# Patient Record
Sex: Female | Born: 1960
Health system: Southern US, Community
[De-identification: ages and names within clinical notes are randomized; demographics above are authoritative.]

## PROBLEM LIST (undated history)

## (undated) DIAGNOSIS — F419 Anxiety disorder, unspecified: Secondary | ICD-10-CM

## (undated) DIAGNOSIS — F32A Depression, unspecified: Secondary | ICD-10-CM

## (undated) DIAGNOSIS — F329 Major depressive disorder, single episode, unspecified: Secondary | ICD-10-CM

## (undated) DIAGNOSIS — K219 Gastro-esophageal reflux disease without esophagitis: Secondary | ICD-10-CM

## (undated) DIAGNOSIS — C50911 Malignant neoplasm of unspecified site of right female breast: Secondary | ICD-10-CM

## (undated) DIAGNOSIS — C50919 Malignant neoplasm of unspecified site of unspecified female breast: Secondary | ICD-10-CM

## (undated) DIAGNOSIS — E119 Type 2 diabetes mellitus without complications: Secondary | ICD-10-CM

## (undated) DIAGNOSIS — T7840XA Allergy, unspecified, initial encounter: Secondary | ICD-10-CM

## (undated) DIAGNOSIS — C50412 Malignant neoplasm of upper-outer quadrant of left female breast: Secondary | ICD-10-CM

## (undated) DIAGNOSIS — M1990 Unspecified osteoarthritis, unspecified site: Secondary | ICD-10-CM

## (undated) DIAGNOSIS — C50912 Malignant neoplasm of unspecified site of left female breast: Secondary | ICD-10-CM

## (undated) DIAGNOSIS — I10 Essential (primary) hypertension: Secondary | ICD-10-CM

## (undated) HISTORY — DX: Malignant neoplasm of unspecified site of unspecified female breast: C50.919

## (undated) HISTORY — DX: Malignant neoplasm of upper-outer quadrant of left female breast: C50.412

## (undated) HISTORY — DX: Depression, unspecified: F32.A

## (undated) HISTORY — DX: Type 2 diabetes mellitus without complications: E11.9

## (undated) HISTORY — DX: Allergy, unspecified, initial encounter: T78.40XA

## (undated) HISTORY — DX: Unspecified osteoarthritis, unspecified site: M19.90

## (undated) HISTORY — DX: Anxiety disorder, unspecified: F41.9

## (undated) HISTORY — DX: Major depressive disorder, single episode, unspecified: F32.9

---

## 1982-08-06 HISTORY — PX: TUBAL LIGATION: SHX77

## 1988-08-06 HISTORY — PX: ABDOMINAL HYSTERECTOMY: SHX81

## 2010-08-27 ENCOUNTER — Encounter: Payer: Self-pay | Admitting: Internal Medicine

## 2013-10-13 ENCOUNTER — Emergency Department (HOSPITAL_COMMUNITY): Payer: No Typology Code available for payment source

## 2013-10-13 ENCOUNTER — Encounter (HOSPITAL_COMMUNITY): Payer: Self-pay | Admitting: Emergency Medicine

## 2013-10-13 ENCOUNTER — Emergency Department (HOSPITAL_COMMUNITY)
Admission: EM | Admit: 2013-10-13 | Discharge: 2013-10-13 | Disposition: A | Discharge status: Home / Self Care | Payer: No Typology Code available for payment source | Attending: Emergency Medicine | Admitting: Emergency Medicine

## 2013-10-13 DIAGNOSIS — S59919A Unspecified injury of unspecified forearm, initial encounter: Secondary | ICD-10-CM

## 2013-10-13 DIAGNOSIS — Z791 Long term (current) use of non-steroidal anti-inflammatories (NSAID): Secondary | ICD-10-CM | POA: Insufficient documentation

## 2013-10-13 DIAGNOSIS — Y998 Other external cause status: Secondary | ICD-10-CM | POA: Insufficient documentation

## 2013-10-13 DIAGNOSIS — Z79899 Other long term (current) drug therapy: Secondary | ICD-10-CM | POA: Insufficient documentation

## 2013-10-13 DIAGNOSIS — S6990XA Unspecified injury of unspecified wrist, hand and finger(s), initial encounter: Secondary | ICD-10-CM | POA: Insufficient documentation

## 2013-10-13 DIAGNOSIS — S069XAA Unspecified intracranial injury with loss of consciousness status unknown, initial encounter: Secondary | ICD-10-CM | POA: Insufficient documentation

## 2013-10-13 DIAGNOSIS — S59909A Unspecified injury of unspecified elbow, initial encounter: Secondary | ICD-10-CM | POA: Insufficient documentation

## 2013-10-13 DIAGNOSIS — F17200 Nicotine dependence, unspecified, uncomplicated: Secondary | ICD-10-CM | POA: Insufficient documentation

## 2013-10-13 DIAGNOSIS — I10 Essential (primary) hypertension: Secondary | ICD-10-CM | POA: Insufficient documentation

## 2013-10-13 DIAGNOSIS — S069X9A Unspecified intracranial injury with loss of consciousness of unspecified duration, initial encounter: Secondary | ICD-10-CM | POA: Insufficient documentation

## 2013-10-13 DIAGNOSIS — S199XXA Unspecified injury of neck, initial encounter: Secondary | ICD-10-CM

## 2013-10-13 DIAGNOSIS — S0993XA Unspecified injury of face, initial encounter: Secondary | ICD-10-CM | POA: Insufficient documentation

## 2013-10-13 DIAGNOSIS — S06890A Other specified intracranial injury without loss of consciousness, initial encounter: Secondary | ICD-10-CM

## 2013-10-13 DIAGNOSIS — IMO0002 Reserved for concepts with insufficient information to code with codable children: Secondary | ICD-10-CM | POA: Insufficient documentation

## 2013-10-13 DIAGNOSIS — H5789 Other specified disorders of eye and adnexa: Secondary | ICD-10-CM | POA: Insufficient documentation

## 2013-10-13 HISTORY — DX: Essential (primary) hypertension: I10

## 2013-10-13 MED ORDER — ATENOLOL-CHLORTHALIDONE 50-25 MG PO TABS: 1.0000 | ORAL_TABLET | Freq: Every day | ORAL | Status: DC

## 2013-10-13 MED ORDER — HYDROCODONE-ACETAMINOPHEN 5-325 MG PO TABS: 1.0000 | ORAL_TABLET | Freq: Four times a day (QID) | ORAL | Status: DC | PRN

## 2013-10-13 MED ORDER — OXYCODONE-ACETAMINOPHEN 5-325 MG PO TABS
2.0000 | ORAL_TABLET | Freq: Once | ORAL | Status: AC
  Administered 2013-10-13: 2 via ORAL
  Filled 2013-10-13: qty 2

## 2013-10-13 MED ORDER — ATENOLOL 25 MG PO TABS
50.0000 mg | ORAL_TABLET | Freq: Every day | ORAL | Status: DC
  Administered 2013-10-13: 50 mg via ORAL
  Filled 2013-10-13: qty 2

## 2013-10-13 MED ORDER — CHLORTHALIDONE 25 MG PO TABS
25.0000 mg | ORAL_TABLET | Freq: Every day | ORAL | Status: DC
  Administered 2013-10-13: 25 mg via ORAL
  Filled 2013-10-13: qty 1
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
## 2013-10-13 NOTE — ED Notes (Signed)
Pt asking if she can receive a prescription for her blood pressure medications.

## 2013-10-13 NOTE — ED Notes (Signed)
Pt states she took 5 advil for the pain. Pt states the pain kept her up. Pt states pain is mainly in the wrists, knees, and cervical spine.

## 2013-10-13 NOTE — ED Notes (Signed)
Patient transported to CT 
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
## 2013-10-23 ENCOUNTER — Encounter (HOSPITAL_COMMUNITY): Payer: Self-pay | Admitting: Emergency Medicine

## 2013-10-23 ENCOUNTER — Emergency Department (HOSPITAL_COMMUNITY): Payer: Self-pay

## 2013-10-23 ENCOUNTER — Emergency Department (HOSPITAL_COMMUNITY)
Admission: EM | Admit: 2013-10-23 | Discharge: 2013-10-24 | Disposition: A | Discharge status: Home / Self Care | Payer: Self-pay | Attending: Emergency Medicine | Admitting: Emergency Medicine

## 2013-10-23 DIAGNOSIS — R5383 Other fatigue: Secondary | ICD-10-CM

## 2013-10-23 DIAGNOSIS — F17200 Nicotine dependence, unspecified, uncomplicated: Secondary | ICD-10-CM | POA: Insufficient documentation

## 2013-10-23 DIAGNOSIS — Z91040 Latex allergy status: Secondary | ICD-10-CM | POA: Insufficient documentation

## 2013-10-23 DIAGNOSIS — M542 Cervicalgia: Secondary | ICD-10-CM | POA: Insufficient documentation

## 2013-10-23 DIAGNOSIS — F0781 Postconcussional syndrome: Secondary | ICD-10-CM | POA: Insufficient documentation

## 2013-10-23 DIAGNOSIS — Z79899 Other long term (current) drug therapy: Secondary | ICD-10-CM | POA: Insufficient documentation

## 2013-10-23 DIAGNOSIS — R5381 Other malaise: Secondary | ICD-10-CM | POA: Insufficient documentation

## 2013-10-23 DIAGNOSIS — I10 Essential (primary) hypertension: Secondary | ICD-10-CM | POA: Insufficient documentation

## 2013-10-23 MED ORDER — HYDROCODONE-ACETAMINOPHEN 5-325 MG PO TABS
1.0000 | ORAL_TABLET | Freq: Once | ORAL | Status: AC
  Administered 2013-10-23: 1 via ORAL
  Filled 2013-10-23: qty 1
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
## 2013-10-23 NOTE — ED Notes (Signed)
Pt ambulated to restroom with steady gait.  No dizziness reported by pt.
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
## 2013-10-24 MED ORDER — HYDROCODONE-ACETAMINOPHEN 5-325 MG PO TABS: 1.0000 | ORAL_TABLET | Freq: Four times a day (QID) | ORAL | Status: DC | PRN

## 2013-10-24 NOTE — ED Provider Notes (Signed)
Medical screening examination/treatment/procedure(s) were performed by non-physician practitioner and as supervising physician I was immediately available for consultation/collaboration.   EKG Interpretation None       Teressa Lower, MD 10/24/13 [phone]

## 2013-10-24 NOTE — Discharge Instructions (Signed)
Your head CT scan shows no acute changes.  There is a lesion which may be a meningioma.  This is likely benign but please find a primary care provider and request an outpatient MRI for further evaluation.  Follow instruction below.  Take pain medication as needed.  Avoid any activity that can cause head trauma.    Post-Concussion Syndrome Post-concussion syndrome describes the symptoms that can occur after a head injury. These symptoms can last from weeks to months. CAUSES  It is not clear why some head injuries cause post-concussion syndrome. It can occur whether your head injury was mild or severe and whether you were wearing head protection or not.  SIGNS AND SYMPTOMS  Memory difficulties.  Dizziness.  Headaches.  Double vision or blurry vision.  Sensitivity to light.  Hearing difficulties.  Depression.  Tiredness.  Weakness.  Difficulty with concentration.  Difficulty sleeping or staying asleep.  Vomiting.  Poor balance or instability on your feet.  Slow reaction time.  Difficulty learning and remembering things you have heard. DIAGNOSIS  There is no test to determine whether you have post-concussion syndrome. Your health care provider may order an imaging scan of your brain, such as a CT scan, to check for other problems that may be causing your symptoms (such as severe injury inside your skull). TREATMENT  Usually, these problems disappear over time without medical care. Your health care provider may prescribe medicine to help ease your symptoms. It is important to follow up with a neurologist to evaluate your recovery and address any lingering symptoms or issues. HOME CARE INSTRUCTIONS   Only take over-the-counter or prescription medicines for pain, discomfort, or fever as directed by your health care provider. Do not take aspirin. Aspirin can slow blood clotting.  Sleep with your head slightly elevated to help with headaches.  Avoid any situation where there is  potential for another head injury (football, hockey, soccer, basketball, martial arts, downhill snow sports, and horseback riding). Your condition will get worse every time you experience a concussion. You should avoid these activities until you are evaluated by the appropriate follow-up health care providers.  Keep all follow-up appointments as directed by your health care provider. SEEK IMMEDIATE MEDICAL CARE IF:  You develop confusion or unusual drowsiness.  You cannot wake the injured person.  You develop nausea or persistent, forceful vomiting.  You feel like you are moving when you are not (vertigo).  You notice the injured person's eyes moving rapidly back and forth. This may be a sign of vertigo.  You have convulsions or faint.  You have severe, persistent headaches that are not relieved by medicine.  You cannot use your arms or legs normally.  Your pupils change size.  You have clear or bloody discharge from the nose or ears.  Your problems are getting worse, not better. MAKE SURE YOU:  Understand these instructions.  Will watch your condition.  Will get help right away if you are not doing well or get worse. Document Released: 01/12/2002 Document Revised: 05/13/2013 Document Reviewed: 02/08/2011 [facility] Patient Information 2014 Margaret [name].   Emergency Department Resource Guide 1) Find a Doctor and Pay Out of Pocket Although you won't have to find out who is covered by your insurance plan, it is a good idea to ask around and get recommendations. You will then need to call the office and see if the doctor you have chosen will accept you as a new patient and what types of options they offer for patients  who are self-pay. Some doctors offer discounts or will set up payment plans for their patients who do not have insurance, but you will need to ask so you aren't surprised when you get to your appointment.  2) Contact Your Local Health Department Not all health  departments have doctors that can see patients for sick visits, but many do, so it is worth a call to see if yours does. If you don't know where your local health department is, you can check in your phone book. The CDC also has a tool to help you locate your state's health department, and many state websites also have listings of all of their local health departments.  3) Find a [facility] If your illness is not likely to be very severe or complicated, you may want to try a walk in clinic. These are popping up all over the country in pharmacies, drugstores, and shopping centers. They're usually staffed by nurse practitioners or physician assistants that have been trained to treat common illnesses and complaints. They're usually fairly quick and inexpensive. However, if you have serious medical issues or chronic medical problems, these are probably not your best option.  No Primary Care Doctor: - Call Health Connect at  [phone] - they can help you locate a primary care doctor that  accepts your insurance, provides certain services, etc. - Physician Referral Service- [phone]  Chronic Pain Problems: Organization         Address  Phone   Notes  [facility]  [phone] Patients need to be referred by their primary care doctor.   Medication Assistance: Organization         Address  Phone   Notes  [facility] Medication [facility] [place], [place] [address] [phone] --Must be a resident of [facility] -- Must have NO insurance coverage whatsoever (no Medicaid/ Medicare, etc.) -- The pt. MUST have a primary care doctor that directs their care regularly and follows them in the community   [facility]ist  [phone]   [employer]  [phone]    Agencies that provide inexpensive medical care: Organization         Address  Phone   Notes  [place]  [phone]   [facility]     [phone]   [facility],  [phone]   [place] [address], [place] [phone]   Planned Parenthood    [phone]   [facility]    [phone]   [place] and [address], [place] Phone:  [phone], Fax:  [phone] Hours of Operation:  9 am - 6 pm, M-F.  Also accepts Medicaid/Medicare and self-pay.  [facility] for [place] [address], [address], [place] Phone: [phone], Fax: [phone]. Hours of Operation:  8:30 am - 5:30 pm, M-F.  Also accepts Medicaid and self-pay.  [facility] [place] [address], [place] Phone: [phone]   [place], [place], [place] [phone], Ext. [phone] Mondays & Thursdays: 7-9 AM.  First 15 patients are seen on a first come, first serve basis.    [place] Providers:  Organization         Address  Phone   Notes  [facility] [address], [place] [phone] Also accepts self-pay patients.  [facility], [place] [id number] [place], [place]  [phone]   [place], [address], [place] [phone]   [facility] [address], [place] [phone]   Lucianne Lei [address], [place]   [phone] Only accepts [place] Access [place] patients after they have their name applied to their card.   Self-Pay (no insurance) in [facility]:  Organization         Address  Phone   Notes  Sickle Cell Patients, [facility] Internal Medicine [place] [phone]   [facility] [place] [phone]   [facility] [place]  [place], [place], [place] [phone]     [facility]/Dr. Osei-Bonsu  [address] [place] or [address], [place] [phone] Phone number for both [place] and [place] locations is the same.  [facility]gent [facility] and [facility] [address] [place] [phone]   [facility] [address] [place] or [address] [phone]   [facility] [address] [place] [phone], phone; [phone], fax Sees patients 1st and 3rd Saturday of every month.  Must not qualify for public or private insurance (i.e. Medicaid, Medicare, [place] Health Choice, Veterans' Benefits)  Household income should be no more than 200% of the poverty level The clinic cannot treat you if you are pregnant or think you are pregnant  Sexually transmitted diseases are not treated at the clinic.    Dental Care: Organization         Address  Phone  Notes  [facility] Department of [facility] [place] [phone] Accepts children up to age 80 who are enrolled in [place] or [place]; pregnant women with a Medicaid card; and children who have applied for Medicaid or [place] Health Choice, but were declined, whose parents can pay a reduced fee at time of service.  [facility] of [facility]  [address], [place] [phone] Accepts children up to age 71 who are enrolled in [place] or [place]; pregnant women with a Medicaid card; and children who have applied for Medicaid or  Health Choice, but were declined, whose parents can pay a reduced fee at time of service.  [place] Adult Dental Access PROGRAM  [place] [phone] Patients are seen by appointment only. Walk-ins are not accepted. [place] will see patients 50 years of age and older. Monday - Tuesday (8am-5pm) Most Wednesdays (8:30-5pm) $30 per visit,  cash only  [facility] Adult Dental Access PROGRAM  [address], [facility] [phone] Patients are seen by appointment only. Walk-ins are not accepted. [place] will see patients 65 years of age and older. One Wednesday Evening (Monthly: Volunteer Based).  $30 per visit, cash only  [place]  [phone] for adults; Children under age 86, call Graduate Pediatric Dentistry at [phone]. Children aged 56-14, please call [phone] to request a pediatric application.  Dental services are provided in all areas of dental care including fillings, crowns and bridges, complete and partial dentures, implants, gum treatment,  root canals, and extractions. Preventive care is also provided. Treatment is provided to both adults and children. Patients are selected via a lottery and there is often a waiting list.   [facility] [address], [place]  [phone] [email or web address]   [facility] [address] [place], [place] [phone], Ext. [phone] Second and Fourth Thursday of each month, opens at 6:30 AM; Clinic ends at 9 AM.  Patients are seen on a first-come first-served basis, and a limited number are seen during each clinic.   [facility]  [address] Hillard Danker [place], [place] [phone]   Eligibility Requirements You must have lived in [place], [place], or [place] counties for at least the last three months.   You cannot be eligible for state or federal sponsored [employer], including [employer], [place], or [employer].   You generally cannot be eligible for healthcare insurance through your employer.    How to apply: Eligibility screenings are held every Tuesday and Wednesday afternoon from 1:00 pm until 4:00 pm. You do not need an appointment for the interview!  [facility] [address], [place], [place]   [place]   [place] Department  [place]  [phone]    Behavioral Health Resources in the Community: Intensive Outpatient Programs Organization         Address  Phone  Notes  [facility] [place]. [address] [place], [place] [phone]   [facility] Outpatient [address], [place], [place]   ADS: Alcohol & Drug Svcs [address], [place], [place]   [place] [address]  [place], [place] or [phone]   Substance Abuse Resources Organization         Address  Phone  Notes  Alcohol and Drug Services  [phone]   [place]  [phone]   [facility] [place]   Chinita Pester  [phone]   Residential & Outpatient Substance Abuse Program  [phone]   Psychological Services Organization         Address  Phone  Notes  [facility] [place]  [place]  [phone]   [place] [address], [place] or [phone]    Mobile Crisis Teams Organization         Address  Phone  Notes  [facility], Mobile Crisis Care Unit  [phone]   [facility]  [address] [place], [place]   [place] [address] [place] [phone]    Self-Help/Support Groups Organization         Address  Phone             Notes  [place]. of [place] - variety of support groups  [place] Call for more information  Narcotics Anonymous (NA), [facility] [address], [employer] [place]  2 meetings at this location   Special educational needs teacher         Address  Phone  Notes  [facility] Residential Treatment [place],    [place]  [place]  [address], [place], [place], [place]    [place], [place] [phone] Admissions: 8am-3pm M-F  Incentives Substance Abuse Treatment  Center [address]    [place], [place] [phone]   [facility] [address] [place], [place], [place]   [facility] [address],  [place], [place]   [facility] Programs - Intensive Outpatient [address], [place], [place]   [facility] ([place].) [place].,  [place], [place] [phone] or [phone]   [facility]idential Treatment Services (RTS) [address] [place], [place] Accepts Medicaid  [facility] [place] [address],  [place] [phone])[phone] Substance Abuse/Addiction Treatment   [facility] Organization         Address  Phone  Notes  [facility]  [phone]   Domenic Schwab, PhD [address] Arlis [place], [place]   [phone] or [phone]   [place], [place] [phone]   [facility] [address] [place], [place] [phone] Insurance/Medicaid/sponsorship through [facility] and Families [address], [address]                                    [place], [place] [phone] [place] [address], [place] [phone]    Dr. Adele Schilder  [phone]   [facility] Dept. 1) [address] [place] 2) [place] 3)  [place] [address], [place] [phone]  [phone]   [place] [phone] or [phone] (After Hours)

## 2014-03-13 ENCOUNTER — Other Ambulatory Visit (HOSPITAL_COMMUNITY): Payer: Self-pay

## 2014-03-13 ENCOUNTER — Encounter (HOSPITAL_COMMUNITY): Payer: Self-pay | Admitting: Emergency Medicine

## 2014-03-13 ENCOUNTER — Emergency Department (HOSPITAL_COMMUNITY)
Admission: EM | Admit: 2014-03-13 | Discharge: 2014-03-13 | Disposition: A | Discharge status: Home / Self Care | Payer: Self-pay | Attending: Emergency Medicine | Admitting: Emergency Medicine

## 2014-03-13 ENCOUNTER — Emergency Department (HOSPITAL_COMMUNITY): Payer: Self-pay

## 2014-03-13 DIAGNOSIS — K047 Periapical abscess without sinus: Secondary | ICD-10-CM | POA: Insufficient documentation

## 2014-03-13 DIAGNOSIS — R21 Rash and other nonspecific skin eruption: Secondary | ICD-10-CM | POA: Insufficient documentation

## 2014-03-13 DIAGNOSIS — K029 Dental caries, unspecified: Secondary | ICD-10-CM | POA: Insufficient documentation

## 2014-03-13 DIAGNOSIS — F17200 Nicotine dependence, unspecified, uncomplicated: Secondary | ICD-10-CM | POA: Insufficient documentation

## 2014-03-13 DIAGNOSIS — Z91040 Latex allergy status: Secondary | ICD-10-CM | POA: Insufficient documentation

## 2014-03-13 DIAGNOSIS — Z791 Long term (current) use of non-steroidal anti-inflammatories (NSAID): Secondary | ICD-10-CM | POA: Insufficient documentation

## 2014-03-13 DIAGNOSIS — I10 Essential (primary) hypertension: Secondary | ICD-10-CM | POA: Insufficient documentation

## 2014-03-13 DIAGNOSIS — H9209 Otalgia, unspecified ear: Secondary | ICD-10-CM | POA: Insufficient documentation

## 2014-03-13 LAB — CBC WITH DIFFERENTIAL/PLATELET
Basophils Absolute: 0 10*3/uL (ref 0.0–0.1)
Basophils Relative: 0 % (ref 0–1)
Eosinophils Absolute: 0.1 10*3/uL (ref 0.0–0.7)
Eosinophils Relative: 1 % (ref 0–5)
HCT: 44.5 % (ref 36.0–46.0)
Hemoglobin: 14.8 g/dL (ref 12.0–15.0)
Lymphocytes Relative: 29 % (ref 12–46)
Lymphs Abs: 4 10*3/uL (ref 0.7–4.0)
MCH: 30 pg (ref 26.0–34.0)
MCHC: 33.3 g/dL (ref 30.0–36.0)
MCV: 90.3 fL (ref 78.0–100.0)
Monocytes Absolute: 1.4 10*3/uL — ABNORMAL HIGH (ref 0.1–1.0)
Monocytes Relative: 10 % (ref 3–12)
Neutro Abs: 8.3 10*3/uL — ABNORMAL HIGH (ref 1.7–7.7)
Neutrophils Relative %: 60 % (ref 43–77)
Platelets: 223 10*3/uL (ref 150–400)
RBC: 4.93 MIL/uL (ref 3.87–5.11)
RDW: 14.2 % (ref 11.5–15.5)
WBC: 13.8 10*3/uL — ABNORMAL HIGH (ref 4.0–10.5)

## 2014-03-13 LAB — BASIC METABOLIC PANEL
Anion gap: 14 (ref 5–15)
BUN: 10 mg/dL (ref 6–23)
CO2: 22 mEq/L (ref 19–32)
Calcium: 9.2 mg/dL (ref 8.4–10.5)
Chloride: 102 mEq/L (ref 96–112)
Creatinine, Ser: 0.72 mg/dL (ref 0.50–1.10)
GFR calc Af Amer: >90 mL/min (ref >90)
GFR calc non Af Amer: >90 mL/min (ref >90)
Glucose, Bld: 94 mg/dL (ref 70–99)
Potassium: 3.7 mEq/L (ref 3.7–5.3)
Sodium: 138 mEq/L (ref 137–147)

## 2014-03-13 MED ORDER — PENICILLIN V POTASSIUM 500 MG PO TABS: 500.0000 mg | ORAL_TABLET | Freq: Three times a day (TID) | ORAL | Status: DC

## 2014-03-13 MED ORDER — POLYMYXIN B-TRIMETHOPRIM 10000-0.1 UNIT/ML-% OP SOLN: 2.0000 [drp] | OPHTHALMIC | Status: DC

## 2014-03-13 MED ORDER — HYDROMORPHONE HCL PF 1 MG/ML IJ SOLN
1.0000 mg | Freq: Once | INTRAMUSCULAR | Status: AC
  Administered 2014-03-13: 1 mg via INTRAVENOUS
  Filled 2014-03-13: qty 1

## 2014-03-13 MED ORDER — PENICILLIN V POTASSIUM 250 MG PO TABS
500.0000 mg | ORAL_TABLET | Freq: Once | ORAL | Status: AC
  Administered 2014-03-13: 500 mg via ORAL
  Filled 2014-03-13: qty 2

## 2014-03-13 MED ORDER — ONDANSETRON HCL 4 MG/2ML IJ SOLN
4.0000 mg | Freq: Once | INTRAMUSCULAR | Status: AC
  Administered 2014-03-13: 4 mg via INTRAVENOUS
  Filled 2014-03-13: qty 2

## 2014-03-13 MED ORDER — OXYCODONE-ACETAMINOPHEN 5-325 MG PO TABS: 1.0000 | ORAL_TABLET | Freq: Four times a day (QID) | ORAL | Status: DC | PRN

## 2014-03-13 MED ORDER — OXYCODONE-ACETAMINOPHEN 5-325 MG PO TABS
1.0000 | ORAL_TABLET | Freq: Once | ORAL | Status: AC
  Administered 2014-03-13: 1 via ORAL
  Filled 2014-03-13: qty 1

## 2014-03-13 MED ORDER — IOHEXOL 300 MG/ML  SOLN
75.0000 mL | Freq: Once | INTRAMUSCULAR | Status: AC | PRN
  Administered 2014-03-13: 75 mL via INTRAVENOUS
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
## 2014-03-13 NOTE — Discharge Instructions (Signed)
Please read and follow all provided instructions.  Your diagnoses today include:  1. Dental abscess   2. Rash     The exam and treatment you received today has been provided on an emergency basis only. This is not a substitute for complete medical or dental care.  Tests performed today include:  Vital signs. See below for your results today.   Medications prescribed:   Penicillin - antibiotic  You have been prescribed an antibiotic medicine: take the entire course of medicine even if you are feeling better. Stopping early can cause the antibiotic not to work.   Percocet (oxycodone/acetaminophen) - narcotic pain medication  DO NOT drive or perform any activities that require you to be awake and alert because this medicine can make you drowsy. BE VERY CAREFUL not to take multiple medicines containing Tylenol (also called acetaminophen). Doing so can lead to an overdose which can damage your liver and cause liver failure and possibly death.  Take any prescribed medications only as directed.  Home care instructions:  Follow any educational materials contained in this packet.  Follow-up instructions: Please follow-up with your dentist for further evaluation of your symptoms.   Dental Assistance: See below for dental referrals  Return instructions:   Please return to the Emergency Department if you experience worsening symptoms.  Please return if you develop a fever, you develop more swelling in your face or neck, you have trouble breathing or swallowing food.  Please return if you have any other emergent concerns.  Additional Information:  Your vital signs today were: BP 193/91   Pulse 58   Temp(Src) 98.7 F (37.1 C) (Oral)   Resp 18   Ht 5\' 9"  (1.753 m)   Wt 240 lb (108.863 kg)   BMI 35.43 kg/m2   SpO2 96% If your blood pressure (BP) was elevated above 135/85 this visit, please have this repeated by your doctor within one month. -------------- Dental  Care: Organization         Address  Phone  Notes  [facility] Department of [facility] [place] [phone] Accepts children up to age 20 who are enrolled in [place] or [place]; pregnant women with a Medicaid card; and children who have applied for Medicaid or [place] Health Choice, but were declined, whose parents can pay a reduced fee at time of service.  [facility] Department of [facility]  [address], [place] [phone] Accepts children up to age 8 who are enrolled in [place] or [place]; pregnant women with a Medicaid card; and children who have applied for Medicaid or [place] Health Choice, but were declined, whose parents can pay a reduced fee at time of service.  [place] Adult Dental Access PROGRAM  [place] [phone] Patients are seen by appointment only. Walk-ins are not accepted. [place] will see patients 55 years of age and older. Monday - Tuesday (8am-5pm) Most Wednesdays (8:30-5pm) $30 per visit, cash only  [facility] Adult Dental Access PROGRAM  [address], [facility] [phone] Patients are seen by appointment only. Walk-ins are not accepted. [place] will see patients 90 years of age and older. One Wednesday Evening (Monthly: Volunteer Based).  $30 per visit, cash only  [place]  [phone] for adults; Children under age 63, call Graduate Pediatric Dentistry at [phone]. Children aged 71-14, please call [phone] to  request a pediatric application.  Dental services are provided in all areas of dental care including fillings, crowns and bridges, complete and partial dentures, implants, gum treatment, root canals, and extractions. Preventive care is also provided. Treatment is provided to both adults and children. Patients are selected via a lottery and there is often a waiting list.   [facility] [address] [place]  [phone] [email or web address]   [facility] [address] [place], [place] [phone], Ext. [phone] Second and Fourth Thursday of each month, opens at 6:30 AM; Clinic ends at 9 AM.  Patients are seen on a first-come first-served basis, and a limited number are seen during each clinic.   [facility]  [address] Hillard Danker [place], [place] [phone]   Eligibility Requirements You must have lived in [place], [place], or [place] counties for at least the last three months.   You cannot be eligible for state or federal sponsored [employer], including [employer], [place], or [employer].   You generally cannot be eligible for healthcare insurance through your employer.    How to apply: Eligibility screenings are held every Tuesday and Wednesday afternoon from 1:00 pm until 4:00 pm. You do not need an appointment for the interview!  [facility] [address] [place], [place]   [place]  [place]  [place]  [phone]

## 2014-03-13 NOTE — ED Provider Notes (Signed)
CSN: [id number]     Arrival date & time 03/13/14  9767 History   First MD Initiated Contact with Patient 03/13/14 1054     Chief Complaint  Patient presents with  . Abscess     (Consider location/radiation/quality/duration/timing/severity/associated sxs/prior Treatment) HPI Comments: Patient with no significant PMH (no HIV, DM, immunosuppressants) -- presents with c/o skin lesion on neck and facial pain associated with swelling inside mouth. Symptoms started yesterday and have progressively worsened. No fever, N/V/D. She complains of eye redness and bilateral ear pains. Not relieved with ibuprofen. The onset of this condition was acute. The course is constant. Aggravating factors: none. Alleviating factors: none.    Patient is a 53 y.o. female presenting with abscess. The history is provided by the patient.  Abscess Associated symptoms: no fever, no headaches, no nausea and no vomiting     Past Medical History  Diagnosis Date  . Hypertension    History reviewed. No pertinent past surgical history. No family history on file. History  Substance Use Topics  . Smoking status: Current Every Day Smoker -- 0.50 packs/day    Types: Cigarettes  . Smokeless tobacco: Not on file  . Alcohol Use: Yes     Comment: socially   OB History   Grav Para Term Preterm Abortions TAB SAB Ect Mult Living                 Review of Systems  Constitutional: Negative for fever.  HENT: Positive for ear pain and facial swelling. Negative for dental problem, rhinorrhea and sore throat.   Eyes: Positive for redness.  Respiratory: Negative for cough.   Cardiovascular: Negative for chest pain.  Gastrointestinal: Negative for nausea, vomiting, abdominal pain and diarrhea.  Genitourinary: Negative for dysuria.  Musculoskeletal: Negative for myalgias.  Skin: Positive for rash.  Neurological: Negative for headaches.   Allergies  Latex  Home Medications   Prior to Admission medications   Medication Sig  Start Date End Date Taking? Authorizing Provider  ibuprofen (ADVIL,MOTRIN) 200 MG tablet Take 800-1,000 mg by mouth 3 (three) times daily as needed for headache.    Yes Historical Provider, MD   BP 159/82  Pulse 64  Temp(Src) 98.7 F (37.1 C) (Oral)  Resp 18  Ht 5\' 9"  (1.753 m)  Wt 240 lb (108.863 kg)  BMI 35.43 kg/m2  SpO2 100%  Physical Exam  Nursing note and vitals reviewed. Constitutional: She appears well-developed and well-nourished.  HENT:  Head: Atraumatic. Macrocephalic.  Right Ear: External ear and ear canal normal.  Left Ear: External ear and ear canal normal.  Nose: No mucosal edema or rhinorrhea.  Mouth/Throat: No oral lesions. No trismus in the jaw. Dental abscesses and dental caries present. No uvula swelling. No oropharyngeal exudate, posterior oropharyngeal edema or posterior oropharyngeal erythema.    Bilateral ear canals with much debris, portions of visible TM appear normal. There is a 2cm x 1cm swelling left palate. Area is tender.   Eyes: Right eye exhibits no chemosis and no discharge. Left eye exhibits no chemosis and no discharge. Right conjunctiva is injected. Left conjunctiva is injected.  Neck: Normal range of motion. Neck supple.  Cardiovascular: Normal rate, regular rhythm and normal heart sounds.   No murmur heard. Pulmonary/Chest: Effort normal and breath sounds normal.  Abdominal: Soft. There is no tenderness.  Lymphadenopathy:    She has no cervical adenopathy.  Neurological: She is alert.  Skin: Skin is warm and dry.  Small area of vesicular rash noted posterior  neck. No cellulitis. Minimally tender.   Psychiatric: She has a normal mood and affect.    ED Course  Procedures (including critical care time) Labs Review Labs Reviewed  CBC WITH DIFFERENTIAL - Abnormal; Notable for the following:    WBC 13.8 (*)    Neutro Abs 8.3 (*)    Monocytes Absolute 1.4 (*)    All other components within normal limits  BASIC METABOLIC PANEL     Imaging Review Ct Maxillofacial W/cm  03/13/2014   CLINICAL DATA:  Evaluate for deep palatal abscess or infection. Tooth pain.  EXAM: CT MAXILLOFACIAL WITH CONTRAST  TECHNIQUE: Multidetector CT imaging of the maxillofacial structures was performed with intravenous contrast. Multiplanar CT image reconstructions were also generated. A small metallic BB was placed on the right temple in order to reliably differentiate right from left.  CONTRAST:  56mL OMNIPAQUE IOHEXOL 300 MG/ML  SOLN  COMPARISON:  None.  FINDINGS: Mandible: BILATERAL absent first molars probably extracted. Small dental caries RIGHT wisdom tooth with periapical lucency suggesting infection.  Maxilla: Extensive dental caries of the LEFT third molar wisdom tooth. Large periapical abscess in the bone with floating roots. Small extraosseous inflammatory collection extends laterally without involvement of the pterygoid muscles or pterygoid plates as seen on image 40 series 2 and measures 16 x 4 mm. Similar inflammatory process extends posteriorly on the LEFT. Significant periodontal disease throughout the other LEFT-sided maxillary teeth, particularly the LEFT first premolar. No RIGHT maxillary tooth periapical abscess. Retroantral fat preserved. At this time, no concern for spread to the pterygopalatine fossa.  Nasopharynx: Intact hard palate. Soft palate may be edematous. Mild palatine and nasopharyngeal adenoidal hypertrophy.  Facial bones:  Otherwise intact.  Other: No middle ear or maxillary fluid. No acute paranasal sinus disease. Mild chronic mucosal thickening LEFT greater than RIGHT maxillary sinuses. Nasal septal deviation RIGHT to LEFT of 3 mm.  Orbits: Negative.  IMPRESSION: Extensive periodontal disease with most notable area of involvement in the LEFT maxillary third molar where dental caries and periapical abscess extends to the surrounding soft tissues. See discussion above.   Electronically Signed   By: Rolla Flatten M.D.   On:  03/13/2014 14:27     EKG Interpretation None      12:01 PM Patient seen and examined. Work-up initiated. Medications ordered. D/w Dr. Leonides Schanz who will see.   Vital signs reviewed and are as follows: BP 159/82  Pulse 64  Temp(Src) 98.7 F (37.1 C) (Oral)  Resp 18  Ht 5\' 9"  (1.753 m)  Wt 240 lb (108.863 kg)  BMI 35.43 kg/m2  SpO2 100%  12:32 PM Patient seen by Dr. Leonides Schanz. CT ordered and pending.   CT reviewed. Findings discussed with Dr. Leonides Schanz. I spoke briefly with ENT who states with these findings -- pt needs to be managed by oral surgery or dentistry.   Patient agrees to attempt at aspiration fo intraoral abscess.   INCISION AND DRAINAGE Performed by: Faustino Congress Consent: Verbal consent obtained. Risks and benefits: risks, benefits and alternatives were discussed Type: abscess  Body area: intraoral, L palate  Anesthesia: local infiltration  Aspiration performed with 18g needle.   Local anesthetic: lidocaine 2% without epinephrine  Anesthetic total: 1 ml  Complexity: complex  Drainage: purulent  Drainage amount: 2cc  Packing material: none  Patient tolerance: Patient tolerated the procedure well with no immediate complications.   Will d/c to home with oral antibiotics, pain medications, and dentistry referral.   The patient was urged to return to  the Emergency Department urgently with worsening pain, swelling, fever, expanding erythema.  Patient counseled on use of narcotic pain medications. Counseled not to combine these medications with others containing tylenol. Urged not to drink alcohol, drive, or perform any other activities that requires focus while taking these medications. The patient verbalizes understanding and agrees with the plan.  The patient verbalized understanding and stated agreement with this plan.    Patient given polytrim eyedrops for conjunctivitis.   Will hold off on treating neck rash as it is unclear as to etiology. Patient  encouraged to return with worsening.   MDM   Final diagnoses:  Dental abscess  Rash   Dental abscess: Patient with intraoral pain and abscess. No fever. Exam unconcerning for Ludwig's angina or other deep tissue infection in neck. CT performed. As there is gum swelling, erythema, and facial swelling, will treat with antibiotic and pain medicine. Urged patient to follow-up with dentist as directed.   Rash: At this point, unclear etiology. ? Shingles versus contact dermatitis. It is not really bothering patient much. At this point will have patient monitor area, return with worsening.        Carlisle Cater, PA-C 03/13/14 1651

## 2014-03-13 NOTE — ED Provider Notes (Signed)
Medical screening examination/treatment/procedure(s) were conducted as a shared visit with non-physician practitioner(s) and myself.  I personally evaluated the patient during the encounter.   EKG Interpretation None      Pt is a 53 y.o. F with no significant past medical history who presents to the emergency department with 3 vesicular lesions on the right posterior neck, swelling and pain over her left hard palate, bilateral scleral injection. No associated fevers, cough, vomiting or diarrhea. No sick contacts.  Labs have mild leukocytosis. CT scan shows a left maxillary third molar periapical abscess that extends into the soft tissues likely causing the palate swelling. This area has been aspirated with a small amount of purulent drainage. Will discharge home on antibiotics for dental abscess. We'll have her followup with a dentist for extraction. We'll also discharge her home on Polytrim drops for bilateral conjunctivitis. Vesicular lesions on the back of her neck do not look like shingles at this time but we'll have her have this closely monitor with her PCP.  [place], DO 03/13/14 1512

## 2014-09-15 ENCOUNTER — Emergency Department (HOSPITAL_COMMUNITY)
Admission: EM | Admit: 2014-09-15 | Discharge: 2014-09-15 | Disposition: A | Discharge status: Home / Self Care | Payer: No Typology Code available for payment source | Source: Home / Self Care | Attending: Family Medicine | Admitting: Family Medicine

## 2014-09-15 ENCOUNTER — Encounter (HOSPITAL_COMMUNITY): Payer: Self-pay | Admitting: *Deleted

## 2014-09-15 DIAGNOSIS — R519 Headache, unspecified: Secondary | ICD-10-CM

## 2014-09-15 DIAGNOSIS — Z87891 Personal history of nicotine dependence: Secondary | ICD-10-CM

## 2014-09-15 DIAGNOSIS — Z720 Tobacco use: Secondary | ICD-10-CM

## 2014-09-15 DIAGNOSIS — E669 Obesity, unspecified: Secondary | ICD-10-CM

## 2014-09-15 DIAGNOSIS — R51 Headache: Secondary | ICD-10-CM

## 2014-09-15 DIAGNOSIS — I10 Essential (primary) hypertension: Secondary | ICD-10-CM

## 2014-09-15 MED ORDER — HYDROCHLOROTHIAZIDE 25 MG PO TABS: 25.0000 mg | ORAL_TABLET | Freq: Every day | ORAL | Status: DC

## 2014-09-15 NOTE — Discharge Instructions (Signed)
Hypertension °Hypertension, commonly called high blood pressure, is when the force of blood pumping through your arteries is too strong. Your arteries are the blood vessels that carry blood from your heart throughout your body. A blood pressure reading consists of a higher number over a lower number, such as 110/72. The higher number (systolic) is the pressure inside your arteries when your heart pumps. The lower number (diastolic) is the pressure inside your arteries when your heart relaxes. Ideally you want your blood pressure below 120/80. °Hypertension forces your heart to work harder to pump blood. Your arteries may become narrow or stiff. Having hypertension puts you at risk for heart disease, stroke, and other problems.  °RISK FACTORS °Some risk factors for high blood pressure are controllable. Others are not.  °Risk factors you cannot control include:  °· Race. You may be at higher risk if you are African American. °· Age. Risk increases with age. °· Gender. Men are at higher risk than women before age 45 years. After age 65, women are at higher risk than men. °Risk factors you can control include: °· Not getting enough exercise or physical activity. °· Being overweight. °· Getting too much fat, sugar, calories, or salt in your diet. °· Drinking too much alcohol. °SIGNS AND SYMPTOMS °Hypertension does not usually cause signs or symptoms. Extremely high blood pressure (hypertensive crisis) may cause headache, anxiety, shortness of breath, and nosebleed. °DIAGNOSIS  °To check if you have hypertension, your health care provider will measure your blood pressure while you are seated, with your arm held at the level of your heart. It should be measured at least twice using the same arm. Certain conditions can cause a difference in blood pressure between your right and left arms. A blood pressure reading that is higher than normal on one occasion does not mean that you need treatment. If one blood pressure reading  is high, ask your health care provider about having it checked again. °TREATMENT  °Treating high blood pressure includes making lifestyle changes and possibly taking medicine. Living a healthy lifestyle can help lower high blood pressure. You may need to change some of your habits. °Lifestyle changes may include: °· Following the DASH diet. This diet is high in fruits, vegetables, and whole grains. It is low in salt, red meat, and added sugars. °· Getting at least 2½ hours of brisk physical activity every week. °· Losing weight if necessary. °· Not smoking. °· Limiting alcoholic beverages. °· Learning ways to reduce stress. ° If lifestyle changes are not enough to get your blood pressure under control, your health care provider may prescribe medicine. You may need to take more than one. Work closely with your health care provider to understand the risks and benefits. °HOME CARE INSTRUCTIONS °· Have your blood pressure rechecked as directed by your health care provider.   °· Take medicines only as directed by your health care provider. Follow the directions carefully. Blood pressure medicines must be taken as prescribed. The medicine does not work as well when you skip doses. Skipping doses also puts you at risk for problems.   °· Do not smoke.   °· Monitor your blood pressure at home as directed by your health care provider.  °SEEK MEDICAL CARE IF:  °· You think you are having a reaction to medicines taken. °· You have recurrent headaches or feel dizzy. °· You have swelling in your ankles. °· You have trouble with your vision. °SEEK IMMEDIATE MEDICAL CARE IF: °· You develop a severe headache or confusion. °·   You have unusual weakness, numbness, or feel faint.  You have severe chest or abdominal pain.  You vomit repeatedly.  You have trouble breathing. MAKE SURE YOU:   Understand these instructions.  Will watch your condition.  Will get help right away if you are not doing well or get worse. Document  Released: 07/23/2005 Document Revised: 12/07/2013 Document Reviewed: 05/15/2013 [facility] Patient Information 2015 [place], [place]. This information is not intended to replace advice given to you by your health care provider. Make sure you discuss any questions you have with your health care provider.  Smoking Cessation Quitting smoking is important to your health and has many advantages. However, it is not always easy to quit since nicotine is a very addictive drug. Oftentimes, people try 3 times or more before being able to quit. This document explains the best ways for you to prepare to quit smoking. Quitting takes hard work and a lot of effort, but you can do it. ADVANTAGES OF QUITTING SMOKING  You will live longer, feel better, and live better.  Your body will feel the impact of quitting smoking almost immediately.  Within 20 minutes, blood pressure decreases. Your pulse returns to its normal level.  After 8 hours, carbon monoxide levels in the blood return to normal. Your oxygen level increases.  After 24 hours, the chance of having a heart attack starts to decrease. Your breath, hair, and body stop smelling like smoke.  After 48 hours, damaged nerve endings begin to recover. Your sense of taste and smell improve.  After 72 hours, the body is virtually free of nicotine. Your bronchial tubes relax and breathing becomes easier.  After 2 to 12 weeks, lungs can hold more air. Exercise becomes easier and circulation improves.  The risk of having a heart attack, stroke, cancer, or lung disease is greatly reduced.  After 1 year, the risk of coronary heart disease is cut in half.  After 5 years, the risk of stroke falls to the same as a nonsmoker.  After 10 years, the risk of lung cancer is cut in half and the risk of other cancers decreases significantly.  After 15 years, the risk of coronary heart disease drops, usually to the level of a nonsmoker.  If you are pregnant, quitting  smoking will improve your chances of having a healthy baby.  The people you live with, especially any children, will be healthier.  You will have extra money to spend on things other than cigarettes. QUESTIONS TO THINK ABOUT BEFORE ATTEMPTING TO QUIT You may want to talk about your answers with your health care provider.  Why do you want to quit?  If you tried to quit in the past, what helped and what did not?  What will be the most difficult situations for you after you quit? How will you plan to handle them?  Who can help you through the tough times? Your family? Friends? A health care provider?  What pleasures do you get from smoking? What ways can you still get pleasure if you quit? Here are some questions to ask your health care provider:  How can you help me to be successful at quitting?  What medicine do you think would be best for me and how should I take it?  What should I do if I need more help?  What is smoking withdrawal like? How can I get information on withdrawal? GET READY  Set a quit date.  Change your environment by getting rid of all cigarettes,  ashtrays, matches, and lighters in your home, car, or work. Do not let people smoke in your home.  Review your past attempts to quit. Think about what worked and what did not. GET SUPPORT AND ENCOURAGEMENT You have a better chance of being successful if you have help. You can get support in many ways.  Tell your family, friends, and coworkers that you are going to quit and need their support. Ask them not to smoke around you.  Get individual, group, or telephone counseling and support. Programs are available at [employer] and health centers. Call your local health department for information about programs in your area.  Spiritual beliefs and practices may help some smokers quit.  Download a "quit meter" on your computer to keep track of quit statistics, such as how long you have gone without smoking,  cigarettes not smoked, and money saved.  Get a self-help book about quitting smoking and staying off tobacco. [place] yourself from urges to smoke. Talk to someone, go for a walk, or occupy your time with a task.  Change your normal routine. Take a different route to work. Drink tea instead of coffee. Eat breakfast in a different place.  Reduce your stress. Take a hot bath, exercise, or read a book.  Plan something enjoyable to do every day. Reward yourself for not smoking.  Explore interactive web-based programs that specialize in helping you quit. GET MEDICINE AND USE IT CORRECTLY Medicines can help you stop smoking and decrease the urge to smoke. Combining medicine with the above behavioral methods and support can greatly increase your chances of successfully quitting smoking.  Nicotine replacement therapy helps deliver nicotine to your body without the negative effects and risks of smoking. Nicotine replacement therapy includes nicotine gum, lozenges, inhalers, nasal sprays, and skin patches. Some may be available over-the-counter and others require a prescription.  Antidepressant medicine helps people abstain from smoking, but how this works is unknown. This medicine is available by prescription.  Nicotinic receptor partial agonist medicine simulates the effect of nicotine in your brain. This medicine is available by prescription. Ask your health care provider for advice about which medicines to use and how to use them based on your health history. Your health care provider will tell you what side effects to look out for if you choose to be on a medicine or therapy. Carefully read the information on the package. Do not use any other product containing nicotine while using a nicotine replacement product.  RELAPSE OR DIFFICULT SITUATIONS Most relapses occur within the first 3 months after quitting. Do not be discouraged if you start smoking again. Remember,  most people try several times before finally quitting. You may have symptoms of withdrawal because your body is used to nicotine. You may crave cigarettes, be irritable, feel very hungry, cough often, get headaches, or have difficulty concentrating. The withdrawal symptoms are only temporary. They are strongest when you first quit, but they will go away within 10-14 days. To reduce the chances of relapse, try to:  Avoid drinking alcohol. Drinking lowers your chances of successfully quitting.  Reduce the amount of caffeine you consume. Once you quit smoking, the amount of caffeine in your body increases and can give you symptoms, such as a rapid heartbeat, sweating, and anxiety.  Avoid smokers because they can make you want to smoke.  Do not let weight gain distract you. Many smokers will gain weight when they quit, usually less than 10 pounds.  Eat a healthy diet and stay active. You can always lose the weight gained after you quit.  Find ways to improve your mood other than smoking. FOR MORE INFORMATION  www.smokefree.gov  Document Released: 07/17/2001 Document Revised: 12/07/2013 Document Reviewed: 11/01/2011 [facility] Patient Information 2015 [place], [place]. This information is not intended to replace advice given to you by your health care provider. Make sure you discuss any questions you have with your health care provider.  Managing Your High Blood Pressure Blood pressure is a measurement of how forceful your blood is pressing against the walls of the arteries. Arteries are muscular tubes within the circulatory system. Blood pressure does not stay the same. Blood pressure rises when you are active, excited, or nervous; and it lowers during sleep and relaxation. If the numbers measuring your blood pressure stay above normal most of the time, you are at risk for health problems. High blood pressure (hypertension) is a long-term (chronic) condition in which blood pressure is elevated. A blood  pressure reading is recorded as two numbers, such as 120 over 80 (or 120/80). The first, higher number is called the systolic pressure. It is a measure of the pressure in your arteries as the heart beats. The second, lower number is called the diastolic pressure. It is a measure of the pressure in your arteries as the heart relaxes between beats.  Keeping your blood pressure in a normal range is important to your overall health and prevention of health problems, such as heart disease and stroke. When your blood pressure is uncontrolled, your heart has to work harder than normal. High blood pressure is a very common condition in adults because blood pressure tends to rise with age. Men and women are equally likely to have hypertension but at different times in life. Before age 27, men are more likely to have hypertension. After 54 years of age, women are more likely to have it. Hypertension is especially common in African Americans. This condition often has no signs or symptoms. The cause of the condition is usually not known. Your caregiver can help you come up with a plan to keep your blood pressure in a normal, healthy range. BLOOD PRESSURE STAGES Blood pressure is classified into four stages: normal, prehypertension, stage 1, and stage 2. Your blood pressure reading will be used to determine what type of treatment, if any, is necessary. Appropriate treatment options are tied to these four stages:  Normal  Systolic pressure (mm Hg): below 120.  Diastolic pressure (mm Hg): below 80. Prehypertension  Systolic pressure (mm Hg): 120 to 139.  Diastolic pressure (mm Hg): 80 to 89. Stage1  Systolic pressure (mm Hg): 140 to 159.  Diastolic pressure (mm Hg): 90 to 99. Stage2  Systolic pressure (mm Hg): 160 or above.  Diastolic pressure (mm Hg): 100 or above. RISKS RELATED TO HIGH BLOOD PRESSURE Managing your blood pressure is an important responsibility. Uncontrolled high blood pressure can lead  to:  A heart attack.  A stroke.  A weakened blood vessel (aneurysm).  Heart failure.  Kidney damage.  Eye damage.  Metabolic syndrome.  Memory and concentration problems. HOW TO MANAGE YOUR BLOOD PRESSURE Blood pressure can be managed effectively with lifestyle changes and medicines (if needed). Your caregiver will help you come up with a plan to bring your blood pressure within a normal range. Your plan should include the following: Education  Read all information provided by your caregivers about how to control blood pressure.  Educate yourself on the latest  guidelines and treatment recommendations. New research is always being done to further define the risks and treatments for high blood pressure. Lifestylechanges  Control your weight.  Avoid smoking.  Stay physically active.  Reduce the amount of salt in your diet.  Reduce stress.  Control any chronic conditions, such as high cholesterol or diabetes.  Reduce your alcohol intake. Medicines  Several medicines (antihypertensive medicines) are available, if needed, to bring blood pressure within a normal range. Communication  Review all the medicines you take with your caregiver because there may be side effects or interactions.  Talk with your caregiver about your diet, exercise habits, and other lifestyle factors that may be contributing to high blood pressure.  See your caregiver regularly. Your caregiver can help you create and adjust your plan for managing high blood pressure. RECOMMENDATIONS FOR TREATMENT AND FOLLOW-UP  The following recommendations are based on current guidelines for managing high blood pressure in nonpregnant adults. Use these recommendations to identify the proper follow-up period or treatment option based on your blood pressure reading. You can discuss these options with your caregiver.  Systolic pressure of 162 to 446 or diastolic pressure of 80 to 89: Follow up with your caregiver as  directed.  Systolic pressure of 950 to 722 or diastolic pressure of 90 to 100: Follow up with your caregiver within 2 months.  Systolic pressure above 575 or diastolic pressure above 051: Follow up with your caregiver within 1 month.  Systolic pressure above 833 or diastolic pressure above 582: Consider antihypertensive therapy; follow up with your caregiver within 1 week.  Systolic pressure above 518 or diastolic pressure above 984: Begin antihypertensive therapy; follow up with your caregiver within 1 week. Document Released: 04/16/2012 Document Reviewed: 04/16/2012 [facility] Patient Information 2015 [place]. This information is not intended to replace advice given to you by your health care provider. Make sure you discuss any questions you have with your health care provider.
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
## 2015-04-09 ENCOUNTER — Emergency Department (INDEPENDENT_AMBULATORY_CARE_PROVIDER_SITE_OTHER)
Admission: EM | Admit: 2015-04-09 | Discharge: 2015-04-09 | Disposition: A | Discharge status: Home / Self Care | Payer: No Typology Code available for payment source | Source: Home / Self Care | Attending: Family Medicine | Admitting: Family Medicine

## 2015-04-09 ENCOUNTER — Encounter (HOSPITAL_COMMUNITY): Payer: Self-pay | Admitting: Emergency Medicine

## 2015-04-09 DIAGNOSIS — N644 Mastodynia: Secondary | ICD-10-CM | POA: Diagnosis not present

## 2015-04-09 DIAGNOSIS — N63 Unspecified lump in breast: Secondary | ICD-10-CM | POA: Diagnosis not present

## 2015-04-09 DIAGNOSIS — N6320 Unspecified lump in the left breast, unspecified quadrant: Secondary | ICD-10-CM

## 2015-04-09 NOTE — ED Provider Notes (Signed)
CSN: [id number]     Arrival date & time 04/09/15  1417 History   First MD Initiated Contact with Patient 04/09/15 1521     Chief Complaint  Patient presents with  . Breast Pain   (Consider location/radiation/quality/duration/timing/severity/associated sxs/prior Treatment) HPI Comments: 54 year old African-American female complaining of left breast pain for at least 8 months. She states she has had intermittent vague pain "all of her life". The pain is described as sharp and is gradually progressing and getting worse over the past few months. She is also noticed deformities and firmness of breast tissue developing over this time. The only other symptom she complains of is being tired.   Past Medical History  Diagnosis Date  . Hypertension    Past Surgical History  Procedure Laterality Date  . Abdominal hysterectomy  1997   No family history on file. Social History  Substance Use Topics  . Smoking status: Current Every Day Smoker -- 0.10 packs/day    Types: Cigarettes  . Smokeless tobacco: None  . Alcohol Use: Yes     Comment: socially   OB History    No data available     Review of Systems  Constitutional: Positive for fatigue. Negative for fever.  Skin: Positive for color change.  All other systems reviewed and are negative.   Allergies  Latex  Home Medications   Prior to Admission medications   Medication Sig Start Date End Date Taking? Authorizing Provider  hydrochlorothiazide (HYDRODIURIL) 25 MG tablet Take 1 tablet (25 mg total) by mouth daily. 09/15/14   Janne Napoleon, NP  ibuprofen (ADVIL,MOTRIN) 200 MG tablet Take 800-1,000 mg by mouth 3 (three) times daily as needed for headache.     Historical Provider, MD   Meds Ordered and Administered this Visit  Medications - No data to display  BP 184/113 mmHg  Pulse 80  Temp(Src) 98 F (36.7 C) (Oral)  Resp 20  SpO2 98% No data found.   Physical Exam  Constitutional: She appears well-developed and well-nourished.  No distress.  Neck: Normal range of motion. Neck supple.  Cardiovascular: Normal rate.   Pulmonary/Chest: Effort normal. No respiratory distress. Left breast exhibits inverted nipple, mass, skin change and tenderness. Left breast exhibits no nipple discharge. Breasts are asymmetrical.    Sharee Pimple, RN present. The left breast is deformed with a large hard mass encompassing the entire breast. Positive for peau de orange, nipple retraction. Minimal tenderness to the left breast. No nipple discharge or bleeding. No surrounding palpable lymphadenopathy.  Musculoskeletal: She exhibits no edema or tenderness.  Neurological: She is alert. She exhibits normal muscle tone.  Skin: Skin is warm and dry.  Nursing note and vitals reviewed.   ED Course  Procedures (including critical care time)  Labs Review Labs Reviewed - No data to display  Imaging Review No results found.   Visual Acuity Review  Right Eye Distance:   Left Eye Distance:   Bilateral Distance:    Right Eye Near:   Left Eye Near:    Bilateral Near:         MDM   1. Left breast mass   2. Breast pain, left    The patient has an appointment with [facility] to have a mammogram on September 7. I will write a prescription for her to have diagnostic mammogram. She is also given a referral to the [facility] what she has a reading. Information on abnormalities of the breast as well as instructions are given to her.  Janne Napoleon, NP 04/09/15 [phone]

## 2015-04-09 NOTE — Discharge Instructions (Signed)
After receiving your mammogram test results you may call the number on page one of your instructions for appointment.The mammogram center may also help with this.  Breast Tenderness Breast tenderness is a common problem for women of all ages. Breast tenderness may cause mild discomfort to severe pain. It has a variety of causes. Your health care provider will find out the likely cause of your breast tenderness by examining your breasts, asking you about symptoms, and ordering some tests. Breast tenderness usually does not mean you have breast cancer. HOME CARE INSTRUCTIONS  Breast tenderness often can be handled at home. You can try:  Getting fitted for a new bra that provides more support, especially during exercise.  Wearing a more supportive bra or sports bra while sleeping when your breasts are very tender.  If you have a breast injury, apply ice to the area:  Put ice in a plastic bag.  Place a towel between your skin and the bag.  Leave the ice on for 20 minutes, 2-3 times a day.  If your breasts are too full of milk as a result of breastfeeding, try:  Expressing milk either by hand or with a breast pump.  Applying a warm compress to the breasts for relief.  Taking over-the-counter pain relievers, if approved by your health care provider.  Taking other medicines that your health care provider prescribes. These may include antibiotic medicines or birth control pills. Over the long term, your breast tenderness might be eased if you:  Cut down on caffeine.  Reduce the amount of fat in your diet. Keep a log of the days and times when your breasts are most tender. This will help you and your health care provider find the cause of the tenderness and how to relieve it. Also, learn how to do breast exams at home. This will help you notice if you have an unusual growth or lump that could cause tenderness. SEEK MEDICAL CARE IF:   Any part of your breast is hard, red, and hot to the  touch. This could be a sign of infection.  Fluid is coming out of your nipples (and you are not breastfeeding). Especially watch for blood or pus.  You have a fever as well as breast tenderness.  You have a new or painful lump in your breast that remains after your menstrual period ends.  You have tried to take care of the pain at home, but it has not gone away.  Your breast pain is getting worse, or the pain is making it hard to do the things you usually do during your day. Document Released: 07/05/2008 Document Revised: 03/25/2013 Document Reviewed: 02/19/2013 [facility] Patient Information 2015 [place], [place]. This information is not intended to replace advice given to you by your health care provider. Make sure you discuss any questions you have with your health care provider.  Breast Biopsy A breast biopsy is a test during which a sample of tissue is taken from your breast. The breast tissue is looked at under a microscope for cancer cells.  BEFORE THE PROCEDURE  Make plans to have someone drive you home after the test.  Do not smoke for 2 weeks before the test. Stop smoking, if you smoke.  Do not drink alcohol for 24 hours before the test.  Wear a good support bra to the test. PROCEDURE  You may be given one of the following:  A medicine to numb the breast area (local anesthetic).  A medicine to make you  fall asleep (general anesthetic). There are different types of breast biopsies. They include:  Fine-needle aspiration.  A needle is put into the breast lump.  The needle takes out fluid and cells from the lump.  Ultrasound imaging may be used to help find the lump and to put the needle in the right spot.  Core-needle biopsy.  A needle is put into the breast lump.  The needle is put in your breast 3-6 times.  The needle removes breast tissue.  An ultrasound image or X-ray is often used to find the right spot to put in the needle.  Stereotactic biopsy.  X-rays  and a computer are used to study X-ray pictures of the breast lump.  The computer finds where the needle needs to be put into the breast.  Tissue samples are taken out.  Vacuum-assisted biopsy.  A small cut (incision) is made in your breast.  A biopsy device is put through the cut and into the breast tissue.  The biopsy device draws abnormal breast tissue into the biopsy device.  A large tissue sample is often removed.  No stitches are needed.  Ultrasound-guided core-needle biopsy.  Ultrasound imaging helps guide the needle into the area of the breast that is not normal.  A cut is made in the breast. The needle is put into the breast lump.  Tissue samples are taken out.  Open biopsy.  A large cut is made in the breast.  Your doctor will try to remove the whole breast lump or as much as possible. All tissue, fluid, or cell samples are looked at under a microscope.  AFTER THE PROCEDURE  You will be taken to an area to recover. You will be able to go home once you are doing well and are without problems.  You may have bruising on your breast. This is normal.  A pressure bandage (dressing) may be put on your breast for 24-48 hours. This type of bandage is wrapped tightly around your chest. It helps stop fluid from building up underneath tissues. Document Released: 10/15/2011 Document Revised: 12/07/2013 Document Reviewed: 10/15/2011 [facility] Patient Information 2015 [place], [place]. This information is not intended to replace advice given to you by your health care provider. Make sure you discuss any questions you have with your health care provider.  Breast Scan Breast scan is procedure done to examine dense breast tissue, which is difficult in a normal mammogram. It is used in women with breast lesions from fibrocystic disease, fibroadenoma, and fat necrosis. It also is used to determine the course of treatment for breast cancer. LET [facility] CARE PROVIDER KNOW  ABOUT:  Any allergies you have.  All medicines you are taking, including vitamins, herbs, eye drops, creams, and over-the-counter medicines.  Previous problems you or members of your family have had with the use of anesthetics.  Any blood disorders you have.  Previous surgeries you have had.  Medical conditions you have.  Pregnancy or the possibility that you may be pregnant. RISKS AND COMPLICATIONS Generally, this is a safe procedure. However, as with any procedure, complications can occur. Possible complications include:   Slight discomfort from injection of radioactive substance.  Allergic reaction to contrast or radioactive substance used in exam. BEFORE THE PROCEDURE No fasting or sedation is required. PROCEDURE   You will be asked to remove all jewelry and clothing from the waist up.  An IV tube will be inserted in your arm or hand opposite the side of the breast to be  examined. If both breasts are being evaluated, the IV tube may be inserted into a vein in the foot.  You will be positioned face down on a table. The breast to be imaged will be placed through an opening in the table.  The radioactive agent will be injected into the IV tube. You may experience a slight metallic taste after the injection.  Imaging will begin a few minutes after the injection. A scanner will be placed over the breast and will record the radiation given off.  You may also be asked to get into different positions during the scan.  When the scan is complete, the IV tube is removed. AFTER THE PROCEDURE  You will be asked to get up slowly from the scanner to avoid light-headedness from lying flat during the procedure.  Drink plenty of fluids to help flush the remaining radioactive agent from your body. Document Released: 08/17/2004 Document Revised: 07/28/2013 Document Reviewed: 03/30/2013 [facility] Patient Information 2015 [place], [place]. This information is not intended to replace advice  given to you by your health care provider. Make sure you discuss any questions you have with your health care provider.

## 2015-04-09 NOTE — ED Notes (Signed)
Written Rx for mammogram order was given to pt by Youlanda Roys, NP.  Copy placed in scan basket.

## 2015-04-09 NOTE — ED Notes (Signed)
C/o left breast pain/hardness onset x10 months  Needing referral to have a mammogram Denies inj/trauma Alert.. No acute distress.

## 2015-04-19 ENCOUNTER — Other Ambulatory Visit: Payer: Self-pay | Admitting: Radiology

## 2015-04-21 ENCOUNTER — Other Ambulatory Visit: Payer: Self-pay | Admitting: Radiology

## 2015-04-21 ENCOUNTER — Encounter: Payer: Self-pay | Admitting: *Deleted

## 2015-04-21 ENCOUNTER — Telehealth: Payer: Self-pay | Admitting: *Deleted

## 2015-04-21 DIAGNOSIS — Z853 Personal history of malignant neoplasm of breast: Secondary | ICD-10-CM | POA: Insufficient documentation

## 2015-04-21 DIAGNOSIS — Z170 Estrogen receptor positive status [ER+]: Secondary | ICD-10-CM

## 2015-04-21 DIAGNOSIS — C50912 Malignant neoplasm of unspecified site of left female breast: Principal | ICD-10-CM

## 2015-04-21 DIAGNOSIS — C50412 Malignant neoplasm of upper-outer quadrant of left female breast: Secondary | ICD-10-CM

## 2015-04-21 DIAGNOSIS — C50911 Malignant neoplasm of unspecified site of right female breast: Secondary | ICD-10-CM

## 2015-04-21 HISTORY — DX: Malignant neoplasm of upper-outer quadrant of left female breast: C50.412

## 2015-04-21 NOTE — Telephone Encounter (Signed)
Confirmed [facility] for 04/27/15 at 1230 .  Instructions and contact information given.

## 2015-04-27 ENCOUNTER — Telehealth: Payer: Self-pay | Admitting: Oncology

## 2015-04-27 ENCOUNTER — Ambulatory Visit: Payer: No Typology Code available for payment source | Attending: General Surgery | Admitting: Physical Therapy

## 2015-04-27 ENCOUNTER — Encounter: Payer: Self-pay | Admitting: Oncology

## 2015-04-27 ENCOUNTER — Other Ambulatory Visit: Payer: Self-pay | Admitting: *Deleted

## 2015-04-27 ENCOUNTER — Ambulatory Visit
Admission: RE | Admit: 2015-04-27 | Discharge: 2015-04-27 | Disposition: A | Discharge status: Home / Self Care | Payer: No Typology Code available for payment source | Source: Ambulatory Visit | Attending: Radiation Oncology | Admitting: Radiation Oncology

## 2015-04-27 ENCOUNTER — Other Ambulatory Visit (HOSPITAL_BASED_OUTPATIENT_CLINIC_OR_DEPARTMENT_OTHER): Payer: No Typology Code available for payment source

## 2015-04-27 ENCOUNTER — Other Ambulatory Visit: Payer: Self-pay | Admitting: General Surgery

## 2015-04-27 ENCOUNTER — Ambulatory Visit (HOSPITAL_BASED_OUTPATIENT_CLINIC_OR_DEPARTMENT_OTHER): Payer: No Typology Code available for payment source | Admitting: Oncology

## 2015-04-27 ENCOUNTER — Encounter: Payer: Self-pay | Admitting: Physical Therapy

## 2015-04-27 VITALS — BP 177/104 | HR 96 | Temp 98.9°F | Resp 18 | Ht 69.0 in | Wt 285.8 lb

## 2015-04-27 DIAGNOSIS — C50912 Malignant neoplasm of unspecified site of left female breast: Secondary | ICD-10-CM | POA: Insufficient documentation

## 2015-04-27 DIAGNOSIS — K219 Gastro-esophageal reflux disease without esophagitis: Secondary | ICD-10-CM | POA: Diagnosis not present

## 2015-04-27 DIAGNOSIS — C50911 Malignant neoplasm of unspecified site of right female breast: Secondary | ICD-10-CM

## 2015-04-27 DIAGNOSIS — C50412 Malignant neoplasm of upper-outer quadrant of left female breast: Secondary | ICD-10-CM | POA: Diagnosis not present

## 2015-04-27 DIAGNOSIS — R293 Abnormal posture: Secondary | ICD-10-CM | POA: Insufficient documentation

## 2015-04-27 DIAGNOSIS — C773 Secondary and unspecified malignant neoplasm of axilla and upper limb lymph nodes: Secondary | ICD-10-CM

## 2015-04-27 DIAGNOSIS — Z170 Estrogen receptor positive status [ER+]: Secondary | ICD-10-CM

## 2015-04-27 DIAGNOSIS — C50411 Malignant neoplasm of upper-outer quadrant of right female breast: Secondary | ICD-10-CM

## 2015-04-27 DIAGNOSIS — I10 Essential (primary) hypertension: Secondary | ICD-10-CM

## 2015-04-27 DIAGNOSIS — C50211 Malignant neoplasm of upper-inner quadrant of right female breast: Secondary | ICD-10-CM

## 2015-04-27 DIAGNOSIS — Z853 Personal history of malignant neoplasm of breast: Secondary | ICD-10-CM | POA: Insufficient documentation

## 2015-04-27 LAB — COMPREHENSIVE METABOLIC PANEL (CC13)
ALT: 32 U/L (ref 0–55)
AST: 24 U/L (ref 5–34)
Albumin: 3.7 g/dL (ref 3.5–5.0)
Alkaline Phosphatase: 107 U/L (ref 40–150)
Anion Gap: 9 mEq/L (ref 3–11)
BUN: 10 mg/dL (ref 7.0–26.0)
CO2: 29 mEq/L (ref 22–29)
Calcium: 9.2 mg/dL (ref 8.4–10.4)
Chloride: 104 mEq/L (ref 98–109)
Creatinine: 0.8 mg/dL (ref 0.6–1.1)
EGFR: >90 mL/min/{1.73_m2} (ref >90)
Glucose: 97 mg/dl (ref 70–140)
Potassium: 3.1 mEq/L — ABNORMAL LOW (ref 3.5–5.1)
Sodium: 142 mEq/L (ref 136–145)
Total Bilirubin: 0.32 mg/dL (ref 0.20–1.20)
Total Protein: 8.1 g/dL (ref 6.4–8.3)

## 2015-04-27 LAB — CBC WITH DIFFERENTIAL/PLATELET
BASO%: 0.6 % (ref 0.0–2.0)
Basophils Absolute: 0.1 10*3/uL (ref 0.0–0.1)
EOS%: 2.2 % (ref 0.0–7.0)
Eosinophils Absolute: 0.2 10*3/uL (ref 0.0–0.5)
HCT: 42.8 % (ref 34.8–46.6)
HGB: 14.1 g/dL (ref 11.6–15.9)
LYMPH%: 27 % (ref 14.0–49.7)
MCH: 29 pg (ref 25.1–34.0)
MCHC: 33 g/dL (ref 31.5–36.0)
MCV: 87.8 fL (ref 79.5–101.0)
MONO#: 1.3 10*3/uL — ABNORMAL HIGH (ref 0.1–0.9)
MONO%: 11.6 % (ref 0.0–14.0)
NEUT#: 6.4 10*3/uL (ref 1.5–6.5)
NEUT%: 58.6 % (ref 38.4–76.8)
Platelets: 241 10*3/uL (ref 145–400)
RBC: 4.88 10*6/uL (ref 3.70–5.45)
RDW: 14.2 % (ref 11.2–14.5)
WBC: 11 10*3/uL — ABNORMAL HIGH (ref 3.9–10.3)
lymph#: 3 10*3/uL (ref 0.9–3.3)

## 2015-04-27 MED ORDER — LOSARTAN POTASSIUM 50 MG PO TABS: 50.0000 mg | ORAL_TABLET | Freq: Every day | ORAL | Status: DC

## 2015-04-27 NOTE — Addendum Note (Signed)
Addended by: Lujean Amel on: 04/27/2015 05:08 PM   Modules accepted: Orders, Medications

## 2015-04-27 NOTE — Progress Notes (Signed)
Margaret [name]  [phone] [phone]     ID: Margaret [name] DOB: [birth year]  MR#: [id number]  UMP#:[id number]  Patient Care Team: No Pcp Per Patient as PCP - General (General Practice) Margaret Messing III, MD as Consulting Physician (General Surgery) Margaret Cruel, MD as Consulting Physician (Oncology) Margaret Pray, MD as Consulting Physician (Radiation Oncology) Margaret Kaufmann, RN as Registered Nurse Margaret Germany, RN as Registered Nurse PCP: No PCP Per Patient GYN: OTHER MD:  CHIEF COMPLAINT: Locally advanced estrogen receptor positive breast cancer; bilateral breast cancer  CURRENT TREATMENT: Awaiting definitive surgery   BREAST CANCER HISTORY: Margaret [name]'s primary care physician retired sometime ago and her health maintenance has not been up-to-date. She has been receiving medical care through the emergency room and was seen in March 2015 following a head injury, then in August 2015 for lancing of an abscess. In December 2015 she noticed that her left breast looked a little bit smaller than her right. She brought this to the attention of friends and family over the next several months but the general feeling was that most people are not perfectly symmetrical. By the summer of this year she noticed that her nipple on the left was "going in". More recently she started developing pain in the left breast. She was evaluated for this in the emergency room 04/09/2015 at which time a left breast exam showed a deformed left breast with a large hard mass encompassing most of the breast, with nipple retraction. There was no nipple discharge or bleeding and no palpable adenopathy.  The patient was referred to [facility] and on 04/13/2015 she underwent bilateral diagnostic mammography with tomosynthesis as well as bilateral breast ultrasound. The breast density was category B. In the right breast at the 11:00 position there was a 1.3 cm irregular mass which by ultrasonography  measured 1.3 cm.--In the left breast there was a 4 cm irregular mass at the 2:00 position associated with nipple retraction. There was a second, 1.7 cm area of architectural distortion at the 9:00 position. Both were palpable. By ultrasonography, the 4 cm irregular mass was noted, with a second mass measuring 2.5 cm by ultrasonography. Both axillae were benign.  On 04/19/2015 the patient underwent right breast upper outer quadrant biopsy, showing ([id number]) and invasive ductal carcinoma, grade 1, estrogen receptor 90% positive, progesterone receptor negative, with an MIB-1 of 5%, and no HER-2 amplification, the signals ratio being 1.26 and the number per cell 2.20.  On the same day, she underwent biopsy of the 2 left breast masses in question as well as a suspicious left axillary lymph node. All 3 biopsies showed invasive ductal carcinoma, grade 2, estrogen receptor 80-100% positive, progesterone receptor 80-100% positive, with the MIB-1 between 5 and 10%, and no HER-2 amplification, the signals ratio being between 1.05 and 1.13, and the number per cell between 2.10 and 2.25.  The patient's subsequent history is as detailed below  INTERVAL HISTORY: Margaret [name] was evaluated in the multidisciplinary breast cancer clinic 04/27/2015. Her case was also presented in the multidisciplinary breast cancer conference that same morning. At that time a preliminary plan was proposed: Consideration of neoadjuvant treatment prior to left mastectomy, with axillary lymph node dissection; and right breast conserving surgery with sentinel lymph node sampling. Mammaprint was suggested even the low MI be-1 in all 4 tumors. The patient would benefit from postmastectomy radiation on the left, and long-term antiestrogen therapy  REVIEW OF SYSTEMS: Aside from the mass itself, there were  no specific symptoms leading to the original mammogram, which was routinely scheduled. The patient denies unusual headaches, visual changes,  nausea, vomiting, stiff neck, dizziness, or gait imbalance. There has been no cough, phlegm production, or pleurisy, no chest pain or pressure, and no change in bowel or bladder habits. The patient denies fever, rash, bleeding, unexplained fatigue or unexplained weight loss. Margaret [name] does complain of night sweats and hot flashes, insomnia, occasional toothache and other dental problems, and acid reflux. A detailed review of systems was otherwise entirely negative.  PAST MEDICAL HISTORY: Past Medical History  Diagnosis Date  . Hypertension   . Breast cancer of upper-outer quadrant of left female breast 04/21/2015  . Breast cancer     PAST SURGICAL HISTORY: Past Surgical History  Procedure Laterality Date  . Abdominal hysterectomy  1997    FAMILY HISTORY History reviewed. No pertinent family history.  the patient has little information on her father. Her mother is living, currently age 77. She has one brother, 2 sisters. There is no history of breast or ovarian cancer in the family to her knowledge.   GYNECOLOGIC HISTORY:  No LMP recorded. Patient has had a hysterectomy.  menarche age 55, first live birth age 30. She is GX P4. She underwent a total abdominal hysterectomy with bilateral salpingo-oophorectomy at age 65. She did not take hormone replacement. She never used oral contraceptives.   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 She is not employed by [employer] but by a Set designer. She is single and lives with her significant other Margaret [name]. He is status post renal transplant. The patient's daughter  Margaret [name] works as a Programmer, applications, as does her daughter  Margaret [name]. Daughter Margaret [name] works at [employer], as a call. All 3 live in [place]. The patient's son Margaret [name]'s lives in [place]. He prepares sets for shows The patient has 4 grandchildren, no great grandchildren. She attends a [employer] in [place].     ADVANCED DIRECTIVES: Not in place. At the initial clinic visit 04/27/2015 the patient was given the appropriate forms to complete and notarize at her discretion. The patient intends to name her daughterRaniesh Jimmye Norman as healthcare power of attorney. She can be reached at [phone].     HEALTH MAINTENANCE: Social History  Substance Use Topics  . Smoking status: Current Every Day Smoker -- 0.10 packs/day    Types: Cigarettes  . Smokeless tobacco: None  . Alcohol Use: Yes     Comment: socially     Colonoscopy: Never  PAP: Status post hysterectomy  Bone density: Never  Lipid panel:  Allergies  Allergen Reactions  . Latex Itching and Other (See Comments)    burning    Current Outpatient Prescriptions  Medication Sig Dispense Refill  . hydrochlorothiazide (HYDRODIURIL) 25 MG tablet Take 1 tablet (25 mg total) by mouth daily. 20 tablet 0  . ibuprofen (ADVIL,MOTRIN) 200 MG tablet Take 800-1,000 mg by mouth 3 (three) times daily as needed for headache.      No current facility-administered medications for this visit.    OBJECTIVE: Middle-aged African-American woman who appears older than stated age 68 Vitals:   04/27/15 1256  BP: 177/104  Pulse: 96  Temp: 98.9 F (37.2 C)  Resp: 18     Body mass index is 42.19 kg/(m^2).    ECOG FS:1 - Symptomatic but completely ambulatory  Ocular: Sclerae unicteric, pupils equal, round  and reactive to light Ear-nose-throat: Oropharynx clear and moist Lymphatic: No cervical or supraclavicular adenopathy Lungs no rales or rhonchi, good excursion bilaterally Heart regular rate and rhythm, no murmur appreciated Abd soft, obese, nontender, positive bowel sounds MSK no focal spinal tenderness, no joint edema Neuro: non-focal, well-oriented, appropriate affect Breasts: I do not palpate a mass in the right breast. There are no skin or nipple changes of concern. The right axilla is benign. Most of the left breast  is taken over by a central mass which is not attached to the chest wall. There is dimpling but no erythema. The breast is imaged below. The left axilla is benign.  Photo of the left breast 04/27/2015    LAB RESULTS:  CMP     Component Value Date/Time   NA 142 04/27/2015 1208   NA 138 03/13/2014 1205   K 3.1* 04/27/2015 1208   K 3.7 03/13/2014 1205   CL 102 03/13/2014 1205   CO2 29 04/27/2015 1208   CO2 22 03/13/2014 1205   GLUCOSE 97 04/27/2015 1208   GLUCOSE 94 03/13/2014 1205   BUN 10.0 04/27/2015 1208   BUN 10 03/13/2014 1205   CREATININE 0.8 04/27/2015 1208   CREATININE 0.72 03/13/2014 1205   CALCIUM 9.2 04/27/2015 1208   CALCIUM 9.2 03/13/2014 1205   PROT 8.1 04/27/2015 1208   ALBUMIN 3.7 04/27/2015 1208   AST 24 04/27/2015 1208   ALT 32 04/27/2015 1208   ALKPHOS 107 04/27/2015 1208   BILITOT 0.32 04/27/2015 1208   GFRNONAA >90 03/13/2014 1205   GFRAA >90 03/13/2014 1205    INo results found for: SPEP, UPEP  Lab Results  Component Value Date   WBC 11.0* 04/27/2015   NEUTROABS 6.4 04/27/2015   HGB 14.1 04/27/2015   HCT 42.8 04/27/2015   MCV 87.8 04/27/2015   PLT 241 04/27/2015      Chemistry      Component Value Date/Time   NA 142 04/27/2015 1208   NA 138 03/13/2014 1205   K 3.1* 04/27/2015 1208   K 3.7 03/13/2014 1205   CL 102 03/13/2014 1205   CO2 29 04/27/2015 1208   CO2 22 03/13/2014 1205   BUN 10.0 04/27/2015 1208   BUN 10 03/13/2014 1205   CREATININE 0.8 04/27/2015 1208   CREATININE 0.72 03/13/2014 1205      Component Value Date/Time   CALCIUM 9.2 04/27/2015 1208   CALCIUM 9.2 03/13/2014 1205   ALKPHOS 107 04/27/2015 1208   AST 24 04/27/2015 1208   ALT 32 04/27/2015 1208   BILITOT 0.32 04/27/2015 1208       No results found for: LABCA2  No components found for: LABCA125  No results for input(s): INR in the last 168 hours.  Urinalysis No results found for: COLORURINE, APPEARANCEUR, LABSPEC, PHURINE, GLUCOSEU, HGBUR,  BILIRUBINUR, KETONESUR, PROTEINUR, UROBILINOGEN, NITRITE, LEUKOCYTESUR  STUDIES: Outside studies reviewed  ASSESSMENT: 54 y.o. Margaret [name] woman with synchronous breast cancers, as follows  (a) status post right breast upper outer quadrant biopsy 04/19/2015 for a clinical pT1c N0, stage IA invasive ductal carcinoma, grade 1, estrogen receptor positive, progesterone receptor negative, with an MIB-1 of 5%, and no HER-2 amplification  (b) status post left breast biopsy 2 and left axillary lymph node biopsy 04/19/2015 for a clinical T3 N1, stage IIIA invasive ductal carcinoma, grade 1 or 2, estrogen receptor and progesterone receptor positive, HER-2 negative, with an MIB-1 between 5 and 10%  (1) the patient has opted with bilateral mastectomies with right sentinel  lymph node sampling and left axillary lymph node dissection   (2) Mammaprint will be sent from the left-sided tumor   (3) Oncotype will be requested from the right-sided tumor  (4) postmastectomy radiation on the left is planned  (5) 10 years of antiestrogen therapy anticipated  PLAN: We spent the better part of today's hour-long appointment discussing the biology of breast cancer in general, and the specifics of the patient's tumor in particular. Treated his situation is unusual. She has a stage [name] tumor on the left, which is however not very aggressive. This is a neglected breast cancer.  She understands we need to stage her and a PET scan is being requested. If she has stage IV disease she will still receive optimal local treatment and in that case we would treat her only with anti-estrogens  If she does have stage [name] disease, which I anticipate to be the case, then she will still have the same local treatment, namely bilateral mastectomies with right sentinel lymph node sampling and left axillary lymph node dissection. She will need post mastectomy radiation on the left  The question is chemotherapy. We now have results from  today [facility] which show that in cases where there is a discrepancy between the anatomic and genomic prognosticators, if the 70 gene test predicted a low risk tumor, there was no significant benefit from adding chemotherapy to anti-estrogens. The majority of cases were stages 1 and 2 but there were a significant number of stage [name] cases and in that specific subset the validity of the test also held up.  I did explain to Margaret [name] that chemotherapy is standard for stage [name] breast cancer but that we would be obtaining these tests and then I would be able to give her more numeric information. She is very reluctant in any case to receive chemotherapy not only for the obvious concerns regarding side effects, but also because she does not want to lose her job, which is very important for her financially and socially, and which she has had only for a few months.  Incidentally she requested blood pressure medication and I wrote her for losartan, staying away from lisinopril and similars because of the cough problems she is having due to reflux.  We anticipate her surgery take place the first week in October. I will see her 2-3 weeks after that to discuss the results of her genomic's tests. Of course we will call her with the PET results as soon as they become available.  Jeylin has a good understanding of the overall plan. She agrees with it. She knows the goal of treatment in her case is cure. She will call with any problems that may develop before her next visit here.  Margaret Cruel, MD   04/27/2015 2:09 PM Medical Oncology and Hematology [facility] [address] [place], [place] [address] Tel. [phone]    Fax. [phone]

## 2015-04-27 NOTE — Patient Instructions (Signed)

## 2015-04-27 NOTE — Progress Notes (Signed)
Radiation Oncology         [phone] ________________________________  Initial Outpatient Consultation  Name: Margaret [name] MRN: [id number]  Date: 04/27/2015  DOB: 04-09-1961  CC:No PCP Per Patient  Jovita Kussmaul, MD   REFERRING PHYSICIAN: Autumn Messing III, MD  DIAGNOSIS: The primary encounter diagnosis was Breast cancer of upper-outer quadrant of left female breast. A diagnosis of Breast cancer of upper-inner quadrant of right female breast was also pertinent to this visit. Clincal Stage IIIA (T3, N1, M0) left breast, invasive ductal, Clinical Stage IA right breast cancer  HISTORY OF PRESENT ILLNESS::Margaret [name] is a 54 y.o. female who is seen out of courtesy to Dr. Marlou Starks as part of the multidisciplinary breast clinic. The patient presented with a palpable left breast mass. She underwent mammography which revealed a retracted left breast skin thickening and a lesion was noted on the right breast. Mammographer noticed ?peau d'orange changes, as well. Two suspicious areas noted within the left breast. One in the 4 o'clock position and one in the 9 o'clock position, those areas were approximately 4 cm apart. She had a biopsy of two areas within the left breast, an area in the right breast, and suspicious lymph node in the left axilla. All those biopsies showed malignancy. Right breast showed a Grade I invasive ductal breast cancer which was ER+, PR+, HER-2 (-), with Ki-67 10%. Left breast both biopsies showed invasive ductal carcinoma, low grade ER+,PR+, with Ki-67 5%.    PREVIOUS RADIATION THERAPY: No  PAST MEDICAL HISTORY:  has a past medical history of Hypertension; Breast cancer of upper-outer quadrant of left female breast (04/21/2015); and Breast cancer.    PAST SURGICAL HISTORY: Past Surgical History  Procedure Laterality Date  . Abdominal hysterectomy  1997    FAMILY HISTORY: family history is not on file.  SOCIAL HISTORY:  reports that she has been smoking Cigarettes.  She  has been smoking about 0.10 packs per day. She does not have any smokeless tobacco history on file. She reports that she drinks alcohol. She reports that she does not use illicit drugs.  ALLERGIES: Latex  MEDICATIONS:  Current Outpatient Prescriptions  Medication Sig Dispense Refill  . ibuprofen (ADVIL,MOTRIN) 200 MG tablet Take 800-1,000 mg by mouth 3 (three) times daily as needed for headache.      No current facility-administered medications for this encounter.    REVIEW OF SYSTEMS:  A 15 point review of systems is documented in the electronic medical record. This was obtained by the nursing staff. However, I reviewed this with the patient to discuss relevant findings and make appropriate changes.  She experienced sharp pain in her breast, similar to breast tenderness during menstrual periods. She noticed some breast shrinkage, as well. She believed these symptoms to be menopause related. She denies nipple discharge or bleeding.   PHYSICAL EXAM:  Vitals with BMI 04/27/2015  Height $Remov'5\' 9"'[id number]$   Weight 285 lbs 13 oz  BMI 56.1  Systolic 537  Diastolic 943  Pulse 96  Respirations 18   Heart has regular rhythm and rate. Lungs are clear to auscultation. No palpable supraclavicular or axillary adenopathy present.  Breast: Right breast are large and pendulous without palpable mass or nipple discharge. Left breast is considerably smaller than the right. Nipple areolar area is retracted somewhat. There is dimpling in the upper outer quadrant of the left breast. Palpation approximately 12x12 cm mass centrally in the left breast.     ECOG = 0  LABORATORY DATA:  Lab Results  Component Value Date   WBC 11.0* 04/27/2015   HGB 14.1 04/27/2015   HCT 42.8 04/27/2015   MCV 87.8 04/27/2015   PLT 241 04/27/2015   NEUTROABS 6.4 04/27/2015   Lab Results  Component Value Date   NA 142 04/27/2015   K 3.1* 04/27/2015   CL 102 03/13/2014   CO2 29 04/27/2015   GLUCOSE 97 04/27/2015   CREATININE 0.8  04/27/2015   CALCIUM 9.2 04/27/2015      RADIOGRAPHY: No results found.    IMPRESSION: Patient has locally advanced left breast cancer. We discussed options for management. The patient has already decided she'd like to proceed with bilateral mastectomies, scheduled for 05/09/15 with Dr. Marlou Starks. The patient would benefit from post mastectomy radiation therapy due to her significant clinical presentation along the left side.. Her original MRI scheduled for tomorrow, 9/22, was cancelled due to her decision on bilateral mastectomies.  PLAN: The patient has a double mastectomy scheduled for 05/09/15 with Dr. Marlou Starks. We will follow up at a later date. She will also undergo staging including PET scan. She will proceed with genetics and mammoprint.      This document serves as a record of services personally performed by Gery Pray, MD. It was created on his behalf by Arlyce Harman, a trained medical scribe. The creation of this record is based on the scribe's personal observations and the provider's statements to them. This document has been checked and approved by the attending provider. ------------------------------------------------  Blair Promise, PhD, MD

## 2015-04-27 NOTE — Therapy (Signed)
[place], [place], [address] Phone: [phone]   Fax:  [phone]  Physical Therapy Evaluation  Patient Details  Name: Margaret [name] MRN: [id number] Date of Birth: 1961/07/05 Referring Provider:  Jovita Kussmaul, MD  Encounter Date: 04/27/2015      PT End of Session - 04/27/15 1538    Visit Number 1   Number of Visits 1   PT Start Time 1440   PT Stop Time 1504   PT Time Calculation (min) 24 min   Activity Tolerance Patient tolerated treatment well   Behavior During Therapy [facility] for tasks assessed/performed      Past Medical History  Diagnosis Date  . Hypertension   . Breast cancer of upper-outer quadrant of left female breast 04/21/2015  . Breast cancer     Past Surgical History  Procedure Laterality Date  . Abdominal hysterectomy  1997    There were no vitals filed for this visit.  Visit Diagnosis:  Bilateral breast cancer - Plan: PT plan of care cert/re-cert  Abnormal posture - Plan: PT plan of care cert/re-cert      Subjective Assessment - 04/27/15 1348    Subjective She was seen today for a baseline assessment of her newly diagnosed bilateral breast cancer.   Pertinent History She was diagnosed 04/13/15 with bilateral breast cancer.  The left breast has 2 areas at 4:00 and 9:00 measuring 4 cm and 1.7 cm.  Ki67 on the left is 5%.  On the right, she has 1 small area in the upper inner quadrant with a Ki67 of 10%.  Cancer is grade I invasive disease.  She also has a biopsied positive axilla lymph node on the left.   Patient Stated Goals Reduce lymphedema risk and learn post op shoulder ROM HEP   Currently in Pain? No/denies            [facility] PT Assessment - 04/27/15 0001    Assessment   Medical Diagnosis Bilateral breast cancer   Onset Date/Surgical Date 04/13/15   Hand Dominance Right   Prior Therapy none   Precautions   Precautions Other (comment)  Active bilateral breast  cancer   Restrictions   Weight Bearing Restrictions No   Balance Screen   Has the patient fallen in the past 6 months No   Has the patient had a decrease in activity level because of a fear of falling?  No   Is the patient reluctant to leave their home because of a fear of falling?  No   Home Environment   Living Environment Private residence   Living Arrangements Spouse/significant other  boyfriend   Available Help at Discharge Family   Prior Function   Level of Independence Independent   Vocation Full time employment   Arts development officer at [employer] She does not exercise   Cognition   Overall Cognitive Status Within Functional Limits for tasks assessed   Posture/Postural Control   Posture/Postural Control Postural limitations   Postural Limitations Rounded Shoulders;Forward head   ROM / Strength   AROM / PROM / Strength AROM;Strength   AROM   AROM Assessment Site Shoulder   Right/Left Shoulder Right;Left   Right Shoulder Extension 50 Degrees   Right Shoulder Flexion 140 Degrees   Right Shoulder ABduction 140 Degrees   Right Shoulder Internal Rotation 59 Degrees   Right Shoulder External Rotation 88 Degrees   Left Shoulder Extension 70 Degrees   Left Shoulder  Flexion 152 Degrees   Left Shoulder ABduction 141 Degrees   Left Shoulder Internal Rotation 83 Degrees   Left Shoulder External Rotation 88 Degrees   Strength   Overall Strength Within functional limits for tasks performed           LYMPHEDEMA/ONCOLOGY QUESTIONNAIRE - 04/27/15 1352    Type   Cancer Type Bilateral breast cancer   Lymphedema Assessments   Lymphedema Assessments Upper extremities   Right Upper Extremity Lymphedema   10 cm Proximal to Olecranon Process 42.2 cm   Olecranon Process 33.1 cm   10 cm Proximal to Ulnar Styloid Process 27.2 cm   Just Proximal to Ulnar Styloid Process 20.4 cm   Across Hand at [employer] 21 cm   At [place] of 2nd Digit 7.5 cm   Left Upper  Extremity Lymphedema   10 cm Proximal to Olecranon Process 42.1 cm   Olecranon Process 33.1 cm   10 cm Proximal to Ulnar Styloid Process 26.3 cm   Just Proximal to Ulnar Styloid Process 20.3 cm   Across Hand at [employer] 21.2 cm   At [place] of 2nd Digit 7.2 cm      Patient was instructed today in a home exercise program today for post op shoulder range of motion. These included active assist shoulder flexion in sitting, scapular retraction, wall walking with shoulder abduction, and hands behind head external rotation.  She was encouraged to do these twice a day, holding 3 seconds and repeating 5 times when permitted by her physician.         PT Education - 04/27/15 1354    Education provided Yes   Education Details Post op shoulder ROM HEP and lymphedema risk reduction   Person(s) Educated Patient   Methods Explanation;Demonstration;Handout   Comprehension Returned demonstration;Verbalized understanding              Breast Clinic Goals - 04/27/15 1416    Patient will be able to verbalize understanding of pertinent lymphedema risk reduction practices relevant to her diagnosis specifically related to skin care.   Time 1   Period Days   Status Achieved   Patient will be able to return demonstrate and/or verbalize understanding of the post-op home exercise program related to regaining shoulder range of motion.   Time 1   Period Days   Status Achieved   Patient will be able to verbalize understanding of the importance of attending the postoperative After Breast Cancer Class for further lymphedema risk reduction education and therapeutic exercise.   Time 1   Period Days   Status Achieved              Plan - 04/27/15 1355    Clinical Impression Statement She was diagnosed 04/13/15 with bilateral breast cancer.  The left breast has 2 areas at 4:00 and 9:00 measuring 4 cm and 1.7 cm.  Ki67 on the left is 5%.  On the right, she has 1 small area in the upper inner quadrant  with a Ki67 of 10%.  Cancer is grade I invasive disease.  She also has a biopsied positive axilla lymph node on the left.  She is planning to have a bilateral mastectomy with a left axillary lymph node dissection and a right sentinel node biopsy.  She will have bilateral breast and left axillary radiation.  She will also do anti-estrogen therapy.  She will benefit from post op PT to regain shoulder ROM and reduce lymphedema risk.   Pt will  benefit from skilled therapeutic intervention in order to improve on the following deficits Decreased range of motion;Impaired UE functional use;Pain;Decreased strength;Decreased knowledge of precautions   Rehab Potential Excellent   Clinical Impairments Affecting Rehab Potential None   PT Frequency One time visit   PT Treatment/Interventions Therapeutic exercise;Patient/family education   Consulted and Agree with Plan of Care Patient       Patient will follow up at outpatient cancer rehab if needed following surgery.  If the patient requires physical therapy at that time, a specific plan will be dictated and sent to the referring physician for approval. The patient was educated today on appropriate basic range of motion exercises to begin post operatively and the importance of attending the After Breast Cancer class following surgery.  Patient was educated today on lymphedema risk reduction practices as it pertains to recommendations that will benefit the patient immediately following surgery.  She verbalized good understanding.  No additional physical therapy is indicated at this time.      Problem List Patient Active Problem List   Diagnosis Date Noted  . Breast cancer of upper-inner quadrant of right female breast 04/27/2015  . Hypertension, essential 04/27/2015  . Breast cancer of upper-outer quadrant of left female breast 04/21/2015    Annia Friendly, PT 04/27/2015 3:41 PM  [place], [place], [address] Phone: [phone]   Fax:  [phone]

## 2015-04-27 NOTE — Telephone Encounter (Signed)
Appointments made and avs printed for patient,solis dexa [phone]  2:00pm,order faxed

## 2015-04-28 ENCOUNTER — Inpatient Hospital Stay: Admission: RE | Admit: 2015-04-28 | Payer: No Typology Code available for payment source | Source: Ambulatory Visit

## 2015-05-03 ENCOUNTER — Telehealth: Payer: Self-pay | Admitting: *Deleted

## 2015-05-03 ENCOUNTER — Encounter: Payer: Self-pay | Admitting: General Practice

## 2015-05-03 NOTE — Telephone Encounter (Signed)
Spoke to pt concerning [place] from 9.21.16. Pt denies questions or concerns regarding dx or treatment care plan. Encourage pt to call with needs. Received verbal understanding. Contact information given.

## 2015-05-03 NOTE — Progress Notes (Signed)
[facility] Psychosocial Distress Screening Spiritual Care  Shadowed by Counseling Intern Vaughan Sine, met with Ms Lumb at [facility] to introduce [facility] team/resources, reviewing distress screen per protocol.  The patient scored a 10 on the Psychosocial Distress Thermometer which indicates severe distress. Also assessed for distress and other psychosocial needs.   ONCBCN DISTRESS SCREENING 05/03/2015  Screening Type Initial Screening  Distress experienced in past week (1-10) 10  Practical problem type Housing;Work/school  Family Problem type Other (comment)  Referral to clinical social work Yes  Referral to financial advocate Yes  Referral to support programs Yes  Other [place], Counseling Intern   In [facility] and during f/u call today, Mont[place]at describes herself as "in good spirits, so relieved, praising God" because she initially feared that a cancer diagnosis meant that she "start planning [her] funeral."  Per pt, beiing able take action via surgery and other tx has decreased her distress to a 4.  Per pt, other stressors include seeking new housing (lives with fiance, but wants to "live alone without dog and other germs"), limited income/financial resources (per pt, not enough hours to qualify for FMLA), and children's physical distance (they live in [place]).  Provided pastoral presence, reflective listening, introduction to Support Team and services.    Follow up needed: Yes.  Will refer to [facility] for mentor match, financial advocate and LCSW for financial/logistical questions.  Counseling intern plans to f/u by phone for support.  Please also page as needs arise.  Thank you.  [place], [place] Pager [phone] Voicemail  [phone]

## 2015-05-03 NOTE — Telephone Encounter (Signed)
Left vm for pt to return call regarding [place] from 9.21.16

## 2015-05-05 ENCOUNTER — Encounter: Payer: Self-pay | Admitting: *Deleted

## 2015-05-05 NOTE — Progress Notes (Signed)
[place] Work  Clinical Social Work was referred by Clinical biochemist, Harrell Lark assessment of psychosocial needs.  Clinical Social Worker attempted to contact pt at home to offer support and assess for needs. CSW left HIPPA compliant message for pt explaining role of CSW team and how we could assist. CSW also provided pt with financial counselors' contact as well. CSW available to assist as needed and awaits return call.   [name], [place] Worker [facility] Phone: [phone] Fax: [phone]

## 2015-05-06 ENCOUNTER — Encounter (HOSPITAL_COMMUNITY): Payer: Self-pay | Admitting: *Deleted

## 2015-05-06 NOTE — Progress Notes (Signed)
Pt denies cardiac history, chest pain or sob. 

## 2015-05-08 MED ORDER — DEXTROSE 5 % IV SOLN
3.0000 g | INTRAVENOUS | Status: AC
  Administered 2015-05-09: 3 g via INTRAVENOUS
  Filled 2015-05-08: qty 3000

## 2015-05-09 ENCOUNTER — Ambulatory Visit (HOSPITAL_COMMUNITY)
Admission: RE | Admit: 2015-05-09 | Discharge: 2015-05-09 | Disposition: A | Discharge status: Home / Self Care | Payer: No Typology Code available for payment source | Source: Ambulatory Visit | Attending: General Surgery | Admitting: General Surgery

## 2015-05-09 ENCOUNTER — Ambulatory Visit (HOSPITAL_COMMUNITY): Payer: No Typology Code available for payment source | Admitting: Anesthesiology

## 2015-05-09 ENCOUNTER — Encounter (HOSPITAL_COMMUNITY): Payer: Self-pay | Admitting: *Deleted

## 2015-05-09 ENCOUNTER — Encounter (HOSPITAL_COMMUNITY)
Admission: RE | Disposition: A | Discharge status: Home / Self Care | Payer: Self-pay | Source: Ambulatory Visit | Attending: General Surgery

## 2015-05-09 ENCOUNTER — Ambulatory Visit (HOSPITAL_COMMUNITY)
Admission: RE | Admit: 2015-05-09 | Discharge: 2015-05-13 | Disposition: A | Discharge status: Home / Self Care | Payer: No Typology Code available for payment source | Source: Ambulatory Visit | Attending: General Surgery | Admitting: General Surgery

## 2015-05-09 DIAGNOSIS — Z79899 Other long term (current) drug therapy: Secondary | ICD-10-CM | POA: Insufficient documentation

## 2015-05-09 DIAGNOSIS — Z170 Estrogen receptor positive status [ER+]: Secondary | ICD-10-CM | POA: Diagnosis not present

## 2015-05-09 DIAGNOSIS — C50911 Malignant neoplasm of unspecified site of right female breast: Secondary | ICD-10-CM | POA: Diagnosis present

## 2015-05-09 DIAGNOSIS — K219 Gastro-esophageal reflux disease without esophagitis: Secondary | ICD-10-CM | POA: Diagnosis not present

## 2015-05-09 DIAGNOSIS — C50912 Malignant neoplasm of unspecified site of left female breast: Principal | ICD-10-CM

## 2015-05-09 DIAGNOSIS — I10 Essential (primary) hypertension: Secondary | ICD-10-CM | POA: Insufficient documentation

## 2015-05-09 DIAGNOSIS — Z87891 Personal history of nicotine dependence: Secondary | ICD-10-CM | POA: Diagnosis not present

## 2015-05-09 DIAGNOSIS — C50919 Malignant neoplasm of unspecified site of unspecified female breast: Secondary | ICD-10-CM | POA: Diagnosis present

## 2015-05-09 DIAGNOSIS — C50211 Malignant neoplasm of upper-inner quadrant of right female breast: Secondary | ICD-10-CM | POA: Diagnosis not present

## 2015-05-09 DIAGNOSIS — C773 Secondary and unspecified malignant neoplasm of axilla and upper limb lymph nodes: Secondary | ICD-10-CM | POA: Insufficient documentation

## 2015-05-09 DIAGNOSIS — C50412 Malignant neoplasm of upper-outer quadrant of left female breast: Secondary | ICD-10-CM | POA: Diagnosis not present

## 2015-05-09 HISTORY — PX: MASTECTOMY MODIFIED RADICAL: SHX5962

## 2015-05-09 HISTORY — DX: Gastro-esophageal reflux disease without esophagitis: K21.9

## 2015-05-09 HISTORY — PX: MASTECTOMY W/ SENTINEL NODE BIOPSY: SHX2001

## 2015-05-09 LAB — BASIC METABOLIC PANEL
Anion gap: 8 (ref 5–15)
BUN: 15 mg/dL (ref 6–20)
CO2: 26 mmol/L (ref 22–32)
Calcium: 8.9 mg/dL (ref 8.9–10.3)
Chloride: 104 mmol/L (ref 101–111)
Creatinine, Ser: 0.87 mg/dL (ref 0.44–1.00)
GFR calc Af Amer: >60 mL/min (ref >60)
GFR calc non Af Amer: >60 mL/min (ref >60)
Glucose, Bld: 100 mg/dL — ABNORMAL HIGH (ref 65–99)
Potassium: 3.2 mmol/L — ABNORMAL LOW (ref 3.5–5.1)
Sodium: 138 mmol/L (ref 135–145)

## 2015-05-09 SURGERY — MASTECTOMY WITH SENTINEL LYMPH NODE BIOPSY
Anesthesia: General | Site: Breast | Laterality: Right

## 2015-05-09 MED ORDER — FENTANYL CITRATE (PF) 100 MCG/2ML IJ SOLN
INTRAMUSCULAR | Status: AC
  Administered 2015-05-09: 50 ug via INTRAVENOUS
  Filled 2015-05-09: qty 2

## 2015-05-09 MED ORDER — ROCURONIUM BROMIDE 50 MG/5ML IV SOLN
INTRAVENOUS | Status: AC
  Filled 2015-05-09: qty 1

## 2015-05-09 MED ORDER — FENTANYL CITRATE (PF) 100 MCG/2ML IJ SOLN
INTRAMUSCULAR | Status: DC | PRN
  Administered 2015-05-09 (×8): 50 ug via INTRAVENOUS

## 2015-05-09 MED ORDER — ONDANSETRON HCL 4 MG/2ML IJ SOLN
INTRAMUSCULAR | Status: AC
  Filled 2015-05-09: qty 2

## 2015-05-09 MED ORDER — FENTANYL CITRATE (PF) 250 MCG/5ML IJ SOLN
INTRAMUSCULAR | Status: AC
  Filled 2015-05-09: qty 5

## 2015-05-09 MED ORDER — ONDANSETRON 4 MG PO TBDP: 4.0000 mg | ORAL_TABLET | Freq: Four times a day (QID) | ORAL | Status: DC | PRN

## 2015-05-09 MED ORDER — LOSARTAN POTASSIUM 50 MG PO TABS
50.0000 mg | ORAL_TABLET | Freq: Every day | ORAL | Status: DC
  Administered 2015-05-10 – 2015-05-13 (×4): 50 mg via ORAL
  Filled 2015-05-09 (×4): qty 1

## 2015-05-09 MED ORDER — KCL IN DEXTROSE-NACL 20-5-0.9 MEQ/L-%-% IV SOLN
INTRAVENOUS | Status: DC
  Administered 2015-05-09 – 2015-05-10 (×3): via INTRAVENOUS
  Filled 2015-05-09 (×7): qty 1000

## 2015-05-09 MED ORDER — SODIUM CHLORIDE 0.9 % IJ SOLN
INTRAMUSCULAR | Status: AC
  Filled 2015-05-09: qty 10

## 2015-05-09 MED ORDER — MIDAZOLAM HCL 2 MG/2ML IJ SOLN
INTRAMUSCULAR | Status: AC
  Filled 2015-05-09: qty 4

## 2015-05-09 MED ORDER — ONDANSETRON HCL 4 MG/2ML IJ SOLN
INTRAMUSCULAR | Status: DC | PRN
  Administered 2015-05-09: 4 mg via INTRAVENOUS

## 2015-05-09 MED ORDER — SUCCINYLCHOLINE CHLORIDE 20 MG/ML IJ SOLN
INTRAMUSCULAR | Status: DC | PRN
  Administered 2015-05-09: 120 mg via INTRAVENOUS

## 2015-05-09 MED ORDER — EPHEDRINE SULFATE 50 MG/ML IJ SOLN
INTRAMUSCULAR | Status: AC
  Filled 2015-05-09: qty 1

## 2015-05-09 MED ORDER — NEOSTIGMINE METHYLSULFATE 10 MG/10ML IV SOLN
INTRAVENOUS | Status: AC
  Filled 2015-05-09: qty 1

## 2015-05-09 MED ORDER — MIDAZOLAM HCL 5 MG/5ML IJ SOLN
INTRAMUSCULAR | Status: DC | PRN
  Administered 2015-05-09 (×2): 1 mg via INTRAVENOUS

## 2015-05-09 MED ORDER — MORPHINE SULFATE (PF) 2 MG/ML IV SOLN
1.0000 mg | INTRAVENOUS | Status: DC | PRN
  Administered 2015-05-09 (×2): 4 mg via INTRAVENOUS
  Administered 2015-05-09 (×2): 2 mg via INTRAVENOUS
  Administered 2015-05-10 – 2015-05-11 (×7): 4 mg via INTRAVENOUS
  Administered 2015-05-11: 2 mg via INTRAVENOUS
  Administered 2015-05-11 – 2015-05-13 (×5): 4 mg via INTRAVENOUS
  Filled 2015-05-09: qty 2
  Filled 2015-05-09: qty 1
  Filled 2015-05-09 (×10): qty 2
  Filled 2015-05-09: qty 1
  Filled 2015-05-09 (×3): qty 2
  Filled 2015-05-09: qty 1
  Filled 2015-05-09: qty 2

## 2015-05-09 MED ORDER — CHLORHEXIDINE GLUCONATE 4 % EX LIQD: 1.0000 "application " | Freq: Once | CUTANEOUS | Status: DC

## 2015-05-09 MED ORDER — PANTOPRAZOLE SODIUM 40 MG IV SOLR
40.0000 mg | Freq: Every day | INTRAVENOUS | Status: DC
  Administered 2015-05-09: 40 mg via INTRAVENOUS
  Filled 2015-05-09: qty 40

## 2015-05-09 MED ORDER — GLYCOPYRROLATE 0.2 MG/ML IJ SOLN
INTRAMUSCULAR | Status: AC
  Filled 2015-05-09: qty 3

## 2015-05-09 MED ORDER — METOCLOPRAMIDE HCL 5 MG/ML IJ SOLN
INTRAMUSCULAR | Status: AC
  Filled 2015-05-09: qty 2

## 2015-05-09 MED ORDER — DEXAMETHASONE SODIUM PHOSPHATE 4 MG/ML IJ SOLN
INTRAMUSCULAR | Status: DC | PRN
  Administered 2015-05-09: 8 mg via INTRAVENOUS

## 2015-05-09 MED ORDER — TECHNETIUM TC 99M SULFUR COLLOID FILTERED
1.0000 | Freq: Once | INTRAVENOUS | Status: AC | PRN
  Administered 2015-05-09: 1 via INTRADERMAL

## 2015-05-09 MED ORDER — PHENYLEPHRINE HCL 10 MG/ML IJ SOLN
10.0000 mg | INTRAVENOUS | Status: DC | PRN
  Administered 2015-05-09: 5 ug/min via INTRAVENOUS

## 2015-05-09 MED ORDER — MIDAZOLAM HCL 5 MG/5ML IJ SOLN: INTRAMUSCULAR | Status: DC | PRN

## 2015-05-09 MED ORDER — FENTANYL CITRATE (PF) 100 MCG/2ML IJ SOLN
100.0000 ug | Freq: Once | INTRAMUSCULAR | Status: AC
Start: 2015-05-09 — End: 2015-05-09
  Administered 2015-05-09 (×2): 50 ug via INTRAVENOUS

## 2015-05-09 MED ORDER — ONDANSETRON HCL 4 MG/2ML IJ SOLN: 4.0000 mg | Freq: Four times a day (QID) | INTRAMUSCULAR | Status: DC | PRN

## 2015-05-09 MED ORDER — LACTATED RINGERS IV SOLN
INTRAVENOUS | Status: DC
  Administered 2015-05-09 (×3): via INTRAVENOUS

## 2015-05-09 MED ORDER — HEPARIN SODIUM (PORCINE) 5000 UNIT/ML IJ SOLN
5000.0000 [IU] | Freq: Three times a day (TID) | INTRAMUSCULAR | Status: DC
  Administered 2015-05-10 – 2015-05-13 (×10): 5000 [IU] via SUBCUTANEOUS
  Filled 2015-05-09 (×8): qty 1

## 2015-05-09 MED ORDER — FENTANYL CITRATE (PF) 100 MCG/2ML IJ SOLN
25.0000 ug | INTRAMUSCULAR | Status: DC | PRN
  Administered 2015-05-09 (×2): 50 ug via INTRAVENOUS

## 2015-05-09 MED ORDER — ARTIFICIAL TEARS OP OINT
TOPICAL_OINTMENT | OPHTHALMIC | Status: DC | PRN
  Administered 2015-05-09: 1 via OPHTHALMIC

## 2015-05-09 MED ORDER — ONDANSETRON HCL 4 MG/2ML IJ SOLN: 4.0000 mg | Freq: Once | INTRAMUSCULAR | Status: DC | PRN

## 2015-05-09 MED ORDER — OXYCODONE HCL 5 MG PO TABS: 5.0000 mg | ORAL_TABLET | Freq: Once | ORAL | Status: DC | PRN

## 2015-05-09 MED ORDER — 0.9 % SODIUM CHLORIDE (POUR BTL) OPTIME
TOPICAL | Status: DC | PRN
  Administered 2015-05-09 (×3): 1000 mL

## 2015-05-09 MED ORDER — MIDAZOLAM HCL 2 MG/2ML IJ SOLN
2.0000 mg | Freq: Once | INTRAMUSCULAR | Status: AC
  Administered 2015-05-09 (×2): 1 mg via INTRAVENOUS

## 2015-05-09 MED ORDER — PHENYLEPHRINE HCL 10 MG/ML IJ SOLN
INTRAMUSCULAR | Status: DC | PRN
  Administered 2015-05-09: 80 ug via INTRAVENOUS
  Administered 2015-05-09: 40 ug via INTRAVENOUS

## 2015-05-09 MED ORDER — PROPOFOL 10 MG/ML IV BOLUS
INTRAVENOUS | Status: DC | PRN
  Administered 2015-05-09: 170 mg via INTRAVENOUS
  Administered 2015-05-09: 30 mg via INTRAVENOUS
  Administered 2015-05-09: 50 mg via INTRAVENOUS

## 2015-05-09 MED ORDER — ARTIFICIAL TEARS OP OINT
TOPICAL_OINTMENT | OPHTHALMIC | Status: AC
  Filled 2015-05-09: qty 3.5

## 2015-05-09 MED ORDER — PHENYLEPHRINE 40 MCG/ML (10ML) SYRINGE FOR IV PUSH (FOR BLOOD PRESSURE SUPPORT)
PREFILLED_SYRINGE | INTRAVENOUS | Status: AC
  Filled 2015-05-09: qty 10

## 2015-05-09 MED ORDER — LIDOCAINE HCL (CARDIAC) 20 MG/ML IV SOLN
INTRAVENOUS | Status: AC
  Filled 2015-05-09: qty 5

## 2015-05-09 MED ORDER — DEXAMETHASONE SODIUM PHOSPHATE 4 MG/ML IJ SOLN
INTRAMUSCULAR | Status: AC
Start: 2015-05-09 — End: 2015-05-09
  Filled 2015-05-09: qty 2

## 2015-05-09 MED ORDER — MIDAZOLAM HCL 2 MG/2ML IJ SOLN
INTRAMUSCULAR | Status: AC
Start: 2015-05-09 — End: 2015-05-09
  Administered 2015-05-09: 1 mg via INTRAVENOUS
  Filled 2015-05-09: qty 2

## 2015-05-09 MED ORDER — SUCCINYLCHOLINE CHLORIDE 20 MG/ML IJ SOLN
INTRAMUSCULAR | Status: AC
  Filled 2015-05-09: qty 1

## 2015-05-09 MED ORDER — METHYLENE BLUE 1 % INJ SOLN
INTRAMUSCULAR | Status: AC
  Filled 2015-05-09: qty 10

## 2015-05-09 MED ORDER — OXYCODONE HCL 5 MG/5ML PO SOLN: 5.0000 mg | Freq: Once | ORAL | Status: DC | PRN

## 2015-05-09 MED ORDER — PROPOFOL 10 MG/ML IV BOLUS
INTRAVENOUS | Status: AC
  Filled 2015-05-09: qty 20

## 2015-05-09 MED ORDER — METOCLOPRAMIDE HCL 5 MG/ML IJ SOLN
INTRAMUSCULAR | Status: DC | PRN
  Administered 2015-05-09: 5 mg via INTRAVENOUS

## 2015-05-09 MED ORDER — OXYCODONE-ACETAMINOPHEN 5-325 MG PO TABS
1.0000 | ORAL_TABLET | ORAL | Status: DC | PRN
  Administered 2015-05-09 – 2015-05-13 (×15): 2 via ORAL
  Filled 2015-05-09 (×15): qty 2

## 2015-05-09 SURGICAL SUPPLY — 56 items
APPLIER CLIP 9.375 MED OPEN (MISCELLANEOUS) ×9
APR CLP MED 9.3 20 MLT OPN (MISCELLANEOUS) ×6
BINDER BREAST LRG (GAUZE/BANDAGES/DRESSINGS) IMPLANT
BINDER BREAST XLRG (GAUZE/BANDAGES/DRESSINGS) IMPLANT
BINDER BREAST XXLRG (GAUZE/BANDAGES/DRESSINGS) ×1 IMPLANT
BIOPATCH RED 1 DISK 7.0 (GAUZE/BANDAGES/DRESSINGS) ×4 IMPLANT
CANISTER SUCTION 2500CC (MISCELLANEOUS) ×6 IMPLANT
CHLORAPREP W/TINT 26ML (MISCELLANEOUS) ×4 IMPLANT
CLIP APPLIE 9.375 MED OPEN (MISCELLANEOUS) ×2 IMPLANT
CONT SPEC 4OZ CLIKSEAL STRL BL (MISCELLANEOUS) ×3 IMPLANT
COVER PROBE W GEL 5X96 (DRAPES) ×3 IMPLANT
COVER SURGICAL LIGHT HANDLE (MISCELLANEOUS) ×3 IMPLANT
DEVICE DISSECT PLASMABLAD 3.0S (MISCELLANEOUS) ×2 IMPLANT
DRAIN CHANNEL 19F RND (DRAIN) ×6 IMPLANT
DRAPE LAPAROSCOPIC ABDOMINAL (DRAPES) ×2 IMPLANT
DRAPE ORTHO SPLIT 77X108 STRL (DRAPES) ×6
DRAPE SURG ORHT 6 SPLT 77X108 (DRAPES) IMPLANT
DRAPE UTILITY XL STRL (DRAPES) ×4 IMPLANT
DRSG PAD ABDOMINAL 8X10 ST (GAUZE/BANDAGES/DRESSINGS) ×6 IMPLANT
DRSG TEGADERM 4X4.75 (GAUZE/BANDAGES/DRESSINGS) ×2 IMPLANT
ELECT CAUTERY BLADE 6.4 (BLADE) ×3 IMPLANT
ELECT REM PT RETURN 9FT ADLT (ELECTROSURGICAL) ×6
ELECTRODE REM PT RTRN 9FT ADLT (ELECTROSURGICAL) ×2 IMPLANT
EVACUATOR SILICONE 100CC (DRAIN) ×6 IMPLANT
GAUZE SPONGE 4X4 12PLY STRL (GAUZE/BANDAGES/DRESSINGS) ×2 IMPLANT
GAUZE XEROFORM 5X9 LF (GAUZE/BANDAGES/DRESSINGS) ×2 IMPLANT
GLOVE BIO SURGEON STRL SZ7.5 (GLOVE) ×2 IMPLANT
GLOVE BIOGEL PI IND STRL 7.5 (GLOVE) IMPLANT
GLOVE BIOGEL PI IND STRL 8 (GLOVE) IMPLANT
GLOVE BIOGEL PI INDICATOR 7.5 (GLOVE) ×1
GLOVE BIOGEL PI INDICATOR 8 (GLOVE)
GLOVE SURG SS PI 7.0 STRL IVOR (GLOVE) ×2 IMPLANT
GLOVE SURG SS PI 7.5 STRL IVOR (GLOVE) ×3 IMPLANT
GOWN STRL REUS W/ TWL LRG LVL3 (GOWN DISPOSABLE) ×4 IMPLANT
GOWN STRL REUS W/TWL LRG LVL3 (GOWN DISPOSABLE) ×6
KIT BASIN OR (CUSTOM PROCEDURE TRAY) ×3 IMPLANT
KIT ROOM TURNOVER OR (KITS) ×3 IMPLANT
LIQUID BAND (GAUZE/BANDAGES/DRESSINGS) ×4 IMPLANT
NDL 18GX1X1/2 (RX/OR ONLY) (NEEDLE) IMPLANT
NDL HYPO 25GX1X1/2 BEV (NEEDLE) IMPLANT
NEEDLE 18GX1X1/2 (RX/OR ONLY) (NEEDLE) IMPLANT
NEEDLE HYPO 25GX1X1/2 BEV (NEEDLE) IMPLANT
NS IRRIG 1000ML POUR BTL (IV SOLUTION) ×5 IMPLANT
PACK GENERAL/GYN (CUSTOM PROCEDURE TRAY) ×3 IMPLANT
PAD ARMBOARD 7.5X6 YLW CONV (MISCELLANEOUS) ×3 IMPLANT
PLASMABLADE 3.0S (MISCELLANEOUS) ×3
SPECIMEN JAR X LARGE (MISCELLANEOUS) ×4 IMPLANT
SUT ETHILON 3 0 FSL (SUTURE) ×6 IMPLANT
SUT MON AB 4-0 PC3 18 (SUTURE) ×6 IMPLANT
SUT VIC AB 3-0 54X BRD REEL (SUTURE) IMPLANT
SUT VIC AB 3-0 BRD 54 (SUTURE)
SUT VIC AB 3-0 SH 18 (SUTURE) ×5 IMPLANT
SYR CONTROL 10ML LL (SYRINGE) IMPLANT
TOWEL OR 17X24 6PK STRL BLUE (TOWEL DISPOSABLE) ×2 IMPLANT
TOWEL OR 17X26 10 PK STRL BLUE (TOWEL DISPOSABLE) ×3 IMPLANT
TUBE CONNECTING 12X1/4 (SUCTIONS) ×3 IMPLANT

## 2015-05-09 NOTE — H&P (Signed)
Margaret [name] 04/27/2015 8:26 AM Location: [facility] Patient #: [id number] DOB: Mar 11, 1961 Undefined / Language: Cleophus Molt / Race: Black or African American Female  History of Present Illness Sammuel Hines. Marlou Starks MD; 04/27/2015 3:20 PM) The patient is a 54 year old female who presents with breast cancer. We're asked to see the patient in consultation by Dr. Barbette Hair to evaluate her for bilateral breast cancer. The patient is a 54 year old black female who states that she has not been to doctors in years. She has always had tenderness in both breasts. In December of last year she first noticed that her left breast was getting smaller. She states that she never felt a mass in the breast. She has no family history of breast cancer and does not take hormone replacement. Her mammogram and ultrasound showed a small mass in the upper portion of the right breast and a large mass in 2 different quadrants of the left breast as well as an abnormal appearing lymph node in the left axilla. All of these lesions were biopsied and came back positive for breast cancer. The right side was ER and PR positive and HER-2 negative with a Ki-67 of 10%. The left side was also ER/PR positive and HER-2 negative with a Ki-67 of 5%.   Other Problems Patsey Berthold, [place]; 04/27/2015 8:27 AM) Breast Cancer Gastroesophageal Reflux Disease High blood pressure Lump In Breast Oophorectomy  Past Surgical History Patsey Berthold, [place]; 04/27/2015 8:27 AM) Breast Biopsy Bilateral. Hysterectomy (not due to cancer) - Complete Oral Surgery  Diagnostic Studies History Patsey Berthold, [place]; 04/27/2015 8:27 AM) Colonoscopy never Mammogram within last year Pap Smear >5 years ago  Medication History Patsey Berthold, [place]; 04/27/2015 8:27 AM) No Current Medications Medications Reconciled  Social History Patsey Berthold, CMA; 04/27/2015 8:27 AM) Alcohol use Moderate alcohol use. Caffeine use Coffee. No drug use Tobacco  use Former smoker.  Family History Patsey Berthold, [place]; 04/27/2015 8:27 AM) Arthritis Mother. Depression Daughter. Diabetes Mellitus Mother. Hypertension Mother.  Pregnancy / Birth History Patsey Berthold, [place]; 04/27/2015 8:27 AM) Age at menarche 70 years. Age of menopause <45 Gravida 4 Irregular periods Maternal age 58-20 Para 4  Review of Systems Patsey Berthold CMA; 04/27/2015 8:27 AM) General Not Present- Appetite Loss, Chills, Fatigue, Fever, Night Sweats, Weight Gain and Weight Loss. Skin Not Present- Change in Wart/Mole, Dryness, Hives, Jaundice, New Lesions, Non-Healing Wounds, Rash and Ulcer. HEENT Present- Wears glasses/contact lenses. Not Present- Earache, Hearing Loss, Hoarseness, Nose Bleed, Oral Ulcers, Ringing in the Ears, Seasonal Allergies, Sinus Pain, Sore Throat, Visual Disturbances and Yellow Eyes. Respiratory Not Present- Bloody sputum, Chronic Cough, Difficulty Breathing, Snoring and Wheezing. Breast Present- Breast Mass, Breast Pain and Skin Changes. Not Present- Nipple Discharge. Cardiovascular Not Present- Chest Pain, Difficulty Breathing Lying Down, Leg Cramps, Palpitations, Rapid Heart Rate, Shortness of Breath and Swelling of Extremities. Gastrointestinal Not Present- Abdominal Pain, Bloating, Bloody Stool, Change in Bowel Habits, Chronic diarrhea, Constipation, Difficulty Swallowing, Excessive gas, Gets full quickly at meals, Hemorrhoids, Indigestion, Nausea, Rectal Pain and Vomiting. Female Genitourinary Present- Frequency and Nocturia. Not Present- Painful Urination, Pelvic Pain and Urgency. Musculoskeletal Not Present- Back Pain, Joint Pain, Joint Stiffness, Muscle Pain, Muscle Weakness and Swelling of Extremities. Psychiatric Present- Anxiety and Fearful. Not Present- Bipolar, Change in Sleep Pattern, Depression and Frequent crying. Endocrine Present- Hot flashes. Not Present- Cold Intolerance, Excessive Hunger, Hair Changes, Heat Intolerance and New  Diabetes. Hematology Not Present- Easy Bruising, Excessive bleeding, Gland problems, HIV and Persistent Infections.   Physical Exam (  Sammuel Hines. Marlou Starks MD; 04/27/2015 3:21 PM) General Mental Status-Alert. General Appearance-Consistent with stated age. Hydration-Well hydrated. Voice-Normal.  Head and Neck Head-normocephalic, atraumatic with no lesions or palpable masses. Trachea-midline. Thyroid Gland Characteristics - normal size and consistency.  Eye Eyeball - Bilateral-Extraocular movements intact. Sclera/Conjunctiva - Bilateral-No scleral icterus.  Chest and Lung Exam Chest and lung exam reveals -quiet, even and easy respiratory effort with no use of accessory muscles and on auscultation, normal breath sounds, no adventitious sounds and normal vocal resonance. Inspection Chest Wall - Normal. Back - normal.  Breast Note: There is no palpable mass in the right breast. There is a large central mass encompassing most of the left breast. There is some tethering to the skin in the upper outer quadrant. There is no peau d'orange changes. There is no palpable axillary, supraclavicular, or cervical lymphadenopathy although one lymph node biopsied in the left axilla was positive.   Cardiovascular Cardiovascular examination reveals -normal heart sounds, regular rate and rhythm with no murmurs and normal pedal pulses bilaterally.  Abdomen Inspection Inspection of the abdomen reveals - No Hernias. Skin - Scar - no surgical scars. Palpation/Percussion Palpation and Percussion of the abdomen reveal - Soft, Non Tender, No Rebound tenderness, No Rigidity (guarding) and No hepatosplenomegaly. Auscultation Auscultation of the abdomen reveals - Bowel sounds normal.  Neurologic Neurologic evaluation reveals -alert and oriented x 3 with no impairment of recent or remote memory. Mental Status-Normal.  Musculoskeletal Normal Exam - Left-Upper Extremity Strength Normal and  Lower Extremity Strength Normal. Normal Exam - Right-Upper Extremity Strength Normal and Lower Extremity Strength Normal.  Lymphatic Head & Neck  General Head & Neck Lymphatics: Bilateral - Description - Normal. Axillary  General Axillary Region: Bilateral - Description - Normal. Tenderness - Non Tender. Femoral & Inguinal  Generalized Femoral & Inguinal Lymphatics: Bilateral - Description - Normal. Tenderness - Non Tender.    Assessment & Plan Eddie Dibbles S. Marlou Starks MD; 04/27/2015 3:24 PM) BILATERAL BREAST CANCER (C50.911) BILATERAL MALIGNANT NEOPLASM OF CENTRAL PORTION OF BREAST IN FEMALE (C50.111) Impression: The patient appears to have a small stage I cancer in the right breast and a much larger cancer in the left breast with a positive left axillary node. I have talked to her in detail about the different options for treatment and at this point she favors bilateral mastectomies. She would need a left axillary lymph node dissection and would be a good candidate for sentinel node mapping on the right. She does not desire reconstruction at this point. I have discussed with her in detail the risks and benefits of the operation to do this as well as some of the technical aspects and she understands and wishes to proceed. She also needs to quit smoking to minimize her risk of complication. Current Plans  Pt Education - Breast Cancer: discussed with patient and provided information.   Signed by Luella Cook, MD (04/27/2015 3:24 PM)

## 2015-05-09 NOTE — Anesthesia Postprocedure Evaluation (Signed)
  Anesthesia Post-op Note  Patient: Margaret [name]  Procedure(s) Performed: Procedure(s): RIGHT MASTECTOMY WITH RIGHT AXILLARY SENTINEL LYMPH NODE BIOPSY (Right) LEFT MODIFIED RADICAL MASTETCTOMY (Left)  Patient Location: PACU  Anesthesia Type:General and GA combined with regional for post-op pain  Level of Consciousness: awake, alert  and oriented  Airway and Oxygen Therapy: Patient Spontanous Breathing and Patient connected to nasal cannula oxygen  Post-op Pain: mild  Post-op Assessment: Post-op Vital signs reviewed, Patient's Cardiovascular Status Stable, Respiratory Function Stable, Patent Airway and Pain level controlled              Post-op Vital Signs: stable  Last Vitals:  Filed Vitals:   05/09/15 1430  BP:   Pulse: 86  Temp: 36.7 C  Resp: 15    Complications: No apparent anesthesia complications

## 2015-05-09 NOTE — Transfer of Care (Signed)
Immediate Anesthesia Transfer of Care Note  Patient: Margaret [name]  Procedure(s) Performed: Procedure(s): RIGHT MASTECTOMY WITH RIGHT AXILLARY SENTINEL LYMPH NODE BIOPSY (Right) LEFT MODIFIED RADICAL MASTETCTOMY (Left)  Patient Location: PACU  Anesthesia Type:General  Level of Consciousness: awake, alert , oriented and patient cooperative  Airway & Oxygen Therapy: Patient Spontanous Breathing and Patient connected to nasal cannula oxygen  Post-op Assessment: Report given to RN, Post -op Vital signs reviewed and stable and Patient moving all extremities  Post vital signs: Reviewed and stable  Last Vitals:  Filed Vitals:   05/09/15 1312  BP:   Pulse:   Temp: 36.8 C  Resp:     Complications: No apparent anesthesia complications

## 2015-05-09 NOTE — Op Note (Signed)
05/09/2015  12:54 PM  PATIENT:  Margaret [name]  54 y.o. female  PRE-OPERATIVE DIAGNOSIS:  BILATERAL BREAST CANCER  POST-OPERATIVE DIAGNOSIS:  BILATERAL BREAST CANCER  PROCEDURE:  Procedure(s): RIGHT MASTECTOMY WITH RIGHT AXILLARY SENTINEL LYMPH NODE BIOPSY (Right) LEFT MODIFIED RADICAL MASTETCTOMY (Left)  SURGEON:  Surgeon(s) and Role:    * Jovita Kussmaul, MD - Primary  PHYSICIAN ASSISTANT:   ASSISTANTS: Sharyn Dross, RNFA   ANESTHESIA:   general  EBL:  Total I/O In: 2000 [I.V.:2000] Out: 220 [Urine:130; Blood:90]  BLOOD ADMINISTERED:none  DRAINS: (4) Jackson-Pratt drain(s) with closed bulb suction in the prepectoral space   LOCAL MEDICATIONS USED:  NONE  SPECIMEN:  Source of Specimen:  left mastectomy and axillary contents and right mastectomy and sentinel node  DISPOSITION OF SPECIMEN:  PATHOLOGY  COUNTS:  YES  TOURNIQUET:  * No tourniquets in log *  DICTATION: .Dragon Dictation   After informed consent was obtained the patient was brought to the operating room and placed in supine position on the operating room table. After adequate induction of general anesthesia the patient's bilateral chest, breast, and axillary areas were prepped with ChloraPrep, allowed to dry, and draped in usual sterile manner. Earlier in the day the patient underwent injection of 1 mCi of technetium sulfur colloid in the subareolar position on the right. At this point attention was first turned to the left breast. An elliptical incision was made around the nipple and areola complex in order to minimize the excess skin. The incision was carried through the skin and subcutaneous tissue sharply with the plasma blade. Breast hooks were used to elevate the skin flaps anteriorly towards the ceiling. Thin skin flaps were created circumferentially between the breast tissue in the subcutaneous fat until the dissection reached the chest wall. The breast was then removed from the pectoralis muscle with the  pectoralis fascia also sharply with the plasma blade. Laterally once the axilla was entered the serratus muscle medially, the latissimus muscle laterally, and the axillary vein superiorly were identified. The axillary contents within these boundaries were dissected out by blunt right angle dissection. Small vessels were controlled with clips. Intercostal brachial nerves were also controlled with clips. The long thoracic and thoracodorsal neurovascular complexes were identified and spared. Once this dissection was accomplished the breast with the axillary contents attached were removed and sent to pathology for further evaluation. The wound was irrigated with copious amounts of saline. Hemostasis was achieved using the plasma blade. 2 small stab incisions were made in the anterior axillary line beneath the operative area with a 15 blade knife. A tonsil clamp was placed through each of these incisions and used to bring 19 [place] round Blake drains into the operative bed. The lateral drain was placed in the axilla and the medial drain was curled along the chest wall. The drains were anchored to the skin with 3-0 nylon stitches. The superior and inferior flaps were grossly reapproximated with interrupted 3-0 Vicryl stitches. The skin was then closed with a running 4-0 Monocryl subcuticular stitch. Attention was then turned to the right breast. A similar elliptical incision was made with a 10 blade knife around the nipple and areola complex. The incision was carried through the skin and subcutaneous tissue sharply with the plasma blade. Breast x-ray used to elevate the skin flaps anteriorly towards the ceiling and thin skin flaps were created circumferentially between the breast tissue and the subcutaneous fat until the dissection reached the chest wall. The breast was then removed from  the pectoralis muscle with the pectoralis fascia also sharply with the plasma blade. Once the breast was removed it was oriented with a  stitch on the lateral aspect as was the left side and sent pathology for further evaluation. The neoprobe was then used to identify a hot lymph node in the right axilla. This was excised sharply with the plasma blade. Touch preps on this sentinel node #1 were negative. The wound was then irrigated with copious amounts of saline and hemostasis was achieved using the Bovie electrocautery. 2 small stab incisions were made in the anterior axillary line inferior to the operative area with a 15 blade knife. A tonsil clamp was placed through each of these openings and used to bring 19 [place] round Blake drains into the operative bed. The lateral drain was placed in the axilla and the medial drain was curled along the chest wall. The drains were anchored to the skin with 3-0 nylon stitches. The superior and inferior skin flaps were then grossly reapproximated with interrupted 3-0 Vicryl stitches. The skin was then closed with a running 4-0 Monocryl subcuticular stitch. Dermabond dressings were then applied along with sterile dressings. The patient tolerated the procedure well. At the end of the case all needle sponge and instrument counts were correct. The patient was then awakened and taken to recovery in stable condition.  PLAN OF CARE: Admit for overnight observation  PATIENT DISPOSITION:  PACU - hemodynamically stable.   Delay start of Pharmacological VTE agent (>24hrs) due to surgical blood loss or risk of bleeding: no

## 2015-05-09 NOTE — Anesthesia Procedure Notes (Addendum)
Procedure Name: Intubation Date/Time: 05/09/2015 9:30 AM Performed by: Willeen Cass P Pre-anesthesia Checklist: Patient identified, Timeout performed, Emergency Drugs available, Suction available and Patient being monitored Patient Re-evaluated:Patient Re-evaluated prior to inductionOxygen Delivery Method: Circle system utilized Preoxygenation: Pre-oxygenation with 100% oxygen Intubation Type: IV induction and Rapid sequence Laryngoscope Size: Mac and 4 Grade View: Grade I Tube type: Oral Tube size: 7.0 mm Number of attempts: 1 Airway Equipment and Method: Stylet Placement Confirmation: ETT inserted through vocal cords under direct vision,  breath sounds checked- equal and bilateral and positive ETCO2 Secured at: 1 cm Tube secured with: Tape Dental Injury: Teeth and Oropharynx as per pre-operative assessment    Anesthesia Regional Block:  Pectoralis block  Pre-Anesthetic Checklist: ,, timeout performed, Correct Patient, Correct Site, Correct Laterality, Correct Procedure, Correct Position, site marked, Risks and benefits discussed,  Surgical consent,  Pre-op evaluation,  At surgeon's request and post-op pain management  Laterality: Left  Prep: chloraprep       Needles:  Injection technique: Single-shot  Needle Type: Echogenic Stimulator Needle     Needle Length: 9cm 9 cm Needle Gauge: 21 and 21 G    Additional Needles:  Procedures: ultrasound guided (picture in chart) Pectoralis block Narrative:  Start time: 05/09/2015 10:35 AM End time: 05/09/2015 10:40 AM Injection made incrementally with aspirations every 5 mL.  Performed by: Personally   Additional Notes: 25 cc 0.5% Bupivacaine with 1:200 epi injected easily

## 2015-05-09 NOTE — Anesthesia Preprocedure Evaluation (Addendum)
Anesthesia Evaluation  Patient identified by MRN, date of birth, ID band Patient awake    Reviewed: Allergy & Precautions, NPO status , Patient's Chart, lab work & pertinent test results  History of Anesthesia Complications Negative for: history of anesthetic complications  Airway Mallampati: II  TM Distance: >3 FB Neck ROM: Full    Dental  (+) Teeth Intact, Dental Advisory Given   Pulmonary neg pulmonary ROS, Current Smoker,    Pulmonary exam normal        Cardiovascular hypertension, Pt. on medications Normal cardiovascular exam Rhythm:Regular Rate:Normal     Neuro/Psych negative neurological ROS  negative psych ROS   GI/Hepatic Neg liver ROS, GERD  Poorly Controlled,  Endo/Other  negative endocrine ROS  Renal/GU negative Renal ROS     Musculoskeletal negative musculoskeletal ROS (+)   Abdominal   Peds  Hematology negative hematology ROS (+)   Anesthesia Other Findings   Reproductive/Obstetrics                          Anesthesia Physical Anesthesia Plan  ASA: III  Anesthesia Plan: General   Post-op Pain Management:    Induction: Intravenous  Airway Management Planned: Oral ETT  Additional Equipment:   Intra-op Plan:   Post-operative Plan: Extubation in OR  Informed Consent: I have reviewed the patients History and Physical, chart, labs and discussed the procedure including the risks, benefits and alternatives for the proposed anesthesia with the patient or authorized representative who has indicated his/her understanding and acceptance.   Dental advisory given, History available from chart only and Only emergency history available  Plan Discussed with: CRNA, Anesthesiologist and Surgeon  Anesthesia Plan Comments:         Anesthesia Quick Evaluation

## 2015-05-09 NOTE — Progress Notes (Signed)
Dr Elayne Snare called and asked where to place iv, states to put it in the right.

## 2015-05-09 NOTE — Interval H&P Note (Signed)
History and Physical Interval Note:  05/09/2015 9:01 AM  Margaret [name]  has presented today for surgery, with the diagnosis of BILATERAL BREAST CANCER  The various methods of treatment have been discussed with the patient and family. After consideration of risks, benefits and other options for treatment, the patient has consented to  Procedure(s): RIGHT MASTECTOMY WITH SENTINEL LYMPH NODE BIOPSY AND LEFT MODIFIED RADICAL MASTECTOMY  (Bilateral) as a surgical intervention .  The patient's history has been reviewed, patient examined, no change in status, stable for surgery.  I have reviewed the patient's chart and labs.  Questions were answered to the patient's satisfaction.     TOTH III,PAUL S

## 2015-05-10 ENCOUNTER — Encounter (HOSPITAL_COMMUNITY): Payer: Self-pay | Admitting: General Surgery

## 2015-05-10 DIAGNOSIS — C50412 Malignant neoplasm of upper-outer quadrant of left female breast: Secondary | ICD-10-CM | POA: Diagnosis not present

## 2015-05-10 LAB — CBC
HCT: 37.2 % (ref 36.0–46.0)
Hemoglobin: 12.2 g/dL (ref 12.0–15.0)
MCH: 29.3 pg (ref 26.0–34.0)
MCHC: 32.8 g/dL (ref 30.0–36.0)
MCV: 89.4 fL (ref 78.0–100.0)
Platelets: 215 10*3/uL (ref 150–400)
RBC: 4.16 MIL/uL (ref 3.87–5.11)
RDW: 14.6 % (ref 11.5–15.5)
WBC: 15.9 10*3/uL — ABNORMAL HIGH (ref 4.0–10.5)

## 2015-05-10 LAB — BASIC METABOLIC PANEL
Anion gap: 4 — ABNORMAL LOW (ref 5–15)
BUN: 14 mg/dL (ref 6–20)
CO2: 27 mmol/L (ref 22–32)
Calcium: 8.7 mg/dL — ABNORMAL LOW (ref 8.9–10.3)
Chloride: 108 mmol/L (ref 101–111)
Creatinine, Ser: 0.91 mg/dL (ref 0.44–1.00)
GFR calc Af Amer: >60 mL/min (ref >60)
GFR calc non Af Amer: >60 mL/min (ref >60)
Glucose, Bld: 126 mg/dL — ABNORMAL HIGH (ref 65–99)
Potassium: 3.6 mmol/L (ref 3.5–5.1)
Sodium: 139 mmol/L (ref 135–145)

## 2015-05-10 MED ORDER — PANTOPRAZOLE SODIUM 40 MG PO TBEC
40.0000 mg | DELAYED_RELEASE_TABLET | Freq: Every day | ORAL | Status: DC
  Administered 2015-05-10 – 2015-05-12 (×3): 40 mg via ORAL
  Filled 2015-05-10 (×3): qty 1

## 2015-05-10 NOTE — Progress Notes (Signed)
Night RN states that MD has approved IV in right hand at this time- will continue to monitor

## 2015-05-10 NOTE — Progress Notes (Signed)
1 Day Post-Op  Subjective: Complains of pain. Glad it is over  Objective: Vital signs in last 24 hours: Temp:  [97.9 F (36.6 C)-98.7 F (37.1 C)] 97.9 F (36.6 C) (10/04 0415) Pulse Rate:  [71-102] 83 (10/04 0415) Resp:  [10-20] 19 (10/04 0415) BP: (159-186)/(72-123) 163/86 mmHg (10/04 0415) SpO2:  [94 %-100 %] 98 % (10/04 0415) Last BM Date:  (PTA)  Intake/Output from previous day: 10/03 0701 - 10/04 0700 In: 2252 [I.V.:2000] Out: 860 [Urine:630; Drains:140; Blood:90] Intake/Output this shift: Total I/O In: -  Out: 55 [Drains:55]  Resp: clear to auscultation bilaterally Chest wall: skin flaps look good. drain output serosanguinous Cardio: regular rate and rhythm GI: soft, non-tender; bowel sounds normal; no masses,  no organomegaly  Lab Results:   Recent Labs  05/10/15 0507  WBC 15.9*  HGB 12.2  HCT 37.2  PLT 215   BMET  Recent Labs  05/09/15 0746 05/10/15 0507  NA 138 139  K 3.2* 3.6  CL 104 108  CO2 26 27  GLUCOSE 100* 126*  BUN 15 14  CREATININE 0.87 0.91  CALCIUM 8.9 8.7*   PT/INR No results for input(s): LABPROT, INR in the last 72 hours. ABG No results for input(s): PHART, HCO3 in the last 72 hours.  Invalid input(s): PCO2, PO2  Studies/Results: Nm Sentinel Node Inj-no Rpt (breast)  05/09/2015   CLINICAL DATA: bilateral breast cancer but mapping on right only   Sulfur colloid was injected intradermally by the nuclear medicine  technologist for breast cancer sentinel node localization.     Anti-infectives: Anti-infectives    Start     Dose/Rate Route Frequency Ordered Stop   05/09/15 0830  ceFAZolin (ANCEF) 3 g in dextrose 5 % 50 mL IVPB     3 g 160 mL/hr over 30 Minutes Intravenous To ShortStay Surgical 05/08/15 1213 05/09/15 0943      Assessment/Plan: s/p Procedure(s): RIGHT MASTECTOMY WITH RIGHT AXILLARY SENTINEL LYMPH NODE BIOPSY (Right) LEFT MODIFIED RADICAL MASTETCTOMY (Left) Advance diet  Teach drain care Work on pain  control Plan for discharge tomorrow     TOTH III,[place] Clowers S 05/10/2015

## 2015-05-11 ENCOUNTER — Other Ambulatory Visit: Payer: Self-pay | Admitting: Oncology

## 2015-05-11 DIAGNOSIS — C50412 Malignant neoplasm of upper-outer quadrant of left female breast: Secondary | ICD-10-CM | POA: Diagnosis not present

## 2015-05-11 LAB — BASIC METABOLIC PANEL
Anion gap: 10 (ref 5–15)
BUN: 10 mg/dL (ref 6–20)
CO2: 26 mmol/L (ref 22–32)
Calcium: 8.4 mg/dL — ABNORMAL LOW (ref 8.9–10.3)
Chloride: 101 mmol/L (ref 101–111)
Creatinine, Ser: 0.74 mg/dL (ref 0.44–1.00)
GFR calc Af Amer: >60 mL/min (ref >60)
GFR calc non Af Amer: >60 mL/min (ref >60)
Glucose, Bld: 88 mg/dL (ref 65–99)
Potassium: 3.3 mmol/L — ABNORMAL LOW (ref 3.5–5.1)
Sodium: 137 mmol/L (ref 135–145)

## 2015-05-11 LAB — CBC
HCT: 38.5 % (ref 36.0–46.0)
Hemoglobin: 12.4 g/dL (ref 12.0–15.0)
MCH: 29 pg (ref 26.0–34.0)
MCHC: 32.2 g/dL (ref 30.0–36.0)
MCV: 90.2 fL (ref 78.0–100.0)
Platelets: 205 10*3/uL (ref 150–400)
RBC: 4.27 MIL/uL (ref 3.87–5.11)
RDW: 14.7 % (ref 11.5–15.5)
WBC: 10.3 10*3/uL (ref 4.0–10.5)

## 2015-05-11 MED ORDER — METHOCARBAMOL 500 MG PO TABS
500.0000 mg | ORAL_TABLET | Freq: Three times a day (TID) | ORAL | Status: DC
  Administered 2015-05-11 – 2015-05-13 (×7): 500 mg via ORAL
  Filled 2015-05-11 (×7): qty 1

## 2015-05-11 NOTE — Progress Notes (Signed)
2 Days Post-Op  Subjective: Still complains of pain requiring IV pain meds  Objective: Vital signs in last 24 hours: Temp:  [97.7 F (36.5 C)-98.6 F (37 C)] 98.6 F (37 C) (10/05 0604) Pulse Rate:  [61-79] 68 (10/05 0604) Resp:  [18-19] 19 (10/05 0604) BP: (157-179)/(71-86) 157/71 mmHg (10/05 0604) SpO2:  [79 %-97 %] 97 % (10/05 0604) Last BM Date: 05/08/15  Intake/Output from previous day: 10/04 0701 - 10/05 0700 In: 1620 [P.O.:1620] Out: 480 [Drains:480] Intake/Output this shift:    Resp: clear to auscultation bilaterally Chest wall: skin flaps look healthy Cardio: regular rate and rhythm GI: soft, non-tender; bowel sounds normal; no masses,  no organomegaly  Lab Results:   Recent Labs  05/10/15 0507 05/11/15 0525  WBC 15.9* 10.3  HGB 12.2 12.4  HCT 37.2 38.5  PLT 215 205   BMET  Recent Labs  05/10/15 0507 05/11/15 0525  NA 139 137  K 3.6 3.3*  CL 108 101  CO2 27 26  GLUCOSE 126* 88  BUN 14 10  CREATININE 0.91 0.74  CALCIUM 8.7* 8.4*   PT/INR No results for input(s): LABPROT, INR in the last 72 hours. ABG No results for input(s): PHART, HCO3 in the last 72 hours.  Invalid input(s): PCO2, PO2  Studies/Results: No results found.  Anti-infectives: Anti-infectives    Start     Dose/Rate Route Frequency Ordered Stop   05/09/15 0830  ceFAZolin (ANCEF) 3 g in dextrose 5 % 50 mL IVPB     3 g 160 mL/hr over 30 Minutes Intravenous To ShortStay Surgical 05/08/15 1213 05/09/15 0943      Assessment/Plan: s/p Procedure(s): RIGHT MASTECTOMY WITH RIGHT AXILLARY SENTINEL LYMPH NODE BIOPSY (Right) LEFT MODIFIED RADICAL MASTETCTOMY (Left) Advance diet  Will add robaxin today to see if this helps with pain. Next step may be to add neurontin Continue drains     TOTH III,PAUL S 05/11/2015

## 2015-05-12 ENCOUNTER — Telehealth: Payer: Self-pay | Admitting: *Deleted

## 2015-05-12 ENCOUNTER — Encounter: Payer: Self-pay | Admitting: *Deleted

## 2015-05-12 DIAGNOSIS — C50412 Malignant neoplasm of upper-outer quadrant of left female breast: Secondary | ICD-10-CM | POA: Diagnosis not present

## 2015-05-12 MED ORDER — METHOCARBAMOL 750 MG PO TABS: 750.0000 mg | ORAL_TABLET | Freq: Four times a day (QID) | ORAL | Status: DC | PRN

## 2015-05-12 MED ORDER — GABAPENTIN 100 MG PO CAPS
100.0000 mg | ORAL_CAPSULE | Freq: Three times a day (TID) | ORAL | Status: DC
  Administered 2015-05-12 – 2015-05-13 (×4): 100 mg via ORAL
  Filled 2015-05-12 (×4): qty 1

## 2015-05-12 MED ORDER — GABAPENTIN 300 MG PO CAPS: 300.0000 mg | ORAL_CAPSULE | Freq: Three times a day (TID) | ORAL | Status: DC

## 2015-05-12 MED ORDER — OXYCODONE-ACETAMINOPHEN 5-325 MG PO TABS: 1.0000 | ORAL_TABLET | ORAL | Status: DC | PRN

## 2015-05-12 NOTE — Progress Notes (Signed)
3 Days Post-Op  Subjective: Still complaining of pain and needing IV meds. Robaxin has helped  Objective: Vital signs in last 24 hours: Temp:  [98.2 F (36.8 C)-98.4 F (36.9 C)] 98.4 F (36.9 C) (10/06 0627) Pulse Rate:  [65-72] 71 (10/06 0627) Resp:  [18] 18 (10/06 0627) BP: (158-178)/(78-103) 168/103 mmHg (10/06 0627) SpO2:  [98 %-100 %] 98 % (10/06 0627) Last BM Date: 05/08/15  Intake/Output from previous day: 10/05 0701 - 10/06 0700 In: 840 [P.O.:840] Out: 445 [Urine:125; Drains:320] Intake/Output this shift:    Resp: clear to auscultation bilaterally Chest wall: skin flaps look good Cardio: regular rate and rhythm GI: soft, non-tender; bowel sounds normal; no masses,  no organomegaly  Lab Results:   Recent Labs  05/10/15 0507 05/11/15 0525  WBC 15.9* 10.3  HGB 12.2 12.4  HCT 37.2 38.5  PLT 215 205   BMET  Recent Labs  05/10/15 0507 05/11/15 0525  NA 139 137  K 3.6 3.3*  CL 108 101  CO2 27 26  GLUCOSE 126* 88  BUN 14 10  CREATININE 0.91 0.74  CALCIUM 8.7* 8.4*   PT/INR No results for input(s): LABPROT, INR in the last 72 hours. ABG No results for input(s): PHART, HCO3 in the last 72 hours.  Invalid input(s): PCO2, PO2  Studies/Results: No results found.  Anti-infectives: Anti-infectives    Start     Dose/Rate Route Frequency Ordered Stop   05/09/15 0830  ceFAZolin (ANCEF) 3 g in dextrose 5 % 50 mL IVPB     3 g 160 mL/hr over 30 Minutes Intravenous To ShortStay Surgical 05/08/15 1213 05/09/15 0943      Assessment/Plan: s/p Procedure(s): RIGHT MASTECTOMY WITH RIGHT AXILLARY SENTINEL LYMPH NODE BIOPSY (Right) LEFT MODIFIED RADICAL MASTETCTOMY (Left) Advance diet  Add neurontin today Hopefully ready for discharge tomorrow     TOTH III,PAUL S 05/12/2015

## 2015-05-12 NOTE — Progress Notes (Signed)
Ordered mammoprint per Dr. Jana Hakim on left side only with 4 positive nodes.  Faxed requisition to [facility] and informed pathology.

## 2015-05-12 NOTE — Progress Notes (Signed)
Dr. Excell Seltzer notified that pt has active discharge order, yet believes she is not to be discharged until tomorrow. Dr. Ethlyn Gallery note supports 10/7 discharge. Left message for Dr. Excell Seltzer to return call to clarify.

## 2015-05-12 NOTE — Telephone Encounter (Signed)
-----   Message from Mauro Kaufmann, RN sent at 05/10/2015 10:37 AM EDT ----- Regarding: Care Plan Hello All,  Margaret [name] was seen in clinic on 9/21. She is scheduled for the following.  Surgery - 10/3 PET scan - 10/18 F/u with Dr. Jana Hakim - 10/21  We will send an oncotype on the right and a mammaprint on the left. Please let me know if you have questions. Thanks, [employer]

## 2015-05-13 ENCOUNTER — Telehealth: Payer: Self-pay | Admitting: Oncology

## 2015-05-13 DIAGNOSIS — C50412 Malignant neoplasm of upper-outer quadrant of left female breast: Secondary | ICD-10-CM | POA: Diagnosis not present

## 2015-05-13 MED ORDER — OMEPRAZOLE 20 MG PO CPDR: 20.0000 mg | DELAYED_RELEASE_CAPSULE | Freq: Every day | ORAL | Status: DC

## 2015-05-13 NOTE — Discharge Summary (Signed)
Physician Discharge Summary  Patient ID: NEIL ERRICKSON MRN: [id number] DOB/AGE: Jun 24, 1961 54 y.o.  Admit date: 05/09/2015 Discharge date: 05/13/2015  Admission Diagnoses:  Discharge Diagnoses:  Active Problems:   Bilateral breast cancer [facility])   Discharged Condition: good  Hospital Course: 54 yo female came to hospital for bilateral mastectomy. She did well, having some pain issues but eventually controlled on oxycodone and tylenol.  Consults: None  Significant Diagnostic Studies: labs: HGB 14  Treatments: surgery: b/l mastectomy  Discharge Exam: Blood pressure 160/90, pulse 84, temperature 98.6 F (37 C), temperature source Oral, resp. rate 20, height 5\' 9"  (1.753 m), weight 129.275 kg (285 lb), SpO2 98 %. General appearance: alert and cooperative Head: Normocephalic, without obvious abnormality, atraumatic Chest wall: incisions c/d/i, drains with serous drainage Cardio: regular rate and rhythm, S1, S2 normal, no murmur, click, rub or gallop GI: soft, non-tender; bowel sounds normal; no masses,  no organomegaly  Disposition: 01-Home or Self Care  Discharge Instructions    Call MD for:  difficulty breathing, headache or visual disturbances    Complete by:  As directed      Call MD for:  extreme fatigue    Complete by:  As directed      Call MD for:  hives    Complete by:  As directed      Call MD for:  persistant dizziness or light-headedness    Complete by:  As directed      Call MD for:  persistant nausea and vomiting    Complete by:  As directed      Call MD for:  redness, tenderness, or signs of infection (pain, swelling, redness, odor or green/yellow discharge around incision site)    Complete by:  As directed      Call MD for:  severe uncontrolled pain    Complete by:  As directed      Call MD for:  temperature >100.4    Complete by:  As directed      Diet - low sodium heart healthy    Complete by:  As directed      Diet - low sodium heart healthy     Complete by:  As directed      Discharge instructions    Complete by:  As directed   Sponge bathe while drains are in. No overhead activity. Empty drains, record output, recharge bulb twice a day     Driving Restrictions    Complete by:  As directed   No driving while on narcotic pain meds     Increase activity slowly    Complete by:  As directed      Increase activity slowly    Complete by:  As directed             Medication List    TAKE these medications        acetaminophen 500 MG tablet  Commonly known as:  TYLENOL  Take 1,000 mg by mouth every 6 (six) hours as needed.     gabapentin 300 MG capsule  Commonly known as:  NEURONTIN  Take 1 capsule (300 mg total) by mouth 3 (three) times daily. To control nerve pain     ibuprofen 200 MG tablet  Commonly known as:  ADVIL,MOTRIN  Take 800-1,000 mg by mouth 3 (three) times daily as needed for headache.     losartan 50 MG tablet  Commonly known as:  COZAAR  Take 50 mg by mouth daily.  methocarbamol 750 MG tablet  Commonly known as:  ROBAXIN  Take 1 tablet (750 mg total) by mouth 4 (four) times daily as needed (use for muscle cramps/pain).     omeprazole 20 MG capsule  Commonly known as:  PRILOSEC  Take 1 capsule (20 mg total) by mouth daily.     oxyCODONE-acetaminophen 5-325 MG tablet  Commonly known as:  ROXICET  Take 1-2 tablets by mouth every 4 (four) hours as needed.     TAGAMET HB 200 MG tablet  Generic drug:  cimetidine  Take 200 mg by mouth 2 (two) times daily as needed.           Follow-up Information    Follow up with Merrie Roof, MD In 1 week.   Specialty:  General Surgery   Contact information:   [place] [address] [phone]       Signed: Arta Bruce Kinsinger 05/13/2015, 7:38 AM

## 2015-05-13 NOTE — Progress Notes (Signed)
Margaret [name] to be D/[name]'d Home per MD order.  Discussed with the patient and all questions fully answered.  An After Visit Summary was printed and given to the patient. Patient received prescription.  D/[name] education completed with patient/family including follow up instructions, medication list, d/[name] activities limitations if indicated, with other d/[name] instructions as indicated by MD - patient able to verbalize understanding, all questions fully answered.   Patient waiting for ride that is to come around noon then will discharge home.    [name], Margaret [name] 05/13/2015 10:57 AM

## 2015-05-13 NOTE — Telephone Encounter (Signed)
Appointments had been made and patient will get a new schedule at hosp d/c,[facility] appt 05/24/15 which will be mailed to pateint

## 2015-05-24 ENCOUNTER — Encounter (HOSPITAL_COMMUNITY)
Admission: RE | Admit: 2015-05-24 | Discharge: 2015-05-24 | Disposition: A | Discharge status: Home / Self Care | Payer: No Typology Code available for payment source | Source: Ambulatory Visit | Attending: Oncology | Admitting: Oncology

## 2015-05-24 DIAGNOSIS — C50412 Malignant neoplasm of upper-outer quadrant of left female breast: Secondary | ICD-10-CM | POA: Diagnosis not present

## 2015-05-24 LAB — GLUCOSE, CAPILLARY: Glucose-Capillary: 95 mg/dL (ref 65–99)

## 2015-05-24 MED ORDER — FLUDEOXYGLUCOSE F - 18 (FDG) INJECTION
14.0200 | Freq: Once | INTRAVENOUS | Status: DC | PRN
  Administered 2015-05-24: 14.02 via INTRAVENOUS
  Filled 2015-05-24: qty 14.02

## 2015-05-25 ENCOUNTER — Telehealth: Payer: Self-pay | Admitting: *Deleted

## 2015-05-25 NOTE — Telephone Encounter (Signed)
Received order for oncotype per Dr. Jana Hakim. Requisition sent to pathology. Received by Alyse Low.

## 2015-05-27 ENCOUNTER — Telehealth: Payer: Self-pay | Admitting: Oncology

## 2015-05-27 ENCOUNTER — Encounter (HOSPITAL_COMMUNITY): Payer: Self-pay

## 2015-05-27 ENCOUNTER — Ambulatory Visit: Payer: No Typology Code available for payment source | Admitting: Oncology

## 2015-05-27 NOTE — Telephone Encounter (Signed)
Called patient and she is aware of her appointments and rad onc will call her   Margaret [name]

## 2015-05-27 NOTE — Progress Notes (Signed)
Margaret [name] was scheduled for 1 hour to see me today so we could go over her situation in detail, but she tells me no one called her with the appointment. She will not be able to make it today.  We went ahead and reviewed her PET scan, which shows no evidence of metastatic disease. We also reviewed her Mammaprint results , which show the node-positive cancer to be low risk. This means Mammaprint predicts no significant benefit from chemotherapy.  We have an Oncotype pending on the contralateral cancer, which was node negative.   at this point we do not have a plan for chemotherapy for [name] and she should proceed directly to radiation. She is agreeable with this plan. I am going to requests an appointment for her with Dr. Sondra Come to discuss this further. She will return to see me in approximately a month and a half to discuss anti-estrogens

## 2015-06-01 ENCOUNTER — Encounter (HOSPITAL_COMMUNITY): Payer: Self-pay

## 2015-06-01 NOTE — Progress Notes (Signed)
Location of Breast Cancer: Breast cancer of upper-outer quadrant of left female breast and right breast  Histology per Pathology Report:   05/09/15  Diagnosis 1. Lymph node, sentinel, biopsy, right axillary - THERE IS NO EVIDENCE OF CARCINOMA IN 1 OF 1 LYMPH NODE (0/1). 2. Lymph node, sentinel, biopsy, right axillary - THERE IS NO EVIDENCE OF CARCINOMA IN 1 OF 1 LYMPH NODE (0/1). 3. 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 - DUCTAL CARCINOMA IN SITU, HIGH GRADE. - LOBULAR NEOPLASIA (LOBULAR CARCINOMA IN SITU). - LYMPHOVASCULAR INVASION IS IDENTIFIED. - METASTATIC CARCINOMA IN 4 OF 14 LYMPH NODES (4/14), WITH EXTRACAPSULAR EXTENSION. - THE SURGICAL RESECTION MARGINS ARE NEGATIVE FOR DUCTAL CARCINOMA. - SEE ONCOLOGY TABLE BELOW. 4. Breast, simple mastectomy, right - INVASIVE DUCTAL CARCINOMA, GRADE 1/3, SPANNING 2.6 CM. - DUCTAL CARCINOMA IN SITU, INTERMEDIATE GRADE. - LOBULAR NEOPLASIA (LOBULAR CARCINOMA IN SITU). - PERINEURAL INVASION IS IDENTIFIED. - SEE ONCOLOGY TABLE BELOW.  04/19/15 Diagnosis 1. Lymph node, needle/core biopsy - METASTATIC CARCINOMA. 2. Breast, left, needle core biopsy, 2:00 o'clock - INVASIVE DUCTAL CARCINOMA. 3 of 5 FINAL for ANESHIA, JACQUET [phone]) Diagnosis(continued) - SEE DESCRIPTION. 3. Breast, left, needle core biopsy, 7:00 o'clock - INVASIVE DUCTAL CARCINOMA. - DUCTAL CARCINOMA IN SITU.  Receptor Status: ER(100%+), PR (100%+), Her2-neu (negative)  Did patient present with symptoms (if so, please note symptoms) or was this found on screening mammography?:The patient presented with a palpable left breast mass.   Past/Anticipated interventions by surgeon, if any: 05/09/15 - Procedure: RIGHT MASTECTOMY WITH RIGHT AXILLARY SENTINEL LYMPH NODE BIOPSY;  Surgeon: Autumn Messing III, MD;  Location: [facility];  Service: General;  Laterality: Right; and Procedure: LEFT MODIFIED RADICAL MASTETCTOMY;   Surgeon: Autumn Messing III, MD;  Location: [facility];  Service: General;  Laterality: Left;  Past/Anticipated interventions by medical oncology, if any: Per Dr. Jana Hakim, "at this point we do not have a plan for chemotherapy for Brittin and she should proceed directly to radiation."  Lymphedema issues, if any:  Left arm has slight swelling.  Pain issues, if any:  Has tenderness in her chest - worse at night.  Currently taking 2 tablets of oxycodone/acetaminophen q 6 hours.  OB Gyn history: Patient has had a hysterectomy.menarche age 54, first live birth age 64. She is GX P4. She underwent a total abdominal hysterectomy with bilateral salpingo-oophorectomy at age 52. She did not take hormone replacement. She never used oral contraceptives.    SAFETY ISSUES:  Prior radiation? no  Pacemaker/ICD? no  Possible current pregnancy?no  Is the patient on methotrexate? no  Current Complaints / other details:  PET scan was done 05/24/15.  Patient is here with her daughter and cousin.  She reports having a drain removed this morning.  BP 165/97 mmHg  Pulse 92  Temp(Src) 98.2 F (36.8 C) (Oral)  Resp 18  Ht _0  (1.753 m)  Wt 207 lb (93.895 kg)  BMI 30.55 kg/m2

## 2015-06-02 ENCOUNTER — Ambulatory Visit
Admission: RE | Admit: 2015-06-02 | Discharge: 2015-06-02 | Disposition: A | Discharge status: Home / Self Care | Payer: No Typology Code available for payment source | Source: Ambulatory Visit | Attending: Radiation Oncology | Admitting: Radiation Oncology

## 2015-06-02 ENCOUNTER — Encounter: Payer: Self-pay | Admitting: *Deleted

## 2015-06-02 ENCOUNTER — Encounter: Payer: Self-pay | Admitting: Radiation Oncology

## 2015-06-02 VITALS — BP 165/97 | HR 92 | Temp 98.2°F | Resp 18 | Ht 69.0 in | Wt 287.0 lb

## 2015-06-02 DIAGNOSIS — Z9011 Acquired absence of right breast and nipple: Secondary | ICD-10-CM | POA: Insufficient documentation

## 2015-06-02 DIAGNOSIS — Z170 Estrogen receptor positive status [ER+]: Secondary | ICD-10-CM | POA: Diagnosis not present

## 2015-06-02 DIAGNOSIS — Z510 Encounter for antineoplastic radiation therapy: Secondary | ICD-10-CM | POA: Diagnosis not present

## 2015-06-02 DIAGNOSIS — C50211 Malignant neoplasm of upper-inner quadrant of right female breast: Secondary | ICD-10-CM

## 2015-06-02 DIAGNOSIS — C50412 Malignant neoplasm of upper-outer quadrant of left female breast: Secondary | ICD-10-CM

## 2015-06-02 HISTORY — DX: Malignant neoplasm of unspecified site of left female breast: C50.912

## 2015-06-02 HISTORY — DX: Malignant neoplasm of unspecified site of right female breast: C50.911

## 2015-06-02 NOTE — Progress Notes (Signed)
Radiation Oncology         [phone] ________________________________  Name: Margaret [name] MRN: [id number]  Date: 06/02/2015  DOB: [birth year]  Re-Evaluation Note   CC: No PCP Per Patient  Jovita Kussmaul, MD    ICD-9-CM ICD-10-CM   1. Breast cancer of upper-inner quadrant of right female breast ([facility]) 174.2 C50.211 Ambulatory referral to Social Work  2. Breast cancer of upper-outer quadrant of left female breast ([facility]) 174.4 C50.412 Ambulatory referral to Social Work    Diagnosis:  Clincal Stage IIIA (T3, N1, M0) left breast, invasive ductal, Clinical Stage IA cancer of the right female breast  Interval Since Surgery: Mastectomy completed on 05/09/2015  Narrative:  The patient returns today for a re-evaluation follow-up appointment. Her case was reviewed via Dr. Jerald Kief as part of the multidisciplinary breast clinic. The patient presented with a palpable left breast mass. She underwent mammography which revealed a retracted left breast skin thickening and a lesion was noted on the right breast. It is noted that the mammographer noticed peau d'orange changes, as well. Two suspicious areas noted within the left breast. One in the 4 o'clock position and one in the 9 o'clock position, those areas were approximately 4 cm apart. She had a biopsy of two areas within the left breast, an area in the right breast, and suspicious lymph node in the left axilla. All those biopsies showed malignancy. Right breast showed a Grade I invasive ductal breast cancer which was ER+, PR+, HER-2 (-), with Ki-67 10%. Left breast both biopsies showed invasive ductal carcinoma, low grade ER+,PR+, with Ki-67 5%. The social worker spoke with the patient today due to her current housing and employment conflicts. The social worker dropped off paperwork later in the appointment for the patient to take home.  Patient was taken to the operating room on October 3 at which time she underwent a right mastectomy with right  axillary sentinel node procedure. Patient also underwent a left modified radical mastectomy.  Pathology from the surgery is documented below. Given the significant disease along the left side she is high risk for local regional recurrence and radiation therapy has been consulted for consideration for postmastectomy irradiation.   Patient continues to have one of her JP drains in place but will likely have this removed next week with her follow-up with Dr. Marlou Starks.  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  Meds: Current Outpatient Prescriptions  Medication Sig Dispense Refill  . gabapentin (NEURONTIN) 300 MG capsule Take 1 capsule (300 mg total) by mouth 3 (three) times daily. To control nerve pain 50 capsule 2  . losartan (COZAAR) 50 MG tablet Take 50 mg by mouth daily.    . methocarbamol (ROBAXIN) 750 MG tablet Take 1 tablet (750 mg total) by mouth 4 (four) times daily as needed (use for muscle cramps/pain). 30 tablet 2  . omeprazole (PRILOSEC) 20 MG capsule Take 1 capsule (20 mg total) by mouth daily. 60 capsule 1  . oxyCODONE-acetaminophen (ROXICET) 5-325 MG tablet Take 1-2 tablets by mouth every 4 (four) hours as needed. 50 tablet 0  . acetaminophen (TYLENOL) 500 MG tablet Take 1,000 mg by mouth every 6 (six) hours as needed.    . cimetidine (TAGAMET HB) 200 MG tablet Take 200 mg by mouth 2 (two) times daily as needed.    Marland Kitchen ibuprofen (ADVIL,MOTRIN) 200 MG tablet Take 800-1,000 mg by mouth 3 (three) times daily as needed for headache.      No  current facility-administered medications for this encounter.   ROS  "I think the worst is over," the patient stated when asked how she felt about her recovery in regards to her most recent surgery. She was informed via phone call that based off of her most recent tests and scans she is not recommended to complete chemotherapy.  She plans to follow up with Dr. Marlou Starks, MD and Dr. Jana Hakim, MD. She denies symptoms of pain, irritations at the  site of surgery, or current fatigue. The patient projected a healthy mental status and was accompanied by her family for today's appointment. She presented anxiety about whether or not she should attend work during treatments. The concerns causing her anxiety were addressed.                Physical Findings: The patient is in no acute distress. Patient is alert and oriented. There is no significant changes to the status of overall health to be noted at this time.  height is _0  (1.753 m) and weight is 287 lb (130.182 kg). Her oral temperature is 98.2 F (36.8 C). Her blood pressure is 165/97 and her pulse is 92. Her respiration is 18.  Lungs are clear to auscultation bilaterally. Heart has regular rate and rhythm. No palpable cervical, supraclavicular, or axillary adenopathy.  Bilateral mastectomy scars healing well. No sign of infection or drainage. The patient continues to have one JP drain in the left side.  Lab Findings: Lab Results  Component Value Date   WBC 10.3 05/11/2015   HGB 12.4 05/11/2015   HCT 38.5 05/11/2015   MCV 90.2 05/11/2015   PLT 205 05/11/2015   3. BREAST, INVASIVE TUMOR, WITH LYMPH NODES PRESENT (LEFT BREAST) Specimen, including laterality and lymph node sampling (sentinel, non-sentinel): Left breast. Procedure: Radical mastectomy. Histologic type: Ductal. Grade: 2. Tubule formation: 2. Nuclear pleomorphism: 3. Mitotic: 2. Tumor size (gross measurement): 15.0 cm. Margins: Greater than 0.2 cm to all margins. Lymphovascular invasion: Present. Ductal carcinoma in situ: Present. Grade: High grade. Extensive intraductal component: Not identified. Lobular neoplasia: Present, lobular carcinoma in situ. Tumor focality: Unifocal. Treatment effect: N/A. 2 of 5 FINAL for LEXEY, FLETES B ([id number]) Microscopic Comment(continued) Extent of tumor: Confined to breast parenchyma. Lymph nodes: Examined: 14 total. Lymph nodes with metastasis: 4. Isolated tumor cells  (< 0.2 mm): 0. Micrometastasis: (> 0.2 mm and < 2.0 mm): 0. Macrometastasis: (> 2.0 mm): 4. Extracapsular extension: Present. Breast prognostic profile: [phone]. Estrogen receptor: 90%, [name] staining intensity. Progesterone receptor: 90%, [name] staining intensity. Her 2 neu: No amplification was detected. The ratio was 1.13. Her 2 neu by FISH will be repeated on the current case (block 3A), and the results reported separately. Ki-67: 10%. Non-neoplastic breast: No significant findings. TNM: pT3, pN2a   Radiographic Findings: Nm Pet Image Initial (pi) Skull Base To Thigh  05/24/2015  CLINICAL DATA:  Initial treatment strategy for breast cancer. EXAM: NUCLEAR MEDICINE PET SKULL BASE TO THIGH TECHNIQUE: 14.0 mCi F-18 FDG was injected intravenously. Full-ring PET imaging was performed from the skull base to thigh after the radiotracer. CT data was obtained and used for attenuation correction and anatomic localization. FASTING BLOOD GLUCOSE:  Value: 95 mg/dl COMPARISON:  None. FINDINGS: NECK There is a focus of intense radiotracer uptake within the for of the right maxillary sinus. This measures approximately 2.6 cm and has an SUV max equal to 26.8. No hypermetabolic lymph nodes identified within the soft tissues of the neck. CHEST Postop changes from bilateral mastectomy noted.  Left axillary lymph node dissection has been performed. Surgical drain is identified within the left axilla. Nonspecific increased uptake throughout bilateral chest wall and left axilla is likely postoperative. Within the inferior portion of the left axilla there are several prominent lymph nodes. The largest measures 1.2 cm ABDOMEN/PELVIS No abnormal hypermetabolic activity within the liver, pancreas, adrenal glands, or spleen. No hypermetabolic lymph nodes in the abdomen or pelvis. SKELETON Diffuse heterogeneous uptake is identified throughout the axial and proximal appendicular skeleton. No focal areas of asymmetric  increased uptake identified. On the corresponding CT images there are no aggressive lytic or sclerotic bone lesions noted. IMPRESSION: 1. No specific findings identified to suggest hypermetabolic metastasis status post bilateral mastectomy and left axillary nodal dissection. 2. Mild diffuse increased radiotracer uptake identified throughout the visualized axial and appendicular skeleton. No corresponding CT abnormalities noted to suggest bone metastases. 3. Increased uptake within the floor of the right maxillary sinus is likely inflammatory or infectious. Electronically Signed   By: Kerby Moors M.D.   On: 05/24/2015 14:26   Nm Sentinel Node Inj-no Rpt (breast)  05/09/2015  CLINICAL DATA: bilateral breast cancer but mapping on right only Sulfur colloid was injected intradermally by the nuclear medicine technologist for breast cancer sentinel node localization.    Impression: Margaret [name] is a 54 year old female presenting to clinic in regards to her Clincal Stage IIIA (T3, N1, M0) left breast, invasive ductal, Clinical Stage IA cancer of the right female breast. She is managing reported symptoms approprietly and recovering from the effects of her most recent surgery, completed on May 09, 2015. She understands the results of her most recent PET scan completed on 05/24/2015. She understands the necessity of waiting about five weeks until after surgery to allow proper healing before beginning radiation therapy treatments.   The patient understands the benefits, risks, and purpose of radiation therapy were reviewed in detail. She understands healthy methods of management in regards to possible symptoms that may occur based off of her radiation therapy treatment plan. Her family expressed confusion between the difference of radiation therapy treatments and "the radiation pill". These questions were addressed. The patient and her family additionally understand the difference between chemotherapy and  radiation therapy. The patient understands that she can access her appointments and medical records via [facility]. The patient understands that 33 treatments can extend over the time frame of about 6 weeks. She additionally understands that the tattoos placed during the simulation treatment planning session will be permanent.  She is at risk for local regional recurrence along the left side and would likely benefit from postmastectomy irradiation. Patient is at low risk for recurrence along the right side and radiation therapy is not recommended along this side.   Plan: Healthy methods of management in regards to reported symptoms were reviewed in detail. The patient was educated on common symptoms to expect during radiation treatment and healthy methods of management to address these symptoms if they are to occur. The patient was educated on the traditional process she will experience during radiation therapy treatments, including the purpose and definition of a CT scan, simulation planning session, and the appropriate time frame to set aside daily for treatments. She is aware of her CT scan and simulation planning session to take place on November 9th of 2016 at 9:00am. In regards to the specific area in which the patient is being treated, symptoms of dry skin "like a sunburn" were explained and she understands that she can treat this with  provided cream.   She is advised to attend her follow-up appointment with Dr. Jana Hakim, MD on 07/25/2015 and to follow-up with Dr. Marlou Starks, MD as scheduled. All vocalized questions and concerns have been addressed. If the patient develops any further questions or concerns in regards to her treatment and recovery, she has been encouraged to contact Dr. Sondra Come, MD.  If she inquires about additional paperwork to provide to her work place so that she may attend treatments, this will be provided. Consent documentation was reviewed and signed.  This document serves as a record of  services personally performed by Gery Pray, MD. It was created on his behalf by Lenn Cal, a trained medical scribe. The creation of this record is based on the scribe's personal observations and the provider's statements to them. This document has been checked and approved by the attending provider.    -----------------------------------  Blair Promise, PhD, MD

## 2015-06-02 NOTE — Progress Notes (Signed)
Please see the Nurse Progress Note in the MD Initial Consult Encounter for this patient. 

## 2015-06-02 NOTE — Progress Notes (Signed)
[facility] Psychosocial Distress Screening Clinical Social Work  Clinical Social Work was referred by distress screening protocol.  The patient scored a 10 on the Psychosocial Distress Thermometer which indicates severe distress. Clinical Social Worker met with pt, her daughter and cousin in exam room prior to her radiation oncology appointment to assess for distress and other psychosocial needs. Pt reports she has been living with a female friend and this has become more stressful for her after her surgery. She is requesting assistance with housing options. CSW to provide list of low income housing options for pt. Pt also having financial hardship due to not working after surgery. Pt met with Jodelle Green, financial counselor and CSW reviewed additional options for assistance such as [facility], [place] in [place]. CSW provided pt with applications for these assistance programs. She also plans to apply for the [employer]. Pt plans to begin application process and reach out to CSW to set an appointment when she is ready to complete applications.   ONCBCN DISTRESS SCREENING 06/02/2015  Screening Type Initial Screening  Distress experienced in past week (1-10) 10  Practical problem type Housing  Family Problem type   Referral to clinical social work   Referral to financial advocate   Referral to support programs   Other     Clinical Social Worker follow up needed: Yes.    If yes, follow up plan: [name], [place]  [facility] Phone: [phone] Fax: [phone]

## 2015-06-15 ENCOUNTER — Ambulatory Visit
Admission: RE | Admit: 2015-06-15 | Discharge: 2015-06-15 | Disposition: A | Discharge status: Home / Self Care | Payer: No Typology Code available for payment source | Source: Ambulatory Visit | Attending: Radiation Oncology | Admitting: Radiation Oncology

## 2015-06-15 DIAGNOSIS — C50412 Malignant neoplasm of upper-outer quadrant of left female breast: Secondary | ICD-10-CM

## 2015-06-16 ENCOUNTER — Encounter: Payer: Self-pay | Admitting: *Deleted

## 2015-06-16 DIAGNOSIS — Z510 Encounter for antineoplastic radiation therapy: Secondary | ICD-10-CM | POA: Diagnosis not present

## 2015-06-16 NOTE — Progress Notes (Signed)
Margaret [name]  Clinical Social [name] met with pt to assist with referral/application to a breast cancer organization that a friend of hers located. CSW not sure pt will meet their application criteria at this time. CSW assisted with needed paperwork and discussed other organizations for pt to apply for additional assistance. Pt met with Margaret [name], [facility] today and plans to apply for grant. Pt provided with contact information for Cancer [facility] and pt plans to contact. Pt will return on 06/27/15 at 3pm for additonal application assistance.  Clinical Social [name]  Margaret [name], [place] Worker [place]  [place] Phone: [phone] Fax: [phone]

## 2015-06-20 NOTE — Progress Notes (Signed)
  Radiation Oncology         [phone] ________________________________  Name: Margaret [name] MRN: [id number]  Date: 06/15/2015  DOB: 07/27/1961  Optical Surface Tracking Plan:  Since intensity modulated radiotherapy (IMRT) and 3D conformal radiation treatment methods are predicated on accurate and precise positioning for treatment, intrafraction motion monitoring is medically necessary to ensure accurate and safe treatment delivery.  The ability to quantify intrafraction motion without excessive ionizing radiation dose can only be performed with optical surface tracking. Accordingly, surface imaging offers the opportunity to obtain 3D measurements of patient position throughout IMRT and 3D treatments without excessive radiation exposure.  I am ordering optical surface tracking for this patient's upcoming course of radiotherapy. ________________________________  Gery Pray, MD 06/20/2015 4:45 PM    Reference:   Particia Jasper, et al. Surface imaging-based analysis of intrafraction motion for breast radiotherapy patients.Journal of [place], n. 6, nov. 2014. ISSN [id number].   Available at: <http://www.jacmp.org/index.php/jacmp/article/view/4957>.

## 2015-06-20 NOTE — Progress Notes (Addendum)
  Radiation Oncology         [phone] ________________________________  Name: Margaret [name] MRN: [id number]  Date: 06/16/15  DOB: 09-10-1960  SIMULATION AND TREATMENT PLANNING NOTE    ICD-9-CM ICD-10-CM   1. Breast cancer of upper-outer quadrant of left female breast ([facility]) 174.4 C50.412     DIAGNOSIS:  Clincal Stage IIIA (T3, N1, M0) left breast, invasive ductal  NARRATIVE:  The patient was brought to the [facility].  Identity was confirmed.  All relevant records and images related to the planned course of therapy were reviewed.  The patient freely provided informed written consent to proceed with treatment after reviewing the details related to the planned course of therapy. The consent form was witnessed and verified by the simulation staff.  Then, the patient was set-up in a stable reproducible  supine position for radiation therapy.  CT images were obtained.  Surface markings were placed.  The CT images were loaded into the planning software.  Then the target and avoidance structures were contoured.  Treatment planning then occurred.  The radiation prescription was entered and confirmed.  Then, I designed and supervised the construction of a total of 4 medically necessary complex treatment devices.  I have requested : 3D Simulation  I have requested a DVH of the following structures: heart,  lungs, spinal cord, .  I have ordered:dose calc.  PLAN:  The patient will receive 45 Gy in 25 fractions directed at the left chest wall, high axilla and supraclavicular region. The patient will then receive boost to the mastectomy scar area of 14.4 gray and a cumulative dose to this area of 59.4 gray.  ________________________________ -----------------------------------  Blair Promise, PhD, MD

## 2015-06-21 DIAGNOSIS — Z510 Encounter for antineoplastic radiation therapy: Secondary | ICD-10-CM | POA: Diagnosis not present

## 2015-06-23 DIAGNOSIS — Z510 Encounter for antineoplastic radiation therapy: Secondary | ICD-10-CM | POA: Diagnosis not present

## 2015-06-27 ENCOUNTER — Inpatient Hospital Stay: Admission: RE | Admit: 2015-06-27 | Payer: Self-pay | Source: Ambulatory Visit

## 2015-06-27 ENCOUNTER — Ambulatory Visit
Admission: RE | Admit: 2015-06-27 | Discharge: 2015-06-27 | Disposition: A | Discharge status: Home / Self Care | Payer: No Typology Code available for payment source | Source: Ambulatory Visit | Attending: Radiation Oncology | Admitting: Radiation Oncology

## 2015-06-27 DIAGNOSIS — Z510 Encounter for antineoplastic radiation therapy: Secondary | ICD-10-CM | POA: Diagnosis not present

## 2015-06-28 ENCOUNTER — Encounter: Payer: Self-pay | Admitting: Radiation Oncology

## 2015-06-28 ENCOUNTER — Ambulatory Visit
Admission: RE | Admit: 2015-06-28 | Discharge: 2015-06-28 | Disposition: A | Discharge status: Home / Self Care | Payer: No Typology Code available for payment source | Source: Ambulatory Visit | Attending: Radiation Oncology | Admitting: Radiation Oncology

## 2015-06-28 VITALS — BP 149/88 | HR 81 | Temp 98.2°F | Resp 16 | Ht 69.0 in | Wt 282.0 lb

## 2015-06-28 DIAGNOSIS — Z510 Encounter for antineoplastic radiation therapy: Secondary | ICD-10-CM | POA: Diagnosis not present

## 2015-06-28 DIAGNOSIS — C50412 Malignant neoplasm of upper-outer quadrant of left female breast: Secondary | ICD-10-CM

## 2015-06-28 MED ORDER — GABAPENTIN 300 MG PO CAPS: 300.0000 mg | ORAL_CAPSULE | Freq: Three times a day (TID) | ORAL | Status: DC

## 2015-06-28 MED ORDER — RADIAPLEXRX EX GEL
Freq: Once | CUTANEOUS | Status: AC
  Administered 2015-06-28: 18:00:00 via TOPICAL

## 2015-06-28 MED ORDER — ALRA NON-METALLIC DEODORANT (RAD-ONC)
1.0000 "application " | Freq: Once | TOPICAL | Status: AC
  Administered 2015-06-28: 1 via TOPICAL

## 2015-06-28 NOTE — Progress Notes (Signed)
Margaret [name] has completed 2 fractions to her left chest wall.  She reports having pain/soreness in her left chest.  She reports she was taking gabapentin in the past and it helped.  She is wondering if she can get a refill.  The skin on her left chest wall has slight hyperpigmentation.   BP 149/88 mmHg  Pulse 81  Temp(Src) 98.2 F (36.8 C) (Oral)  Resp 16  Ht 5\' 9"  (1.753 m)  Wt 282 lb (127.914 kg)  BMI 41.63 kg/m2

## 2015-06-28 NOTE — Progress Notes (Signed)
Pt here for patient teaching.  Pt given Radiation and You booklet, skin care instructions, Alra deodorant and Radiaplex gel. Reviewed areas of pertinence such as fatigue and skin changes . Pt able to give teach back of to pat skin and use unscented/gentle soap,apply Radiaplex bid and avoid applying anything to skin within 4 hours of treatment. Pt demonstrated understanding and verbalizes understanding of information given and will contact nursing with any questions or concerns.        

## 2015-06-28 NOTE — Progress Notes (Signed)
  Radiation Oncology         [phone] ________________________________  Name: Margaret [name] MRN: [id number]  Date: 06/28/2015  DOB: 1961-04-01  Weekly Radiation Therapy Management  Current Dose: 3.6 Gy     Planned Dose:  59.4 Gy  Narrative . . . . . . . . The patient presents for routine under treatment assessment.                                   The patient is without complaint for some soreness along the chest wall area. She is previously been on gabapentin for this issue which has helped in the past. She will be given a refill for this medication.                                 Set-up films were reviewed.                                 The chart was checked. Physical Findings. . .  height is 5\' 9"  (1.753 m) and weight is 282 lb (127.914 kg). Her oral temperature is 98.2 F (36.8 C). Her blood pressure is 149/88 and her pulse is 81. Her respiration is 16. . The lungs are clear. The heart has a regular rhythm and rate. The left chest wall area shows her mastectomy scar to be well-healed. No appreciable radiation reaction at this time. Impression . . . . . . . The patient is tolerating radiation. Plan . . . . . . . . . . . . Continue treatment as planned.  ________________________________   Blair Promise, PhD, MD

## 2015-06-29 ENCOUNTER — Encounter (HOSPITAL_COMMUNITY): Payer: Self-pay

## 2015-06-29 ENCOUNTER — Emergency Department (HOSPITAL_COMMUNITY): Payer: No Typology Code available for payment source

## 2015-06-29 ENCOUNTER — Ambulatory Visit: Payer: No Typology Code available for payment source

## 2015-06-29 ENCOUNTER — Telehealth: Payer: Self-pay | Admitting: Oncology

## 2015-06-29 ENCOUNTER — Ambulatory Visit
Admit: 2015-06-29 | Discharge: 2015-06-29 | Disposition: A | Discharge status: Home / Self Care | Payer: No Typology Code available for payment source | Attending: Radiation Oncology | Admitting: Radiation Oncology

## 2015-06-29 ENCOUNTER — Emergency Department (HOSPITAL_COMMUNITY)
Admission: EM | Admit: 2015-06-29 | Discharge: 2015-06-29 | Disposition: A | Discharge status: Home / Self Care | Payer: No Typology Code available for payment source | Attending: Emergency Medicine | Admitting: Emergency Medicine

## 2015-06-29 DIAGNOSIS — N390 Urinary tract infection, site not specified: Secondary | ICD-10-CM | POA: Diagnosis not present

## 2015-06-29 DIAGNOSIS — A599 Trichomoniasis, unspecified: Secondary | ICD-10-CM | POA: Insufficient documentation

## 2015-06-29 DIAGNOSIS — R112 Nausea with vomiting, unspecified: Secondary | ICD-10-CM | POA: Diagnosis present

## 2015-06-29 DIAGNOSIS — R5383 Other fatigue: Secondary | ICD-10-CM | POA: Diagnosis not present

## 2015-06-29 DIAGNOSIS — K219 Gastro-esophageal reflux disease without esophagitis: Secondary | ICD-10-CM | POA: Diagnosis not present

## 2015-06-29 DIAGNOSIS — M545 Low back pain: Secondary | ICD-10-CM | POA: Diagnosis not present

## 2015-06-29 DIAGNOSIS — Z792 Long term (current) use of antibiotics: Secondary | ICD-10-CM | POA: Diagnosis not present

## 2015-06-29 DIAGNOSIS — F17210 Nicotine dependence, cigarettes, uncomplicated: Secondary | ICD-10-CM | POA: Insufficient documentation

## 2015-06-29 DIAGNOSIS — I10 Essential (primary) hypertension: Secondary | ICD-10-CM | POA: Insufficient documentation

## 2015-06-29 DIAGNOSIS — Z79899 Other long term (current) drug therapy: Secondary | ICD-10-CM | POA: Diagnosis not present

## 2015-06-29 DIAGNOSIS — Z91040 Latex allergy status: Secondary | ICD-10-CM | POA: Diagnosis not present

## 2015-06-29 DIAGNOSIS — R51 Headache: Secondary | ICD-10-CM | POA: Insufficient documentation

## 2015-06-29 DIAGNOSIS — Z853 Personal history of malignant neoplasm of breast: Secondary | ICD-10-CM | POA: Insufficient documentation

## 2015-06-29 DIAGNOSIS — R519 Headache, unspecified: Secondary | ICD-10-CM

## 2015-06-29 LAB — CBC WITH DIFFERENTIAL/PLATELET
Basophils Absolute: 0 10*3/uL (ref 0.0–0.1)
Basophils Relative: 0 %
Eosinophils Absolute: 0.1 10*3/uL (ref 0.0–0.7)
Eosinophils Relative: 1 %
HCT: 42.4 % (ref 36.0–46.0)
Hemoglobin: 13.8 g/dL (ref 12.0–15.0)
Lymphocytes Relative: 16 %
Lymphs Abs: 2.4 10*3/uL (ref 0.7–4.0)
MCH: 29.1 pg (ref 26.0–34.0)
MCHC: 32.5 g/dL (ref 30.0–36.0)
MCV: 89.3 fL (ref 78.0–100.0)
Monocytes Absolute: 0.7 10*3/uL (ref 0.1–1.0)
Monocytes Relative: 5 %
Neutro Abs: 11.3 10*3/uL — ABNORMAL HIGH (ref 1.7–7.7)
Neutrophils Relative %: 78 %
Platelets: 223 10*3/uL (ref 150–400)
RBC: 4.75 MIL/uL (ref 3.87–5.11)
RDW: 14.3 % (ref 11.5–15.5)
WBC: 14.6 10*3/uL — ABNORMAL HIGH (ref 4.0–10.5)

## 2015-06-29 LAB — URINALYSIS, ROUTINE W REFLEX MICROSCOPIC
Bilirubin Urine: NEGATIVE
Glucose, UA: NEGATIVE mg/dL
Ketones, ur: NEGATIVE mg/dL
Nitrite: NEGATIVE
Protein, ur: NEGATIVE mg/dL
Specific Gravity, Urine: 1.019 (ref 1.005–1.030)
pH: 7 (ref 5.0–8.0)

## 2015-06-29 LAB — COMPREHENSIVE METABOLIC PANEL
ALT: 21 U/L (ref 14–54)
AST: 20 U/L (ref 15–41)
Albumin: 3.5 g/dL (ref 3.5–5.0)
Alkaline Phosphatase: 87 U/L (ref 38–126)
Anion gap: 10 (ref 5–15)
BUN: 7 mg/dL (ref 6–20)
CO2: 24 mmol/L (ref 22–32)
Calcium: 9.2 mg/dL (ref 8.9–10.3)
Chloride: 102 mmol/L (ref 101–111)
Creatinine, Ser: 0.84 mg/dL (ref 0.44–1.00)
GFR calc Af Amer: >60 mL/min (ref >60)
GFR calc non Af Amer: >60 mL/min (ref >60)
Glucose, Bld: 107 mg/dL — ABNORMAL HIGH (ref 65–99)
Potassium: 4 mmol/L (ref 3.5–5.1)
Sodium: 136 mmol/L (ref 135–145)
Total Bilirubin: 1 mg/dL (ref 0.3–1.2)
Total Protein: 7.8 g/dL (ref 6.5–8.1)

## 2015-06-29 LAB — URINE MICROSCOPIC-ADD ON

## 2015-06-29 MED ORDER — SODIUM CHLORIDE 0.9 % IV BOLUS (SEPSIS)
1000.0000 mL | Freq: Once | INTRAVENOUS | Status: AC
  Administered 2015-06-29: 1000 mL via INTRAVENOUS

## 2015-06-29 MED ORDER — HYDROMORPHONE HCL 1 MG/ML IJ SOLN
1.0000 mg | Freq: Once | INTRAMUSCULAR | Status: AC
  Administered 2015-06-29: 1 mg via INTRAVENOUS
  Filled 2015-06-29: qty 1

## 2015-06-29 MED ORDER — CEFTRIAXONE SODIUM 1 G IJ SOLR
1.0000 g | Freq: Once | INTRAMUSCULAR | Status: AC
  Administered 2015-06-29: 1 g via INTRAVENOUS
  Filled 2015-06-29: qty 10

## 2015-06-29 MED ORDER — DIPHENHYDRAMINE HCL 50 MG/ML IJ SOLN
25.0000 mg | Freq: Once | INTRAMUSCULAR | Status: AC
  Administered 2015-06-29: 25 mg via INTRAVENOUS
  Filled 2015-06-29: qty 1

## 2015-06-29 MED ORDER — OXYCODONE-ACETAMINOPHEN 5-325 MG PO TABS: 1.0000 | ORAL_TABLET | Freq: Four times a day (QID) | ORAL | Status: DC | PRN

## 2015-06-29 MED ORDER — GI COCKTAIL ~~LOC~~
30.0000 mL | Freq: Once | ORAL | Status: AC
  Administered 2015-06-29: 30 mL via ORAL
  Filled 2015-06-29: qty 30

## 2015-06-29 MED ORDER — METOCLOPRAMIDE HCL 5 MG/ML IJ SOLN
10.0000 mg | Freq: Once | INTRAMUSCULAR | Status: AC
  Administered 2015-06-29: 10 mg via INTRAVENOUS
  Filled 2015-06-29: qty 2

## 2015-06-29 MED ORDER — CEPHALEXIN 500 MG PO CAPS: 500.0000 mg | ORAL_CAPSULE | Freq: Four times a day (QID) | ORAL | Status: DC

## 2015-06-29 MED ORDER — METRONIDAZOLE 500 MG PO TABS
2000.0000 mg | ORAL_TABLET | Freq: Once | ORAL | Status: AC
  Administered 2015-06-29: 2000 mg via ORAL
  Filled 2015-06-29: qty 4

## 2015-06-29 MED ORDER — PROMETHAZINE HCL 25 MG PO TABS: 25.0000 mg | ORAL_TABLET | Freq: Four times a day (QID) | ORAL | Status: DC | PRN

## 2015-06-29 MED ORDER — ACIDOPHILUS PROBIOTIC 10 MG PO TABS: 10.0000 mg | ORAL_TABLET | Freq: Three times a day (TID) | ORAL | Status: DC

## 2015-06-29 MED ORDER — MORPHINE SULFATE (PF) 4 MG/ML IV SOLN
4.0000 mg | Freq: Once | INTRAVENOUS | Status: AC
  Administered 2015-06-29: 4 mg via INTRAVENOUS
  Filled 2015-06-29: qty 1

## 2015-06-29 NOTE — ED Notes (Signed)
Pt verbalized understanding of d/c instructions, prescriptions, and follow-up care. No further questions/concerns, VSS, assisted to lobby in wheelchair.  

## 2015-06-29 NOTE — ED Notes (Signed)
Myself and Kaitlyn, EMT undressed patient, in gown, on monitor, continuous pulse oximetry and blood pressure cuff; patient also gave an urine specimen which is at the bedside

## 2015-06-29 NOTE — Telephone Encounter (Addendum)
Called in per Dr. Sondra Come to [facility] - gabapentin (NEURONTIN) 300 MG capsule -Take 1 capsule (300 mg total) by mouth 3 (three) times daily. To control nerve pain.  Disp. 50 capsules. 2 refills.

## 2015-06-29 NOTE — ED Notes (Signed)
Pt had double mastectomy 05/2015 and began radiation tx yesterday. Pt c/o n/v/generalized malaise since yesterday. Pt tearful.

## 2015-06-29 NOTE — Telephone Encounter (Signed)
Called Margaret [name] to see how she is feeling.  She said she went to the ER with vomiting, headache and back pain.  She was diagnosed with a UTI and was sent home.  She said she had an X ray of her back which did not show anything.  She does not feel well enough to come in for radiation.  Notified Miranda, RT on [place] [address].

## 2015-06-29 NOTE — ED Notes (Signed)
Patient transported to X-ray 

## 2015-06-29 NOTE — ED Provider Notes (Signed)
CSN: [id number]     Arrival date & time 06/29/15  [id number] History   First MD Initiated Contact with Patient 06/29/15 [phone]     Chief Complaint  Patient presents with  . Nausea  . Emesis   Margaret [name] is a 54 y.o. female with a history of hypertension and bilateral breast cancer status post bilateral mastectomy who presents to the emergency department complaining of nausea, vomiting, bilateral low back pain, and a headache since last night. The patient is currently undergoing radiation therapy and reports she had her first 3 treatments in the past week. She reports having last night she began having bilateral low back pain, about headache and nausea. She reports she currently has 10 out of 10 generalized pain. She reports took oxycodone at 4 AM this morning without relief. She denies any abdominal pain, but later admits to some discomfort in her suprapubic area. She reports some "discomfort" with urination. Patient reports she is due to have radiation tx today, but felt like she could not go. She denies fevers, rashes, chest pain, shortness of breath, double vision, numbness, tingling, weakness, neck stiffness, hematemesis, hematochezia, diarrhea, loss of bowel or bladder control, or rashes.   (Consider location/radiation/quality/duration/timing/severity/associated sxs/prior Treatment) HPI  Past Medical History  Diagnosis Date  . Hypertension   . Breast cancer of upper-outer quadrant of left female breast ([place]) 04/21/2015  . Breast cancer ([place])   . GERD (gastroesophageal reflux disease)   . Bilateral breast cancer [facility])    Past Surgical History  Procedure Laterality Date  . Abdominal hysterectomy  1990  . Tubal ligation  1984  . Mastectomy w/ sentinel node biopsy Right   . Mastectomy modified radical Left 05/09/2015  . Mastectomy w/ sentinel node biopsy Right 05/09/2015    Procedure: RIGHT MASTECTOMY WITH RIGHT AXILLARY SENTINEL LYMPH NODE BIOPSY;  Surgeon: Autumn Messing III, MD;  Location: [place];  Service: General;  Laterality: Right;  . Mastectomy modified radical Left 05/09/2015    Procedure: LEFT MODIFIED RADICAL MASTETCTOMY;  Surgeon: Autumn Messing III, MD;  Location: [place];  Service: General;  Laterality: Left;   Family History  Problem Relation Age of Onset  . Hypertension Mother   . Diabetes Mother    Social History  Substance Use Topics  . Smoking status: Current Some Day Smoker -- 0.10 packs/day for 28 years    Types: Cigarettes  . Smokeless tobacco: Never Used     Comment: smokes 1 cigarette every other day  . Alcohol Use: No   OB History    No data available     Review of Systems  Constitutional: Positive for fatigue. Negative for fever and chills.  HENT: Negative for congestion, ear pain, hearing loss, sore throat and trouble swallowing.   Eyes: Negative for pain and visual disturbance.  Respiratory: Negative for cough, shortness of breath and wheezing.   Cardiovascular: Negative for chest pain and palpitations.  Gastrointestinal: Positive for nausea and vomiting. Negative for abdominal pain, diarrhea and blood in stool.  Genitourinary: Positive for dysuria. Negative for urgency, frequency, hematuria, flank pain, difficulty urinating and menstrual problem.  Musculoskeletal: Positive for back pain. Negative for neck pain and neck stiffness.  Skin: Negative for rash.  Neurological: Positive for headaches. Negative for dizziness, syncope, speech difficulty, weakness, light-headedness and numbness.      Allergies  Latex; Peanuts; and Wheat bran  Home Medications   Prior to Admission medications   Medication Sig Start Date End Date Taking? Authorizing Provider  gabapentin (NEURONTIN) 300 MG capsule Take 1 capsule (300 mg total) by mouth 3 (three) times daily. To control nerve pain 06/28/15  Yes Gery Pray, MD  losartan (COZAAR) 50 MG tablet Take 50 mg by mouth daily.   Yes Historical Provider, MD  omeprazole (PRILOSEC) 20 MG capsule Take 1 capsule (20 mg  total) by mouth daily. 05/13/15  Yes Arta Bruce Kinsinger, MD  acetaminophen (TYLENOL) 500 MG tablet Take 1,000 mg by mouth every 6 (six) hours as needed for mild pain.     Historical Provider, MD  cephALEXin (KEFLEX) 500 MG capsule Take 1 capsule (500 mg total) by mouth 4 (four) times daily. 06/29/15   Waynetta Pean, PA-C  cimetidine (TAGAMET HB) 200 MG tablet Take 200 mg by mouth 2 (two) times daily as needed.    Historical Provider, MD  hyaluronate sodium (RADIAPLEXRX) GEL Apply 1 application topically once.    Historical Provider, MD  ibuprofen (ADVIL,MOTRIN) 200 MG tablet Take 800-1,000 mg by mouth 3 (three) times daily as needed for headache.     Historical Provider, MD  Lactobacillus (ACIDOPHILUS PROBIOTIC) 10 MG TABS Take 10 mg by mouth 3 (three) times daily. 06/29/15   Waynetta Pean, PA-C  methocarbamol (ROBAXIN) 750 MG tablet Take 1 tablet (750 mg total) by mouth 4 (four) times daily as needed (use for muscle cramps/pain). Patient not taking: Reported on 06/28/2015 05/12/15   Jovita Kussmaul, MD  non-metallic deodorant Jethro Poling) MISC Apply 1 application topically daily as needed.    Historical Provider, MD  oxyCODONE-acetaminophen (PERCOCET/ROXICET) 5-325 MG tablet Take 1-2 tablets by mouth every 6 (six) hours as needed for severe pain. May take 2 tablets PO q 6 hours for severe pain - Do not take with Tylenol as this tablet already contains tylenol 06/29/15   Waynetta Pean, PA-C  promethazine (PHENERGAN) 25 MG tablet Take 1 tablet (25 mg total) by mouth every 6 (six) hours as needed for nausea or vomiting. 06/29/15   Waynetta Pean, PA-C   BP 175/96 mmHg  Pulse 68  Temp(Src) 98.6 F (37 C) (Oral)  Resp 20  SpO2 95% Physical Exam  Constitutional: She is oriented to person, place, and time. She appears well-developed and well-nourished. No distress.  Nontoxic appearing. Tearful.  HENT:  Head: Normocephalic and atraumatic.  Right Ear: External ear normal.  Left Ear: External ear normal.   Mouth/Throat: Oropharynx is clear and moist. No oropharyngeal exudate.  Eyes: Conjunctivae and EOM are normal. Pupils are equal, round, and reactive to light. Right eye exhibits no discharge. Left eye exhibits no discharge.  Neck: Normal range of motion. Neck supple. No JVD present. No tracheal deviation present.  Cardiovascular: Normal rate, regular rhythm, normal heart sounds and intact distal pulses.  Exam reveals no gallop and no friction rub.   No murmur heard. Pulmonary/Chest: Effort normal and breath sounds normal. No respiratory distress. She has no wheezes. She has no rales.  Lungs are clear to auscultation bilaterally.  Abdominal: Soft. There is no tenderness. There is no rebound and no guarding.  Abdomen is soft and nontender to palpation. No peritoneal signs. No CVA or flank tenderness.  Musculoskeletal: Normal range of motion. She exhibits no edema or tenderness.  No midline neck or back tenderness. No back tenderness to palpation. No back edema, deformity, ecchymosis or rashes. Patient has good strength in her bilateral upper and lower extremities.  Lymphadenopathy:    She has no cervical adenopathy.  Neurological: She is alert and oriented to person, place, and  time. No cranial nerve deficit. Coordination normal.  The patient is alert and oriented 3. Cranial nerves are intact. Sensation is intact in her bilateral upper and lower extremities. Good and equal grip strengths bilaterally. Speech is clear and coherent. The patient is able to ambulate with normal gait.  Skin: Skin is warm and dry. No rash noted. She is not diaphoretic. No erythema. No pallor.  Psychiatric: She has a normal mood and affect. Her behavior is normal.  Nursing note and vitals reviewed.   ED Course  Procedures (including critical care time) Labs Review Labs Reviewed  URINALYSIS, ROUTINE W REFLEX MICROSCOPIC (NOT AT [facility]) - Abnormal; Notable for the following:    APPearance CLOUDY (*)    Hgb urine  dipstick TRACE (*)    Leukocytes, UA MODERATE (*)    All other components within normal limits  COMPREHENSIVE METABOLIC PANEL - Abnormal; Notable for the following:    Glucose, Bld 107 (*)    All other components within normal limits  CBC WITH DIFFERENTIAL/PLATELET - Abnormal; Notable for the following:    WBC 14.6 (*)    Neutro Abs 11.3 (*)    All other components within normal limits  URINE MICROSCOPIC-ADD ON - Abnormal; Notable for the following:    Squamous Epithelial / LPF 6-30 (*)    Bacteria, UA MANY (*)    All other components within normal limits  URINE CULTURE    Imaging Review Dg Lumbar Spine Complete  06/29/2015  CLINICAL DATA:  Acute onset low back pain radiating to both legs beginning last night. Breast cancer. EXAM: LUMBAR SPINE - COMPLETE 4+ VIEW COMPARISON:  None. FINDINGS: There is no evidence of lumbar spine fracture. Alignment is normal. Intervertebral disc spaces are maintained. Mild degenerative vertebral osteophyte formation seen at most lumbar levels. Mild facet DJD seen bilaterally at L5-S1. No focal lytic or sclerotic bone lesions identified. IMPRESSION: No acute findings. Mild degenerative spondylosis, as described above. Electronically Signed   By: Earle Gell M.D.   On: 06/29/2015 11:19   I have personally reviewed and evaluated these images and lab results as part of my medical decision-making.   EKG Interpretation   Date/Time:  Wednesday June 29 2015 09:06:24 EST Ventricular Rate:  79 PR Interval:  191 QRS Duration: 89 QT Interval:  479 QTC Calculation: 549 R Axis:   25 Text Interpretation:  Normal sinus rhythm Non-specific ST-t changes  Confirmed by RAY MD, DANIELLE ([id number]) on 06/29/2015 9:31:04 AM      Filed Vitals:   06/29/15 1200 06/29/15 1306 06/29/15 1425 06/29/15 1447  BP: 161/82 155/88 172/97 175/96  Pulse: 106 103 97 68  Temp:      TempSrc:      Resp: 20 20 16 20   SpO2: 98% 100% 100% 95%     MDM   Meds given in  ED:  Medications  sodium chloride 0.9 % bolus 1,000 mL (0 mLs Intravenous Stopped 06/29/15 1415)  morphine 4 MG/ML injection 4 mg (4 mg Intravenous Given 06/29/15 0925)  metoCLOPramide (REGLAN) injection 10 mg (10 mg Intravenous Given 06/29/15 0924)  diphenhydrAMINE (BENADRYL) injection 25 mg (25 mg Intravenous Given 06/29/15 0925)  morphine 4 MG/ML injection 4 mg (4 mg Intravenous Given 06/29/15 1006)  HYDROmorphone (DILAUDID) injection 1 mg (1 mg Intravenous Given 06/29/15 1305)  cefTRIAXone (ROCEPHIN) 1 g in dextrose 5 % 50 mL IVPB (0 g Intravenous Stopped 06/29/15 1346)  metroNIDAZOLE (FLAGYL) tablet 2,000 mg (2,000 mg Oral Given 06/29/15 1314)  sodium chloride 0.9 %  bolus 1,000 mL (0 mLs Intravenous Stopped 06/29/15 1501)  gi cocktail (Maalox,Lidocaine,Donnatal) (30 mLs Oral Given 06/29/15 1425)    Discharge Medication List as of 06/29/2015  3:02 PM    START taking these medications   Details  cephALEXin (KEFLEX) 500 MG capsule Take 1 capsule (500 mg total) by mouth 4 (four) times daily., Starting 06/29/2015, Until Discontinued, Print    Lactobacillus (ACIDOPHILUS PROBIOTIC) 10 MG TABS Take 10 mg by mouth 3 (three) times daily., Starting 06/29/2015, Until Discontinued, Print    promethazine (PHENERGAN) 25 MG tablet Take 1 tablet (25 mg total) by mouth every 6 (six) hours as needed for nausea or vomiting., Starting 06/29/2015, Until Discontinued, Print        Final diagnoses:  UTI (lower urinary tract infection)  Trichimoniasis  Bilateral low back pain, with sciatica presence unspecified  Bad headache    This is a 54 y.o. female with a history of hypertension and bilateral breast cancer status post bilateral mastectomy who presents to the emergency department complaining of nausea, vomiting, bilateral low back pain, and a headache since last night. The patient is currently undergoing radiation therapy and reports she had her first 3 treatments in the past week. She reports  having last night she began having bilateral low back pain, about headache and nausea. She reports she currently has 10 out of 10 generalized pain. She reports took oxycodone at 4 AM this morning without relief. She denies any abdominal pain, but later admits to some discomfort in her suprapubic area. She reports some "discomfort" with urination. On exam patient is afebrile nontoxic appearing. Her lungs are clear to auscultation bilaterally. She has no CVA or flank tenderness. Her abdomen is soft and nontender to palpation. She has no midline back or neck tenderness. She is able to ambulate with normal gait. She has no focal neurological deficits. Will obtain a lumbar spine x-ray is a patient has cancer to rule out any metastases. Blood work and urine also ordered. At this time I see no need for CT scan as the patient has no focal neurological deficits.  CBC is remarkable for leukocytosis with a white count of 14,000. Otherwise unremarkable. CMP is unremarkable. Urinalysis shows moderate leukocytes with many bacteria and trichomonas present. Will treat for urinary tract infection with a gram of Rocephin and 2 g of Flagyl in the emergency department. We'll discharge with Keflex 500 mg 4 times a day for 10 days to cover for both the UTI and if there is pyelonephritis. Urine sent for culture.  Lumbar spine x-ray is unremarkable.  At reevaluation patient reports feeling much better after fluids and pain control. She reports her pain is now tolerable and she feels ready for discharge.  We'll discharge with prescriptions for Percocet, Keflex, probiotic and Phenergan for nausea. Encourage close follow-up by her oncologist and primary care doctor. I advised strict return precautions. I advised the patient to follow-up with their primary care provider this week. I advised the patient to return to the emergency department with new or worsening symptoms or new concerns. The patient verbalized understanding and agreement  with plan.    This patient was discussed with and evaluated by Dr. Jeanell Sparrow who agrees with assessment and plan.       Waynetta Pean, PA-C 06/29/15 1555  Pattricia Boss, MD 06/29/15 [phone]

## 2015-06-29 NOTE — Discharge Instructions (Signed)
Urinary Tract Infection Urinary tract infections (UTIs) can develop anywhere along your urinary tract. Your urinary tract is your body's drainage system for removing wastes and extra water. Your urinary tract includes two kidneys, two ureters, a bladder, and a urethra. Your kidneys are a pair of bean-shaped organs. Each kidney is about the size of your fist. They are located below your ribs, one on each side of your spine. CAUSES Infections are caused by microbes, which are microscopic organisms, including fungi, viruses, and bacteria. These organisms are so small that they can only be seen through a microscope. Bacteria are the microbes that most commonly cause UTIs. SYMPTOMS  Symptoms of UTIs may vary by age and gender of the patient and by the location of the infection. Symptoms in young women typically include a frequent and intense urge to urinate and a painful, burning feeling in the bladder or urethra during urination. Older women and men are more likely to be tired, shaky, and weak and have muscle aches and abdominal pain. A fever may mean the infection is in your kidneys. Other symptoms of a kidney infection include pain in your back or sides below the ribs, nausea, and vomiting. DIAGNOSIS To diagnose a UTI, your caregiver will ask you about your symptoms. Your caregiver will also ask you to provide a urine sample. The urine sample will be tested for bacteria and white blood cells. White blood cells are made by your body to help fight infection. TREATMENT  Typically, UTIs can be treated with medication. Because most UTIs are caused by a bacterial infection, they usually can be treated with the use of antibiotics. The choice of antibiotic and length of treatment depend on your symptoms and the type of bacteria causing your infection. HOME CARE INSTRUCTIONS  If you were prescribed antibiotics, take them exactly as your caregiver instructs you. Finish the medication even if you feel better after  you have only taken some of the medication.  Drink enough water and fluids to keep your urine clear or pale yellow.  Avoid caffeine, tea, and carbonated beverages. They tend to irritate your bladder.  Empty your bladder often. Avoid holding urine for long periods of time.  Empty your bladder before and after sexual intercourse.  After a bowel movement, women should cleanse from front to back. Use each tissue only once. SEEK MEDICAL CARE IF:   You have back pain.  You develop a fever.  Your symptoms do not begin to resolve within 3 days. SEEK IMMEDIATE MEDICAL CARE IF:   You have severe back pain or lower abdominal pain.  You develop chills.  You have nausea or vomiting.  You have continued burning or discomfort with urination. MAKE SURE YOU:   Understand these instructions.  Will watch your condition.  Will get help right away if you are not doing well or get worse.   This information is not intended to replace advice given to you by your health care provider. Make sure you discuss any questions you have with your health care provider.   Document Released: 05/02/2005 Document Revised: 04/13/2015 Document Reviewed: 08/31/2011 Elsevier Interactive Patient Education 2016 [place] Headache Without Cause A headache is pain or discomfort felt around the head or neck area. The specific cause of a headache may not be found. There are many causes and types of headaches. A few common ones are:  Tension headaches.  Migraine headaches.  Cluster headaches.  Chronic daily headaches. HOME CARE INSTRUCTIONS  Watch your condition for  any changes. Take these steps to help with your condition: Managing Pain  Take over-the-counter and prescription medicines only as told by your health care provider.  Lie down in a dark, quiet room when you have a headache.  If directed, apply ice to the head and neck area:  Put ice in a plastic bag.  Place a towel between your  skin and the bag.  Leave the ice on for 20 minutes, 2-3 times per day.  Use a heating pad or hot shower to apply heat to the head and neck area as told by your health care provider.  Keep lights dim if bright lights bother you or make your headaches worse. Eating and Drinking  Eat meals on a regular schedule.  Limit alcohol use.  Decrease the amount of caffeine you drink, or stop drinking caffeine. General Instructions  Keep all follow-up visits as told by your health care provider. This is important.  Keep a headache journal to help find out what may trigger your headaches. For example, write down:  What you eat and drink.  How much sleep you get.  Any change to your diet or medicines.  Try massage or other relaxation techniques.  Limit stress.  Sit up straight, and do not tense your muscles.  Do not use tobacco products, including cigarettes, chewing tobacco, or e-cigarettes. If you need help quitting, ask your health care provider.  Exercise regularly as told by your health care provider.  Sleep on a regular schedule. Get 7-9 hours of sleep, or the amount recommended by your health care provider. SEEK MEDICAL CARE IF:   Your symptoms are not helped by medicine.  You have a headache that is different from the usual headache.  You have nausea or you vomit.  You have a fever. SEEK IMMEDIATE MEDICAL CARE IF:   Your headache becomes severe.  You have repeated vomiting.  You have a stiff neck.  You have a loss of vision.  You have problems with speech.  You have pain in the eye or ear.  You have muscular weakness or loss of muscle control.  You lose your balance or have trouble walking.  You feel faint or pass out.  You have confusion.   This information is not intended to replace advice given to you by your health care provider. Make sure you discuss any questions you have with your health care provider.   Document Released: 07/23/2005 Document  Revised: 04/13/2015 Document Reviewed: 11/15/2014 Elsevier Interactive Patient Education 2016 [employer]. Trichomoniasis Trichomoniasis is an infection caused by an organism called Trichomonas. The infection can affect both women and men. In women, the outer female genitalia and the vagina are affected. In men, the penis is mainly affected, but the prostate and other reproductive organs can also be involved. Trichomoniasis is a sexually transmitted infection (STI) and is most often passed to another person through sexual contact.  RISK FACTORS  Having unprotected sexual intercourse.  Having sexual intercourse with an infected partner. SIGNS AND SYMPTOMS  Symptoms of trichomoniasis in women include:  Abnormal gray-green frothy vaginal discharge.  Itching and irritation of the vagina.  Itching and irritation of the area outside the vagina. Symptoms of trichomoniasis in men include:   Penile discharge with or without pain.  Pain during urination. This results from inflammation of the urethra. DIAGNOSIS  Trichomoniasis may be found during a Pap test or physical exam. Your health care provider may use one of the following methods to help diagnose this  infection:  Testing the pH of the vagina with a test tape.  Using a vaginal swab test that checks for the Trichomonas organism. A test is available that provides results within a few minutes.  Examining a urine sample.  Testing vaginal secretions. Your health care provider may test you for other STIs, including HIV. TREATMENT   You may be given medicine to fight the infection. Women should inform their health care provider if they could be or are pregnant. Some medicines used to treat the infection should not be taken during pregnancy.  Your health care provider may recommend over-the-counter medicines or creams to decrease itching or irritation.  Your sexual partner will need to be treated if infected.  Your health care provider  may test you for infection again 3 months after treatment. HOME CARE INSTRUCTIONS   Take medicines only as directed by your health care provider.  Take over-the-counter medicine for itching or irritation as directed by your health care provider.  Do not have sexual intercourse while you have the infection.  Women should not douche or wear tampons while they have the infection.  Discuss your infection with your partner. Your partner may have gotten the infection from you, or you may have gotten it from your partner.  Have your sex partner get examined and treated if necessary.  Practice safe, informed, and protected sex.  See your health care provider for other STI testing. SEEK MEDICAL CARE IF:   You still have symptoms after you finish your medicine.  You develop abdominal pain.  You have pain when you urinate.  You have bleeding after sexual intercourse.  You develop a rash.  Your medicine makes you sick or makes you throw up (vomit). MAKE SURE YOU:  Understand these instructions.  Will watch your condition.  Will get help right away if you are not doing well or get worse.   This information is not intended to replace advice given to you by your health care provider. Make sure you discuss any questions you have with your health care provider.   Document Released: 01/16/2001 Document Revised: 08/13/2014 Document Reviewed: 05/04/2013 Elsevier Interactive Patient Education 2016 [employer] Nausea and Vomiting Nausea is a sick feeling that often comes before throwing up (vomiting). Vomiting is a reflex where stomach contents come out of your mouth. Vomiting can cause severe loss of body fluids (dehydration). Children and elderly adults can become dehydrated quickly, especially if they also have diarrhea. Nausea and vomiting are symptoms of a condition or disease. It is important to find the cause of your symptoms. CAUSES   Direct irritation of the stomach lining. This  irritation can result from increased acid production (gastroesophageal reflux disease), infection, food poisoning, taking certain medicines (such as nonsteroidal anti-inflammatory drugs), alcohol use, or tobacco use.  Signals from the brain.These signals could be caused by a headache, heat exposure, an inner ear disturbance, increased pressure in the brain from injury, infection, a tumor, or a concussion, pain, emotional stimulus, or metabolic problems.  An obstruction in the gastrointestinal tract (bowel obstruction).  Illnesses such as diabetes, hepatitis, gallbladder problems, appendicitis, kidney problems, cancer, sepsis, atypical symptoms of a heart attack, or eating disorders.  Medical treatments such as chemotherapy and radiation.  Receiving medicine that makes you sleep (general anesthetic) during surgery. DIAGNOSIS Your caregiver may ask for tests to be done if the problems do not improve after a few days. Tests may also be done if symptoms are severe or if the reason for  the nausea and vomiting is not clear. Tests may include:  Urine tests.  Blood tests.  Stool tests.  Cultures (to look for evidence of infection).  X-rays or other imaging studies. Test results can help your caregiver make decisions about treatment or the need for additional tests. TREATMENT You need to stay well hydrated. Drink frequently but in small amounts.You may wish to drink water, sports drinks, clear broth, or eat frozen ice pops or gelatin dessert to help stay hydrated.When you eat, eating slowly may help prevent nausea.There are also some antinausea medicines that may help prevent nausea. HOME CARE INSTRUCTIONS   Take all medicine as directed by your caregiver.  If you do not have an appetite, do not force yourself to eat. However, you must continue to drink fluids.  If you have an appetite, eat a normal diet unless your caregiver tells you differently.  Eat a variety of complex carbohydrates  (rice, wheat, potatoes, bread), lean meats, yogurt, fruits, and vegetables.  Avoid high-fat foods because they are more difficult to digest.  Drink enough water and fluids to keep your urine clear or pale yellow.  If you are dehydrated, ask your caregiver for specific rehydration instructions. Signs of dehydration may include:  Severe thirst.  Dry lips and mouth.  Dizziness.  Dark urine.  Decreasing urine frequency and amount.  Confusion.  Rapid breathing or pulse. SEEK IMMEDIATE MEDICAL CARE IF:   You have blood or brown flecks (like coffee grounds) in your vomit.  You have black or bloody stools.  You have a severe headache or stiff neck.  You are confused.  You have severe abdominal pain.  You have chest pain or trouble breathing.  You do not urinate at least once every 8 hours.  You develop cold or clammy skin.  You continue to vomit for longer than 24 to 48 hours.  You have a fever. MAKE SURE YOU:   Understand these instructions.  Will watch your condition.  Will get help right away if you are not doing well or get worse.   This information is not intended to replace advice given to you by your health care provider. Make sure you discuss any questions you have with your health care provider.   Document Released: 07/23/2005 Document Revised: 10/15/2011 Document Reviewed: 12/20/2010 Elsevier Interactive Patient Education [employer].

## 2015-06-30 LAB — URINE CULTURE

## 2015-07-01 ENCOUNTER — Telehealth: Payer: Self-pay | Admitting: *Deleted

## 2015-07-01 NOTE — Telephone Encounter (Signed)
Left message for a return phone call to follow up after start of radiation.   

## 2015-07-04 ENCOUNTER — Ambulatory Visit
Admission: RE | Admit: 2015-07-04 | Discharge: 2015-07-04 | Disposition: A | Discharge status: Home / Self Care | Payer: No Typology Code available for payment source | Source: Ambulatory Visit | Attending: Radiation Oncology | Admitting: Radiation Oncology

## 2015-07-04 DIAGNOSIS — Z510 Encounter for antineoplastic radiation therapy: Secondary | ICD-10-CM | POA: Diagnosis not present

## 2015-07-05 ENCOUNTER — Ambulatory Visit
Admission: RE | Admit: 2015-07-05 | Discharge: 2015-07-05 | Disposition: A | Discharge status: Home / Self Care | Payer: No Typology Code available for payment source | Source: Ambulatory Visit | Attending: Radiation Oncology | Admitting: Radiation Oncology

## 2015-07-05 ENCOUNTER — Encounter: Payer: Self-pay | Admitting: Radiation Oncology

## 2015-07-05 VITALS — BP 140/87 | HR 79 | Temp 98.6°F | Ht 69.0 in | Wt 279.0 lb

## 2015-07-05 DIAGNOSIS — C50412 Malignant neoplasm of upper-outer quadrant of left female breast: Secondary | ICD-10-CM

## 2015-07-05 DIAGNOSIS — Z510 Encounter for antineoplastic radiation therapy: Secondary | ICD-10-CM | POA: Diagnosis not present

## 2015-07-05 NOTE — Progress Notes (Signed)
Margaret [name] has completed 4 fractions to her left chest wall.  She denies having any pain.  She did go to the ER last Wednesday for a virus/bladder infection.  She is taking kefliex.  She reports feeling fatigued.  The skin on her left chest wall has slight hyperpigmentation.  BP 140/87 mmHg  Pulse 79  Temp(Src) 98.6 F (37 C) (Oral)  Ht 5\' 9"  (1.753 m)  Wt 279 lb (126.554 kg)  BMI 41.18 kg/m2

## 2015-07-05 NOTE — Progress Notes (Signed)
  Radiation Oncology         [phone] ________________________________  Name: Margaret [name] MRN: [id number]  Date: 07/05/2015  DOB: 1960-10-08  Weekly Radiation Therapy Management    ICD-9-CM ICD-10-CM   1. Breast cancer of upper-outer quadrant of left female breast ([facility]) 174.4 C50.412    DIAGNOSIS: Clincal Stage IIIA (T3, N1, M0) left breast, invasive ductal  Current Dose: 7.2 Gy     Planned Dose:  59.4 Gy  Narrative . . . . . . . . The patient presents for routine under treatment assessment.                                   The patient is without complaint. Last week the patient felt poorly and presented to the emergency room. She was diagnosed with possibly bladder infection  or viral illness. The patient is feeling much better at this time.                                 Set-up films were reviewed.                                 The chart was checked. Physical Findings. . .  height is 5\' 9"  (1.753 m) and weight is 279 lb (126.554 kg). Her oral temperature is 98.6 F (37 C). Her blood pressure is 140/87 and her pulse is 79. . The lungs are clear. The heart has regular rhythm and rate. Examination of the left chest wall and supraclavicular region reveals no significant radiation reaction at this time. Impression . . . . . . . The patient is tolerating radiation. Plan . . . . . . . . . . . . Continue treatment as planned.  ________________________________   Blair Promise, PhD, MD

## 2015-07-06 ENCOUNTER — Ambulatory Visit
Admission: RE | Admit: 2015-07-06 | Discharge: 2015-07-06 | Disposition: A | Discharge status: Home / Self Care | Payer: No Typology Code available for payment source | Source: Ambulatory Visit | Attending: Radiation Oncology | Admitting: Radiation Oncology

## 2015-07-06 DIAGNOSIS — Z510 Encounter for antineoplastic radiation therapy: Secondary | ICD-10-CM | POA: Diagnosis not present

## 2015-07-07 ENCOUNTER — Ambulatory Visit
Admission: RE | Admit: 2015-07-07 | Discharge: 2015-07-07 | Disposition: A | Discharge status: Home / Self Care | Payer: No Typology Code available for payment source | Source: Ambulatory Visit | Attending: Radiation Oncology | Admitting: Radiation Oncology

## 2015-07-07 DIAGNOSIS — Z510 Encounter for antineoplastic radiation therapy: Secondary | ICD-10-CM | POA: Diagnosis not present

## 2015-07-08 ENCOUNTER — Ambulatory Visit
Admission: RE | Admit: 2015-07-08 | Discharge: 2015-07-08 | Disposition: A | Discharge status: Home / Self Care | Payer: No Typology Code available for payment source | Source: Ambulatory Visit | Attending: Radiation Oncology | Admitting: Radiation Oncology

## 2015-07-08 DIAGNOSIS — Z510 Encounter for antineoplastic radiation therapy: Secondary | ICD-10-CM | POA: Diagnosis not present

## 2015-07-11 ENCOUNTER — Ambulatory Visit
Admission: RE | Admit: 2015-07-11 | Discharge: 2015-07-11 | Disposition: A | Discharge status: Home / Self Care | Payer: No Typology Code available for payment source | Source: Ambulatory Visit | Attending: Radiation Oncology | Admitting: Radiation Oncology

## 2015-07-11 DIAGNOSIS — Z510 Encounter for antineoplastic radiation therapy: Secondary | ICD-10-CM | POA: Diagnosis not present

## 2015-07-12 ENCOUNTER — Ambulatory Visit
Admission: RE | Admit: 2015-07-12 | Discharge: 2015-07-12 | Disposition: A | Discharge status: Home / Self Care | Payer: No Typology Code available for payment source | Source: Ambulatory Visit | Attending: Radiation Oncology | Admitting: Radiation Oncology

## 2015-07-12 ENCOUNTER — Encounter: Payer: Self-pay | Admitting: Radiation Oncology

## 2015-07-12 VITALS — BP 155/90 | HR 82 | Temp 98.7°F | Ht 69.0 in | Wt 280.2 lb

## 2015-07-12 DIAGNOSIS — Z510 Encounter for antineoplastic radiation therapy: Secondary | ICD-10-CM | POA: Diagnosis not present

## 2015-07-12 DIAGNOSIS — C50412 Malignant neoplasm of upper-outer quadrant of left female breast: Secondary | ICD-10-CM | POA: Diagnosis present

## 2015-07-12 MED ORDER — RADIAPLEXRX EX GEL
Freq: Once | CUTANEOUS | Status: AC
  Administered 2015-07-12: 17:00:00 via TOPICAL

## 2015-07-12 NOTE — Progress Notes (Signed)
Margaret [name] has completed 9 fractions to her left chest wall.  She reports pain due to a tooth ache at a 7/10.  She does not have a dentist.  She reports having fatigue.  She reports the numbness is going away in her left arm and she says she is feeling soreness that she hasn't felt before.  The skin on her left chest has hyperpigmentation.  She is using radiaplex and is requesting a refill. She has been given another tube.    BP 155/90 mmHg  Pulse 82  Temp(Src) 98.7 F (37.1 C) (Oral)  Ht 5\' 9"  (1.753 m)  Wt 280 lb 3.2 oz (127.098 kg)  BMI 41.36 kg/m2

## 2015-07-12 NOTE — Addendum Note (Signed)
Encounter addended by: Jacqulyn Liner, RN on: 07/12/2015  5:05 PM<BR>     Documentation filed: Medications, Dx Association, Inpatient MAR, Orders

## 2015-07-12 NOTE — Progress Notes (Signed)
  Radiation Oncology         [phone] ________________________________  Name: Margaret [name] MRN: [id number]  Date: 07/12/2015  DOB: [birth year]  Weekly Radiation Therapy Management  Current Dose: 16.2 Gy     Planned Dose:  59.4 Gy  Narrative . . . . . . . . The patient presents for routine under treatment assessment.                                   The patient has noticed occasional sharp shooting pain within the chest wall area. She denies any itching in the treatment area or significant fatigue. She is bothered by toothache                                 Set-up films were reviewed.                                 The chart was checked. Physical Findings. . .  height is 5\' 9"  (1.753 m) and weight is 280 lb 3.2 oz (127.098 kg). Her oral temperature is 98.7 F (37.1 C). Her blood pressure is 155/90 and her pulse is 82. . The lungs are clear. The heart has a regular rhythm and rate. The left chest wall area shows mild hyperpigmentation changes. No skin breakdown. Impression . . . . . . . The patient is tolerating radiation. Plan . . . . . . . . . . . . Continue treatment as planned.  ________________________________   Blair Promise, PhD, MD

## 2015-07-13 ENCOUNTER — Ambulatory Visit
Admission: RE | Admit: 2015-07-13 | Discharge: 2015-07-13 | Disposition: A | Discharge status: Home / Self Care | Payer: No Typology Code available for payment source | Source: Ambulatory Visit | Attending: Radiation Oncology | Admitting: Radiation Oncology

## 2015-07-13 ENCOUNTER — Telehealth: Payer: Self-pay | Admitting: Oncology

## 2015-07-13 DIAGNOSIS — Z510 Encounter for antineoplastic radiation therapy: Secondary | ICD-10-CM | POA: Diagnosis not present

## 2015-07-13 NOTE — Telephone Encounter (Signed)
Called Lattie Haw in Dental Medicine to see about getting Margaret [name] an appointment with Dr. Enrique Sack.  Per Lattie Haw, it would be a better option for her to be established with a regular dentist so that she can have regular cleanings as this service is not provided by dental medicine.  She recommended [facility] at [phone].

## 2015-07-14 ENCOUNTER — Ambulatory Visit
Admission: RE | Admit: 2015-07-14 | Discharge: 2015-07-14 | Disposition: A | Discharge status: Home / Self Care | Payer: No Typology Code available for payment source | Source: Ambulatory Visit | Attending: Radiation Oncology | Admitting: Radiation Oncology

## 2015-07-14 DIAGNOSIS — Z510 Encounter for antineoplastic radiation therapy: Secondary | ICD-10-CM | POA: Diagnosis not present

## 2015-07-15 ENCOUNTER — Ambulatory Visit
Admission: RE | Admit: 2015-07-15 | Discharge: 2015-07-15 | Disposition: A | Discharge status: Home / Self Care | Payer: No Typology Code available for payment source | Source: Ambulatory Visit | Attending: Radiation Oncology | Admitting: Radiation Oncology

## 2015-07-15 DIAGNOSIS — Z510 Encounter for antineoplastic radiation therapy: Secondary | ICD-10-CM | POA: Diagnosis not present

## 2015-07-18 ENCOUNTER — Ambulatory Visit
Admission: RE | Admit: 2015-07-18 | Discharge: 2015-07-18 | Disposition: A | Discharge status: Home / Self Care | Payer: No Typology Code available for payment source | Source: Ambulatory Visit | Attending: Radiation Oncology | Admitting: Radiation Oncology

## 2015-07-18 DIAGNOSIS — Z510 Encounter for antineoplastic radiation therapy: Secondary | ICD-10-CM | POA: Diagnosis not present

## 2015-07-19 ENCOUNTER — Ambulatory Visit
Admission: RE | Admit: 2015-07-19 | Discharge: 2015-07-19 | Disposition: A | Discharge status: Home / Self Care | Payer: No Typology Code available for payment source | Source: Ambulatory Visit | Attending: Radiation Oncology | Admitting: Radiation Oncology

## 2015-07-19 ENCOUNTER — Encounter: Payer: Self-pay | Admitting: Radiation Oncology

## 2015-07-19 VITALS — BP 170/92 | HR 86 | Temp 98.4°F | Resp 18 | Ht 69.0 in | Wt 279.2 lb

## 2015-07-19 DIAGNOSIS — C50412 Malignant neoplasm of upper-outer quadrant of left female breast: Secondary | ICD-10-CM

## 2015-07-19 DIAGNOSIS — Z510 Encounter for antineoplastic radiation therapy: Secondary | ICD-10-CM | POA: Diagnosis not present

## 2015-07-19 MED ORDER — OXYCODONE-ACETAMINOPHEN 5-325 MG PO TABS: 1.0000 | ORAL_TABLET | Freq: Four times a day (QID) | ORAL | Status: DC | PRN

## 2015-07-19 NOTE — Progress Notes (Signed)
Margaret [name] has completed 14 fractions to her left chest wall.  She reports severe soreness (10/10) in her left chest.  She took ibuprofen 5 200 mg tablets today that took the edge off the pain.  She reports having fatigue.  The skin on her left chest wall has hyperpigmentation and appears swollen on the left portion.  She is using radiaplex.  BP 170/92 mmHg  Pulse 86  Temp(Src) 98.4 F (36.9 C) (Oral)  Resp 18  Ht 5\' 9"  (1.753 m)  Wt 279 lb 3.2 oz (126.644 kg)  BMI 41.21 kg/m2

## 2015-07-19 NOTE — Progress Notes (Signed)
  Radiation Oncology         [phone] ________________________________  Name: Margaret [name] MRN: [id number]  Date: 07/19/2015  DOB: Jan 02, 1961  Weekly Radiation Therapy Management    ICD-9-CM ICD-10-CM   1. Breast cancer of upper-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) 174.4 C50.412      Current Dose: 25.2 Gy     Planned Dose:  59.4 Gy  Narrative . . . . . . . . The patient presents for routine under treatment assessment.                                   The patient is complaining of severe pain along the medial bilateral mastectomy scars and sternum area. This started late last week and has worsened. She has been taking ibuprofen for this which is helped somewhat. Patient continues on gabapentin which is helped with her sensation of itching and phantom breast sensation. she denies any chills or fever                                 Set-up films were reviewed.                                 The chart was checked. Physical Findings. . .  height is 5\' 9"  (1.753 m) and weight is 279 lb 3.2 oz (126.644 kg). Her oral temperature is 98.4 F (36.9 C). Her blood pressure is 170/92 and her pulse is 86. Her respiration is 18. . Weight essentially stable.  No significant changes. The lungs are clear. The heart has a regular rhythm and rate. The patient shows hyperpigmentation changes along the left chest wall. Some swelling along the mastectomy scar medially. No signs of infection or drainage. Patient is exquisitely tender with palpation along the bilateral medial mastectomy scar region. She is also tender with palpation along the sternum area Impression . . . . . . . The patient is tolerating radiation. Unsure of the etiology of the patient's discomfort along the sternum region. She may possibly have costochondritis. I recommended she continue her ibuprofen with food as she is off chemotherapy at this time. Have also given her a prescription for Percocet to use if she is having difficulty  sleeping at night. Plan . . . . . . . . . . . . Continue treatment as planned.  ________________________________   Blair Promise, PhD, MD

## 2015-07-20 ENCOUNTER — Ambulatory Visit
Admission: RE | Admit: 2015-07-20 | Discharge: 2015-07-20 | Disposition: A | Discharge status: Home / Self Care | Payer: No Typology Code available for payment source | Source: Ambulatory Visit | Attending: Radiation Oncology | Admitting: Radiation Oncology

## 2015-07-20 DIAGNOSIS — Z510 Encounter for antineoplastic radiation therapy: Secondary | ICD-10-CM | POA: Diagnosis not present

## 2015-07-21 ENCOUNTER — Ambulatory Visit
Admission: RE | Admit: 2015-07-21 | Discharge: 2015-07-21 | Disposition: A | Discharge status: Home / Self Care | Payer: No Typology Code available for payment source | Source: Ambulatory Visit | Attending: Radiation Oncology | Admitting: Radiation Oncology

## 2015-07-21 DIAGNOSIS — Z510 Encounter for antineoplastic radiation therapy: Secondary | ICD-10-CM | POA: Diagnosis not present

## 2015-07-22 ENCOUNTER — Ambulatory Visit
Admission: RE | Admit: 2015-07-22 | Discharge: 2015-07-22 | Disposition: A | Discharge status: Home / Self Care | Payer: No Typology Code available for payment source | Source: Ambulatory Visit | Attending: Radiation Oncology | Admitting: Radiation Oncology

## 2015-07-22 DIAGNOSIS — Z510 Encounter for antineoplastic radiation therapy: Secondary | ICD-10-CM | POA: Diagnosis not present

## 2015-07-22 NOTE — Progress Notes (Signed)
[place]  [phone] [phone]     ID: ALYZAH PELLY DOB: [birth year]  MR#: [id number]  WER#:[id number]  Patient Care Team: No Pcp Per Patient as PCP - General (General Practice) Autumn Messing III, MD as Consulting Physician (General Surgery) Chauncey Cruel, MD as Consulting Physician (Oncology) Gery Pray, MD as Consulting Physician (Radiation Oncology) Mauro Kaufmann, RN as Registered Nurse Rockwell Germany, RN as Registered Nurse PCP: No PCP Per Patient GYN: OTHER MD:  CHIEF COMPLAINT: Locally advanced estrogen receptor positive breast cancer; bilateral breast cancer  CURRENT TREATMENT: Adjuvant radiation   BREAST CANCER HISTORY: From the original intake note:  Doha's primary care physician retired sometime ago and her health maintenance has not been up-to-date. She has been receiving medical care through the emergency room and was seen in March 2015 following a head injury, then in August 2015 for lancing of an abscess. In December 2015 she noticed that her left breast looked a little bit smaller than her right. She brought this to the attention of friends and family over the next several months but the general feeling was that most people are not perfectly symmetrical. By the summer of this year she noticed that her nipple on the left was "going in". More recently she started developing pain in the left breast. She was evaluated for this in the emergency room 04/09/2015 at which time a left breast exam showed a deformed left breast with a large hard mass encompassing most of the breast, with nipple retraction. There was no nipple discharge or bleeding and no palpable adenopathy.  The patient was referred to [facility] and on 04/13/2015 she underwent bilateral diagnostic mammography with tomosynthesis as well as bilateral breast ultrasound. The breast density was category B. In the right breast at the 11:00 position there was a 1.3 cm irregular mass  which by ultrasonography measured 1.3 cm.--In the left breast there was a 4 cm irregular mass at the 2:00 position associated with nipple retraction. There was a second, 1.7 cm area of architectural distortion at the 9:00 position. Both were palpable. By ultrasonography, the 4 cm irregular mass was noted, with a second mass measuring 2.5 cm by ultrasonography. Both axillae were benign.  On 04/19/2015 the patient underwent right breast upper outer quadrant biopsy, showing ([id number]) and invasive ductal carcinoma, grade 1, estrogen receptor 90% positive, progesterone receptor negative, with an MIB-1 of 5%, and no HER-2 amplification, the signals ratio being 1.26 and the number per cell 2.20.  On the same day, she underwent biopsy of the 2 left breast masses in question as well as a suspicious left axillary lymph node. All 3 biopsies showed invasive ductal carcinoma, grade 2, estrogen receptor 80-100% positive, progesterone receptor 80-100% positive, with the MIB-1 between 5 and 10%, and no HER-2 amplification, the signals ratio being between 1.05 and 1.13, and the number per cell between 2.10 and 2.25.  The patient's subsequent history is as detailed below  INTERVAL HISTORY: Jinny returns today for follow-up of her bilateral breast cancer. Since her last visit here she underwent bilateral mastectomies, with right sentinel lymph node sampling and left axillary lymph node dissection. On the left side she had an invasive ductal carcinoma measuring 15 cm, grade 2, with 4 out of 14 lymph nodes involved, with extracapsular extension. Margins were negative. On the right there was invasive ductal carcinoma, grade 1, measuring 2.6 cm, with both sentinel lymph nodes clear. HER-2 was repeated on the left, and was  again negative, with a ratio of 1.18, and the number per cell being 2.30. On the right HER-2 was negative again with a ratio of 1.29 and the average number per cell 2.0. Oncotype on the right showed a  recurrence score of 12, predicting a risk of outside the breast recurrence of 8% if her only systemic therapy is tamoxifen for 5 years. On the left, a Mammaprint was obtained, which came back as low risk, luminal a subtype. This suggests a distant recurrence probability within 5 years of 8% with endocrine therapy only, increasing by 4% if she receives chemotherapy. The patient started radiation 07/05/2015. She is here today to discuss systemic treatmentnoptions  REVIEW OF SYSTEMS: She did well with the surgery, without unusual pain, fever, bleeding, or other complications. She does have discomfort on the left axilla and left arm, and she is taking gabapentin and Robaxin for that. She has not been able to get back to work. A detailed review of systems today was otherwise noncontributory  PAST MEDICAL HISTORY: Past Medical History  Diagnosis Date  . Hypertension   . Breast cancer of upper-outer quadrant of left female breast ([place]) 04/21/2015  . Breast cancer ([place])   . GERD (gastroesophageal reflux disease)   . Bilateral breast cancer ([place])     PAST SURGICAL HISTORY: Past Surgical History  Procedure Laterality Date  . Abdominal hysterectomy  1990  . Tubal ligation  1984  . Mastectomy w/ sentinel node biopsy Right   . Mastectomy modified radical Left 05/09/2015  . Mastectomy w/ sentinel node biopsy Right 05/09/2015    Procedure: RIGHT MASTECTOMY WITH RIGHT AXILLARY SENTINEL LYMPH NODE BIOPSY;  Surgeon: Autumn Messing III, MD;  Location: [facility];  Service: General;  Laterality: Right;  . Mastectomy modified radical Left 05/09/2015    Procedure: LEFT MODIFIED RADICAL MASTETCTOMY;  Surgeon: Autumn Messing III, MD;  Location: [facility];  Service: General;  Laterality: Left;    FAMILY HISTORY Family History  Problem Relation Age of Onset  . Hypertension Mother   . Diabetes Mother     the patient has little information on her father. Her mother is living, currently age 84. She has one brother, 2 sisters. There  is no history of breast or ovarian cancer in the family to her knowledge.   GYNECOLOGIC HISTORY:  No LMP recorded. Patient has had a hysterectomy.  menarche age 82, first live birth age 69. She is GX P4. She underwent a total abdominal hysterectomy with bilateral salpingo-oophorectomy at age 84. She did not take hormone replacement. She never used oral contraceptives.   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 She is not employed by [employer] but by a Set designer. She is single and lives with her significant other [name]. He is status post renal transplant. The patient's daughter  [name] works as a Programmer, applications, as does her daughter  [name]. Daughter [name] works at [employer], as a call. All 3 live in [place]. The patient's son Marc Morgans Albarracin's lives in [place]. He prepares sets for shows The patient has 4 grandchildren, no great grandchildren. She attends a [employer] in [place].     ADVANCED DIRECTIVES: Not in place. At the initial clinic visit 07/25/2015 the patient was given the appropriate forms to complete and notarize at her discretion. The patient intends to name her daughterRaniesh Jimmye Norman as healthcare power of attorney. She can be reached at  [phone].     HEALTH MAINTENANCE: Social History  Substance Use Topics  . Smoking status: Current Some Day Smoker -- 0.10 packs/day for 28 years    Types: Cigarettes  . Smokeless tobacco: Never Used     Comment: smokes 1 cigarette every other day  . Alcohol Use: No     Colonoscopy: Never  PAP: Status post hysterectomy  Bone density: Never  Lipid panel:  Allergies  Allergen Reactions  . Latex Itching and Other (See Comments)    burning  . 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 Wheat Bran Hives    Current Outpatient Prescriptions  Medication Sig Dispense Refill  . omeprazole  (PRILOSEC) 20 MG capsule Take 1 capsule (20 mg total) by mouth daily. 60 capsule 1  . acetaminophen (TYLENOL) 500 MG tablet Take 1,000 mg by mouth every 6 (six) hours as needed for mild pain.     Marland Kitchen gabapentin (NEURONTIN) 300 MG capsule Take 1 capsule (300 mg total) by mouth 3 (three) times daily. To control nerve pain 50 capsule 2  . hyaluronate sodium (RADIAPLEXRX) GEL Apply 1 application topically once.    Marland Kitchen ibuprofen (ADVIL,MOTRIN) 200 MG tablet Take 800-1,000 mg by mouth 3 (three) times daily as needed for headache.     . losartan (COZAAR) 50 MG tablet Take 50 mg by mouth daily.    . methocarbamol (ROBAXIN) 750 MG tablet Take 1 tablet (750 mg total) by mouth 4 (four) times daily as needed (use for muscle cramps/pain). 30 tablet 2  . non-metallic deodorant (ALRA) MISC Apply 1 application topically daily as needed.     No current facility-administered medications for this visit.    OBJECTIVE: Middle-aged African-American woman in no acute distress Filed Vitals:   07/25/15 0815  BP: 153/89  Pulse: 87  Temp: 98.3 F (36.8 C)  Resp: 18     Body mass index is 41.68 kg/(m^2).    ECOG FS:1 - Symptomatic but completely ambulatory  Sclerae unicteric, pupils round and equal Oropharynx clear and moist-- no thrush or other lesions No cervical or supraclavicular adenopathy Lungs no rales or rhonchi Heart regular rate and rhythm Abd soft, obese, nontender, positive bowel sounds MSK no focal spinal tenderness, no upper extremity lymphedema Neuro: nonfocal, well oriented, appropriate affect Breasts: Status post bilateral mastectomies. The incisions are healing nicely, without erythema, dehiscence, or swelling. Both axillae are benign.   LAB RESULTS:  CMP     Component Value Date/Time   NA 136 06/29/2015 0923   NA 142 04/27/2015 1208   K 4.0 06/29/2015 0923   K 3.1* 04/27/2015 1208   CL 102 06/29/2015 0923   CO2 24 06/29/2015 0923   CO2 29 04/27/2015 1208   GLUCOSE 107* 06/29/2015 0923    GLUCOSE 97 04/27/2015 1208   BUN 7 06/29/2015 0923   BUN 10.0 04/27/2015 1208   CREATININE 0.84 06/29/2015 0923   CREATININE 0.8 04/27/2015 1208   CALCIUM 9.2 06/29/2015 0923   CALCIUM 9.2 04/27/2015 1208   PROT 7.8 06/29/2015 0923   PROT 8.1 04/27/2015 1208   ALBUMIN 3.5 06/29/2015 0923   ALBUMIN 3.7 04/27/2015 1208   AST 20 06/29/2015 0923   AST 24 04/27/2015 1208   ALT 21 06/29/2015 0923   ALT 32 04/27/2015 1208   ALKPHOS 87 06/29/2015 0923   ALKPHOS 107 04/27/2015 1208   BILITOT 1.0 06/29/2015 0923   BILITOT 0.32 04/27/2015 1208   GFRNONAA >60 06/29/2015 0623  GFRAA >60 06/29/2015 0923    INo results found for: SPEP, UPEP  Lab Results  Component Value Date   WBC 14.6* 06/29/2015   NEUTROABS 11.3* 06/29/2015   HGB 13.8 06/29/2015   HCT 42.4 06/29/2015   MCV 89.3 06/29/2015   PLT 223 06/29/2015      Chemistry      Component Value Date/Time   NA 136 06/29/2015 0923   NA 142 04/27/2015 1208   K 4.0 06/29/2015 0923   K 3.1* 04/27/2015 1208   CL 102 06/29/2015 0923   CO2 24 06/29/2015 0923   CO2 29 04/27/2015 1208   BUN 7 06/29/2015 0923   BUN 10.0 04/27/2015 1208   CREATININE 0.84 06/29/2015 0923   CREATININE 0.8 04/27/2015 1208      Component Value Date/Time   CALCIUM 9.2 06/29/2015 0923   CALCIUM 9.2 04/27/2015 1208   ALKPHOS 87 06/29/2015 0923   ALKPHOS 107 04/27/2015 1208   AST 20 06/29/2015 0923   AST 24 04/27/2015 1208   ALT 21 06/29/2015 0923   ALT 32 04/27/2015 1208   BILITOT 1.0 06/29/2015 0923   BILITOT 0.32 04/27/2015 1208       No results found for: LABCA2  No components found for: [id number]  No results for input(s): INR in the last 168 hours.  Urinalysis    Component Value Date/Time   COLORURINE YELLOW 06/29/2015 1030   APPEARANCEUR CLOUDY* 06/29/2015 1030   LABSPEC 1.019 06/29/2015 1030   PHURINE 7.0 06/29/2015 1030   GLUCOSEU NEGATIVE 06/29/2015 1030   HGBUR TRACE* 06/29/2015 1030   BILIRUBINUR NEGATIVE 06/29/2015 1030     KETONESUR NEGATIVE 06/29/2015 1030   PROTEINUR NEGATIVE 06/29/2015 1030   NITRITE NEGATIVE 06/29/2015 1030   LEUKOCYTESUR MODERATE* 06/29/2015 1030    STUDIES: Dg Lumbar Spine Complete  06/29/2015  CLINICAL DATA:  Acute onset low back pain radiating to both legs beginning last night. Breast cancer. EXAM: LUMBAR SPINE - COMPLETE 4+ VIEW COMPARISON:  None. FINDINGS: There is no evidence of lumbar spine fracture. Alignment is normal. Intervertebral disc spaces are maintained. Mild degenerative vertebral osteophyte formation seen at most lumbar levels. Mild facet DJD seen bilaterally at L5-S1. No focal lytic or sclerotic bone lesions identified. IMPRESSION: No acute findings. Mild degenerative spondylosis, as described above. Electronically Signed   By: Earle Gell M.D.   On: 06/29/2015 11:19   CLINICAL DATA: Initial treatment strategy for breast cancer.  EXAM: NUCLEAR MEDICINE PET SKULL BASE TO THIGH  TECHNIQUE: 14.0 mCi F-18 FDG was injected intravenously. Full-ring PET imaging was performed from the skull base to thigh after the radiotracer. CT data was obtained and used for attenuation correction and anatomic localization.  FASTING BLOOD GLUCOSE: Value: 95 mg/dl  COMPARISON: None.  FINDINGS: NECK  There is a focus of intense radiotracer uptake within the for of the right maxillary sinus. This measures approximately 2.6 cm and has an SUV max equal to 26.8. No hypermetabolic lymph nodes identified within the soft tissues of the neck.  CHEST  Postop changes from bilateral mastectomy noted. Left axillary lymph node dissection has been performed. Surgical drain is identified within the left axilla. Nonspecific increased uptake throughout bilateral chest wall and left axilla is likely postoperative. Within the inferior portion of the left axilla there are several prominent lymph nodes. The largest measures 1.2 cm  ABDOMEN/PELVIS  No abnormal hypermetabolic  activity within the liver, pancreas, adrenal glands, or spleen. No hypermetabolic lymph nodes in the abdomen or pelvis.  SKELETON  Diffuse heterogeneous uptake is identified throughout the axial and proximal appendicular skeleton. No focal areas of asymmetric increased uptake identified. On the corresponding CT images there are no aggressive lytic or sclerotic bone lesions noted.  IMPRESSION: 1. No specific findings identified to suggest hypermetabolic metastasis status post bilateral mastectomy and left axillary nodal dissection. 2. Mild diffuse increased radiotracer uptake identified throughout the visualized axial and appendicular skeleton. No corresponding CT abnormalities noted to suggest bone metastases. 3. Increased uptake within the floor of the right maxillary sinus is likely inflammatory or infectious.   Electronically Signed  By: Kerby Moors M.D.  On: 05/24/2015 14:26    ASSESSMENT: 54 y.o.  woman with synchronous breast cancers, as follows  (a) status post right breast upper outer quadrant biopsy 04/19/2015 for a clinical pT1c N0, stage IA invasive ductal carcinoma, grade 1, estrogen receptor positive, progesterone receptor negative, with an MIB-1 of 5%, and no HER-2 amplification  (b) status post left breast biopsy 2 and left axillary lymph node biopsy 04/19/2015 for a clinical T3 N1, stage IIIA invasive ductal carcinoma, grade 1 or 2, estrogen receptor and progesterone receptor positive, HER-2 negative, with an MIB-1 between 5 and 10%  (1) status post bilateral mastectomies 05/09/2015, showing:   (a) on the right side, a pT2 pN0, stage IIA invasive ductal carcinoma, with negative margins and repeat HER-2 again negative    (b) on the left, a pT3 pN2, stage IIIA invasive ductal carcinoma, grade 2, with negative margins, and repeat HER-2 again negative  (2) Mammaprint from the left-sided tumor returned "luminal type, low risk", predicting a small  benefit from chemotherapy with the important caveat that N2 disease was not included in the MINDACT study  (3) Oncotype from the right-sided tumor showed a score of 12, predicting an 8% risk of recurrence outside the breast within the next 10 years, if the patient's only systemic therapy was tamoxifen for 5 years. It also predicted no significant benefit from chemotherapy  (4) postmastectomy radiation on the left to be completed 08/16/2014  (5) adjuvant chemotherapy to consist of cyclophosphamide and docetaxel x4 starting early February 2017  (5) 10 years of antiestrogen therapy to follow with consideration of the PALLAS trial  PLAN: I spent approximately one hour with Trudi today reviewing her situation in detail and giving her all the information that we have at this point.  She understands the right-sided breast cancer, while stage II, was node-negative, low-grade, and had a low Oncotype score. This predicts no significant benefit from chemotherapy. She understands all the patient's in the [place] took anti-estrogens for a minimum of 5 years.  The left-sided tumor of course is the crux of the matter. This was a very large (neglected) tumor, grade 2. It proved to be N2, with 4 out of 14 lymph nodes sampled involved by her cancer. Because of that fourth positive lymph nodes she would not have been eligible for the MINDACT study. We nevertheless obtained a Mammaprint on this tumor so we could give her more information and it came back luminal, low risk. In that study node-positive low-genetic-risk patients had a small benefit from additional chemotherapy (less than 2%). Nevertheless, the standard of care is very clear in cases like hers and my recommendation to her is that she receive cyclophosphamide and docetaxel once she recovers from her radiation treatments.  As before she is very reluctant to receive chemotherapy, partly for the usual concerns regarding side effects, and partly  because she is afraid of losing her  job. I suggested she could work through chemotherapy because it is only given every 3 weeks. We would treated on Thursday and she should be able to get back to work by Monday or Tuesday of the following week and then of course she is not treated for another 2-1/2 weeks. Also she may be able to qualify for disability. I suggested she meet with our social workers today to try to set that up.  She could not clearly decide today, but will come to "chemotherapy school" for further information, and will return to see me January 13. At that point we will make a definitive decision regarding her adjuvant systemic treatment and if all goes well we should be able to start chemotherapy the first week in February, which means she will be done by May of 2017.  Whether or not she in sup accepting chemotherapy, she will be a good candidate for the PALLAS Trial. We will return to that after she has been on a stable antiestrogen at least 2 months.      Chauncey Cruel, MD   07/25/2015 9:09 AM Medical Oncology and Hematology [facility] [address] [place], [place] [address] Tel. [phone]    Fax. [phone]

## 2015-07-22 NOTE — Assessment & Plan Note (Signed)
54 y.o. [place] woman with synchronous breast cancers, as follows (a) status post right breast upper outer quadrant biopsy 04/19/2015 for a clinical pT1c N0, stage IA invasive ductal carcinoma, grade 1, estrogen receptor positive, progesterone receptor negative, with an MIB-1 of 5%, and no HER-2 amplification (b) status post left breast biopsy 2 and left axillary lymph node biopsy 04/19/2015 for a clinical T3 N1, stage IIIA invasive ductal carcinoma, grade 1 or 2, estrogen receptor and progesterone receptor positive, HER-2 negative, with an MIB-1 between 5 and 10%  (1) the patient has opted with bilateral mastectomies with right sentinel lymph node sampling and left axillary lymph node dissection   (2) Mammaprint will be sent from the left-sided tumor   (3) Oncotype will be requested from the right-sided tumor  (4) postmastectomy radiation on the left is planned  (5) 10 years of antiestrogen therapy anticipated

## 2015-07-25 ENCOUNTER — Ambulatory Visit (HOSPITAL_BASED_OUTPATIENT_CLINIC_OR_DEPARTMENT_OTHER): Payer: No Typology Code available for payment source | Admitting: Oncology

## 2015-07-25 ENCOUNTER — Telehealth: Payer: Self-pay | Admitting: Oncology

## 2015-07-25 ENCOUNTER — Ambulatory Visit
Admission: RE | Admit: 2015-07-25 | Discharge: 2015-07-25 | Disposition: A | Discharge status: Home / Self Care | Payer: No Typology Code available for payment source | Source: Ambulatory Visit | Attending: Radiation Oncology | Admitting: Radiation Oncology

## 2015-07-25 VITALS — BP 153/89 | HR 87 | Temp 98.3°F | Resp 18 | Ht 69.0 in | Wt 282.4 lb

## 2015-07-25 DIAGNOSIS — C50412 Malignant neoplasm of upper-outer quadrant of left female breast: Secondary | ICD-10-CM | POA: Diagnosis not present

## 2015-07-25 DIAGNOSIS — C50211 Malignant neoplasm of upper-inner quadrant of right female breast: Secondary | ICD-10-CM

## 2015-07-25 DIAGNOSIS — C773 Secondary and unspecified malignant neoplasm of axilla and upper limb lymph nodes: Secondary | ICD-10-CM

## 2015-07-25 DIAGNOSIS — C50411 Malignant neoplasm of upper-outer quadrant of right female breast: Secondary | ICD-10-CM

## 2015-07-25 DIAGNOSIS — Z510 Encounter for antineoplastic radiation therapy: Secondary | ICD-10-CM | POA: Diagnosis not present

## 2015-07-25 MED ORDER — METHOCARBAMOL 750 MG PO TABS: 750.0000 mg | ORAL_TABLET | Freq: Four times a day (QID) | ORAL | Status: DC | PRN

## 2015-07-25 MED ORDER — GABAPENTIN 300 MG PO CAPS: 300.0000 mg | ORAL_CAPSULE | Freq: Three times a day (TID) | ORAL | Status: DC

## 2015-07-25 NOTE — Telephone Encounter (Signed)
spoke with patient and she is aware of her appointments °

## 2015-07-26 ENCOUNTER — Ambulatory Visit
Admission: RE | Admit: 2015-07-26 | Discharge: 2015-07-26 | Disposition: A | Discharge status: Home / Self Care | Payer: No Typology Code available for payment source | Source: Ambulatory Visit | Attending: Radiation Oncology | Admitting: Radiation Oncology

## 2015-07-26 ENCOUNTER — Encounter: Payer: Self-pay | Admitting: *Deleted

## 2015-07-26 DIAGNOSIS — C50412 Malignant neoplasm of upper-outer quadrant of left female breast: Secondary | ICD-10-CM

## 2015-07-26 DIAGNOSIS — Z510 Encounter for antineoplastic radiation therapy: Secondary | ICD-10-CM | POA: Diagnosis not present

## 2015-07-26 NOTE — Progress Notes (Signed)
  Radiation Oncology         [phone] ________________________________  Name: Margaret [name] MRN: [id number]  Date: 07/26/2015  DOB: 1961/06/01  Weekly Radiation Therapy Management    ICD-9-CM ICD-10-CM   1. Breast cancer of upper-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) 174.4 C50.412     Current Dose: 34.2 Gy     Planned Dose:  59.4 Gy  Narrative . . . . . . . . The patient presents for routine under treatment assessment.                                   The patient is doing better as far as her pain is concerned. she denies any itching  along the left chest wall region                                 Set-up films were reviewed.                                 The chart was checked. Physical Findings. . . The left chest wall area shows hyperpigmentation changes but no moist desquamation Impression . . . . . . . The patient is tolerating radiation. Plan . . . . . . . . . . . . Continue treatment as planned.  Patient had mark out of her custom electron cutout field directed at the mastectomy scar along the right side.  ________________________________   Blair Promise, PhD, MD

## 2015-07-26 NOTE — Progress Notes (Signed)
[place] Work  Clinical Social Work was referred by Futures trader for assessment of psychosocial needs.  Clinical Social Worker contacted patient at home to offer support and assess for needs.  Patient requested information on applying for social security disability.  CSW and patient reviewed SSD application process.  CSW provided patient with contact information and instructions for initiating application.  Once patient has an appointment and receives SSD packet she will call CSW to schedule an appointment to review.  CSW encouraged patient to call with any additional questions or concerns.      Johnnye Lana, MSW, LCSW, OSW-C Clinical Social Worker [facility] [phone]

## 2015-07-27 ENCOUNTER — Ambulatory Visit
Admission: RE | Admit: 2015-07-27 | Discharge: 2015-07-27 | Disposition: A | Discharge status: Home / Self Care | Payer: No Typology Code available for payment source | Source: Ambulatory Visit | Attending: Radiation Oncology | Admitting: Radiation Oncology

## 2015-07-27 DIAGNOSIS — Z510 Encounter for antineoplastic radiation therapy: Secondary | ICD-10-CM | POA: Diagnosis not present

## 2015-07-28 ENCOUNTER — Ambulatory Visit
Admission: RE | Admit: 2015-07-28 | Discharge: 2015-07-28 | Disposition: A | Discharge status: Home / Self Care | Payer: No Typology Code available for payment source | Source: Ambulatory Visit | Attending: Radiation Oncology | Admitting: Radiation Oncology

## 2015-07-28 DIAGNOSIS — Z510 Encounter for antineoplastic radiation therapy: Secondary | ICD-10-CM | POA: Diagnosis not present

## 2015-07-29 ENCOUNTER — Ambulatory Visit
Admission: RE | Admit: 2015-07-29 | Discharge: 2015-07-29 | Disposition: A | Discharge status: Home / Self Care | Payer: No Typology Code available for payment source | Source: Ambulatory Visit | Attending: Radiation Oncology | Admitting: Radiation Oncology

## 2015-07-29 DIAGNOSIS — Z510 Encounter for antineoplastic radiation therapy: Secondary | ICD-10-CM | POA: Diagnosis not present

## 2015-08-02 ENCOUNTER — Ambulatory Visit
Admission: RE | Admit: 2015-08-02 | Discharge: 2015-08-02 | Disposition: A | Discharge status: Home / Self Care | Payer: No Typology Code available for payment source | Source: Ambulatory Visit | Attending: Radiation Oncology | Admitting: Radiation Oncology

## 2015-08-02 ENCOUNTER — Encounter: Payer: Self-pay | Admitting: Radiation Oncology

## 2015-08-02 VITALS — BP 153/89 | HR 75 | Resp 16 | Wt 287.3 lb

## 2015-08-02 DIAGNOSIS — C50211 Malignant neoplasm of upper-inner quadrant of right female breast: Secondary | ICD-10-CM

## 2015-08-02 DIAGNOSIS — Z510 Encounter for antineoplastic radiation therapy: Secondary | ICD-10-CM | POA: Diagnosis not present

## 2015-08-02 MED ORDER — TRAMADOL HCL 50 MG PO TABS: 50.0000 mg | ORAL_TABLET | Freq: Four times a day (QID) | ORAL | Status: DC | PRN
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
## 2015-08-03 ENCOUNTER — Ambulatory Visit
Admission: RE | Admit: 2015-08-03 | Discharge: 2015-08-03 | Disposition: A | Discharge status: Home / Self Care | Payer: No Typology Code available for payment source | Source: Ambulatory Visit | Attending: Radiation Oncology | Admitting: Radiation Oncology

## 2015-08-03 DIAGNOSIS — Z510 Encounter for antineoplastic radiation therapy: Secondary | ICD-10-CM | POA: Diagnosis not present

## 2015-08-04 ENCOUNTER — Ambulatory Visit: Payer: No Typology Code available for payment source | Admitting: Radiation Oncology

## 2015-08-04 ENCOUNTER — Ambulatory Visit
Admission: RE | Admit: 2015-08-04 | Discharge: 2015-08-04 | Disposition: A | Discharge status: Home / Self Care | Payer: No Typology Code available for payment source | Source: Ambulatory Visit | Attending: Radiation Oncology | Admitting: Radiation Oncology

## 2015-08-04 DIAGNOSIS — Z510 Encounter for antineoplastic radiation therapy: Secondary | ICD-10-CM | POA: Diagnosis not present

## 2015-08-05 ENCOUNTER — Ambulatory Visit
Admission: RE | Admit: 2015-08-05 | Discharge: 2015-08-05 | Disposition: A | Discharge status: Home / Self Care | Payer: No Typology Code available for payment source | Source: Ambulatory Visit | Attending: Radiation Oncology | Admitting: Radiation Oncology

## 2015-08-05 DIAGNOSIS — Z510 Encounter for antineoplastic radiation therapy: Secondary | ICD-10-CM | POA: Diagnosis not present

## 2015-08-08 ENCOUNTER — Ambulatory Visit: Payer: No Typology Code available for payment source

## 2015-08-09 ENCOUNTER — Encounter: Payer: Self-pay | Admitting: *Deleted

## 2015-08-09 ENCOUNTER — Other Ambulatory Visit: Payer: No Typology Code available for payment source

## 2015-08-09 ENCOUNTER — Ambulatory Visit
Admission: RE | Admit: 2015-08-09 | Discharge: 2015-08-09 | Disposition: A | Discharge status: Home / Self Care | Payer: No Typology Code available for payment source | Source: Ambulatory Visit | Attending: Radiation Oncology | Admitting: Radiation Oncology

## 2015-08-09 ENCOUNTER — Encounter: Payer: Self-pay | Admitting: Radiation Oncology

## 2015-08-09 ENCOUNTER — Ambulatory Visit
Admission: RE | Admit: 2015-08-09 | Discharge: 2015-08-09 | Disposition: A | Discharge status: Home / Self Care | Payer: BLUE CROSS/BLUE SHIELD | Source: Ambulatory Visit | Attending: Radiation Oncology | Admitting: Radiation Oncology

## 2015-08-09 VITALS — BP 137/74 | HR 87 | Temp 98.0°F | Ht 69.0 in | Wt 285.0 lb

## 2015-08-09 DIAGNOSIS — C50412 Malignant neoplasm of upper-outer quadrant of left female breast: Secondary | ICD-10-CM | POA: Insufficient documentation

## 2015-08-09 DIAGNOSIS — Z9011 Acquired absence of right breast and nipple: Secondary | ICD-10-CM | POA: Diagnosis not present

## 2015-08-09 DIAGNOSIS — Z510 Encounter for antineoplastic radiation therapy: Secondary | ICD-10-CM | POA: Diagnosis not present

## 2015-08-09 DIAGNOSIS — C50211 Malignant neoplasm of upper-inner quadrant of right female breast: Secondary | ICD-10-CM | POA: Diagnosis present

## 2015-08-09 DIAGNOSIS — Z170 Estrogen receptor positive status [ER+]: Secondary | ICD-10-CM | POA: Diagnosis not present

## 2015-08-09 MED ORDER — RADIAPLEXRX EX GEL
Freq: Once | CUTANEOUS | Status: AC
  Administered 2015-08-09: 17:00:00 via TOPICAL

## 2015-08-09 MED ORDER — METHOCARBAMOL 750 MG PO TABS: 750.0000 mg | ORAL_TABLET | Freq: Four times a day (QID) | ORAL | Status: DC | PRN

## 2015-08-09 NOTE — Progress Notes (Signed)
  Radiation Oncology         [phone]) [phone] ________________________________  Name: Margaret [name] MRN: [id number]  Date: 08/09/2015  DOB: 12-24-1960  Weekly Radiation Therapy Management    ICD-9-CM ICD-10-CM   1. Breast cancer of upper-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) 174.4 C50.412      Current Dose: 48.6 Gy     Planned Dose:  59.4 Gy  Narrative . . . . . . . . The patient presents for routine under treatment assessment.                                   The patient is having continue soreness along the chest wall. She continues on gabapentin. Muscle relaxants also are very helpful for her and I refilled this today                                 Set-up films were reviewed.                                 The chart was checked. Physical Findings. . .  height is 5\' 9"  (1.753 m) and weight is 285 lb (129.275 kg). Her oral temperature is 98 F (36.7 C). Her blood pressure is 137/74 and her pulse is 87. . The lungs are clear. The heart has a regular rhythm and rate. The left chest wall area shows hyperpigmentation changes but no significant moist or dry desquamation Impression . . . . . . . The patient is tolerating radiation. Plan . . . . . . . . . . . . Continue treatment as planned.  ________________________________   Blair Promise, PhD, MD

## 2015-08-09 NOTE — Progress Notes (Signed)
Itaty Strength has completed 27 fractions to her left chest wall.  She reports having pain in her mid chest area.  She said the gabapentin and muscle relaxer helps the pain.  She reports having fatigue.  She has hyperpigmentation to her left subclavian area and left chest wall.  She is using radiaplex and has requested a refill.  Another tube have been given.    BP 137/74 mmHg  Pulse 87  Temp(Src) 98 F (36.7 C) (Oral)  Ht 5\' 9"  (1.753 m)  Wt 285 lb (129.275 kg)  BMI 42.07 kg/m2

## 2015-08-10 ENCOUNTER — Ambulatory Visit
Admission: RE | Admit: 2015-08-10 | Discharge: 2015-08-10 | Disposition: A | Discharge status: Home / Self Care | Payer: No Typology Code available for payment source | Source: Ambulatory Visit | Attending: Radiation Oncology | Admitting: Radiation Oncology

## 2015-08-10 DIAGNOSIS — Z510 Encounter for antineoplastic radiation therapy: Secondary | ICD-10-CM | POA: Diagnosis not present

## 2015-08-11 ENCOUNTER — Ambulatory Visit
Admission: RE | Admit: 2015-08-11 | Discharge: 2015-08-11 | Disposition: A | Discharge status: Home / Self Care | Payer: No Typology Code available for payment source | Source: Ambulatory Visit | Attending: Radiation Oncology | Admitting: Radiation Oncology

## 2015-08-11 DIAGNOSIS — Z510 Encounter for antineoplastic radiation therapy: Secondary | ICD-10-CM | POA: Diagnosis not present

## 2015-08-12 ENCOUNTER — Ambulatory Visit
Admission: RE | Admit: 2015-08-12 | Discharge: 2015-08-12 | Disposition: A | Discharge status: Home / Self Care | Payer: No Typology Code available for payment source | Source: Ambulatory Visit | Attending: Radiation Oncology | Admitting: Radiation Oncology

## 2015-08-12 DIAGNOSIS — Z510 Encounter for antineoplastic radiation therapy: Secondary | ICD-10-CM | POA: Diagnosis not present

## 2015-08-15 ENCOUNTER — Ambulatory Visit
Admission: RE | Admit: 2015-08-15 | Discharge: 2015-08-15 | Disposition: A | Discharge status: Home / Self Care | Payer: No Typology Code available for payment source | Source: Ambulatory Visit | Attending: Radiation Oncology | Admitting: Radiation Oncology

## 2015-08-15 ENCOUNTER — Encounter: Payer: Self-pay | Admitting: Radiation Oncology

## 2015-08-15 VITALS — BP 140/90 | HR 90 | Temp 98.0°F

## 2015-08-15 DIAGNOSIS — C50211 Malignant neoplasm of upper-inner quadrant of right female breast: Secondary | ICD-10-CM

## 2015-08-15 DIAGNOSIS — C50412 Malignant neoplasm of upper-outer quadrant of left female breast: Secondary | ICD-10-CM

## 2015-08-15 DIAGNOSIS — Z510 Encounter for antineoplastic radiation therapy: Secondary | ICD-10-CM | POA: Diagnosis not present

## 2015-08-15 MED ORDER — SONAFINE EX EMUL
1.0000 "application " | Freq: Once | CUTANEOUS | Status: AC
  Administered 2015-08-15: 1 via TOPICAL
  Filled 2015-08-15: qty 45

## 2015-08-15 MED ORDER — OXYCODONE-ACETAMINOPHEN 5-325 MG PO TABS
1.0000 | ORAL_TABLET | ORAL | Status: DC | PRN
Start: 2015-08-15 — End: 2015-08-25

## 2015-08-15 NOTE — Progress Notes (Signed)
Margaret [name] has completed 31 fractions to her left chest wall.  She reports having increased pain since yesterday in her upper mid chest area.  She is rating it an 11/10 today.  It started after cleaning her car off yesterday.  She is taking gabapentin and tramadol without relief.  The skin on her left subclavian area has hyperpigmentation with 2 small areas of desquamation.  The skin on her left chest has hyperpigmentation.  She has been applying cocoa butter to her subclavian area and radiaplex to her left chest.  Advised her to use neosporin on the small open areas and radiaplex on the rest of the treatment area.  BP 140/90 mmHg  Pulse 90  Temp(Src) 98 F (36.7 C) (Oral)

## 2015-08-15 NOTE — Addendum Note (Signed)
Encounter addended by: Ernst Spell, RN on: 08/15/2015  3:26 PM<BR>     Documentation filed: Inpatient MAR

## 2015-08-15 NOTE — Progress Notes (Signed)
  Radiation Oncology         [phone] ________________________________  Name: SHAKEEMA GONYER MRN: [id number]  Date: 08/15/2015  DOB: Mar 05, 1961  Weekly Radiation Therapy Management    ICD-9-CM ICD-10-CM   1. Breast cancer of upper-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) 174.4 C50.412     Current Dose: 55.8 Gy     Planned Dose:  59.4 Gy  Narrative . . . . . . . . The patient presents for routine under treatment assessment.                                   The patient is having worsening of her pain along the left chest wall area. The patient has been taking tramadol as recommended without significant relief. The patient is in tears today.                                 Set-up films were reviewed.                                 The chart was checked. Physical Findings. . .  oral temperature is 98 F (36.7 C). Her blood pressure is 140/90 and her pulse is 90. . Weight essentially stable.  The left chest wall area shows hyperpigmentation changes and some edema. No moist desquamation. The patient is exquisitely tender with palpation in this area Impression . . . . . . . The patient is tolerating radiation. Plan . . . . . . . . . . . . Continue treatment as planned. Prescription for Percocet given to the patient  ________________________________   Blair Promise, PhD, MD

## 2015-08-15 NOTE — Addendum Note (Signed)
Encounter addended by: Wynona Neat, [facility] on: 08/15/2015  3:17 PM<BR>     Documentation filed: Rx Order Verification

## 2015-08-16 ENCOUNTER — Ambulatory Visit
Admission: RE | Admit: 2015-08-16 | Discharge: 2015-08-16 | Disposition: A | Discharge status: Home / Self Care | Payer: No Typology Code available for payment source | Source: Ambulatory Visit | Attending: Radiation Oncology | Admitting: Radiation Oncology

## 2015-08-16 ENCOUNTER — Ambulatory Visit: Payer: No Typology Code available for payment source | Admitting: Radiation Oncology

## 2015-08-16 ENCOUNTER — Ambulatory Visit: Payer: No Typology Code available for payment source

## 2015-08-16 DIAGNOSIS — Z510 Encounter for antineoplastic radiation therapy: Secondary | ICD-10-CM | POA: Diagnosis not present

## 2015-08-17 ENCOUNTER — Ambulatory Visit
Admission: RE | Admit: 2015-08-17 | Discharge: 2015-08-17 | Disposition: A | Discharge status: Home / Self Care | Payer: No Typology Code available for payment source | Source: Ambulatory Visit | Attending: Radiation Oncology | Admitting: Radiation Oncology

## 2015-08-17 ENCOUNTER — Encounter: Payer: Self-pay | Admitting: Radiation Oncology

## 2015-08-17 DIAGNOSIS — Z510 Encounter for antineoplastic radiation therapy: Secondary | ICD-10-CM | POA: Diagnosis not present

## 2015-08-18 ENCOUNTER — Other Ambulatory Visit: Payer: Self-pay | Admitting: *Deleted

## 2015-08-18 DIAGNOSIS — C50412 Malignant neoplasm of upper-outer quadrant of left female breast: Secondary | ICD-10-CM

## 2015-08-19 ENCOUNTER — Other Ambulatory Visit (HOSPITAL_BASED_OUTPATIENT_CLINIC_OR_DEPARTMENT_OTHER): Payer: BLUE CROSS/BLUE SHIELD

## 2015-08-19 ENCOUNTER — Ambulatory Visit (HOSPITAL_BASED_OUTPATIENT_CLINIC_OR_DEPARTMENT_OTHER): Payer: BLUE CROSS/BLUE SHIELD | Admitting: Oncology

## 2015-08-19 ENCOUNTER — Encounter: Payer: Self-pay | Admitting: *Deleted

## 2015-08-19 VITALS — BP 166/94 | HR 79 | Temp 97.9°F | Resp 20 | Wt 284.6 lb

## 2015-08-19 DIAGNOSIS — C50412 Malignant neoplasm of upper-outer quadrant of left female breast: Secondary | ICD-10-CM

## 2015-08-19 DIAGNOSIS — C50411 Malignant neoplasm of upper-outer quadrant of right female breast: Secondary | ICD-10-CM

## 2015-08-19 DIAGNOSIS — C773 Secondary and unspecified malignant neoplasm of axilla and upper limb lymph nodes: Secondary | ICD-10-CM

## 2015-08-19 DIAGNOSIS — C50211 Malignant neoplasm of upper-inner quadrant of right female breast: Secondary | ICD-10-CM

## 2015-08-19 DIAGNOSIS — R079 Chest pain, unspecified: Secondary | ICD-10-CM | POA: Diagnosis not present

## 2015-08-19 DIAGNOSIS — Z720 Tobacco use: Secondary | ICD-10-CM

## 2015-08-19 LAB — COMPREHENSIVE METABOLIC PANEL
ALT: 14 U/L (ref 0–55)
AST: 11 U/L (ref 5–34)
Albumin: 3.5 g/dL (ref 3.5–5.0)
Alkaline Phosphatase: 132 U/L (ref 40–150)
Anion Gap: 10 mEq/L (ref 3–11)
BUN: 16.1 mg/dL (ref 7.0–26.0)
CO2: 24 mEq/L (ref 22–29)
Calcium: 9.3 mg/dL (ref 8.4–10.4)
Chloride: 106 mEq/L (ref 98–109)
Creatinine: 0.9 mg/dL (ref 0.6–1.1)
EGFR: 82 mL/min/{1.73_m2} — ABNORMAL LOW (ref >90)
Glucose: 126 mg/dl (ref 70–140)
Potassium: 3.7 mEq/L (ref 3.5–5.1)
Sodium: 141 mEq/L (ref 136–145)
Total Bilirubin: <0.3 mg/dL (ref 0.20–1.20)
Total Protein: 8.1 g/dL (ref 6.4–8.3)

## 2015-08-19 LAB — CBC WITH DIFFERENTIAL/PLATELET
BASO%: 0.6 % (ref 0.0–2.0)
Basophils Absolute: 0.1 10*3/uL (ref 0.0–0.1)
EOS%: 2.3 % (ref 0.0–7.0)
Eosinophils Absolute: 0.2 10*3/uL (ref 0.0–0.5)
HCT: 41.9 % (ref 34.8–46.6)
HGB: 13.8 g/dL (ref 11.6–15.9)
LYMPH%: 17.3 % (ref 14.0–49.7)
MCH: 28.6 pg (ref 25.1–34.0)
MCHC: 33 g/dL (ref 31.5–36.0)
MCV: 86.7 fL (ref 79.5–101.0)
MONO#: 1 10*3/uL — ABNORMAL HIGH (ref 0.1–0.9)
MONO%: 11.2 % (ref 0.0–14.0)
NEUT#: 6.4 10*3/uL (ref 1.5–6.5)
NEUT%: 68.6 % (ref 38.4–76.8)
Platelets: 214 10*3/uL (ref 145–400)
RBC: 4.84 10*6/uL (ref 3.70–5.45)
RDW: 14.5 % (ref 11.2–14.5)
WBC: 9.3 10*3/uL (ref 3.9–10.3)
lymph#: 1.6 10*3/uL (ref 0.9–3.3)

## 2015-08-19 MED ORDER — VENLAFAXINE HCL ER 75 MG PO CP24: 75.0000 mg | ORAL_CAPSULE | Freq: Every day | ORAL | Status: DC

## 2015-08-19 NOTE — Progress Notes (Signed)
[place]  [phone] [phone]     ID: Margaret [name] DOB: 02-10-1961  MR#: [id number]  RKY#:[id number]  Patient Care Team: No Pcp Per Patient as PCP - General (General Practice) Margaret Messing III, MD as Consulting Physician (General Surgery) Margaret Cruel, MD as Consulting Physician (Oncology) Margaret Pray, MD as Consulting Physician (Radiation Oncology) Margaret Kaufmann, RN as Registered Nurse Margaret Germany, RN as Registered Nurse PCP: No PCP Per Patient GYN: OTHER MD:  CHIEF COMPLAINT: Locally advanced estrogen receptor positive breast cancer; bilateral breast cancer  CURRENT TREATMENT: Adjuvant chemotherapy   BREAST CANCER HISTORY: From the original intake note:  Margaret [name]'s primary care physician retired sometime ago and her health maintenance has not been up-to-date. She has been receiving medical care through the emergency room and was seen in March 2015 following a head injury, then in August 2015 for lancing of an abscess. In December 2015 she noticed that her left breast looked a little bit smaller than her right. She brought this to the attention of friends and family over the next several months but the general feeling was that most people are not perfectly symmetrical. By the summer of this year she noticed that her nipple on the left was "going in". More recently she started developing pain in the left breast. She was evaluated for this in the emergency room 04/09/2015 at which time a left breast exam showed a deformed left breast with a large hard mass encompassing most of the breast, with nipple retraction. There was no nipple discharge or bleeding and no palpable adenopathy.  The patient was referred to [facility] and on 04/13/2015 she underwent bilateral diagnostic mammography with tomosynthesis as well as bilateral breast ultrasound. The breast density was category B. In the right breast at the 11:00 position there was a 1.3 cm irregular  mass which by ultrasonography measured 1.3 cm.--In the left breast there was a 4 cm irregular mass at the 2:00 position associated with nipple retraction. There was a second, 1.7 cm area of architectural distortion at the 9:00 position. Both were palpable. By ultrasonography, the 4 cm irregular mass was noted, with a second mass measuring 2.5 cm by ultrasonography. Both axillae were benign.  On 04/19/2015 the patient underwent right breast upper outer quadrant biopsy, showing ([id number]) and invasive ductal carcinoma, grade 1, estrogen receptor 90% positive, progesterone receptor negative, with an MIB-1 of 5%, and no HER-2 amplification, the signals ratio being 1.26 and the number per cell 2.20.  On the same day, she underwent biopsy of the 2 left breast masses in question as well as a suspicious left axillary lymph node. All 3 biopsies showed invasive ductal carcinoma, grade 2, estrogen receptor 80-100% positive, progesterone receptor 80-100% positive, with the MIB-1 between 5 and 10%, and no HER-2 amplification, the signals ratio being between 1.05 and 1.13, and the number per cell between 2.10 and 2.25.  The patient's subsequent history is as detailed below  INTERVAL HISTORY: Margaret [name] returns today for follow-up of her bilateral breast cancer. She continues on radiation therapy, which she will complete next week. She is here to discuss adjuvant chemotherapy again.  REVIEW OF SYSTEMS: She is tolerating the radiation moderately well although it "knocked her down" some. She still has pain in the chest where she had the surgery, left greater than right, and she takes Percocet for this at times since she found tramadol did not work. The Percocet does not constipate her. She feels very anxious  and feels her mood is "not right". She denies frank depression. It is more irritability. A detailed review of systems today was otherwise stable  PAST MEDICAL HISTORY: Past Medical History  Diagnosis Date  .  Hypertension   . Breast cancer of upper-outer quadrant of left female breast (Margaret [name]) 04/21/2015  . Breast cancer (Margaret [name])   . GERD (gastroesophageal reflux disease)   . Bilateral breast cancer (Margaret [name])     PAST SURGICAL HISTORY: Past Surgical History  Procedure Laterality Date  . Abdominal hysterectomy  1990  . Tubal ligation  1984  . Mastectomy w/ sentinel node biopsy Right   . Mastectomy modified radical Left 05/09/2015  . Mastectomy w/ sentinel node biopsy Right 05/09/2015    Procedure: RIGHT MASTECTOMY WITH RIGHT AXILLARY SENTINEL LYMPH NODE BIOPSY;  Surgeon: Margaret Messing III, MD;  Location: [facility];  Service: General;  Laterality: Right;  . Mastectomy modified radical Left 05/09/2015    Procedure: LEFT MODIFIED RADICAL MASTETCTOMY;  Surgeon: Margaret Messing III, MD;  Location: [facility];  Service: General;  Laterality: Left;    FAMILY HISTORY Family History  Problem Relation Age of Onset  . Hypertension Mother   . Diabetes Mother     the patient has little information on her father. Her mother is living, currently age 76. She has one brother, 2 sisters. There is no history of breast or ovarian cancer in the family to her knowledge.   GYNECOLOGIC HISTORY:  No LMP recorded. Patient has had a hysterectomy.  menarche age 81, first live birth age 77. She is GX P4. She underwent a total abdominal hysterectomy with bilateral salpingo-oophorectomy at age 4. She did not take hormone replacement. She never used oral contraceptives.   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 She is not employed by [employer] but by a Set designer. She is single and lives with her significant other Margaret [name]. He is status post renal transplant. The patient's daughter  Margaret [name] works as a Programmer, applications, as does her daughter  Margaret [name]. Daughter Margaret [name] works at [employer], as a call. All 3 live in [place]. The patient's son Margaret [name]'s  lives in [place]. He prepares sets for shows The patient has 4 grandchildren, no great grandchildren. She attends a [employer] in [place].     ADVANCED DIRECTIVES: Not in place. At the initial clinic visit the patient was given the appropriate forms to complete and notarize at her discretion. The patient intends to name her daughter Sampson Si as healthcare power of attorney. She can be reached at [phone].  HEALTH MAINTENANCE: Social History  Substance Use Topics  . Smoking status: Current Some Day Smoker -- 0.10 packs/day for 28 years    Types: Cigarettes  . Smokeless tobacco: Never Used     Comment: smokes 1 cigarette every other day  . Alcohol Use: No     Colonoscopy: Never  PAP: Status post hysterectomy  Bone density: Never  Lipid panel:  Allergies  Allergen Reactions  . Latex Itching and Other (See Comments)    burning  . 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 Wheat Bran Hives    Current Outpatient Prescriptions  Medication Sig Dispense Refill  . acetaminophen (TYLENOL) 500 MG tablet Take 1,000 mg by mouth every 6 (six) hours as needed for mild pain. Reported on 08/15/2015    . gabapentin (  NEURONTIN) 300 MG capsule Take 1 capsule (300 mg total) by mouth 3 (three) times daily. To control nerve pain 50 capsule 2  . hyaluronate sodium (RADIAPLEXRX) GEL Apply 1 application topically once.    Marland Kitchen ibuprofen (ADVIL,MOTRIN) 200 MG tablet Take 800-1,000 mg by mouth 3 (three) times daily as needed for headache. Reported on 08/15/2015    . losartan (COZAAR) 50 MG tablet Take 50 mg by mouth daily.    . methocarbamol (ROBAXIN) 750 MG tablet Take 1 tablet (750 mg total) by mouth 4 (four) times daily as needed (use for muscle cramps/pain). 30 tablet 2  . non-metallic deodorant (ALRA) MISC Apply 1 application topically daily as needed.    Marland Kitchen omeprazole (PRILOSEC) 20 MG capsule Take 1 capsule (20 mg total) by mouth daily. 60 capsule 1  .  oxyCODONE-acetaminophen (PERCOCET/ROXICET) 5-325 MG tablet Reported on 08/15/2015  0  . oxyCODONE-acetaminophen (PERCOCET/ROXICET) 5-325 MG tablet Take 1 tablet by mouth every 4 (four) hours as needed for severe pain. 30 tablet 0  . promethazine (PHENERGAN) 25 MG tablet TAKE 1 TABLET BY MOUTH EVERY 6 HOURS AS NEEDED FOR NAUSEA AND VOMITING  0  . traMADol (ULTRAM) 50 MG tablet Take 1 tablet (50 mg total) by mouth every 6 (six) hours as needed. 45 tablet 0  . venlafaxine XR (EFFEXOR XR) 75 MG 24 hr capsule Take 1 capsule (75 mg total) by mouth daily with breakfast. 90 capsule 2   No current facility-administered medications for this visit.    OBJECTIVE: Middle-aged African-American woman who appears stated age 66 Vitals:   08/19/15 0929  BP: 166/94  Pulse: 79  Temp: 97.9 F (36.6 C)  Resp: 20     Body mass index is 42.01 kg/(m^2).    ECOG FS:1 - Symptomatic but completely ambulatory  Sclerae unicteric, EOMs intact Oropharynx clear, dentition in good repair No cervical or supraclavicular adenopathy Lungs no rales or rhonchi Heart regular rate and rhythm Abd soft, nontender, positive bowel sounds MSK no focal spinal tenderness, no upper extremity lymphedema Neuro: nonfocal, well oriented, appropriate affect Breasts: s/p bilateral mastectomies. On the left side, where she is still receiving radiation, there is hyperpigmentation but minimal dry desquamation, chiefly inferiorly. The right chest wall is unremarkable. Both axillae are benign.  LAB RESULTS:  CMP     Component Value Date/Time   NA 141 08/19/2015 0845   NA 136 06/29/2015 0923   K 3.7 08/19/2015 0845   K 4.0 06/29/2015 0923   CL 102 06/29/2015 0923   CO2 24 08/19/2015 0845   CO2 24 06/29/2015 0923   GLUCOSE 126 08/19/2015 0845   GLUCOSE 107* 06/29/2015 0923   BUN 16.1 08/19/2015 0845   BUN 7 06/29/2015 0923   CREATININE 0.9 08/19/2015 0845   CREATININE 0.84 06/29/2015 0923   CALCIUM 9.3 08/19/2015 0845   CALCIUM  9.2 06/29/2015 0923   PROT 8.1 08/19/2015 0845   PROT 7.8 06/29/2015 0923   ALBUMIN 3.5 08/19/2015 0845   ALBUMIN 3.5 06/29/2015 0923   AST 11 08/19/2015 0845   AST 20 06/29/2015 0923   ALT 14 08/19/2015 0845   ALT 21 06/29/2015 0923   ALKPHOS 132 08/19/2015 0845   ALKPHOS 87 06/29/2015 0923   BILITOT <0.30 08/19/2015 0845   BILITOT 1.0 06/29/2015 0923   GFRNONAA >60 06/29/2015 0923   GFRAA >60 06/29/2015 0923    INo results found for: SPEP, UPEP  Lab Results  Component Value Date   WBC 9.3 08/19/2015   NEUTROABS 6.4  08/19/2015   HGB 13.8 08/19/2015   HCT 41.9 08/19/2015   MCV 86.7 08/19/2015   PLT 214 08/19/2015      Chemistry      Component Value Date/Time   NA 141 08/19/2015 0845   NA 136 06/29/2015 0923   K 3.7 08/19/2015 0845   K 4.0 06/29/2015 0923   CL 102 06/29/2015 0923   CO2 24 08/19/2015 0845   CO2 24 06/29/2015 0923   BUN 16.1 08/19/2015 0845   BUN 7 06/29/2015 0923   CREATININE 0.9 08/19/2015 0845   CREATININE 0.84 06/29/2015 0923      Component Value Date/Time   CALCIUM 9.3 08/19/2015 0845   CALCIUM 9.2 06/29/2015 0923   ALKPHOS 132 08/19/2015 0845   ALKPHOS 87 06/29/2015 0923   AST 11 08/19/2015 0845   AST 20 06/29/2015 0923   ALT 14 08/19/2015 0845   ALT 21 06/29/2015 0923   BILITOT <0.30 08/19/2015 0845   BILITOT 1.0 06/29/2015 0923       No results found for: LABCA2  No components found for: [id number]  No results for input(s): INR in the last 168 hours.  Urinalysis    Component Value Date/Time   COLORURINE YELLOW 06/29/2015 1030   APPEARANCEUR CLOUDY* 06/29/2015 1030   LABSPEC 1.019 06/29/2015 1030   PHURINE 7.0 06/29/2015 1030   GLUCOSEU NEGATIVE 06/29/2015 1030   HGBUR TRACE* 06/29/2015 1030   BILIRUBINUR NEGATIVE 06/29/2015 1030   KETONESUR NEGATIVE 06/29/2015 1030   PROTEINUR NEGATIVE 06/29/2015 1030   NITRITE NEGATIVE 06/29/2015 1030   LEUKOCYTESUR MODERATE* 06/29/2015 1030    STUDIES: No results  found.   ASSESSMENT: 55 y.o. [place] woman with synchronous breast cancers, as follows  (a) status post right breast upper outer quadrant biopsy 04/19/2015 for a clinical pT1c N0, stage IA invasive ductal carcinoma, grade 1, estrogen receptor positive, progesterone receptor negative, with an MIB-1 of 5%, and no HER-2 amplification  (b) status post left breast biopsy 2 and left axillary lymph node biopsy 04/19/2015 for a clinical T3 N1, stage IIIA invasive ductal carcinoma, grade 1 or 2, estrogen receptor and progesterone receptor positive, HER-2 negative, with an MIB-1 between 5 and 10%  (1) status post bilateral mastectomies 05/09/2015, showing:   (a) on the right side, a pT2 pN0, stage IIA invasive ductal carcinoma, with negative margins and repeat HER-2 again negative    (b) on the left, a pT3 pN2, stage IIIA invasive ductal carcinoma, grade 2, with negative margins, and repeat HER-2 again negative  (2) Mammaprint from the left-sided tumor returned "luminal type, low risk", predicting a small benefit from chemotherapy with the important caveat that N2 disease was not included in the MINDACT study  (3) Oncotype from the right-sided tumor showed a score of 12, predicting an 8% risk of recurrence outside the breast within the next 10 years, if the patient's only systemic therapy was tamoxifen for 5 years. It also predicted no significant benefit from chemotherapy  (4) postmastectomy radiation on the left to be completed 08/16/2014  (5) adjuvant chemotherapy to consist of cyclophosphamide and docetaxel x4 starting early February 2017  (5) 10 years of antiestrogen therapy to follow with consideration of the PALLAS trial  PLAN: Cecilie's situation is complex. She completes local treatment for her breast cancer next week. We generally do chemotherapy before radiation, and there is some data to suggest that delaying adjuvant chemotherapy can make it less effective. We have discussed the pros and  cons in her case extensively and up  to now she has been exceedingly reluctant to receive chemotherapy, chiefly because of work-related issues. Now that she has disability however she is willing to undergo it even though she understands the benefit she may derive from it is limited.  Accordingly we discussed the possible toxicities, side effects and complications of chemotherapy which in her case will consist of cyclophosphamide and docetaxel 4. She will need a port prior to the start of treatment and that is being operationalized. She does not feel she could start before mid-February so her initial treatment date will be 09/19/2015.  We will see her a few days before that treatment to discuss how to take her antinausea and other supportive medications, and to place the appropriate prescriptions. Otherwise once she completes her chemotherapy treatments she will start antiestrogen's.  She has a good understanding of the overall plan. She agrees with it. She knows to call for any problems that may develop before her next visit here.  Margaret Cruel, MD   08/21/2015 3:49 PM Medical Oncology and Hematology [facility] [address] [place], [place] [address] Tel. [phone]    Fax. [phone]

## 2015-08-19 NOTE — Progress Notes (Signed)
[place] Work  Clinical Social Work was referred by patient for assessment of psychosocial needs and further assistance with ss disability application.  Clinical Social Worker met with patient at [facility] in [place] office to offer support and assess for needs.  Pt reports she had an appt with ss disability on 08/15/15, but they were closed due to the snow. Pt reports she has a phone interview scheduled for 09/01/15. CSW provided pt with list of items needed by ss disability to complete application. CSW explained to pt that CSW cannot complete application FOR patient. Pt stated understanding. Pt open to referral to the [facility] for further assistance with application process. Pt has applied for [employer] in [place], but still needs to get them information. CSW reviewed those items with pt today. Pt applied for and received assistance through [place]. CSW encouraged pt to meet with financial advocates, as pt has yet to complete and would qualify. CSW submitted application for [facility] today. Pt to return with needed documentation for CSW to submit in near future for [employer] in [place].   Clinical Social Work interventions: Catering manager and assistance.  [name], [place] Worker [place]  [place] Phone: [phone] Fax: [phone]

## 2015-08-21 NOTE — Progress Notes (Signed)
°  Radiation Oncology         [phone] ________________________________  Name: Margaret [name] MRN: [id number]  Date: 08/17/2015  DOB: 1961/01/05  End of Treatment Note  ICD-9-CM ICD-10-CM     1. Breast cancer of upper-outer quadrant of left female breast ([place]) 174.4 C50.412     DIAGNOSIS: Clincal Stage IIIA (T3, N1, M0) left breast, invasive ductal     Indication for treatment:  Postmastectomy, risk for chest wall recurrence       Radiation treatment dates:   06/27/2015-08/17/2015  Site/dose:   Left chest wall, high axilla and supraclavicular region 45 gray in 25 fractions, mastectomy scar boost 14.4 gray in 8 fractions,  (59.4 Gy)  Narrative: The patient tolerated radiation treatment relatively well.   Patient developed the pain along the chest wall area and fatigue as well as some edema along chestwall area. No moist desquamation at the completion of treatment. Pain treated with tramadol and Percocet  Plan: The patient has completed radiation treatment. The patient will return to radiation oncology clinic for routine followup in one month. I advised them to call or return sooner if they have any questions or concerns related to their recovery or treatment.  -----------------------------------  Blair Promise, PhD, MD

## 2015-08-24 ENCOUNTER — Other Ambulatory Visit: Payer: Self-pay | Admitting: General Surgery

## 2015-08-25 ENCOUNTER — Encounter (HOSPITAL_BASED_OUTPATIENT_CLINIC_OR_DEPARTMENT_OTHER): Payer: Self-pay | Admitting: *Deleted

## 2015-09-01 ENCOUNTER — Encounter (HOSPITAL_BASED_OUTPATIENT_CLINIC_OR_DEPARTMENT_OTHER)
Admission: RE | Disposition: A | Discharge status: Home / Self Care | Payer: Self-pay | Source: Ambulatory Visit | Attending: General Surgery

## 2015-09-01 ENCOUNTER — Ambulatory Visit (HOSPITAL_COMMUNITY): Payer: BLUE CROSS/BLUE SHIELD

## 2015-09-01 ENCOUNTER — Ambulatory Visit (HOSPITAL_BASED_OUTPATIENT_CLINIC_OR_DEPARTMENT_OTHER)
Admission: RE | Admit: 2015-09-01 | Discharge: 2015-09-01 | Disposition: A | Discharge status: Home / Self Care | Payer: BLUE CROSS/BLUE SHIELD | Source: Ambulatory Visit | Attending: General Surgery | Admitting: General Surgery

## 2015-09-01 ENCOUNTER — Ambulatory Visit (HOSPITAL_BASED_OUTPATIENT_CLINIC_OR_DEPARTMENT_OTHER): Payer: BLUE CROSS/BLUE SHIELD | Admitting: Anesthesiology

## 2015-09-01 ENCOUNTER — Encounter (HOSPITAL_BASED_OUTPATIENT_CLINIC_OR_DEPARTMENT_OTHER): Payer: Self-pay | Admitting: Anesthesiology

## 2015-09-01 DIAGNOSIS — C50911 Malignant neoplasm of unspecified site of right female breast: Secondary | ICD-10-CM | POA: Diagnosis not present

## 2015-09-01 DIAGNOSIS — I10 Essential (primary) hypertension: Secondary | ICD-10-CM | POA: Diagnosis not present

## 2015-09-01 DIAGNOSIS — Z79899 Other long term (current) drug therapy: Secondary | ICD-10-CM | POA: Insufficient documentation

## 2015-09-01 DIAGNOSIS — K219 Gastro-esophageal reflux disease without esophagitis: Secondary | ICD-10-CM | POA: Insufficient documentation

## 2015-09-01 DIAGNOSIS — C50919 Malignant neoplasm of unspecified site of unspecified female breast: Secondary | ICD-10-CM

## 2015-09-01 DIAGNOSIS — Z6841 Body Mass Index (BMI) 40.0 and over, adult: Secondary | ICD-10-CM | POA: Diagnosis not present

## 2015-09-01 DIAGNOSIS — C773 Secondary and unspecified malignant neoplasm of axilla and upper limb lymph nodes: Secondary | ICD-10-CM | POA: Diagnosis not present

## 2015-09-01 DIAGNOSIS — Z87891 Personal history of nicotine dependence: Secondary | ICD-10-CM | POA: Insufficient documentation

## 2015-09-01 DIAGNOSIS — C50912 Malignant neoplasm of unspecified site of left female breast: Secondary | ICD-10-CM | POA: Diagnosis present

## 2015-09-01 DIAGNOSIS — Z95828 Presence of other vascular implants and grafts: Secondary | ICD-10-CM

## 2015-09-01 HISTORY — PX: PORTACATH PLACEMENT: SHX2246

## 2015-09-01 SURGERY — INSERTION, TUNNELED CENTRAL VENOUS DEVICE, WITH PORT
Anesthesia: General | Site: Chest | Laterality: Right

## 2015-09-01 MED ORDER — CHLORHEXIDINE GLUCONATE 4 % EX LIQD: 1.0000 "application " | Freq: Once | CUTANEOUS | Status: DC

## 2015-09-01 MED ORDER — MEPERIDINE HCL 25 MG/ML IJ SOLN: 6.2500 mg | INTRAMUSCULAR | Status: DC | PRN

## 2015-09-01 MED ORDER — PROPOFOL 10 MG/ML IV BOLUS
INTRAVENOUS | Status: DC | PRN
  Administered 2015-09-01: 180 mg via INTRAVENOUS

## 2015-09-01 MED ORDER — ONDANSETRON HCL 4 MG/2ML IJ SOLN
INTRAMUSCULAR | Status: DC | PRN
  Administered 2015-09-01: 4 mg via INTRAVENOUS

## 2015-09-01 MED ORDER — HEPARIN SOD (PORK) LOCK FLUSH 100 UNIT/ML IV SOLN
INTRAVENOUS | Status: DC | PRN
  Administered 2015-09-01: 500 [IU]

## 2015-09-01 MED ORDER — FENTANYL CITRATE (PF) 100 MCG/2ML IJ SOLN
25.0000 ug | INTRAMUSCULAR | Status: DC | PRN
  Administered 2015-09-01 (×2): 50 ug via INTRAVENOUS

## 2015-09-01 MED ORDER — FENTANYL CITRATE (PF) 100 MCG/2ML IJ SOLN
INTRAMUSCULAR | Status: AC
  Filled 2015-09-01: qty 2

## 2015-09-01 MED ORDER — SCOPOLAMINE 1 MG/3DAYS TD PT72: 1.0000 | MEDICATED_PATCH | Freq: Once | TRANSDERMAL | Status: DC | PRN

## 2015-09-01 MED ORDER — PANTOPRAZOLE SODIUM 40 MG IV SOLR
40.0000 mg | Freq: Once | INTRAVENOUS | Status: AC
  Administered 2015-09-01: 40 mg via INTRAVENOUS

## 2015-09-01 MED ORDER — GLYCOPYRROLATE 0.2 MG/ML IJ SOLN
0.2000 mg | Freq: Once | INTRAMUSCULAR | Status: AC | PRN
  Administered 2015-09-01: 0.2 mg via INTRAVENOUS

## 2015-09-01 MED ORDER — MIDAZOLAM HCL 2 MG/2ML IJ SOLN
1.0000 mg | INTRAMUSCULAR | Status: DC | PRN
  Administered 2015-09-01: 2 mg via INTRAVENOUS

## 2015-09-01 MED ORDER — BUPIVACAINE HCL (PF) 0.25 % IJ SOLN
INTRAMUSCULAR | Status: AC
  Filled 2015-09-01: qty 30

## 2015-09-01 MED ORDER — OXYCODONE HCL 5 MG PO TABS
ORAL_TABLET | ORAL | Status: AC
  Filled 2015-09-01: qty 1

## 2015-09-01 MED ORDER — PANTOPRAZOLE SODIUM 20 MG PO TBEC: 20.0000 mg | DELAYED_RELEASE_TABLET | Freq: Every day | ORAL | Status: DC

## 2015-09-01 MED ORDER — HEPARIN (PORCINE) IN NACL 2-0.9 UNIT/ML-% IJ SOLN
INTRAMUSCULAR | Status: DC | PRN
Start: 2015-09-01 — End: 2015-09-01
  Administered 2015-09-01: 1 via INTRAVENOUS

## 2015-09-01 MED ORDER — ATROPINE SULFATE 0.4 MG/ML IJ SOLN
INTRAMUSCULAR | Status: AC
  Filled 2015-09-01: qty 1

## 2015-09-01 MED ORDER — OXYCODONE-ACETAMINOPHEN 5-325 MG PO TABS: 1.0000 | ORAL_TABLET | ORAL | Status: DC | PRN

## 2015-09-01 MED ORDER — EPHEDRINE SULFATE 50 MG/ML IJ SOLN
INTRAMUSCULAR | Status: DC | PRN
  Administered 2015-09-01: 15 mg via INTRAVENOUS

## 2015-09-01 MED ORDER — ONDANSETRON HCL 4 MG/2ML IJ SOLN
INTRAMUSCULAR | Status: AC
  Filled 2015-09-01: qty 2

## 2015-09-01 MED ORDER — PHENYLEPHRINE 40 MCG/ML (10ML) SYRINGE FOR IV PUSH (FOR BLOOD PRESSURE SUPPORT)
PREFILLED_SYRINGE | INTRAVENOUS | Status: AC
  Filled 2015-09-01: qty 10

## 2015-09-01 MED ORDER — PROPOFOL 500 MG/50ML IV EMUL
INTRAVENOUS | Status: AC
  Filled 2015-09-01: qty 50

## 2015-09-01 MED ORDER — OXYCODONE HCL 5 MG PO TABS
5.0000 mg | ORAL_TABLET | ORAL | Status: DC | PRN
  Administered 2015-09-01: 5 mg via ORAL

## 2015-09-01 MED ORDER — EPHEDRINE SULFATE 50 MG/ML IJ SOLN
INTRAMUSCULAR | Status: AC
  Filled 2015-09-01: qty 1

## 2015-09-01 MED ORDER — LIDOCAINE HCL (CARDIAC) 20 MG/ML IV SOLN
INTRAVENOUS | Status: AC
  Filled 2015-09-01: qty 5

## 2015-09-01 MED ORDER — LIDOCAINE HCL (CARDIAC) 20 MG/ML IV SOLN
INTRAVENOUS | Status: DC | PRN
  Administered 2015-09-01: 50 mg via INTRAVENOUS

## 2015-09-01 MED ORDER — FENTANYL CITRATE (PF) 100 MCG/2ML IJ SOLN
INTRAMUSCULAR | Status: AC
Start: 2015-09-01 — End: 2015-09-01
  Filled 2015-09-01: qty 2

## 2015-09-01 MED ORDER — HEPARIN SOD (PORK) LOCK FLUSH 100 UNIT/ML IV SOLN
INTRAVENOUS | Status: AC
  Filled 2015-09-01: qty 5

## 2015-09-01 MED ORDER — CEFAZOLIN SODIUM-DEXTROSE 2-3 GM-% IV SOLR
INTRAVENOUS | Status: AC
  Filled 2015-09-01: qty 50

## 2015-09-01 MED ORDER — METOCLOPRAMIDE HCL 5 MG/ML IJ SOLN: 10.0000 mg | Freq: Once | INTRAMUSCULAR | Status: DC | PRN

## 2015-09-01 MED ORDER — SUCCINYLCHOLINE CHLORIDE 20 MG/ML IJ SOLN
INTRAMUSCULAR | Status: AC
  Filled 2015-09-01: qty 1

## 2015-09-01 MED ORDER — MIDAZOLAM HCL 2 MG/2ML IJ SOLN
INTRAMUSCULAR | Status: AC
  Filled 2015-09-01: qty 2

## 2015-09-01 MED ORDER — DEXAMETHASONE SODIUM PHOSPHATE 4 MG/ML IJ SOLN
INTRAMUSCULAR | Status: DC | PRN
  Administered 2015-09-01: 10 mg via INTRAVENOUS

## 2015-09-01 MED ORDER — DEXAMETHASONE SODIUM PHOSPHATE 10 MG/ML IJ SOLN
INTRAMUSCULAR | Status: AC
  Filled 2015-09-01: qty 1

## 2015-09-01 MED ORDER — HEPARIN (PORCINE) IN NACL 2-0.9 UNIT/ML-% IJ SOLN
INTRAMUSCULAR | Status: AC
  Filled 2015-09-01: qty 500

## 2015-09-01 MED ORDER — BUPIVACAINE HCL (PF) 0.25 % IJ SOLN
INTRAMUSCULAR | Status: DC | PRN
  Administered 2015-09-01: 5 mL

## 2015-09-01 MED ORDER — CEFAZOLIN SODIUM-DEXTROSE 2-3 GM-% IV SOLR
2.0000 g | INTRAVENOUS | Status: AC
  Administered 2015-09-01: 2 g via INTRAVENOUS

## 2015-09-01 MED ORDER — CEFAZOLIN SODIUM 1-5 GM-% IV SOLN
INTRAVENOUS | Status: AC
  Filled 2015-09-01: qty 50

## 2015-09-01 MED ORDER — LACTATED RINGERS IV SOLN
INTRAVENOUS | Status: DC
  Administered 2015-09-01: 07:00:00 via INTRAVENOUS

## 2015-09-01 MED ORDER — PANTOPRAZOLE SODIUM 40 MG IV SOLR
INTRAVENOUS | Status: AC
  Filled 2015-09-01: qty 40

## 2015-09-01 MED ORDER — SODIUM CHLORIDE 0.9 % IJ SOLN
INTRAMUSCULAR | Status: AC
  Filled 2015-09-01: qty 40

## 2015-09-01 MED ORDER — FENTANYL CITRATE (PF) 100 MCG/2ML IJ SOLN
50.0000 ug | INTRAMUSCULAR | Status: DC | PRN
  Administered 2015-09-01: 100 ug via INTRAVENOUS

## 2015-09-01 SURGICAL SUPPLY — 50 items
BAG DECANTER FOR FLEXI CONT (MISCELLANEOUS) ×2 IMPLANT
BLADE SURG 15 STRL LF DISP TIS (BLADE) ×1 IMPLANT
BLADE SURG 15 STRL SS (BLADE) ×2
CANISTER SUCT 1200ML W/VALVE (MISCELLANEOUS) IMPLANT
CHLORAPREP W/TINT 26ML (MISCELLANEOUS) ×2 IMPLANT
CLEANER CAUTERY TIP 5X5 PAD (MISCELLANEOUS) ×1 IMPLANT
COVER BACK TABLE 60X90IN (DRAPES) ×2 IMPLANT
COVER MAYO STAND STRL (DRAPES) ×2 IMPLANT
DECANTER SPIKE VIAL GLASS SM (MISCELLANEOUS) IMPLANT
DRAPE C-ARM 42X72 X-RAY (DRAPES) ×2 IMPLANT
DRAPE LAPAROSCOPIC ABDOMINAL (DRAPES) ×2 IMPLANT
DRAPE UTILITY XL STRL (DRAPES) ×2 IMPLANT
ELECT REM PT RETURN 9FT ADLT (ELECTROSURGICAL) ×2
ELECTRODE REM PT RTRN 9FT ADLT (ELECTROSURGICAL) ×1 IMPLANT
GLOVE BIO SURGEON STRL SZ7.5 (GLOVE) ×2 IMPLANT
GLOVE BIOGEL PI IND STRL 7.0 (GLOVE) IMPLANT
GLOVE BIOGEL PI IND STRL 7.5 (GLOVE) IMPLANT
GLOVE BIOGEL PI INDICATOR 7.0 (GLOVE) ×1
GLOVE BIOGEL PI INDICATOR 7.5 (GLOVE) ×2
GLOVE SURG SS PI 7.0 STRL IVOR (GLOVE) ×2 IMPLANT
GLOVE SURG SS PI 7.5 STRL IVOR (GLOVE) ×1 IMPLANT
GOWN STRL REUS W/ TWL LRG LVL3 (GOWN DISPOSABLE) ×2 IMPLANT
GOWN STRL REUS W/TWL LRG LVL3 (GOWN DISPOSABLE) ×6
IV KIT MINILOC 20X1 SAFETY (NEEDLE) IMPLANT
KIT PORT POWER 8FR ISP CVUE (Catheter) ×1 IMPLANT
LIQUID BAND (GAUZE/BANDAGES/DRESSINGS) ×2 IMPLANT
NDL HYPO 25X1 1.5 SAFETY (NEEDLE) ×1 IMPLANT
NDL SAFETY ECLIPSE 18X1.5 (NEEDLE) IMPLANT
NDL SPNL 22GX3.5 QUINCKE BK (NEEDLE) IMPLANT
NEEDLE HYPO 18GX1.5 SHARP (NEEDLE)
NEEDLE HYPO 22GX1.5 SAFETY (NEEDLE) IMPLANT
NEEDLE HYPO 25X1 1.5 SAFETY (NEEDLE) ×2 IMPLANT
NEEDLE SPNL 22GX3.5 QUINCKE BK (NEEDLE) IMPLANT
PACK BASIN DAY SURGERY FS (CUSTOM PROCEDURE TRAY) ×2 IMPLANT
PAD CLEANER CAUTERY TIP 5X5 (MISCELLANEOUS) ×1
PENCIL BUTTON HOLSTER BLD 10FT (ELECTRODE) ×2 IMPLANT
SLEEVE SCD COMPRESS KNEE MED (MISCELLANEOUS) ×1 IMPLANT
SUT MON AB 4-0 PC3 18 (SUTURE) ×2 IMPLANT
SUT PROLENE 2 0 SH DA (SUTURE) ×2 IMPLANT
SUT SILK 2 0 TIES 17X18 (SUTURE)
SUT SILK 2-0 18XBRD TIE BLK (SUTURE) IMPLANT
SUT VIC AB 3-0 SH 27 (SUTURE) ×2
SUT VIC AB 3-0 SH 27X BRD (SUTURE) ×1 IMPLANT
SYR 5ML LL (SYRINGE) ×2 IMPLANT
SYR CONTROL 10ML LL (SYRINGE) ×3 IMPLANT
SYRINGE 10CC LL (SYRINGE) IMPLANT
TOWEL OR 17X24 6PK STRL BLUE (TOWEL DISPOSABLE) ×3 IMPLANT
TOWEL OR NON WOVEN STRL DISP B (DISPOSABLE) ×2 IMPLANT
TUBE CONNECTING 20X1/4 (TUBING) IMPLANT
YANKAUER SUCT BULB TIP NO VENT (SUCTIONS) IMPLANT

## 2015-09-01 NOTE — Interval H&P Note (Signed)
History and Physical Interval Note:  09/01/2015 8:32 AM  Margaret [name]  has presented today for surgery, with the diagnosis of bilateral breast cancer with positive left axillary lymph node  The various methods of treatment have been discussed with the patient and family. After consideration of risks, benefits and other options for treatment, the patient has consented to  Procedure(s): INSERTION PORT-A-CATH (N/A) as a surgical intervention .  The patient's history has been reviewed, patient examined, no change in status, stable for surgery.  I have reviewed the patient's chart and labs.  Questions were answered to the patient's satisfaction.     TOTH III,Kasey Ewings S

## 2015-09-01 NOTE — Op Note (Signed)
09/01/2015  9:43 AM  PATIENT:  Margaret [name]  55 y.o. female  PRE-OPERATIVE DIAGNOSIS:  bilateral breast cancer with positive left axillary lymph node  POST-OPERATIVE DIAGNOSIS:  bilateral breast cancer with positive left axillary lymph node  PROCEDURE:  Procedure(s): INSERTION PORT-A-CATH (Right)  SURGEON:  Surgeon(s) and Role:    * Jovita Kussmaul, MD - Primary  PHYSICIAN ASSISTANT:   ASSISTANTS: none   ANESTHESIA:   general  EBL:  Total I/O In: 250 [I.V.:250] Out: -   BLOOD ADMINISTERED:none  DRAINS: none   LOCAL MEDICATIONS USED:  MARCAINE     SPECIMEN:  No Specimen  DISPOSITION OF SPECIMEN:  N/A  COUNTS:  YES  TOURNIQUET:  * No tourniquets in log *  DICTATION: .Dragon Dictation   After informed consent was obtained the patient was brought to the operating room and placed in the supine position on the operating table. After adequate induction of general anesthesia the patient's right chest and neck area were prepped with ChloraPrep, allowed to dry, and draped in usual sterile manner. The patient was placed in Trendelenburg position. The area lateral to the bend of the clavicle on the right chest was infiltrated with quarter percent Marcaine. A small incision was made with a 15 blade knife just lateral to the bend of the clavicle on the right chest. A large bore needle from the Port-A-Cath kit was used to slide beneath the bend of the clavicle heading towards the sternal notch and in doing so we were able to access the right subclavian vein. A wire was fed through the needle using the Seldinger technique without difficulty. The wire was confirmed in the central venous system using real-time fluoroscopy. Next the incision on the right chest was extended slightly. A subcutaneous pocket was created inferior to the incision by blunt finger dissection. Next the tubing was placed on the reservoir. The reservoir was placed in the pocket and the length of the tubing was estimated  using real-time fluoroscopy. The tubing was then cut to the appropriate length. Next a sheath and dilator were fed over the wire using the Seldinger technique without difficulty. The dilator and wire were removed. The tubing was fed through the sheath as far as it would go and held in place while the sheath was gently cracked and separated. A real-time fluoroscopy image showed the tip of the catheter to be in the distal superior vena cava. The tubing was then permanently anchored to the reservoir. The reservoir was anchored in the pocket with 2 2-0 Prolene stitches. The port was aspirated and it aspirated blood easily. The port was then flushed initially with a dilute heparin solution and then with a more concentrated heparin solution. The subcutaneous tissue was then closed over the port with interrupted 3-0 Vicryl stitches. The skin was then closed with a running 4-0 Monocryl subcuticular stitch. Dermabond dressings were applied. The patient tolerated the procedure well. At the end of the case all needle sponge and instrument counts were correct. The patient was then awakened and taken to recovery in stable condition.  PLAN OF CARE: Discharge to home after PACU  PATIENT DISPOSITION:  PACU - hemodynamically stable.   Delay start of Pharmacological VTE agent (>24hrs) due to surgical blood loss or risk of bleeding: not applicable

## 2015-09-01 NOTE — Anesthesia Postprocedure Evaluation (Signed)
Anesthesia Post Note  Patient: Margaret [name]  Procedure(s) Performed: Procedure(s) (LRB): INSERTION PORT-A-CATH (Right)  Patient location during evaluation: PACU Anesthesia Type: General Level of consciousness: awake and alert and oriented Pain management: pain level controlled Vital Signs Assessment: post-procedure vital signs reviewed and stable Respiratory status: spontaneous breathing, nonlabored ventilation and respiratory function stable Cardiovascular status: blood pressure returned to baseline and stable Postop Assessment: no signs of nausea or vomiting Anesthetic complications: no    Last Vitals:  Filed Vitals:   09/01/15 1015 09/01/15 1030  BP: 131/89   Pulse: 96 103  Temp:    Resp: 18 19    Last Pain:  Filed Vitals:   09/01/15 1032  PainSc: 7                  Jeffery Gammell A.

## 2015-09-01 NOTE — Anesthesia Procedure Notes (Signed)
Procedure Name: LMA Insertion Date/Time: 09/01/2015 8:50 AM Performed by: Melynda Ripple D Pre-anesthesia Checklist: Patient identified, Emergency Drugs available, Suction available and Patient being monitored Patient Re-evaluated:Patient Re-evaluated prior to inductionOxygen Delivery Method: Circle System Utilized Preoxygenation: Pre-oxygenation with 100% oxygen Intubation Type: IV induction Ventilation: Mask ventilation without difficulty LMA: LMA with gastric port inserted LMA Size: 4.0 Number of attempts: 1 Placement Confirmation: positive ETCO2 Tube secured with: Tape Dental Injury: Teeth and Oropharynx as per pre-operative assessment

## 2015-09-01 NOTE — H&P (Signed)
  Margaret [name]  Location: [facility] Surgery Patient #: [id number] DOB: 01-30-1961 Single / Language: Cleophus Molt / Race: Black or African American Female   History of Present Illness  Patient words: reck.  The patient is a 55 year old female who presents for a follow-up for Breast cancer. The patient is a 55 year old black female who is about 6 weeks status post left modified radical mastectomy for a T3 N2 a left breast cancer and right mastectomy and sentinel node mapping for a T2 N0 right breast cancer. Both cancers were ER/PR positive and HER-2 negative with a Ki-67 of 10%. She had her last drain removed last week and tolerated it well. Since that time she has also started radiation therapy. She continues to complain of pain along the chest wall bilaterally but especially on the left with pain under the left arm. This is likely from her node dissection.   Allergies  No Known Drug Allergies10/07/2015  Medication History  Gabapentin (300MG Capsule, 1 (one) Capsule Oral three times a day, Taken starting 06/02/2015) Active. Losartan Potassium (50MG Tablet, Oral) Active. Methocarbamol (750MG Tablet, Oral) Active. Omeprazole (20MG Capsule DR, Oral) Active. Medications Reconciled  Vitals  Weight: 281 lb Height: 69in Body Surface Area: 2.39 m Body Mass Index: 41.5 kg/m  Temp.: 70F(Temporal)  Pulse: 74 (Regular)  BP: 126/70 (Sitting, Left Arm, Standard)       Physical Exam  Breast Note: Both mastectomy incisions are healing nicely with no sign of infection or seroma. The skin flaps are healthy. There is no palpable mass of either chest wall. There is no palpable axillary, supraclavicular, or cervical lymphadenopathy     Assessment & Plan  BILATERAL MALIGNANT NEOPLASM OF CENTRAL PORTION OF BREAST IN FEMALE (C50.111) Impression: The patient is 6 weeks status post bilateral mastectomies for bilateral breast cancer. She is doing well with no residual  seroma. She has started radiation therapy. I will refill her pain prescription today and plan to see her back in about 2 months. Current Plans Follow up in 2 months or as needed   Signed by Luella Cook, MD

## 2015-09-01 NOTE — Transfer of Care (Signed)
Immediate Anesthesia Transfer of Care Note  Patient: Margaret [name]  Procedure(s) Performed: Procedure(s): INSERTION PORT-A-CATH (Right)  Patient Location: PACU  Anesthesia Type:General  Level of Consciousness: awake, alert  and oriented  Airway & Oxygen Therapy: Patient Spontanous Breathing and Patient connected to face mask oxygen  Post-op Assessment: Report given to RN and Post -op Vital signs reviewed and stable  Post vital signs: Reviewed and stable  Last Vitals:  Filed Vitals:   09/01/15 0715  BP: 132/78  Pulse: 83  Temp: 36.7 C  Resp: 20    Complications: No apparent anesthesia complications

## 2015-09-01 NOTE — Discharge Instructions (Signed)

## 2015-09-01 NOTE — Anesthesia Preprocedure Evaluation (Signed)
Anesthesia Evaluation  Patient identified by MRN, date of birth, ID band Patient awake    Reviewed: Allergy & Precautions, NPO status , Patient's Chart, lab work & pertinent test results  Airway Mallampati: I  TM Distance: >3 FB Neck ROM: Full    Dental no notable dental hx. (+) Teeth Intact   Pulmonary former smoker,    Pulmonary exam normal breath sounds clear to auscultation       Cardiovascular hypertension, Pt. on medications Normal cardiovascular exam Rhythm:Regular Rate:Normal     Neuro/Psych negative neurological ROS  negative psych ROS   GI/Hepatic Neg liver ROS, GERD  Medicated and Controlled,  Endo/Other  Morbid obesity  Renal/GU negative Renal ROS  negative genitourinary   Musculoskeletal negative musculoskeletal ROS (+)   Abdominal (+) + obese,   Peds  Hematology negative hematology ROS (+)   Anesthesia Other Findings   Reproductive/Obstetrics negative OB ROS                             Anesthesia Physical Anesthesia Plan  ASA: III  Anesthesia Plan: General   Post-op Pain Management:    Induction: Intravenous  Airway Management Planned: LMA  Additional Equipment:   Intra-op Plan:   Post-operative Plan: Extubation in OR  Informed Consent: I have reviewed the patients History and Physical, chart, labs and discussed the procedure including the risks, benefits and alternatives for the proposed anesthesia with the patient or authorized representative who has indicated his/her understanding and acceptance.   Dental advisory given  Plan Discussed with: CRNA, Anesthesiologist and Surgeon  Anesthesia Plan Comments:         Anesthesia Quick Evaluation

## 2015-09-02 ENCOUNTER — Encounter (HOSPITAL_BASED_OUTPATIENT_CLINIC_OR_DEPARTMENT_OTHER): Payer: Self-pay | Admitting: General Surgery

## 2015-09-02 NOTE — Addendum Note (Signed)
Addendum  created 09/02/15 1307 by Tawni Millers, CRNA   Modules edited: Charges VN

## 2015-09-13 ENCOUNTER — Telehealth: Payer: Self-pay | Admitting: Nurse Practitioner

## 2015-09-13 NOTE — Telephone Encounter (Signed)
Pt called to confirm updated apt, confirmed this Thursday at 1:45... KJ

## 2015-09-15 ENCOUNTER — Telehealth: Payer: Self-pay | Admitting: Oncology

## 2015-09-15 ENCOUNTER — Ambulatory Visit (HOSPITAL_BASED_OUTPATIENT_CLINIC_OR_DEPARTMENT_OTHER): Payer: BLUE CROSS/BLUE SHIELD | Admitting: Nurse Practitioner

## 2015-09-15 ENCOUNTER — Other Ambulatory Visit: Payer: Self-pay | Admitting: Nurse Practitioner

## 2015-09-15 ENCOUNTER — Encounter: Payer: Self-pay | Admitting: Nurse Practitioner

## 2015-09-15 ENCOUNTER — Encounter: Payer: Self-pay | Admitting: *Deleted

## 2015-09-15 VITALS — BP 172/116 | HR 106 | Temp 98.5°F | Resp 20 | Wt 279.6 lb

## 2015-09-15 DIAGNOSIS — C50211 Malignant neoplasm of upper-inner quadrant of right female breast: Secondary | ICD-10-CM

## 2015-09-15 DIAGNOSIS — C50412 Malignant neoplasm of upper-outer quadrant of left female breast: Secondary | ICD-10-CM

## 2015-09-15 DIAGNOSIS — R51 Headache: Secondary | ICD-10-CM | POA: Diagnosis not present

## 2015-09-15 DIAGNOSIS — C773 Secondary and unspecified malignant neoplasm of axilla and upper limb lymph nodes: Secondary | ICD-10-CM

## 2015-09-15 DIAGNOSIS — Z23 Encounter for immunization: Secondary | ICD-10-CM

## 2015-09-15 DIAGNOSIS — C50411 Malignant neoplasm of upper-outer quadrant of right female breast: Secondary | ICD-10-CM

## 2015-09-15 DIAGNOSIS — I10 Essential (primary) hypertension: Secondary | ICD-10-CM

## 2015-09-15 DIAGNOSIS — R5383 Other fatigue: Secondary | ICD-10-CM

## 2015-09-15 MED ORDER — DEXAMETHASONE 4 MG PO TABS: 8.0000 mg | ORAL_TABLET | Freq: Two times a day (BID) | ORAL | Status: DC

## 2015-09-15 MED ORDER — LORAZEPAM 0.5 MG PO TABS: 0.5000 mg | ORAL_TABLET | Freq: Every evening | ORAL | Status: DC | PRN

## 2015-09-15 MED ORDER — LIDOCAINE-PRILOCAINE 2.5-2.5 % EX CREA: TOPICAL_CREAM | CUTANEOUS | Status: DC

## 2015-09-15 MED ORDER — ONDANSETRON HCL 8 MG PO TABS: 8.0000 mg | ORAL_TABLET | Freq: Two times a day (BID) | ORAL | Status: DC

## 2015-09-15 MED ORDER — PROCHLORPERAZINE MALEATE 10 MG PO TABS: 10.0000 mg | ORAL_TABLET | Freq: Four times a day (QID) | ORAL | Status: DC | PRN

## 2015-09-15 MED ORDER — INFLUENZA VAC SPLIT QUAD 0.5 ML IM SUSY
0.5000 mL | PREFILLED_SYRINGE | Freq: Once | INTRAMUSCULAR | Status: AC
  Administered 2015-09-15: 0.5 mL via INTRAMUSCULAR
  Filled 2015-09-15: qty 0.5

## 2015-09-15 MED ORDER — HYDROCHLOROTHIAZIDE 12.5 MG PO TABS: 12.5000 mg | ORAL_TABLET | Freq: Every day | ORAL | Status: DC

## 2015-09-15 NOTE — Progress Notes (Addendum)
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 She has been receiving medical care through the emergency room and was seen in March 2015 following a head injury, then in August 2015 for lancing of an abscess. In December 2015 she noticed that her left breast looked a little bit smaller than her right. She brought this to the attention of friends and family over the next several months but the general feeling was that most people are not perfectly symmetrical. By the summer of this year she noticed that her nipple on the left was "going in". More recently she started developing pain in the left breast. She was evaluated for this in the emergency room 04/09/2015 at which time a left breast exam showed a deformed left breast with a large hard mass encompassing most of the breast, with nipple retraction. There was no nipple discharge or bleeding and no palpable adenopathy.  The patient was referred to [facility] and on 04/13/2015 she underwent bilateral diagnostic mammography with tomosynthesis as well as bilateral breast ultrasound. The breast density was category B. In the right breast at the 11:00 position there was a 1.3 cm irregular  mass which by ultrasonography measured 1.3 cm.--In the left breast there was a 4 cm irregular mass at the 2:00 position associated with nipple retraction. There was a second, 1.7 cm area of architectural distortion at the 9:00 position. Both were palpable. By ultrasonography, the 4 cm irregular mass was noted, with a second mass measuring 2.5 cm by ultrasonography. Both axillae were benign.  On 04/19/2015 the patient underwent right breast upper outer quadrant biopsy, showing ([id number]) and invasive ductal carcinoma, grade 1, estrogen receptor 90% positive, progesterone receptor negative, with an MIB-1 of 5%, and no HER-2 amplification, the signals ratio being 1.26 and the number per cell 2.20.  On the same day, she underwent biopsy of the 2 left breast masses in question as well as a suspicious left axillary lymph node. All 3 biopsies showed invasive ductal carcinoma, grade 2, estrogen receptor 80-100% positive, progesterone receptor 80-100% positive, with the MIB-1 between 5 and 10%, and no HER-2 amplification, the signals ratio being between 1.05 and 1.13, and the number per cell between 2.10 and 2.25.  The patient's subsequent history is as detailed below  INTERVAL HISTORY: Margaret [name] returns today for follow-up of her bilateral breast cancer. She is here to discuss her antimetic regimen for cyclophosphamide and docetaxel, which she is expected to begin this upcoming Monday. She had a port placed 2 weeks ago and this is healing well.   REVIEW OF SYSTEMS: Margaret [name] complains of frequent headaches. She has one today, and her blood pressure is in the 170s/100s. She takes cozaar daily but has no PCP. She takes 35m gabapentin TID for nerve pain to her left breast. She  is no longer taking percocet for pain. She is anxious today, and in general when she comes to this clinic. She denies fevers, chills, nausea, vomiting, or changes in bowel or bladder habits.   PAST MEDICAL HISTORY: Past Medical History    Diagnosis Date  . Hypertension   . Breast cancer of upper-outer quadrant of left female breast ([place]) 04/21/2015  . Breast cancer ([place])   . GERD (gastroesophageal reflux disease)   . Bilateral breast cancer ([place])     PAST SURGICAL HISTORY: Past Surgical History  Procedure Laterality Date  . Abdominal hysterectomy  1990  . Tubal ligation  1984  . Mastectomy w/ sentinel node biopsy Right   . Mastectomy modified radical Left 05/09/2015  . Mastectomy w/ sentinel node biopsy Right 05/09/2015    Procedure: RIGHT MASTECTOMY WITH RIGHT AXILLARY SENTINEL LYMPH NODE BIOPSY;  Surgeon: Margaret Messing III, MD;  Location: [facility];  Service: General;  Laterality: Right;  . Mastectomy modified radical Left 05/09/2015    Procedure: LEFT MODIFIED RADICAL MASTETCTOMY;  Surgeon: Margaret Messing III, MD;  Location: [facility];  Service: General;  Laterality: Left;  . Portacath placement Right 09/01/2015    Procedure: INSERTION PORT-A-CATH;  Surgeon: Margaret Messing III, MD;  Location: [facility];  Service: General;  Laterality: Right;    FAMILY HISTORY Family History  Problem Relation Age of Onset  . Hypertension Mother   . Diabetes Mother     the patient has little information on her father. Her mother is living, currently age 72. She has one brother, 2 sisters. There is no history of breast or ovarian cancer in the family to her knowledge.   GYNECOLOGIC HISTORY:  No LMP recorded. Patient has had a hysterectomy.  menarche age 63, first live birth age 88. She is GX P4. She underwent a total abdominal hysterectomy with bilateral salpingo-oophorectomy at age 56. She did not take hormone replacement. She never used oral contraceptives.   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 She is not employed by [employer] but by a Set designer. She is single and lives with her significant other Margaret [name]. He is status post renal transplant. The patient's daughter   Margaret [name] works as a Programmer, applications, as does her daughter  Margaret [name]. Daughter Margaret [name] works at [employer], as a call. All 3 live in [place]. The patient's son Marc Morgans Westermeyer's lives in [place]. He prepares sets for shows The patient has 4 grandchildren, no great grandchildren. She attends a [employer] in [place].     ADVANCED DIRECTIVES: Not in place. At the initial clinic visit the patient was given the appropriate forms to complete and notarize at her discretion. The patient intends to name her daughter Sampson Si as healthcare power of attorney. She can be reached at [phone].  HEALTH MAINTENANCE: Social History  Substance Use Topics  . Smoking status: Former Smoker -- 0.10 packs/day for 28 years  . Smokeless tobacco: Never Used  . Alcohol Use: No     Colonoscopy: Never  PAP: Status post hysterectomy  Bone density: Never  Lipid panel:  Allergies  Allergen Reactions  . Latex Itching and Other (See Comments)    burning  . 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 Wheat Bran Hives    Current Outpatient Prescriptions  Medication Sig Dispense Refill  . acetaminophen (TYLENOL) 500 MG  tablet Take 1,000 mg by mouth every 6 (six) hours as needed for mild pain. Reported on 08/15/2015    . gabapentin (NEURONTIN) 300 MG capsule Take 1 capsule (300 mg total) by mouth 3 (three) times daily. To control nerve pain 50 capsule 2  . losartan (COZAAR) 50 MG tablet Take 50 mg by mouth daily.    . methocarbamol (ROBAXIN) 750 MG tablet Take 1 tablet (750 mg total) by mouth 4 (four) times daily as needed (use for muscle cramps/pain). 30 tablet 2  . pantoprazole (PROTONIX) 20 MG tablet Take 1 tablet (20 mg total) by mouth daily. 30 tablet 0  . venlafaxine XR (EFFEXOR XR) 75 MG 24 hr capsule Take 1 capsule (75 mg total) by mouth daily with breakfast. 90 capsule 2  . dexamethasone (DECADRON) 4 MG tablet Take 2 tablets (8 mg total)  by mouth 2 (two) times daily. Start the day before Taxotere. Then again the day after chemo for 3 days. 30 tablet 1  . hydrochlorothiazide (HYDRODIURIL) 12.5 MG tablet Take 1 tablet (12.5 mg total) by mouth daily. 30 tablet 3  . ibuprofen (ADVIL,MOTRIN) 200 MG tablet Take 800-1,000 mg by mouth 3 (three) times daily as needed for headache. Reported on 09/15/2015    . lidocaine-prilocaine (EMLA) cream Apply to affected area once 30 g 3  . LORazepam (ATIVAN) 0.5 MG tablet Take 1 tablet (0.5 mg total) by mouth at bedtime as needed (Nausea or vomiting). 30 tablet 0  . ondansetron (ZOFRAN) 8 MG tablet Take 1 tablet (8 mg total) by mouth 2 (two) times daily. Start the day after chemo for 3 days. Then take as needed for nausea or vomiting. 30 tablet 1  . oxyCODONE-acetaminophen (ROXICET) 5-325 MG tablet Take 1-2 tablets by mouth every 4 (four) hours as needed. (Patient not taking: Reported on 09/15/2015) 30 tablet 0  . prochlorperazine (COMPAZINE) 10 MG tablet Take 1 tablet (10 mg total) by mouth every 6 (six) hours as needed (Nausea or vomiting). 30 tablet 1   No current facility-administered medications for this visit.    OBJECTIVE: Middle-aged African-American woman who appears stated age 55 Vitals:   09/15/15 1403 09/15/15 1429  BP: 171/103 172/116  Pulse: 106   Temp: 98.5 F (36.9 C)   Resp: 20      Body mass index is 41.27 kg/(m^2).    ECOG FS:1 - Symptomatic but completely ambulatory  Skin: warm, dry  HEENT: sclerae anicteric, conjunctivae pink, oropharynx clear. No thrush or mucositis.  Lymph Nodes: No cervical or supraclavicular lymphadenopathy  Lungs: clear to auscultation bilaterally, no rales, wheezes, or rhonci  Heart: regular rate and rhythm  Abdomen: round, soft, non tender, positive bowel sounds  Musculoskeletal: No focal spinal tenderness, no peripheral edema  Neuro: non focal, well oriented, positive affect  Breasts: deferred. Newly placed right chest portacath healing well.    LAB RESULTS:  CMP     Component Value Date/Time   NA 141 08/19/2015 0845   NA 136 06/29/2015 0923   K 3.7 08/19/2015 0845   K 4.0 06/29/2015 0923   CL 102 06/29/2015 0923   CO2 24 08/19/2015 0845   CO2 24 06/29/2015 0923   GLUCOSE 126 08/19/2015 0845   GLUCOSE 107* 06/29/2015 0923   BUN 16.1 08/19/2015 0845   BUN 7 06/29/2015 0923   CREATININE 0.9 08/19/2015 0845   CREATININE 0.84 06/29/2015 0923   CALCIUM 9.3 08/19/2015 0845   CALCIUM 9.2 06/29/2015 0923   PROT 8.1 08/19/2015 0845  PROT 7.8 06/29/2015 0923   ALBUMIN 3.5 08/19/2015 0845   ALBUMIN 3.5 06/29/2015 0923   AST 11 08/19/2015 0845   AST 20 06/29/2015 0923   ALT 14 08/19/2015 0845   ALT 21 06/29/2015 0923   ALKPHOS 132 08/19/2015 0845   ALKPHOS 87 06/29/2015 0923   BILITOT <0.30 08/19/2015 0845   BILITOT 1.0 06/29/2015 0923   GFRNONAA >60 06/29/2015 0923   GFRAA >60 06/29/2015 0923    INo results found for: SPEP, UPEP  Lab Results  Component Value Date   WBC 9.3 08/19/2015   NEUTROABS 6.4 08/19/2015   HGB 13.8 08/19/2015   HCT 41.9 08/19/2015   MCV 86.7 08/19/2015   PLT 214 08/19/2015      Chemistry      Component Value Date/Time   NA 141 08/19/2015 0845   NA 136 06/29/2015 0923   K 3.7 08/19/2015 0845   K 4.0 06/29/2015 0923   CL 102 06/29/2015 0923   CO2 24 08/19/2015 0845   CO2 24 06/29/2015 0923   BUN 16.1 08/19/2015 0845   BUN 7 06/29/2015 0923   CREATININE 0.9 08/19/2015 0845   CREATININE 0.84 06/29/2015 0923      Component Value Date/Time   CALCIUM 9.3 08/19/2015 0845   CALCIUM 9.2 06/29/2015 0923   ALKPHOS 132 08/19/2015 0845   ALKPHOS 87 06/29/2015 0923   AST 11 08/19/2015 0845   AST 20 06/29/2015 0923   ALT 14 08/19/2015 0845   ALT 21 06/29/2015 0923   BILITOT <0.30 08/19/2015 0845   BILITOT 1.0 06/29/2015 0923       No results found for: LABCA2  No components found for: [id number]  No results for input(s): INR in the last 168 hours.  Urinalysis    Component  Value Date/Time   COLORURINE YELLOW 06/29/2015 1030   APPEARANCEUR CLOUDY* 06/29/2015 1030   LABSPEC 1.019 06/29/2015 1030   PHURINE 7.0 06/29/2015 1030   GLUCOSEU NEGATIVE 06/29/2015 1030   HGBUR TRACE* 06/29/2015 1030   BILIRUBINUR NEGATIVE 06/29/2015 1030   KETONESUR NEGATIVE 06/29/2015 1030   PROTEINUR NEGATIVE 06/29/2015 1030   NITRITE NEGATIVE 06/29/2015 1030   LEUKOCYTESUR MODERATE* 06/29/2015 1030    STUDIES: Dg Chest Port 1 View  09/01/2015  CLINICAL DATA:  Port-A-Cath insertion EXAM: CHEST-ONE VIEW; FLUOROSCOPY -NO IMAGES FLUOROSCOPY TIME:  0 MINUTES 38 SECONDS; NO ACQUIRED FLUOROSCOPIC IMAGES. COMPARISON:  PET-CT May 24, 2015 FINDINGS: Port-A-Cath tip is in the superior vena cava near the cavoatrial junction. No pneumothorax. No edema or consolidation. Heart is upper normal in size with pulmonary vascular within normal limits. No adenopathy. IMPRESSION: Port-A-Cath tip in superior vena cava near cavoatrial junction. No pneumothorax. No edema or consolidation. Electronically Signed   By: Lowella Grip [name] M.D.   On: 09/01/2015 10:18   Dg Fluoro Guide Cv Line-no Report  09/01/2015  CLINICAL DATA:  FLOURO GUIDE CV LINE Fluoroscopy was utilized by the requesting physician.  No radiographic interpretation.     ASSESSMENT: 55 y.o. [place] woman with synchronous breast cancers, as follows  (a) status post right breast upper outer quadrant biopsy 04/19/2015 for a clinical pT1c N0, stage IA invasive ductal carcinoma, grade 1, estrogen receptor positive, progesterone receptor negative, with an MIB-1 of 5%, and no HER-2 amplification  (b) status post left breast biopsy 2 and left axillary lymph node biopsy 04/19/2015 for a clinical T3 N1, stage IIIA invasive ductal carcinoma, grade 1 or 2, estrogen receptor and progesterone receptor positive, HER-2 negative, with an MIB-1 between  5 and 10%  (1) status post bilateral mastectomies 05/09/2015, showing:   (a) on the right side, a  pT2 pN0, stage IIA invasive ductal carcinoma, with negative margins and repeat HER-2 again negative    (b) on the left, a pT3 pN2, stage IIIA invasive ductal carcinoma, grade 2, with negative margins, and repeat HER-2 again negative  (2) Mammaprint from the left-sided tumor returned "luminal type, low risk", predicting a small benefit from chemotherapy with the important caveat that N2 disease was not included in the MINDACT study  (3) Oncotype from the right-sided tumor showed a score of 12, predicting an 8% risk of recurrence outside the breast within the next 10 years, if the patient's only systemic therapy was tamoxifen for 5 years. It also predicted no significant benefit from chemotherapy  (4) postmastectomy radiation on the left to be completed 08/16/2014  (5) adjuvant chemotherapy to consist of cyclophosphamide and docetaxel x4 starting 09/19/15  (5) 10 years of antiestrogen therapy to follow with consideration of the PALLAS trial  PLAN: Foster and I spent 40 minutes discussing her upcoming chemotherapy plans. She was made aware of potential side effects and toxicities of cyclophosphamide and docetaxel. She attended chemotherapy school earlier this year so a lot of this information was a review for her. She was made aware of her neulasta injections, including the fact that these will be administered with an onbody injector that she will need to wear until the device is no longer active.   We reviewed her antiemetic schedule, and she feels comfortable with every medication listed as well as the indication and potential side effects. These medicines were sent to her pharmacy today. I have also added HCTZ 12.59m to go along with the losartan she has already been taken. Her blood pressures have been uncontrolled for some time now. I believe this is the source of her regular headaches and fatigue. I stressed the importance of a PCP and she was given the names and numbers of a few local physicians  who are accepting new patients. I will allow this individual to make any further changes in her antihypertensive regimen.   TMegawill return on Monday for her first cycle of treatment. She understands and agrees with this plan. She knows the goal of treatment in her case is cure. She has been encouraged to call with any issues that might arise before her next visit here.  HLaurie Panda NP   09/15/2015 3:17 PM

## 2015-09-15 NOTE — Telephone Encounter (Signed)
Gave patient avs report and appointments for February thru April.  °

## 2015-09-15 NOTE — Progress Notes (Signed)
Pt does not have a PCP. I gave pt a list of doctors at [facility] who are currently accepting new patients. I told pt to call and set up appt .

## 2015-09-19 ENCOUNTER — Ambulatory Visit (HOSPITAL_BASED_OUTPATIENT_CLINIC_OR_DEPARTMENT_OTHER): Payer: BLUE CROSS/BLUE SHIELD

## 2015-09-19 ENCOUNTER — Other Ambulatory Visit (HOSPITAL_BASED_OUTPATIENT_CLINIC_OR_DEPARTMENT_OTHER): Payer: BLUE CROSS/BLUE SHIELD

## 2015-09-19 ENCOUNTER — Encounter: Payer: Self-pay | Admitting: *Deleted

## 2015-09-19 VITALS — BP 154/86 | HR 78 | Temp 98.5°F | Resp 14

## 2015-09-19 DIAGNOSIS — C50411 Malignant neoplasm of upper-outer quadrant of right female breast: Secondary | ICD-10-CM

## 2015-09-19 DIAGNOSIS — Z5111 Encounter for antineoplastic chemotherapy: Secondary | ICD-10-CM

## 2015-09-19 DIAGNOSIS — C773 Secondary and unspecified malignant neoplasm of axilla and upper limb lymph nodes: Secondary | ICD-10-CM

## 2015-09-19 DIAGNOSIS — C50412 Malignant neoplasm of upper-outer quadrant of left female breast: Secondary | ICD-10-CM

## 2015-09-19 DIAGNOSIS — C50211 Malignant neoplasm of upper-inner quadrant of right female breast: Secondary | ICD-10-CM

## 2015-09-19 LAB — CBC WITH DIFFERENTIAL/PLATELET
BASO%: 0.8 % (ref 0.0–2.0)
Basophils Absolute: 0.1 10*3/uL (ref 0.0–0.1)
EOS%: 2.3 % (ref 0.0–7.0)
Eosinophils Absolute: 0.2 10*3/uL (ref 0.0–0.5)
HCT: 41.2 % (ref 34.8–46.6)
HGB: 13.6 g/dL (ref 11.6–15.9)
LYMPH%: 20.3 % (ref 14.0–49.7)
MCH: 28.6 pg (ref 25.1–34.0)
MCHC: 33.1 g/dL (ref 31.5–36.0)
MCV: 86.3 fL (ref 79.5–101.0)
MONO#: 0.6 10*3/uL (ref 0.1–0.9)
MONO%: 8.5 % (ref 0.0–14.0)
NEUT#: 5 10*3/uL (ref 1.5–6.5)
NEUT%: 68.1 % (ref 38.4–76.8)
Platelets: 207 10*3/uL (ref 145–400)
RBC: 4.77 10*6/uL (ref 3.70–5.45)
RDW: 14.6 % — ABNORMAL HIGH (ref 11.2–14.5)
WBC: 7.4 10*3/uL (ref 3.9–10.3)
lymph#: 1.5 10*3/uL (ref 0.9–3.3)

## 2015-09-19 LAB — COMPREHENSIVE METABOLIC PANEL
ALT: 18 U/L (ref 0–55)
AST: 13 U/L (ref 5–34)
Albumin: 3.4 g/dL — ABNORMAL LOW (ref 3.5–5.0)
Alkaline Phosphatase: 122 U/L (ref 40–150)
Anion Gap: 11 mEq/L (ref 3–11)
BUN: 12.7 mg/dL (ref 7.0–26.0)
CO2: 24 mEq/L (ref 22–29)
Calcium: 9.5 mg/dL (ref 8.4–10.4)
Chloride: 105 mEq/L (ref 98–109)
Creatinine: 0.9 mg/dL (ref 0.6–1.1)
EGFR: 80 mL/min/{1.73_m2} — ABNORMAL LOW (ref >90)
Glucose: 153 mg/dl — ABNORMAL HIGH (ref 70–140)
Potassium: 3.5 mEq/L (ref 3.5–5.1)
Sodium: 140 mEq/L (ref 136–145)
Total Bilirubin: <0.3 mg/dL (ref 0.20–1.20)
Total Protein: 8 g/dL (ref 6.4–8.3)

## 2015-09-19 MED ORDER — PEGFILGRASTIM 6 MG/0.6ML ~~LOC~~ PSKT
6.0000 mg | PREFILLED_SYRINGE | Freq: Once | SUBCUTANEOUS | Status: AC
  Administered 2015-09-19: 6 mg via SUBCUTANEOUS
  Filled 2015-09-19: qty 0.6

## 2015-09-19 MED ORDER — DOCETAXEL CHEMO INJECTION 160 MG/16ML
75.0000 mg/m2 | Freq: Once | INTRAVENOUS | Status: AC
  Administered 2015-09-19: 190 mg via INTRAVENOUS
  Filled 2015-09-19: qty 19

## 2015-09-19 MED ORDER — HEPARIN SOD (PORK) LOCK FLUSH 100 UNIT/ML IV SOLN
500.0000 [IU] | Freq: Once | INTRAVENOUS | Status: AC | PRN
  Administered 2015-09-19: 500 [IU]
  Filled 2015-09-19: qty 5

## 2015-09-19 MED ORDER — SODIUM CHLORIDE 0.9 % IJ SOLN
10.0000 mL | INTRAMUSCULAR | Status: DC | PRN
  Administered 2015-09-19: 10 mL
  Filled 2015-09-19: qty 10

## 2015-09-19 MED ORDER — SODIUM CHLORIDE 0.9 % IV SOLN
600.0000 mg/m2 | Freq: Once | INTRAVENOUS | Status: AC
  Administered 2015-09-19: 1500 mg via INTRAVENOUS
  Filled 2015-09-19: qty 75

## 2015-09-19 MED ORDER — SODIUM CHLORIDE 0.9 % IV SOLN
Freq: Once | INTRAVENOUS | Status: AC
  Administered 2015-09-19: 11:00:00 via INTRAVENOUS

## 2015-09-19 MED ORDER — SODIUM CHLORIDE 0.9 % IV SOLN
Freq: Once | INTRAVENOUS | Status: AC
  Administered 2015-09-19: 12:00:00 via INTRAVENOUS
  Filled 2015-09-19: qty 8

## 2015-09-19 NOTE — Progress Notes (Signed)
At 1345 Pt c/o of heavy feeling in chest with no other symptoms & VS stable & states she has felt this before.  Stopped taxotere for 10 min & reported to Dr Jana Hakim & he is OK to cont treatment.  @ 100 ml left in taxotere.  Restarted after 10 min & no further c/o's.

## 2015-09-19 NOTE — Patient Instructions (Signed)
Docetaxel injection  What is this medicine?  DOCETAXEL (doe se TAX el) is a chemotherapy drug. It targets fast dividing cells, like cancer cells, and causes these cells to die. This medicine is used to treat many types of cancers like breast cancer, certain stomach cancers, head and neck cancer, lung cancer, and prostate cancer.  This medicine may be used for other purposes; ask your health care provider or pharmacist if you have questions.  What should I tell my health care provider before I take this medicine?  They need to know if you have any of these conditions:  -infection (especially a virus infection such as chickenpox, cold sores, or herpes)  -liver disease  -low blood counts, like low white cell, platelet, or red cell counts  -an unusual or allergic reaction to docetaxel, polysorbate 80, other chemotherapy agents, other medicines, foods, dyes, or preservatives  -pregnant or trying to get pregnant  -breast-feeding  How should I use this medicine?  This drug is given as an infusion into a vein. It is administered in a hospital or clinic by a specially trained health care professional.  Talk to your pediatrician regarding the use of this medicine in children. Special care may be needed.  Overdosage: If you think you have taken too much of this medicine contact a poison control center or emergency room at once.  NOTE: This medicine is only for you. Do not share this medicine with others.  What if I miss a dose?  It is important not to miss your dose. Call your doctor or health care professional if you are unable to keep an appointment.  What may interact with this medicine?  -cyclosporine  -erythromycin  -ketoconazole  -medicines to increase blood counts like filgrastim, pegfilgrastim, sargramostim  -vaccines  Talk to your doctor or health care professional before taking any of these medicines:  -acetaminophen  -aspirin  -ibuprofen  -ketoprofen  -naproxen  This list may not describe all possible interactions.  Give your health care provider a list of all the medicines, herbs, non-prescription drugs, or dietary supplements you use. Also tell them if you smoke, drink alcohol, or use illegal drugs. Some items may interact with your medicine.  What should I watch for while using this medicine?  Your condition will be monitored carefully while you are receiving this medicine. You will need important blood work done while you are taking this medicine.  This drug may make you feel generally unwell. This is not uncommon, as chemotherapy can affect healthy cells as well as cancer cells. Report any side effects. Continue your course of treatment even though you feel ill unless your doctor tells you to stop.  In some cases, you may be given additional medicines to help with side effects. Follow all directions for their use.  Call your doctor or health care professional for advice if you get a fever, chills or sore throat, or other symptoms of a cold or flu. Do not treat yourself. This drug decreases your body's ability to fight infections. Try to avoid being around people who are sick.  This medicine may increase your risk to bruise or bleed. Call your doctor or health care professional if you notice any unusual bleeding.  This medicine may contain alcohol in the product. You may get drowsy or dizzy. Do not drive, use machinery, or do anything that needs mental alertness until you know how this medicine affects you. Do not stand or sit up quickly, especially if you are an older   There is a potential for serious side effects to an unborn child. Talk to your health care professional or pharmacist for more information. Do not breast-feed an infant while taking this medicine. What side effects may I notice  from receiving this medicine? Side effects that you should report to your doctor or health care professional as soon as possible: -allergic reactions like skin rash, itching or hives, swelling of the face, lips, or tongue -low blood counts - This drug may decrease the number of white blood cells, red blood cells and platelets. You may be at increased risk for infections and bleeding. -signs of infection - fever or chills, cough, sore throat, pain or difficulty passing urine -signs of decreased platelets or bleeding - bruising, pinpoint red spots on the skin, black, tarry stools, nosebleeds -signs of decreased red blood cells - unusually weak or tired, fainting spells, lightheadedness -breathing problems -fast or irregular heartbeat -low blood pressure -mouth sores -nausea and vomiting -pain, swelling, redness or irritation at the injection site -pain, tingling, numbness in the hands or feet -swelling of the ankle, feet, hands -weight gain Side effects that usually do not require medical attention (report to your prescriber or health care professional if they continue or are bothersome): -bone pain -complete hair loss including hair on your head, underarms, pubic hair, eyebrows, and eyelashes -diarrhea -excessive tearing -changes in the color of fingernails -loosening of the fingernails -nausea -muscle pain -red flush to skin -sweating -weak or tired This list may not describe all possible side effects. Call your doctor for medical advice about side effects. You may report side effects to FDA at 1-800-FDA-1088. Where should I keep my medicine? This drug is given in a hospital or clinic and will not be stored at home. NOTE: This sheet is a summary. It may not cover all possible information. If you have questions about this medicine, talk to your doctor, pharmacist, or health care provider.    2016, [employer]. (2014-08-09 16: Cyclophosphamide injection What is this  medicine? CYCLOPHOSPHAMIDE (sye kloe FOSS fa mide) is a chemotherapy drug. It slows the growth of cancer cells. This medicine is used to treat many types of cancer like lymphoma, myeloma, leukemia, breast cancer, and ovarian cancer, to name a few. This medicine may be used for other purposes; ask your health care provider or pharmacist if you have questions. What should I tell my health care provider before I take this medicine? They need to know if you have any of these conditions: -blood disorders -history of other chemotherapy -infection -kidney disease -liver disease -recent or ongoing radiation therapy -tumors in the bone marrow -an unusual or allergic reaction to cyclophosphamide, other chemotherapy, other medicines, foods, dyes, or preservatives -pregnant or trying to get pregnant -breast-feeding How should I use this medicine? This drug is usually given as an injection into a vein or muscle or by infusion into a vein. It is administered in a hospital or clinic by a specially trained health care professional. Talk to your pediatrician regarding the use of this medicine in children. Special care may be needed. Overdosage: If you think you have taken too much of this medicine contact a poison control center or emergency room at once. NOTE: This medicine is only for you. Do not share this medicine with others. What if I miss a dose? It is important not to miss your dose. Call your doctor or health care professional if you are unable to keep an appointment. What may interact with this medicine?  specially trained health care professional.  Talk to your pediatrician regarding the use of this medicine in children. Special care may be needed.  Overdosage: If you think you have taken too much of this medicine contact a poison control center or emergency room at once.  NOTE: This medicine is only for you. Do not share this medicine with others.  What if I miss a dose?  It is important not to miss your dose. Call your doctor or health care professional if you are unable to keep an appointment.  What may interact with this medicine?  This medicine may interact with the following medications:  -amiodarone  -amphotericin B  -azathioprine  -certain antiviral medicines for HIV or AIDS such as protease inhibitors (e.g., indinavir, ritonavir) and zidovudine  -certain blood pressure medications such as benazepril, captopril, enalapril, fosinopril, lisinopril, moexipril, monopril, perindopril, quinapril, ramipril,  trandolapril  -certain cancer medications such as anthracyclines (e.g., daunorubicin, doxorubicin), busulfan, cytarabine, paclitaxel, pentostatin, tamoxifen, trastuzumab  -certain diuretics such as chlorothiazide, chlorthalidone, hydrochlorothiazide, indapamide, metolazone  -certain medicines that treat or prevent blood clots like warfarin  -certain muscle relaxants such as succinylcholine  -cyclosporine  -etanercept  -indomethacin  -medicines to increase blood counts like filgrastim, pegfilgrastim, sargramostim  -medicines used as general anesthesia  -metronidazole  -natalizumab  This list may not describe all possible interactions. Give your health care provider a list of all the medicines, herbs, non-prescription drugs, or dietary supplements you use. Also tell them if you smoke, drink alcohol, or use illegal drugs. Some items may interact with your medicine.  What should I watch for while using this medicine?  Visit your doctor for checks on your progress. This drug may make you feel generally unwell. This is not uncommon, as chemotherapy can affect healthy cells as well as cancer cells. Report any side effects. Continue your course of treatment even though you feel ill unless your doctor tells you to stop.  Drink water or other fluids as directed. Urinate often, even at night.  In some cases, you may be given additional medicines to help with side effects. Follow all directions for their use.  Call your doctor or health care professional for advice if you get a fever, chills or sore throat, or other symptoms of a cold or flu. Do not treat yourself. This drug decreases your body's ability to fight infections. Try to avoid being around people who are sick.  This medicine may increase your risk to bruise or bleed. Call your doctor or health care professional if you notice any unusual bleeding.  Be careful brushing and flossing your teeth or using a toothpick because you may get an infection or bleed more easily.  If you have any dental work done, tell your dentist you are receiving this medicine.  You may get drowsy or dizzy. Do not drive, use machinery, or do anything that needs mental alertness until you know how this medicine affects you.  Do not become pregnant while taking this medicine or for 1 year after stopping it. Women should inform their doctor if they wish to become pregnant or think they might be pregnant. Men should not father a child while taking this medicine and for 4 months after stopping it. There is a potential for serious side effects to an unborn child. Talk to your health care professional or pharmacist for more information. Do not breast-feed an infant while taking this medicine.  This medicine may interfere with the ability to have a child. This medicine has   fever or chills, cough, sore throat, pain or difficulty passing urine -signs of decreased platelets or bleeding - bruising, pinpoint red spots on the skin, black, tarry stools, blood in the urine -signs of decreased red blood cells - unusually weak or tired, fainting spells, lightheadedness -breathing problems -dark urine -dizziness -palpitations -swelling of the ankles, feet, hands -trouble passing urine or  change in the amount of urine -weight gain -yellowing of the eyes or skin Side effects that usually do not require medical attention (report to your doctor or health care professional if they continue or are bothersome): -changes in nail or skin color -hair loss -missed menstrual periods -mouth sores -nausea, vomiting This list may not describe all possible side effects. Call your doctor for medical advice about side effects. You may report side effects to FDA at 1-800-FDA-1088. Where should I keep my medicine? This drug is given in a hospital or clinic and will not be stored at home. NOTE: This sheet is a summary. It may not cover all possible information. If you have questions about this medicine, talk to your doctor, pharmacist, or health care provider.    2016, [employer] Standard. (2012-06-06 16:22:58)  [facility] Discharge Instructions for Patients Receiving Chemotherapy  Today you received the following chemotherapy agents:  Taxotere/Cytoxan  To help prevent nausea and vomiting after your treatment, we encourage you to take your nausea medication.   If you develop nausea and vomiting that is not controlled by your nausea medication, call the clinic.   BELOW ARE SYMPTOMS THAT SHOULD BE REPORTED IMMEDIATELY:  *FEVER GREATER THAN 100.5 F  *CHILLS WITH OR WITHOUT FEVER  NAUSEA AND VOMITING THAT IS NOT CONTROLLED WITH YOUR NAUSEA MEDICATION  *UNUSUAL SHORTNESS OF BREATH  *UNUSUAL BRUISING OR BLEEDING  TENDERNESS IN MOUTH AND THROAT WITH OR WITHOUT PRESENCE OF ULCERS  *URINARY PROBLEMS  *BOWEL PROBLEMS  UNUSUAL RASH Items with * indicate a potential emergency and should be followed up as soon as possible.  Feel free to call the clinic you have any questions or concerns. The clinic phone number is [phone].  Please show the [facility] at check-in to the Emergency Department and triage nurse.

## 2015-09-20 LAB — CANCER ANTIGEN 27.29: CA 27.29: 12.7 U/mL (ref 0.0–38.6)

## 2015-09-26 ENCOUNTER — Ambulatory Visit (HOSPITAL_BASED_OUTPATIENT_CLINIC_OR_DEPARTMENT_OTHER): Payer: BLUE CROSS/BLUE SHIELD | Admitting: Nurse Practitioner

## 2015-09-26 ENCOUNTER — Encounter: Payer: Self-pay | Admitting: Nurse Practitioner

## 2015-09-26 ENCOUNTER — Encounter: Payer: Self-pay | Admitting: *Deleted

## 2015-09-26 ENCOUNTER — Other Ambulatory Visit (HOSPITAL_BASED_OUTPATIENT_CLINIC_OR_DEPARTMENT_OTHER): Payer: BLUE CROSS/BLUE SHIELD

## 2015-09-26 VITALS — BP 153/96 | HR 91 | Temp 98.1°F | Resp 18 | Ht 69.0 in | Wt 280.6 lb

## 2015-09-26 DIAGNOSIS — C50411 Malignant neoplasm of upper-outer quadrant of right female breast: Secondary | ICD-10-CM | POA: Diagnosis not present

## 2015-09-26 DIAGNOSIS — I10 Essential (primary) hypertension: Secondary | ICD-10-CM

## 2015-09-26 DIAGNOSIS — C773 Secondary and unspecified malignant neoplasm of axilla and upper limb lymph nodes: Secondary | ICD-10-CM | POA: Diagnosis not present

## 2015-09-26 DIAGNOSIS — R12 Heartburn: Secondary | ICD-10-CM

## 2015-09-26 DIAGNOSIS — C50412 Malignant neoplasm of upper-outer quadrant of left female breast: Secondary | ICD-10-CM

## 2015-09-26 DIAGNOSIS — M545 Low back pain, unspecified: Secondary | ICD-10-CM

## 2015-09-26 DIAGNOSIS — C50912 Malignant neoplasm of unspecified site of left female breast: Secondary | ICD-10-CM

## 2015-09-26 DIAGNOSIS — C50211 Malignant neoplasm of upper-inner quadrant of right female breast: Secondary | ICD-10-CM

## 2015-09-26 DIAGNOSIS — K1230 Oral mucositis (ulcerative), unspecified: Secondary | ICD-10-CM

## 2015-09-26 DIAGNOSIS — R252 Cramp and spasm: Secondary | ICD-10-CM

## 2015-09-26 DIAGNOSIS — T451X5A Adverse effect of antineoplastic and immunosuppressive drugs, initial encounter: Secondary | ICD-10-CM

## 2015-09-26 DIAGNOSIS — K521 Toxic gastroenteritis and colitis: Secondary | ICD-10-CM

## 2015-09-26 DIAGNOSIS — K1231 Oral mucositis (ulcerative) due to antineoplastic therapy: Secondary | ICD-10-CM

## 2015-09-26 LAB — CBC WITH DIFFERENTIAL/PLATELET
BASO%: 0.5 % (ref 0.0–2.0)
Basophils Absolute: 0.1 10*3/uL (ref 0.0–0.1)
EOS%: 0.5 % (ref 0.0–7.0)
Eosinophils Absolute: 0.1 10*3/uL (ref 0.0–0.5)
HCT: 42 % (ref 34.8–46.6)
HGB: 13.7 g/dL (ref 11.6–15.9)
LYMPH%: 17 % (ref 14.0–49.7)
MCH: 28 pg (ref 25.1–34.0)
MCHC: 32.7 g/dL (ref 31.5–36.0)
MCV: 85.7 fL (ref 79.5–101.0)
MONO#: 2 10*3/uL — ABNORMAL HIGH (ref 0.1–0.9)
MONO%: 12.8 % (ref 0.0–14.0)
NEUT#: 10.8 10*3/uL — ABNORMAL HIGH (ref 1.5–6.5)
NEUT%: 69.2 % (ref 38.4–76.8)
Platelets: 165 10*3/uL (ref 145–400)
RBC: 4.9 10*6/uL (ref 3.70–5.45)
RDW: 14.6 % — ABNORMAL HIGH (ref 11.2–14.5)
WBC: 15.6 10*3/uL — ABNORMAL HIGH (ref 3.9–10.3)
lymph#: 2.7 10*3/uL (ref 0.9–3.3)

## 2015-09-26 LAB — COMPREHENSIVE METABOLIC PANEL
ALT: 19 U/L (ref 0–55)
AST: 13 U/L (ref 5–34)
Albumin: 3.4 g/dL — ABNORMAL LOW (ref 3.5–5.0)
Alkaline Phosphatase: 150 U/L (ref 40–150)
Anion Gap: 7 mEq/L (ref 3–11)
BUN: 15 mg/dL (ref 7.0–26.0)
CO2: 28 mEq/L (ref 22–29)
Calcium: 9.1 mg/dL (ref 8.4–10.4)
Chloride: 107 mEq/L (ref 98–109)
Creatinine: 0.8 mg/dL (ref 0.6–1.1)
EGFR: >90 mL/min/{1.73_m2} (ref >90)
Glucose: 103 mg/dl (ref 70–140)
Potassium: 3.5 mEq/L (ref 3.5–5.1)
Sodium: 142 mEq/L (ref 136–145)
Total Bilirubin: <0.3 mg/dL (ref 0.20–1.20)
Total Protein: 7.4 g/dL (ref 6.4–8.3)

## 2015-09-26 MED ORDER — CYCLOBENZAPRINE HCL 5 MG PO TABS: 5.0000 mg | ORAL_TABLET | Freq: Three times a day (TID) | ORAL | Status: DC | PRN

## 2015-09-26 MED ORDER — NAPROXEN 500 MG PO TABS
500.0000 mg | ORAL_TABLET | Freq: Two times a day (BID) | ORAL | Status: DC
Start: 2015-09-26 — End: 2015-11-21

## 2015-09-26 MED ORDER — VALACYCLOVIR HCL 500 MG PO TABS: 500.0000 mg | ORAL_TABLET | Freq: Every day | ORAL | Status: DC

## 2015-09-26 MED ORDER — NYSTATIN 100000 UNIT/ML MT SUSP: 5.0000 mL | Freq: Four times a day (QID) | OROMUCOSAL | Status: DC

## 2015-09-26 MED ORDER — PANTOPRAZOLE SODIUM 40 MG PO TBEC: 40.0000 mg | DELAYED_RELEASE_TABLET | Freq: Every day | ORAL | Status: DC

## 2015-09-26 NOTE — Progress Notes (Signed)
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 She has been receiving medical care through the emergency room and was seen in March 2015 following a head injury, then in August 2015 for lancing of an abscess. In December 2015 she noticed that her left breast looked a little bit smaller than her right. She brought this to the attention of friends and family over the next several months but the general feeling was that most people are not perfectly symmetrical. By the summer of this year she noticed that her nipple on the left was "going in". More recently she started developing pain in the left breast. She was evaluated for this in the emergency room 04/09/2015 at which time a left breast exam showed a deformed left breast with a large hard mass encompassing most of the breast, with nipple retraction. There was no nipple discharge or bleeding and no palpable adenopathy.  The patient was referred to [facility] and on 04/13/2015 she underwent bilateral diagnostic mammography with tomosynthesis as well as bilateral breast ultrasound. The breast density was category B. In the right breast at the 11:00 position there was a 1.3 cm irregular  mass which by ultrasonography measured 1.3 cm.--In the left breast there was a 4 cm irregular mass at the 2:00 position associated with nipple retraction. There was a second, 1.7 cm area of architectural distortion at the 9:00 position. Both were palpable. By ultrasonography, the 4 cm irregular mass was noted, with a second mass measuring 2.5 cm by ultrasonography. Both axillae were benign.  On 04/19/2015 the patient underwent right breast upper outer quadrant biopsy, showing ([id number]) and invasive ductal carcinoma, grade 1, estrogen receptor 90% positive, progesterone receptor negative, with an MIB-1 of 5%, and no HER-2 amplification, the signals ratio being 1.26 and the number per cell 2.20.  On the same day, she underwent biopsy of the 2 left breast masses in question as well as a suspicious left axillary lymph node. All 3 biopsies showed invasive ductal carcinoma, grade 2, estrogen receptor 80-100% positive, progesterone receptor 80-100% positive, with the MIB-1 between 5 and 10%, and no HER-2 amplification, the signals ratio being between 1.05 and 1.13, and the number per cell between 2.10 and 2.25.  The patient's subsequent history is as detailed below  INTERVAL HISTORY: Ambrielle returns today for follow-up of her bilateral breast cancer. Today is day 8, cycle 1 of cyclophosphamide and docetaxel, with neulasta given on day 2 of granulocyte support.   REVIEW OF SYSTEMS: Azalie had a rough week. She had diarrhea all day Saturday, but never took anything for this. She feels like food gets stuck in her throat when she swallows and her chest is burning. She has mouth sores and tenderness so she has been eating soft and liquid foods only. A right upper  tooth is aching. This needed dental work prior to chemotherapy. Her biggest complaint is lower back pain and spasms. She tried tylenol but this was not helpful. A friend gave her a hydrocodone tablet and this provided temporary relief. Her blood pressure  is high today but she has not taken her lisinopril yet. She claims the addition kept it controled at home this past week. A detailed review of systems is otherwise stable.  PAST MEDICAL HISTORY: Past Medical History  Diagnosis Date  . Hypertension   . Breast cancer of upper-outer quadrant of left female breast ([place]) 04/21/2015  . Breast cancer ([place])   . GERD (gastroesophageal reflux disease)   . Bilateral breast cancer ([place])     PAST SURGICAL HISTORY: Past Surgical History  Procedure Laterality Date  . Abdominal hysterectomy  1990  . Tubal ligation  1984  . Mastectomy w/ sentinel node biopsy Right   . Mastectomy modified radical Left 05/09/2015  . Mastectomy w/ sentinel node biopsy Right 05/09/2015    Procedure: RIGHT MASTECTOMY WITH RIGHT AXILLARY SENTINEL LYMPH NODE BIOPSY;  Surgeon: Autumn Messing III, MD;  Location: [facility];  Service: General;  Laterality: Right;  . Mastectomy modified radical Left 05/09/2015    Procedure: LEFT MODIFIED RADICAL MASTETCTOMY;  Surgeon: Autumn Messing III, MD;  Location: [facility];  Service: General;  Laterality: Left;  . Portacath placement Right 09/01/2015    Procedure: INSERTION PORT-A-CATH;  Surgeon: Autumn Messing III, MD;  Location: [facility];  Service: General;  Laterality: Right;    FAMILY HISTORY Family History  Problem Relation Age of Onset  . Hypertension Mother   . Diabetes Mother     the patient has little information on her father. Her mother is living, currently age 54. She has one brother, 2 sisters. There is no history of breast or ovarian cancer in the family to her knowledge.   GYNECOLOGIC HISTORY:  No LMP recorded. Patient has had a hysterectomy.  menarche age 63, first live birth age 16. She is GX P4. She underwent a total abdominal hysterectomy with bilateral salpingo-oophorectomy at age 34. She did not take hormone replacement. She never used oral contraceptives.   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 She is not employed by [employer] but by a Set designer. She is single and lives with her significant other [name]. He is status post renal transplant. The patient's daughter  [name] works as a Programmer, applications, as does her daughter  [name]. Daughter [name] works at [employer], as a call. All 3 live in [place]. The patient's son Marc Morgans Weir's lives in [place]. He prepares sets for shows The patient has 4 grandchildren, no great grandchildren. She attends a [employer] in [place].     ADVANCED DIRECTIVES: Not in place. At the initial clinic visit the patient was given the appropriate forms to complete and notarize at her discretion. The patient intends to name her daughter Sampson Si as healthcare power of attorney. She can be reached at [phone].  HEALTH MAINTENANCE: Social History  Substance Use Topics  . Smoking status: Former Smoker -- 0.10 packs/day for 28 years  . Smokeless tobacco: Never Used  . Alcohol Use: No     Colonoscopy: Never  PAP: Status post hysterectomy  Bone density: Never  Lipid panel:  Allergies  Allergen Reactions  . Latex Itching and Other (See Comments)    burning  .  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 Wheat Bran Hives    Current Outpatient Prescriptions  Medication Sig Dispense Refill  . acetaminophen (TYLENOL) 500 MG tablet Take 1,000 mg by mouth every 6 (six) hours as needed for mild pain. Reported on 08/15/2015    . dexamethasone (DECADRON) 4 MG tablet Take 2 tablets (8 mg total) by mouth 2 (two) times daily. Start the day before Taxotere. Then again the day after chemo for 3 days. 30 tablet 1  . gabapentin (NEURONTIN) 300 MG capsule Take 1 capsule (300 mg total) by mouth 3 (three) times daily. To control nerve pain 50 capsule 2  . hydrochlorothiazide (HYDRODIURIL) 12.5 MG tablet Take 1 tablet (12.5 mg total) by mouth daily. 30  tablet 3  . ibuprofen (ADVIL,MOTRIN) 200 MG tablet Take 800-1,000 mg by mouth 3 (three) times daily as needed for headache. Reported on 09/15/2015    . lidocaine-prilocaine (EMLA) cream Apply to affected area once 30 g 3  . LORazepam (ATIVAN) 0.5 MG tablet Take 1 tablet (0.5 mg total) by mouth at bedtime as needed (Nausea or vomiting). 30 tablet 0  . losartan (COZAAR) 50 MG tablet Take 50 mg by mouth daily.    . ondansetron (ZOFRAN) 8 MG tablet Take 1 tablet (8 mg total) by mouth 2 (two) times daily. Start the day after chemo for 3 days. Then take as needed for nausea or vomiting. 30 tablet 1  . prochlorperazine (COMPAZINE) 10 MG tablet Take 1 tablet (10 mg total) by mouth every 6 (six) hours as needed (Nausea or vomiting). 30 tablet 1  . venlafaxine XR (EFFEXOR XR) 75 MG 24 hr capsule Take 1 capsule (75 mg total) by mouth daily with breakfast. 90 capsule 2  . cyclobenzaprine (FLEXERIL) 5 MG tablet Take 1 tablet (5 mg total) by mouth 3 (three) times daily as needed for muscle spasms. 30 tablet 0  . naproxen (NAPROSYN) 500 MG tablet Take 1 tablet (500 mg total) by mouth 2 (two) times daily with a meal. 60 tablet 1  . nystatin (MYCOSTATIN) 100000 UNIT/ML suspension Take 5 mLs (500,000 Units total) by mouth 4 (four) times daily. 240 mL 0  . oxyCODONE-acetaminophen (ROXICET) 5-325 MG tablet Take 1-2 tablets by mouth every 4 (four) hours as needed. (Patient not taking: Reported on 09/15/2015) 30 tablet 0  . pantoprazole (PROTONIX) 40 MG tablet Take 1 tablet (40 mg total) by mouth daily. 30 tablet 1  . valACYclovir (VALTREX) 500 MG tablet Take 1 tablet (500 mg total) by mouth daily. 30 tablet 1   No current facility-administered medications for this visit.    OBJECTIVE: Middle-aged African-American woman who appears stated age 55 Vitals:   09/26/15 1355 09/26/15 1356  BP: 168/103 153/96  Pulse: 91   Temp: 98.1 F (36.7 C)   Resp: 18      Body mass index is 41.42 kg/(m^2).    ECOG FS:1 -  Symptomatic but completely ambulatory  Sclerae unicteric, pupils round and equal Oropharynx singular lesion to right lateral tongue, tongue erythematous with several taste buds inflamed No cervical or supraclavicular adenopathy Lungs no rales or rhonchi Heart regular rate and rhythm Abd soft, nontender, positive bowel sounds MSK no focal spinal tenderness, no upper extremity lymphedema Neuro: nonfocal, well oriented, appropriate affect Breasts: deferred  LAB RESULTS:  CMP     Component Value Date/Time   NA 142 09/26/2015 1311   NA 136 06/29/2015 0923   K 3.5  09/26/2015 1311   K 4.0 06/29/2015 0923   CL 102 06/29/2015 0923   CO2 28 09/26/2015 1311   CO2 24 06/29/2015 0923   GLUCOSE 103 09/26/2015 1311   GLUCOSE 107* 06/29/2015 0923   BUN 15.0 09/26/2015 1311   BUN 7 06/29/2015 0923   CREATININE 0.8 09/26/2015 1311   CREATININE 0.84 06/29/2015 0923   CALCIUM 9.1 09/26/2015 1311   CALCIUM 9.2 06/29/2015 0923   PROT 7.4 09/26/2015 1311   PROT 7.8 06/29/2015 0923   ALBUMIN 3.4* 09/26/2015 1311   ALBUMIN 3.5 06/29/2015 0923   AST 13 09/26/2015 1311   AST 20 06/29/2015 0923   ALT 19 09/26/2015 1311   ALT 21 06/29/2015 0923   ALKPHOS 150 09/26/2015 1311   ALKPHOS 87 06/29/2015 0923   BILITOT <0.30 09/26/2015 1311   BILITOT 1.0 06/29/2015 0923   GFRNONAA >60 06/29/2015 0923   GFRAA >60 06/29/2015 0923    INo results found for: SPEP, UPEP  Lab Results  Component Value Date   WBC 15.6* 09/26/2015   NEUTROABS 10.8* 09/26/2015   HGB 13.7 09/26/2015   HCT 42.0 09/26/2015   MCV 85.7 09/26/2015   PLT 165 09/26/2015      Chemistry      Component Value Date/Time   NA 142 09/26/2015 1311   NA 136 06/29/2015 0923   K 3.5 09/26/2015 1311   K 4.0 06/29/2015 0923   CL 102 06/29/2015 0923   CO2 28 09/26/2015 1311   CO2 24 06/29/2015 0923   BUN 15.0 09/26/2015 1311   BUN 7 06/29/2015 0923   CREATININE 0.8 09/26/2015 1311   CREATININE 0.84 06/29/2015 0923        Component Value Date/Time   CALCIUM 9.1 09/26/2015 1311   CALCIUM 9.2 06/29/2015 0923   ALKPHOS 150 09/26/2015 1311   ALKPHOS 87 06/29/2015 0923   AST 13 09/26/2015 1311   AST 20 06/29/2015 0923   ALT 19 09/26/2015 1311   ALT 21 06/29/2015 0923   BILITOT <0.30 09/26/2015 1311   BILITOT 1.0 06/29/2015 0923       No results found for: LABCA2  No components found for: [id number]  No results for input(s): INR in the last 168 hours.  Urinalysis    Component Value Date/Time   COLORURINE YELLOW 06/29/2015 1030   APPEARANCEUR CLOUDY* 06/29/2015 1030   LABSPEC 1.019 06/29/2015 1030   PHURINE 7.0 06/29/2015 1030   GLUCOSEU NEGATIVE 06/29/2015 1030   HGBUR TRACE* 06/29/2015 1030   BILIRUBINUR NEGATIVE 06/29/2015 1030   KETONESUR NEGATIVE 06/29/2015 1030   PROTEINUR NEGATIVE 06/29/2015 1030   NITRITE NEGATIVE 06/29/2015 1030   LEUKOCYTESUR MODERATE* 06/29/2015 1030    STUDIES: Dg Chest Port 1 View  09/01/2015  CLINICAL DATA:  Port-A-Cath insertion EXAM: CHEST-ONE VIEW; FLUOROSCOPY -NO IMAGES FLUOROSCOPY TIME:  0 MINUTES 38 SECONDS; NO ACQUIRED FLUOROSCOPIC IMAGES. COMPARISON:  PET-CT May 24, 2015 FINDINGS: Port-A-Cath tip is in the superior vena cava near the cavoatrial junction. No pneumothorax. No edema or consolidation. Heart is upper normal in size with pulmonary vascular within normal limits. No adenopathy. IMPRESSION: Port-A-Cath tip in superior vena cava near cavoatrial junction. No pneumothorax. No edema or consolidation. Electronically Signed   By: Lowella Grip III M.D.   On: 09/01/2015 10:18   Dg Fluoro Guide Cv Line-no Report  09/01/2015  CLINICAL DATA:  FLOURO GUIDE CV LINE Fluoroscopy was utilized by the requesting physician.  No radiographic interpretation.     ASSESSMENT: 55 y.o.  woman with synchronous  breast cancers, as follows  (a) status post right breast upper outer quadrant biopsy 04/19/2015 for a clinical pT1c N0, stage IA invasive ductal  carcinoma, grade 1, estrogen receptor positive, progesterone receptor negative, with an MIB-1 of 5%, and no HER-2 amplification  (b) status post left breast biopsy 2 and left axillary lymph node biopsy 04/19/2015 for a clinical T3 N1, stage IIIA invasive ductal carcinoma, grade 1 or 2, estrogen receptor and progesterone receptor positive, HER-2 negative, with an MIB-1 between 5 and 10%  (1) status post bilateral mastectomies 05/09/2015, showing:   (a) on the right side, a pT2 pN0, stage IIA invasive ductal carcinoma, with negative margins and repeat HER-2 again negative    (b) on the left, a pT3 pN2, stage IIIA invasive ductal carcinoma, grade 2, with negative margins, and repeat HER-2 again negative  (2) Mammaprint from the left-sided tumor returned "luminal type, low risk", predicting a small benefit from chemotherapy with the important caveat that N2 disease was not included in the MINDACT study  (3) Oncotype from the right-sided tumor showed a score of 12, predicting an 8% risk of recurrence outside the breast within the next 10 years, if the patient's only systemic therapy was tamoxifen for 5 years. It also predicted no significant benefit from chemotherapy  (4) postmastectomy radiation on the left to be completed 08/16/2014  (5) adjuvant chemotherapy to consist of cyclophosphamide and docetaxel x4 starting 09/19/15  (5) 10 years of antiestrogen therapy to follow with consideration of the PALLAS trial  PLAN: [place] and I spent the entire appointment discussing additions and changes to how she will manage her symptoms: 1. Diarrhea - she will implement the use of imodium as directed every 6 hours and increase her water intake 2. Back pain - flexeril 4m TID and naproxen 5071mBID with food 3. Mucositis - valacyclovir 50063maily, nystatin mouth rinse QID ([facility] will not compound magic mouthwash solution) 4. Heartburn - avoid acidic foods, increase protonix to 63m31mily 5. Hypertension -  stay compliant with HCTZ 12.5mg 49m losartan 50mg 23my, new PCP visit in March with Dr. CrawfoDarrick Meigscall later this week if the above plan is ineffective. She will return in 2 weeks for the start of cycle 2 of treatment. She understands and agrees with this plan. She knows the goal of treatment in her case is cure. She has been encouraged to call with any issues that might arise before her next visit here.   HeatheLaurie Panda 09/26/2015 2:46 PM

## 2015-09-27 ENCOUNTER — Other Ambulatory Visit: Payer: Self-pay | Admitting: Oncology

## 2015-09-30 ENCOUNTER — Other Ambulatory Visit: Payer: Self-pay | Admitting: *Deleted

## 2015-09-30 DIAGNOSIS — C50211 Malignant neoplasm of upper-inner quadrant of right female breast: Secondary | ICD-10-CM

## 2015-09-30 DIAGNOSIS — C50412 Malignant neoplasm of upper-outer quadrant of left female breast: Secondary | ICD-10-CM

## 2015-09-30 MED ORDER — LOSARTAN POTASSIUM 50 MG PO TABS: 50.0000 mg | ORAL_TABLET | Freq: Every day | ORAL | Status: DC

## 2015-10-10 ENCOUNTER — Ambulatory Visit (HOSPITAL_BASED_OUTPATIENT_CLINIC_OR_DEPARTMENT_OTHER): Payer: BLUE CROSS/BLUE SHIELD

## 2015-10-10 ENCOUNTER — Other Ambulatory Visit: Payer: Self-pay | Admitting: Oncology

## 2015-10-10 ENCOUNTER — Encounter: Payer: Self-pay | Admitting: *Deleted

## 2015-10-10 ENCOUNTER — Encounter: Payer: Self-pay | Admitting: Nurse Practitioner

## 2015-10-10 ENCOUNTER — Ambulatory Visit (HOSPITAL_BASED_OUTPATIENT_CLINIC_OR_DEPARTMENT_OTHER): Payer: BLUE CROSS/BLUE SHIELD | Admitting: Nurse Practitioner

## 2015-10-10 ENCOUNTER — Other Ambulatory Visit (HOSPITAL_BASED_OUTPATIENT_CLINIC_OR_DEPARTMENT_OTHER): Payer: BLUE CROSS/BLUE SHIELD

## 2015-10-10 VITALS — BP 156/93 | HR 112 | Temp 98.3°F | Resp 18 | Ht 69.0 in | Wt 287.0 lb

## 2015-10-10 DIAGNOSIS — C50412 Malignant neoplasm of upper-outer quadrant of left female breast: Secondary | ICD-10-CM | POA: Diagnosis not present

## 2015-10-10 DIAGNOSIS — C50211 Malignant neoplasm of upper-inner quadrant of right female breast: Secondary | ICD-10-CM

## 2015-10-10 DIAGNOSIS — C773 Secondary and unspecified malignant neoplasm of axilla and upper limb lymph nodes: Secondary | ICD-10-CM | POA: Diagnosis not present

## 2015-10-10 DIAGNOSIS — Z5111 Encounter for antineoplastic chemotherapy: Secondary | ICD-10-CM | POA: Diagnosis not present

## 2015-10-10 DIAGNOSIS — C50912 Malignant neoplasm of unspecified site of left female breast: Secondary | ICD-10-CM

## 2015-10-10 DIAGNOSIS — C50411 Malignant neoplasm of upper-outer quadrant of right female breast: Secondary | ICD-10-CM

## 2015-10-10 LAB — CBC WITH DIFFERENTIAL/PLATELET
BASO%: 0.1 % (ref 0.0–2.0)
Basophils Absolute: 0 10*3/uL (ref 0.0–0.1)
EOS%: 0 % (ref 0.0–7.0)
Eosinophils Absolute: 0 10*3/uL (ref 0.0–0.5)
HCT: 39.7 % (ref 34.8–46.6)
HGB: 12.8 g/dL (ref 11.6–15.9)
LYMPH%: 6.4 % — ABNORMAL LOW (ref 14.0–49.7)
MCH: 27.9 pg (ref 25.1–34.0)
MCHC: 32.3 g/dL (ref 31.5–36.0)
MCV: 86.4 fL (ref 79.5–101.0)
MONO#: 0.8 10*3/uL (ref 0.1–0.9)
MONO%: 4.5 % (ref 0.0–14.0)
NEUT#: 15.2 10*3/uL — ABNORMAL HIGH (ref 1.5–6.5)
NEUT%: 89 % — ABNORMAL HIGH (ref 38.4–76.8)
Platelets: 225 10*3/uL (ref 145–400)
RBC: 4.59 10*6/uL (ref 3.70–5.45)
RDW: 15.1 % — ABNORMAL HIGH (ref 11.2–14.5)
WBC: 17.1 10*3/uL — ABNORMAL HIGH (ref 3.9–10.3)
lymph#: 1.1 10*3/uL (ref 0.9–3.3)

## 2015-10-10 LAB — COMPREHENSIVE METABOLIC PANEL
ALT: 22 U/L (ref 0–55)
AST: 13 U/L (ref 5–34)
Albumin: 3.7 g/dL (ref 3.5–5.0)
Alkaline Phosphatase: 119 U/L (ref 40–150)
Anion Gap: 12 mEq/L — ABNORMAL HIGH (ref 3–11)
BUN: 19.2 mg/dL (ref 7.0–26.0)
CO2: 21 mEq/L — ABNORMAL LOW (ref 22–29)
Calcium: 9.8 mg/dL (ref 8.4–10.4)
Chloride: 106 mEq/L (ref 98–109)
Creatinine: 1 mg/dL (ref 0.6–1.1)
EGFR: 78 mL/min/{1.73_m2} — ABNORMAL LOW (ref >90)
Glucose: 264 mg/dl — ABNORMAL HIGH (ref 70–140)
Potassium: 3.9 mEq/L (ref 3.5–5.1)
Sodium: 138 mEq/L (ref 136–145)
Total Bilirubin: <0.3 mg/dL (ref 0.20–1.20)
Total Protein: 7.9 g/dL (ref 6.4–8.3)

## 2015-10-10 MED ORDER — SODIUM CHLORIDE 0.9 % IJ SOLN
10.0000 mL | INTRAMUSCULAR | Status: DC | PRN
  Administered 2015-10-10: 10 mL
  Filled 2015-10-10: qty 10

## 2015-10-10 MED ORDER — SODIUM CHLORIDE 0.9 % IV SOLN
Freq: Once | INTRAVENOUS | Status: AC
  Administered 2015-10-10: 15:00:00 via INTRAVENOUS

## 2015-10-10 MED ORDER — PEGFILGRASTIM 6 MG/0.6ML ~~LOC~~ PSKT
6.0000 mg | PREFILLED_SYRINGE | Freq: Once | SUBCUTANEOUS | Status: AC
  Administered 2015-10-10: 6 mg via SUBCUTANEOUS
  Filled 2015-10-10: qty 0.6

## 2015-10-10 MED ORDER — HEPARIN SOD (PORK) LOCK FLUSH 100 UNIT/ML IV SOLN
500.0000 [IU] | Freq: Once | INTRAVENOUS | Status: AC | PRN
  Administered 2015-10-10: 500 [IU]
  Filled 2015-10-10: qty 5

## 2015-10-10 MED ORDER — CYCLOPHOSPHAMIDE CHEMO INJECTION 1 GM
600.0000 mg/m2 | Freq: Once | INTRAMUSCULAR | Status: AC
  Administered 2015-10-10: 1500 mg via INTRAVENOUS
  Filled 2015-10-10: qty 75

## 2015-10-10 MED ORDER — SODIUM CHLORIDE 0.9 % IV SOLN
Freq: Once | INTRAVENOUS | Status: AC
  Administered 2015-10-10: 15:00:00 via INTRAVENOUS
  Filled 2015-10-10: qty 8

## 2015-10-10 MED ORDER — SODIUM CHLORIDE 0.9 % IV SOLN
75.0000 mg/m2 | Freq: Once | INTRAVENOUS | Status: AC
  Administered 2015-10-10: 190 mg via INTRAVENOUS
  Filled 2015-10-10: qty 19

## 2015-10-10 NOTE — Progress Notes (Signed)
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 She has been receiving medical care through the emergency room and was seen in March 2015 following a head injury, then in August 2015 for lancing of an abscess. In December 2015 she noticed that her left breast looked a little bit smaller than her right. She brought this to the attention of friends and family over the next several months but the general feeling was that most people are not perfectly symmetrical. By the summer of this year she noticed that her nipple on the left was "going in". More recently she started developing pain in the left breast. She was evaluated for this in the emergency room 04/09/2015 at which time a left breast exam showed a deformed left breast with a large hard mass encompassing most of the breast, with nipple retraction. There was no nipple discharge or bleeding and no palpable adenopathy.  The patient was referred to [facility] and on 04/13/2015 she underwent bilateral diagnostic mammography with tomosynthesis as well as bilateral breast ultrasound. The breast density was category B. In the right breast at the 11:00 position there was a 1.3 cm irregular  mass which by ultrasonography measured 1.3 cm.--In the left breast there was a 4 cm irregular mass at the 2:00 position associated with nipple retraction. There was a second, 1.7 cm area of architectural distortion at the 9:00 position. Both were palpable. By ultrasonography, the 4 cm irregular mass was noted, with a second mass measuring 2.5 cm by ultrasonography. Both axillae were benign.  On 04/19/2015 the patient underwent right breast upper outer quadrant biopsy, showing ([id number]) and invasive ductal carcinoma, grade 1, estrogen receptor 90% positive, progesterone receptor negative, with an MIB-1 of 5%, and no HER-2 amplification, the signals ratio being 1.26 and the number per cell 2.20.  On the same day, she underwent biopsy of the 2 left breast masses in question as well as a suspicious left axillary lymph node. All 3 biopsies showed invasive ductal carcinoma, grade 2, estrogen receptor 80-100% positive, progesterone receptor 80-100% positive, with the MIB-1 between 5 and 10%, and no HER-2 amplification, the signals ratio being between 1.05 and 1.13, and the number per cell between 2.10 and 2.25.  The patient's subsequent history is as detailed below  INTERVAL HISTORY: Margaret [name] returns today for follow-up of her bilateral breast cancer. Today is day 1, cycle 2 of cyclophosphamide and docetaxel, with neulasta given on day 2 of granulocyte support.   REVIEW OF SYSTEMS: [name] feels back to baseline today. She is having no more loose stools. Her mucositis has resolved with use of valtrex and nystatin. She is not having any more heartburn with the increased dose of protonix. She denies fevers, chills, nausea, or vomiting. Her appetite is good. She denies peripheral neuropathy symptoms. A  detailed review of systems is otherwise stable.  PAST MEDICAL HISTORY: Past Medical History  Diagnosis Date  . Hypertension   . Breast cancer of upper-outer quadrant of left female breast ([place]) 04/21/2015  .  Breast cancer ([place])   . GERD (gastroesophageal reflux disease)   . Bilateral breast cancer ([place])     PAST SURGICAL HISTORY: Past Surgical History  Procedure Laterality Date  . Abdominal hysterectomy  1990  . Tubal ligation  1984  . Mastectomy w/ sentinel node biopsy Right   . Mastectomy modified radical Left 05/09/2015  . Mastectomy w/ sentinel node biopsy Right 05/09/2015    Procedure: RIGHT MASTECTOMY WITH RIGHT AXILLARY SENTINEL LYMPH NODE BIOPSY;  Surgeon: Autumn Messing III, MD;  Location: [facility];  Service: General;  Laterality: Right;  . Mastectomy modified radical Left 05/09/2015    Procedure: LEFT MODIFIED RADICAL MASTETCTOMY;  Surgeon: Autumn Messing III, MD;  Location: [facility];  Service: General;  Laterality: Left;  . Portacath placement Right 09/01/2015    Procedure: INSERTION PORT-A-CATH;  Surgeon: Autumn Messing III, MD;  Location: [place];  Service: General;  Laterality: Right;    FAMILY HISTORY Family History  Problem Relation Age of Onset  . Hypertension Mother   . Diabetes Mother     the patient has little information on her father. Her mother is living, currently age 62. She has one brother, 2 sisters. There is no history of breast or ovarian cancer in the family to her knowledge.   GYNECOLOGIC HISTORY:  No LMP recorded. Patient has had a hysterectomy.  menarche age 76, first live birth age 38. She is GX P4. She underwent a total abdominal hysterectomy with bilateral salpingo-oophorectomy at age 89. She did not take hormone replacement. She never used oral contraceptives.   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 She is not employed by [employer] but by a Set designer. She is single and lives with her significant other [name]. He is status post renal transplant. The patient's daughter  Margaret [name] works as a Programmer, applications, as does her daughter  Margaret [name]. Daughter Margaret [name] works at [employer], as a call. All 3 live in [place]. The patient's son Margaret [name]'s lives in [place]. He prepares sets for shows The patient has 4 grandchildren, no great grandchildren. She attends a [employer] in [place].     ADVANCED DIRECTIVES: Not in place. At the initial clinic visit the patient was given the appropriate forms to complete and notarize at her discretion. The patient intends to name her daughter Sampson Si as healthcare power of attorney. She can be reached at [phone].  HEALTH MAINTENANCE: Social History  Substance Use Topics  . Smoking status: Former Smoker -- 0.10 packs/day for 28 years  . Smokeless tobacco: Never Used  . Alcohol Use: No     Colonoscopy: Never  PAP: Status post hysterectomy  Bone density: Never  Lipid panel:  Allergies  Allergen Reactions  . Latex Itching and Other (See Comments)    burning  . 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 Wheat Bran Hives    Current Outpatient Prescriptions  Medication Sig Dispense Refill  . acetaminophen (TYLENOL) 500 MG tablet Take 1,000 mg by mouth every 6 (six) hours as needed for mild pain. Reported on 08/15/2015    . cyclobenzaprine (FLEXERIL) 5 MG tablet Take  1 tablet (5 mg total) by mouth 3 (three) times daily as needed for muscle spasms. 30 tablet 0  . dexamethasone (DECADRON) 4 MG tablet Take 2 tablets (8 mg total) by mouth 2 (two) times daily. Start the day before Taxotere. Then again the day after chemo for 3 days. 30 tablet 1  . gabapentin (NEURONTIN) 300 MG capsule TAKE ONE CAPSULE BY MOUTH 3 TIMES A DAY 90 capsule 2  . hydrochlorothiazide (HYDRODIURIL) 12.5 MG tablet Take 1 tablet (12.5 mg total) by mouth daily. 30 tablet 3  . ibuprofen (ADVIL,MOTRIN) 200 MG tablet Take 800-1,000 mg by mouth 3 (three) times daily as needed for headache. Reported on 09/15/2015    . lidocaine-prilocaine (EMLA) cream Apply to affected area once 30 g 3  . LORazepam (ATIVAN)  0.5 MG tablet Take 1 tablet (0.5 mg total) by mouth at bedtime as needed (Nausea or vomiting). 30 tablet 0  . losartan (COZAAR) 50 MG tablet Take 1 tablet (50 mg total) by mouth daily. 30 tablet 1  . naproxen (NAPROSYN) 500 MG tablet Take 1 tablet (500 mg total) by mouth 2 (two) times daily with a meal. 60 tablet 1  . pantoprazole (PROTONIX) 40 MG tablet Take 1 tablet (40 mg total) by mouth daily. 30 tablet 1  . valACYclovir (VALTREX) 500 MG tablet Take 1 tablet (500 mg total) by mouth daily. 30 tablet 1  . venlafaxine XR (EFFEXOR XR) 75 MG 24 hr capsule Take 1 capsule (75 mg total) by mouth daily with breakfast. 90 capsule 2  . nystatin (MYCOSTATIN) 100000 UNIT/ML suspension Take 5 mLs (500,000 Units total) by mouth 4 (four) times daily. (Patient not taking: Reported on 10/10/2015) 240 mL 0  . ondansetron (ZOFRAN) 8 MG tablet Take 1 tablet (8 mg total) by mouth 2 (two) times daily. Start the day after chemo for 3 days. Then take as needed for nausea or vomiting. (Patient not taking: Reported on 10/10/2015) 30 tablet 1  . oxyCODONE-acetaminophen (ROXICET) 5-325 MG tablet Take 1-2 tablets by mouth every 4 (four) hours as needed. (Patient not taking: Reported on 09/15/2015) 30 tablet 0  . prochlorperazine (COMPAZINE) 10 MG tablet Take 1 tablet (10 mg total) by mouth every 6 (six) hours as needed (Nausea or vomiting). (Patient not taking: Reported on 10/10/2015) 30 tablet 1   No current facility-administered medications for this visit.    OBJECTIVE: Middle-aged African-American woman who appears stated age 75 Vitals:   10/10/15 1342  BP: 156/93  Pulse: 112  Temp: 98.3 F (36.8 C)  Resp: 18     Body mass index is 42.36 kg/(m^2).    ECOG FS:1 - Symptomatic but completely ambulatory  Skin: warm, dry  HEENT: sclerae anicteric, conjunctivae pink, oropharynx clear. No thrush or mucositis.  Lymph Nodes: No cervical or supraclavicular lymphadenopathy  Lungs: clear to auscultation bilaterally, no rales,  wheezes, or rhonci  Heart: regular rate and rhythm  Abdomen: round, soft, non tender, positive bowel sounds  Musculoskeletal: No focal spinal tenderness, no peripheral edema  Neuro: non focal, well oriented, positive affect  Breasts: deferred  LAB RESULTS:  CMP     Component Value Date/Time   NA 138 10/10/2015 1320   NA 136 06/29/2015 0923   K 3.9 10/10/2015 1320   K 4.0 06/29/2015 0923   CL 102 06/29/2015 0923   CO2 21* 10/10/2015 1320   CO2 24 06/29/2015 0923   GLUCOSE 264* 10/10/2015 1320   GLUCOSE 107* 06/29/2015 0923   BUN 19.2 10/10/2015 1320  BUN 7 06/29/2015 0923   CREATININE 1.0 10/10/2015 1320   CREATININE 0.84 06/29/2015 0923   CALCIUM 9.8 10/10/2015 1320   CALCIUM 9.2 06/29/2015 0923   PROT 7.9 10/10/2015 1320   PROT 7.8 06/29/2015 0923   ALBUMIN 3.7 10/10/2015 1320   ALBUMIN 3.5 06/29/2015 0923   AST 13 10/10/2015 1320   AST 20 06/29/2015 0923   ALT 22 10/10/2015 1320   ALT 21 06/29/2015 0923   ALKPHOS 119 10/10/2015 1320   ALKPHOS 87 06/29/2015 0923   BILITOT <0.30 10/10/2015 1320   BILITOT 1.0 06/29/2015 0923   GFRNONAA >60 06/29/2015 0923   GFRAA >60 06/29/2015 0923    INo results found for: SPEP, UPEP  Lab Results  Component Value Date   WBC 17.1* 10/10/2015   NEUTROABS 15.2* 10/10/2015   HGB 12.8 10/10/2015   HCT 39.7 10/10/2015   MCV 86.4 10/10/2015   PLT 225 10/10/2015      Chemistry      Component Value Date/Time   NA 138 10/10/2015 1320   NA 136 06/29/2015 0923   K 3.9 10/10/2015 1320   K 4.0 06/29/2015 0923   CL 102 06/29/2015 0923   CO2 21* 10/10/2015 1320   CO2 24 06/29/2015 0923   BUN 19.2 10/10/2015 1320   BUN 7 06/29/2015 0923   CREATININE 1.0 10/10/2015 1320   CREATININE 0.84 06/29/2015 0923      Component Value Date/Time   CALCIUM 9.8 10/10/2015 1320   CALCIUM 9.2 06/29/2015 0923   ALKPHOS 119 10/10/2015 1320   ALKPHOS 87 06/29/2015 0923   AST 13 10/10/2015 1320   AST 20 06/29/2015 0923   ALT 22 10/10/2015  1320   ALT 21 06/29/2015 0923   BILITOT <0.30 10/10/2015 1320   BILITOT 1.0 06/29/2015 0923       No results found for: LABCA2  No components found for: [id number]  No results for input(s): INR in the last 168 hours.  Urinalysis    Component Value Date/Time   COLORURINE YELLOW 06/29/2015 1030   APPEARANCEUR CLOUDY* 06/29/2015 1030   LABSPEC 1.019 06/29/2015 1030   PHURINE 7.0 06/29/2015 1030   GLUCOSEU NEGATIVE 06/29/2015 1030   HGBUR TRACE* 06/29/2015 1030   BILIRUBINUR NEGATIVE 06/29/2015 1030   KETONESUR NEGATIVE 06/29/2015 1030   PROTEINUR NEGATIVE 06/29/2015 1030   NITRITE NEGATIVE 06/29/2015 1030   LEUKOCYTESUR MODERATE* 06/29/2015 1030    STUDIES: No results found.   ASSESSMENT: 55 y.o. [place] woman with synchronous breast cancers, as follows  (a) status post right breast upper outer quadrant biopsy 04/19/2015 for a clinical pT1c N0, stage IA invasive ductal carcinoma, grade 1, estrogen receptor positive, progesterone receptor negative, with an MIB-1 of 5%, and no HER-2 amplification  (b) status post left breast biopsy 2 and left axillary lymph node biopsy 04/19/2015 for a clinical T3 N1, stage IIIA invasive ductal carcinoma, grade 1 or 2, estrogen receptor and progesterone receptor positive, HER-2 negative, with an MIB-1 between 5 and 10%  (1) status post bilateral mastectomies 05/09/2015, showing:   (a) on the right side, a pT2 pN0, stage IIA invasive ductal carcinoma, with negative margins and repeat HER-2 again negative    (b) on the left, a pT3 pN2, stage IIIA invasive ductal carcinoma, grade 2, with negative margins, and repeat HER-2 again negative  (2) Mammaprint from the left-sided tumor returned "luminal type, low risk", predicting a small benefit from chemotherapy with the important caveat that N2 disease was not included in the MINDACT study  (  3) Oncotype from the right-sided tumor showed a score of 12, predicting an 8% risk of recurrence outside the  breast within the next 10 years, if the patient's only systemic therapy was tamoxifen for 5 years. It also predicted no significant benefit from chemotherapy  (4) postmastectomy radiation on the left to be completed 08/16/2014  (5) adjuvant chemotherapy to consist of cyclophosphamide and docetaxel x4 starting 09/19/15  (5) 10 years of antiestrogen therapy to follow with consideration of the PALLAS trial  PLAN: Mashonda and I reviewed the symptom management techniques that we discussed at our last visit. I feel confident this treatment will go smoother than the first. The labs were reviewed in detail and were stable. She will proceed with cycle 2 of cyclophosphamide and docetaxel as planned today.   Ainara will return in 1 week for labs and a nadir visit. She understands and agrees with this plan. She knows the goal of treatment in her case is cure. She has been encouraged to call with any issues that might arise before her next visit here.   Laurie Panda, NP   10/10/2015 2:12 PM

## 2015-10-10 NOTE — Patient Instructions (Addendum)
[facility] Discharge Instructions for Patients Receiving Chemotherapy  Today you received the following chemotherapy agents:  Cytoxan, Taxotere.  To help prevent nausea and vomiting after your treatment, we encourage you to take your nausea medication as prescribed.   If you develop nausea and vomiting that is not controlled by your nausea medication, call the clinic.   BELOW ARE SYMPTOMS THAT SHOULD BE REPORTED IMMEDIATELY:  *FEVER GREATER THAN 100.5 F  *CHILLS WITH OR WITHOUT FEVER  NAUSEA AND VOMITING THAT IS NOT CONTROLLED WITH YOUR NAUSEA MEDICATION  *UNUSUAL SHORTNESS OF BREATH  *UNUSUAL BRUISING OR BLEEDING  TENDERNESS IN MOUTH AND THROAT WITH OR WITHOUT PRESENCE OF ULCERS  *URINARY PROBLEMS  *BOWEL PROBLEMS  UNUSUAL RASH Items with * indicate a potential emergency and should be followed up as soon as possible.  Feel free to call the clinic you have any questions or concerns. The clinic phone number is [phone].  Please show the [facility] at check-in to the Emergency Department and triage nurse.

## 2015-10-11 ENCOUNTER — Telehealth: Payer: Self-pay | Admitting: Nurse Practitioner

## 2015-10-11 NOTE — Telephone Encounter (Signed)
Added appt per 3/6 pof. Pt will get updated schedule upon next visit

## 2015-10-17 ENCOUNTER — Encounter: Payer: Self-pay | Admitting: Nurse Practitioner

## 2015-10-17 ENCOUNTER — Ambulatory Visit (HOSPITAL_BASED_OUTPATIENT_CLINIC_OR_DEPARTMENT_OTHER): Payer: BLUE CROSS/BLUE SHIELD | Admitting: Nurse Practitioner

## 2015-10-17 ENCOUNTER — Telehealth: Payer: Self-pay | Admitting: Nurse Practitioner

## 2015-10-17 ENCOUNTER — Other Ambulatory Visit (HOSPITAL_BASED_OUTPATIENT_CLINIC_OR_DEPARTMENT_OTHER): Payer: BLUE CROSS/BLUE SHIELD

## 2015-10-17 VITALS — BP 144/85 | HR 87 | Temp 98.4°F | Resp 19 | Ht 69.0 in | Wt 286.6 lb

## 2015-10-17 DIAGNOSIS — K6289 Other specified diseases of anus and rectum: Secondary | ICD-10-CM

## 2015-10-17 DIAGNOSIS — C50411 Malignant neoplasm of upper-outer quadrant of right female breast: Secondary | ICD-10-CM | POA: Diagnosis not present

## 2015-10-17 DIAGNOSIS — C773 Secondary and unspecified malignant neoplasm of axilla and upper limb lymph nodes: Secondary | ICD-10-CM | POA: Diagnosis not present

## 2015-10-17 DIAGNOSIS — K1230 Oral mucositis (ulcerative), unspecified: Secondary | ICD-10-CM

## 2015-10-17 DIAGNOSIS — C50912 Malignant neoplasm of unspecified site of left female breast: Secondary | ICD-10-CM | POA: Diagnosis not present

## 2015-10-17 DIAGNOSIS — M79602 Pain in left arm: Secondary | ICD-10-CM | POA: Diagnosis not present

## 2015-10-17 DIAGNOSIS — C50412 Malignant neoplasm of upper-outer quadrant of left female breast: Secondary | ICD-10-CM

## 2015-10-17 DIAGNOSIS — R197 Diarrhea, unspecified: Secondary | ICD-10-CM

## 2015-10-17 DIAGNOSIS — M7989 Other specified soft tissue disorders: Secondary | ICD-10-CM

## 2015-10-17 DIAGNOSIS — C50211 Malignant neoplasm of upper-inner quadrant of right female breast: Secondary | ICD-10-CM

## 2015-10-17 LAB — COMPREHENSIVE METABOLIC PANEL
ALT: 20 U/L (ref 0–55)
AST: 15 U/L (ref 5–34)
Albumin: 3.5 g/dL (ref 3.5–5.0)
Alkaline Phosphatase: 138 U/L (ref 40–150)
Anion Gap: 9 mEq/L (ref 3–11)
BUN: 17.6 mg/dL (ref 7.0–26.0)
CO2: 27 mEq/L (ref 22–29)
Calcium: 9.1 mg/dL (ref 8.4–10.4)
Chloride: 105 mEq/L (ref 98–109)
Creatinine: 0.9 mg/dL (ref 0.6–1.1)
EGFR: 88 mL/min/{1.73_m2} — ABNORMAL LOW (ref >90)
Glucose: 96 mg/dl (ref 70–140)
Potassium: 3.4 mEq/L — ABNORMAL LOW (ref 3.5–5.1)
Sodium: 141 mEq/L (ref 136–145)
Total Bilirubin: 0.32 mg/dL (ref 0.20–1.20)
Total Protein: 7.3 g/dL (ref 6.4–8.3)

## 2015-10-17 LAB — CBC WITH DIFFERENTIAL/PLATELET
BASO%: 0.7 % (ref 0.0–2.0)
Basophils Absolute: 0.1 10*3/uL (ref 0.0–0.1)
EOS%: 0.8 % (ref 0.0–7.0)
Eosinophils Absolute: 0.1 10*3/uL (ref 0.0–0.5)
HCT: 40.7 % (ref 34.8–46.6)
HGB: 13.3 g/dL (ref 11.6–15.9)
LYMPH%: 23.9 % (ref 14.0–49.7)
MCH: 28.7 pg (ref 25.1–34.0)
MCHC: 32.8 g/dL (ref 31.5–36.0)
MCV: 87.5 fL (ref 79.5–101.0)
MONO#: 1 10*3/uL — ABNORMAL HIGH (ref 0.1–0.9)
MONO%: 12.5 % (ref 0.0–14.0)
NEUT#: 5 10*3/uL (ref 1.5–6.5)
NEUT%: 62.1 % (ref 38.4–76.8)
Platelets: 236 10*3/uL (ref 145–400)
RBC: 4.65 10*6/uL (ref 3.70–5.45)
RDW: 15.4 % — ABNORMAL HIGH (ref 11.2–14.5)
WBC: 8.1 10*3/uL (ref 3.9–10.3)
lymph#: 1.9 10*3/uL (ref 0.9–3.3)

## 2015-10-17 MED ORDER — VALACYCLOVIR HCL 500 MG PO TABS: 500.0000 mg | ORAL_TABLET | Freq: Every day | ORAL | Status: DC

## 2015-10-17 MED ORDER — CYCLOBENZAPRINE HCL 5 MG PO TABS: 5.0000 mg | ORAL_TABLET | Freq: Three times a day (TID) | ORAL | Status: DC | PRN

## 2015-10-17 NOTE — Telephone Encounter (Signed)
appt made and avs printed. Doppler sch for 3/14 at 9 am. Referral to rehab to be scheduled

## 2015-10-17 NOTE — Progress Notes (Signed)
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 She has been receiving medical care through the emergency room and was seen in March 2015 following a head injury, then in August 2015 for lancing of an abscess. In December 2015 she noticed that her left breast looked a little bit smaller than her right. She brought this to the attention of friends and family over the next several months but the general feeling was that most people are not perfectly symmetrical. By the summer of this year she noticed that her nipple on the left was "going in". More recently she started developing pain in the left breast. She was evaluated for this in the emergency room 04/09/2015 at which time a left breast exam showed a deformed left breast with a large hard mass encompassing most of the breast, with nipple retraction. There was no nipple discharge or bleeding and no palpable adenopathy.  The patient was referred to [facility] and on 04/13/2015 she underwent bilateral diagnostic mammography with tomosynthesis as well as bilateral breast ultrasound. The breast density was category B. In the right breast at the 11:00 position there was a 1.3 cm irregular  mass which by ultrasonography measured 1.3 cm.--In the left breast there was a 4 cm irregular mass at the 2:00 position associated with nipple retraction. There was a second, 1.7 cm area of architectural distortion at the 9:00 position. Both were palpable. By ultrasonography, the 4 cm irregular mass was noted, with a second mass measuring 2.5 cm by ultrasonography. Both axillae were benign.  On 04/19/2015 the patient underwent right breast upper outer quadrant biopsy, showing ([id number]) and invasive ductal carcinoma, grade 1, estrogen receptor 90% positive, progesterone receptor negative, with an MIB-1 of 5%, and no HER-2 amplification, the signals ratio being 1.26 and the number per cell 2.20.  On the same day, she underwent biopsy of the 2 left breast masses in question as well as a suspicious left axillary lymph node. All 3 biopsies showed invasive ductal carcinoma, grade 2, estrogen receptor 80-100% positive, progesterone receptor 80-100% positive, with the MIB-1 between 5 and 10%, and no HER-2 amplification, the signals ratio being between 1.05 and 1.13, and the number per cell between 2.10 and 2.25.  The patient's subsequent history is as detailed below  INTERVAL HISTORY: Margaret [name] returns today for follow-up of her bilateral breast cancer. Today is day 8, cycle 2 of cyclophosphamide and docetaxel, with neulasta given on day 2 of granulocyte support.   REVIEW OF SYSTEMS: Margaret [name] denies fevers, chills, nausea, or vomiting. Her tongue is tender but there are no true sores found. She is on valacyclovir and nystatin rinses daily. She had more diarrhea with treatment and finally began the imodium. Her rectum is raw and irritated however, and she has seen some blood on the tissue  when she wipes, but she denies hemorrhoids. Her left upper arm is now swollen and tender for the past few days. She has been performing some mild stretching exercises, but the arm is heavy now and her rings barely fit. She has  been taking naproxen, ibuprofen, and flexeril for pain. She denies peripheral neuropathy symptoms. A detailed review of systems is otherwise stable.  PAST MEDICAL HISTORY: Past Medical History  Diagnosis Date  . Hypertension   . Breast cancer of upper-outer quadrant of left female breast ([place]) 04/21/2015  . Breast cancer ([place])   . GERD (gastroesophageal reflux disease)   . Bilateral breast cancer ([place])     PAST SURGICAL HISTORY: Past Surgical History  Procedure Laterality Date  . Abdominal hysterectomy  1990  . Tubal ligation  1984  . Mastectomy w/ sentinel node biopsy Right   . Mastectomy modified radical Left 05/09/2015  . Mastectomy w/ sentinel node biopsy Right 05/09/2015    Procedure: RIGHT MASTECTOMY WITH RIGHT AXILLARY SENTINEL LYMPH NODE BIOPSY;  Surgeon: Autumn Messing III, MD;  Location: [facility];  Service: General;  Laterality: Right;  . Mastectomy modified radical Left 05/09/2015    Procedure: LEFT MODIFIED RADICAL MASTETCTOMY;  Surgeon: Autumn Messing III, MD;  Location: [facility];  Service: General;  Laterality: Left;  . Portacath placement Right 09/01/2015    Procedure: INSERTION PORT-A-CATH;  Surgeon: Autumn Messing III, MD;  Location: [facility];  Service: General;  Laterality: Right;    FAMILY HISTORY Family History  Problem Relation Age of Onset  . Hypertension Mother   . Diabetes Mother     the patient has little information on her father. Her mother is living, currently age 64. She has one brother, 2 sisters. There is no history of breast or ovarian cancer in the family to her knowledge.   GYNECOLOGIC HISTORY:  No LMP recorded. Patient has had a hysterectomy.  menarche age 54, first live birth age 56. She is GX P4. She underwent a total abdominal hysterectomy with bilateral salpingo-oophorectomy at age 35. She did not take hormone replacement. She never used oral contraceptives.   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 She is not employed by [employer] but by a Set designer. She is single and lives with her significant other Margaret [name]. He is status post renal transplant. The patient's daughter  Margaret [name] works as a Programmer, applications, as does her daughter  Margaret [name]. Daughter Margaret [name] works at [employer], as a call. All 3 live in [place]. The patient's son Margaret [name] lives in [place]. He prepares sets for shows The patient has 4 grandchildren, no great grandchildren. She attends a [employer] in [place].     ADVANCED DIRECTIVES: Not in place. At the initial clinic visit the patient was given the appropriate forms to complete and notarize at her discretion. The patient intends to name her daughter Margaret [name] as healthcare power of attorney. She can be reached at [phone].  HEALTH MAINTENANCE: Social History  Substance Use Topics  . Smoking status: Former Smoker -- 0.10 packs/day for 28 years  . Smokeless tobacco: Never Used  . Alcohol Use: No     Colonoscopy: Never  PAP: Status post hysterectomy  Bone density: Never  Lipid panel:  Allergies  Allergen Reactions  . Latex Itching and Other (See Comments)    burning  . 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 Wheat Bran Hives    Current Outpatient Prescriptions  Medication Sig Dispense Refill  . gabapentin (NEURONTIN) 300 MG capsule TAKE ONE CAPSULE BY MOUTH 3 TIMES A DAY 90 capsule 2  . hydrochlorothiazide (HYDRODIURIL) 12.5 MG tablet Take 1 tablet (12.5 mg total) by mouth daily. 30 tablet 3  . ibuprofen (ADVIL,MOTRIN) 200 MG tablet Take 800-1,000 mg by mouth 3 (three) times daily as needed for headache. Reported on 09/15/2015    . LORazepam (ATIVAN) 0.5 MG tablet Take 1 tablet (0.5 mg total) by mouth at bedtime as needed (Nausea or vomiting). 30 tablet 0  . losartan (COZAAR) 50 MG tablet Take 1 tablet (50 mg total) by mouth daily. 30 tablet 1  . naproxen  (NAPROSYN) 500 MG tablet Take 1 tablet (500 mg total) by mouth 2 (two) times daily with a meal. 60 tablet 1  . pantoprazole (PROTONIX) 40 MG tablet Take 1 tablet (40 mg total) by mouth daily. 30 tablet 1  . valACYclovir (VALTREX) 500 MG tablet Take 1 tablet (500 mg total) by mouth daily. 30 tablet 1  . venlafaxine XR (EFFEXOR XR) 75 MG 24 hr capsule Take 1 capsule (75 mg total) by mouth daily with breakfast. 90 capsule 2  . acetaminophen (TYLENOL) 500 MG tablet Take 1,000 mg by mouth every 6 (six) hours as needed for mild pain. Reported on 10/17/2015    . cyclobenzaprine (FLEXERIL) 5 MG tablet Take 1 tablet (5 mg total) by mouth 3 (three) times daily as needed for muscle spasms. 30 tablet 0  . dexamethasone (DECADRON) 4 MG tablet Take 2 tablets (8 mg total) by mouth 2 (two) times daily. Start the day before Taxotere. Then again the day after chemo for 3 days. (Patient not taking: Reported on 10/17/2015) 30 tablet 1  . lidocaine-prilocaine (EMLA) cream Apply to affected area once (Patient not taking: Reported on 10/17/2015) 30 g 3  . nystatin (MYCOSTATIN) 100000 UNIT/ML suspension Take 5 mLs (500,000 Units total) by mouth 4 (four) times daily. (Patient not taking: Reported on 10/10/2015) 240 mL 0  . ondansetron (ZOFRAN) 8 MG tablet Take 1 tablet (8 mg total) by mouth 2 (two) times daily. Start the day after chemo for 3 days. Then take as needed for nausea or vomiting. (Patient not taking: Reported on 10/10/2015) 30 tablet 1  . oxyCODONE-acetaminophen (ROXICET) 5-325 MG tablet Take 1-2 tablets by mouth every 4 (four) hours as needed. (Patient not taking: Reported on 09/15/2015) 30 tablet 0  . prochlorperazine (COMPAZINE) 10 MG tablet Take 1 tablet (10 mg total) by mouth every 6 (six) hours as needed (Nausea or vomiting). (Patient not taking: Reported on 10/10/2015) 30 tablet 1   No current facility-administered medications for this visit.    OBJECTIVE: Middle-aged African-American woman who appears stated  age 55 Vitals:   10/17/15 1332  BP: 144/85  Pulse: 87  Temp: 98.4 F (36.9 C)  Resp: 19     Body mass index is 42.3 kg/(m^2).    ECOG FS:1 - Symptomatic but completely ambulatory   Sclerae unicteric, pupils round and equal Oropharynx clear and moist-- no thrush or other lesions No cervical or supraclavicular adenopathy Lungs no rales or rhonchi Heart regular rate and rhythm Abd soft, nontender, positive bowel sounds MSK no focal spinal tenderness, left upper arm edema and tenderness Neuro: nonfocal, well oriented, appropriate affect Breasts:   LAB RESULTS:  CMP     Component Value Date/Time   NA 141 10/17/2015  1306   NA 136 06/29/2015 0923   K 3.4* 10/17/2015 1306   K 4.0 06/29/2015 0923   CL 102 06/29/2015 0923   CO2 27 10/17/2015 1306   CO2 24 06/29/2015 0923   GLUCOSE 96 10/17/2015 1306   GLUCOSE 107* 06/29/2015 0923   BUN 17.6 10/17/2015 1306   BUN 7 06/29/2015 0923   CREATININE 0.9 10/17/2015 1306   CREATININE 0.84 06/29/2015 0923   CALCIUM 9.1 10/17/2015 1306   CALCIUM 9.2 06/29/2015 0923   PROT 7.3 10/17/2015 1306   PROT 7.8 06/29/2015 0923   ALBUMIN 3.5 10/17/2015 1306   ALBUMIN 3.5 06/29/2015 0923   AST 15 10/17/2015 1306   AST 20 06/29/2015 0923   ALT 20 10/17/2015 1306   ALT 21 06/29/2015 0923   ALKPHOS 138 10/17/2015 1306   ALKPHOS 87 06/29/2015 0923   BILITOT 0.32 10/17/2015 1306   BILITOT 1.0 06/29/2015 0923   GFRNONAA >60 06/29/2015 0923   GFRAA >60 06/29/2015 0923    INo results found for: SPEP, UPEP  Lab Results  Component Value Date   WBC 8.1 10/17/2015   NEUTROABS 5.0 10/17/2015   HGB 13.3 10/17/2015   HCT 40.7 10/17/2015   MCV 87.5 10/17/2015   PLT 236 10/17/2015      Chemistry      Component Value Date/Time   NA 141 10/17/2015 1306   NA 136 06/29/2015 0923   K 3.4* 10/17/2015 1306   K 4.0 06/29/2015 0923   CL 102 06/29/2015 0923   CO2 27 10/17/2015 1306   CO2 24 06/29/2015 0923   BUN 17.6 10/17/2015 1306   BUN 7  06/29/2015 0923   CREATININE 0.9 10/17/2015 1306   CREATININE 0.84 06/29/2015 0923      Component Value Date/Time   CALCIUM 9.1 10/17/2015 1306   CALCIUM 9.2 06/29/2015 0923   ALKPHOS 138 10/17/2015 1306   ALKPHOS 87 06/29/2015 0923   AST 15 10/17/2015 1306   AST 20 06/29/2015 0923   ALT 20 10/17/2015 1306   ALT 21 06/29/2015 0923   BILITOT 0.32 10/17/2015 1306   BILITOT 1.0 06/29/2015 0923       No results found for: LABCA2  No components found for: [id number]  No results for input(s): INR in the last 168 hours.  Urinalysis    Component Value Date/Time   COLORURINE YELLOW 06/29/2015 1030   APPEARANCEUR CLOUDY* 06/29/2015 1030   LABSPEC 1.019 06/29/2015 1030   PHURINE 7.0 06/29/2015 1030   GLUCOSEU NEGATIVE 06/29/2015 1030   HGBUR TRACE* 06/29/2015 1030   BILIRUBINUR NEGATIVE 06/29/2015 1030   KETONESUR NEGATIVE 06/29/2015 1030   PROTEINUR NEGATIVE 06/29/2015 1030   NITRITE NEGATIVE 06/29/2015 1030   LEUKOCYTESUR MODERATE* 06/29/2015 1030    STUDIES: No results found.   ASSESSMENT: 55 y.o. Margaret [name] woman with synchronous breast cancers, as follows  (a) status post right breast upper outer quadrant biopsy 04/19/2015 for a clinical pT1c N0, stage IA invasive ductal carcinoma, grade 1, estrogen receptor positive, progesterone receptor negative, with an MIB-1 of 5%, and no HER-2 amplification  (b) status post left breast biopsy 2 and left axillary lymph node biopsy 04/19/2015 for a clinical T3 N1, stage IIIA invasive ductal carcinoma, grade 1 or 2, estrogen receptor and progesterone receptor positive, HER-2 negative, with an MIB-1 between 5 and 10%  (1) status post bilateral mastectomies 05/09/2015, showing:   (a) on the right side, a pT2 pN0, stage IIA invasive ductal carcinoma, with negative margins and repeat HER-2 again negative    (  b) on the left, a pT3 pN2, stage IIIA invasive ductal carcinoma, grade 2, with negative margins, and repeat HER-2 again  negative  (2) Mammaprint from the left-sided tumor returned "luminal type, low risk", predicting a small benefit from chemotherapy with the important caveat that N2 disease was not included in the MINDACT study  (3) Oncotype from the right-sided tumor showed a score of 12, predicting an 8% risk of recurrence outside the breast within the next 10 years, if the patient's only systemic therapy was tamoxifen for 5 years. It also predicted no significant benefit from chemotherapy  (4) postmastectomy radiation on the left to be completed 08/16/2014  (5) adjuvant chemotherapy to consist of cyclophosphamide and docetaxel x4 starting 09/19/15  (5) 10 years of antiestrogen therapy to follow with consideration of the PALLAS trial  PLAN: I am writing orders for a left upper arm ultrasound to rule out a DVT. If this is negative, she will proceed with the physical therapy referral that I have also placed during this visit to evaluate her for lymphedema. I reminded her that naproxen and ibuprofen are in the same drug class and that she should only be using one at a time moving forward. She can also use the flexeril she has been prescribed which has been helpful.   The labs were reviewed in detail and were stable. I have refilled her valacyclovir and she will continue this along with the nystatin for her mucositis. She will use witch hazel or a preparation H cream to her rectum for the irritation, as well as imodium PRN.  Margaret [name] will return in 2 weeks for cycle 3 of treatment. She understands and agrees with this plan. She knows the goal of treatment in her case is cure. She has been encouraged to call with any issues that might arise before her next visit here.   Laurie Panda, NP   10/17/2015 2:26 PM

## 2015-10-18 ENCOUNTER — Encounter: Payer: Self-pay | Admitting: *Deleted

## 2015-10-18 ENCOUNTER — Encounter: Payer: Self-pay | Admitting: Oncology

## 2015-10-18 ENCOUNTER — Ambulatory Visit (HOSPITAL_COMMUNITY): Payer: BLUE CROSS/BLUE SHIELD

## 2015-10-18 NOTE — Progress Notes (Signed)
Met w/ pt to discuss copay assistance. Informed pt of the [place] First Step program & went over how the program works.  Enrolled her on 10/18/15. Faxed signed app & activated card today.  Talked about the [facility] [place] which pt is approved for $5000 to cover drugs associated w/ her Dx for 12 months from 10/18/15 w/ a 6 month look back period. $9093 is guaranteed, $3350 is accessible on a first-come first-served basis as long as funding is available. I emailed copy of approval letter & POE to [place] in billing. Informed pt of the [place] went over what it will & will not cover which pt is approved for the grant.  Gave her an expense sheet.  I also gave pt applications for [employer] to apply for additional assistance for personal bills.

## 2015-10-19 ENCOUNTER — Telehealth: Payer: Self-pay | Admitting: *Deleted

## 2015-10-19 ENCOUNTER — Encounter: Payer: Self-pay | Admitting: *Deleted

## 2015-10-19 NOTE — Telephone Encounter (Signed)
Received call from pt concerning ulcer to the roof of her mouth. Pt noticed the pain and felt the ulcer on yesterday. Pt describes pain as throbbing. She has taken 3 Tylenol for pain. I asked pt has she picked up Valtrex from pharmacy. She had not , I called pharmacy and it can be picked up today. I will call pt back to let her know. Message to be fwd to [employer]

## 2015-10-19 NOTE — Progress Notes (Signed)
[name]  Clinical Social Work phoned pt as she had dropped off partially completed application for [employer] in [place]. CSW reviewed documents. Pt needs several additional pieces of paperwork in order to submit. CSW phoned pt and left vm for her to gather documents and then CSW would fax. CSW faxed incomplete application as directed by pt.   Clinical Social Work interventions: Resource assistance [name], [place] Worker [place]  [place] Phone: [phone] Fax: [phone]

## 2015-10-21 ENCOUNTER — Telehealth: Payer: Self-pay | Admitting: *Deleted

## 2015-10-21 NOTE — Telephone Encounter (Signed)
Pt called  earlier about her venlafaxine has ran out and she can't get it refilled yet, too soon. Pt takes medication for hotflashes and when this Rx was first written she admitted to taking more than one tablet "one or two times". Unfortunately because of this she will have to wait to get a refill no earlier than 3/22. I advised her to use her Gabapentin at bedtime to help with hotflashes. I left her a message about this to her VM and if she has questions she knows to call this nurse back at [phone]. Message to be fwd to [employer]

## 2015-10-25 ENCOUNTER — Ambulatory Visit: Payer: BLUE CROSS/BLUE SHIELD | Attending: Nurse Practitioner | Admitting: Physical Therapy

## 2015-10-25 ENCOUNTER — Ambulatory Visit: Payer: BLUE CROSS/BLUE SHIELD | Admitting: Physical Therapy

## 2015-10-25 DIAGNOSIS — R530 Neoplastic (malignant) related fatigue: Secondary | ICD-10-CM | POA: Diagnosis present

## 2015-10-25 DIAGNOSIS — M25612 Stiffness of left shoulder, not elsewhere classified: Secondary | ICD-10-CM | POA: Diagnosis present

## 2015-10-25 DIAGNOSIS — M25611 Stiffness of right shoulder, not elsewhere classified: Secondary | ICD-10-CM | POA: Insufficient documentation

## 2015-10-25 DIAGNOSIS — I972 Postmastectomy lymphedema syndrome: Secondary | ICD-10-CM | POA: Diagnosis present

## 2015-10-26 NOTE — Therapy (Signed)
[place], [place], [address] Phone: [phone]   Fax:  [phone]  Physical Therapy Evaluation  Patient Details  Name: Margaret [name] MRN: [id number] Date of Birth: 07-Mar-1961 Referring Provider: Gentry Fitz   Encounter Date: 10/25/2015      PT End of Session - 10/26/15 1422    Visit Number 1   Number of Visits 17   Date for PT Re-Evaluation 12/26/15   PT Start Time [id number]   PT Stop Time 1100   PT Time Calculation (min) 45 min   Activity Tolerance Patient tolerated treatment well   Behavior During Therapy [facility] for tasks assessed/performed      Past Medical History  Diagnosis Date  . Hypertension   . Breast cancer of upper-outer quadrant of left female breast ([place]) 04/21/2015  . Breast cancer ([place])   . GERD (gastroesophageal reflux disease)   . Bilateral breast cancer [facility])     Past Surgical History  Procedure Laterality Date  . Abdominal hysterectomy  1990  . Tubal ligation  1984  . Mastectomy w/ sentinel node biopsy Right   . Mastectomy modified radical Left 05/09/2015  . Mastectomy w/ sentinel node biopsy Right 05/09/2015    Procedure: RIGHT MASTECTOMY WITH RIGHT AXILLARY SENTINEL LYMPH NODE BIOPSY;  Surgeon: Autumn Messing III, MD;  Location: [place];  Service: General;  Laterality: Right;  . Mastectomy modified radical Left 05/09/2015    Procedure: LEFT MODIFIED RADICAL MASTETCTOMY;  Surgeon: Autumn Messing III, MD;  Location: [place];  Service: General;  Laterality: Left;  . Portacath placement Right 09/01/2015    Procedure: INSERTION PORT-A-CATH;  Surgeon: Autumn Messing III, MD;  Location: [place];  Service: General;  Laterality: Right;    There were no vitals filed for this visit.  Visit Diagnosis:  Lymphedema syndrome, postmastectomy - Plan: PT plan of care cert/re-cert  Stiffness of joint, shoulder region, left - Plan: PT plan of care cert/re-cert  Stiffness of joint,  shoulder region, right - Plan: PT plan of care cert/re-cert  Neoplastic malignant related fatigue - Plan: PT plan of care cert/re-cert      Subjective Assessment - 10/25/15 1017    Subjective Pt says that both of her arms are swollen and her left arm hurts. She is supposed to have a test to see if there is a blood clot, but she couldn't make it and needs to get it rescheduled    Pertinent History bilateral mastectomy in October 2016 9 nodes removed from left side and 5 from the right side.   She has completed radition is now taking chemotherapy. She has it on Mondays every three weeks and has 2 more.  She has just started having tingling in hands and feet.  Otherwise she is doing OK    Patient Stated Goals pt wants to be able to wash her back, will be able to open jars and will have the swelling down    Currently in Pain? Yes   Pain Score 4    Pain Location Arm   Pain Orientation Left   Pain Descriptors / Indicators Aching;Discomfort;Tingling   Pain Type Chronic pain   Pain Onset More than a month ago   Effect of Pain on Daily Activities sometimes it affects her back             [facility] PT Assessment - 10/26/15 0001    Assessment   Medical Diagnosis breast cancer    Referring Provider Nira Conn  Boelter    Onset Date/Surgical Date 05/09/15   Hand Dominance Right   Precautions   Precautions Other (comment)   Precaution Comments cancer    Restrictions   Weight Bearing Restrictions No   Balance Screen   Has the patient fallen in the past 6 months No   Has the patient had a decrease in activity level because of a fear of falling?  No   Is the patient reluctant to leave their home because of a fear of falling?  No   Home Social worker Private residence   Living Arrangements Alone   Available Help at Discharge Available PRN/intermittently   Type of [place] to enter   Entrance Stairs-Number of Steps --  >10   Entrance Stairs-Rails Left   pt has difficulty holding onto rail with left arm    Prior Function   Level of Independence Independent with basic ADLs  can't live grocery bags, had some trouble with stairs    Vocation Other (comment)  on leave from work as Scientist, research (medical), applied for disability   Vocation Requirements being on feet, using arms    Leisure not doing exercise, hangs out with family , shopping, church    Cognition   Overall Cognitive Status Within Functional Limits for tasks assessed   Observation/Other Assessments   Observations pt obese with dark skin from radiation at left chest and axilla with opening a medical and lateral incision.     Skin Integrity openings are from radiation and feels that she is mostly healed    Other Surveys  --  Lymphedema Life Impac Scale 50 or 74% impaired    Coordination   Gross Motor Movements are Fluid and Coordinated Yes  pt moves slowly   Sit to Stand   Comments 9 reps in 30 seconds   Posture/Postural Control   Posture/Postural Control Postural limitations   Postural Limitations Rounded Shoulders;Forward head   Posture Comments Pt with abdominal obesity and wears "body shaper "garments    ROM / Strength   AROM / PROM / Strength AROM   AROM   Right Shoulder Flexion 105 Degrees   Right Shoulder ABduction 86 Degrees   Right Shoulder Internal Rotation 55 Degrees   Right Shoulder External Rotation 85 Degrees   Left Shoulder Flexion 80 Degrees   Left Shoulder ABduction 70 Degrees   Left Shoulder Internal Rotation 55 Degrees   Left Shoulder External Rotation 70 Degrees   Palpation   Palpation comment lymphostatic fibrosis in left UE  decreased scar mobility in  both scars on chest            LYMPHEDEMA/ONCOLOGY QUESTIONNAIRE - 10/25/15 1038    Treatment   Active Radiation Treatment --   Date --   Body Site --   Past Radiation Treatment Yes   Date 08/21/15   Body Site left chest    What other symptoms do you have   Are you Having Heaviness or Tightness  Yes   Are you having Pain No   Is it Hard or Difficult finding clothes that fit Yes   Do you have infections No   Is there Decreased scar mobility No   [place] Sign No   Right Upper Extremity Lymphedema   15 cm Proximal to Olecranon Process 47.5 cm   10 cm Proximal to Olecranon Process 44.5 cm   Olecranon Process 34.4 cm   15 cm Proximal to Ulnar Styloid Process 32.5 cm  10 cm Proximal to Ulnar Styloid Process 28.5 cm   Just Proximal to Ulnar Styloid Process 20.6 cm   Across Hand at [employer] 22 cm   At [place] of 2nd Digit 8 cm   Left Upper Extremity Lymphedema   15 cm Proximal to Olecranon Process 48.5 cm   10 cm Proximal to Olecranon Process 47 cm   Olecranon Process 35.5 cm   15 cm Proximal to Ulnar Styloid Process 34 cm   10 cm Proximal to Ulnar Styloid Process 30.4 cm   Just Proximal to Ulnar Styloid Process 21 cm   Across Hand at [employer] 21.5 cm   At [place] of 2nd Digit 8 cm           Quick Dash - 10/26/15 0001    Open a tight or new jar Moderate difficulty   Do heavy household chores (wash walls, wash floors) Moderate difficulty   Carry a shopping bag or briefcase No difficulty   Wash your back No difficulty   Use a knife to cut food Mild difficulty   Recreational activities in which you take some force or impact through your arm, shoulder, or hand (golf, hammering, tennis) Moderate difficulty   During the past week, to what extent has your arm, shoulder or hand problem interfered with your normal social activities with family, friends, neighbors, or groups? Quite a bit   During the past week, to what extent has your arm, shoulder or hand problem limited your work or other regular daily activities Quite a bit   Arm, shoulder, or hand pain. Extreme   Tingling (pins and needles) in your arm, shoulder, or hand Extreme   Difficulty Sleeping So much difficuSo much difficulty, I can't sleep   DASH Score 56.82 %                        Short  Term Clinic Goals - 10/26/15 1436    CC Short Term Goal  #1   Title Patient with verbalize an understanding of lymphedema risk reduction precautions   Time 4   Period Weeks   Status New   CC Short Term Goal  #2   Title Patient will be independent in a basic home exercise program so that she can tolerate the rest of chemo and perform her ADLs better   Time 4   Period Weeks   Status New   CC Short Term Goal  #3   Title Patient will be independent in self manual lymph drainage so she can begin to manage her lymphedema at home    Time 4   Period [place] [facility] Goals - 10/26/15 1438    [place] Term Goal  #1   Title Patient will improve left and right shoulder abduction to 120 degrees so that she can perform ADLs with greater ease a home    Time 8   Period Weeks   Status New   CC Long Term Goal  #2   Title Patient will report mproved ability to do household tasks of cooking and cleaning with greater ease    Time 8   Period Weeks   Status New   CC Long Term Goal  #3   Title Patient will be know how to obtain and use compression garments for maintenance phase of treatment   Time 8  Period Weeks   Status New            Plan - 10/26/15 1422    Clinical Impression Statement Obese pt with bilateral masecomty with 9 nodes removed from left and 5 from right side who has had  left radiation is currently undergoing  chemotherapy. She reports swelling in both arms, but more on the left. She would like to try compression bandaging prior to  left arm prior to getting  compression sleeves and will need ongoing lymphedema risk reduction education and adjustments to treatment as needed  She will benefit from a general exercise program as well to transtion to a community porgram. for ongoing fitness.    Pt will benefit from skilled therapeutic intervention in order to improve on the following deficits Increased fascial restricitons;Impaired UE functional  use;Obesity;Decreased activity tolerance;Decreased knowledge of precautions;Decreased skin integrity;Impaired perceived functional ability;Pain;Decreased scar mobility;Decreased knowledge of use of DME;Decreased mobility;Decreased strength;Increased edema;Postural dysfunction   Rehab Potential Good   Clinical Impairments Affecting Rehab Potential lymph nodes removed from both axilla, radiaton to left upper quarter, ongoing chemotherapy    PT Frequency 2x / week   PT Duration 8 weeks   PT Treatment/Interventions ADLs/Self Care Home Management;Manual lymph drainage;Compression bandaging;Scar mobilization;Passive range of motion;Patient/family education;DME Instruction;Functional mobility training;Therapeutic activities;Therapeutic exercise;Manual techniques;Taping   PT Next Visit Plan Manual lymph drainage to chest LUE and back with scar mobilizaion as needed.  Bandaging to left arm if pt agrees and wans to proceed, Evenually  get garments of some type for both arms . Begin dowel rod exercise    Consulted and Agree with Plan of Care Patient         Problem List Patient Active Problem List   Diagnosis Date Noted  . Bilateral breast cancer ([place]) 05/09/2015  . Breast cancer of upper-inner quadrant of right female breast ([place]) 04/27/2015  . Hypertension, essential 04/27/2015  . Breast cancer of upper-outer quadrant of left female breast ([place]) 04/21/2015   Donato Heinz. Owens Shark, PT  10/26/2015, 2:44 PM  [place], [place], [address] Phone: [phone]   Fax:  [phone]  Name: Margaret [name] MRN: [id number] Date of Birth: 1960-12-25

## 2015-10-27 ENCOUNTER — Ambulatory Visit: Payer: BLUE CROSS/BLUE SHIELD | Admitting: Physical Therapy

## 2015-10-31 ENCOUNTER — Ambulatory Visit (HOSPITAL_BASED_OUTPATIENT_CLINIC_OR_DEPARTMENT_OTHER): Payer: BLUE CROSS/BLUE SHIELD

## 2015-10-31 ENCOUNTER — Other Ambulatory Visit (HOSPITAL_BASED_OUTPATIENT_CLINIC_OR_DEPARTMENT_OTHER): Payer: BLUE CROSS/BLUE SHIELD

## 2015-10-31 ENCOUNTER — Encounter: Payer: Self-pay | Admitting: Nurse Practitioner

## 2015-10-31 ENCOUNTER — Encounter: Payer: Self-pay | Admitting: *Deleted

## 2015-10-31 ENCOUNTER — Telehealth: Payer: Self-pay | Admitting: Oncology

## 2015-10-31 ENCOUNTER — Ambulatory Visit (HOSPITAL_BASED_OUTPATIENT_CLINIC_OR_DEPARTMENT_OTHER): Payer: BLUE CROSS/BLUE SHIELD | Admitting: Nurse Practitioner

## 2015-10-31 VITALS — BP 160/84 | HR 106 | Temp 98.0°F | Resp 18 | Ht 69.0 in | Wt 294.0 lb

## 2015-10-31 DIAGNOSIS — C50912 Malignant neoplasm of unspecified site of left female breast: Secondary | ICD-10-CM

## 2015-10-31 DIAGNOSIS — C50411 Malignant neoplasm of upper-outer quadrant of right female breast: Secondary | ICD-10-CM

## 2015-10-31 DIAGNOSIS — C50211 Malignant neoplasm of upper-inner quadrant of right female breast: Secondary | ICD-10-CM

## 2015-10-31 DIAGNOSIS — C773 Secondary and unspecified malignant neoplasm of axilla and upper limb lymph nodes: Secondary | ICD-10-CM | POA: Diagnosis not present

## 2015-10-31 DIAGNOSIS — C50412 Malignant neoplasm of upper-outer quadrant of left female breast: Secondary | ICD-10-CM

## 2015-10-31 DIAGNOSIS — M79602 Pain in left arm: Secondary | ICD-10-CM

## 2015-10-31 DIAGNOSIS — Z5111 Encounter for antineoplastic chemotherapy: Secondary | ICD-10-CM

## 2015-10-31 LAB — COMPREHENSIVE METABOLIC PANEL
ALT: 31 U/L (ref 0–55)
AST: 19 U/L (ref 5–34)
Albumin: 3.4 g/dL — ABNORMAL LOW (ref 3.5–5.0)
Alkaline Phosphatase: 91 U/L (ref 40–150)
Anion Gap: 8 mEq/L (ref 3–11)
BUN: 13.7 mg/dL (ref 7.0–26.0)
CO2: 26 mEq/L (ref 22–29)
Calcium: 9.1 mg/dL (ref 8.4–10.4)
Chloride: 107 mEq/L (ref 98–109)
Creatinine: 0.8 mg/dL (ref 0.6–1.1)
EGFR: >90 mL/min/{1.73_m2} (ref >90)
Glucose: 125 mg/dl (ref 70–140)
Potassium: 3.4 mEq/L — ABNORMAL LOW (ref 3.5–5.1)
Sodium: 142 mEq/L (ref 136–145)
Total Bilirubin: <0.3 mg/dL (ref 0.20–1.20)
Total Protein: 7.1 g/dL (ref 6.4–8.3)

## 2015-10-31 LAB — CBC WITH DIFFERENTIAL/PLATELET
BASO%: 0.5 % (ref 0.0–2.0)
Basophils Absolute: 0.1 10*3/uL (ref 0.0–0.1)
EOS%: 0.2 % (ref 0.0–7.0)
Eosinophils Absolute: 0 10*3/uL (ref 0.0–0.5)
HCT: 36.1 % (ref 34.8–46.6)
HGB: 11.7 g/dL (ref 11.6–15.9)
LYMPH%: 11.1 % — ABNORMAL LOW (ref 14.0–49.7)
MCH: 28.6 pg (ref 25.1–34.0)
MCHC: 32.3 g/dL (ref 31.5–36.0)
MCV: 88.5 fL (ref 79.5–101.0)
MONO#: 2.3 10*3/uL — ABNORMAL HIGH (ref 0.1–0.9)
MONO%: 13.2 % (ref 0.0–14.0)
NEUT#: 12.9 10*3/uL — ABNORMAL HIGH (ref 1.5–6.5)
NEUT%: 75 % (ref 38.4–76.8)
Platelets: 173 10*3/uL (ref 145–400)
RBC: 4.07 10*6/uL (ref 3.70–5.45)
RDW: 16 % — ABNORMAL HIGH (ref 11.2–14.5)
WBC: 17.3 10*3/uL — ABNORMAL HIGH (ref 3.9–10.3)
lymph#: 1.9 10*3/uL (ref 0.9–3.3)

## 2015-10-31 MED ORDER — SODIUM CHLORIDE 0.9 % IV SOLN
Freq: Once | INTRAVENOUS | Status: AC
  Administered 2015-10-31: 15:00:00 via INTRAVENOUS

## 2015-10-31 MED ORDER — SODIUM CHLORIDE 0.9 % IJ SOLN
10.0000 mL | INTRAMUSCULAR | Status: DC | PRN
  Administered 2015-10-31: 10 mL
  Filled 2015-10-31: qty 10

## 2015-10-31 MED ORDER — SODIUM CHLORIDE 0.9 % IV SOLN
75.0000 mg/m2 | Freq: Once | INTRAVENOUS | Status: AC
  Administered 2015-10-31: 190 mg via INTRAVENOUS
  Filled 2015-10-31: qty 16

## 2015-10-31 MED ORDER — SODIUM CHLORIDE 0.9 % IV SOLN
Freq: Once | INTRAVENOUS | Status: AC
  Administered 2015-10-31: 15:00:00 via INTRAVENOUS
  Filled 2015-10-31: qty 8

## 2015-10-31 MED ORDER — SODIUM CHLORIDE 0.9 % IV SOLN
600.0000 mg/m2 | Freq: Once | INTRAVENOUS | Status: AC
  Administered 2015-10-31: 1500 mg via INTRAVENOUS
  Filled 2015-10-31: qty 75

## 2015-10-31 MED ORDER — PEGFILGRASTIM 6 MG/0.6ML ~~LOC~~ PSKT
6.0000 mg | PREFILLED_SYRINGE | Freq: Once | SUBCUTANEOUS | Status: AC
  Administered 2015-10-31: 6 mg via SUBCUTANEOUS
  Filled 2015-10-31: qty 0.6

## 2015-10-31 MED ORDER — HEPARIN SOD (PORK) LOCK FLUSH 100 UNIT/ML IV SOLN
500.0000 [IU] | Freq: Once | INTRAVENOUS | Status: AC | PRN
  Administered 2015-10-31: 500 [IU]
  Filled 2015-10-31: qty 5

## 2015-10-31 NOTE — Patient Instructions (Signed)
[facility] Discharge Instructions for Patients Receiving Chemotherapy  Today you received the following chemotherapy agents Taxotere and Carboplatin  To help prevent nausea and vomiting after your treatment, we encourage you to take your nausea medication as directed.    If you develop nausea and vomiting that is not controlled by your nausea medication, call the clinic.   BELOW ARE SYMPTOMS THAT SHOULD BE REPORTED IMMEDIATELY:  *FEVER GREATER THAN 100.5 F  *CHILLS WITH OR WITHOUT FEVER  NAUSEA AND VOMITING THAT IS NOT CONTROLLED WITH YOUR NAUSEA MEDICATION  *UNUSUAL SHORTNESS OF BREATH  *UNUSUAL BRUISING OR BLEEDING  TENDERNESS IN MOUTH AND THROAT WITH OR WITHOUT PRESENCE OF ULCERS  *URINARY PROBLEMS  *BOWEL PROBLEMS  UNUSUAL RASH Items with * indicate a potential emergency and should be followed up as soon as possible.  Feel free to call the clinic you have any questions or concerns. The clinic phone number is [phone].  Please show the CHEMO ALERT CARD at check-in to the Emergency Department and triage nurse.   

## 2015-10-31 NOTE — Telephone Encounter (Signed)
Per 3/27 pof r/s missed doppler. New appointment 3/29 @ 9 am at [facility] - date per patient. No other orders per 3/27 pof - patient given avs report and appointments for April.

## 2015-10-31 NOTE — Progress Notes (Signed)
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 She has been receiving medical care through the emergency room and was seen in March 2015 following a head injury, then in August 2015 for lancing of an abscess. In December 2015 she noticed that her left breast looked a little bit smaller than her right. She brought this to the attention of friends and family over the next several months but the general feeling was that most people are not perfectly symmetrical. By the summer of this year she noticed that her nipple on the left was "going in". More recently she started developing pain in the left breast. She was evaluated for this in the emergency room 04/09/2015 at which time a left breast exam showed a deformed left breast with a large hard mass encompassing most of the breast, with nipple retraction. There was no nipple discharge or bleeding and no palpable adenopathy.  The patient was referred to [facility] and on 04/13/2015 she underwent bilateral diagnostic mammography with tomosynthesis as well as bilateral breast ultrasound. The breast density was category B. In the right breast at the 11:00 position there was a 1.3 cm irregular  mass which by ultrasonography measured 1.3 cm.--In the left breast there was a 4 cm irregular mass at the 2:00 position associated with nipple retraction. There was a second, 1.7 cm area of architectural distortion at the 9:00 position. Both were palpable. By ultrasonography, the 4 cm irregular mass was noted, with a second mass measuring 2.5 cm by ultrasonography. Both axillae were benign.  On 04/19/2015 the patient underwent right breast upper outer quadrant biopsy, showing ([id number]) and invasive ductal carcinoma, grade 1, estrogen receptor 90% positive, progesterone receptor negative, with an MIB-1 of 5%, and no HER-2 amplification, the signals ratio being 1.26 and the number per cell 2.20.  On the same day, she underwent biopsy of the 2 left breast masses in question as well as a suspicious left axillary lymph node. All 3 biopsies showed invasive ductal carcinoma, grade 2, estrogen receptor 80-100% positive, progesterone receptor 80-100% positive, with the MIB-1 between 5 and 10%, and no HER-2 amplification, the signals ratio being between 1.05 and 1.13, and the number per cell between 2.10 and 2.25.  The patient's subsequent history is as detailed below  INTERVAL HISTORY: Asia returns today for follow-up of her bilateral breast cancer. Today is day 1, cycle 3 of cyclophosphamide and docetaxel, with neulasta given on day 2 of granulocyte support.   REVIEW OF SYSTEMS: Rylee has been working with physical therapy since our last visit and they are ordering her a compression sleeve. The arm is still enlarged and slightly painful. It is not warm or red however. She had a brief episode of numbness to one or both hands, she can't remember. It was the most  prominent on Friday night, but she has not felt it since. She denies fevers, chills, nausea, or vomiting. She is moving her bowels well. She has no mouth sores or rashes. Her appetite is good. A detailed review of systems is otherwise  stable.   PAST MEDICAL HISTORY: Past Medical History  Diagnosis Date  . Hypertension   . Breast cancer of upper-outer quadrant of left female breast ([place]) 04/21/2015  . Breast cancer ([place])   . GERD (gastroesophageal reflux disease)   . Bilateral breast cancer ([place])     PAST SURGICAL HISTORY: Past Surgical History  Procedure Laterality Date  . Abdominal hysterectomy  1990  . Tubal ligation  1984  . Mastectomy w/ sentinel node biopsy Right   . Mastectomy modified radical Left 05/09/2015  . Mastectomy w/ sentinel node biopsy Right 05/09/2015    Procedure: RIGHT MASTECTOMY WITH RIGHT AXILLARY SENTINEL LYMPH NODE BIOPSY;  Surgeon: Autumn Messing III, MD;  Location: [place];  Service: General;  Laterality: Right;  . Mastectomy modified radical Left 05/09/2015    Procedure: LEFT MODIFIED RADICAL MASTETCTOMY;  Surgeon: Autumn Messing III, MD;  Location: [place];  Service: General;  Laterality: Left;  . Portacath placement Right 09/01/2015    Procedure: INSERTION PORT-A-CATH;  Surgeon: Autumn Messing III, MD;  Location: [place];  Service: General;  Laterality: Right;    FAMILY HISTORY Family History  Problem Relation Age of Onset  . Hypertension Mother   . Diabetes Mother     the patient has little information on her father. Her mother is living, currently age 21. She has one brother, 2 sisters. There is no history of breast or ovarian cancer in the family to her knowledge.   GYNECOLOGIC HISTORY:  No LMP recorded. Patient has had a hysterectomy.  menarche age 44, first live birth age 55. She is GX P4. She underwent a total abdominal hysterectomy with bilateral salpingo-oophorectomy at age 21. She did not take hormone replacement. She never used oral contraceptives.   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 She is not employed by [employer] but by a Set designer. She is single and lives with her significant other [name]. He  is status post renal transplant. The patient's daughter  [name] works as a Programmer, applications, as does her daughter  [name]. Daughter [name] works at [employer], as a call. All 3 live in [place]. The patient's son Marc Morgans Lichtenberger's lives in [place]. He prepares sets for shows The patient has 4 grandchildren, no great grandchildren. She attends a [employer] in [place].     ADVANCED DIRECTIVES: Not in place. At the initial clinic visit the patient was given the appropriate forms to complete and notarize at her discretion. The patient intends to name her daughter Sampson Si as healthcare power of attorney. She can be reached at [phone].  HEALTH MAINTENANCE: Social History  Substance Use Topics  . Smoking status: Former Smoker -- 0.10 packs/day for 28 years  . Smokeless tobacco: Never Used  . Alcohol Use: No     Colonoscopy: Never  PAP: Status post hysterectomy  Bone density: Never  Lipid panel:  Allergies  Allergen Reactions  . Latex Itching and Other (See Comments)    burning  . 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 Wheat Bran Hives    Current Outpatient Prescriptions  Medication  Sig Dispense Refill  . cyclobenzaprine (FLEXERIL) 5 MG tablet Take 1 tablet (5 mg total) by mouth 3 (three) times daily as needed for muscle spasms. 30 tablet 0  . gabapentin (NEURONTIN) 300 MG capsule TAKE ONE CAPSULE BY MOUTH 3 TIMES A DAY 90 capsule 2  . hydrochlorothiazide (HYDRODIURIL) 12.5 MG tablet Take 1 tablet (12.5 mg total) by mouth daily. 30 tablet 3  . lidocaine-prilocaine (EMLA) cream Apply to affected area once 30 g 3  . LORazepam (ATIVAN) 0.5 MG tablet Take 1 tablet (0.5 mg total) by mouth at bedtime as needed (Nausea or vomiting). 30 tablet 0  . losartan (COZAAR) 50 MG tablet Take 1 tablet (50 mg total) by mouth daily. 30 tablet 1  . naproxen (NAPROSYN) 500 MG tablet Take 1 tablet (500 mg total) by mouth 2  (two) times daily with a meal. 60 tablet 1  . pantoprazole (PROTONIX) 40 MG tablet Take 1 tablet (40 mg total) by mouth daily. 30 tablet 1  . valACYclovir (VALTREX) 500 MG tablet Take 1 tablet (500 mg total) by mouth daily. 30 tablet 1  . venlafaxine XR (EFFEXOR XR) 75 MG 24 hr capsule Take 1 capsule (75 mg total) by mouth daily with breakfast. 90 capsule 2  . acetaminophen (TYLENOL) 500 MG tablet Take 1,000 mg by mouth every 6 (six) hours as needed for mild pain. Reported on 10/31/2015    . dexamethasone (DECADRON) 4 MG tablet Take 2 tablets (8 mg total) by mouth 2 (two) times daily. Start the day before Taxotere. Then again the day after chemo for 3 days. (Patient not taking: Reported on 10/17/2015) 30 tablet 1  . ibuprofen (ADVIL,MOTRIN) 200 MG tablet Take 800-1,000 mg by mouth 3 (three) times daily as needed for headache. Reported on 10/31/2015    . nystatin (MYCOSTATIN) 100000 UNIT/ML suspension Take 5 mLs (500,000 Units total) by mouth 4 (four) times daily. (Patient not taking: Reported on 10/10/2015) 240 mL 0  . ondansetron (ZOFRAN) 8 MG tablet Take 1 tablet (8 mg total) by mouth 2 (two) times daily. Start the day after chemo for 3 days. Then take as needed for nausea or vomiting. (Patient not taking: Reported on 10/10/2015) 30 tablet 1  . oxyCODONE-acetaminophen (ROXICET) 5-325 MG tablet Take 1-2 tablets by mouth every 4 (four) hours as needed. (Patient not taking: Reported on 09/15/2015) 30 tablet 0  . prochlorperazine (COMPAZINE) 10 MG tablet Take 1 tablet (10 mg total) by mouth every 6 (six) hours as needed (Nausea or vomiting). (Patient not taking: Reported on 10/10/2015) 30 tablet 1   No current facility-administered medications for this visit.    OBJECTIVE: Middle-aged African-American woman who appears stated age 55 Vitals:   10/31/15 1344  BP: 160/84  Pulse: 106  Temp: 98 F (36.7 C)  Resp: 18     Body mass index is 43.4 kg/(m^2).    ECOG FS:1 - Symptomatic but completely  ambulatory   Skin: warm, dry  HEENT: sclerae anicteric, conjunctivae pink, oropharynx clear. No thrush or mucositis.  Lymph Nodes: No cervical or supraclavicular lymphadenopathy  Lungs: clear to auscultation bilaterally, no rales, wheezes, or rhonci  Heart: regular rate and rhythm  Abdomen: round, soft, non tender, positive bowel sounds  Musculoskeletal: No focal spinal tenderness, left upper arm grade 1 edema Neuro: non focal, well oriented, positive affect  Breasts: deferred  LAB RESULTS:  CMP     Component Value Date/Time   NA 141 10/17/2015 1306   NA 136 06/29/2015 3903  K 3.4* 10/17/2015 1306   K 4.0 06/29/2015 0923   CL 102 06/29/2015 0923   CO2 27 10/17/2015 1306   CO2 24 06/29/2015 0923   GLUCOSE 96 10/17/2015 1306   GLUCOSE 107* 06/29/2015 0923   BUN 17.6 10/17/2015 1306   BUN 7 06/29/2015 0923   CREATININE 0.9 10/17/2015 1306   CREATININE 0.84 06/29/2015 0923   CALCIUM 9.1 10/17/2015 1306   CALCIUM 9.2 06/29/2015 0923   PROT 7.3 10/17/2015 1306   PROT 7.8 06/29/2015 0923   ALBUMIN 3.5 10/17/2015 1306   ALBUMIN 3.5 06/29/2015 0923   AST 15 10/17/2015 1306   AST 20 06/29/2015 0923   ALT 20 10/17/2015 1306   ALT 21 06/29/2015 0923   ALKPHOS 138 10/17/2015 1306   ALKPHOS 87 06/29/2015 0923   BILITOT 0.32 10/17/2015 1306   BILITOT 1.0 06/29/2015 0923   GFRNONAA >60 06/29/2015 0923   GFRAA >60 06/29/2015 0923    INo results found for: SPEP, UPEP  Lab Results  Component Value Date   WBC 17.3* 10/31/2015   NEUTROABS 12.9* 10/31/2015   HGB 11.7 10/31/2015   HCT 36.1 10/31/2015   MCV 88.5 10/31/2015   PLT 173 10/31/2015      Chemistry      Component Value Date/Time   NA 141 10/17/2015 1306   NA 136 06/29/2015 0923   K 3.4* 10/17/2015 1306   K 4.0 06/29/2015 0923   CL 102 06/29/2015 0923   CO2 27 10/17/2015 1306   CO2 24 06/29/2015 0923   BUN 17.6 10/17/2015 1306   BUN 7 06/29/2015 0923   CREATININE 0.9 10/17/2015 1306   CREATININE 0.84  06/29/2015 0923      Component Value Date/Time   CALCIUM 9.1 10/17/2015 1306   CALCIUM 9.2 06/29/2015 0923   ALKPHOS 138 10/17/2015 1306   ALKPHOS 87 06/29/2015 0923   AST 15 10/17/2015 1306   AST 20 06/29/2015 0923   ALT 20 10/17/2015 1306   ALT 21 06/29/2015 0923   BILITOT 0.32 10/17/2015 1306   BILITOT 1.0 06/29/2015 0923       No results found for: LABCA2  No components found for: [id number]  No results for input(s): INR in the last 168 hours.  Urinalysis    Component Value Date/Time   COLORURINE YELLOW 06/29/2015 1030   APPEARANCEUR CLOUDY* 06/29/2015 1030   LABSPEC 1.019 06/29/2015 1030   PHURINE 7.0 06/29/2015 1030   GLUCOSEU NEGATIVE 06/29/2015 1030   HGBUR TRACE* 06/29/2015 1030   BILIRUBINUR NEGATIVE 06/29/2015 1030   KETONESUR NEGATIVE 06/29/2015 1030   PROTEINUR NEGATIVE 06/29/2015 1030   NITRITE NEGATIVE 06/29/2015 1030   LEUKOCYTESUR MODERATE* 06/29/2015 1030    STUDIES: No results found.   ASSESSMENT: 55 y.o. [place] woman with synchronous breast cancers, as follows  (a) status post right breast upper outer quadrant biopsy 04/19/2015 for a clinical pT1c N0, stage IA invasive ductal carcinoma, grade 1, estrogen receptor positive, progesterone receptor negative, with an MIB-1 of 5%, and no HER-2 amplification  (b) status post left breast biopsy 2 and left axillary lymph node biopsy 04/19/2015 for a clinical T3 N1, stage IIIA invasive ductal carcinoma, grade 1 or 2, estrogen receptor and progesterone receptor positive, HER-2 negative, with an MIB-1 between 5 and 10%  (1) status post bilateral mastectomies 05/09/2015, showing:   (a) on the right side, a pT2 pN0, stage IIA invasive ductal carcinoma, with negative margins and repeat HER-2 again negative    (b) on the left, a pT3 pN2,  stage IIIA invasive ductal carcinoma, grade 2, with negative margins, and repeat HER-2 again negative  (2) Mammaprint from the left-sided tumor returned "luminal type, low  risk", predicting a small benefit from chemotherapy with the important caveat that N2 disease was not included in the MINDACT study  (3) Oncotype from the right-sided tumor showed a score of 12, predicting an 8% risk of recurrence outside the breast within the next 10 years, if the patient's only systemic therapy was tamoxifen for 5 years. It also predicted no significant benefit from chemotherapy  (4) postmastectomy radiation on the left to be completed 08/16/2014  (5) adjuvant chemotherapy to consist of cyclophosphamide and docetaxel x4 starting 09/19/15  (5) 10 years of antiestrogen therapy to follow with consideration of the PALLAS trial  PLAN: Fortunately, Mardell has no neuropathy on today's presentation. The labs were reviewed in detail and were stable. She will proceed with cycle 3 of cyclophosphamide and docetaxel.   We are rescheduling the ultrasound of the left arm to make sure there is no evidence of a DVT. This is likely lymphedema, however.   Nonie will return in 1 week for labs and a nadir visit. She understands and agrees with this plan. She knows the goal of treatment in her case is cure. She has been encouraged to call with any issues that might arise before her next visit here.   Laurie Panda, NP   10/31/2015 2:14 PM

## 2015-11-01 ENCOUNTER — Ambulatory Visit: Payer: BLUE CROSS/BLUE SHIELD | Admitting: Physical Therapy

## 2015-11-01 DIAGNOSIS — I972 Postmastectomy lymphedema syndrome: Secondary | ICD-10-CM

## 2015-11-01 DIAGNOSIS — M25612 Stiffness of left shoulder, not elsewhere classified: Secondary | ICD-10-CM

## 2015-11-01 DIAGNOSIS — M25611 Stiffness of right shoulder, not elsewhere classified: Secondary | ICD-10-CM

## 2015-11-01 NOTE — Therapy (Signed)
[place], [place], [address] Phone: [phone]   Fax:  [phone]  Physical Therapy Treatment  Patient Details  Name: Margaret [name] MRN: [id number] Date of Birth: April 10, 1961 Referring Provider: Gentry Fitz   Encounter Date: 11/01/2015      PT End of Session - 11/01/15 1237    Visit Number 2   Number of Visits 17   Date for PT Re-Evaluation 12/26/15   PT Start Time 0935   PT Stop Time 1016   PT Time Calculation (min) 41 min   Activity Tolerance Patient tolerated treatment well   Behavior During Therapy [facility] for tasks assessed/performed      Past Medical History  Diagnosis Date  . Hypertension   . Breast cancer of upper-outer quadrant of left female breast ([employer]) 04/21/2015  . Breast cancer ([place])   . GERD (gastroesophageal reflux disease)   . Bilateral breast cancer [facility])     Past Surgical History  Procedure Laterality Date  . Abdominal hysterectomy  1990  . Tubal ligation  1984  . Mastectomy w/ sentinel node biopsy Right   . Mastectomy modified radical Left 05/09/2015  . Mastectomy w/ sentinel node biopsy Right 05/09/2015    Procedure: RIGHT MASTECTOMY WITH RIGHT AXILLARY SENTINEL LYMPH NODE BIOPSY;  Surgeon: Autumn Messing III, MD;  Location: [place];  Service: General;  Laterality: Right;  . Mastectomy modified radical Left 05/09/2015    Procedure: LEFT MODIFIED RADICAL MASTETCTOMY;  Surgeon: Autumn Messing III, MD;  Location: [place];  Service: General;  Laterality: Left;  . Portacath placement Right 09/01/2015    Procedure: INSERTION PORT-A-CATH;  Surgeon: Autumn Messing III, MD;  Location: [place];  Service: General;  Laterality: Right;    There were no vitals filed for this visit.  Visit Diagnosis:  Lymphedema syndrome, postmastectomy  Stiffness of joint, shoulder region, left  Stiffness of joint, shoulder region, right      Subjective Assessment - 11/01/15 0936    Subjective Will have Doppler ultrasound tomorrow.  Felt like the TG soft was helpful and wants another one.  Wants to be bandaged; has one more chemo left on a Monday, then could schedule Mon., Wed., Friday for that.  Was told we would not bandage today because of the Doppler tomorrow.   Currently in Pain? No/denies            [facility] PT Assessment - 11/01/15 0001    Assessment   Medical Diagnosis                       [facility] Adult PT Treatment/Exercise - 11/01/15 0001    Exercises   Exercises Shoulder   Shoulder Exercises: Seated   Other Seated Exercises backward shoulder rolls at end of session   Manual Therapy   Manual Therapy Soft tissue mobilization;Myofascial release;Manual Lymphatic Drainage (MLD)   Soft tissue mobilization at both sides of chest around incisions   Myofascial Release crosshands tecnhique in vertical and diagonal directions around left chest   Manual Lymphatic Drainage (MLD) instructed in diaphragmatic breathing; short neck, superficial abdomen; right groin and axillo-inguinal anastomosis, and right chest directing toward that pathway.  Same on left side; all that in supine.  Then in right sidelying, left axillo-inguinal anastomosis reinforced.                   Short Term Clinic Goals - 10/26/15 1436    CC Short Term Goal  #  1   Title Patient with verbalize an understanding of lymphedema risk reduction precautions   Time 4   Period Weeks   Status New   CC Short Term Goal  #2   Title Patient will be independent in a basic home exercise program so that she can tolerate the rest of chemo and perform her ADLs better   Time 4   Period Weeks   Status New   CC Short Term Goal  #3   Title Patient will be independent in self manual lymph drainage so she can begin to manage her lymphedema at home    Time 4   Period [place] [facility] Goals - 10/26/15 1438    [place] Term Goal  #1   Title Patient will  improve left and right shoulder abduction to 120 degrees so that she can perform ADLs with greater ease a home    Time 8   Period Weeks   Status New   CC Long Term Goal  #2   Title Patient will report mproved ability to do household tasks of cooking and cleaning with greater ease    Time 8   Period Weeks   Status New   CC Long Term Goal  #3   Title Patient will be know how to obtain and use compression garments for maintenance phase of treatment   Time 8   Period Weeks   Status New            Plan - 11/01/15 1237    Clinical Impression Statement Patient has not had Doppler yet (scheduled for tomorrow), so avoided UE activity today.  Focus was on chest and trunk areas of tightness and swelling.  She responded well to this.   Pt will benefit from skilled therapeutic intervention in order to improve on the following deficits Increased fascial restricitons;Impaired UE functional use;Obesity;Decreased activity tolerance;Decreased knowledge of precautions;Decreased skin integrity;Impaired perceived functional ability;Pain;Decreased scar mobility;Decreased knowledge of use of DME;Decreased mobility;Decreased strength;Increased edema;Postural dysfunction   Rehab Potential Good   Clinical Impairments Affecting Rehab Potential lymph nodes removed from both axilla, radiaton to left upper quarter, ongoing chemotherapy    PT Frequency 2x / week   PT Duration 8 weeks   PT Treatment/Interventions Passive range of motion;Manual lymph drainage;Manual techniques;Therapeutic exercise   PT Next Visit Plan Check if UE Dopplers were negative for clots.  If so, manual lymph drainage to chest, Lt UE and back with scar mobilizaion as needed.  Bandaging to left arm if pt agrees and wans to proceed, Evenually  get garments of some type for both arms . Begin dowel rod exercise    Consulted and Agree with Plan of Care Patient        Problem List Patient Active Problem List   Diagnosis Date Noted  .  Bilateral breast cancer ([place]) 05/09/2015  . Breast cancer of upper-inner quadrant of right female breast ([place]) 04/27/2015  . Hypertension, essential 04/27/2015  . Breast cancer of upper-outer quadrant of left female breast ([place]) 04/21/2015    Kiyoto Slomski 11/01/2015, 12:40 PM  [place], [place], [address] Phone: [phone]   Fax:  [phone]  Name: Margaret [name] MRN: [id number] Date of Birth: 02-09-1961    Serafina Royals, PT 11/01/2015 12:40 PM

## 2015-11-02 ENCOUNTER — Ambulatory Visit (HOSPITAL_COMMUNITY)
Admission: RE | Admit: 2015-11-02 | Discharge: 2015-11-02 | Disposition: A | Discharge status: Home / Self Care | Payer: BLUE CROSS/BLUE SHIELD | Source: Ambulatory Visit | Attending: Nurse Practitioner | Admitting: Nurse Practitioner

## 2015-11-02 DIAGNOSIS — M7989 Other specified soft tissue disorders: Secondary | ICD-10-CM

## 2015-11-02 NOTE — Progress Notes (Signed)
VASCULAR LAB PRELIMINARY  PRELIMINARY  PRELIMINARY  PRELIMINARY  Left upper extremity venous duplex completed.    Left upper extremity- No evidence of DVT or superficial thrombosis.     Cadie Sorci, RVT, RDMS 11/02/2015, 9:52 AM

## 2015-11-03 ENCOUNTER — Encounter: Payer: BLUE CROSS/BLUE SHIELD | Admitting: Physical Therapy

## 2015-11-03 ENCOUNTER — Ambulatory Visit: Payer: BLUE CROSS/BLUE SHIELD | Admitting: Physical Therapy

## 2015-11-03 ENCOUNTER — Other Ambulatory Visit: Payer: Self-pay | Admitting: *Deleted

## 2015-11-03 DIAGNOSIS — I972 Postmastectomy lymphedema syndrome: Secondary | ICD-10-CM

## 2015-11-03 DIAGNOSIS — M25612 Stiffness of left shoulder, not elsewhere classified: Secondary | ICD-10-CM

## 2015-11-03 MED ORDER — GABAPENTIN 300 MG PO CAPS: 300.0000 mg | ORAL_CAPSULE | Freq: Three times a day (TID) | ORAL | Status: DC

## 2015-11-03 NOTE — Therapy (Deleted)
[place], [place], [address] Phone: [phone]   Fax:  [phone]  Physical Therapy Treatment  Patient Details  Name: Margaret [name] MRN: [id number] Date of Birth: 01/16/1961 Referring Provider: Gentry Fitz   Encounter Date: 11/03/2015      PT End of Session - 11/03/15 1100    Visit Number 3   Number of Visits 17   Date for PT Re-Evaluation 12/26/15   Activity Tolerance Patient tolerated treatment well   Behavior During Therapy [facility] for tasks assessed/performed      Past Medical History  Diagnosis Date  . Hypertension   . Breast cancer of upper-outer quadrant of left female breast ([place]) 04/21/2015  . Breast cancer ([place])   . GERD (gastroesophageal reflux disease)   . Bilateral breast cancer [facility])     Past Surgical History  Procedure Laterality Date  . Abdominal hysterectomy  1990  . Tubal ligation  1984  . Mastectomy w/ sentinel node biopsy Right   . Mastectomy modified radical Left 05/09/2015  . Mastectomy w/ sentinel node biopsy Right 05/09/2015    Procedure: RIGHT MASTECTOMY WITH RIGHT AXILLARY SENTINEL LYMPH NODE BIOPSY;  Surgeon: Autumn Messing III, MD;  Location: [place];  Service: General;  Laterality: Right;  . Mastectomy modified radical Left 05/09/2015    Procedure: LEFT MODIFIED RADICAL MASTETCTOMY;  Surgeon: Autumn Messing III, MD;  Location: [place];  Service: General;  Laterality: Left;  . Portacath placement Right 09/01/2015    Procedure: INSERTION PORT-A-CATH;  Surgeon: Autumn Messing III, MD;  Location: [place];  Service: General;  Laterality: Right;    There were no vitals filed for this visit.  Visit Diagnosis:  Lymphedema syndrome, postmastectomy  Stiffness of joint, shoulder region, left      Subjective Assessment - 11/03/15 1015    Subjective (p) The doppller was negative  Pt says she is ready o get wrapped today    Pertinent History (p) bilateral mastectomy in  October 2016 9 nodes removed from left side and 5 from the right side.   She has completed radition is now taking chemotherapy. She has it on Mondays every three weeks and has 2 more.  She has just started having tingling in hands and feet.  Otherwise she is doing OK    Patient Stated Goals (p) pt wants to be able to wash her back, will be able to open jars and will have the swelling down    Currently in Pain? (p) Yes   Pain Score (p) 7    Pain Location (p) Arm   Pain Orientation (p) Left   Pain Descriptors / Indicators (p) Discomfort   Aggravating Factors  (p) feels better with mobility    Multiple Pain Sites (p) Yes                         [facility] Adult PT Treatment/Exercise - 11/03/15 0001    Neck Exercises: Seated   Other Seated Exercise neck ROM and shoulder rolls    Manual Therapy   Manual Lymphatic Drainage (MLD) instructed in diaphragmatic breathing; short neck, superficial abdomen; right groin and axillo-inguinal anastomosis, and right chest directing toward that pathway.  Same on left side; all that in supine. .   Compression Bandaging Biotone applied to left arm then think stockinette, 2 elastomull to hand and all 5 fingers, 2 artiflex with thin foam at antecubital fossa. 1-6 and 3-10 cm short stretch  bandages from hand to axilla                 PT Education - 11/03/15 1059    Education provided Yes   Education Details Remove bandages on Saturday or if they fall down or are very uncomfortable please put everything in a bag and return to [place] at next visit  exercise hand, wrist, forearm and elbow while in bandages    Person(s) Educated Patient   Methods Explanation   Comprehension Verbalized understanding           Short Term Clinic Goals - 10/26/15 1436    CC Short Term Goal  #1   Title Patient with verbalize an understanding of lymphedema risk reduction precautions   Time 4   Period Weeks   Status New   CC Short Term Goal  #2   Title Patient  will be independent in a basic home exercise program so that she can tolerate the rest of chemo and perform her ADLs better   Time 4   Period Weeks   Status New   CC Short Term Goal  #3   Title Patient will be independent in self manual lymph drainage so she can begin to manage her lymphedema at home    Time 4   Period [place] - 10/26/15 1438    [place] Term Goal  #1   Title Patient will improve left and right shoulder abduction to 120 degrees so that she can perform ADLs with greater ease a home    Time 8   Period Weeks   Status New   CC Long Term Goal  #2   Title Patient will report mproved ability to do household tasks of cooking and cleaning with greater ease    Time 8   Period Weeks   Status New   CC Long Term Goal  #3   Title Patient will be know how to obtain and use compression garments for maintenance phase of treatment   Time 8   Period Weeks   Status New            Plan - 11/03/15 1100    Clinical Impression Statement Pt ready for compression bandaging and tolerated intital application without difficulty. She acknowledges precautions and to exericse arm with bandages on. Pt is progressing through treatment plan    Pt will benefit from skilled therapeutic intervention in order to improve on the following deficits Increased fascial restricitons;Impaired UE functional use;Obesity;Decreased activity tolerance;Decreased knowledge of precautions;Decreased skin integrity;Impaired perceived functional ability;Pain;Decreased scar mobility;Decreased knowledge of use of DME;Decreased mobility;Decreased strength;Increased edema;Postural dysfunction   Rehab Potential Good   Clinical Impairments Affecting Rehab Potential lymph nodes removed from both axilla, radiaton to left upper quarter, ongoing chemotherapy    PT Next Visit Plan  Assess results of intital bandaging.  manual lymph drainage to chest, Lt UE and back with scar  mobilizaion as needed.  continue Bandaging to left arm i, Evenually  get garments of some type for both arms . Begin dowel rod exercise    Consulted and Agree with Plan of Care Patient        Problem List Patient Active Problem List   Diagnosis Date Noted  . Bilateral breast cancer ([place]) 05/09/2015  . Breast cancer of upper-inner quadrant of right female breast ([place]) 04/27/2015  . Hypertension, essential 04/27/2015  . Breast  cancer of upper-outer quadrant of left female breast ([place]) 04/21/2015   Donato Heinz. Owens Shark PT   Norwood Levo 11/03/2015, 11:04 AM  [place], [place], [address] Phone: [phone]   Fax:  [phone]  Name: Margaret [name] MRN: [id number] Date of Birth: 30-Apr-1961

## 2015-11-03 NOTE — Therapy (Signed)
[place], [place], [address] Phone: [phone]   Fax:  [phone]  Physical Therapy Treatment  Patient Details  Name: Margaret [name] MRN: [id number] Date of Birth: 1961-01-28 Referring Provider: Gentry Fitz   Encounter Date: 11/03/2015      PT End of Session - 11/03/15 1100    Visit Number 3   Number of Visits 17   Date for PT Re-Evaluation 12/26/15   Activity Tolerance Patient tolerated treatment well   Behavior During Therapy [facility] for tasks assessed/performed      Past Medical History  Diagnosis Date  . Hypertension   . Breast cancer of upper-outer quadrant of left female breast ([place]) 04/21/2015  . Breast cancer ([place])   . GERD (gastroesophageal reflux disease)   . Bilateral breast cancer [facility])     Past Surgical History  Procedure Laterality Date  . Abdominal hysterectomy  1990  . Tubal ligation  1984  . Mastectomy w/ sentinel node biopsy Right   . Mastectomy modified radical Left 05/09/2015  . Mastectomy w/ sentinel node biopsy Right 05/09/2015    Procedure: RIGHT MASTECTOMY WITH RIGHT AXILLARY SENTINEL LYMPH NODE BIOPSY;  Surgeon: Autumn Messing III, MD;  Location: [place];  Service: General;  Laterality: Right;  . Mastectomy modified radical Left 05/09/2015    Procedure: LEFT MODIFIED RADICAL MASTETCTOMY;  Surgeon: Autumn Messing III, MD;  Location: [place];  Service: General;  Laterality: Left;  . Portacath placement Right 09/01/2015    Procedure: INSERTION PORT-A-CATH;  Surgeon: Autumn Messing III, MD;  Location: [place];  Service: General;  Laterality: Right;    There were no vitals filed for this visit.  Visit Diagnosis:  Lymphedema syndrome, postmastectomy  Stiffness of joint, shoulder region, left      Subjective Assessment - 11/03/15 1015    Subjective The doppller was negative  Pt says she is ready o get wrapped today    Pertinent History bilateral mastectomy in October  2016 9 nodes removed from left side and 5 from the right side.   She has completed radition is now taking chemotherapy. She has it on Mondays every three weeks and has 2 more.  She has just started having tingling in hands and feet.  Otherwise she is doing OK    Patient Stated Goals pt wants to be able to wash her back, will be able to open jars and will have the swelling down    Currently in Pain? Yes   Pain Score 7    Pain Location Arm   Pain Orientation Left   Pain Descriptors / Indicators Discomfort   Aggravating Factors  feels better with mobility    Multiple Pain Sites Yes                         [facility] Adult PT Treatment/Exercise - 11/03/15 0001    Neck Exercises: Seated   Other Seated Exercise neck ROM and shoulder rolls    Manual Therapy   Manual Lymphatic Drainage (MLD) instructed in diaphragmatic breathing; short neck, superficial abdomen; right groin and axillo-inguinal anastomosis, and right chest directing toward that pathway.  Same on left side; all that in supine. .   Compression Bandaging Biotone applied to left arm then think stockinette, 2 elastomull to hand and all 5 fingers, 2 artiflex with thin foam at antecubital fossa. 1-6 and 3-10 cm short stretch bandages from hand to axilla  PT Education - 11/03/15 1059    Education provided Yes   Education Details Remove bandages on Saturday or if they fall down or are very uncomfortable please put everything in a bag and return to [place] at next visit  exercise hand, wrist, forearm and elbow while in bandages    Person(s) Educated Patient   Methods Explanation   Comprehension Verbalized understanding           Short Term Clinic Goals - 10/26/15 1436    CC Short Term Goal  #1   Title Patient with verbalize an understanding of lymphedema risk reduction precautions   Time 4   Period Weeks   Status New   CC Short Term Goal  #2   Title Patient will be independent in a basic home  exercise program so that she can tolerate the rest of chemo and perform her ADLs better   Time 4   Period Weeks   Status New   CC Short Term Goal  #3   Title Patient will be independent in self manual lymph drainage so she can begin to manage her lymphedema at home    Time 4   Period [place] - 10/26/15 1438    [place] Term Goal  #1   Title Patient will improve left and right shoulder abduction to 120 degrees so that she can perform ADLs with greater ease a home    Time 8   Period Weeks   Status New   CC Long Term Goal  #2   Title Patient will report mproved ability to do household tasks of cooking and cleaning with greater ease    Time 8   Period Weeks   Status New   CC Long Term Goal  #3   Title Patient will be know how to obtain and use compression garments for maintenance phase of treatment   Time 8   Period Weeks   Status New            Plan - 11/03/15 1100    Clinical Impression Statement Pt ready for compression bandaging and tolerated intital application without difficulty. She acknowledges precautions and to exericse arm with bandages on. Pt is progressing through treatment plan    Pt will benefit from skilled therapeutic intervention in order to improve on the following deficits Increased fascial restricitons;Impaired UE functional use;Obesity;Decreased activity tolerance;Decreased knowledge of precautions;Decreased skin integrity;Impaired perceived functional ability;Pain;Decreased scar mobility;Decreased knowledge of use of DME;Decreased mobility;Decreased strength;Increased edema;Postural dysfunction   Rehab Potential Good   Clinical Impairments Affecting Rehab Potential lymph nodes removed from both axilla, radiaton to left upper quarter, ongoing chemotherapy    PT Next Visit Plan  Assess results of intital bandaging.  manual lymph drainage to chest, Lt UE and back with scar mobilizaion as needed.  continue  Bandaging to left arm i, Evenually  get garments of some type for both arms . Begin dowel rod exercise    Consulted and Agree with Plan of Care Patient        Problem List Patient Active Problem List   Diagnosis Date Noted  . Bilateral breast cancer ([place]) 05/09/2015  . Breast cancer of upper-inner quadrant of right female breast ([place]) 04/27/2015  . Hypertension, essential 04/27/2015  . Breast cancer of upper-outer quadrant of left female breast ([place]) 04/21/2015   Donato Heinz. Owens Shark, PT  11/03/2015, 11:09 AM  [place], [place], [address] Phone: [phone]   Fax:  [phone]  Name: Margaret [name] MRN: [id number] Date of Birth: September 13, 1960

## 2015-11-07 ENCOUNTER — Ambulatory Visit (HOSPITAL_BASED_OUTPATIENT_CLINIC_OR_DEPARTMENT_OTHER): Payer: BLUE CROSS/BLUE SHIELD | Admitting: Nurse Practitioner

## 2015-11-07 ENCOUNTER — Encounter: Payer: Self-pay | Admitting: Nurse Practitioner

## 2015-11-07 ENCOUNTER — Other Ambulatory Visit (HOSPITAL_BASED_OUTPATIENT_CLINIC_OR_DEPARTMENT_OTHER): Payer: BLUE CROSS/BLUE SHIELD

## 2015-11-07 VITALS — BP 140/67 | HR 88 | Temp 97.4°F | Resp 18 | Wt 289.7 lb

## 2015-11-07 DIAGNOSIS — C50912 Malignant neoplasm of unspecified site of left female breast: Secondary | ICD-10-CM

## 2015-11-07 DIAGNOSIS — K1329 Other disturbances of oral epithelium, including tongue: Secondary | ICD-10-CM

## 2015-11-07 DIAGNOSIS — C773 Secondary and unspecified malignant neoplasm of axilla and upper limb lymph nodes: Secondary | ICD-10-CM | POA: Diagnosis not present

## 2015-11-07 DIAGNOSIS — B370 Candidal stomatitis: Secondary | ICD-10-CM

## 2015-11-07 DIAGNOSIS — I890 Lymphedema, not elsewhere classified: Secondary | ICD-10-CM

## 2015-11-07 DIAGNOSIS — C50411 Malignant neoplasm of upper-outer quadrant of right female breast: Secondary | ICD-10-CM

## 2015-11-07 DIAGNOSIS — C50211 Malignant neoplasm of upper-inner quadrant of right female breast: Secondary | ICD-10-CM

## 2015-11-07 DIAGNOSIS — C50412 Malignant neoplasm of upper-outer quadrant of left female breast: Secondary | ICD-10-CM

## 2015-11-07 LAB — CBC WITH DIFFERENTIAL/PLATELET
BASO%: 0.7 % (ref 0.0–2.0)
Basophils Absolute: 0.1 10*3/uL (ref 0.0–0.1)
EOS%: 0.9 % (ref 0.0–7.0)
Eosinophils Absolute: 0.1 10*3/uL (ref 0.0–0.5)
HCT: 38.9 % (ref 34.8–46.6)
HGB: 12.7 g/dL (ref 11.6–15.9)
LYMPH%: 19.7 % (ref 14.0–49.7)
MCH: 29.1 pg (ref 25.1–34.0)
MCHC: 32.6 g/dL (ref 31.5–36.0)
MCV: 89.2 fL (ref 79.5–101.0)
MONO#: 0.6 10*3/uL (ref 0.1–0.9)
MONO%: 8.8 % (ref 0.0–14.0)
NEUT#: 5 10*3/uL (ref 1.5–6.5)
NEUT%: 69.9 % (ref 38.4–76.8)
Platelets: 231 10*3/uL (ref 145–400)
RBC: 4.36 10*6/uL (ref 3.70–5.45)
RDW: 16.4 % — ABNORMAL HIGH (ref 11.2–14.5)
WBC: 7.2 10*3/uL (ref 3.9–10.3)
lymph#: 1.4 10*3/uL (ref 0.9–3.3)

## 2015-11-07 LAB — COMPREHENSIVE METABOLIC PANEL
ALT: 24 U/L (ref 0–55)
AST: 15 U/L (ref 5–34)
Albumin: 3.4 g/dL — ABNORMAL LOW (ref 3.5–5.0)
Alkaline Phosphatase: 113 U/L (ref 40–150)
Anion Gap: 7 mEq/L (ref 3–11)
BUN: 16.6 mg/dL (ref 7.0–26.0)
CO2: 28 mEq/L (ref 22–29)
Calcium: 8.8 mg/dL (ref 8.4–10.4)
Chloride: 105 mEq/L (ref 98–109)
Creatinine: 0.8 mg/dL (ref 0.6–1.1)
EGFR: >90 mL/min/{1.73_m2} (ref >90)
Glucose: 97 mg/dl (ref 70–140)
Potassium: 3.4 mEq/L — ABNORMAL LOW (ref 3.5–5.1)
Sodium: 140 mEq/L (ref 136–145)
Total Bilirubin: 0.32 mg/dL (ref 0.20–1.20)
Total Protein: 6.9 g/dL (ref 6.4–8.3)

## 2015-11-07 MED ORDER — FLUCONAZOLE 100 MG PO TABS: 100.0000 mg | ORAL_TABLET | Freq: Every day | ORAL | Status: DC

## 2015-11-07 MED ORDER — LORAZEPAM 0.5 MG PO TABS: 0.5000 mg | ORAL_TABLET | Freq: Every evening | ORAL | Status: DC | PRN

## 2015-11-07 MED ORDER — CYCLOBENZAPRINE HCL 5 MG PO TABS: 5.0000 mg | ORAL_TABLET | Freq: Three times a day (TID) | ORAL | Status: DC | PRN

## 2015-11-07 NOTE — Progress Notes (Signed)
Margaret [name]  [phone] [phone]     ID: Margaret [name] DOB: 1961/03/22  MR#: [id number]  NID#:[id number]  Patient Care Team: Margaret Koch, MD as PCP - General (Internal Medicine) Margaret Messing III, MD as Consulting Physician (General Surgery) Margaret Cruel, MD as Consulting Physician (Oncology) Margaret Pray, MD as Consulting Physician (Radiation Oncology) Margaret Kaufmann, RN as Registered Nurse Margaret Germany, RN as Registered Nurse PCP: Margaret Koch, MD GYN: OTHER MD:  CHIEF COMPLAINT: Locally advanced estrogen receptor positive breast cancer; bilateral breast cancer  CURRENT TREATMENT: Adjuvant chemotherapy   BREAST CANCER HISTORY: From the original intake note:  Margaret [name]'s primary care physician retired sometime ago and her health maintenance has not been up-to-date. She has been receiving medical care through the emergency room and was seen in March 2015 following a head injury, then in August 2015 for lancing of an abscess. In December 2015 she noticed that her left breast looked a little bit smaller than her right. She brought this to the attention of friends and family over the next several months but the general feeling was that most people are not perfectly symmetrical. By the summer of this year she noticed that her nipple on the left was "going in". More recently she started developing pain in the left breast. She was evaluated for this in the emergency room 04/09/2015 at which time a left breast exam showed a deformed left breast with a large hard mass encompassing most of the breast, with nipple retraction. There was no nipple discharge or bleeding and no palpable adenopathy.  The patient was [facility] to [facility] and on 04/13/2015 she underwent bilateral diagnostic mammography with tomosynthesis as well as bilateral breast ultrasound. The breast density was category B. In the right breast at the 11:00 position there was a 1.3  cm irregular mass which by ultrasonography measured 1.3 cm.--In the left breast there was a 4 cm irregular mass at the 2:00 position associated with nipple retraction. There was a second, 1.7 cm area of architectural distortion at the 9:00 position. Both were palpable. By ultrasonography, the 4 cm irregular mass was noted, with a second mass measuring 2.5 cm by ultrasonography. Both axillae were benign.  On 04/19/2015 the patient underwent right breast upper outer quadrant biopsy, showing ([id number]) and invasive ductal carcinoma, grade 1, estrogen receptor 90% positive, progesterone receptor negative, with an MIB-1 of 5%, and no HER-2 amplification, the signals ratio being 1.26 and the number per cell 2.20.  On the same day, she underwent biopsy of the 2 left breast masses in question as well as a suspicious left axillary lymph node. All 3 biopsies showed invasive ductal carcinoma, grade 2, estrogen receptor 80-100% positive, progesterone receptor 80-100% positive, with the MIB-1 between 5 and 10%, and no HER-2 amplification, the signals ratio being between 1.05 and 1.13, and the number per cell between 2.10 and 2.25.  The patient's subsequent history is as detailed below  INTERVAL HISTORY: Margaret [name] returns today for follow-up of her bilateral breast cancer. Today is day 8, cycle 3 of cyclophosphamide and docetaxel, with neulasta given on day 2 of granulocyte support.   REVIEW OF SYSTEMS: Margaret [name] denies fevers, chills, nausea, or vomiting. She had diarrhea late last week, but this got under control with imodium use. Her appetite is down secondary to taste changes. She does not have thrush to her tongue, but it is sensitive. She has a coating to the sides of her mouth, however. She  has been using nystatin rinses several times daily. She is working with physical therapy for her left arm lymphedema.. She is still waiting on a compression sleeve. A detailed review of systems is otherwise stable.  PAST  MEDICAL HISTORY: Past Medical History  Diagnosis Date  . Hypertension   . Breast cancer of upper-outer quadrant of left female breast ([place]) 04/21/2015  . Breast cancer ([place])   . GERD (gastroesophageal reflux disease)   . Bilateral breast cancer ([place])     PAST SURGICAL HISTORY: Past Surgical History  Procedure Laterality Date  . Abdominal hysterectomy  1990  . Tubal ligation  1984  . Mastectomy w/ sentinel node biopsy Right   . Mastectomy modified radical Left 05/09/2015  . Mastectomy w/ sentinel node biopsy Right 05/09/2015    Procedure: RIGHT MASTECTOMY WITH RIGHT AXILLARY SENTINEL LYMPH NODE BIOPSY;  Surgeon: Margaret Messing III, MD;  Location: [place];  Service: General;  Laterality: Right;  . Mastectomy modified radical Left 05/09/2015    Procedure: LEFT MODIFIED RADICAL MASTETCTOMY;  Surgeon: Margaret Messing III, MD;  Location: [place];  Service: General;  Laterality: Left;  . Portacath placement Right 09/01/2015    Procedure: INSERTION PORT-A-CATH;  Surgeon: Margaret Messing III, MD;  Location: [place];  Service: General;  Laterality: Right;    FAMILY HISTORY Family History  Problem Relation Age of Onset  . Hypertension Mother   . Diabetes Mother     the patient has little information on her father. Her mother is living, currently age 35. She has one brother, 2 sisters. There is no history of breast or ovarian cancer in the family to her knowledge.   GYNECOLOGIC HISTORY:  No LMP recorded. Patient has had a hysterectomy.  menarche age 35, first live birth age 79. She is GX P4. She underwent a total abdominal hysterectomy with bilateral salpingo-oophorectomy at age 71. She did not take hormone replacement. She never used oral contraceptives.   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 She is not employed by [employer] but by a Set designer. She is single and lives with her significant other Margaret [name]. He is status post  renal transplant. The patient's daughter  Margaret [name] works as a Programmer, applications, as does her daughter  Margaret [name]. Daughter Margaret [name] works at [employer], as a call. All 3 live in [place]. The patient's son Marc Morgans Heinlein's lives in [place]. He prepares sets for shows The patient has 4 grandchildren, no great grandchildren. She attends a [employer] in [place].     ADVANCED DIRECTIVES: Not in place. At the initial clinic visit the patient was given the appropriate forms to complete and notarize at her discretion. The patient intends to name her daughter Sampson Si as healthcare power of attorney. She can be reached at [phone].  HEALTH MAINTENANCE: Social History  Substance Use Topics  . Smoking status: Former Smoker -- 0.10 packs/day for 28 years  . Smokeless tobacco: Never Used  . Alcohol Use: No     Colonoscopy: Never  PAP: Status post hysterectomy  Bone density: Never  Lipid panel:  Allergies  Allergen Reactions  . Latex Itching and Other (See Comments)    burning  . 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 Wheat Bran Hives    Current Outpatient Prescriptions  Medication Sig Dispense Refill  . acetaminophen (TYLENOL) 500 MG  tablet Take 1,000 mg by mouth every 6 (six) hours as needed for mild pain. Reported on 10/31/2015    . cyclobenzaprine (FLEXERIL) 5 MG tablet Take 1 tablet (5 mg total) by mouth 3 (three) times daily as needed for muscle spasms. 30 tablet 0  . gabapentin (NEURONTIN) 300 MG capsule Take 1 capsule (300 mg total) by mouth 3 (three) times daily. 90 capsule 2  . hydrochlorothiazide (HYDRODIURIL) 12.5 MG tablet Take 1 tablet (12.5 mg total) by mouth daily. 30 tablet 3  . lidocaine-prilocaine (EMLA) cream Apply to affected area once 30 g 3  . losartan (COZAAR) 50 MG tablet Take 1 tablet (50 mg total) by mouth daily. 30 tablet 1  . nystatin (MYCOSTATIN) 100000 UNIT/ML suspension Take 5 mLs (500,000  Units total) by mouth 4 (four) times daily. 240 mL 0  . pantoprazole (PROTONIX) 40 MG tablet Take 1 tablet (40 mg total) by mouth daily. 30 tablet 1  . valACYclovir (VALTREX) 500 MG tablet Take 1 tablet (500 mg total) by mouth daily. 30 tablet 1  . venlafaxine XR (EFFEXOR XR) 75 MG 24 hr capsule Take 1 capsule (75 mg total) by mouth daily with breakfast. 90 capsule 2  . dexamethasone (DECADRON) 4 MG tablet Take 2 tablets (8 mg total) by mouth 2 (two) times daily. Start the day before Taxotere. Then again the day after chemo for 3 days. (Patient not taking: Reported on 10/17/2015) 30 tablet 1  . fluconazole (DIFLUCAN) 100 MG tablet Take 1 tablet (100 mg total) by mouth daily. 30 tablet 1  . ibuprofen (ADVIL,MOTRIN) 200 MG tablet Take 800-1,000 mg by mouth 3 (three) times daily as needed for headache. Reported on 11/07/2015    . LORazepam (ATIVAN) 0.5 MG tablet Take 1 tablet (0.5 mg total) by mouth at bedtime as needed (Nausea or vomiting). 30 tablet 0  . naproxen (NAPROSYN) 500 MG tablet Take 1 tablet (500 mg total) by mouth 2 (two) times daily with a meal. (Patient not taking: Reported on 11/07/2015) 60 tablet 1  . ondansetron (ZOFRAN) 8 MG tablet Take 1 tablet (8 mg total) by mouth 2 (two) times daily. Start the day after chemo for 3 days. Then take as needed for nausea or vomiting. (Patient not taking: Reported on 10/10/2015) 30 tablet 1  . oxyCODONE-acetaminophen (ROXICET) 5-325 MG tablet Take 1-2 tablets by mouth every 4 (four) hours as needed. (Patient not taking: Reported on 09/15/2015) 30 tablet 0  . prochlorperazine (COMPAZINE) 10 MG tablet Take 1 tablet (10 mg total) by mouth every 6 (six) hours as needed (Nausea or vomiting). (Patient not taking: Reported on 10/10/2015) 30 tablet 1   No current facility-administered medications for this visit.    OBJECTIVE: Middle-aged African-American woman who appears stated age 55 Vitals:   11/07/15 1147  BP: 140/67  Pulse: 88  Temp: 97.4 F (36.3 C)    Resp: 18     Body mass index is 42.76 kg/(m^2).    ECOG FS:1 - Symptomatic but completely ambulatory   Skin: warm, dry  HEENT: sclerae anicteric, conjunctivae pink. No thrush coating to sides of buccal mucosa. Tongue clear.  Lymph Nodes: No cervical or supraclavicular lymphadenopathy  Lungs: clear to auscultation bilaterally, no rales, wheezes, or rhonci  Heart: regular rate and rhythm  Abdomen: round, soft, non tender, positive bowel sounds  Musculoskeletal: No focal spinal tenderness, left upper arm lymphedema  Neuro: non focal, well oriented, positive affect  Breasts: deferred  LAB RESULTS:  CMP  Component Value Date/Time   NA 140 11/07/2015 1146   NA 136 06/29/2015 0923   K 3.4* 11/07/2015 1146   K 4.0 06/29/2015 0923   CL 102 06/29/2015 0923   CO2 28 11/07/2015 1146   CO2 24 06/29/2015 0923   GLUCOSE 97 11/07/2015 1146   GLUCOSE 107* 06/29/2015 0923   BUN 16.6 11/07/2015 1146   BUN 7 06/29/2015 0923   CREATININE 0.8 11/07/2015 1146   CREATININE 0.84 06/29/2015 0923   CALCIUM 8.8 11/07/2015 1146   CALCIUM 9.2 06/29/2015 0923   PROT 6.9 11/07/2015 1146   PROT 7.8 06/29/2015 0923   ALBUMIN 3.4* 11/07/2015 1146   ALBUMIN 3.5 06/29/2015 0923   AST 15 11/07/2015 1146   AST 20 06/29/2015 0923   ALT 24 11/07/2015 1146   ALT 21 06/29/2015 0923   ALKPHOS 113 11/07/2015 1146   ALKPHOS 87 06/29/2015 0923   BILITOT 0.32 11/07/2015 1146   BILITOT 1.0 06/29/2015 0923   GFRNONAA >60 06/29/2015 0923   GFRAA >60 06/29/2015 0923    INo results found for: SPEP, UPEP  Lab Results  Component Value Date   WBC 7.2 11/07/2015   NEUTROABS 5.0 11/07/2015   HGB 12.7 11/07/2015   HCT 38.9 11/07/2015   MCV 89.2 11/07/2015   PLT 231 11/07/2015      Chemistry      Component Value Date/Time   NA 140 11/07/2015 1146   NA 136 06/29/2015 0923   K 3.4* 11/07/2015 1146   K 4.0 06/29/2015 0923   CL 102 06/29/2015 0923   CO2 28 11/07/2015 1146   CO2 24 06/29/2015 0923   BUN  16.6 11/07/2015 1146   BUN 7 06/29/2015 0923   CREATININE 0.8 11/07/2015 1146   CREATININE 0.84 06/29/2015 0923      Component Value Date/Time   CALCIUM 8.8 11/07/2015 1146   CALCIUM 9.2 06/29/2015 0923   ALKPHOS 113 11/07/2015 1146   ALKPHOS 87 06/29/2015 0923   AST 15 11/07/2015 1146   AST 20 06/29/2015 0923   ALT 24 11/07/2015 1146   ALT 21 06/29/2015 0923   BILITOT 0.32 11/07/2015 1146   BILITOT 1.0 06/29/2015 0923       No results found for: LABCA2  No components found for: OINOM767  No results for input(s): INR in the last 168 hours.  Urinalysis    Component Value Date/Time   COLORURINE YELLOW 06/29/2015 1030   APPEARANCEUR CLOUDY* 06/29/2015 1030   LABSPEC 1.019 06/29/2015 1030   PHURINE 7.0 06/29/2015 1030   GLUCOSEU NEGATIVE 06/29/2015 1030   HGBUR TRACE* 06/29/2015 1030   BILIRUBINUR NEGATIVE 06/29/2015 1030   KETONESUR NEGATIVE 06/29/2015 1030   PROTEINUR NEGATIVE 06/29/2015 1030   NITRITE NEGATIVE 06/29/2015 1030   LEUKOCYTESUR MODERATE* 06/29/2015 1030    STUDIES: No results found.   ASSESSMENT: 55 y.o. [place] woman with synchronous breast cancers, as follows  (a) status post right breast upper outer quadrant biopsy 04/19/2015 for a clinical pT1c N0, stage IA invasive ductal carcinoma, grade 1, estrogen receptor positive, progesterone receptor negative, with an MIB-1 of 5%, and no HER-2 amplification  (b) status post left breast biopsy 2 and left axillary lymph node biopsy 04/19/2015 for a clinical T3 N1, stage IIIA invasive ductal carcinoma, grade 1 or 2, estrogen receptor and progesterone receptor positive, HER-2 negative, with an MIB-1 between 5 and 10%  (1) status post bilateral mastectomies 05/09/2015, showing:   (a) on the right side, a pT2 pN0, stage IIA invasive ductal carcinoma, with  negative margins and repeat HER-2 again negative    (b) on the left, a pT3 pN2, stage IIIA invasive ductal carcinoma, grade 2, with negative margins, and  repeat HER-2 again negative  (2) Mammaprint from the left-sided tumor returned "luminal type, low risk", predicting a small benefit from chemotherapy with the important caveat that N2 disease was not included in the MINDACT study  (3) Oncotype from the right-sided tumor showed a score of 12, predicting an 8% risk of recurrence outside the breast within the next 10 years, if the patient's only systemic therapy was tamoxifen for 5 years. It also predicted no significant benefit from chemotherapy  (4) postmastectomy radiation on the left to be completed 08/16/2014  (5) adjuvant chemotherapy to consist of cyclophosphamide and docetaxel x4 starting 09/19/15  (5) 10 years of antiestrogen therapy to follow with consideration of the PALLAS trial  PLAN: Davetta is a bit worn out today, but her symptoms otherwise are minimal. I am stopping the nystatin rinses, as they have not be effective. I am putting her on fluconazole 171m daily instead. I have also refilled her ativan and flexeril prescriptions during this visit.   TOluwadamilolawill return in 2 weeks for her 4th and final cycle of treatment. She understands and agrees with this plan. She knows the goal of treatment in her case is cure. She has been encouraged to call with any issues that might arise before her next visit here.   HLaurie Panda NP   11/07/2015 12:30 PM

## 2015-11-08 ENCOUNTER — Ambulatory Visit: Payer: BLUE CROSS/BLUE SHIELD | Attending: Nurse Practitioner

## 2015-11-08 DIAGNOSIS — M25612 Stiffness of left shoulder, not elsewhere classified: Secondary | ICD-10-CM | POA: Diagnosis present

## 2015-11-08 DIAGNOSIS — R293 Abnormal posture: Secondary | ICD-10-CM | POA: Diagnosis present

## 2015-11-08 DIAGNOSIS — M25611 Stiffness of right shoulder, not elsewhere classified: Secondary | ICD-10-CM | POA: Insufficient documentation

## 2015-11-08 DIAGNOSIS — I972 Postmastectomy lymphedema syndrome: Secondary | ICD-10-CM | POA: Diagnosis not present

## 2015-11-08 NOTE — Therapy (Signed)
[place], [place], [address] Phone: [phone]   Fax:  [phone]  Physical Therapy Treatment  Patient Details  Name: ANAHAT OMANA MRN: [id number] Date of Birth: 01-27-1961 Referring Provider: Gentry Fitz   Encounter Date: 11/08/2015      PT End of Session - 11/08/15 1021    Visit Number 4   Number of Visits 17   Date for PT Re-Evaluation 12/26/15   PT Start Time 0932   PT Stop Time 1019   PT Time Calculation (min) 47 min   Activity Tolerance Patient tolerated treatment well   Behavior During Therapy [facility] for tasks assessed/performed      Past Medical History  Diagnosis Date  . Hypertension   . Breast cancer of upper-outer quadrant of left female breast ([place]) 04/21/2015  . Breast cancer ([place])   . GERD (gastroesophageal reflux disease)   . Bilateral breast cancer [facility])     Past Surgical History  Procedure Laterality Date  . Abdominal hysterectomy  1990  . Tubal ligation  1984  . Mastectomy w/ sentinel node biopsy Right   . Mastectomy modified radical Left 05/09/2015  . Mastectomy w/ sentinel node biopsy Right 05/09/2015    Procedure: RIGHT MASTECTOMY WITH RIGHT AXILLARY SENTINEL LYMPH NODE BIOPSY;  Surgeon: Autumn Messing III, MD;  Location: [place];  Service: General;  Laterality: Right;  . Mastectomy modified radical Left 05/09/2015    Procedure: LEFT MODIFIED RADICAL MASTETCTOMY;  Surgeon: Autumn Messing III, MD;  Location: [place];  Service: General;  Laterality: Left;  . Portacath placement Right 09/01/2015    Procedure: INSERTION PORT-A-CATH;  Surgeon: Autumn Messing III, MD;  Location: [place];  Service: General;  Laterality: Right;    There were no vitals filed for this visit.  Visit Diagnosis:  Lymphedema syndrome, postmastectomy      Subjective Assessment - 11/08/15 0937    Subjective I kept my bandages on until Sunday and my arm was smaller when I took them off. They  felt fine while I was wearing them.    Pertinent History bilateral mastectomy in October 2016 9 nodes removed from left side and 5 from the right side.   She has completed radition is now taking chemotherapy. She has it on Mondays every three weeks and has 2 more.  She has just started having tingling in hands and feet.  Otherwise she is doing OK    Patient Stated Goals pt wants to be able to wash her back, will be able to open jars and will have the swelling down    Currently in Pain? No/denies                         [facility] Adult PT Treatment/Exercise - 11/08/15 0001    Manual Therapy   Manual Lymphatic Drainage (MLD) In Supine: Short neck, superficial and deep abdominals, Lt inguinal nodes and Lt axillo-inguinal anastomosis and Lt UE from dorsal hand to lateral shoulder.    Compression Bandaging Biotone applied to left arm then thin stockinette, 2 elastomull to hand and all 5 fingers, 2 artiflex with thin foam at antecubital fossa. 1-6 and 2-10 cm short stretch bandages from hand to axilla( pt wanted 2-10 today reporting the third felt too tight)                   [facility] Term Clinic Goals - 10/26/15 1436    CC Short Term  Goal  #1   Title Patient with verbalize an understanding of lymphedema risk reduction precautions   Time 4   Period Weeks   Status New   CC Short Term Goal  #2   Title Patient will be independent in a basic home exercise program so that she can tolerate the rest of chemo and perform her ADLs better   Time 4   Period Weeks   Status New   CC Short Term Goal  #3   Title Patient will be independent in self manual lymph drainage so she can begin to manage her lymphedema at home    Time 4   Period [facility] Goals - 10/26/15 1438    [place] Term Goal  #1   Title Patient will improve left and right shoulder abduction to 120 degrees so that she can perform ADLs with greater ease a home    Time 8    Period Weeks   Status New   CC Long Term Goal  #2   Title Patient will report mproved ability to do household tasks of cooking and cleaning with greater ease    Time 8   Period Weeks   Status New   CC Long Term Goal  #3   Title Patient will be know how to obtain and use compression garments for maintenance phase of treatment   Time 8   Period Weeks   Status New            Plan - 11/08/15 1021    Clinical Impression Statement Pt tolerated initial bandaging very well reporting that they felt fine and were comfortable. She noticed good reductions upon doffing them on Sunday as well. Did not want to wear the third 10-cm compression bandage today as it felt tight.    Pt will benefit from skilled therapeutic intervention in order to improve on the following deficits Increased fascial restricitons;Impaired UE functional use;Obesity;Decreased activity tolerance;Decreased knowledge of precautions;Decreased skin integrity;Impaired perceived functional ability;Pain;Decreased scar mobility;Decreased knowledge of use of DME;Decreased mobility;Decreased strength;Increased edema;Postural dysfunction   Rehab Potential Good   Clinical Impairments Affecting Rehab Potential lymph nodes removed from both axilla, radiaton to left upper quarter, ongoing chemotherapy    PT Frequency 2x / week   PT Duration 8 weeks   PT Treatment/Interventions Passive range of motion;Manual lymph drainage;Manual techniques;Therapeutic exercise   PT Next Visit Plan Measure circumference next visit. Begin dowel rod exercise. Manual lymph drainage to chest, Lt UE and back with scar mobilizaion as needed.  continue Bandaging to left arm; Evenually get garments of some type for both arms.   Consulted and Agree with Plan of Care Patient        Problem List Patient Active Problem List   Diagnosis Date Noted  . Bilateral breast cancer ([place]) 05/09/2015  . Breast cancer of upper-inner quadrant of right female breast ([place])  04/27/2015  . Hypertension, essential 04/27/2015  . Breast cancer of upper-outer quadrant of left female breast ([place]) 04/21/2015    Otelia Limes, PTA 11/08/2015, 10:28 AM  [place], [place], [address] Phone: [phone]   Fax:  [phone]  Name: ALEIGHYA FENERTY MRN: [id number] Date of Birth: May 18, 1961

## 2015-11-10 ENCOUNTER — Ambulatory Visit: Payer: BLUE CROSS/BLUE SHIELD

## 2015-11-10 ENCOUNTER — Encounter: Payer: BLUE CROSS/BLUE SHIELD | Admitting: Physical Therapy

## 2015-11-10 DIAGNOSIS — I972 Postmastectomy lymphedema syndrome: Secondary | ICD-10-CM

## 2015-11-10 DIAGNOSIS — R293 Abnormal posture: Secondary | ICD-10-CM

## 2015-11-10 DIAGNOSIS — M25611 Stiffness of right shoulder, not elsewhere classified: Secondary | ICD-10-CM

## 2015-11-10 DIAGNOSIS — M25612 Stiffness of left shoulder, not elsewhere classified: Secondary | ICD-10-CM

## 2015-11-10 NOTE — Patient Instructions (Signed)
Cancer Rehab [phone] SHOULDER: Flexion - Supine (Cane)    Hold cane in both hands. Raise arms up overhead. Do not allow back to arch. Hold _5__ seconds. _10__ reps per set, _2__ sets per day.    Copyright  VHI. All rights reserved.   Cane Exercise: Abduction / Adduction    Hold cane palms down. Keeping back flat, move cane side to side over chest. Hold _5___ seconds each side. Repeat __10__ times. Do __2__ sessions per day.  Once that becomes easier, start bringing arm around side towards ear.   http://[email or web address].exer.us/90   Copyright  VHI. All rights reserved.   SHOULDER: External Rotation - Supine (Cane)    Hold cane with both hands. Rotate arm away from body. Keep elbow on floor and next to body. Hold 5 seconds, _10__ reps per set, _2__ sets per day. Add towel to keep elbow at side if having trouble keeping elbow at side. Remember just moving cane side to side.   Copyright  VHI. All rights reserved.

## 2015-11-10 NOTE — Therapy (Signed)
[place], [place], [address] Phone: [phone]   Fax:  [phone]  Physical Therapy Treatment  Patient Details  Name: Margaret [name] MRN: [id number] Date of Birth: [birth year] Referring Provider: Gentry Fitz   Encounter Date: 11/10/2015      PT End of Session - 11/10/15 1101    Visit Number 5   Number of Visits 17   Date for PT Re-Evaluation 12/26/15   PT Start Time 0950   PT Stop Time 1051   PT Time Calculation (min) 61 min   Activity Tolerance Patient tolerated treatment well   Behavior During Therapy [facility] for tasks assessed/performed      Past Medical History  Diagnosis Date  . Hypertension   . Breast cancer of upper-outer quadrant of left female breast ([place]) 04/21/2015  . Breast cancer ([place])   . GERD (gastroesophageal reflux disease)   . Bilateral breast cancer [facility])     Past Surgical History  Procedure Laterality Date  . Abdominal hysterectomy  1990  . Tubal ligation  1984  . Mastectomy w/ sentinel node biopsy Right   . Mastectomy modified radical Left 05/09/2015  . Mastectomy w/ sentinel node biopsy Right 05/09/2015    Procedure: RIGHT MASTECTOMY WITH RIGHT AXILLARY SENTINEL LYMPH NODE BIOPSY;  Surgeon: Autumn Messing III, MD;  Location: [place];  Service: General;  Laterality: Right;  . Mastectomy modified radical Left 05/09/2015    Procedure: LEFT MODIFIED RADICAL MASTETCTOMY;  Surgeon: Autumn Messing III, MD;  Location: [place];  Service: General;  Laterality: Left;  . Portacath placement Right 09/01/2015    Procedure: INSERTION PORT-A-CATH;  Surgeon: Autumn Messing III, MD;  Location: [place];  Service: General;  Laterality: Right;    There were no vitals filed for this visit.  Visit Diagnosis:  Postmastectomy lymphedema  Abnormal posture  Stiffness of left shoulder, not elsewhere classified  Stiffness of right shoulder, not elsewhere classified      Subjective  Assessment - 11/10/15 0952    Subjective Took bandages off last night so I could shower, they felt fine.    Pertinent History bilateral mastectomy in October 2016 9 nodes removed from left side and 5 from the right side.   She has completed radition is now taking chemotherapy. She has it on Mondays every three weeks and has 2 more.  She has just started having tingling in hands and feet.  Otherwise she is doing OK    Patient Stated Goals pt wants to be able to wash her back, will be able to open jars and will have the swelling down    Currently in Pain? No/denies               LYMPHEDEMA/ONCOLOGY QUESTIONNAIRE - 11/10/15 0952    Left Upper Extremity Lymphedema   15 cm Proximal to Olecranon Process 46.9 cm   10 cm Proximal to Olecranon Process 43.5 cm   Olecranon Process 34.9 cm   15 cm Proximal to Ulnar Styloid Process 31.7 cm   10 cm Proximal to Ulnar Styloid Process 28.2 cm   Just Proximal to Ulnar Styloid Process 22.8 cm   Across Hand at [employer] 21.9 cm   At [place] of 2nd Digit 7.9 cm                  [facility] Adult PT Treatment/Exercise - 11/10/15 0001    Shoulder Exercises: Supine   Horizontal ABduction AAROM;Left;10 reps  5 second  holds with dowel rod   External Rotation AAROM;Both;10 reps  5 second holds with dowel rod   Flexion AAROM;Both;10 reps  5 second holds with dowel rod   Manual Therapy   Manual Lymphatic Drainage (MLD) In Supine: Short neck, superficial and deep abdominals, Lt inguinal nodes and Lt axillo-inguinal anastomosis and Lt UE from dorsal hand to lateral shoulder.    Compression Bandaging Biotone applied to left arm then thin stockinette, 1 elastomull to hand and  fingers 2-5, 2 artiflex with thin foam at antecubital fossa. 1-6 and 2-10 cm short stretch bandages from hand to axilla instructing pt throughout today.                 PT Education - 11/10/15 1100    Education provided Yes   Education Details Supine dowel rod  exercises and self compression bandaging.   Person(s) Educated Patient   Methods Explanation;Demonstration;Handout;Tactile cues   Comprehension Verbalized understanding;Returned demonstration;Need further instruction  Returned demo of AAROM; will practice bandaging           Short Term Clinic Goals - 10/26/15 1436    CC Short Term Goal  #1   Title Patient with verbalize an understanding of lymphedema risk reduction precautions   Time 4   Period Weeks   Status New   CC Short Term Goal  #2   Title Patient will be independent in a basic home exercise program so that she can tolerate the rest of chemo and perform her ADLs better   Time 4   Period Weeks   Status New   CC Short Term Goal  #3   Title Patient will be independent in self manual lymph drainage so she can begin to manage her lymphedema at home    Time 4   Period [facility] Goals - 11/10/15 1113    [place] Term Goal  #1   Title Patient will improve left and right shoulder abduction to 120 degrees so that she can perform ADLs with greater ease a home   Issued AAROM exs today, 11/10/15   Status On-going   CC Long Term Goal  #2   Title Patient will report mproved ability to do household tasks of cooking and cleaning with greater ease    Status On-going   CC Long Term Goal  #3   Title Patient will be know how to obtain and use compression garments for maintenance phase of treatment   Status On-going            Plan - 11/10/15 1109    Clinical Impression Statement Pt reported she has a wedding this weekend and a dress fitting today so wanted to learn bandaging as she would need to take bandages off a few times before next visit but didnt want to be without compression long. She did very well with initial instruction and feels confiident she can replicate this with the assist of her husband. Other than her hand her circumference measurmeents had reduced well today. And issued  AAROM supine cane exercises and pt did well with these reporting she felt good after doing them.    Pt will benefit from skilled therapeutic intervention in order to improve on the following deficits Increased fascial restricitons;Impaired UE functional use;Obesity;Decreased activity tolerance;Decreased knowledge of precautions;Decreased skin integrity;Impaired perceived functional ability;Pain;Decreased scar mobility;Decreased knowledge of use of DME;Decreased mobility;Decreased strength;Increased edema;Postural dysfunction  Rehab Potential Good   Clinical Impairments Affecting Rehab Potential lymph nodes removed from both axilla, radiaton to left upper quarter, ongoing chemotherapy    PT Frequency 2x / week   PT Duration 8 weeks   PT Treatment/Interventions Passive range of motion;Manual lymph drainage;Manual techniques;Therapeutic exercise   PT Next Visit Plan Assess dowel rod exercise and pts bandaging technique.Manual lymph drainage to chest, Lt UE and back with scar mobilizaion as needed.  continue Bandaging to left arm; Evenually get garments of some type for both arms.   PT Home Exercise Plan See education section   Consulted and Agree with Plan of Care Patient        Problem List Patient Active Problem List   Diagnosis Date Noted  . Bilateral breast cancer ([place]) 05/09/2015  . Breast cancer of upper-inner quadrant of right female breast ([place]) 04/27/2015  . Hypertension, essential 04/27/2015  . Breast cancer of upper-outer quadrant of left female breast ([place]) 04/21/2015    Otelia Limes, PTA 11/10/2015, 11:14 AM  [place], [place], [address] Phone: [phone]   Fax:  [phone]  Name: EVLYNN DUNSWORTH MRN: [id number] Date of Birth: November 06, 1960

## 2015-11-15 ENCOUNTER — Encounter: Payer: BLUE CROSS/BLUE SHIELD | Admitting: Physical Therapy

## 2015-11-17 ENCOUNTER — Ambulatory Visit: Payer: BLUE CROSS/BLUE SHIELD | Admitting: Physical Therapy

## 2015-11-17 ENCOUNTER — Encounter: Payer: BLUE CROSS/BLUE SHIELD | Admitting: Physical Therapy

## 2015-11-17 DIAGNOSIS — M25612 Stiffness of left shoulder, not elsewhere classified: Secondary | ICD-10-CM

## 2015-11-17 DIAGNOSIS — I972 Postmastectomy lymphedema syndrome: Secondary | ICD-10-CM

## 2015-11-17 DIAGNOSIS — R293 Abnormal posture: Secondary | ICD-10-CM

## 2015-11-17 DIAGNOSIS — M25611 Stiffness of right shoulder, not elsewhere classified: Secondary | ICD-10-CM

## 2015-11-17 NOTE — Therapy (Signed)
[place], [place], [address] Phone: [phone]   Fax:  [phone]  Physical Therapy Treatment  Patient Details  Name: Margaret [name] MRN: [id number] Date of Birth: 21-Feb-1961 Referring Provider: Gentry Fitz   Encounter Date: 11/17/2015      PT End of Session - 11/17/15 1135    Visit Number 6   Number of Visits 17   Date for PT Re-Evaluation 12/26/15   Authorization Type April recert done   PT Start Time 1025   PT Stop Time 1115   PT Time Calculation (min) 50 min   Activity Tolerance Patient tolerated treatment well   Behavior During Therapy [facility] for tasks assessed/performed      Past Medical History  Diagnosis Date  . Hypertension   . Breast cancer of upper-outer quadrant of left female breast ([place]) 04/21/2015  . Breast cancer ([place])   . GERD (gastroesophageal reflux disease)   . Bilateral breast cancer [facility])     Past Surgical History  Procedure Laterality Date  . Abdominal hysterectomy  1990  . Tubal ligation  1984  . Mastectomy w/ sentinel node biopsy Right   . Mastectomy modified radical Left 05/09/2015  . Mastectomy w/ sentinel node biopsy Right 05/09/2015    Procedure: RIGHT MASTECTOMY WITH RIGHT AXILLARY SENTINEL LYMPH NODE BIOPSY;  Surgeon: Autumn Messing III, MD;  Location: [place];  Service: General;  Laterality: Right;  . Mastectomy modified radical Left 05/09/2015    Procedure: LEFT MODIFIED RADICAL MASTETCTOMY;  Surgeon: Autumn Messing III, MD;  Location: [place];  Service: General;  Laterality: Left;  . Portacath placement Right 09/01/2015    Procedure: INSERTION PORT-A-CATH;  Surgeon: Autumn Messing III, MD;  Location: [place];  Service: General;  Laterality: Right;    There were no vitals filed for this visit.      Subjective Assessment - 11/17/15 1025    Subjective pt got mixed up with appointments and that is why she missed last session.  She has been doing shoulder  exercises daily.  Bandages have been off since Sunday thinking she was coming here on Tuesay and then got mixed up.  Just one more chemo on April 17.  She says the  tingling in her hand has subsided    Pertinent History bilateral mastectomy in October 2016 9 nodes removed from left side and 5 from the right side.   She has completed radition is now taking chemotherapy. She has it on Mondays every three weeks and has 2 more.  She has just started having tingling in hands and feet.  Otherwise she is doing OK    Patient Stated Goals pt wants to be able to wash her back, will be able to open jars and will have the swelling down    Currently in Pain? No/denies               LYMPHEDEMA/ONCOLOGY QUESTIONNAIRE - 11/17/15 1140    Left Upper Extremity Lymphedema   15 cm Proximal to Olecranon Process 48 cm   10 cm Proximal to Olecranon Process 45.7 cm   Olecranon Process 34.5 cm   15 cm Proximal to Ulnar Styloid Process 34 cm   10 cm Proximal to Ulnar Styloid Process 29 cm   Just Proximal to Ulnar Styloid Process 21.9 cm   Across Hand at [employer] 22.5 cm   At [place] of 2nd Digit 8 cm   Other fullness aorund mastectomy scars especially at  left chest with darkening at skin from radiation. She also reports intermittent fullness at back at times and has visible skin folds under both arms at lateral trunk.                   [place] Adult PT Treatment/Exercise - 11/17/15 0001    Shoulder Exercises: Supine   Other Supine Exercises AROM for supine bilateral shoulder flexion with dowel rod    Manual Therapy   Manual Lymphatic Drainage (MLD) In Supine: Short neck, superficial and deep abdominals, Lt inguinal nodes and Lt axillo-inguinal anastomosis and Lt UE from dorsal hand to lateral shoulder.    Compression Bandaging Biotone applied to left arm then thick  stockinette, 2 elastomull to hand and  fingers 2-5, 2 artiflex with thin foam at antecubital fossa. 1-6 and 2-10 cm short stretch  bandages from hand to axilla                     Short Term Clinic Goals - 11/17/15 1155    CC Short Term Goal  #1   Title Patient with verbalize an understanding of lymphedema risk reduction precautions   Time 6   Period Weeks   Status On-going   CC Short Term Goal  #2   Title Patient will be independent in a basic home exercise program so that she can tolerate the rest of chemo and perform her ADLs better   Status Achieved   CC Short Term Goal  #3   Title Patient will be independent in self manual lymph drainage so she can begin to manage her lymphedema at home    Time 6   Period Weeks   Status On-going             [place] - 11/17/15 1155    [place] Term Goal  #1   Title Patient will improve left and right shoulder abduction to 120 degrees so that she can perform ADLs with greater ease a home    Time 6   Status On-going   CC Long Term Goal  #2   Title Patient will report mproved ability to do household tasks of cooking and cleaning with greater ease    Time 6   Period Weeks   Status On-going   CC Long Term Goal  #3   Title Patient will be know how to obtain and use compression garments for maintenance phase of treatment   Time 6   Period Weeks   Status On-going            Plan - 11/17/15 1143    Clinical Impression Statement Pt continues with persistent lymphedema especially in left hand and forearm that is not readily reducing with conservative measures and compression wrapping. Edema in chest and back is present and not easily measured, but is noticeble to patient.  Anticipate she will need ongoing effort and treatment at this clnic and at home to manage her symtoms.  She will need a compression pump for  home management.  She has had lymph nodes removed from both axilla and has lymphedema in both arms and trunk so recommend the Biotab.  16 chamber bilateral arm and chest  garment for most efficient pumping. Pt viewed this option on my  computer and chooses it as the one she would like since she will most likely need  have ~ 1 hour pump treatment to each arm.  We began discussion about nightime garments, and circular  knit garment that she would like to start with, but will delay pursuing those until she finished chemo therpy.  She received information about the [employer] progam and will call to enroll in that for exercise for srength and weight management  Demographics sent o Biotab   Rehab Potential Good   Clinical Impairments Affecting Rehab Potential lymph nodes removed from both axilla, radiaton to left upper quarter, ongoing chemotherapy    PT Frequency 3x / week  2-3 x/ week    PT Duration 6 weeks   PT Treatment/Interventions Passive range of motion;Manual lymph drainage;Manual techniques;Therapeutic exercise;Compression bandaging;Taping;ADLs/Self Care Home Management;DME Instruction   PT Next Visit Plan complete decongestive therapy with increase frequency to 3x per week once chemo is complete and measure for garments when ready.  continue discussion about nighttime options    Recommended Other Services ABC class for lymphedema risk reduction education    Consulted and Agree with Plan of Care Patient      Patient will benefit from skilled therapeutic intervention in order to improve the following deficits and impairments:  Increased fascial restricitons, Impaired UE functional use, Obesity, Decreased activity tolerance, Decreased knowledge of precautions, Decreased skin integrity, Impaired perceived functional ability, Pain, Decreased scar mobility, Decreased knowledge of use of DME, Decreased mobility, Decreased strength, Increased edema, Postural dysfunction  Visit Diagnosis: Postmastectomy lymphedema - Plan: PT plan of care cert/re-cert  Abnormal posture - Plan: PT plan of care cert/re-cert  Stiffness of left shoulder, not elsewhere classified - Plan: PT plan of care cert/re-cert  Stiffness of right shoulder, not  elsewhere classified - Plan: PT plan of care cert/re-cert     Problem List Patient Active Problem List   Diagnosis Date Noted  . Bilateral breast cancer ([place]) 05/09/2015  . Breast cancer of upper-inner quadrant of right female breast ([place]) 04/27/2015  . Hypertension, essential 04/27/2015  . Breast cancer of upper-outer quadrant of left female breast ([place]) 04/21/2015   Donato Heinz. Owens Shark, PT  11/17/2015, 12:04 PM  [place], [place], [address] Phone: [phone]   Fax:  [phone]  Name: Margaret [name] MRN: [id number] Date of Birth: 11/13/1960

## 2015-11-21 ENCOUNTER — Ambulatory Visit (HOSPITAL_BASED_OUTPATIENT_CLINIC_OR_DEPARTMENT_OTHER): Payer: BLUE CROSS/BLUE SHIELD

## 2015-11-21 ENCOUNTER — Other Ambulatory Visit: Payer: Self-pay | Admitting: Oncology

## 2015-11-21 ENCOUNTER — Telehealth: Payer: Self-pay | Admitting: Oncology

## 2015-11-21 ENCOUNTER — Other Ambulatory Visit (HOSPITAL_BASED_OUTPATIENT_CLINIC_OR_DEPARTMENT_OTHER): Payer: BLUE CROSS/BLUE SHIELD

## 2015-11-21 ENCOUNTER — Ambulatory Visit (HOSPITAL_BASED_OUTPATIENT_CLINIC_OR_DEPARTMENT_OTHER): Payer: BLUE CROSS/BLUE SHIELD | Admitting: Oncology

## 2015-11-21 VITALS — BP 168/93 | HR 107 | Temp 98.1°F | Resp 20 | Ht 69.0 in | Wt 297.0 lb

## 2015-11-21 DIAGNOSIS — C50412 Malignant neoplasm of upper-outer quadrant of left female breast: Secondary | ICD-10-CM

## 2015-11-21 DIAGNOSIS — C50211 Malignant neoplasm of upper-inner quadrant of right female breast: Secondary | ICD-10-CM

## 2015-11-21 DIAGNOSIS — C773 Secondary and unspecified malignant neoplasm of axilla and upper limb lymph nodes: Secondary | ICD-10-CM | POA: Diagnosis not present

## 2015-11-21 DIAGNOSIS — R0789 Other chest pain: Secondary | ICD-10-CM

## 2015-11-21 DIAGNOSIS — Z5111 Encounter for antineoplastic chemotherapy: Secondary | ICD-10-CM | POA: Diagnosis not present

## 2015-11-21 DIAGNOSIS — C50912 Malignant neoplasm of unspecified site of left female breast: Secondary | ICD-10-CM

## 2015-11-21 DIAGNOSIS — R635 Abnormal weight gain: Secondary | ICD-10-CM

## 2015-11-21 DIAGNOSIS — C50411 Malignant neoplasm of upper-outer quadrant of right female breast: Secondary | ICD-10-CM

## 2015-11-21 DIAGNOSIS — I890 Lymphedema, not elsewhere classified: Secondary | ICD-10-CM

## 2015-11-21 LAB — CBC WITH DIFFERENTIAL/PLATELET
BASO%: 0.2 % (ref 0.0–2.0)
Basophils Absolute: 0 10*3/uL (ref 0.0–0.1)
EOS%: 0 % (ref 0.0–7.0)
Eosinophils Absolute: 0 10*3/uL (ref 0.0–0.5)
HCT: 38.4 % (ref 34.8–46.6)
HGB: 12.3 g/dL (ref 11.6–15.9)
LYMPH%: 9.6 % — ABNORMAL LOW (ref 14.0–49.7)
MCH: 29 pg (ref 25.1–34.0)
MCHC: 31.9 g/dL (ref 31.5–36.0)
MCV: 90.7 fL (ref 79.5–101.0)
MONO#: 0.3 10*3/uL (ref 0.1–0.9)
MONO%: 2.2 % (ref 0.0–14.0)
NEUT#: 11.3 10*3/uL — ABNORMAL HIGH (ref 1.5–6.5)
NEUT%: 88 % — ABNORMAL HIGH (ref 38.4–76.8)
Platelets: 184 10*3/uL (ref 145–400)
RBC: 4.23 10*6/uL (ref 3.70–5.45)
RDW: 18 % — ABNORMAL HIGH (ref 11.2–14.5)
WBC: 12.9 10*3/uL — ABNORMAL HIGH (ref 3.9–10.3)
lymph#: 1.2 10*3/uL (ref 0.9–3.3)

## 2015-11-21 LAB — COMPREHENSIVE METABOLIC PANEL
ALT: 28 U/L (ref 0–55)
AST: 16 U/L (ref 5–34)
Albumin: 3.4 g/dL — ABNORMAL LOW (ref 3.5–5.0)
Alkaline Phosphatase: 92 U/L (ref 40–150)
Anion Gap: 10 mEq/L (ref 3–11)
BUN: 21 mg/dL (ref 7.0–26.0)
CO2: 23 mEq/L (ref 22–29)
Calcium: 9.6 mg/dL (ref 8.4–10.4)
Chloride: 109 mEq/L (ref 98–109)
Creatinine: 1.1 mg/dL (ref 0.6–1.1)
EGFR: 69 mL/min/{1.73_m2} — ABNORMAL LOW (ref >90)
Glucose: 194 mg/dl — ABNORMAL HIGH (ref 70–140)
Potassium: 4 mEq/L (ref 3.5–5.1)
Sodium: 141 mEq/L (ref 136–145)
Total Bilirubin: <0.3 mg/dL (ref 0.20–1.20)
Total Protein: 7.4 g/dL (ref 6.4–8.3)

## 2015-11-21 MED ORDER — SODIUM CHLORIDE 0.9 % IJ SOLN
10.0000 mL | INTRAMUSCULAR | Status: DC | PRN
  Administered 2015-11-21: 10 mL
  Filled 2015-11-21: qty 10

## 2015-11-21 MED ORDER — DEXAMETHASONE SODIUM PHOSPHATE 100 MG/10ML IJ SOLN
Freq: Once | INTRAMUSCULAR | Status: AC
  Administered 2015-11-21: 15:00:00 via INTRAVENOUS
  Filled 2015-11-21: qty 8

## 2015-11-21 MED ORDER — VALACYCLOVIR HCL 500 MG PO TABS: 500.0000 mg | ORAL_TABLET | Freq: Every day | ORAL | Status: DC

## 2015-11-21 MED ORDER — SODIUM CHLORIDE 0.9 % IV SOLN
Freq: Once | INTRAVENOUS | Status: AC
  Administered 2015-11-21: 15:00:00 via INTRAVENOUS

## 2015-11-21 MED ORDER — HEPARIN SOD (PORK) LOCK FLUSH 100 UNIT/ML IV SOLN
500.0000 [IU] | Freq: Once | INTRAVENOUS | Status: AC | PRN
  Administered 2015-11-21: 500 [IU]
  Filled 2015-11-21: qty 5

## 2015-11-21 MED ORDER — NAPROXEN 500 MG PO TABS: 500.0000 mg | ORAL_TABLET | Freq: Two times a day (BID) | ORAL | Status: DC

## 2015-11-21 MED ORDER — PANTOPRAZOLE SODIUM 40 MG PO TBEC: 40.0000 mg | DELAYED_RELEASE_TABLET | Freq: Every day | ORAL | Status: DC

## 2015-11-21 MED ORDER — DOCETAXEL CHEMO INJECTION 160 MG/16ML
75.0000 mg/m2 | Freq: Once | INTRAVENOUS | Status: AC
  Administered 2015-11-21: 190 mg via INTRAVENOUS
  Filled 2015-11-21: qty 19

## 2015-11-21 MED ORDER — PEGFILGRASTIM 6 MG/0.6ML ~~LOC~~ PSKT
6.0000 mg | PREFILLED_SYRINGE | Freq: Once | SUBCUTANEOUS | Status: AC
  Administered 2015-11-21: 6 mg via SUBCUTANEOUS
  Filled 2015-11-21: qty 0.6

## 2015-11-21 MED ORDER — SODIUM CHLORIDE 0.9 % IV SOLN
600.0000 mg/m2 | Freq: Once | INTRAVENOUS | Status: AC
  Administered 2015-11-21: 1500 mg via INTRAVENOUS
  Filled 2015-11-21: qty 75

## 2015-11-21 MED ORDER — CYCLOBENZAPRINE HCL 5 MG PO TABS: 5.0000 mg | ORAL_TABLET | Freq: Three times a day (TID) | ORAL | Status: DC | PRN

## 2015-11-21 NOTE — Patient Instructions (Signed)
[facility] Discharge Instructions for Patients Receiving Chemotherapy  Today you received the following chemotherapy agents TAXOTERE,CYTOXAN To help prevent nausea and vomiting after your treatment, we encourage you to take your nausea medicationas prescribed. If you develop nausea and vomiting that is not controlled by your nausea medication, call the clinic.   BELOW ARE SYMPTOMS THAT SHOULD BE REPORTED IMMEDIATELY:  *FEVER GREATER THAN 100.5 F  *CHILLS WITH OR WITHOUT FEVER  NAUSEA AND VOMITING THAT IS NOT CONTROLLED WITH YOUR NAUSEA MEDICATION  *UNUSUAL SHORTNESS OF BREATH  *UNUSUAL BRUISING OR BLEEDING  TENDERNESS IN MOUTH AND THROAT WITH OR WITHOUT PRESENCE OF ULCERS  *URINARY PROBLEMS  *BOWEL PROBLEMS  UNUSUAL RASH Items with * indicate a potential emergency and should be followed up as soon as possible.  Feel free to call the clinic you have any questions or concerns. The clinic phone number is [phone].  Please show the [facility] at check-in to the Emergency Department and triage nurse.  You also received the ONPRO Neulasta -a growth factor for your white blood cells.

## 2015-11-21 NOTE — Progress Notes (Signed)
[place]  [phone] [phone]     ID: MAIGEN MOZINGO DOB: 1961/03/01  MR#: [id number]  IEP#:[id number]  Patient Care Team: Hoyt Koch, MD as PCP - General (Internal Medicine) Autumn Messing III, MD as Consulting Physician (General Surgery) Chauncey Cruel, MD as Consulting Physician (Oncology) Gery Pray, MD as Consulting Physician (Radiation Oncology) Mauro Kaufmann, RN as Registered Nurse Rockwell Germany, RN as Registered Nurse PCP: Hoyt Koch, MD GYN: OTHER MD:  CHIEF COMPLAINT: Locally advanced estrogen receptor positive breast cancer; bilateral breast cancer  CURRENT TREATMENT: Adjuvant chemotherapy   BREAST CANCER HISTORY: From the original intake note:  Soma's primary care physician retired sometime ago and her health maintenance has not been up-to-date. She has been receiving medical care through the emergency room and was seen in March 2015 following a head injury, then in August 2015 for lancing of an abscess. In December 2015 she noticed that her left breast looked a little bit smaller than her right. She brought this to the attention of friends and family over the next several months but the general feeling was that most people are not perfectly symmetrical. By the summer of this year she noticed that her nipple on the left was "going in". More recently she started developing pain in the left breast. She was evaluated for this in the emergency room 04/09/2015 at which time a left breast exam showed a deformed left breast with a large hard mass encompassing most of the breast, with nipple retraction. There was no nipple discharge or bleeding and no palpable adenopathy.  The patient was referred to [facility] and on 04/13/2015 she underwent bilateral diagnostic mammography with tomosynthesis as well as bilateral breast ultrasound. The breast density was category B. In the right breast at the 11:00 position there was a 1.3  cm irregular mass which by ultrasonography measured 1.3 cm.--In the left breast there was a 4 cm irregular mass at the 2:00 position associated with nipple retraction. There was a second, 1.7 cm area of architectural distortion at the 9:00 position. Both were palpable. By ultrasonography, the 4 cm irregular mass was noted, with a second mass measuring 2.5 cm by ultrasonography. Both axillae were benign.  On 04/19/2015 the patient underwent right breast upper outer quadrant biopsy, showing ([id number]) and invasive ductal carcinoma, grade 1, estrogen receptor 90% positive, progesterone receptor negative, with an MIB-1 of 5%, and no HER-2 amplification, the signals ratio being 1.26 and the number per cell 2.20.  On the same day, she underwent biopsy of the 2 left breast masses in question as well as a suspicious left axillary lymph node. All 3 biopsies showed invasive ductal carcinoma, grade 2, estrogen receptor 80-100% positive, progesterone receptor 80-100% positive, with the MIB-1 between 5 and 10%, and no HER-2 amplification, the signals ratio being between 1.05 and 1.13, and the number per cell between 2.10 and 2.25.  The patient's subsequent history is as detailed below  INTERVAL HISTORY: Takara returns today for follow-up of her estrogen receptor positivel breast cancers. Today is day 1, cycle 4 of 4 planned cycles of cyclophosphamide and docetaxel, with neulasta given on day 2 for granulocyte support.   REVIEW OF SYSTEMS: Elianne has done remarkably well with his chemotherapy. She never had any problems with nausea. She does get diarrhea, usually days 4 and 5, and she takes Imodium for that. She had some mouth sores but they stopped as soon as she started Valtrex. She still has  some chest wall pain from her surgeries. This is likely to remain chronic.She takes naproxen for that. She has gained quite a bit of weight. She also has developed some left upper extremity lymphedema. She is going to  physical therapy regularly and they are thinking of intensifying her treatments. She has applied for disability. A detailed review of systems today was otherwise stable  PAST MEDICAL HISTORY: Past Medical History  Diagnosis Date  . Hypertension   . Breast cancer of upper-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) 04/21/2015  . Breast cancer (HCC)   . GERD (gastroesophageal reflux disease)   . Bilateral breast cancer (HCC)     PAST SURGICAL HISTORY: Past Surgical History  Procedure Laterality Date  . Abdominal hysterectomy  1990  . Tubal ligation  1984  . Mastectomy w/ sentinel node biopsy Right   . Mastectomy modified radical Left 05/09/2015  . Mastectomy w/ sentinel node biopsy Right 05/09/2015    Procedure: RIGHT MASTECTOMY WITH RIGHT AXILLARY SENTINEL LYMPH NODE BIOPSY;  Surgeon: Chevis Pretty III, MD;  Location: [facility] OR;  Service: General;  Laterality: Right;  . Mastectomy modified radical Left 05/09/2015    Procedure: LEFT MODIFIED RADICAL MASTETCTOMY;  Surgeon: Chevis Pretty III, MD;  Location: [facility] OR;  Service: General;  Laterality: Left;  . Portacath placement Right 09/01/2015    Procedure: INSERTION PORT-A-CATH;  Surgeon: Chevis Pretty III, MD;  Location: [facility];  Service: General;  Laterality: Right;    FAMILY HISTORY Family History  Problem Relation Age of Onset  . Hypertension Mother   . Diabetes Mother     the patient has little information on her father. Her mother is living, currently age 60. She has one brother, 2 sisters. There is no history of breast or ovarian cancer in the family to her knowledge.   GYNECOLOGIC HISTORY:  No LMP recorded. Patient has had a hysterectomy.  menarche age 53, first live birth age 29. She is GX P4. She underwent a total abdominal hysterectomy with bilateral salpingo-oophorectomy at age 26. She did not take hormone replacement. She never used oral contraceptives.   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 She is not employed by [employer] but by a Micro[employer]. She is single and lives with her significant other Venetia Maxon. He is status post renal transplant. The patient's daughter  Eloisa Northern works as a Water engineer, as does her daughter  Bufford Lope. Daughter Charise Killian works at [employer], as a call. All 3 live in [place]. The patient's son Derryl Harbor Winstead's lives in [place]. He prepares sets for shows The patient has 4 grandchildren, no great grandchildren. She attends a [employer] in [place].     ADVANCED DIRECTIVES: Not in place. At the initial clinic visit the patient was given the appropriate forms to complete and notarize at her discretion. The patient intends to name her daughter Manuela Neptune as healthcare power of attorney. She can be reached at [phone].  HEALTH MAINTENANCE: Social History  Substance Use Topics  . Smoking status: Former Smoker -- 0.10 packs/day for 28 years  . Smokeless tobacco: Never Used  . Alcohol Use: No     Colonoscopy: Never  PAP: Status post hysterectomy  Bone density: Never  Lipid panel:  Allergies  Allergen Reactions  . Latex Itching and Other (See Comments)    burning  . 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 Wheat Bran Hives    Current Outpatient Prescriptions  Medication Sig Dispense Refill  . acetaminophen (TYLENOL) 500 MG tablet Take 1,000 mg by mouth every 6 (six) hours as needed for mild pain. Reported on 10/31/2015    . cyclobenzaprine (FLEXERIL) 5 MG tablet Take 1 tablet (5 mg total) by mouth 3 (three) times daily as needed for muscle spasms. 30 tablet 0  . dexamethasone (DECADRON) 4 MG tablet Take 2 tablets (8 mg total) by mouth 2 (two) times daily. Start the day before Taxotere. Then again the day after chemo for 3 days. (Patient not taking: Reported on 10/17/2015) 30 tablet 1  . fluconazole (DIFLUCAN) 100 MG tablet Take 1 tablet  (100 mg total) by mouth daily. 30 tablet 1  . gabapentin (NEURONTIN) 300 MG capsule Take 1 capsule (300 mg total) by mouth 3 (three) times daily. 90 capsule 2  . hydrochlorothiazide (HYDRODIURIL) 12.5 MG tablet Take 1 tablet (12.5 mg total) by mouth daily. 30 tablet 3  . ibuprofen (ADVIL,MOTRIN) 200 MG tablet Take 800-1,000 mg by mouth 3 (three) times daily as needed for headache. Reported on 11/07/2015    . lidocaine-prilocaine (EMLA) cream Apply to affected area once 30 g 3  . LORazepam (ATIVAN) 0.5 MG tablet Take 1 tablet (0.5 mg total) by mouth at bedtime as needed (Nausea or vomiting). 30 tablet 0  . losartan (COZAAR) 50 MG tablet Take 1 tablet (50 mg total) by mouth daily. 30 tablet 1  . naproxen (NAPROSYN) 500 MG tablet Take 1 tablet (500 mg total) by mouth 2 (two) times daily with a meal. (Patient not taking: Reported on 11/07/2015) 60 tablet 1  . nystatin (MYCOSTATIN) 100000 UNIT/ML suspension Take 5 mLs (500,000 Units total) by mouth 4 (four) times daily. 240 mL 0  . ondansetron (ZOFRAN) 8 MG tablet Take 1 tablet (8 mg total) by mouth 2 (two) times daily. Start the day after chemo for 3 days. Then take as needed for nausea or vomiting. (Patient not taking: Reported on 10/10/2015) 30 tablet 1  . oxyCODONE-acetaminophen (ROXICET) 5-325 MG tablet Take 1-2 tablets by mouth every 4 (four) hours as needed. (Patient not taking: Reported on 09/15/2015) 30 tablet 0  . pantoprazole (PROTONIX) 40 MG tablet Take 1 tablet (40 mg total) by mouth daily. 30 tablet 1  . prochlorperazine (COMPAZINE) 10 MG tablet Take 1 tablet (10 mg total) by mouth every 6 (six) hours as needed (Nausea or vomiting). (Patient not taking: Reported on 10/10/2015) 30 tablet 1  . valACYclovir (VALTREX) 500 MG tablet Take 1 tablet (500 mg total) by mouth daily. 30 tablet 1  . venlafaxine XR (EFFEXOR XR) 75 MG 24 hr capsule Take 1 capsule (75 mg total) by mouth daily with breakfast. 90 capsule 2   No current facility-administered medications  for this visit.    OBJECTIVE: Middle-aged African-American woman  Filed Vitals:   11/21/15 1327  BP: 168/93  Pulse: 107  Temp: 98.1 F (36.7 C)  Resp: 20     Body mass index is 43.84 kg/(m^2).    ECOG FS:1 - Symptomatic but completely ambulatory   Sclerae unicteric, pupils round and equal Oropharynx clear and moist-- no thrush or other lesions No cervical or supraclavicular adenopathy Lungs no rales or rhonchi Heart regular rate and rhythm Abd soft,obese, nontender, positive bowel sounds MSK no focal spinal tenderness, grade 1-2 left upper extremity lymphedema Neuro: nonfocal, well oriented, appropriate affect Breasts: status post bilateral mastectomies; there  is no evidence of chest wall recurrence; both axillae are benign; there is still hyperpigmentation in the left chest wall radiation port area    LAB RESULTS:  CMP     Component Value Date/Time   NA 141 11/21/2015 1315   NA 136 06/29/2015 0923   K 4.0 11/21/2015 1315   K 4.0 06/29/2015 0923   CL 102 06/29/2015 0923   CO2 23 11/21/2015 1315   CO2 24 06/29/2015 0923   GLUCOSE 194* 11/21/2015 1315   GLUCOSE 107* 06/29/2015 0923   BUN 21.0 11/21/2015 1315   BUN 7 06/29/2015 0923   CREATININE 1.1 11/21/2015 1315   CREATININE 0.84 06/29/2015 0923   CALCIUM 9.6 11/21/2015 1315   CALCIUM 9.2 06/29/2015 0923   PROT 7.4 11/21/2015 1315   PROT 7.8 06/29/2015 0923   ALBUMIN 3.4* 11/21/2015 1315   ALBUMIN 3.5 06/29/2015 0923   AST 16 11/21/2015 1315   AST 20 06/29/2015 0923   ALT 28 11/21/2015 1315   ALT 21 06/29/2015 0923   ALKPHOS 92 11/21/2015 1315   ALKPHOS 87 06/29/2015 0923   BILITOT <0.30 11/21/2015 1315   BILITOT 1.0 06/29/2015 0923   GFRNONAA >60 06/29/2015 0923   GFRAA >60 06/29/2015 0923    INo results found for: SPEP, UPEP  Lab Results  Component Value Date   WBC 12.9* 11/21/2015   NEUTROABS 11.3* 11/21/2015   HGB 12.3 11/21/2015   HCT 38.4 11/21/2015   MCV 90.7 11/21/2015   PLT 184 11/21/2015       Chemistry      Component Value Date/Time   NA 141 11/21/2015 1315   NA 136 06/29/2015 0923   K 4.0 11/21/2015 1315   K 4.0 06/29/2015 0923   CL 102 06/29/2015 0923   CO2 23 11/21/2015 1315   CO2 24 06/29/2015 0923   BUN 21.0 11/21/2015 1315   BUN 7 06/29/2015 0923   CREATININE 1.1 11/21/2015 1315   CREATININE 0.84 06/29/2015 0923      Component Value Date/Time   CALCIUM 9.6 11/21/2015 1315   CALCIUM 9.2 06/29/2015 0923   ALKPHOS 92 11/21/2015 1315   ALKPHOS 87 06/29/2015 0923   AST 16 11/21/2015 1315   AST 20 06/29/2015 0923   ALT 28 11/21/2015 1315   ALT 21 06/29/2015 0923   BILITOT <0.30 11/21/2015 1315   BILITOT 1.0 06/29/2015 0923       No results found for: LABCA2  No components found for: STMHD622  No results for input(s): INR in the last 168 hours.  Urinalysis    Component Value Date/Time   COLORURINE YELLOW 06/29/2015 1030   APPEARANCEUR CLOUDY* 06/29/2015 1030   LABSPEC 1.019 06/29/2015 1030   PHURINE 7.0 06/29/2015 1030   GLUCOSEU NEGATIVE 06/29/2015 1030   HGBUR TRACE* 06/29/2015 1030   BILIRUBINUR NEGATIVE 06/29/2015 1030   KETONESUR NEGATIVE 06/29/2015 1030   PROTEINUR NEGATIVE 06/29/2015 1030   NITRITE NEGATIVE 06/29/2015 1030   LEUKOCYTESUR MODERATE* 06/29/2015 1030    STUDIES: No results found.   ASSESSMENT: 55 y.o. [place] woman with synchronous breast cancers, as follows  (a) status post right breast upper outer quadrant biopsy 04/19/2015 for a clinical pT1c N0, stage IA invasive ductal carcinoma, grade 1, estrogen receptor positive, progesterone receptor negative, with an MIB-1 of 5%, and no HER-2 amplification  (b) status post left breast biopsy 2 and left axillary lymph node biopsy 04/19/2015 for a clinical T3 N1, stage IIIA invasive ductal carcinoma, grade 1 or 2, estrogen receptor and progesterone receptor  positive, HER-2 negative, with an MIB-1 between 5 and 10%  (1) status post bilateral mastectomies 05/09/2015,  showing:   (a) on the right side, a pT2 pN0, stage IIA invasive ductal carcinoma, with negative margins and repeat HER-2 again negative    (b) on the left, a pT3 pN2, stage IIIA invasive ductal carcinoma, grade 2, with negative margins, and repeat HER-2 again negative  (2) Mammaprint from the left-sided tumor returned "luminal type, low risk", predicting a small benefit from chemotherapy with the important caveat that N2 disease was not included in the MINDACT study--patient opted for chemotherapy  (3) Oncotype from the right-sided tumor showed a score of 12, predicting an 8% risk of recurrence outside the breast within the next 10 years, if the patient's only systemic therapy was tamoxifen for 5 years. It also predicted no significant benefit from chemotherapy  (4) postmastectomy radiation to the left chest wall completed 08/14/2014  (5) adjuvant chemotherapy consisting of cyclophosphamide and docetaxel x4 starting 09/19/15  (5) 10 years of antiestrogen therapy to follow with consideration of the PALLAS trial  (6) left upper extremity lymphedema  (7) to consider Alliance 11401, PALLAS and the aspirin study  PLAN: Annasofia will complete her chemotherapy today. She has done remarkably well with that. Once she recovers from this, we will start anastrozole.  She is getting significant benefit from rehabilitation in terms of her left upper extremity lymphedemaand they are in process of getting a sleeve and pump for her.  She has gained a considerable amount of weight from the time of her diagnosis (between 30 and 35 pounds.) We will start working on this as soon as she recovers a little bit from her chemotherapy but in addition she may qualify to our weight intervention study, Alliance 11401.  I think she would also be a good candidate for the aspirin study and eventually for the checkpoint inhibitor study.  Shanesha has a wonderfully positive attitude. She is mentoring church members already and I  think she would be a terrific addition to the [employer] group.I gave her information on that at this point.  She is going to see [place] again in a week just to make sure her counts are okay and then she will see me again late May. She knows to call for any problems that may develop before that visit.   Chauncey Cruel, MD   11/21/2015 1:56 PM

## 2015-11-21 NOTE — Telephone Encounter (Signed)
appt made and avs will print in treatment room °

## 2015-11-22 ENCOUNTER — Telehealth: Payer: Self-pay | Admitting: *Deleted

## 2015-11-22 DIAGNOSIS — C50412 Malignant neoplasm of upper-outer quadrant of left female breast: Secondary | ICD-10-CM

## 2015-11-22 NOTE — Telephone Encounter (Signed)
Called pt to congratulate on completion of chemo on 11/21/15. Discussed survivorship program and referral. Confirmed future appts. Denies needs at this time.

## 2015-11-28 ENCOUNTER — Encounter: Payer: Self-pay | Admitting: Nurse Practitioner

## 2015-11-28 ENCOUNTER — Encounter: Payer: Self-pay | Admitting: Internal Medicine

## 2015-11-28 ENCOUNTER — Encounter: Payer: Self-pay | Admitting: Oncology

## 2015-11-28 ENCOUNTER — Telehealth: Payer: Self-pay | Admitting: Nurse Practitioner

## 2015-11-28 ENCOUNTER — Other Ambulatory Visit (HOSPITAL_BASED_OUTPATIENT_CLINIC_OR_DEPARTMENT_OTHER): Payer: BLUE CROSS/BLUE SHIELD

## 2015-11-28 ENCOUNTER — Ambulatory Visit (HOSPITAL_BASED_OUTPATIENT_CLINIC_OR_DEPARTMENT_OTHER): Payer: BLUE CROSS/BLUE SHIELD | Admitting: Nurse Practitioner

## 2015-11-28 ENCOUNTER — Telehealth: Payer: Self-pay | Admitting: *Deleted

## 2015-11-28 ENCOUNTER — Ambulatory Visit (INDEPENDENT_AMBULATORY_CARE_PROVIDER_SITE_OTHER): Payer: BLUE CROSS/BLUE SHIELD | Admitting: Internal Medicine

## 2015-11-28 VITALS — BP 157/92 | HR 104 | Temp 98.1°F | Resp 18 | Ht 69.0 in | Wt 289.5 lb

## 2015-11-28 DIAGNOSIS — R0789 Other chest pain: Secondary | ICD-10-CM | POA: Diagnosis not present

## 2015-11-28 DIAGNOSIS — C50911 Malignant neoplasm of unspecified site of right female breast: Secondary | ICD-10-CM | POA: Diagnosis not present

## 2015-11-28 DIAGNOSIS — C50411 Malignant neoplasm of upper-outer quadrant of right female breast: Secondary | ICD-10-CM

## 2015-11-28 DIAGNOSIS — C773 Secondary and unspecified malignant neoplasm of axilla and upper limb lymph nodes: Secondary | ICD-10-CM

## 2015-11-28 DIAGNOSIS — R5383 Other fatigue: Secondary | ICD-10-CM

## 2015-11-28 DIAGNOSIS — I890 Lymphedema, not elsewhere classified: Secondary | ICD-10-CM

## 2015-11-28 DIAGNOSIS — I10 Essential (primary) hypertension: Secondary | ICD-10-CM | POA: Diagnosis not present

## 2015-11-28 DIAGNOSIS — C50412 Malignant neoplasm of upper-outer quadrant of left female breast: Secondary | ICD-10-CM

## 2015-11-28 DIAGNOSIS — C50912 Malignant neoplasm of unspecified site of left female breast: Secondary | ICD-10-CM | POA: Diagnosis not present

## 2015-11-28 DIAGNOSIS — C50211 Malignant neoplasm of upper-inner quadrant of right female breast: Secondary | ICD-10-CM

## 2015-11-28 LAB — CBC WITH DIFFERENTIAL/PLATELET
BASO%: 0.3 % (ref 0.0–2.0)
Basophils Absolute: 0 10*3/uL (ref 0.0–0.1)
EOS%: 0.5 % (ref 0.0–7.0)
Eosinophils Absolute: 0 10*3/uL (ref 0.0–0.5)
HCT: 38.7 % (ref 34.8–46.6)
HGB: 12.6 g/dL (ref 11.6–15.9)
LYMPH%: 23.5 % (ref 14.0–49.7)
MCH: 29.9 pg (ref 25.1–34.0)
MCHC: 32.6 g/dL (ref 31.5–36.0)
MCV: 91.7 fL (ref 79.5–101.0)
MONO#: 1 10*3/uL — ABNORMAL HIGH (ref 0.1–0.9)
MONO%: 12.7 % (ref 0.0–14.0)
NEUT#: 4.9 10*3/uL (ref 1.5–6.5)
NEUT%: 63 % (ref 38.4–76.8)
Platelets: 213 10*3/uL (ref 145–400)
RBC: 4.22 10*6/uL (ref 3.70–5.45)
RDW: 17.4 % — ABNORMAL HIGH (ref 11.2–14.5)
WBC: 7.8 10*3/uL (ref 3.9–10.3)
lymph#: 1.8 10*3/uL (ref 0.9–3.3)
nRBC: 1 % — ABNORMAL HIGH (ref 0–0)

## 2015-11-28 LAB — COMPREHENSIVE METABOLIC PANEL
ALT: 20 U/L (ref 0–55)
AST: 13 U/L (ref 5–34)
Albumin: 3.4 g/dL — ABNORMAL LOW (ref 3.5–5.0)
Alkaline Phosphatase: 118 U/L (ref 40–150)
Anion Gap: 9 mEq/L (ref 3–11)
BUN: 19.4 mg/dL (ref 7.0–26.0)
CO2: 26 mEq/L (ref 22–29)
Calcium: 8.7 mg/dL (ref 8.4–10.4)
Chloride: 106 mEq/L (ref 98–109)
Creatinine: 0.9 mg/dL (ref 0.6–1.1)
EGFR: 87 mL/min/{1.73_m2} — ABNORMAL LOW (ref >90)
Glucose: 112 mg/dl (ref 70–140)
Potassium: 3.4 mEq/L — ABNORMAL LOW (ref 3.5–5.1)
Sodium: 141 mEq/L (ref 136–145)
Total Bilirubin: 0.31 mg/dL (ref 0.20–1.20)
Total Protein: 6.8 g/dL (ref 6.4–8.3)

## 2015-11-28 MED ORDER — OXYCODONE-ACETAMINOPHEN 5-325 MG PO TABS: 1.0000 | ORAL_TABLET | Freq: Three times a day (TID) | ORAL | Status: DC | PRN

## 2015-11-28 NOTE — Progress Notes (Signed)
Pre visit review using our clinic review tool, if applicable. No additional management support is needed unless otherwise documented below in the visit note. 

## 2015-11-28 NOTE — Telephone Encounter (Signed)
Call received from [facility] with [facility] calling to confirm receipt of facsimile sent to [phone].  Call transferred to [facility] Ext [phone]./

## 2015-11-28 NOTE — Assessment & Plan Note (Signed)
Rx for oxycodone for temporary relief of her chest wall pain. Advised to ice the area several times per day.

## 2015-11-28 NOTE — Assessment & Plan Note (Signed)
Will need lipid panel and HgA1c, have asked her oncologist to draw these with next blood draw. She is not able to exercise right now due to her recovery from her cancer.

## 2015-11-28 NOTE — Telephone Encounter (Signed)
Contacted Dr Marlou Starks office to sch port removal. Scheduler I spoke with is in training and she will call me back to give appt date/time

## 2015-11-28 NOTE — Assessment & Plan Note (Signed)
BP at goal on HCTZ 12.5 mg daily and losartan 50 mg daily. Recent BMP without indication for change.

## 2015-11-28 NOTE — Assessment & Plan Note (Signed)
Continues to follow with oncology and s/p bilateral mastectomy and chemotherapy. Since she had estrogen sensitive will likely be on hormonal therapy for several years.

## 2015-11-28 NOTE — Patient Instructions (Signed)
We have given you the oxycodone for the pain, make sure to ice the area for 10 minutes several times today.  When you are feeling better 3-6 months from now think about getting the colon cancer screening.   We would like to see you back in about 6 months to check on you and your blood pressure.   As you are able when you are feeling better work on being active again.   It was good to meet you today.

## 2015-11-28 NOTE — Progress Notes (Signed)
Margaret [name]  [phone] [phone]   ID: VIANN NIELSON DOB: May 12, 1961  MR#: [id number]  ZDG#:[id number]  Patient Care Team: Hoyt Koch, MD as PCP - General (Internal Medicine) Autumn Messing III, MD as Consulting Physician (General Surgery) Chauncey Cruel, MD as Consulting Physician (Oncology) Gery Pray, MD as Consulting Physician (Radiation Oncology) Mauro Kaufmann, RN as Registered Nurse Rockwell Germany, RN as Registered Nurse PCP: Hoyt Koch, MD GYN: OTHER MD:  CHIEF COMPLAINT: Locally advanced estrogen receptor positive breast cancer; bilateral breast cancer  CURRENT TREATMENT: Adjuvant chemotherapy  BREAST CANCER HISTORY: From the original intake note:  Anastaisa's primary care physician retired sometime ago and her health maintenance has not been up-to-date. She has been receiving medical care through the emergency room and was seen in March 2015 following a head injury, then in August 2015 for lancing of an abscess. In December 2015 she noticed that her left breast looked a little bit smaller than her right. She brought this to the attention of friends and family over the next several months but the general feeling was that most people are not perfectly symmetrical. By the summer of this year she noticed that her nipple on the left was "going in". More recently she started developing pain in the left breast. She was evaluated for this in the emergency room 04/09/2015 at which time a left breast exam showed a deformed left breast with a large hard mass encompassing most of the breast, with nipple retraction. There was no nipple discharge or bleeding and no palpable adenopathy.  The patient was referred to [facility] and on 04/13/2015 she underwent bilateral diagnostic mammography with tomosynthesis as well as bilateral breast ultrasound. The breast density was category B. In the right breast at the 11:00 position there was a 1.3 cm  irregular mass which by ultrasonography measured 1.3 cm.--In the left breast there was a 4 cm irregular mass at the 2:00 position associated with nipple retraction. There was a second, 1.7 cm area of architectural distortion at the 9:00 position. Both were palpable. By ultrasonography, the 4 cm irregular mass was noted, with a second mass measuring 2.5 cm by ultrasonography. Both axillae were benign.  On 04/19/2015 the patient underwent right breast upper outer quadrant biopsy, showing ([id number]) and invasive ductal carcinoma, grade 1, estrogen receptor 90% positive, progesterone receptor negative, with an MIB-1 of 5%, and no HER-2 amplification, the signals ratio being 1.26 and the number per cell 2.20.  On the same day, she underwent biopsy of the 2 left breast masses in question as well as a suspicious left axillary lymph node. All 3 biopsies showed invasive ductal carcinoma, grade 2, estrogen receptor 80-100% positive, progesterone receptor 80-100% positive, with the MIB-1 between 5 and 10%, and no HER-2 amplification, the signals ratio being between 1.05 and 1.13, and the number per cell between 2.10 and 2.25.  The patient's subsequent history is as detailed below  INTERVAL HISTORY: Avielle returns today for follow-up of her estrogen receptor positivel breast cancers. Today is day 55, cycle 4 of 4 planned cycles of cyclophosphamide and docetaxel, with neulasta given on day 2 for granulocyte support.   REVIEW OF SYSTEMS: Lilyth's main complaint today is fatigue. She feels dehydrated as her water intake has been down. She denies fevers or chills. She had nausea for the first time but the zofran handled it well. She starting having diarrhea last night and is on imodium. This is back under control. She  is taking valtrex for some mouth sores. She denies rashes. She has no neuropathy symptoms. She continues on naproxen PRN for her chest wall pain. She is working with physical therapy for her left upper  extremity lymphedema. They plan to send her out a compression pump. A detailed review of systems is otherwise stable.  PAST MEDICAL HISTORY: Past Medical History  Diagnosis Date  . Hypertension   . Breast cancer of upper-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) 04/21/2015  . Breast cancer (HCC)   . GERD (gastroesophageal reflux disease)   . Bilateral breast cancer (HCC)     PAST SURGICAL HISTORY: Past Surgical History  Procedure Laterality Date  . Abdominal hysterectomy  1990  . Tubal ligation  1984  . Mastectomy w/ sentinel node biopsy Right   . Mastectomy modified radical Left 05/09/2015  . Mastectomy w/ sentinel node biopsy Right 05/09/2015    Procedure: RIGHT MASTECTOMY WITH RIGHT AXILLARY SENTINEL LYMPH NODE BIOPSY;  Surgeon: Chevis Pretty III, MD;  Location: [facility] OR;  Service: General;  Laterality: Right;  . Mastectomy modified radical Left 05/09/2015    Procedure: LEFT MODIFIED RADICAL MASTETCTOMY;  Surgeon: Chevis Pretty III, MD;  Location: [facility] OR;  Service: General;  Laterality: Left;  . Portacath placement Right 09/01/2015    Procedure: INSERTION PORT-A-CATH;  Surgeon: Chevis Pretty III, MD;  Location: [facility];  Service: General;  Laterality: Right;    FAMILY HISTORY Family History  Problem Relation Age of Onset  . Hypertension Mother   . Diabetes Mother     the patient has little information on her father. Her mother is living, currently age 55. She has one brother, 2 sisters. There is no history of breast or ovarian cancer in the family to her knowledge.   GYNECOLOGIC HISTORY:  No LMP recorded. Patient has had a hysterectomy.  menarche age 33, first live birth age 55. She is GX P4. She underwent a total abdominal hysterectomy with bilateral salpingo-oophorectomy at age 55. She did not take hormone replacement. She never used oral contraceptives.   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 She is not employed by  [employer] but by a Micro[employer]. She is single and lives with her significant other Venetia Maxon. He is status post renal transplant. The patient's daughter  Eloisa Northern works as a Water engineer, as does her daughter  Bufford Lope. Daughter Charise Killian works at [employer], as a call. All 3 live in [place]. The patient's son Derryl Harbor Rinke's lives in [place]. He prepares sets for shows The patient has 4 grandchildren, no great grandchildren. She attends a [employer] in [place].     ADVANCED DIRECTIVES: Not in place. At the initial clinic visit the patient was given the appropriate forms to complete and notarize at her discretion. The patient intends to name her daughter Manuela Neptune as healthcare power of attorney. She can be reached at [phone].  HEALTH MAINTENANCE: Social History  Substance Use Topics  . Smoking status: Former Smoker -- 0.10 packs/day for 28 years  . Smokeless tobacco: Never Used  . Alcohol Use: No     Colonoscopy: Never  PAP: Status post hysterectomy  Bone density: Never  Lipid panel:  Allergies  Allergen Reactions  . Latex Itching and Other (See Comments)    burning  . 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 Wheat  Bran Hives    Current Outpatient Prescriptions  Medication Sig Dispense Refill  . gabapentin (NEURONTIN) 300 MG capsule Take 1 capsule (300 mg total) by mouth 3 (three) times daily. 90 capsule 2  . hydrochlorothiazide (HYDRODIURIL) 12.5 MG tablet Take 1 tablet (12.5 mg total) by mouth daily. 30 tablet 3  . losartan (COZAAR) 50 MG tablet Take 1 tablet (50 mg total) by mouth daily. 30 tablet 1  . oxyCODONE-acetaminophen (ROXICET) 5-325 MG tablet Take 1 tablet by mouth every 8 (eight) hours as needed for severe pain. 20 tablet 0  . pantoprazole (PROTONIX) 40 MG tablet Take 1 tablet (40 mg total) by mouth daily. 30 tablet 1  . valACYclovir (VALTREX) 500 MG tablet Take 1 tablet (500 mg  total) by mouth daily. 30 tablet 1  . venlafaxine XR (EFFEXOR XR) 75 MG 24 hr capsule Take 1 capsule (75 mg total) by mouth daily with breakfast. 90 capsule 2  . acetaminophen (TYLENOL) 500 MG tablet Take 1,000 mg by mouth every 6 (six) hours as needed for mild pain. Reported on 11/28/2015    . cyclobenzaprine (FLEXERIL) 5 MG tablet Take 1 tablet (5 mg total) by mouth 3 (three) times daily as needed for muscle spasms. (Patient not taking: Reported on 11/28/2015) 30 tablet 0  . lidocaine-prilocaine (EMLA) cream Apply to affected area once (Patient not taking: Reported on 11/28/2015) 30 g 3  . naproxen (NAPROSYN) 500 MG tablet Take 1 tablet (500 mg total) by mouth 2 (two) times daily with a meal. (Patient not taking: Reported on 11/28/2015) 60 tablet 1   No current facility-administered medications for this visit.    OBJECTIVE: Middle-aged African-American woman  Filed Vitals:   11/28/15 1102  BP: 157/92  Pulse: 104  Temp: 98.1 F (36.7 C)  Resp: 18     Body mass index is 42.73 kg/(m^2).    ECOG FS:1 - Symptomatic but completely ambulatory   Skin: warm, dry  HEENT: sclerae anicteric, conjunctivae pink, oropharynx clear. No thrush or mucositis.  Lymph Nodes: No cervical or supraclavicular lymphadenopathy  Lungs: clear to auscultation bilaterally, no rales, wheezes, or rhonci  Heart: regular rate and rhythm  Abdomen: round, soft, non tender, positive bowel sounds  Musculoskeletal: No focal spinal tenderness, grade 1 left upper extremity lymphedema Neuro: non focal, well oriented, positive affect  Breasts:deferred  LAB RESULTS:  CMP     Component Value Date/Time   NA 141 11/28/2015 1030   NA 136 06/29/2015 0923   K 3.4* 11/28/2015 1030   K 4.0 06/29/2015 0923   CL 102 06/29/2015 0923   CO2 26 11/28/2015 1030   CO2 24 06/29/2015 0923   GLUCOSE 112 11/28/2015 1030   GLUCOSE 107* 06/29/2015 0923   BUN 19.4 11/28/2015 1030   BUN 7 06/29/2015 0923   CREATININE 0.9 11/28/2015 1030    CREATININE 0.84 06/29/2015 0923   CALCIUM 8.7 11/28/2015 1030   CALCIUM 9.2 06/29/2015 0923   PROT 6.8 11/28/2015 1030   PROT 7.8 06/29/2015 0923   ALBUMIN 3.4* 11/28/2015 1030   ALBUMIN 3.5 06/29/2015 0923   AST 13 11/28/2015 1030   AST 20 06/29/2015 0923   ALT 20 11/28/2015 1030   ALT 21 06/29/2015 0923   ALKPHOS 118 11/28/2015 1030   ALKPHOS 87 06/29/2015 0923   BILITOT 0.31 11/28/2015 1030   BILITOT 1.0 06/29/2015 0923   GFRNONAA >60 06/29/2015 0923   GFRAA >60 06/29/2015 0923    INo results found for: SPEP, UPEP  Lab Results  Component Value Date   WBC 7.8 11/28/2015   NEUTROABS 4.9 11/28/2015   HGB 12.6 11/28/2015   HCT 38.7 11/28/2015   MCV 91.7 11/28/2015   PLT 213 11/28/2015      Chemistry      Component Value Date/Time   NA 141 11/28/2015 1030   NA 136 06/29/2015 0923   K 3.4* 11/28/2015 1030   K 4.0 06/29/2015 0923   CL 102 06/29/2015 0923   CO2 26 11/28/2015 1030   CO2 24 06/29/2015 0923   BUN 19.4 11/28/2015 1030   BUN 7 06/29/2015 0923   CREATININE 0.9 11/28/2015 1030   CREATININE 0.84 06/29/2015 0923      Component Value Date/Time   CALCIUM 8.7 11/28/2015 1030   CALCIUM 9.2 06/29/2015 0923   ALKPHOS 118 11/28/2015 1030   ALKPHOS 87 06/29/2015 0923   AST 13 11/28/2015 1030   AST 20 06/29/2015 0923   ALT 20 11/28/2015 1030   ALT 21 06/29/2015 0923   BILITOT 0.31 11/28/2015 1030   BILITOT 1.0 06/29/2015 0923       No results found for: LABCA2  No components found for: [id number]  No results for input(s): INR in the last 168 hours.  Urinalysis    Component Value Date/Time   COLORURINE YELLOW 06/29/2015 1030   APPEARANCEUR CLOUDY* 06/29/2015 1030   LABSPEC 1.019 06/29/2015 1030   PHURINE 7.0 06/29/2015 1030   GLUCOSEU NEGATIVE 06/29/2015 1030   HGBUR TRACE* 06/29/2015 1030   BILIRUBINUR NEGATIVE 06/29/2015 1030   KETONESUR NEGATIVE 06/29/2015 1030   PROTEINUR NEGATIVE 06/29/2015 1030   NITRITE NEGATIVE 06/29/2015 1030    LEUKOCYTESUR MODERATE* 06/29/2015 1030    STUDIES: No results found.   ASSESSMENT: 55 y.o. [place] woman with synchronous breast cancers, as follows  (a) status post right breast upper outer quadrant biopsy 04/19/2015 for a clinical pT1c N0, stage IA invasive ductal carcinoma, grade 1, estrogen receptor positive, progesterone receptor negative, with an MIB-1 of 5%, and no HER-2 amplification  (b) status post left breast biopsy 2 and left axillary lymph node biopsy 04/19/2015 for a clinical T3 N1, stage IIIA invasive ductal carcinoma, grade 1 or 2, estrogen receptor and progesterone receptor positive, HER-2 negative, with an MIB-1 between 5 and 10%  (1) status post bilateral mastectomies 05/09/2015, showing:   (a) on the right side, a pT2 pN0, stage IIA invasive ductal carcinoma, with negative margins and repeat HER-2 again negative    (b) on the left, a pT3 pN2, stage IIIA invasive ductal carcinoma, grade 2, with negative margins, and repeat HER-2 again negative  (2) Mammaprint from the left-sided tumor returned "luminal type, low risk", predicting a small benefit from chemotherapy with the important caveat that N2 disease was not included in the MINDACT study--patient opted for chemotherapy  (3) Oncotype from the right-sided tumor showed a score of 12, predicting an 8% risk of recurrence outside the breast within the next 10 years, if the patient's only systemic therapy was tamoxifen for 5 years. It also predicted no significant benefit from chemotherapy  (4) postmastectomy radiation to the left chest wall completed 08/14/2014  (5) adjuvant chemotherapy consisting of cyclophosphamide and docetaxel x4 completed on 11/21/15.  (5) 10 years of antiestrogen therapy to follow with consideration of the PALLAS trial  (6) left upper extremity lymphedema  (7) to consider [facility] 80810, PALLAS and the aspirin study  PLAN: Randie is a little sluggish today, but is excited to be completely  done with chemotherapy. The labs were reviewed  in detail and were stable.   She is going to spend the next few weeks recovering from treatment. When she returns in May for follow up with Dr. Jana Hakim they will discuss antiestrogen therapy. She understands and agrees with this plan. She knows the goal of treatment in her case is cure. She has been encouraged to call with any issues that might arise before her next visit here.   Laurie Panda, NP   11/28/2015 4:27 PM

## 2015-11-28 NOTE — Progress Notes (Signed)
   Subjective:    Patient ID: Margaret [name], female    DOB: [birth year], 55 y.o.   MRN: [id number]  HPI The patient is a new 55 YO female coming in for pain in her chest and diarrhea. She has just completed cancer treatment for bilateral breast cancer. This is giving her some diarrhea and she is using medicines they had provided to her. 2-3 times per day and loose. Worse after eating. No significant weight change. The pain in her chest happened during a christening her grandchild butted her in the chest and this has caused severe pain. 10/10. She took a leftover pill (her last) and it provided temporary relief. Prior to that no pain in her chest. Some lymphedema in her left arm.  PMH, [facility], social history reviewed and updated  Review of Systems  Constitutional: Positive for activity change and appetite change. Negative for fever, chills, fatigue and unexpected weight change.  HENT: Negative.   Eyes: Negative.   Respiratory: Negative for cough, chest tightness, shortness of breath and wheezing.   Cardiovascular: Positive for chest pain. Negative for palpitations and leg swelling.  Gastrointestinal: Positive for diarrhea. Negative for nausea, vomiting, abdominal pain, constipation and abdominal distention.  Musculoskeletal: Positive for arthralgias. Negative for myalgias, back pain, gait problem and neck pain.  Skin: Negative.   Neurological: Negative for dizziness, weakness, light-headedness and headaches.  Psychiatric/Behavioral: Negative.       Objective:   Physical Exam  Constitutional: She is oriented to person, place, and time. She appears well-developed and well-nourished.  Overweight  HENT:  Head: Normocephalic and atraumatic.  Eyes: EOM are normal.  Neck: Normal range of motion.  Cardiovascular: Normal rate and regular rhythm.   Pulmonary/Chest: Effort normal and breath sounds normal. No respiratory distress. She has no wheezes. She has no rales. She exhibits tenderness.    Abdominal: Soft. Bowel sounds are normal. She exhibits no distension. There is no tenderness. There is no rebound.  Musculoskeletal: She exhibits no edema.  Neurological: She is alert and oriented to person, place, and time. Coordination normal.  Skin: Skin is warm and dry.  Psychiatric: She has a normal mood and affect.   Filed Vitals:   11/28/15 0916  BP: 122/84  Pulse: 102  Temp: 98.2 F (36.8 C)  TempSrc: Oral  Resp: 16  Height: 5\' 9"  (1.753 m)  Weight: 289 lb (131.09 kg)  SpO2: 98%      Assessment & Plan:

## 2015-11-28 NOTE — Progress Notes (Signed)
Left form for dr.to sign

## 2015-11-29 ENCOUNTER — Encounter: Payer: Self-pay | Admitting: *Deleted

## 2015-11-29 ENCOUNTER — Ambulatory Visit: Payer: BLUE CROSS/BLUE SHIELD | Admitting: Physical Therapy

## 2015-11-29 NOTE — Progress Notes (Signed)
This RN faxed order to [facility] at [phone] per request for " [phone] Lymphedema Pump, 189.0 with requested documentation.

## 2015-11-30 ENCOUNTER — Encounter: Payer: Self-pay | Admitting: Oncology

## 2015-11-30 NOTE — Progress Notes (Signed)
I faxed [facility] [phone] and sent copy to medical records and copy for patient.

## 2015-12-01 ENCOUNTER — Telehealth: Payer: Self-pay | Admitting: *Deleted

## 2015-12-01 ENCOUNTER — Ambulatory Visit: Payer: BLUE CROSS/BLUE SHIELD | Admitting: Physical Therapy

## 2015-12-01 NOTE — Telephone Encounter (Signed)
Called Margaret [name] to inform her that Dr. Sharlet Salina wants her to have a lipid panel to check her cholesterol. Left a message for Margaret [name] to call her PCP's office to set this up or she can call this nurse to ger further instructions.

## 2015-12-02 ENCOUNTER — Ambulatory Visit: Payer: BLUE CROSS/BLUE SHIELD

## 2015-12-06 ENCOUNTER — Ambulatory Visit: Payer: BLUE CROSS/BLUE SHIELD | Attending: Nurse Practitioner

## 2015-12-06 DIAGNOSIS — I972 Postmastectomy lymphedema syndrome: Secondary | ICD-10-CM | POA: Insufficient documentation

## 2015-12-06 DIAGNOSIS — M25612 Stiffness of left shoulder, not elsewhere classified: Secondary | ICD-10-CM | POA: Insufficient documentation

## 2015-12-06 DIAGNOSIS — R293 Abnormal posture: Secondary | ICD-10-CM | POA: Insufficient documentation

## 2015-12-06 DIAGNOSIS — M25611 Stiffness of right shoulder, not elsewhere classified: Secondary | ICD-10-CM | POA: Insufficient documentation

## 2015-12-07 ENCOUNTER — Ambulatory Visit: Payer: BLUE CROSS/BLUE SHIELD | Admitting: Physical Therapy

## 2015-12-13 ENCOUNTER — Ambulatory Visit: Payer: BLUE CROSS/BLUE SHIELD

## 2015-12-14 ENCOUNTER — Ambulatory Visit: Payer: BLUE CROSS/BLUE SHIELD

## 2015-12-14 DIAGNOSIS — M25612 Stiffness of left shoulder, not elsewhere classified: Secondary | ICD-10-CM

## 2015-12-14 DIAGNOSIS — R293 Abnormal posture: Secondary | ICD-10-CM

## 2015-12-14 DIAGNOSIS — I972 Postmastectomy lymphedema syndrome: Secondary | ICD-10-CM | POA: Diagnosis present

## 2015-12-14 DIAGNOSIS — M25611 Stiffness of right shoulder, not elsewhere classified: Secondary | ICD-10-CM

## 2015-12-14 NOTE — Therapy (Signed)
[place], [place], [address] Phone: [phone]   Fax:  [phone]  Physical Therapy Treatment  Patient Details  Name: Margaret [name] MRN: [id number] Date of Birth: [birth year] Referring Provider: Gentry Fitz   Encounter Date: 12/14/2015      PT End of Session - 12/14/15 1112    Visit Number 7   Number of Visits 17   Date for PT Re-Evaluation 12/26/15   PT Start Time 1022   PT Stop Time 1107   PT Time Calculation (min) 45 min   Activity Tolerance Patient tolerated treatment well   Behavior During Therapy [facility] for tasks assessed/performed      Past Medical History  Diagnosis Date  . Hypertension   . Breast cancer of upper-outer quadrant of left female breast ([place]) 04/21/2015  . Breast cancer ([place])   . GERD (gastroesophageal reflux disease)   . Bilateral breast cancer [facility])     Past Surgical History  Procedure Laterality Date  . Abdominal hysterectomy  1990  . Tubal ligation  1984  . Mastectomy w/ sentinel node biopsy Right   . Mastectomy modified radical Left 05/09/2015  . Mastectomy w/ sentinel node biopsy Right 05/09/2015    Procedure: RIGHT MASTECTOMY WITH RIGHT AXILLARY SENTINEL LYMPH NODE BIOPSY;  Surgeon: Autumn Messing III, MD;  Location: [place];  Service: General;  Laterality: Right;  . Mastectomy modified radical Left 05/09/2015    Procedure: LEFT MODIFIED RADICAL MASTETCTOMY;  Surgeon: Autumn Messing III, MD;  Location: [place];  Service: General;  Laterality: Left;  . Portacath placement Right 09/01/2015    Procedure: INSERTION PORT-A-CATH;  Surgeon: Autumn Messing III, MD;  Location: [place];  Service: General;  Laterality: Right;    There were no vitals filed for this visit.      Subjective Assessment - 12/14/15 1031    Subjective I've been having car trouble and was sick so that's why I've missed lately. I can commit to Thursday for now though and I've been wrapping  myself some at home and working on my ROM and feel like that's better now.    Pertinent History bilateral mastectomy in October 2016 9 nodes removed from left side and 5 from the right side.   She has completed radition is now taking chemotherapy. She has it on Mondays every three weeks and has 2 more.  She has just started having tingling in hands and feet.  Otherwise she is doing OK    Patient Stated Goals pt wants to be able to wash her back, will be able to open jars and will have the swelling down    Currently in Pain? No/denies            [facility] PT Assessment - 12/14/15 0001    AROM   Right Shoulder Flexion 150 Degrees   Right Shoulder ABduction 152 Degrees   Left Shoulder Flexion 143 Degrees   Left Shoulder ABduction 139 Degrees           LYMPHEDEMA/ONCOLOGY QUESTIONNAIRE - 12/14/15 1033    Left Upper Extremity Lymphedema   15 cm Proximal to Olecranon Process 45.7 cm   10 cm Proximal to Olecranon Process 42.8 cm   Olecranon Process 34.2 cm   15 cm Proximal to Ulnar Styloid Process 32.4 cm   10 cm Proximal to Ulnar Styloid Process 29 cm   Just Proximal to Ulnar Styloid Process 22.9 cm   Across Hand at [employer] 22.1  cm   At [facility] of 2nd Digit 7.8 cm   Other Pt reports fullness at incisions and around to back have improved some                  [facility] Adult PT Treatment/Exercise - 12/14/15 0001    Manual Therapy   Manual Lymphatic Drainage (MLD) In Supine: Short neck, superficial and deep abdominals, Lt inguinal nodes and Lt axillo-inguinal anastomosis and Lt UE from dorsal hand to lateral shoulder.    Compression Bandaging Biotone applied to left arm then thick stockinette, elastomull to hand and  fingers 2-5, 2 artiflex, 1-6 and 2-10 cm and added 1-12 cm (to get up to axilla and for better UE coverage )short stretch bandages from hand to axilla                     Short Term Clinic Goals - 12/14/15 1213    CC Short Term Goal  #1   Title  Patient with verbalize an understanding of lymphedema risk reduction precautions   Status On-going   CC Short Term Goal  #2   Title Patient will be independent in a basic home exercise program so that she can tolerate the rest of chemo and perform her ADLs better   Status Achieved   CC Short Term Goal  #3   Title Patient will be independent in self manual lymph drainage so she can begin to manage her lymphedema at home    [place] - 12/14/15 1213    [place] Term Goal  #1   Title Patient will improve left and right shoulder abduction to 120 degrees so that she can perform ADLs with greater ease a home    Baseline 152 degrees attained on 12/14/15   Status Achieved   CC Long Term Goal  #2   Title Patient will report mproved ability to do household tasks of cooking and cleaning with greater ease    Status Achieved   CC Long Term Goal  #3   Title Patient will be know how to obtain and use compression garments for maintenance phase of treatment   Status On-going            Plan - 12/14/15 1113    Clinical Impression Statement Pt has missed appointments for about 3 weeks now due to transportation issues and was sick for a period of time but continued to apply compression bandages to her Lt UE and performed self manual lymph drianage intermittently as time allowed her and her circumference measurements were mostly reduced from last time measured (excpet at wrist) due to her continuing with complete decongestive therapy as best she could at home. Her A/ROM is also WNLs now and she has met those goals. Pt is able to come on Thursdays for now so will continue with self treatment at home between visits and look into getting measured for compression garments in next 2 visits. She had the pump demonstration at her home and is awaiting hearing back from them regarding insurance coverage.    Rehab Potential Good   Clinical Impairments Affecting Rehab  Potential lymph nodes removed from both axilla, radiaton to left upper quarter, ongoing chemotherapy    PT Frequency 3x / week  2-3 times a week   PT Duration 6 weeks   PT Treatment/Interventions Passive range of motion;Manual lymph drainage;Manual techniques;Therapeutic exercise;Compression bandaging;Taping;ADLs/Self Care  Home Management;DME Instruction   PT Next Visit Plan complete decongestive therapy with increase frequency to 1x per week due to transportation and measure for garments when ready (possibly next 2 weeks as pt continues with treatment between visits at home).  continue discussion about nighttime options    Consulted and Agree with Plan of Care Patient      Patient will benefit from skilled therapeutic intervention in order to improve the following deficits and impairments:  Increased fascial restricitons, Impaired UE functional use, Obesity, Decreased activity tolerance, Decreased knowledge of precautions, Decreased skin integrity, Impaired perceived functional ability, Pain, Decreased scar mobility, Decreased knowledge of use of DME, Decreased mobility, Decreased strength, Increased edema, Postural dysfunction  Visit Diagnosis: Postmastectomy lymphedema  Abnormal posture  Stiffness of left shoulder, not elsewhere classified  Stiffness of right shoulder, not elsewhere classified     Problem List Patient Active Problem List   Diagnosis Date Noted  . Morbid obesity ([place]) 11/28/2015  . Chest wall pain 11/28/2015  . Bilateral breast cancer ([place]) 05/09/2015  . Breast cancer of upper-inner quadrant of right female breast ([place]) 04/27/2015  . Hypertension, essential 04/27/2015  . Breast cancer of upper-outer quadrant of left female breast ([place]) 04/21/2015    Otelia Limes, PTA 12/14/2015, 5:28 PM  [place], [place], [address] Phone: [phone]   Fax:  [phone]  Name:  BASSHEVA FLURY MRN: [id number] Date of Birth: Mar 16, 1961

## 2015-12-16 ENCOUNTER — Encounter: Payer: BLUE CROSS/BLUE SHIELD | Admitting: Physical Therapy

## 2015-12-22 ENCOUNTER — Ambulatory Visit: Payer: BLUE CROSS/BLUE SHIELD

## 2015-12-23 ENCOUNTER — Encounter: Payer: BLUE CROSS/BLUE SHIELD | Admitting: Physical Therapy

## 2015-12-24 ENCOUNTER — Other Ambulatory Visit: Payer: Self-pay | Admitting: Nurse Practitioner

## 2015-12-27 ENCOUNTER — Other Ambulatory Visit: Payer: Self-pay | Admitting: *Deleted

## 2015-12-27 DIAGNOSIS — C50412 Malignant neoplasm of upper-outer quadrant of left female breast: Secondary | ICD-10-CM

## 2015-12-27 MED ORDER — PANTOPRAZOLE SODIUM 40 MG PO TBEC: 40.0000 mg | DELAYED_RELEASE_TABLET | Freq: Every day | ORAL | Status: DC

## 2015-12-29 ENCOUNTER — Ambulatory Visit: Payer: BLUE CROSS/BLUE SHIELD

## 2015-12-29 DIAGNOSIS — I972 Postmastectomy lymphedema syndrome: Secondary | ICD-10-CM

## 2015-12-29 DIAGNOSIS — M25612 Stiffness of left shoulder, not elsewhere classified: Secondary | ICD-10-CM

## 2015-12-29 DIAGNOSIS — R293 Abnormal posture: Secondary | ICD-10-CM

## 2015-12-29 NOTE — Therapy (Signed)
[place], [place], [address] Phone: [phone]   Fax:  [phone]  Physical Therapy Treatment  Patient Details  Name: Margaret [name] MRN: [id number] Date of Birth: 07/04/1961 Referring Provider: Gentry Fitz   Encounter Date: 12/29/2015      PT End of Session - 12/29/15 1023    Visit Number 8   Number of Visits 17   Date for PT Re-Evaluation 01/26/16   PT Start Time 0935   PT Stop Time 1021   PT Time Calculation (min) 46 min   Activity Tolerance Patient tolerated treatment well   Behavior During Therapy [facility] for tasks assessed/performed      Past Medical History  Diagnosis Date  . Hypertension   . Breast cancer of upper-outer quadrant of left female breast ([place]) 04/21/2015  . Breast cancer ([place])   . GERD (gastroesophageal reflux disease)   . Bilateral breast cancer [facility])     Past Surgical History  Procedure Laterality Date  . Abdominal hysterectomy  1990  . Tubal ligation  1984  . Mastectomy w/ sentinel node biopsy Right   . Mastectomy modified radical Left 05/09/2015  . Mastectomy w/ sentinel node biopsy Right 05/09/2015    Procedure: RIGHT MASTECTOMY WITH RIGHT AXILLARY SENTINEL LYMPH NODE BIOPSY;  Surgeon: Autumn Messing III, MD;  Location: [place];  Service: General;  Laterality: Right;  . Mastectomy modified radical Left 05/09/2015    Procedure: LEFT MODIFIED RADICAL MASTETCTOMY;  Surgeon: Autumn Messing III, MD;  Location: [place];  Service: General;  Laterality: Left;  . Portacath placement Right 09/01/2015    Procedure: INSERTION PORT-A-CATH;  Surgeon: Autumn Messing III, MD;  Location: [place];  Service: General;  Laterality: Right;    There were no vitals filed for this visit.      Subjective Assessment - 12/29/15 0938    Subjective Monday morning when I woke up my hand was really tight(I forgot my bandages!) and then my Rt shoulder blade area really started hurting down to  my upper arm. I don't know what I've done. I took 4 Ibuprofen this morning.    Pertinent History bilateral mastectomy in October 2016 9 nodes removed from left side and 5 from the right side.   She has completed radition is now taking chemotherapy. She has it on Mondays every three weeks and has 2 more.  She has just started having tingling in hands and feet.  Otherwise she is doing OK    Patient Stated Goals pt wants to be able to wash her back, will be able to open jars and will have the swelling down    Currently in Pain? Yes   Pain Score 10-Worst pain ever   Pain Location Shoulder   Pain Orientation Left   Pain Descriptors / Indicators Shooting   Pain Type Chronic pain   Pain Radiating Towards from scapula to upper arm/elbow   Pain Onset In the past 7 days   Pain Frequency Constant  constant since Monday   Aggravating Factors  I don't what I did   Pain Relieving Factors Ibuprofen helps just enough to help me sleep               LYMPHEDEMA/ONCOLOGY QUESTIONNAIRE - 12/29/15 0946    Left Upper Extremity Lymphedema   15 cm Proximal to Olecranon Process 47.6 cm   10 cm Proximal to Olecranon Process 44.8 cm   Olecranon Process 35.5 cm   15 cm Proximal  to Ulnar Styloid Process 33.6 cm   10 cm Proximal to Ulnar Styloid Process 29.9 cm   Just Proximal to Ulnar Styloid Process 23.9 cm   Across Hand at [employer] 22.4 cm   At [place] of 2nd Digit 8.2 cm                  [facility] Adult PT Treatment/Exercise - 12/29/15 0001    Manual Therapy   Manual Lymphatic Drainage (MLD) In Supine: Short neck, superficial and deep abdominals, Lt inguinal nodes and Lt axillo-inguinal anastomosis and Lt UE from dorsal hand to lateral shoulder.    Compression Bandaging Biotone applied to left arm then issued new bandages as pt forgot hers and she has the one set from initial treatment; thick stockinette, elastomull x2 to hand and all fingers, Artiflex, 1-6 and 2-12 cm and added 1-12 cm short  stretch bandages from hand to axilla (husband to add another 12 cm to pts UE when she gets home)                    Short Term Clinic Goals - 12/14/15 1213    CC Short Term Goal  #1   Title Patient with verbalize an understanding of lymphedema risk reduction precautions   Status On-going   CC Short Term Goal  #2   Title Patient will be independent in a basic home exercise program so that she can tolerate the rest of chemo and perform her ADLs better   Status Achieved   CC Short Term Goal  #3   Title Patient will be independent in self manual lymph drainage so she can begin to manage her lymphedema at home    [place] - 12/14/15 1213    [place] Term Goal  #1   Title Patient will improve left and right shoulder abduction to 120 degrees so that she can perform ADLs with greater ease a home    Baseline 152 degrees attained on 12/14/15   Status Achieved   CC Long Term Goal  #2   Title Patient will report mproved ability to do household tasks of cooking and cleaning with greater ease    Status Achieved   CC Long Term Goal  #3   Title Patient will be know how to obtain and use compression garments for maintenance phase of treatment   Status On-going            Plan - 12/29/15 1210    Clinical Impression Statement Pt has had a flare up of pain since Monday from scapular region to upper arm/albow. She had her husband bandage her Tuesday but she felt it was too tight. She was very guarded with Lt UE upon arrival today and in obvious discomfort. Upon leaving after manual lymph drainage and having compression bandages applied she reported her pain was decreased and she was visibly in less pain and able to move arm better/less guarded. Her circumference measurements had increased from last time measured but she has not been in compression much last 4 days due to flare up. Pt is going to try to be in compression as much as possible until  next week and if reduced, call [facility] to get measured for her compression garments. Dr. Marjie Skiff has signed off on compression pump so that process is moving along as well.    Rehab Potential Good  Clinical Impairments Affecting Rehab Potential lymph nodes removed from both axilla, radiaton to left upper quarter, ongoing chemotherapy    PT Frequency 3x / week  Pt has been trying to come 1x/week due to transportation   PT Duration 4 weeks   PT Treatment/Interventions Passive range of motion;Manual lymph drainage;Manual techniques;Therapeutic exercise;Compression bandaging;Taping;ADLs/Self Care Home Management;DME Instruction   PT Next Visit Plan Renewal done for this visit. If pt returns cont complete decongestive therapy to get her reduced to the point she can get measured for a compression garment.   Consulted and Agree with Plan of Care Patient      Patient will benefit from skilled therapeutic intervention in order to improve the following deficits and impairments:  Increased fascial restricitons, Impaired UE functional use, Obesity, Decreased activity tolerance, Decreased knowledge of precautions, Decreased skin integrity, Impaired perceived functional ability, Pain, Decreased scar mobility, Decreased knowledge of use of DME, Decreased mobility, Decreased strength, Increased edema, Postural dysfunction  Visit Diagnosis: Postmastectomy lymphedema  Abnormal posture  Lymphedema syndrome, postmastectomy  Stiffness of left shoulder, not elsewhere classified     Problem List Patient Active Problem List   Diagnosis Date Noted  . Morbid obesity ([place]) 11/28/2015  . Chest wall pain 11/28/2015  . Bilateral breast cancer ([place]) 05/09/2015  . Breast cancer of upper-inner quadrant of right female breast ([place]) 04/27/2015  . Hypertension, essential 04/27/2015  . Breast cancer of upper-outer quadrant of left female breast ([place]) 04/21/2015    Otelia Limes, PTA 12/29/2015,  12:21 PM  [place], [place], [address] Phone: [phone]   Fax:  [phone]  Name: Margaret [name] MRN: [id number] Date of Birth: May 07, 1961

## 2015-12-30 ENCOUNTER — Ambulatory Visit (HOSPITAL_BASED_OUTPATIENT_CLINIC_OR_DEPARTMENT_OTHER): Payer: BLUE CROSS/BLUE SHIELD | Admitting: Oncology

## 2015-12-30 ENCOUNTER — Encounter: Payer: BLUE CROSS/BLUE SHIELD | Admitting: Physical Therapy

## 2015-12-30 DIAGNOSIS — C50412 Malignant neoplasm of upper-outer quadrant of left female breast: Secondary | ICD-10-CM

## 2015-12-30 NOTE — Progress Notes (Signed)
No show

## 2016-01-02 ENCOUNTER — Other Ambulatory Visit: Payer: Self-pay | Admitting: Nurse Practitioner

## 2016-01-04 NOTE — Addendum Note (Signed)
Addended by: Toribio Harbour on: 01/04/2016 08:58 AM   Modules accepted: Orders

## 2016-01-06 ENCOUNTER — Other Ambulatory Visit: Payer: Self-pay | Admitting: *Deleted

## 2016-01-06 MED ORDER — HYDROCHLOROTHIAZIDE 12.5 MG PO TABS: 12.5000 mg | ORAL_TABLET | Freq: Every day | ORAL | Status: DC

## 2016-01-20 ENCOUNTER — Telehealth: Payer: Self-pay | Admitting: Nurse Practitioner

## 2016-01-20 NOTE — Telephone Encounter (Signed)
S/w pt, advised apt chg from 6/29 to 6/27 at 10am. Pt verbalized understanding.

## 2016-01-31 ENCOUNTER — Telehealth: Payer: Self-pay | Admitting: Nurse Practitioner

## 2016-01-31 ENCOUNTER — Encounter: Payer: Self-pay | Admitting: Nurse Practitioner

## 2016-01-31 ENCOUNTER — Other Ambulatory Visit: Payer: Self-pay | Admitting: *Deleted

## 2016-01-31 ENCOUNTER — Ambulatory Visit (HOSPITAL_BASED_OUTPATIENT_CLINIC_OR_DEPARTMENT_OTHER): Payer: BLUE CROSS/BLUE SHIELD | Admitting: Nurse Practitioner

## 2016-01-31 ENCOUNTER — Encounter: Payer: Self-pay | Admitting: *Deleted

## 2016-01-31 VITALS — BP 157/89 | HR 95 | Temp 98.7°F | Resp 18 | Ht 69.0 in | Wt 296.3 lb

## 2016-01-31 DIAGNOSIS — C50411 Malignant neoplasm of upper-outer quadrant of right female breast: Secondary | ICD-10-CM

## 2016-01-31 DIAGNOSIS — C50911 Malignant neoplasm of unspecified site of right female breast: Secondary | ICD-10-CM

## 2016-01-31 DIAGNOSIS — R0789 Other chest pain: Secondary | ICD-10-CM | POA: Diagnosis not present

## 2016-01-31 DIAGNOSIS — C50912 Malignant neoplasm of unspecified site of left female breast: Secondary | ICD-10-CM

## 2016-01-31 DIAGNOSIS — G629 Polyneuropathy, unspecified: Secondary | ICD-10-CM

## 2016-01-31 MED ORDER — OXYCODONE-ACETAMINOPHEN 5-325 MG PO TABS: 1.0000 | ORAL_TABLET | Freq: Three times a day (TID) | ORAL | Status: DC | PRN

## 2016-01-31 MED ORDER — GABAPENTIN 300 MG PO CAPS: 600.0000 mg | ORAL_CAPSULE | Freq: Three times a day (TID) | ORAL | Status: DC

## 2016-01-31 MED FILL — OXYCODONE/APAP 5/325MG: 5-325 | 8 days supply | Qty: 25 | Fill #0

## 2016-01-31 MED FILL — GABAPENTIN 300 MG CAPSULE: 300 | 30 days supply | Qty: 180 | Fill #0

## 2016-01-31 NOTE — Progress Notes (Signed)
CLINIC:  Cancer Survivorship   REASON FOR VISIT:  Routine follow-up post-treatment for a recent history of breast cancer.  BRIEF ONCOLOGIC HISTORY:    Breast cancer of upper-outer quadrant of left female breast ([place])   04/13/2015 Mammogram Left breast: 4 cm irregular mass 2:00, middle depth; 1.7 cm area of architectural distortion at 900, middle depth   04/19/2015 Initial Biopsy Left breast core bx x 2 / left axillary LN bx: invasive ductal carcinoma, grade 1-2, ER+, PR+, HER2/neu negative, Ki67 5-10%.   04/19/2015 Clinical Stage Stage IIIA: T3 N1   05/09/2015 Definitive Surgery Left mastectomy: Invasive ductal carcioma, grade 2, negative margins, repeat HER2/neg   05/09/2015 Pathologic Stage Stage IIIA: pT3 pN2   05/09/2015 Procedure Mammaprint: luminal type, low risk   06/27/2015 - 08/17/2015 Radiation Therapy Adjuvant RT left chest wall, axilla and SCV: 45 Gy in 25 fractions; 14.4 Gy boost in 8 fractions   09/19/2015 - 11/21/2015 Chemotherapy Docetaxel and cyclophosphamide x 4    Anti-estrogen oral therapy Planned with consideration of PALLAS trial; to discuss with Dr. Jana Hakim ASAP    Breast cancer of upper-inner quadrant of right female breast ([place])   04/13/2015 Mammogram Right breast: 1.3 cm mass at 11:00, posterior depth   04/19/2015 Initial Biopsy Right breast bx: invasive ductal carcinoma, grade 1, ER+, PR-, HER2/neu negative, Ki67 5%   04/19/2015 Clinical Stage Stage IA: T1c N0   05/09/2015 Definitive Surgery Right mastectomy: invasive ductal carcinoma, repeat HER2/neu negative, negative margins   05/09/2015 Pathologic Stage Stage IIA: pT2 pN0   05/09/2015 Oncotype testing RS 12 (8% ROR)   09/19/2015 - 11/21/2015 Chemotherapy Docetaxel and cyclophosphamide x 4    Anti-estrogen oral therapy Planned with consideration of PALLAS trial; to discuss with Dr. Jana Hakim ASAP    INTERVAL HISTORY:  Margaret [name] presents to the [facility] today for our initial meeting to review her survivorship  care plan detailing her treatment course for breast cancer, as well as monitoring long-term side effects of that treatment, education regarding health maintenance, screening, and overall wellness and health promotion.     Overall, Margaret [name] reports feeling fairly well since completing her chemotherapy two months ago. She reports increasing pain along her chest wall over the past two weeks that she thought was related to her prostheses.  She denies any rash, injury or trauma, although reports that her extended family was recently in town (they moved their family reunion to [place] so that she did not have to travel) and she may have "overdone" things.  She states that she takes gabapentin but "it is not helping my chest." When asked, she states that she is taking "4-5 200 mg tablets of gabapentin 2-3 times a day," but moments later, states she is taking "4 ibuprofen 3 times / day."  When further questioned, she states she is only taking the gabapentin 1 tablet 3 times / day but is taking up to 800 mg ibuprofen "4-5 times / day."  She is out of her oxycodone.  She denies chest / cardiac pain, pointing to the area along her incisions.  She has had no shortness of breath or nausea.  No radiation to the jaw or shoulder.  She denies headache, cough or bone pain.  She has a good appetite and denies weight loss.  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 She is fatigued but able to perform her activities of daily living.  She continues  with left arm lymphedema and uses the pump twice daily, although she did not use it this morning prior to her appointment.  She needs a signature on the form for her compression sleeve, but she did not bring this today.  She questions whether it is possible to have her port a cath removed.    REVIEW OF SYSTEMS:  General: Fatigue and pain, as above. Denies fever, chills, unintentional weight loss, or night sweats. HEENT:  Denies visual changes, hearing loss, mouth sores or difficulty swallowing. Cardiac: As above. Respiratory: Denies wheeze or dyspnea on exertion.  Breast: As above. GI: Denies abdominal pain, constipation, diarrhea, nausea, or vomiting.  GU: Denies dysuria, hematuria, vaginal bleeding, vaginal discharge, or vaginal dryness.  Musculoskeletal: As above.  Neuro: As above. Skin: Denies rash, pruritis, or open wounds.  Psych: Denies depression, anxiety, insomnia, or memory loss.   A 14-point review of systems was completed and was negative, except as noted above.   ONCOLOGY TREATMENT TEAM:  1. Surgeon:  Dr. Marlou Starks at [facility]  2. Medical Oncologist: Dr. Jana Hakim 3. Radiation Oncologist: Dr. Sondra Come    PAST MEDICAL/SURGICAL HISTORY:  Past Medical History  Diagnosis Date  . Hypertension   . Breast cancer of upper-outer quadrant of left female breast ([place]) 04/21/2015  . Breast cancer ([place])   . GERD (gastroesophageal reflux disease)   . Bilateral breast cancer [facility])    Past Surgical History  Procedure Laterality Date  . Abdominal hysterectomy  1990  . Tubal ligation  1984  . Mastectomy w/ sentinel node biopsy Right   . Mastectomy modified radical Left 05/09/2015  . Mastectomy w/ sentinel node biopsy Right 05/09/2015    Procedure: RIGHT MASTECTOMY WITH RIGHT AXILLARY SENTINEL LYMPH NODE BIOPSY;  Surgeon: Autumn Messing III, MD;  Location: [place];  Service: General;  Laterality: Right;  . Mastectomy modified radical Left 05/09/2015    Procedure: LEFT MODIFIED RADICAL MASTETCTOMY;  Surgeon: Autumn Messing III, MD;  Location: [place];  Service: General;  Laterality: Left;  . Portacath placement Right 09/01/2015    Procedure: INSERTION PORT-A-CATH;  Surgeon: Autumn Messing III, MD;  Location: [place];  Service: General;  Laterality: Right;     ALLERGIES:  Allergies  Allergen Reactions  . Latex Itching and Other (See Comments)    burning  . 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 Wheat Bran Hives     CURRENT MEDICATIONS:  Current Outpatient Prescriptions on File Prior to Visit  Medication Sig Dispense Refill  . acetaminophen (TYLENOL) 500 MG tablet Take 1,000 mg by mouth every 6 (six) hours as needed for mild pain. Reported on 11/28/2015    . cyclobenzaprine (FLEXERIL) 5 MG tablet Take 1 tablet (5 mg total) by mouth 3 (three) times daily as needed for muscle spasms. 30 tablet 0  . hydrochlorothiazide (HYDRODIURIL) 12.5 MG tablet Take 1 tablet (12.5 mg total) by mouth daily. 30 tablet 3  . lidocaine-prilocaine (EMLA) cream Apply to affected area once 30 g 3  . losartan (COZAAR) 50 MG tablet Take 1 tablet (50 mg total) by mouth daily. 30 tablet 1  . naproxen (NAPROSYN) 500 MG tablet Take 1 tablet (500 mg total) by mouth 2 (two) times daily with a meal. 60 tablet 1  . pantoprazole (PROTONIX) 40 MG tablet Take 1 tablet (40 mg total) by mouth daily. 30 tablet 1  . valACYclovir (VALTREX) 500 MG tablet Take 1 tablet (500 mg  total) by mouth daily. 30 tablet 1  . venlafaxine XR (EFFEXOR XR) 75 MG 24 hr capsule Take 1 capsule (75 mg total) by mouth daily with breakfast. 90 capsule 2   No current facility-administered medications on file prior to visit.     ONCOLOGIC FAMILY HISTORY:  Family History  Problem Relation Age of Onset  . Hypertension Mother   . Diabetes Mother      GENETIC COUNSELING/TESTING: No    SOCIAL HISTORY:  JNAYA BUTRICK is single and lives with her family in [place], [place].  She has 4 children. Ms. Nowotny is currently working part time as a Scientist, research (medical) at [employer].  She is a former smoker and denies any current or history of alcohol or illicit drug use.     PHYSICAL EXAMINATION:  Vital Signs: Filed Vitals:   01/31/16 1019  BP: 157/89  Pulse: 95  Temp: 98.7 F (37.1 C)  Resp: 18   ECOG Performance Status: 0  General: Well-nourished, well-appearing female in no acute  distress.  She is unaccompanied in clinic today.   HEENT: Head is atraumatic and normocephalic.  Pupils equal and reactive to light and accomodation. Conjunctivae clear without exudate.  Sclerae anicteric. Oral mucosa is pink, moist, and intact without lesions.  Oropharynx is pink without lesions or erythema.  Lymph: No cervical, supraclavicular, infraclavicular, or axillary lymphadenopathy noted on palpation.  Cardiovascular: Regular rate and rhythm without murmurs, rubs, or gallops. Respiratory: Clear to auscultation bilaterally. Chest expansion symmetric without accessory muscle use on inspiration or expiration.  Breast: Bilateral mastectomy incisions intact without nodularity or mass.  No swelling, erythema or mass along chest wall bilaterally.  Very tender to palpation. GI: Abdomen soft and round. No tenderness to palpation. Bowel sounds normoactive in 4 quadrants.  GU: Deferred.  Neuro: No focal deficits. Steady gait.  Psych: Mood and affect normal and appropriate for situation.  Extremities: No edema, cyanosis, or clubbing.  Skin: Warm and dry. No open lesions noted.   LABORATORY DATA:  None for this visit.  DIAGNOSTIC IMAGING:  None for this visit.     ASSESSMENT AND PLAN:   1. Breast cancer: Bilateral (left - Stage IIIA and right - Stage IA) invasive ductal carcinomas of the breast, ER positive (bilateral), PR positive (left), HER2/neu negative (bilateral), S/P bilateral mastectomy (05/2015) followed by adjuvant radiation to the left breast (completed 08/2015) followed by adjuvant chemotherapy with docetaxel and cyclophosphamide x 4 (completed 11/2015) now followed in a program of surveillance with plans to discuss adjuvant endocrine therapy with Dr. Jana Hakim next week.  Margaret [name] missed her last follow up appointment with Dr. Jana Hakim in [facility] 2017 and has been rescheduled for 02/08/2016.  We will evaluate her chest wall pain (see #2 below) prior to that visit and she will return at that  time for consideration of adjuvant endocrine therapy.  We have discussed the intent of such therapy today as well as possible types of anti-estrogen therapy and their side effect profiles.  A comprehensive survivorship care plan and treatment summary was reviewed with the patient today detailing her breast cancer diagnosis, treatment course, potential late/long-term effects of treatment, appropriate follow-up care with recommendations for the future, and patient education resources.  A copy of this summary, along with a letter will be sent to the patient's primary care provider via in basket message after today's visit.  Margaret [name] is welcome to return to the Survivorship Clinic in the future, as needed; no follow-up will be scheduled at  this time.  I have encouraged her to discuss with Dr. Jana Hakim her desire to have her port a cath removed and have asked her to bring in any necessary forms for signature for her compression sleeve.    2. Chest wall pain: I believe that Margaret [name]'s pain is an exacerbation of her existing chest wall pain.  I do not appreciate any erythema, swelling, or nodularity along either incision or within the skin overlying the chest wall.  However, as it has been increasing over the past two weeks, I have elected to have her undergo ultrasound to unsure there is no subcutaneous swelling or other findings contributing to her pain.  I have asked her to take her gabapentin 600 mg TID (vs. the schedule she reports above) as well as decreasing her ibuprofen use as she is taking beyond what is recommended. I have refilled her oxycodone for one week for use if the increased gabapentin does not aid her pain, until she can have the ultrasound and when she sees Dr. Jana Hakim next week.  Further disposition pending those results.   3. Bone health:  Given Margaret [name]'s age/history of breast cancer and her potential treatment regimen including endocrine therapy with aromatase inhibitors, she may be  at for bone demineralization.  Per our records, she has not yet undergone DEXA scan.  She will discuss this further with Dr. Jana Hakim at her upcoming appointment and undergo DEXA scanning pending their decision about endocrine therapy.   In the meantime, she was encouraged to increase her consumption of foods rich in calcium and vitamin D as well as to increase her weight-bearing activities.  She was given education on specific activities to promote bone health.  4. Cancer screening:  Due to Margaret [name]'s history and her age, she should receive screening for skin cancers, colon cancer, and gynecologic cancers.  The information and recommendations are listed on the patient's comprehensive care plan/treatment summary and were reviewed in detail with the patient.    5. Health maintenance and wellness promotion: Margaret [name] was encouraged to consume 5-7 servings of fruits and vegetables per day. We reviewed the "Nutrition Rainbow" handout, as well as discussed recommendations to maximize nutrition and minimize recurrence, such as increased intake of fruits, vegetables, lean proteins, and minimizing the intake of red meats and processed foods.  She was also encouraged to engage in moderate to vigorous exercise for 30 minutes per day most days of the week. We discussed the [employer] fitness program, which is designed for cancer survivors to help them become more physically fit after cancer treatments.  She was instructed to limit her alcohol consumption and continue to abstain from tobacco use. A copy of the "Take Control of Your Health" brochure was given to her reinforcing these recommendations.   6. Support services/counseling: It is not uncommon for this period of the patient's cancer care trajectory to be one of many emotions and stressors. We discussed an opportunity for her to participate in the next session of [facility] ("Finding Your New Normal") support group series designed for patients after they have  completed treatment.  Margaret [name] was encouraged to take advantage of our many other support services programs, support groups, and/or counseling in coping with her new life as a cancer survivor after completing anti-cancer treatment.  She was offered support today through active listening and expressive supportive counseling.  She was given information regarding our available services and encouraged to contact me with any questions or for help enrolling in  any of our support group/programs.    A total of 40 minutes of face-to-face time was spent with this patient with greater than 50% of that time in counseling and care-coordination.   Sylvan Cheese, NP  Survivorship Program [place] [phone]   Note: PRIMARY CARE PROVIDER Hoyt Koch, [place] [phone]

## 2016-01-31 NOTE — Telephone Encounter (Signed)
appt made and avs printed . Brast ultrasound sched for 6/29 at 1045 am at [facility]

## 2016-02-02 ENCOUNTER — Encounter: Payer: BLUE CROSS/BLUE SHIELD | Admitting: Nurse Practitioner

## 2016-02-08 ENCOUNTER — Encounter: Payer: BLUE CROSS/BLUE SHIELD | Admitting: *Deleted

## 2016-02-08 ENCOUNTER — Telehealth: Payer: Self-pay | Admitting: *Deleted

## 2016-02-08 ENCOUNTER — Telehealth: Payer: Self-pay | Admitting: Oncology

## 2016-02-08 ENCOUNTER — Ambulatory Visit (HOSPITAL_BASED_OUTPATIENT_CLINIC_OR_DEPARTMENT_OTHER): Payer: BLUE CROSS/BLUE SHIELD | Admitting: Oncology

## 2016-02-08 ENCOUNTER — Encounter: Payer: Self-pay | Admitting: *Deleted

## 2016-02-08 VITALS — BP 145/91 | HR 89 | Temp 97.5°F | Resp 18 | Ht 69.0 in | Wt 298.0 lb

## 2016-02-08 DIAGNOSIS — C50211 Malignant neoplasm of upper-inner quadrant of right female breast: Secondary | ICD-10-CM

## 2016-02-08 DIAGNOSIS — C50912 Malignant neoplasm of unspecified site of left female breast: Secondary | ICD-10-CM

## 2016-02-08 DIAGNOSIS — C50411 Malignant neoplasm of upper-outer quadrant of right female breast: Secondary | ICD-10-CM | POA: Diagnosis not present

## 2016-02-08 DIAGNOSIS — C50412 Malignant neoplasm of upper-outer quadrant of left female breast: Secondary | ICD-10-CM

## 2016-02-08 MED ORDER — ANASTROZOLE 1 MG PO TABS: 1.0000 mg | ORAL_TABLET | Freq: Every day | ORAL | Status: DC

## 2016-02-08 MED FILL — ANASTROZOLE 1 MG TABLET: 1 | 90 days supply | Qty: 90 | Fill #0

## 2016-02-08 NOTE — Telephone Encounter (Signed)
per of to sch pt appt-gave pt copy of favs

## 2016-02-08 NOTE — Progress Notes (Signed)
[place]  Telephone:([phone]) [phone] Fax:(336) [phone]   ID: Margaret [name] DOB: [birth year]  MR#: [id number]  MGQ#:[id number]  Patient Care Team: Hoyt Koch, MD as PCP - General (Internal Medicine) Autumn Messing III, MD as Consulting Physician (General Surgery) Chauncey Cruel, MD as Consulting Physician (Oncology) Gery Pray, MD as Consulting Physician (Radiation Oncology) Mauro Kaufmann, RN as Registered Nurse Rockwell Germany, RN as Registered Nurse PCP: Hoyt Koch, MD GYN: OTHER MD:  CHIEF COMPLAINT: Locally advanced estrogen receptor positive breast cancer; bilateral breast cancer  CURRENT TREATMENT: anastrozole  BREAST CANCER HISTORY: From the original intake note:  Margaret [name]'s primary care physician retired sometime ago and her health maintenance has not been up-to-date. She has been receiving medical care through the emergency room and was seen in March 2015 following a head injury, then in August 2015 for lancing of an abscess. In December 2015 she noticed that her left breast looked a little bit smaller than her right. She brought this to the attention of friends and family over the next several months but the general feeling was that most people are not perfectly symmetrical. By the summer of this year she noticed that her nipple on the left was "going in". More recently she started developing pain in the left breast. She was evaluated for this in the emergency room 04/09/2015 at which time a left breast exam showed a deformed left breast with a large hard mass encompassing most of the breast, with nipple retraction. There was no nipple discharge or bleeding and no palpable adenopathy.  The patient was referred to [facility] and on 04/13/2015 she underwent bilateral diagnostic mammography with tomosynthesis as well as bilateral breast ultrasound. The breast density was category B. In the right breast at the 11:00 position there was a 1.3 cm irregular mass  which by ultrasonography measured 1.3 cm.--In the left breast there was a 4 cm irregular mass at the 2:00 position associated with nipple retraction. There was a second, 1.7 cm area of architectural distortion at the 9:00 position. Both were palpable. By ultrasonography, the 4 cm irregular mass was noted, with a second mass measuring 2.5 cm by ultrasonography. Both axillae were benign.  On 04/19/2015 the patient underwent right breast upper outer quadrant biopsy, showing ([id number]) and invasive ductal carcinoma, grade 1, estrogen receptor 90% positive, progesterone receptor negative, with an MIB-1 of 5%, and no HER-2 amplification, the signals ratio being 1.26 and the number per cell 2.20.  On the same day, she underwent biopsy of the 2 left breast masses in question as well as a suspicious left axillary lymph node. All 3 biopsies showed invasive ductal carcinoma, grade 2, estrogen receptor 80-100% positive, progesterone receptor 80-100% positive, with the MIB-1 between 5 and 10%, and no HER-2 amplification, the signals ratio being between 1.05 and 1.13, and the number per cell between 2.10 and 2.25.  The patient's subsequent history is as detailed below  INTERVAL HISTORY: Margaret [name] returns today for follow-up of her estrogen receptor positivel breast cancers. She will she was supposed to see me 12/30/2015 for some reason did not show. She then saw the survivorship nurse practitioner on 01/31/2016 and was rescheduled to see me today. The option of all that is that she still has not started her anti-estrogens.  REVIEW OF SYSTEMS: Margaret [name] has some shooting pains in the surgical breast, which concerned her. She has been taking various analgesics but mostly gabapentin for this. She worries what it may mean and  specifically whether it means that she still has cancer in that breast. She is trying to obtain a compression sleeve and brought some of the paperwork today so we could get it done together. She is not  exercising regularly. She is thinking of volunteering or at least doing some testimony where she used to work at [employer] because she feels a lot of the people she worked with our and a similar situation or could be and she could help them understand what can be done. She denies unusual headaches, visual changes, nausea, vomiting, or gait imbalance. There has been no cough, phlegm production or pleurisy. There has been no change in bowel or bladder habits. A detailed review of systems today was otherwise stable  PAST MEDICAL HISTORY: Past Medical History  Diagnosis Date  . Hypertension   . Breast cancer of upper-outer quadrant of left female breast ([place]) 04/21/2015  . Breast cancer ([place])   . GERD (gastroesophageal reflux disease)   . Bilateral breast cancer ([place])     PAST SURGICAL HISTORY: Past Surgical History  Procedure Laterality Date  . Abdominal hysterectomy  1990  . Tubal ligation  1984  . Mastectomy w/ sentinel node biopsy Right   . Mastectomy modified radical Left 05/09/2015  . Mastectomy w/ sentinel node biopsy Right 05/09/2015    Procedure: RIGHT MASTECTOMY WITH RIGHT AXILLARY SENTINEL LYMPH NODE BIOPSY;  Surgeon: Autumn Messing III, MD;  Location: [place];  Service: General;  Laterality: Right;  . Mastectomy modified radical Left 05/09/2015    Procedure: LEFT MODIFIED RADICAL MASTETCTOMY;  Surgeon: Autumn Messing III, MD;  Location: [place];  Service: General;  Laterality: Left;  . Portacath placement Right 09/01/2015    Procedure: INSERTION PORT-A-CATH;  Surgeon: Autumn Messing III, MD;  Location: [place];  Service: General;  Laterality: Right;    FAMILY HISTORY Family History  Problem Relation Age of Onset  . Hypertension Mother   . Diabetes Mother     the patient has little information on her father. Her mother is living, currently age 68. She has one brother, 2 sisters. There is no history of breast or ovarian cancer in the family to her knowledge.   GYNECOLOGIC  HISTORY:  No LMP recorded. Patient has had a hysterectomy.  menarche age 30, first live birth age 32. She is GX P4. She underwent a total abdominal hysterectomy with bilateral salpingo-oophorectomy at age 29. She did not take hormone replacement. She never used oral contraceptives.   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 She is not employed by [employer] but by a Set designer. She is single and lives with her significant other Margaret [name]. He is status post renal transplant. The patient's daughter  Margaret [name] works as a Programmer, applications, as does her daughter  Margaret [name]. Daughter Margaret [name] works at [employer], as a call. All 3 live in [place]. The patient's son Margaret [name]'s lives in [place]. He prepares sets for shows The patient has 4 grandchildren, no great grandchildren. She attends a [employer] in [place].     ADVANCED DIRECTIVES: Not in place. At the initial clinic visit the patient was given the appropriate forms to complete and notarize at her discretion. The patient intends to name her daughter Sampson Si as healthcare power of attorney. She can be reached at [phone].  HEALTH MAINTENANCE: Social History  Substance Use Topics  . Smoking status: Former  Smoker -- 0.10 packs/day for 28 years  . Smokeless tobacco: Never Used  . Alcohol Use: No     Colonoscopy: Never  PAP: Status post hysterectomy  Bone density: Never  Lipid panel:  Allergies  Allergen Reactions  . Latex Itching and Other (See Comments)    burning  . 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 Wheat Bran Hives    Current Outpatient Prescriptions  Medication Sig Dispense Refill  . acetaminophen (TYLENOL) 500 MG tablet Take 1,000 mg by mouth every 6 (six) hours as needed for mild pain. Reported on 11/28/2015    . cyclobenzaprine (FLEXERIL) 5 MG tablet Take 1 tablet (5 mg total) by  mouth 3 (three) times daily as needed for muscle spasms. 30 tablet 0  . dexamethasone (DECADRON) 4 MG tablet TAKE 2 TABLETS 2 TIMES DAILY,START DAY BEFORE TAXOTERE,AGAIN DAY AFTER CHEMO-3 DAYS  1  . fluconazole (DIFLUCAN) 100 MG tablet Take 100 mg by mouth daily.  1  . gabapentin (NEURONTIN) 300 MG capsule Take 2 capsules (600 mg total) by mouth 3 (three) times daily. 180 capsule 0  . hydrochlorothiazide (HYDRODIURIL) 12.5 MG tablet Take 1 tablet (12.5 mg total) by mouth daily. 30 tablet 3  . lidocaine-prilocaine (EMLA) cream Apply to affected area once 30 g 3  . LORazepam (ATIVAN) 0.5 MG tablet TAKE 1 TABLET AT BEDTIME AS NEEDED FOR NAUSEA AND VOMITING  0  . losartan (COZAAR) 50 MG tablet Take 1 tablet (50 mg total) by mouth daily. 30 tablet 1  . naproxen (NAPROSYN) 500 MG tablet Take 1 tablet (500 mg total) by mouth 2 (two) times daily with a meal. 60 tablet 1  . oxyCODONE-acetaminophen (ROXICET) 5-325 MG tablet Take 1 tablet by mouth every 8 (eight) hours as needed for severe pain. 25 tablet 0  . pantoprazole (PROTONIX) 40 MG tablet Take 1 tablet (40 mg total) by mouth daily. 30 tablet 1  . valACYclovir (VALTREX) 500 MG tablet Take 1 tablet (500 mg total) by mouth daily. 30 tablet 1  . venlafaxine XR (EFFEXOR XR) 75 MG 24 hr capsule Take 1 capsule (75 mg total) by mouth daily with breakfast. 90 capsule 2   No current facility-administered medications for this visit.    OBJECTIVE: Middle-aged African-American woman Who appears stated age 55 Vitals:   02/08/16 1025  BP: 145/91  Pulse: 89  Temp: 97.5 F (36.4 C)  Resp: 18     Body mass index is 43.99 kg/(m^2).    ECOG FS:1 - Symptomatic but completely ambulatory   Sclerae unicteric, pupils round and equal Oropharynx clear and moist-- no thrush or other lesions No cervical or supraclavicular adenopathy Lungs no rales or rhonchi Heart regular rate and rhythm Abd soft, obese, nontender, positive bowel sounds MSK no focal spinal  tenderness, no upper extremity lymphedema Neuro: nonfocal, well oriented, appropriate affect Breasts: Status post bilateral mastectomies and also status post left chest wall radiation. On the right the incision has healed very nicely. This is the area where she hurts. The patient of this area elicits mild tenderness, but there is no erythema or swelling. The right axilla is benign. On the left there is some hyperpigmentation. There is no tenderness to palpation. There is no evidence of local recurrence. The left axilla is benign.    LAB RESULTS:  CMP     Component Value Date/Time   NA 141 11/28/2015 1030   NA 136 06/29/2015 0923   K  3.4* 11/28/2015 1030   K 4.0 06/29/2015 0923   CL 102 06/29/2015 0923   CO2 26 11/28/2015 1030   CO2 24 06/29/2015 0923   GLUCOSE 112 11/28/2015 1030   GLUCOSE 107* 06/29/2015 0923   BUN 19.4 11/28/2015 1030   BUN 7 06/29/2015 0923   CREATININE 0.9 11/28/2015 1030   CREATININE 0.84 06/29/2015 0923   CALCIUM 8.7 11/28/2015 1030   CALCIUM 9.2 06/29/2015 0923   PROT 6.8 11/28/2015 1030   PROT 7.8 06/29/2015 0923   ALBUMIN 3.4* 11/28/2015 1030   ALBUMIN 3.5 06/29/2015 0923   AST 13 11/28/2015 1030   AST 20 06/29/2015 0923   ALT 20 11/28/2015 1030   ALT 21 06/29/2015 0923   ALKPHOS 118 11/28/2015 1030   ALKPHOS 87 06/29/2015 0923   BILITOT 0.31 11/28/2015 1030   BILITOT 1.0 06/29/2015 0923   GFRNONAA >60 06/29/2015 0923   GFRAA >60 06/29/2015 0923    INo results found for: SPEP, UPEP  Lab Results  Component Value Date   WBC 7.8 11/28/2015   NEUTROABS 4.9 11/28/2015   HGB 12.6 11/28/2015   HCT 38.7 11/28/2015   MCV 91.7 11/28/2015   PLT 213 11/28/2015      Chemistry      Component Value Date/Time   NA 141 11/28/2015 1030   NA 136 06/29/2015 0923   K 3.4* 11/28/2015 1030   K 4.0 06/29/2015 0923   CL 102 06/29/2015 0923   CO2 26 11/28/2015 1030   CO2 24 06/29/2015 0923   BUN 19.4 11/28/2015 1030   BUN 7 06/29/2015 0923    CREATININE 0.9 11/28/2015 1030   CREATININE 0.84 06/29/2015 0923      Component Value Date/Time   CALCIUM 8.7 11/28/2015 1030   CALCIUM 9.2 06/29/2015 0923   ALKPHOS 118 11/28/2015 1030   ALKPHOS 87 06/29/2015 0923   AST 13 11/28/2015 1030   AST 20 06/29/2015 0923   ALT 20 11/28/2015 1030   ALT 21 06/29/2015 0923   BILITOT 0.31 11/28/2015 1030   BILITOT 1.0 06/29/2015 0923       No results found for: LABCA2  No components found for: TKPTW656  No results for input(s): INR in the last 168 hours.  Urinalysis    Component Value Date/Time   COLORURINE YELLOW 06/29/2015 1030   APPEARANCEUR CLOUDY* 06/29/2015 1030   LABSPEC 1.019 06/29/2015 1030   PHURINE 7.0 06/29/2015 1030   GLUCOSEU NEGATIVE 06/29/2015 1030   HGBUR TRACE* 06/29/2015 1030   BILIRUBINUR NEGATIVE 06/29/2015 1030   KETONESUR NEGATIVE 06/29/2015 1030   PROTEINUR NEGATIVE 06/29/2015 1030   NITRITE NEGATIVE 06/29/2015 1030   LEUKOCYTESUR MODERATE* 06/29/2015 1030    STUDIES: No results found.   ASSESSMENT: 55 y.o. [place] woman with synchronous breast cancers, as follows  (a) status post right breast upper outer quadrant biopsy 04/19/2015 for a clinical pT1c N0, stage IA invasive ductal carcinoma, grade 1, estrogen receptor positive, progesterone receptor negative, with an MIB-1 of 5%, and no HER-2 amplification  (b) status post left breast biopsy 2 and left axillary lymph node biopsy 04/19/2015 for a clinical T3 N1, stage IIIA invasive ductal carcinoma, grade 1 or 2, estrogen receptor and progesterone receptor positive, HER-2 negative, with an MIB-1 between 5 and 10%  (1) status post bilateral mastectomies 05/09/2015, showing:   (a) on the right side, a pT2 pN0, stage IIA invasive ductal carcinoma, with negative margins and repeat HER-2 again negative    (b) on the left, a pT3 pN2, stage  IIIA invasive ductal carcinoma, grade 2, with negative margins, and repeat HER-2 again negative  (2) Mammaprint from  the left-sided tumor returned "luminal type, low risk", predicting a small benefit from chemotherapy with the important caveat that N2 disease was not included in the MINDACT study--patient opted for chemotherapy  (3) Oncotype from the right-sided tumor showed a score of 12, predicting an 8% risk of recurrence outside the breast within the next 10 years, if the patient's only systemic therapy was tamoxifen for 5 years. It also predicted no significant benefit from chemotherapy  (4) postmastectomy radiation to the left chest wall completed 08/15/2015  (5) adjuvant chemotherapy consisting of cyclophosphamide and docetaxel x4 completed on 11/21/15.  (5) anastrozole started 02/08/2016  (6) left upper extremity lymphedema  (7) signed for B-WELL study 02/08/2016  PLAN: Margaret [name] has recovered very nicely from her radiation treatments and is now ready to start anti-estrogens.   I think she would be a particularly good candidate for anastrozole and we discussed the possible toxicities, side effects and complications of that agent. She will started today. She will let us know if she develops any unusual side effects.  We reviewed the fact that postoperative pain is common and can actually be persistent. Sometimes the pain disappears 4 months or even a year or 2 and then recurs. This is due to trauma to the tissue and not because of breast cancer recurrence. In fact breast cancer recurrence in the breast generally is painless. She did find this reassuring.  She is going to see me again in 2 months just to make sure that she is tolerating the anastrozole well. If she qualifies for PALLAS I will strongly encourage her to participate given her overall risk of recurrence.  She knows tocall for any problems that may develop before the next visit Eleora Sutherland C, MD   02/08/2016 10:45 AM

## 2016-02-08 NOTE — Telephone Encounter (Signed)
Added lab & research apt per pof, pt aware per pof

## 2016-02-08 NOTE — Progress Notes (Signed)
02/08/2016 See separate Consent form encounters for the [facility] [id number] BWEL and DCP-001 research studies for details regarding today's visit. Cindy S. Brigitte Pulse BSN, RN, [place] 02/08/2016 2:31 PM

## 2016-02-16 ENCOUNTER — Encounter: Payer: BLUE CROSS/BLUE SHIELD | Admitting: *Deleted

## 2016-02-16 ENCOUNTER — Other Ambulatory Visit: Payer: Self-pay | Admitting: *Deleted

## 2016-02-16 ENCOUNTER — Encounter: Payer: Self-pay | Admitting: *Deleted

## 2016-02-16 ENCOUNTER — Telehealth: Payer: Self-pay | Admitting: *Deleted

## 2016-02-16 ENCOUNTER — Other Ambulatory Visit (HOSPITAL_BASED_OUTPATIENT_CLINIC_OR_DEPARTMENT_OTHER): Payer: BLUE CROSS/BLUE SHIELD

## 2016-02-16 VITALS — Ht 68.25 in | Wt 291.4 lb

## 2016-02-16 DIAGNOSIS — C50211 Malignant neoplasm of upper-inner quadrant of right female breast: Secondary | ICD-10-CM

## 2016-02-16 DIAGNOSIS — C50412 Malignant neoplasm of upper-outer quadrant of left female breast: Secondary | ICD-10-CM

## 2016-02-16 DIAGNOSIS — G629 Polyneuropathy, unspecified: Secondary | ICD-10-CM

## 2016-02-16 LAB — GLUCOSE (CC13): Glucose: 141 mg/dl — ABNORMAL HIGH (ref 70–140)

## 2016-02-16 LAB — RESEARCH LABS

## 2016-02-16 MED ORDER — CYCLOBENZAPRINE HCL 5 MG PO TABS: 5.0000 mg | ORAL_TABLET | Freq: Three times a day (TID) | ORAL | Status: DC | PRN

## 2016-02-16 NOTE — Telephone Encounter (Signed)
This RN spoke with pt post her visit with Research RN per pt's complaint of ongoing left breast pain. Pain is at surgical site- with description of " pulling, tight and tender ". No noted swelling-with areas of surgical site adhered well - no redness. Pain has been ongoing with pt stating she had a scan at [facility] recently.   She is using naproxen, gabapentin and flexeril for pain. Per discussion- including pt stating she would like to have port removed- recommendation per MD is to be evaluated by surgical MD ( Dr Marlou Starks ) for port removal and evaluation of surgical area.  This RN will forward this note and place an in box request for pt to be seen by Dr Marlou Starks per above.  Refill of flexeril given per request.

## 2016-02-16 NOTE — Progress Notes (Signed)
02/16/2016 Patient in to clinic today for study assessments prior to randomization. Upon arrival to clinic, patient had been fasting since 9:00pm yesterday. Random glucose sample was collected along with baseline research blood samples, including optional samples for the A011401-ST1 substudy. Height, weight and waist and hip circumference measurements were obtained per protocol. Patient is aware that her contact information has been shared with the [facility] and that she should expect to be contacted by the center after she has been randomized. Thanked patient for her participation. Cindy S. Brigitte Pulse BSN, RN, [place] 02/16/2016 11:34 AM

## 2016-02-18 ENCOUNTER — Other Ambulatory Visit: Payer: Self-pay | Admitting: Oncology

## 2016-02-20 ENCOUNTER — Telehealth: Payer: Self-pay | Admitting: *Deleted

## 2016-02-20 ENCOUNTER — Ambulatory Visit: Payer: BLUE CROSS/BLUE SHIELD | Admitting: Internal Medicine

## 2016-02-20 NOTE — Telephone Encounter (Signed)
Chart reviewed.

## 2016-02-20 NOTE — Telephone Encounter (Signed)
Spoke with patient by phone to notify her of [facility] [phone] randomization to the [employer]. Explained that patient will receive a magazine subscription, brochures and other mailings as part of this group, but will not receive coaching calls. Explained that clinic visits will continue with ongoing measurements, labwork and questionnaires. Patient is in agreement with this plan.  Also told patient that I will be mailing a copy of her signed consent and authorization forms for the DCP-001 study.  Patient states that she did begin reading information about the PALLAS study yesterday and will continue to review. She is interested in participating. Told patient that I will review her records for eligibility, to confirm that she qualifies for the study. Explained to patient that if she chooses to participate, she would need to be enrolled before 04/18/2016, which would likely mean rescheduling her October appointments with Dr. Jana Hakim to an date approximately one month earlier. Patient is in agreement with this plan.  Thanked patient for her participation in these studies. Cindy S. Brigitte Pulse BSN, RN, [place] 02/20/2016 11:25 AM

## 2016-02-23 ENCOUNTER — Other Ambulatory Visit: Payer: Self-pay | Admitting: *Deleted

## 2016-02-23 DIAGNOSIS — C50412 Malignant neoplasm of upper-outer quadrant of left female breast: Secondary | ICD-10-CM

## 2016-03-01 ENCOUNTER — Other Ambulatory Visit: Payer: BLUE CROSS/BLUE SHIELD

## 2016-03-01 ENCOUNTER — Encounter: Payer: BLUE CROSS/BLUE SHIELD | Admitting: *Deleted

## 2016-03-08 ENCOUNTER — Encounter: Payer: BLUE CROSS/BLUE SHIELD | Admitting: *Deleted

## 2016-03-08 ENCOUNTER — Other Ambulatory Visit (HOSPITAL_BASED_OUTPATIENT_CLINIC_OR_DEPARTMENT_OTHER): Payer: BLUE CROSS/BLUE SHIELD

## 2016-03-08 ENCOUNTER — Telehealth: Payer: Self-pay

## 2016-03-08 DIAGNOSIS — C50412 Malignant neoplasm of upper-outer quadrant of left female breast: Secondary | ICD-10-CM

## 2016-03-08 LAB — CBC WITH DIFFERENTIAL/PLATELET
BASO%: 0.5 % (ref 0.0–2.0)
Basophils Absolute: 0 10*3/uL (ref 0.0–0.1)
EOS%: 3.2 % (ref 0.0–7.0)
Eosinophils Absolute: 0.2 10*3/uL (ref 0.0–0.5)
HCT: 41 % (ref 34.8–46.6)
HGB: 13.3 g/dL (ref 11.6–15.9)
LYMPH%: 32.8 % (ref 14.0–49.7)
MCH: 28.9 pg (ref 25.1–34.0)
MCHC: 32.4 g/dL (ref 31.5–36.0)
MCV: 89.1 fL (ref 79.5–101.0)
MONO#: 0.7 10*3/uL (ref 0.1–0.9)
MONO%: 9.8 % (ref 0.0–14.0)
NEUT#: 4 10*3/uL (ref 1.5–6.5)
NEUT%: 53.7 % (ref 38.4–76.8)
Platelets: 214 10*3/uL (ref 145–400)
RBC: 4.6 10*6/uL (ref 3.70–5.45)
RDW: 14 % (ref 11.2–14.5)
WBC: 7.5 10*3/uL (ref 3.9–10.3)
lymph#: 2.5 10*3/uL (ref 0.9–3.3)

## 2016-03-08 LAB — COMPREHENSIVE METABOLIC PANEL
ALT: 24 U/L (ref 0–55)
AST: 17 U/L (ref 5–34)
Albumin: 3.7 g/dL (ref 3.5–5.0)
Alkaline Phosphatase: 97 U/L (ref 40–150)
Anion Gap: 11 mEq/L (ref 3–11)
BUN: 15.7 mg/dL (ref 7.0–26.0)
CO2: 26 mEq/L (ref 22–29)
Calcium: 9.8 mg/dL (ref 8.4–10.4)
Chloride: 106 mEq/L (ref 98–109)
Creatinine: 0.9 mg/dL (ref 0.6–1.1)
EGFR: 89 mL/min/{1.73_m2} — ABNORMAL LOW (ref >90)
Glucose: 122 mg/dl (ref 70–140)
Potassium: 3.3 mEq/L — ABNORMAL LOW (ref 3.5–5.1)
Sodium: 142 mEq/L (ref 136–145)
Total Bilirubin: <0.3 mg/dL (ref 0.20–1.20)
Total Protein: 8.2 g/dL (ref 6.4–8.3)

## 2016-03-08 LAB — RESEARCH LABS

## 2016-03-08 NOTE — Telephone Encounter (Signed)
Pt came to office stating she needed a letter for return to work. The pt dictated restrictions of no lifting, no standing more than 10 minutes, needs to be able to sit down as she needs, no more than 20 hours / week. States she needs it today for orientation at work tomorrow. Explained to pt that Dr Jana Hakim and Tivis Ringer RN are out of office until Monday. There is no mention of work status in Dr [employer] notes. She states she will inform work she cannot do orientation on Friday. The pt also had a work status form that she left, this form was placed in Raquel's box. The pt will call on Monday.

## 2016-03-08 NOTE — Progress Notes (Signed)
03/08/2016 See AFT-05 PALLAS study Research Consent form encounter for details related to today's visit. Cindy S. Brigitte Pulse BSN, RN, [place] 03/08/2016 2:00 PM

## 2016-03-09 LAB — HEMOGLOBIN A1C
Est. average glucose Bld gHb Est-mCnc: 148 mg/dL
Hemoglobin A1c: 6.8 % — ABNORMAL HIGH (ref 4.8–5.6)

## 2016-03-12 ENCOUNTER — Telehealth: Payer: Self-pay | Admitting: Oncology

## 2016-03-12 ENCOUNTER — Encounter: Payer: Self-pay | Admitting: *Deleted

## 2016-03-12 ENCOUNTER — Encounter: Payer: Self-pay | Admitting: Oncology

## 2016-03-12 NOTE — Telephone Encounter (Signed)
spoke w/ pt confirmed 8/17 apt times & 8/24 apt times

## 2016-03-12 NOTE — Progress Notes (Signed)
left in box. I left for dr. Jana Hakim to sign

## 2016-03-14 ENCOUNTER — Encounter: Payer: Self-pay | Admitting: Oncology

## 2016-03-14 NOTE — Progress Notes (Signed)
left in box. I left for dr. Jana Hakim to sign- mailed to patient and sent to medical recds

## 2016-03-16 ENCOUNTER — Other Ambulatory Visit: Payer: Self-pay | Admitting: *Deleted

## 2016-03-21 ENCOUNTER — Other Ambulatory Visit: Payer: Self-pay | Admitting: Oncology

## 2016-03-21 DIAGNOSIS — C50412 Malignant neoplasm of upper-outer quadrant of left female breast: Secondary | ICD-10-CM

## 2016-03-22 ENCOUNTER — Telehealth: Payer: Self-pay | Admitting: Oncology

## 2016-03-22 ENCOUNTER — Ambulatory Visit (HOSPITAL_BASED_OUTPATIENT_CLINIC_OR_DEPARTMENT_OTHER): Payer: BLUE CROSS/BLUE SHIELD | Admitting: Oncology

## 2016-03-22 ENCOUNTER — Other Ambulatory Visit: Payer: Self-pay | Admitting: *Deleted

## 2016-03-22 ENCOUNTER — Other Ambulatory Visit: Payer: Self-pay | Admitting: General Surgery

## 2016-03-22 ENCOUNTER — Encounter: Payer: BLUE CROSS/BLUE SHIELD | Admitting: *Deleted

## 2016-03-22 VITALS — BP 141/65 | HR 96 | Temp 98.9°F | Resp 18 | Ht 68.25 in | Wt 295.6 lb

## 2016-03-22 DIAGNOSIS — Z170 Estrogen receptor positive status [ER+]: Secondary | ICD-10-CM

## 2016-03-22 DIAGNOSIS — C50412 Malignant neoplasm of upper-outer quadrant of left female breast: Secondary | ICD-10-CM

## 2016-03-22 DIAGNOSIS — C50211 Malignant neoplasm of upper-inner quadrant of right female breast: Secondary | ICD-10-CM

## 2016-03-22 DIAGNOSIS — N951 Menopausal and female climacteric states: Secondary | ICD-10-CM

## 2016-03-22 DIAGNOSIS — C50812 Malignant neoplasm of overlapping sites of left female breast: Secondary | ICD-10-CM | POA: Diagnosis not present

## 2016-03-22 DIAGNOSIS — I10 Essential (primary) hypertension: Secondary | ICD-10-CM

## 2016-03-22 DIAGNOSIS — I890 Lymphedema, not elsewhere classified: Secondary | ICD-10-CM

## 2016-03-22 DIAGNOSIS — Z79811 Long term (current) use of aromatase inhibitors: Secondary | ICD-10-CM

## 2016-03-22 DIAGNOSIS — C50411 Malignant neoplasm of upper-outer quadrant of right female breast: Secondary | ICD-10-CM

## 2016-03-22 DIAGNOSIS — G629 Polyneuropathy, unspecified: Secondary | ICD-10-CM

## 2016-03-22 MED ORDER — VENLAFAXINE HCL ER 150 MG PO CP24: 150.0000 mg | ORAL_CAPSULE | Freq: Every day | ORAL | 6 refills | Status: DC

## 2016-03-22 MED ORDER — VALACYCLOVIR HCL 500 MG PO TABS: 500.0000 mg | ORAL_TABLET | Freq: Every day | ORAL | 1 refills | Status: DC

## 2016-03-22 MED ORDER — LORAZEPAM 0.5 MG PO TABS: 0.5000 mg | ORAL_TABLET | Freq: Two times a day (BID) | ORAL | 0 refills | Status: DC | PRN

## 2016-03-22 MED ORDER — FLUCONAZOLE 100 MG PO TABS: 100.0000 mg | ORAL_TABLET | Freq: Every day | ORAL | 0 refills | Status: DC

## 2016-03-22 NOTE — Progress Notes (Signed)
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 She has been receiving medical care through the emergency room and was seen in March 2015 following a head injury, then in August 2015 for lancing of an abscess. In December 2015 she noticed that her left breast looked a little bit smaller than her right. She brought this to the attention of friends and family over the next several months but the general feeling was that most people are not perfectly symmetrical. By the summer of this year she noticed that her nipple on the left was "going in". More recently she started developing pain in the left breast. She was evaluated for this in the emergency room 04/09/2015 at which time a left breast exam showed a deformed left breast with a large hard mass encompassing most of the breast, with nipple retraction. There was no nipple discharge or bleeding and no palpable adenopathy.  The patient was referred to [facility] and on 04/13/2015 she underwent bilateral diagnostic mammography with tomosynthesis as well as bilateral breast ultrasound. The breast density was category B. In the right breast at the 11:00  position there was a 1.3 cm irregular mass which by ultrasonography measured 1.3 cm.--In the left breast there was a 4 cm irregular mass at the 2:00 position associated with nipple retraction. There was a second, 1.7 cm area of architectural distortion at the 9:00 position. Both were palpable. By ultrasonography, the 4 cm irregular mass was noted, with a second mass measuring 2.5 cm by ultrasonography. Both axillae were benign.  On 04/19/2015 the patient underwent right breast upper outer quadrant biopsy, showing ([id number]) and invasive ductal carcinoma, grade 1, estrogen receptor 90% positive, progesterone receptor negative, with an MIB-1 of 5%, and no HER-2 amplification, the signals ratio being 1.26 and the number per cell 2.20.  On the same day, she underwent biopsy of the 2 left breast masses in question as well as a suspicious left axillary lymph node. All 3 biopsies showed invasive ductal carcinoma, grade 2, estrogen receptor 80-100% positive, progesterone receptor 80-100% positive, with the MIB-1 between 5 and 10%, and no HER-2 amplification, the signals ratio being between 1.05 and 1.13, and the number per cell between 2.10 and 2.25.  The patient's subsequent history is as detailed below  INTERVAL HISTORY: Margaret [name] returns today for follow-up of her locally advanced estrogen receptor positive breast cancers. She continues on anastrozole. She does have hot flashes from that medication. She is on gabapentin and venlafaxine for this. Since her last visit also she has enrolled in the PALLAS trial. She has not yet been randomized  REVIEW OF SYSTEMS: Margaret [name] has some left upper extremity lymphedema. She is wearing a sleeve and has a lymphedema machine which she is using appropriately. She has insomnia problems. She has  aches and pains related to her surgery which are expected at this time. There is no suggestion of intercurrent infection, bleeding, or other complication. She feels anxious but not  depressed. She has had mild thrush which is now resolved through the use of Diflucan. A detailed review of systems today was otherwise stable.  PAST MEDICAL HISTORY: Past Medical History:  Diagnosis Date  . Bilateral breast cancer ([place])   . Breast cancer ([place])   . Breast cancer of upper-outer quadrant of left female breast ([place]) 04/21/2015  . GERD (gastroesophageal reflux disease)   . Hypertension     PAST SURGICAL HISTORY: Past Surgical History:  Procedure Laterality Date  . ABDOMINAL HYSTERECTOMY  1990  . MASTECTOMY MODIFIED RADICAL Left 05/09/2015  . MASTECTOMY MODIFIED RADICAL Left 05/09/2015   Procedure: LEFT MODIFIED RADICAL MASTETCTOMY;  Surgeon: Autumn Messing III, MD;  Location: [place] [facility];  Service: General;  Laterality: Left;  Marland Kitchen MASTECTOMY W/ SENTINEL NODE BIOPSY Right   . MASTECTOMY W/ SENTINEL NODE BIOPSY Right 05/09/2015   Procedure: RIGHT MASTECTOMY WITH RIGHT AXILLARY SENTINEL LYMPH NODE BIOPSY;  Surgeon: Autumn Messing III, MD;  Location: [facility];  Service: General;  Laterality: Right;  . PORTACATH PLACEMENT Right 09/01/2015   Procedure: INSERTION PORT-A-CATH;  Surgeon: Autumn Messing III, MD;  Location: [facility];  Service: General;  Laterality: Right;  . TUBAL LIGATION  1984    FAMILY HISTORY Family History  Problem Relation Age of Onset  . Hypertension Mother   . Diabetes Mother     the patient has little information on her father. Her mother is living, currently age 20. She has one brother, 2 sisters. There is no history of breast or ovarian cancer in the family to her knowledge.   GYNECOLOGIC HISTORY:  No LMP recorded. Patient has had a hysterectomy.  menarche age 63, first live birth age 69. She is GX P4. She underwent a total abdominal hysterectomy with bilateral salpingo-oophorectomy at age 43. She did not take hormone replacement. She never used oral contraceptives.   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  She is not employed by [employer] but by a Set designer. She is single and lives with her significant other [name]. He is status post renal transplant. The patient's daughter  [name] works as a Programmer, applications, as does her daughter  [name]. Daughter [name] works at [employer], as a call. All 3 live in [place]. The patient's son Marc Morgans Mura's lives in [place]. He prepares sets for shows The patient has 4 grandchildren, no great grandchildren. She attends a [employer] in [place].     ADVANCED DIRECTIVES: Not in place. At the initial clinic visit the patient was given the appropriate forms to complete and notarize at her discretion. The patient intends to name her daughter Sampson Si as healthcare power of attorney. She can be reached at [phone].  HEALTH MAINTENANCE: Social History  Substance Use Topics  . Smoking status: Former Smoker    Packs/day: 0.10    Years: 28.00  . Smokeless tobacco: Never Used  . Alcohol use No     Colonoscopy: Never  PAP: Status post hysterectomy  Bone density: Never  Lipid panel:  Allergies  Allergen Reactions  . Latex Itching and Other (See Comments)    burning  . 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 Wheat  Bran Hives    Current Outpatient Prescriptions  Medication Sig Dispense Refill  . acetaminophen (TYLENOL) 500 MG tablet Take 1,000 mg by mouth every 6 (six) hours as needed for mild pain. Reported on 11/28/2015    . anastrozole (ARIMIDEX) 1 MG tablet Take 1 tablet (1 mg total) by mouth daily. 90 tablet 4  . cyclobenzaprine (FLEXERIL) 5 MG tablet Take 1 tablet (5 mg total) by mouth 3 (three) times daily as needed for muscle spasms. 30 tablet 60  . gabapentin (NEURONTIN) 300 MG capsule Take 2 capsules (600 mg total) by mouth 3 (three) times daily. 180 capsule 0  . hydrochlorothiazide (HYDRODIURIL) 12.5 MG tablet Take 1 tablet (12.5 mg total) by mouth daily.  30 tablet 3  . LORazepam (ATIVAN) 0.5 MG tablet Take 1 tablet (0.5 mg total) by mouth 2 (two) times daily as needed for anxiety. 60 tablet 0  . losartan (COZAAR) 50 MG tablet Take 1 tablet (50 mg total) by mouth daily. 30 tablet 1  . naproxen (NAPROSYN) 500 MG tablet TAKE 1 TABLET BY MOUTH TWICE A DAY WITH A MEAL 60 tablet 1  . pantoprazole (PROTONIX) 40 MG tablet TAKE 1 TABLET (40 MG TOTAL) BY MOUTH DAILY. 30 tablet 1  . valACYclovir (VALTREX) 500 MG tablet Take 1 tablet (500 mg total) by mouth daily. 30 tablet 1  . venlafaxine XR (EFFEXOR-XR) 150 MG 24 hr capsule Take 1 capsule (150 mg total) by mouth daily with breakfast. 30 capsule 6   No current facility-administered medications for this visit.       OBJECTIVE: Middle-aged African-American woman in no acute distress Vitals:   03/22/16 1448 03/22/16 1456  BP: (!) 153/106 (!) 141/65  Pulse:    Resp:    Temp:       Body mass index is 44.62 kg/m.    ECOG FS:1 - Symptomatic but completely ambulatory   Sclerae are slightly injected but unicteric, EOMs intact Oropharynx clear, no sores or thrush noted No cervical or supraclavicular adenopathy Lungs no rales or rhonchi Heart regular rate and rhythm Abd soft, nontender, positive bowel sounds MSK no focal spinal tenderness, no upper extremity lymphedema Neuro: nonfocal, well oriented, appropriate affect Breasts: Status post bilateral mastectomies. There is no evidence of local recurrence. Both axillae are benign.     LAB RESULTS:  CMP     Component Value Date/Time   NA 142 03/08/2016 1148   K 3.3 (L) 03/08/2016 1148   CL 102 06/29/2015 0923   CO2 26 03/08/2016 1148   GLUCOSE 122 03/08/2016 1148   BUN 15.7 03/08/2016 1148   CREATININE 0.9 03/08/2016 1148   CALCIUM 9.8 03/08/2016 1148   PROT 8.2 03/08/2016 1148   ALBUMIN 3.7 03/08/2016 1148   AST 17 03/08/2016 1148   ALT 24 03/08/2016 1148   ALKPHOS 97 03/08/2016 1148   BILITOT <0.30 03/08/2016 1148   GFRNONAA >60  06/29/2015 0923   GFRAA >60 06/29/2015 0923    INo results found for: SPEP, UPEP  Lab Results  Component Value Date   WBC 7.5 03/08/2016   NEUTROABS 4.0 03/08/2016   HGB 13.3 03/08/2016   HCT 41.0 03/08/2016   MCV 89.1 03/08/2016   PLT 214 03/08/2016      Chemistry      Component Value Date/Time   NA 142 03/08/2016 1148   K 3.3 (L) 03/08/2016 1148   CL 102 06/29/2015 0923   CO2 26 03/08/2016 1148   BUN 15.7 03/08/2016 1148   CREATININE 0.9  03/08/2016 1148      Component Value Date/Time   CALCIUM 9.8 03/08/2016 1148   ALKPHOS 97 03/08/2016 1148   AST 17 03/08/2016 1148   ALT 24 03/08/2016 1148   BILITOT <0.30 03/08/2016 1148       No results found for: LABCA2  No components found for: LABCA125  No results for input(s): INR in the last 168 hours.  Urinalysis    Component Value Date/Time   COLORURINE YELLOW 06/29/2015 1030   APPEARANCEUR CLOUDY (A) 06/29/2015 1030   LABSPEC 1.019 06/29/2015 1030   PHURINE 7.0 06/29/2015 1030   GLUCOSEU NEGATIVE 06/29/2015 1030   HGBUR TRACE (A) 06/29/2015 1030   BILIRUBINUR NEGATIVE 06/29/2015 1030   KETONESUR NEGATIVE 06/29/2015 1030   PROTEINUR NEGATIVE 06/29/2015 1030   NITRITE NEGATIVE 06/29/2015 1030   LEUKOCYTESUR MODERATE (A) 06/29/2015 1030    STUDIES: No results found.   ASSESSMENT: 55 y.o. [place] woman with synchronous breast cancers, as follows  (a) status post right breast upper outer quadrant biopsy 04/19/2015 for a clinical pT1c N0, stage IA invasive ductal carcinoma, grade 1, estrogen receptor positive, progesterone receptor negative, with an MIB-1 of 5%, and no HER-2 amplification  (b) status post left breast upper outer quadrant biopsy 2 and left axillary lymph node biopsy 04/19/2015 for a clinical T3 N1, stage IIIA invasive ductal carcinoma, grade 1 or 2, estrogen receptor and progesterone receptor positive, HER-2 negative, with an MIB-1 between 5 and 10%  (1) status post bilateral mastectomies  05/09/2015, showing:   (a) on the right side, a pT2 pN0, stage IIA invasive ductal carcinoma, with negative margins and repeat HER-2 again negative    (b) on the left, a pT3 pN2, stage IIIA invasive ductal carcinoma, grade 2, with negative margins, and repeat HER-2 again negative  (2) Mammaprint from the left-sided tumor returned "luminal type, low risk", predicting a small benefit from chemotherapy with the important caveat that N2 disease was not included in the MINDACT study--patient opted for chemotherapy  (3) Oncotype from the right-sided tumor showed a score of 12, predicting an 8% risk of recurrence outside the breast within the next 10 years, if the patient's only systemic therapy was tamoxifen for 5 years. It also predicted no significant benefit from chemotherapy  (4) postmastectomy radiation to the left chest wall completed 08/15/2015  (5) adjuvant chemotherapy consisting of cyclophosphamide and docetaxel x4 completed on 11/21/15.  (5) anastrozole started 02/08/2016  (6) left upper extremity lymphedema  (7) signed for B-WELL study 02/08/2016  PLAN: Margaret [name] is tolerating the anastrozole well. She is already on venlafaxine and gabapentin for the hot flashes and I can only hope things will gradually improve. I did increase her venlafaxine today to 150 mg daily. Also we can consider adding a TTS-1 patch when she returns to see me next month.  She has enrolled on the PALLAS trial and will be randomized next week. I am hopeful she will receive the active drug. She understands there may be some fatigue and some low counts as well as of course other possible side effects but most of my patients to take this drug tolerated quite well. We will be following her counts on a weekly basis initially but eventually only once a month   She has some grade 1 peripheral neuropathy and grade 1 left upper extremity lymphedema, which are going to be chronic. She has no residual side effects from the  neoadjuvant chemotherapy, radiation or her surgeries. She has no uncontrolled intercurrent illness that would  limit compliance with her study requirements and she is very motivated to proceed.  Her blood pressure today was a little bit on the high side initially but after she relaxed some it went down to more acceptable levels.   I am referring her back to Dr. Marlou Starks for port removal.  She will see me again in one month. I have encouraged her to call for any problems that may develop before that visit.     Chauncey Cruel, MD   03/22/2016 3:25 PM

## 2016-03-22 NOTE — Telephone Encounter (Signed)
appt made and avs printed °

## 2016-03-22 NOTE — Progress Notes (Signed)
03/22/2016 See consent form encounter for details regarding today's screening visit and randomization. Patient will return to clinic on 03/29/2016 for C1D1 visit to include questionnaires, lab sample collection (CBC, CMET and research blood samples), study medication and diaries. Patient is aware of upcoming visit at start of subsequent cycle, five weeks from today, with interim lab appointments to check blood counts at [facility] and C2D14. Patient reminded to notify study doctor of any changes to her medications, prior to taking them, to check for any potential interactions.   Adverse Event Log Study/Protocol: AFT-05 PALLAS study Cycle: Baseline AEs  Event Grade Onset Date Treatment Comments  Nail changes (darkening) 1 12/05/2015  Began a couple of weeks after chemo ended  Joint pain 1 03/09/2016  Knee pain  Hot flashes 2 02/27/2016 Increased dose of Effexor, gabapentin Previously mild; moderate in nature after starting AI  Headaches, intermittent 2 10/12/2013    Mouth sores, intermittent 2 09/18/2105 Diflucan, valacyclovir   Insomnia 1 04/27/2015    Anxiety 2 08/19/2015 Ativan, effexor   Chest wall pain 2 05/09/2015 Gabapentin Naproxen   Peripheral neuropathy 1 01/31/2016  Tingling & numbness in toes and fingertips  Bilateral upper extremity edema 1 10/26/2015    Hypertension 2 09/15/2014 HCTZ, Cozaar   Cindy S. Brigitte Pulse BSN, RN, [place] 03/23/2016 12:47 PM

## 2016-03-25 ENCOUNTER — Encounter: Payer: Self-pay | Admitting: Oncology

## 2016-03-29 ENCOUNTER — Encounter: Payer: BLUE CROSS/BLUE SHIELD | Admitting: *Deleted

## 2016-03-29 ENCOUNTER — Other Ambulatory Visit: Payer: BLUE CROSS/BLUE SHIELD

## 2016-03-29 DIAGNOSIS — C50412 Malignant neoplasm of upper-outer quadrant of left female breast: Secondary | ICD-10-CM

## 2016-03-29 DIAGNOSIS — C50211 Malignant neoplasm of upper-inner quadrant of right female breast: Secondary | ICD-10-CM

## 2016-03-29 LAB — RESEARCH LABS

## 2016-03-29 MED ORDER — INV-PALBOCICLIB 125 MG CAPS #23 ALLIANCE FOUNDATION AFT-05 (PALLAS): 125.0000 mg | ORAL_CAPSULE | Freq: Every day | ORAL | 0 refills | Status: DC

## 2016-03-29 NOTE — Progress Notes (Signed)
03/29/2016 Patient in to clinic today for [place] visit. Upon arrival to clinic, PRO questionnaires were completed independently by the patient. Patient then proceeded to lab for collection of C1D1 research blood samples. Study medication bottle with Palbociclib 125 mg capsules, kit #[id number], was dispensed by pharmacist Henreitta Leber, and was reviewed with the patient, noting that medication is taken for the first 21 days of the 4 week cycle, beginning today, and should be taken at approximately the same time each day with food. Patient understands that anastrozole is taken daily without interruption. She reports that she currently takes her anastrozole in the evening, and patient was informed that it was okay to take study medication also in the evening, however, it should be taken with food. Patient was given diaries for recording anti-hormone therapy (anastrozole) and palbociclib doses, noting the time and number of pills taken, and instructions for both medications were reviewed with her. Patient is aware that she should avoid grapefruit and grapefruit juice during the 2-year study period to avoid medication interactions. Instructed patient to return her study medication bottles at her next cycle visit in four weeks, including any unused capsules, if any are remaining. Patient is aware that she will return to the clinic in two weeks for lab tests, and that she will make visits to the clinic every two weeks for the first eight weeks on the study. Patient was instructed to notify the office of any questions or concerns, or if new symptoms develop. Cindy S. Brigitte Pulse BSN, RN, [place] 03/29/2016 5:05 PM

## 2016-03-30 ENCOUNTER — Telehealth: Payer: Self-pay

## 2016-03-30 ENCOUNTER — Other Ambulatory Visit: Payer: Self-pay | Admitting: *Deleted

## 2016-03-30 DIAGNOSIS — C50412 Malignant neoplasm of upper-outer quadrant of left female breast: Secondary | ICD-10-CM

## 2016-03-30 NOTE — Telephone Encounter (Signed)
Spoke with the patient this morning regarding lab appointment needed for today or on Monday 04/02/16.  Appointment made for 04/02/16 but if she is in the area today she will let us know. Webb Silversmith

## 2016-04-02 ENCOUNTER — Other Ambulatory Visit: Payer: BLUE CROSS/BLUE SHIELD

## 2016-04-02 ENCOUNTER — Telehealth: Payer: Self-pay | Admitting: *Deleted

## 2016-04-02 NOTE — Telephone Encounter (Signed)
04/02/2016 Following C1D1 appointment on 03/29/2016, it was discovered, due to a lab error, that chemistry and hematology tests were not collected (required due to >7 day window since screening labs were performed on 03/08/2016). Patient was contacted Friday morning, 03/30/2016, when the error was discovered, however, patient was unable to come to clinic on Friday, but said she would make arrangements to come on Monday at 2:45pm. Attempted to contact patient by phone due to missed appointment today, however, there was no answer. Voice mail message left for patient regarding missed appointment, noting that it was understood if she could not make it in for the missed labs, which was due to an error on our part. Reiterated that patient does need to be sure to come to clinic on Thursday, September 7th for Day 14 lab collection, as it is imperative that blood counts be monitored closely during initial therapy. Instructed patient to contact clinic at least one day in advance, if the appointment needs to be rescheduled, as we have a limited window in which to schedule the appointment. Left my direct call back number [phone] for any further questions or concerns. Cindy S. Brigitte Pulse BSN, RN, [place] 04/02/2016 4:09 PM

## 2016-04-05 ENCOUNTER — Other Ambulatory Visit: Payer: Self-pay | Admitting: *Deleted

## 2016-04-05 DIAGNOSIS — C50412 Malignant neoplasm of upper-outer quadrant of left female breast: Secondary | ICD-10-CM

## 2016-04-10 ENCOUNTER — Telehealth: Payer: Self-pay

## 2016-04-12 ENCOUNTER — Ambulatory Visit (HOSPITAL_BASED_OUTPATIENT_CLINIC_OR_DEPARTMENT_OTHER): Payer: BLUE CROSS/BLUE SHIELD

## 2016-04-12 ENCOUNTER — Encounter: Payer: Self-pay | Admitting: *Deleted

## 2016-04-12 ENCOUNTER — Other Ambulatory Visit: Payer: Self-pay | Admitting: Oncology

## 2016-04-12 DIAGNOSIS — C50211 Malignant neoplasm of upper-inner quadrant of right female breast: Secondary | ICD-10-CM

## 2016-04-12 DIAGNOSIS — C50412 Malignant neoplasm of upper-outer quadrant of left female breast: Secondary | ICD-10-CM

## 2016-04-12 DIAGNOSIS — Z006 Encounter for examination for normal comparison and control in clinical research program: Secondary | ICD-10-CM

## 2016-04-12 DIAGNOSIS — C50812 Malignant neoplasm of overlapping sites of left female breast: Secondary | ICD-10-CM

## 2016-04-12 LAB — CBC WITH DIFFERENTIAL/PLATELET
BASO%: 0.9 % (ref 0.0–2.0)
Basophils Absolute: 0 10*3/uL (ref 0.0–0.1)
EOS%: 2.9 % (ref 0.0–7.0)
Eosinophils Absolute: 0.1 10*3/uL (ref 0.0–0.5)
HCT: 39.2 % (ref 34.8–46.6)
HGB: 12.6 g/dL (ref 11.6–15.9)
LYMPH%: 33.3 % (ref 14.0–49.7)
MCH: 28.1 pg (ref 25.1–34.0)
MCHC: 32.1 g/dL (ref 31.5–36.0)
MCV: 87.4 fL (ref 79.5–101.0)
MONO#: 0.3 10*3/uL (ref 0.1–0.9)
MONO%: 6.4 % (ref 0.0–14.0)
NEUT#: 2.6 10*3/uL (ref 1.5–6.5)
NEUT%: 56.5 % (ref 38.4–76.8)
Platelets: 239 10*3/uL (ref 145–400)
RBC: 4.48 10*6/uL (ref 3.70–5.45)
RDW: 15.3 % — ABNORMAL HIGH (ref 11.2–14.5)
WBC: 4.5 10*3/uL (ref 3.9–10.3)
lymph#: 1.5 10*3/uL (ref 0.9–3.3)

## 2016-04-12 LAB — COMPREHENSIVE METABOLIC PANEL
ALT: 22 U/L (ref 0–55)
AST: 20 U/L (ref 5–34)
Albumin: 3.5 g/dL (ref 3.5–5.0)
Alkaline Phosphatase: 113 U/L (ref 40–150)
Anion Gap: 11 mEq/L (ref 3–11)
BUN: 16.7 mg/dL (ref 7.0–26.0)
CO2: 26 mEq/L (ref 22–29)
Calcium: 9.4 mg/dL (ref 8.4–10.4)
Chloride: 106 mEq/L (ref 98–109)
Creatinine: 1.1 mg/dL (ref 0.6–1.1)
EGFR: 63 mL/min/{1.73_m2} — ABNORMAL LOW (ref >90)
Glucose: 164 mg/dl — ABNORMAL HIGH (ref 70–140)
Potassium: 3.4 mEq/L — ABNORMAL LOW (ref 3.5–5.1)
Sodium: 142 mEq/L (ref 136–145)
Total Bilirubin: <0.3 mg/dL (ref 0.20–1.20)
Total Protein: 8 g/dL (ref 6.4–8.3)

## 2016-04-12 NOTE — Progress Notes (Signed)
04/12/2016 Patient in to clinic today for Day 14 lab tests (scheduled on Day 15, to accommodate patient's work and transportation schedule). See separate consent form encounter regarding study re-consent. Patient has noted some mild dry skin and itching on arms, and has also noted some joint pain, primarily in her fingers, which is mild in nature. These symptoms began about one week ago. Otherwise, she has no new problems to report. Patient then proceeded to lab for collection of Day 15 samples (CBC, and additional CMET per Dr. Virgie Dad request). Confirmed patient's 04/26/16 appointments, noting that she will see the MD at that appointment, for assessment regarding continued treatment. Left voice mail message for patient on mobile phone number, after her lab results were received and found to be within parameters for continued treatment. Instructed patient to continue taking palbociclib and anastrozole as instructed. Cindy S. Brigitte Pulse BSN, RN, [place] 04/12/2016 2:40 PM

## 2016-04-13 MED ORDER — VENLAFAXINE HCL ER 150 MG PO CP24: 150.0000 mg | ORAL_CAPSULE | Freq: Every day | ORAL | 6 refills | Status: DC

## 2016-04-13 NOTE — Telephone Encounter (Signed)
S/w pt and she completed her bottle of 75 mg effexor. Pharmacy did not have rx for 150 mg capsules. Phoned in order from Dr Magrinat's entry. To [facility]

## 2016-04-16 ENCOUNTER — Other Ambulatory Visit: Payer: Self-pay | Admitting: Oncology

## 2016-04-26 ENCOUNTER — Encounter: Payer: Self-pay | Admitting: Medical Oncology

## 2016-04-26 ENCOUNTER — Ambulatory Visit (HOSPITAL_BASED_OUTPATIENT_CLINIC_OR_DEPARTMENT_OTHER): Payer: BLUE CROSS/BLUE SHIELD | Admitting: Oncology

## 2016-04-26 ENCOUNTER — Other Ambulatory Visit (HOSPITAL_BASED_OUTPATIENT_CLINIC_OR_DEPARTMENT_OTHER): Payer: BLUE CROSS/BLUE SHIELD

## 2016-04-26 DIAGNOSIS — M25531 Pain in right wrist: Secondary | ICD-10-CM

## 2016-04-26 DIAGNOSIS — C50411 Malignant neoplasm of upper-outer quadrant of right female breast: Secondary | ICD-10-CM

## 2016-04-26 DIAGNOSIS — Z006 Encounter for examination for normal comparison and control in clinical research program: Secondary | ICD-10-CM

## 2016-04-26 DIAGNOSIS — C50812 Malignant neoplasm of overlapping sites of left female breast: Secondary | ICD-10-CM | POA: Diagnosis not present

## 2016-04-26 DIAGNOSIS — C50412 Malignant neoplasm of upper-outer quadrant of left female breast: Secondary | ICD-10-CM

## 2016-04-26 DIAGNOSIS — M79661 Pain in right lower leg: Secondary | ICD-10-CM

## 2016-04-26 DIAGNOSIS — Z79811 Long term (current) use of aromatase inhibitors: Secondary | ICD-10-CM

## 2016-04-26 DIAGNOSIS — G629 Polyneuropathy, unspecified: Secondary | ICD-10-CM

## 2016-04-26 DIAGNOSIS — F411 Generalized anxiety disorder: Secondary | ICD-10-CM

## 2016-04-26 DIAGNOSIS — C50912 Malignant neoplasm of unspecified site of left female breast: Principal | ICD-10-CM

## 2016-04-26 DIAGNOSIS — M79662 Pain in left lower leg: Secondary | ICD-10-CM

## 2016-04-26 DIAGNOSIS — C50211 Malignant neoplasm of upper-inner quadrant of right female breast: Secondary | ICD-10-CM

## 2016-04-26 DIAGNOSIS — C50911 Malignant neoplasm of unspecified site of right female breast: Secondary | ICD-10-CM

## 2016-04-26 DIAGNOSIS — R200 Anesthesia of skin: Secondary | ICD-10-CM

## 2016-04-26 LAB — COMPREHENSIVE METABOLIC PANEL
ALT: 27 U/L (ref 0–55)
AST: 24 U/L (ref 5–34)
Albumin: 3.6 g/dL (ref 3.5–5.0)
Alkaline Phosphatase: 129 U/L (ref 40–150)
Anion Gap: 11 mEq/L (ref 3–11)
BUN: 16 mg/dL (ref 7.0–26.0)
CO2: 26 mEq/L (ref 22–29)
Calcium: 9.7 mg/dL (ref 8.4–10.4)
Chloride: 105 mEq/L (ref 98–109)
Creatinine: 1.2 mg/dL — ABNORMAL HIGH (ref 0.6–1.1)
EGFR: 61 mL/min/{1.73_m2} — ABNORMAL LOW (ref >90)
Glucose: 123 mg/dl (ref 70–140)
Potassium: 3.3 mEq/L — ABNORMAL LOW (ref 3.5–5.1)
Sodium: 142 mEq/L (ref 136–145)
Total Bilirubin: <0.3 mg/dL (ref 0.20–1.20)
Total Protein: 8.1 g/dL (ref 6.4–8.3)

## 2016-04-26 LAB — CBC WITH DIFFERENTIAL/PLATELET
BASO%: 1.3 % (ref 0.0–2.0)
Basophils Absolute: 0.1 10*3/uL (ref 0.0–0.1)
EOS%: 1.7 % (ref 0.0–7.0)
Eosinophils Absolute: 0.1 10*3/uL (ref 0.0–0.5)
HCT: 38.5 % (ref 34.8–46.6)
HGB: 12.9 g/dL (ref 11.6–15.9)
LYMPH%: 38.4 % (ref 14.0–49.7)
MCH: 29.5 pg (ref 25.1–34.0)
MCHC: 33.5 g/dL (ref 31.5–36.0)
MCV: 88.1 fL (ref 79.5–101.0)
MONO#: 0.7 10*3/uL (ref 0.1–0.9)
MONO%: 13.4 % (ref 0.0–14.0)
NEUT#: 2.4 10*3/uL (ref 1.5–6.5)
NEUT%: 45.2 % (ref 38.4–76.8)
Platelets: 234 10*3/uL (ref 145–400)
RBC: 4.37 10*6/uL (ref 3.70–5.45)
RDW: 16.9 % — ABNORMAL HIGH (ref 11.2–14.5)
WBC: 5.3 10*3/uL (ref 3.9–10.3)
lymph#: 2 10*3/uL (ref 0.9–3.3)

## 2016-04-26 MED ORDER — LORAZEPAM 0.5 MG PO TABS: 0.5000 mg | ORAL_TABLET | Freq: Two times a day (BID) | ORAL | 0 refills | Status: DC | PRN

## 2016-04-26 MED ORDER — GABAPENTIN 300 MG PO CAPS: 600.0000 mg | ORAL_CAPSULE | Freq: Three times a day (TID) | ORAL | 0 refills | Status: DC

## 2016-04-26 MED ORDER — IBUPROFEN 800 MG PO TABS: 800.0000 mg | ORAL_TABLET | Freq: Three times a day (TID) | ORAL | 0 refills | Status: DC | PRN

## 2016-04-26 MED ORDER — VENLAFAXINE HCL ER 150 MG PO CP24: 150.0000 mg | ORAL_CAPSULE | Freq: Every day | ORAL | 6 refills | Status: DC

## 2016-04-26 MED ORDER — ANASTROZOLE 1 MG PO TABS: 1.0000 mg | ORAL_TABLET | Freq: Every day | ORAL | 4 refills | Status: DC

## 2016-04-26 NOTE — Progress Notes (Signed)
PALLAS: Cycle 2/ Day 1 I met with patient today for Tyrell Antonio, Research Nurse, prior to patient's lab appointment and introduced myself. Patient here with her significant other, for cycle 2/day 1 labs and MD assessment. PRO's completed. I inquired with patient if she had her cycle 1 study dispensed bottle and medication diaries, patient states she forgot to bring them with her. I asked patient if she could bring them with her at her cycle 2, day 14 lab appointment, patient stated she would. Patient does report no missed days and no difficulty with self administration of study drug palbociclib. Patient denies having any nausea, vomiting, fever or bleeding. Patient does report to be having an overall "achey" feeling, mostly in her hips and quads and describes it as "flu-like." Patient reports fatigue is worse than last visit, states started approximately at the end of cycle 1, around the 14th of September, and does confirm that this has not interfered in her daily activities and states "today is a good day, yesterday was not so good." Patient also reporting some numbness to right hand that occurs in the morning only, usually upon waking up, in which Dr. Jana Hakim feels is due to carpal tunnel. Patient reports no other symptoms, concerns or issues. Concomitant medications reviewed and per patient, she is not taking any new medications. Per Dr. Virgie Dad assessment of patient, review of all labs and NCS labs (which are within continuation of treatment parameters), patient cleared to start Cycle 2 of palbociclib 125 mg. Patient and I agreed that she would wait for me in the lobby while I retrieved study dispensed palbociclib from Pharmacy, upon my return, patient was not to be found. I called patient on her cell phone number and patient states she had forgotten to wait for me and left. Patient stated she could not return today, and that she will return tomorrow at 10 am to pick up study drug and calendars. PharmD,  Romualdo Bolk dispensed study drug bottle #[id number] Adele Dan, RN, BSN Clinical Research 04/26/2016 5:18 PM

## 2016-04-26 NOTE — Progress Notes (Signed)
Margaret structure so is it indurated okay  [place]  [phone] [phone]   ID: ODESSER TOURANGEAU DOB: 1960/10/21  MR#: [id number]  ZYS#:[id number]  Patient Care Team: Hoyt Koch, MD as PCP - General (Internal Medicine) Autumn Messing III, MD as Consulting Physician (General Surgery) Chauncey Cruel, MD as Consulting Physician (Oncology) Gery Pray, MD as Consulting Physician (Radiation Oncology) Mauro Kaufmann, RN as Registered Nurse Rockwell Germany, RN as Registered Nurse Benson Norway, RN as Registered Nurse PCP: Hoyt Koch, MD GYN: OTHER MD:  CHIEF COMPLAINT: Locally advanced estrogen receptor positive breast cancer; bilateral breast cancer  CURRENT TREATMENT: anastrozole, palbociclib  BREAST CANCER HISTORY: From the original intake note:  Margaret [name]'s primary care physician retired sometime ago and Margaret health maintenance has not been up-to-date. She has been receiving medical care through the emergency room and was seen in March 2015 following a head injury, then in August 2015 for lancing of an abscess. In December 2015 she noticed that Margaret left breast looked a little bit smaller than Margaret right. She brought this to the attention of friends and family over the next several months but the general feeling was that most people are not perfectly symmetrical. By the summer of this year she noticed that Margaret nipple on the left was "going in". More recently she started developing pain in the left breast. She was evaluated for this in the emergency room 04/09/2015 at which time a left breast exam showed a deformed left breast with a large hard mass encompassing most of the breast, with nipple retraction. There was no nipple discharge or bleeding and no palpable adenopathy.  The patient was referred to [facility] and on 04/13/2015 she underwent bilateral diagnostic mammography with tomosynthesis as well as bilateral breast ultrasound. The breast density  was category B. In the right breast at the 11:00 position there was a 1.3 cm irregular mass which by ultrasonography measured 1.3 cm.--In the left breast there was a 4 cm irregular mass at the 2:00 position associated with nipple retraction. There was a second, 1.7 cm area of architectural distortion at the 9:00 position. Both were palpable. By ultrasonography, the 4 cm irregular mass was noted, with a second mass measuring 2.5 cm by ultrasonography. Both axillae were benign.  On 04/19/2015 the patient underwent right breast upper outer quadrant biopsy, showing ([id number]) and invasive ductal carcinoma, grade 1, estrogen receptor 90% positive, progesterone receptor negative, with an MIB-1 of 5%, and no Margaret-2 amplification, the signals ratio being 1.26 and the number per cell 2.20.  On the same day, she underwent biopsy of the 2 left breast masses in question as well as a suspicious left axillary lymph node. All 3 biopsies showed invasive ductal carcinoma, grade 2, estrogen receptor 80-100% positive, progesterone receptor 80-100% positive, with the MIB-1 between 5 and 10%, and no Margaret-2 amplification, the signals ratio being between 1.05 and 1.13, and the number per cell between 2.10 and 2.25.  The patient's subsequent history is as detailed below  INTERVAL HISTORY: [name] returns today for follow-up of Margaret bilateral breast cancers. She has been on anastrozole for the past 2 months. The main side effects she has from that his hot flashes. She is also enrolled in the PALLAS trial and was randomized to palbociclib 125 mg capsules, started 03/29/2016 area. She is now in the "off" week She completed Margaret first cycle without event. Margaret counts have held very steady and while she does have  a variety of symptoms, these were present prior to the start of either of these medications and I don't think they are related to either of these medications.  REVIEW OF SYSTEMS: Margaret [name] tells me she almost became homeless  because of the severe economic problems she is experiencing and apparently Margaret disability has not yet come through. She is able much to work as to be at work. 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 pretty much all she can do is be friendly and occasionally dispense some of the merchandise. she has significant pain in the quads and finds it difficult to get up particular from a low seat. she has back pain as well. She has numbness in Margaret right hand particularly at night or when she wakes up in the morning. She does not have a similar problem in the left hand. The left upper extremity lymphedema she tells me is "all right". She has problems sleeping and has run out of the gabapentin and lorazepam. She does not find Naprosyn to be useful but is taking 800 mg of Motrin and that does help. A detailed review of systems today was otherwise stable  PAST MEDICAL HISTORY: Past Medical History:  Diagnosis Date  . Bilateral breast cancer ([place])   . Breast cancer ([place])   . Breast cancer of upper-outer quadrant of left female breast ([place]) 04/21/2015  . GERD (gastroesophageal reflux disease)   . Hypertension     PAST SURGICAL HISTORY: Past Surgical History:  Procedure Laterality Date  . ABDOMINAL HYSTERECTOMY  1990  . MASTECTOMY MODIFIED RADICAL Left 05/09/2015  . MASTECTOMY MODIFIED RADICAL Left 05/09/2015   Procedure: LEFT MODIFIED RADICAL MASTETCTOMY;  Surgeon: Autumn Messing III, MD;  Location: [facility];  Service: General;  Laterality: Left;  Marland Kitchen MASTECTOMY W/ SENTINEL NODE BIOPSY Right   . MASTECTOMY W/ SENTINEL NODE BIOPSY Right 05/09/2015   Procedure: RIGHT MASTECTOMY WITH RIGHT AXILLARY SENTINEL LYMPH NODE BIOPSY;  Surgeon: Autumn Messing III, MD;  Location: [facility];  Service: General;  Laterality: Right;  . PORTACATH PLACEMENT Right 09/01/2015   Procedure: INSERTION PORT-A-CATH;  Surgeon: Autumn Messing III, MD;  Location: [facility];   Service: General;  Laterality: Right;  . TUBAL LIGATION  1984    FAMILY HISTORY Family History  Problem Relation Age of Onset  . Hypertension Mother   . Diabetes Mother     the patient has little information on Margaret father. Margaret mother is living, currently age 41. She has one brother, 2 sisters. There is no history of breast or ovarian cancer in the family to Margaret knowledge.   GYNECOLOGIC HISTORY:  No LMP recorded. Patient has had a hysterectomy.  menarche age 74, first live birth age 15. She is GX P4. She underwent a total abdominal hysterectomy with bilateral salpingo-oophorectomy at age 69. She did not take hormone replacement. She never used oral contraceptives.   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 She is not employed by [employer] but by a Set designer. She is single and lives with Margaret significant other [name]. He is status post renal transplant. The patient's daughter  [name] works as a Programmer, applications, as does Margaret daughter  [name]. Daughter [name] works at [employer], as a call. All 3 live in [place]. The patient's son Marc Morgans Holsomback's  lives in [place]. He prepares sets for shows The patient has 4 grandchildren, no great grandchildren. She attends a [employer] in [place].     ADVANCED DIRECTIVES: Not in place. At the initial clinic visit the patient was given the appropriate forms to complete and notarize at Margaret discretion. The patient intends to name Margaret daughter Sampson Si as healthcare power of attorney. She can be reached at [phone].  HEALTH MAINTENANCE: Social History  Substance Use Topics  . Smoking status: Former Smoker    Packs/day: 0.10    Years: 28.00  . Smokeless tobacco: Never Used  . Alcohol use No     Colonoscopy: Never  PAP: Status post hysterectomy  Bone density: Never  Lipid panel:  Allergies  Allergen Reactions  . Latex Itching and  Other (See Comments)    burning  . 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 Wheat Bran Hives    Current Outpatient Prescriptions  Medication Sig Dispense Refill  . anastrozole (ARIMIDEX) 1 MG tablet Take 1 tablet (1 mg total) by mouth daily. 90 tablet 4  . cyclobenzaprine (FLEXERIL) 5 MG tablet Take 1 tablet (5 mg total) by mouth 3 (three) times daily as needed for muscle spasms. 30 tablet 60  . gabapentin (NEURONTIN) 300 MG capsule Take 2 capsules (600 mg total) by mouth 3 (three) times daily. 180 capsule 0  . hydrochlorothiazide (HYDRODIURIL) 12.5 MG tablet Take 1 tablet (12.5 mg total) by mouth daily. 30 tablet 3  . ibuprofen (ADVIL,MOTRIN) 800 MG tablet Take 1 tablet (800 mg total) by mouth every 8 (eight) hours as needed. 30 tablet 0  . Investigational palbociclib (IBRANCE) 125 MG capsule [facility] AFT-05 PALLAS Take 1 capsule (125 mg total) by mouth daily. Take with food. Swallow whole. Do not chew. Take on days 1-21. Repeat every 28 days. 23 capsule 0  . LORazepam (ATIVAN) 0.5 MG tablet Take 1 tablet (0.5 mg total) by mouth 2 (two) times daily as needed for anxiety. 60 tablet 0  . losartan (COZAAR) 50 MG tablet Take 1 tablet (50 mg total) by mouth daily. 30 tablet 1  . pantoprazole (PROTONIX) 40 MG tablet TAKE 1 TABLET (40 MG TOTAL) BY MOUTH DAILY. 30 tablet 1  . venlafaxine XR (EFFEXOR-XR) 150 MG 24 hr capsule Take 1 capsule (150 mg total) by mouth daily with breakfast. 30 capsule 6   No current facility-administered medications for this visit.       OBJECTIVE: Middle-aged African-American woman who appears stated age 83:   04/26/16 1501  BP: 134/89  Pulse: 91  Resp: 18  Temp: 98.8 F (37.1 C)     Body mass index is 44.89 kg/m.    ECOG FS:1 - Symptomatic but completely ambulatory   Sclerae unicteric, Slightly injected Oropharynx clear and moist-- dentition in poor repair No cervical or supraclavicular adenopathy Lungs no rales  or rhonchi Heart regular rate and rhythm Abd soft, obese, nontender, positive bowel sounds MSK no focal spinal tenderness, grade 1 chronic left upper extremity lymphedema Neuro: nonfocal, well oriented, appropriate affect Breasts: Status post bilateral mastectomies and left chest wall irradiation. There is no evidence of local recurrence. Both axillae are benign.  LAB RESULTS:  CMP     Component Value Date/Time   NA 142 04/26/2016 1449   K 3.3 (L) 04/26/2016 1449   CL 102 06/29/2015 0923   CO2 26 04/26/2016 1449   GLUCOSE 123 04/26/2016 1449   BUN 16.0  04/26/2016 1449   CREATININE 1.2 (H) 04/26/2016 1449   CALCIUM 9.7 04/26/2016 1449   PROT 8.1 04/26/2016 1449   ALBUMIN 3.6 04/26/2016 1449   AST 24 04/26/2016 1449   ALT 27 04/26/2016 1449   ALKPHOS 129 04/26/2016 1449   BILITOT <0.30 04/26/2016 1449   GFRNONAA >60 06/29/2015 0923   GFRAA >60 06/29/2015 0923    INo results found for: SPEP, UPEP  Lab Results  Component Value Date   WBC 5.3 04/26/2016   NEUTROABS 2.4 04/26/2016   HGB 12.9 04/26/2016   HCT 38.5 04/26/2016   MCV 88.1 04/26/2016   PLT 234 04/26/2016      Chemistry      Component Value Date/Time   NA 142 04/26/2016 1449   K 3.3 (L) 04/26/2016 1449   CL 102 06/29/2015 0923   CO2 26 04/26/2016 1449   BUN 16.0 04/26/2016 1449   CREATININE 1.2 (H) 04/26/2016 1449      Component Value Date/Time   CALCIUM 9.7 04/26/2016 1449   ALKPHOS 129 04/26/2016 1449   AST 24 04/26/2016 1449   ALT 27 04/26/2016 1449   BILITOT <0.30 04/26/2016 1449       No results found for: LABCA2  No components found for: LABCA125  No results for input(s): INR in the last 168 hours.  Urinalysis    Component Value Date/Time   COLORURINE YELLOW 06/29/2015 1030   APPEARANCEUR CLOUDY (A) 06/29/2015 1030   LABSPEC 1.019 06/29/2015 1030   PHURINE 7.0 06/29/2015 1030   GLUCOSEU NEGATIVE 06/29/2015 1030   HGBUR TRACE (A) 06/29/2015 1030   BILIRUBINUR NEGATIVE 06/29/2015  1030   KETONESUR NEGATIVE 06/29/2015 1030   PROTEINUR NEGATIVE 06/29/2015 1030   NITRITE NEGATIVE 06/29/2015 1030   LEUKOCYTESUR MODERATE (A) 06/29/2015 1030    STUDIES: No results found.   ASSESSMENT: 55 y.o. [place] woman with synchronous breast cancers, as follows  (a) status post right breast upper outer quadrant biopsy 04/19/2015 for a clinical pT1c N0, stage IA invasive ductal carcinoma, grade 1, estrogen receptor positive, progesterone receptor negative, with an MIB-1 of 5%, and no Margaret-2 amplification  (b) status post left breast upper outer quadrant biopsy 2 and left axillary lymph node biopsy 04/19/2015 for a clinical T3 N1, stage IIIA invasive ductal carcinoma, grade 1 or 2, estrogen receptor and progesterone receptor positive, Margaret-2 negative, with an MIB-1 between 5 and 10%  (1) status post bilateral mastectomies 05/09/2015, showing:   (a) on the right side, a pT2 pN0, stage IIA invasive ductal carcinoma, with negative margins and repeat Margaret-2 again negative    (b) on the left, a pT3 pN2, stage IIIA invasive ductal carcinoma, grade 2, with negative margins, and repeat Margaret-2 again negative  (2) Mammaprint from the left-sided tumor returned "luminal type, low risk", predicting a small benefit from chemotherapy with the important caveat that N2 disease was not included in the MINDACT study--patient opted for chemotherapy  (3) Oncotype from the right-sided tumor showed a score of 12, predicting an 8% risk of recurrence outside the breast within the next 10 years, if the patient's only systemic therapy was tamoxifen for 5 years. It also predicted no significant benefit from chemotherapy  (4) postmastectomy radiation to the left chest wall completed 08/15/2015  (5) adjuvant chemotherapy consisting of cyclophosphamide and docetaxel x4 completed on 11/21/15.  (5) anastrozole started 02/08/2016  (6) left upper extremity lymphedema  (7) signed for B-WELL study 02/08/2016  (8)  participating in [place] trial as of August 2017  PLAN: Ellarae is now a year out from definitive surgery for breast cancer with no evidence of disease recurrence. His is very favorable.  I don't believe the pain she has in Margaret hips and quads is related to either the anastrozole or palbociclib. For one thing these plans were present before. They are related to Margaret deconditioning and weight, and indeed if she does not eventually get the weight under control she will have significant knee problems as well.  On the other hand the right wrist problem I think is going to be due to carpal tunnel. I suggested she try a splint on Margaret right wrist and wear it at night. I think that will significantly relieve Margaret symptoms.  I wrote Margaret for an 800 mg Motrin tablet that she can take for the pain she is experiencing and refilled Margaret venlafaxine and gabapentin and I also gave Margaret prescription for lorazepam which is helping Margaret with anxiety problem  We will check Margaret labs again in 2 weeks and she will see me again in 4 weeks.  She knows to call for any problems that may develop before Margaret next visit here.     Chauncey Cruel, MD   04/26/2016 3:45 PM

## 2016-04-27 ENCOUNTER — Encounter: Payer: BLUE CROSS/BLUE SHIELD | Admitting: *Deleted

## 2016-04-27 DIAGNOSIS — C50412 Malignant neoplasm of upper-outer quadrant of left female breast: Secondary | ICD-10-CM

## 2016-04-27 DIAGNOSIS — C50211 Malignant neoplasm of upper-inner quadrant of right female breast: Secondary | ICD-10-CM

## 2016-04-27 MED ORDER — INV-PALBOCICLIB 125 MG CAPS #23 ALLIANCE FOUNDATION AFT-05 (PALLAS): 125.0000 mg | ORAL_CAPSULE | Freq: Every day | ORAL | 0 refills | Status: DC

## 2016-04-27 NOTE — Progress Notes (Signed)
04/27/2016 Patient in to clinic this morning for study medication dispensing, due to patient leaving clinic on previous day prior to receiving her drug. Patient did not bring empty cycle 1 medication bottle or completed diaries with her, but she is planning to bring these to her Day 15 lab appointment. Patient confirms that palbociclib was taken as instructed, with last dose taken on 04/18/2016. Based on yesterday's assessment, patient condition is acceptable for continued treatment. Reviewed study medication and anti-hormone therapy instructions with patient. Patient was given copies of diaries for both medications for Cycle 2, beginning today, due to missed dosing yesterday. Confirmed patient's appointments for October 5th, for Day 14 lab tests to be completed. Thanked patient for her participation. Cindy S. Brigitte Pulse BSN, RN, CCRP 04/27/2016 2:05 PM   Adverse Event Log Cycle 1: 03/29/2016 - 04/26/2016 Event Grade Onset Date Resolved Date Attribution to anastrozole Attribution to palbociclib Treatment Comments  Dry skin Grade 1 04/04/2016 ongoing Yes No    Itching Grade 1 04/04/2016 ongoing No No    Hypokalemia Grade 1 04/12/2016 ongoing No No    Elevated glucose Grade 1 04/12/2016 04/26/2016 No No    Flu-like symptoms Grade 2 04/19/2016 ongoing No Yes    Fatigue Grade 1 04/19/2016 04/26/2016 No Yes    Joint pain Grade 2 04/19/2016 ongoing No No Ibuprofen   Elevated creatinne Grade 1 04/26/2016 ongoing No No    Cindy S. Brigitte Pulse BSN, RN, [place] 06/19/2016 3:24 PM

## 2016-05-01 ENCOUNTER — Telehealth: Payer: Self-pay | Admitting: Endocrinology

## 2016-05-01 NOTE — Telephone Encounter (Signed)
Rec'd from forward 5 pages to Dr. Florina Ou

## 2016-05-03 ENCOUNTER — Other Ambulatory Visit: Payer: Self-pay | Admitting: *Deleted

## 2016-05-03 MED ORDER — HYDROCHLOROTHIAZIDE 12.5 MG PO TABS: 12.5000 mg | ORAL_TABLET | Freq: Every day | ORAL | 3 refills | Status: DC

## 2016-05-07 ENCOUNTER — Other Ambulatory Visit: Payer: Self-pay | Admitting: *Deleted

## 2016-05-07 DIAGNOSIS — C50211 Malignant neoplasm of upper-inner quadrant of right female breast: Secondary | ICD-10-CM

## 2016-05-07 DIAGNOSIS — C50412 Malignant neoplasm of upper-outer quadrant of left female breast: Secondary | ICD-10-CM

## 2016-05-07 MED ORDER — VENLAFAXINE HCL ER 150 MG PO CP24: 150.0000 mg | ORAL_CAPSULE | Freq: Every day | ORAL | 1 refills | Status: DC

## 2016-05-09 ENCOUNTER — Telehealth: Payer: Self-pay

## 2016-05-10 ENCOUNTER — Encounter: Payer: BLUE CROSS/BLUE SHIELD | Admitting: *Deleted

## 2016-05-10 ENCOUNTER — Other Ambulatory Visit: Payer: BLUE CROSS/BLUE SHIELD

## 2016-05-10 ENCOUNTER — Ambulatory Visit: Payer: BLUE CROSS/BLUE SHIELD | Admitting: Oncology

## 2016-05-10 ENCOUNTER — Other Ambulatory Visit: Payer: Self-pay | Admitting: Oncology

## 2016-05-10 ENCOUNTER — Other Ambulatory Visit (HOSPITAL_BASED_OUTPATIENT_CLINIC_OR_DEPARTMENT_OTHER): Payer: BLUE CROSS/BLUE SHIELD

## 2016-05-10 DIAGNOSIS — C50411 Malignant neoplasm of upper-outer quadrant of right female breast: Secondary | ICD-10-CM

## 2016-05-10 DIAGNOSIS — Z006 Encounter for examination for normal comparison and control in clinical research program: Secondary | ICD-10-CM | POA: Diagnosis not present

## 2016-05-10 DIAGNOSIS — C50412 Malignant neoplasm of upper-outer quadrant of left female breast: Secondary | ICD-10-CM

## 2016-05-10 DIAGNOSIS — Z170 Estrogen receptor positive status [ER+]: Principal | ICD-10-CM

## 2016-05-10 LAB — CBC WITH DIFFERENTIAL/PLATELET
BASO%: 1.2 % (ref 0.0–2.0)
Basophils Absolute: 0.1 10*3/uL (ref 0.0–0.1)
EOS%: 1.4 % (ref 0.0–7.0)
Eosinophils Absolute: 0.1 10*3/uL (ref 0.0–0.5)
HCT: 37.4 % (ref 34.8–46.6)
HGB: 12.2 g/dL (ref 11.6–15.9)
LYMPH%: 34.8 % (ref 14.0–49.7)
MCH: 29 pg (ref 25.1–34.0)
MCHC: 32.6 g/dL (ref 31.5–36.0)
MCV: 89 fL (ref 79.5–101.0)
MONO#: 0.3 10*3/uL (ref 0.1–0.9)
MONO%: 5.4 % (ref 0.0–14.0)
NEUT#: 3.4 10*3/uL (ref 1.5–6.5)
NEUT%: 57.2 % (ref 38.4–76.8)
Platelets: 287 10*3/uL (ref 145–400)
RBC: 4.2 10*6/uL (ref 3.70–5.45)
RDW: 17.4 % — ABNORMAL HIGH (ref 11.2–14.5)
WBC: 5.9 10*3/uL (ref 3.9–10.3)
lymph#: 2.1 10*3/uL (ref 0.9–3.3)

## 2016-05-10 NOTE — Progress Notes (Signed)
05/10/2016 Patient in to clinic today for Day 14 lab tests. Patient was reminded to bring study medication and diaries for cycle 1. Empty palbociclib bottle from Cycle 1 was returned by the patient and taken to pharmacy for drug accountability by pharmacist Henreitta Leber. Patient reports that her granddaughter had colored all over the Cycle 1 diaries with marker, and patient requested another copy to complete. Forms were provided for this purpose, and patient stated that she took her medication at the same time every day, and all palbociclib was taken as instructed and not beyond Day 21.  Patient had no new symptoms to report today. Patient then proceeded to lab for collection of Day 15 CBC. Left voice mail message for patient on mobile phone number, after her lab results were received and found to be within parameters for continued treatment. Instructed patient to continue taking palbociclib and anastrozole as instructed. Cindy S. Brigitte Pulse BSN, RN, CCRP 05/10/2016 2:44 PM

## 2016-05-15 ENCOUNTER — Other Ambulatory Visit: Payer: Self-pay | Admitting: Oncology

## 2016-05-15 DIAGNOSIS — C50412 Malignant neoplasm of upper-outer quadrant of left female breast: Secondary | ICD-10-CM

## 2016-05-18 ENCOUNTER — Ambulatory Visit (INDEPENDENT_AMBULATORY_CARE_PROVIDER_SITE_OTHER): Payer: BLUE CROSS/BLUE SHIELD | Admitting: Internal Medicine

## 2016-05-18 ENCOUNTER — Encounter: Payer: Self-pay | Admitting: Internal Medicine

## 2016-05-18 DIAGNOSIS — Z23 Encounter for immunization: Secondary | ICD-10-CM

## 2016-05-18 DIAGNOSIS — R0789 Other chest pain: Secondary | ICD-10-CM

## 2016-05-18 MED ORDER — HYDROCODONE-ACETAMINOPHEN 5-325 MG PO TABS: 1.0000 | ORAL_TABLET | Freq: Two times a day (BID) | ORAL | 0 refills | Status: DC | PRN

## 2016-05-18 NOTE — Patient Instructions (Signed)
We have given you a prescription for hydrocodone today for the pain. You can take it 1 pill up to 2 times per day.   We do need to see you back in about 1-2 months to evaluate if you still need this medication or if it is working well.

## 2016-05-18 NOTE — Progress Notes (Signed)
Pre visit review using our clinic review tool, if applicable. No additional management support is needed unless otherwise documented below in the visit note. 

## 2016-05-18 NOTE — Progress Notes (Signed)
   Subjective:    Patient ID: Margaret [name], female    DOB: 11/25/1960, 55 y.o.   MRN: [id number]  HPI The patient is a 55 YO female coming in for pain in her chest wall where she previously had drainage tubes with breast surgery. This was about 1 year ago. She denies skin change or drainage. She is on a new study drug medication for the last month or so. This is going on for several months and she was given ibuprofen by her oncologist which did not help much so she doubled up on the dosage. She does normally take her bp meds in the evening and has not taken yet today. No headaches or chest pains. No SOB or cough.   Review of Systems  Constitutional: Negative for activity change, appetite change, fatigue and fever.  Respiratory: Positive for chest tightness. Negative for cough, shortness of breath and wheezing.   Cardiovascular: Positive for chest pain. Negative for palpitations and leg swelling.  Gastrointestinal: Negative.   Musculoskeletal: Negative.   Skin: Negative.       Objective:   Physical Exam  Constitutional: She is oriented to person, place, and time. She appears well-developed and well-nourished.  HENT:  Head: Normocephalic and atraumatic.  Eyes: EOM are normal.  Neck: Normal range of motion.  Cardiovascular: Normal rate and regular rhythm.   Pulmonary/Chest: Effort normal and breath sounds normal. No respiratory distress. She has no wheezes. She has no rales. She exhibits tenderness.  Abdominal: Soft.  Neurological: She is alert and oriented to person, place, and time.   Vitals:   05/18/16 1423  BP: (!) 158/100  Pulse: (!) 104  Resp: 18  Temp: 98.6 F (37 C)  TempSrc: Oral  SpO2: 98%  Weight: 297 lb (134.7 kg)  Height: 5\' 9"  (1.753 m)      Assessment & Plan:  Flu shot given at visit.

## 2016-05-19 NOTE — Assessment & Plan Note (Signed)
Rx for hydrocodone and encouraged her to follow up with her surgeon for evaluation. It is unclear if the worsening pain is related to her study drug and I have encouraged her to talk to her oncologist about that chance.

## 2016-05-23 ENCOUNTER — Other Ambulatory Visit: Payer: Self-pay | Admitting: Oncology

## 2016-05-23 DIAGNOSIS — C50911 Malignant neoplasm of unspecified site of right female breast: Secondary | ICD-10-CM

## 2016-05-23 DIAGNOSIS — G629 Polyneuropathy, unspecified: Secondary | ICD-10-CM

## 2016-05-23 DIAGNOSIS — C50912 Malignant neoplasm of unspecified site of left female breast: Principal | ICD-10-CM

## 2016-05-24 ENCOUNTER — Other Ambulatory Visit: Payer: BLUE CROSS/BLUE SHIELD

## 2016-05-24 ENCOUNTER — Telehealth: Payer: Self-pay

## 2016-05-24 ENCOUNTER — Ambulatory Visit: Payer: BLUE CROSS/BLUE SHIELD | Admitting: Oncology

## 2016-05-24 NOTE — Telephone Encounter (Signed)
Called patient to check on the status of her coming in for her appointment today in which she states that she forgot,she was at the car shop having her car worked on  and thought it was Friday 05/25/16.  Jenny Reichmann has been made aware of this and per Val it is ok to reschedule and double book her on Wed. 05/30/16.  Jenny Reichmann is aware and is ok with the new appointment.  The patient was called back the new appointment for 05/30/16 3:30 andthis was confirmed with her.

## 2016-05-29 ENCOUNTER — Ambulatory Visit: Payer: BLUE CROSS/BLUE SHIELD | Admitting: Internal Medicine

## 2016-05-30 ENCOUNTER — Telehealth: Payer: Self-pay

## 2016-05-30 ENCOUNTER — Other Ambulatory Visit (HOSPITAL_BASED_OUTPATIENT_CLINIC_OR_DEPARTMENT_OTHER): Payer: BLUE CROSS/BLUE SHIELD

## 2016-05-30 ENCOUNTER — Ambulatory Visit (HOSPITAL_COMMUNITY)
Admission: RE | Admit: 2016-05-30 | Discharge: 2016-05-30 | Disposition: A | Discharge status: Home / Self Care | Payer: BLUE CROSS/BLUE SHIELD | Source: Ambulatory Visit | Attending: Oncology | Admitting: Oncology

## 2016-05-30 ENCOUNTER — Encounter: Payer: BLUE CROSS/BLUE SHIELD | Admitting: *Deleted

## 2016-05-30 ENCOUNTER — Ambulatory Visit (HOSPITAL_BASED_OUTPATIENT_CLINIC_OR_DEPARTMENT_OTHER): Payer: BLUE CROSS/BLUE SHIELD | Admitting: Oncology

## 2016-05-30 VITALS — BP 165/102 | HR 91 | Temp 98.6°F | Resp 18 | Ht 69.0 in | Wt 302.0 lb

## 2016-05-30 DIAGNOSIS — C50211 Malignant neoplasm of upper-inner quadrant of right female breast: Secondary | ICD-10-CM

## 2016-05-30 DIAGNOSIS — Z170 Estrogen receptor positive status [ER+]: Principal | ICD-10-CM

## 2016-05-30 DIAGNOSIS — C50412 Malignant neoplasm of upper-outer quadrant of left female breast: Secondary | ICD-10-CM

## 2016-05-30 DIAGNOSIS — C50411 Malignant neoplasm of upper-outer quadrant of right female breast: Secondary | ICD-10-CM

## 2016-05-30 DIAGNOSIS — C50812 Malignant neoplasm of overlapping sites of left female breast: Secondary | ICD-10-CM | POA: Diagnosis not present

## 2016-05-30 DIAGNOSIS — Z006 Encounter for examination for normal comparison and control in clinical research program: Secondary | ICD-10-CM | POA: Diagnosis not present

## 2016-05-30 DIAGNOSIS — Z79811 Long term (current) use of aromatase inhibitors: Secondary | ICD-10-CM

## 2016-05-30 DIAGNOSIS — E876 Hypokalemia: Secondary | ICD-10-CM

## 2016-05-30 DIAGNOSIS — M25552 Pain in left hip: Secondary | ICD-10-CM

## 2016-05-30 LAB — COMPREHENSIVE METABOLIC PANEL WITH GFR
ALT: 40 U/L (ref 0–55)
AST: 26 U/L (ref 5–34)
Albumin: 3.6 g/dL (ref 3.5–5.0)
Alkaline Phosphatase: 116 U/L (ref 40–150)
Anion Gap: 9 meq/L (ref 3–11)
BUN: 13.3 mg/dL (ref 7.0–26.0)
CO2: 29 meq/L (ref 22–29)
Calcium: 9.7 mg/dL (ref 8.4–10.4)
Chloride: 104 meq/L (ref 98–109)
Creatinine: 0.9 mg/dL (ref 0.6–1.1)
EGFR: 86 ml/min/1.73 m2 — ABNORMAL LOW
Glucose: 158 mg/dL — ABNORMAL HIGH (ref 70–140)
Potassium: 3.2 meq/L — ABNORMAL LOW (ref 3.5–5.1)
Sodium: 142 meq/L (ref 136–145)
Total Bilirubin: <0.22 mg/dL (ref 0.20–1.20)
Total Protein: 8.1 g/dL (ref 6.4–8.3)

## 2016-05-30 LAB — CBC WITH DIFFERENTIAL/PLATELET
BASO%: 1.4 % (ref 0.0–2.0)
Basophils Absolute: 0.1 10*3/uL (ref 0.0–0.1)
EOS%: 1.9 % (ref 0.0–7.0)
Eosinophils Absolute: 0.2 10*3/uL (ref 0.0–0.5)
HCT: 39.3 % (ref 34.8–46.6)
HGB: 12.8 g/dL (ref 11.6–15.9)
LYMPH%: 19.1 % (ref 14.0–49.7)
MCH: 29.8 pg (ref 25.1–34.0)
MCHC: 32.7 g/dL (ref 31.5–36.0)
MCV: 91.1 fL (ref 79.5–101.0)
MONO#: 1.2 10*3/uL — ABNORMAL HIGH (ref 0.1–0.9)
MONO%: 11 % (ref 0.0–14.0)
NEUT#: 7 10*3/uL — ABNORMAL HIGH (ref 1.5–6.5)
NEUT%: 66.6 % (ref 38.4–76.8)
Platelets: 240 10*3/uL (ref 145–400)
RBC: 4.31 10*6/uL (ref 3.70–5.45)
RDW: 20.9 % — ABNORMAL HIGH (ref 11.2–14.5)
WBC: 10.5 10*3/uL — ABNORMAL HIGH (ref 3.9–10.3)
lymph#: 2 10*3/uL (ref 0.9–3.3)

## 2016-05-30 MED ORDER — TRAMADOL HCL 50 MG PO TABS: 50.0000 mg | ORAL_TABLET | Freq: Four times a day (QID) | ORAL | 0 refills | Status: DC | PRN

## 2016-05-30 MED ORDER — POTASSIUM CHLORIDE CRYS ER 10 MEQ PO TBCR: 10.0000 meq | EXTENDED_RELEASE_TABLET | Freq: Every day | ORAL | 4 refills | Status: DC

## 2016-05-30 NOTE — Progress Notes (Signed)
05/30/2016 Patient in to clinic this afternoon for Cycle 3 visit. Of note, patient missed last week's scheduled appointment, which was rescheduled to the next available appointment today. Upon arrival to the clinic, PRO questionnaires were completed independently by the patient. The patient then proceeded to the lab for collection of routine labs. Completed Cycle 2 diaries and palbociclib pill bottle were returned by patient today; pill bottle returned to pharmacy for drug accountability by pharmacist, Romualdo Bolk. Due to delayed visit today, patient wrote in additional dosing of anti-hormone therapy at the bottom of the pill diary, noting that she took the medication daily around 7:00pm. Patient confirms that palbociclib was taken as instructed, with last dose taken on 05/17/2016. Based on lab results review and history and physical by Dr. Jana Hakim, patient condition is acceptable for continued treatment. Reviewed study medication and anti-hormone therapy instructions with patient. Patient was given copies of diaries for both medications for Cycles 3-5, along with three bottles of study medication for the next three cycles. Confirmed that patient will have her next study follow-up visit in January, with interim labs per MD preference. Cindy S. Brigitte Pulse BSN, RN, [place] 05/30/2016 5:12 PM  Adverse Event Log Cycle 2: 04/27/2016 - 05/29/2016 (cycle started one day late, and ended on 05/29/2016, due to patient's schedule) Event Grade Onset Date Resolved Date Attribution to palbociclib Attribution to anastrozole Treatment Comments  Dry skin Grade 1 04/04/2016 ongoing No Yes    Itching Grade 1 04/04/2016 ongoing No No    Hypokalemia Grade 1 04/12/2016 ongoing No No    Flu-like symptoms Grade 2 04/19/2016 ongoing Yes No    Joint pain Grade 2 04/19/2016 ongoing No No Tramadol Not limiting ADLs  Elevated creatinne Grade 1 04/26/2016 ongoing No No    Elevated glucose Grade 1 05/30/2016 ongoing No No    Hypertension Grade  3 05/30/2016 ongoing No No See 06/26/2016 follow-up - ran out of medication.   Depression Grade 1 05/23/2016 ongoing No No  Not limiting ADLs, ongoing for at least one week  Cindy S. Brigitte Pulse BSN, RN, CCRP 07/11/2016 10:13 AM

## 2016-05-30 NOTE — Progress Notes (Signed)
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 She has been receiving medical care through the emergency room and was seen in March 2015 following a head injury, then in August 2015 for lancing of an abscess. In December 2015 she noticed that her left breast looked a little bit smaller than her right. She brought this to the attention of friends and family over the next several months but the general feeling was that most people are not perfectly symmetrical. By the summer of this year she noticed that her nipple on the left was "going in". More recently she started developing pain in the left breast. She was evaluated for this in the emergency room 04/09/2015 at which time a left breast exam showed a deformed left breast with a large hard mass encompassing most of the breast, with nipple retraction. There was no nipple discharge or bleeding and no palpable adenopathy.  The patient was referred to [facility] and on 04/13/2015 she underwent bilateral diagnostic mammography with tomosynthesis as well as bilateral breast ultrasound. The breast density  was category B. In the right breast at the 11:00 position there was a 1.3 cm irregular mass which by ultrasonography measured 1.3 cm.--In the left breast there was a 4 cm irregular mass at the 2:00 position associated with nipple retraction. There was a second, 1.7 cm area of architectural distortion at the 9:00 position. Both were palpable. By ultrasonography, the 4 cm irregular mass was noted, with a second mass measuring 2.5 cm by ultrasonography. Both axillae were benign.  On 04/19/2015 the patient underwent right breast upper outer quadrant biopsy, showing ([id number]) and invasive ductal carcinoma, grade 1, estrogen receptor 90% positive, progesterone receptor negative, with an MIB-1 of 5%, and no HER-2 amplification, the signals ratio being 1.26 and the number per cell 2.20.  On the same day, she underwent biopsy of the 2 left breast masses in question as well as a suspicious left axillary lymph node. All 3 biopsies showed invasive ductal carcinoma, grade 2, estrogen receptor 80-100% positive, progesterone receptor 80-100% positive, with the MIB-1 between 5 and 10%, and no HER-2 amplification, the signals ratio being between 1.05 and 1.13, and the number per cell between 2.10 and 2.25.  The patient's subsequent history is as detailed below  INTERVAL HISTORY: Margaret [name] returns today for follow-up of her estrogen receptor positive breast cancers. She continues on anastrozole, generally with good tolerance. She does have hot flashes. Vaginal dryness is not a major issue and she has not experienced significant arthralgias or myalgias from this medication.  She is also on palbociclib/Ibrance through the PALLAS study. Today is the final day of the current cycle, and she is due to resume treatment tomorrow. She tolerates this well,  with no significant fatigue or bruising complaints.  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 She likes that  through [place] supported by them. She does have arthritis symptoms here and there particularly involving her left hip. The pain there can be "excruciating". She describes it as constant. She also complains of depression. Aside from these issues a detailed review of systems today was stable  PAST MEDICAL HISTORY: Past Medical History:  Diagnosis Date  . Bilateral breast cancer ([place])   . Breast cancer ([place])   . Breast cancer of upper-outer quadrant of left female breast ([place]) 04/21/2015  . GERD (gastroesophageal reflux disease)   . Hypertension     PAST SURGICAL HISTORY: Past Surgical History:  Procedure Laterality Date  . ABDOMINAL HYSTERECTOMY  1990  . MASTECTOMY MODIFIED RADICAL Left 05/09/2015  . MASTECTOMY MODIFIED RADICAL Left 05/09/2015   Procedure: LEFT MODIFIED RADICAL MASTETCTOMY;  Surgeon: Autumn Messing III, MD;  Location: [place];  Service: General;  Laterality: Left;  Marland Kitchen MASTECTOMY W/ SENTINEL NODE BIOPSY Right   . MASTECTOMY W/ SENTINEL NODE BIOPSY Right 05/09/2015   Procedure: RIGHT MASTECTOMY WITH RIGHT AXILLARY SENTINEL LYMPH NODE BIOPSY;  Surgeon: Autumn Messing III, MD;  Location: [place];  Service: General;  Laterality: Right;  . PORTACATH PLACEMENT Right 09/01/2015   Procedure: INSERTION PORT-A-CATH;  Surgeon: Autumn Messing III, MD;  Location: [facility];  Service: General;  Laterality: Right;  . TUBAL LIGATION  1984    FAMILY HISTORY Family History  Problem Relation Age of Onset  . Hypertension Mother   . Diabetes Mother     the patient has little information on her father. Her mother is living, currently age 28. She has one brother, 2 sisters. There is no history of breast or ovarian cancer in the family to her knowledge.   GYNECOLOGIC HISTORY:  No LMP recorded. Patient has had a hysterectomy.  menarche age 4, first live birth age 27. She is GX P4. She underwent a total abdominal hysterectomy with bilateral salpingo-oophorectomy at age 9. She did not take hormone  replacement. She never used oral contraceptives.   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 She is not employed by [employer] but by a Set designer. She is single and lives with her significant other [name]. He is status post renal transplant. The patient's daughter  [name] works as a Programmer, applications, as does her daughter  [name]. Daughter [name] works at [employer], as a call. All 3 live in [place]. The patient's son Marc Morgans Elena's lives in [place]. He prepares sets for shows The patient has 4 grandchildren, no great grandchildren. She attends a [employer] in [place].     ADVANCED DIRECTIVES: Not in place. At the initial clinic visit the patient was given the appropriate forms to complete and notarize at her discretion. The patient intends to name her daughter Sampson Si as healthcare power of attorney. She can be reached at [phone].  HEALTH MAINTENANCE: Social History  Substance Use Topics  . Smoking status: Former Smoker    Packs/day: 0.10    Years: 28.00  . Smokeless tobacco: Never Used  . Alcohol use No     Colonoscopy: Never  PAP: Status post hysterectomy  Bone density: Never  Lipid panel:  Allergies  Allergen Reactions  . Latex Itching and Other (See Comments)  burning  . 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 Wheat Bran Hives    Current Outpatient Prescriptions  Medication Sig Dispense Refill  . anastrozole (ARIMIDEX) 1 MG tablet Take 1 tablet (1 mg total) by mouth daily. (Patient not taking: Reported on 05/18/2016) 90 tablet 4  . cyclobenzaprine (FLEXERIL) 5 MG tablet Take 1 tablet (5 mg total) by mouth 3 (three) times daily as needed for muscle spasms. 30 tablet 60  . gabapentin (NEURONTIN) 300 MG capsule TAKE 2 CAPSULES BY MOUTH 3 TIMES A DAY 180 capsule 0  . hydrochlorothiazide (HYDRODIURIL) 12.5 MG tablet Take 1  tablet (12.5 mg total) by mouth daily. 30 tablet 3  . HYDROcodone-acetaminophen (NORCO/VICODIN) 5-325 MG tablet Take 1 tablet by mouth 2 (two) times daily as needed for moderate pain. 30 tablet 0  . ibuprofen (ADVIL,MOTRIN) 800 MG tablet TAKE 1 TABLET (800 MG TOTAL) BY MOUTH EVERY 8 (EIGHT) HOURS AS NEEDED. 30 tablet 0  . Investigational palbociclib (IBRANCE) 125 MG capsule [facility] AFT-05 PALLAS Take 1 capsule (125 mg total) by mouth daily. Take with food. Swallow whole. Do not chew. Take on days 1-21. Repeat every 28 days. 23 capsule 0  . LORazepam (ATIVAN) 0.5 MG tablet Take 1 tablet (0.5 mg total) by mouth 2 (two) times daily as needed for anxiety. 60 tablet 0  . losartan (COZAAR) 50 MG tablet Take 1 tablet (50 mg total) by mouth daily. 30 tablet 1  . pantoprazole (PROTONIX) 40 MG tablet TAKE 1 TABLET (40 MG TOTAL) BY MOUTH DAILY. 30 tablet 1  . venlafaxine XR (EFFEXOR-XR) 150 MG 24 hr capsule Take 1 capsule (150 mg total) by mouth daily with breakfast. 90 capsule 1   No current facility-administered medications for this visit.       OBJECTIVE: Middle-aged African-American woman  Vitals:   05/30/16 1602  BP: (!) 165/102  Pulse: 91  Resp: 18  Temp: 98.6 F (37 C)     Body mass index is 44.6 kg/m.    ECOG FS:1 - Symptomatic but completely ambulatory   Sclerae unicteric, pupils round and equal Oropharynx clear and moist-- no thrush or other lesions No cervical or supraclavicular adenopathy Lungs no rales or rhonchi Heart regular rate and rhythm Abd soft, obese, nontender, positive bowel sounds MSK no focal spinal tenderness, left hip pain limits range of motion Neuro: nonfocal, well oriented, appropriate affect Breasts: Deferred    LAB RESULTS:  CMP     Component Value Date/Time   NA 142 05/30/2016 1529   K 3.2 (L) 05/30/2016 1529   CL 102 06/29/2015 0923   CO2 29 05/30/2016 1529   GLUCOSE 158 (H) 05/30/2016 1529   BUN 13.3 05/30/2016 1529   CREATININE 0.9  05/30/2016 1529   CALCIUM 9.7 05/30/2016 1529   PROT 8.1 05/30/2016 1529   ALBUMIN 3.6 05/30/2016 1529   AST 26 05/30/2016 1529   ALT 40 05/30/2016 1529   ALKPHOS 116 05/30/2016 1529   BILITOT <0.22 05/30/2016 1529   GFRNONAA >60 06/29/2015 0923   GFRAA >60 06/29/2015 0923    INo results found for: SPEP, UPEP  Lab Results  Component Value Date   WBC 10.5 (H) 05/30/2016   NEUTROABS 7.0 (H) 05/30/2016   HGB 12.8 05/30/2016   HCT 39.3 05/30/2016   MCV 91.1 05/30/2016   PLT 240 05/30/2016      Chemistry      Component Value Date/Time   NA 142 05/30/2016  1529   K 3.2 (L) 05/30/2016 1529   CL 102 06/29/2015 0923   CO2 29 05/30/2016 1529   BUN 13.3 05/30/2016 1529   CREATININE 0.9 05/30/2016 1529      Component Value Date/Time   CALCIUM 9.7 05/30/2016 1529   ALKPHOS 116 05/30/2016 1529   AST 26 05/30/2016 1529   ALT 40 05/30/2016 1529   BILITOT <0.22 05/30/2016 1529       No results found for: LABCA2  No components found for: LABCA125  No results for input(s): INR in the last 168 hours.  Urinalysis    Component Value Date/Time   COLORURINE YELLOW 06/29/2015 1030   APPEARANCEUR CLOUDY (A) 06/29/2015 1030   LABSPEC 1.019 06/29/2015 1030   PHURINE 7.0 06/29/2015 1030   GLUCOSEU NEGATIVE 06/29/2015 1030   HGBUR TRACE (A) 06/29/2015 1030   BILIRUBINUR NEGATIVE 06/29/2015 1030   KETONESUR NEGATIVE 06/29/2015 1030   PROTEINUR NEGATIVE 06/29/2015 1030   NITRITE NEGATIVE 06/29/2015 1030   LEUKOCYTESUR MODERATE (A) 06/29/2015 1030    STUDIES: No results found.   ASSESSMENT: 55 y.o. [place] woman with synchronous breast cancers, as follows  (a) status post right breast upper outer quadrant biopsy 04/19/2015 for a clinical pT1c N0, stage IA invasive ductal carcinoma, grade 1, estrogen receptor positive, progesterone receptor negative, with an MIB-1 of 5%, and no HER-2 amplification  (b) status post left breast upper outer quadrant biopsy 2 and left axillary  lymph node biopsy 04/19/2015 for a clinical T3 N1, stage IIIA invasive ductal carcinoma, grade 1 or 2, estrogen receptor and progesterone receptor positive, HER-2 negative, with an MIB-1 between 5 and 10%  (1) status post bilateral mastectomies 05/09/2015, showing:   (a) on the right side, a pT2 pN0, stage IIA invasive ductal carcinoma, with negative margins and repeat HER-2 again negative    (b) on the left, a pT3 pN2, stage IIIA invasive ductal carcinoma, grade 2, with negative margins, and repeat HER-2 again negative  (2) Mammaprint from the left-sided tumor returned "luminal type, low risk", predicting a small benefit from chemotherapy with the important caveat that N2 disease was not included in the MINDACT study--patient opted for chemotherapy  (3) Oncotype from the right-sided tumor showed a score of 12, predicting an 8% risk of recurrence outside the breast within the next 10 years, if the patient's only systemic therapy was tamoxifen for 5 years. It also predicted no significant benefit from chemotherapy  (4) postmastectomy radiation to the left chest wall completed 08/15/2015  (5) adjuvant chemotherapy consisting of cyclophosphamide and docetaxel x4 completed on 11/21/15.  (5) anastrozole started 02/08/2016  (6) left upper extremity lymphedema  (7) signed for B-WELL study 02/08/2016  (8) participating in [place] trial as of August 2017  PLAN: Margaret [name] is now a year out from definitive surgery from her breast cancer with no evidence of disease recurrence. This is  Favorable  She is tolerating the anastrozole and follow-up palbociclib with no unusual symptoms and in fact no symptoms that I can detect specific to those medications.  The date problems she is having is a left hip pain. I added tramadol to her Advil today and I set her up for plain films of the left hip for further evaluation.  I'm starting her on potassium supplementation at least temporarily as her potassium level has  been consistently on the low side  I have set her up for lab work every 4 weeks which is my usual practice with patients on Ibrance. She will have  additional labs or studies as necessary. She will see me again in January. She knows to call for any other problems that may develop before that visit.     Chauncey Cruel, MD   05/30/2016 4:11 PM

## 2016-05-30 NOTE — Telephone Encounter (Signed)
Spoke with patient and she verified her appointment this afternoon.

## 2016-05-31 ENCOUNTER — Encounter: Payer: Self-pay | Admitting: Oncology

## 2016-05-31 ENCOUNTER — Other Ambulatory Visit: Payer: Self-pay | Admitting: Oncology

## 2016-05-31 DIAGNOSIS — C50211 Malignant neoplasm of upper-inner quadrant of right female breast: Secondary | ICD-10-CM

## 2016-05-31 DIAGNOSIS — Z170 Estrogen receptor positive status [ER+]: Principal | ICD-10-CM

## 2016-05-31 DIAGNOSIS — C50412 Malignant neoplasm of upper-outer quadrant of left female breast: Secondary | ICD-10-CM

## 2016-05-31 MED ORDER — INV-PALBOCICLIB 125 MG CAPS #23 ALLIANCE FOUNDATION AFT-05 (PALLAS): 125.0000 mg | ORAL_CAPSULE | Freq: Every day | ORAL | 0 refills | Status: DC

## 2016-06-07 ENCOUNTER — Other Ambulatory Visit: Payer: Self-pay | Admitting: Oncology

## 2016-06-08 ENCOUNTER — Other Ambulatory Visit: Payer: Self-pay | Admitting: *Deleted

## 2016-06-08 MED ORDER — VALACYCLOVIR HCL 500 MG PO TABS: 500.0000 mg | ORAL_TABLET | Freq: Every day | ORAL | 1 refills | Status: DC

## 2016-06-11 ENCOUNTER — Encounter: Payer: Self-pay | Admitting: *Deleted

## 2016-06-23 ENCOUNTER — Other Ambulatory Visit: Payer: Self-pay | Admitting: Oncology

## 2016-06-23 MED ORDER — TRIAMTERENE-HCTZ 37.5-25 MG PO CAPS: 1.0000 | ORAL_CAPSULE | Freq: Every day | ORAL | 4 refills | Status: DC

## 2016-06-23 NOTE — Progress Notes (Signed)
Concern re HTN-- added diazyde

## 2016-06-25 ENCOUNTER — Other Ambulatory Visit: Payer: Self-pay | Admitting: *Deleted

## 2016-06-25 DIAGNOSIS — Z170 Estrogen receptor positive status [ER+]: Principal | ICD-10-CM

## 2016-06-25 DIAGNOSIS — C50211 Malignant neoplasm of upper-inner quadrant of right female breast: Secondary | ICD-10-CM

## 2016-06-26 ENCOUNTER — Other Ambulatory Visit: Payer: BLUE CROSS/BLUE SHIELD

## 2016-06-26 ENCOUNTER — Telehealth: Payer: Self-pay | Admitting: *Deleted

## 2016-06-26 ENCOUNTER — Other Ambulatory Visit: Payer: Self-pay | Admitting: Oncology

## 2016-06-26 DIAGNOSIS — C50911 Malignant neoplasm of unspecified site of right female breast: Secondary | ICD-10-CM

## 2016-06-26 DIAGNOSIS — G629 Polyneuropathy, unspecified: Secondary | ICD-10-CM

## 2016-06-26 DIAGNOSIS — C50912 Malignant neoplasm of unspecified site of left female breast: Principal | ICD-10-CM

## 2016-06-26 MED ORDER — LOSARTAN POTASSIUM 50 MG PO TABS: 50.0000 mg | ORAL_TABLET | Freq: Every day | ORAL | 1 refills | Status: DC

## 2016-06-26 MED ORDER — HYDROCHLOROTHIAZIDE 12.5 MG PO TABS: 12.5000 mg | ORAL_TABLET | Freq: Every day | ORAL | 3 refills | Status: DC

## 2016-06-26 NOTE — Telephone Encounter (Signed)
This RN spoke with pt per follow up with BP concerns and new medication being prescribed, noted as well pt was no show for BP check this AM.  Per contact with pt - she states " I over slept and totally missed my appointment "  Margaret [name] also states she thinks her BP is elevated due to " I have been out of all my BP meds and am waiting for refills "  Per above this RN informed pt due to concerns with BP readings this office will refill her losartan and HCTZ - she is to pick them today and then come in tomorrow at 2 pm for BP check.  Prescription for dyazide is to be on hold at present due to pt needs to resume current BP meds - have BP rechecked and then if needed add additional drugs.  This RN contacted pt's pharmacy ( [facility] ) on [address] - discussed above with pharmacist - refills given on current BP meds - with Dyazide being put on hold.  This note will be sent to Tyrell Antonio in [place] for communication.

## 2016-07-11 ENCOUNTER — Other Ambulatory Visit: Payer: Self-pay | Admitting: Oncology

## 2016-07-11 DIAGNOSIS — C50412 Malignant neoplasm of upper-outer quadrant of left female breast: Secondary | ICD-10-CM

## 2016-07-17 ENCOUNTER — Ambulatory Visit (INDEPENDENT_AMBULATORY_CARE_PROVIDER_SITE_OTHER): Payer: BLUE CROSS/BLUE SHIELD | Admitting: Internal Medicine

## 2016-07-17 ENCOUNTER — Encounter: Payer: Self-pay | Admitting: Internal Medicine

## 2016-07-17 DIAGNOSIS — F4323 Adjustment disorder with mixed anxiety and depressed mood: Secondary | ICD-10-CM

## 2016-07-17 DIAGNOSIS — I10 Essential (primary) hypertension: Secondary | ICD-10-CM | POA: Diagnosis not present

## 2016-07-17 MED ORDER — LORAZEPAM 0.5 MG PO TABS: 0.5000 mg | ORAL_TABLET | Freq: Two times a day (BID) | ORAL | 1 refills | Status: DC | PRN

## 2016-07-17 MED ORDER — ALPRAZOLAM 1 MG PO TABS: 1.0000 mg | ORAL_TABLET | Freq: Two times a day (BID) | ORAL | 1 refills | Status: DC | PRN

## 2016-07-17 MED ORDER — LOSARTAN POTASSIUM-HCTZ 100-25 MG PO TABS: 1.0000 | ORAL_TABLET | Freq: Every day | ORAL | 3 refills | Status: DC

## 2016-07-17 NOTE — Assessment & Plan Note (Signed)
Increase and combination pill losartan/hctz 100/25 instead of losartan 50 and hctz 12.5.

## 2016-07-17 NOTE — Assessment & Plan Note (Signed)
Taking venlafaxine 150 mg daily and can increase in the future if needed. Given rx for alprazolam today for short term use. Given stress management suggestions and advised to start talking to family and friends more as she supports them and they can hear about her problems sometimes too.

## 2016-07-17 NOTE — Patient Instructions (Signed)
We have sent in a combination pill of the blood pressure with losartan and hctz in 1 pill. We have increased the dose a little too.   We have given you some xanax to use as well for the stress and pressure which you can take as needed up to twice a day.   Time does heal all wounds and I hope that at least seeing your family over the holidays can help some.    Stress and Stress Management Stress is a normal reaction to life events. It is what you feel when life demands more than you are used to or more than you can handle. Some stress can be useful. For example, the stress reaction can help you catch the last bus of the day, study for a test, or meet a deadline at work. But stress that occurs too often or for too long can cause problems. It can affect your emotional health and interfere with relationships and normal daily activities. Too much stress can weaken your immune system and increase your risk for physical illness. If you already have a medical problem, stress can make it worse. What are the causes? All sorts of life events may cause stress. An event that causes stress for one person may not be stressful for another person. Major life events commonly cause stress. These may be positive or negative. Examples include losing your job, moving into a new home, getting married, having a baby, or losing a loved one. Less obvious life events may also cause stress, especially if they occur day after day or in combination. Examples include working long hours, driving in traffic, caring for children, being in debt, or being in a difficult relationship. What are the signs or symptoms? Stress may cause emotional symptoms including, the following:  Anxiety. This is feeling worried, afraid, on edge, overwhelmed, or out of control.  Anger. This is feeling irritated or impatient.  Depression. This is feeling sad, down, helpless, or guilty.  Difficulty focusing, remembering, or making decisions. Stress may  cause physical symptoms, including the following:  Aches and pains. These may affect your head, neck, back, stomach, or other areas of your body.  Tight muscles or clenched jaw.  Low energy or trouble sleeping. Stress may cause unhealthy behaviors, including the following:  Eating to feel better (overeating) or skipping meals.  Sleeping too little, too much, or both.  Working too much or putting off tasks (procrastination).  Smoking, drinking alcohol, or using drugs to feel better. How is this diagnosed? Stress is diagnosed through an assessment by your health care provider. Your health care provider will ask questions about your symptoms and any stressful life events.Your health care provider will also ask about your medical history and may order blood tests or other tests. Certain medical conditions and medicine can cause physical symptoms similar to stress. Mental illness can cause emotional symptoms and unhealthy behaviors similar to stress. Your health care provider may refer you to a mental health professional for further evaluation. How is this treated? Stress management is the recommended treatment for stress.The goals of stress management are reducing stressful life events and coping with stress in healthy ways. Techniques for reducing stressful life events include the following:  Stress identification. Self-monitor for stress and identify what causes stress for you. These skills may help you to avoid some stressful events.  Time management. Set your priorities, keep a calendar of events, and learn to say "no." These tools can help you avoid making too many  commitments. Techniques for coping with stress include the following:  Rethinking the problem. Try to think realistically about stressful events rather than ignoring them or overreacting. Try to find the positives in a stressful situation rather than focusing on the negatives.  Exercise. Physical exercise can release both  physical and emotional tension. The key is to find a form of exercise you enjoy and do it regularly.  Relaxation techniques. These relax the body and mind. Examples include yoga, meditation, tai chi, biofeedback, deep breathing, progressive muscle relaxation, listening to music, being out in nature, journaling, and other hobbies. Again, the key is to find one or more that you enjoy and can do regularly.  Healthy lifestyle. Eat a balanced diet, get plenty of sleep, and do not smoke. Avoid using alcohol or drugs to relax.  Strong support network. Spend time with family, friends, or other people you enjoy being around.Express your feelings and talk things over with someone you trust. Counseling or talktherapy with a mental health professional may be helpful if you are having difficulty managing stress on your own. Medicine is typically not recommended for the treatment of stress.Talk to your health care provider if you think you need medicine for symptoms of stress. Follow these instructions at home:  Keep all follow-up visits as directed by your health care provider.  Take all medicines as directed by your health care provider. Contact a health care provider if:  Your symptoms get worse or you start having new symptoms.  You feel overwhelmed by your problems and can no longer manage them on your own. Get help right away if:  You feel like hurting yourself or someone else. This information is not intended to replace advice given to you by your health care provider. Make sure you discuss any questions you have with your health care provider. Document Released: 01/16/2001 Document Revised: 12/29/2015 Document Reviewed: 03/17/2013 Elsevier Interactive Patient Education  2017 [employer].

## 2016-07-17 NOTE — Progress Notes (Signed)
   Subjective:    Patient ID: Margaret [name], female    DOB: [birth year], 55 y.o.   MRN: [id number]  HPI The patient is a 55 YO female coming in for concerns about depression. She is going through a divorce in an 58 year marriage. She is struggling with that and feeling more down lately. Denies SI/HI. He left her for another woman and told her she was only half a woman after her breast cancer. She is also worried that her blood pressure is running high right now.   Review of Systems  Constitutional: Negative for activity change, appetite change, fatigue, fever and unexpected weight change.  Respiratory: Negative.   Cardiovascular: Negative.   Gastrointestinal: Negative.   Musculoskeletal: Negative.   Neurological: Positive for headaches.  Psychiatric/Behavioral: Positive for agitation, decreased concentration, dysphoric mood and sleep disturbance. Negative for self-injury and suicidal ideas. The patient is nervous/anxious.       Objective:   Physical Exam  Constitutional: She is oriented to person, place, and time. She appears well-developed and well-nourished.  Overweight  HENT:  Head: Normocephalic and atraumatic.  Cardiovascular: Normal rate and regular rhythm.   Pulmonary/Chest: Effort normal and breath sounds normal.  Abdominal: Soft.  Neurological: She is alert and oriented to person, place, and time. Coordination normal.  Psychiatric:  Tearful during the visit.    Vitals:   07/17/16 1305 07/17/16 1400  BP: (!) 180/100 (!) 150/94  Pulse: (!) 107   Resp: 18   Temp: 99 F (37.2 C)   TempSrc: Oral   SpO2: 98%   Weight: 300 lb (136.1 kg)   Height: 5\' 9"  (1.753 m)       Assessment & Plan:

## 2016-07-17 NOTE — Progress Notes (Signed)
Pre visit review using our clinic review tool, if applicable. No additional management support is needed unless otherwise documented below in the visit note. 

## 2016-07-20 NOTE — Telephone Encounter (Signed)
error 

## 2016-07-23 ENCOUNTER — Other Ambulatory Visit: Payer: Self-pay | Admitting: Oncology

## 2016-07-24 ENCOUNTER — Other Ambulatory Visit (HOSPITAL_BASED_OUTPATIENT_CLINIC_OR_DEPARTMENT_OTHER): Payer: BLUE CROSS/BLUE SHIELD

## 2016-07-24 ENCOUNTER — Encounter: Payer: Self-pay | Admitting: *Deleted

## 2016-07-24 DIAGNOSIS — Z170 Estrogen receptor positive status [ER+]: Principal | ICD-10-CM

## 2016-07-24 DIAGNOSIS — C50411 Malignant neoplasm of upper-outer quadrant of right female breast: Secondary | ICD-10-CM | POA: Diagnosis not present

## 2016-07-24 DIAGNOSIS — C50412 Malignant neoplasm of upper-outer quadrant of left female breast: Secondary | ICD-10-CM

## 2016-07-24 DIAGNOSIS — C50211 Malignant neoplasm of upper-inner quadrant of right female breast: Secondary | ICD-10-CM

## 2016-07-24 LAB — CBC WITH DIFFERENTIAL/PLATELET
BASO%: 1.8 % (ref 0.0–2.0)
Basophils Absolute: 0.1 10*3/uL (ref 0.0–0.1)
EOS%: 1.5 % (ref 0.0–7.0)
Eosinophils Absolute: 0.1 10*3/uL (ref 0.0–0.5)
HCT: 42.4 % (ref 34.8–46.6)
HGB: 14.1 g/dL (ref 11.6–15.9)
LYMPH%: 31.9 % (ref 14.0–49.7)
MCH: 32.6 pg (ref 25.1–34.0)
MCHC: 33.4 g/dL (ref 31.5–36.0)
MCV: 97.7 fL (ref 79.5–101.0)
MONO#: 0.7 10*3/uL (ref 0.1–0.9)
MONO%: 10.4 % (ref 0.0–14.0)
NEUT#: 3.8 10*3/uL (ref 1.5–6.5)
NEUT%: 54.4 % (ref 38.4–76.8)
Platelets: 224 10*3/uL (ref 145–400)
RBC: 4.34 10*6/uL (ref 3.70–5.45)
RDW: 18.3 % — ABNORMAL HIGH (ref 11.2–14.5)
WBC: 6.9 10*3/uL (ref 3.9–10.3)
lymph#: 2.2 10*3/uL (ref 0.9–3.3)

## 2016-07-24 LAB — COMPREHENSIVE METABOLIC PANEL
ALT: 38 U/L (ref 0–55)
AST: 29 U/L (ref 5–34)
Albumin: 3.6 g/dL (ref 3.5–5.0)
Alkaline Phosphatase: 103 U/L (ref 40–150)
Anion Gap: 12 mEq/L — ABNORMAL HIGH (ref 3–11)
BUN: 31.1 mg/dL — ABNORMAL HIGH (ref 7.0–26.0)
CO2: 29 mEq/L (ref 22–29)
Calcium: 10.1 mg/dL (ref 8.4–10.4)
Chloride: 100 mEq/L (ref 98–109)
Creatinine: 1.8 mg/dL — ABNORMAL HIGH (ref 0.6–1.1)
EGFR: 36 mL/min/{1.73_m2} — ABNORMAL LOW (ref >90)
Glucose: 158 mg/dl — ABNORMAL HIGH (ref 70–140)
Potassium: 3.4 mEq/L — ABNORMAL LOW (ref 3.5–5.1)
Sodium: 141 mEq/L (ref 136–145)
Total Bilirubin: 0.25 mg/dL (ref 0.20–1.20)
Total Protein: 8.5 g/dL — ABNORMAL HIGH (ref 6.4–8.3)

## 2016-07-24 NOTE — Progress Notes (Signed)
07/24/2016 Patient in to clinic today for interim lab appointment, for monthly testing per MD discretion. Patient was seen by PCP last week with adjustments to her blood pressure medication. Reviewed medication dosing of her antihypertensives with patient. When questioned if she was ever off of the medication for any time period, patient stated "No".  Patient states that when prescription was called in last month by Dr. Virgie Dad nurse, it was "the same thing I'd been taking", so she did not fill it but continued to take her regular medication, which was increased by Dr. Sharlet Salina last week. Patient was given a copy of her upcoming appointments in January, with instructions to fast after 8pm on 08/16/2016, prior to 8am lab draw for the [place] study on 1/12/108. She will have additional, non-fasting, labs collected on Thursday 08/23/2016, prior to her appointment with Dr. Jana Hakim. Patient requested a reminder call about her lab appointment with fasting requirement. Patient confirms that she has been taking palbociclib as instructed, and confirmed with patient that her next cycle of dosing begins tomorrow. Cindy S. Brigitte Pulse BSN, RN, CCRP 07/24/2016 2:40 PM

## 2016-08-09 ENCOUNTER — Other Ambulatory Visit: Payer: Self-pay | Admitting: *Deleted

## 2016-08-09 DIAGNOSIS — Z170 Estrogen receptor positive status [ER+]: Principal | ICD-10-CM

## 2016-08-09 DIAGNOSIS — C50412 Malignant neoplasm of upper-outer quadrant of left female breast: Secondary | ICD-10-CM

## 2016-08-10 ENCOUNTER — Other Ambulatory Visit: Payer: Self-pay | Admitting: *Deleted

## 2016-08-10 ENCOUNTER — Other Ambulatory Visit: Payer: BLUE CROSS/BLUE SHIELD

## 2016-08-16 ENCOUNTER — Telehealth: Payer: Self-pay

## 2016-08-16 NOTE — Telephone Encounter (Signed)
Spoke with patient and she is aware of her fasting labs on 08/17/16 at 8:00am.  She is also aware to bring her pallas study diaries and pill bottles

## 2016-08-17 ENCOUNTER — Ambulatory Visit: Payer: BLUE CROSS/BLUE SHIELD | Admitting: Oncology

## 2016-08-17 ENCOUNTER — Encounter: Payer: BLUE CROSS/BLUE SHIELD | Admitting: *Deleted

## 2016-08-17 ENCOUNTER — Other Ambulatory Visit (HOSPITAL_BASED_OUTPATIENT_CLINIC_OR_DEPARTMENT_OTHER): Payer: BLUE CROSS/BLUE SHIELD

## 2016-08-17 VITALS — Wt 294.9 lb

## 2016-08-17 DIAGNOSIS — Z006 Encounter for examination for normal comparison and control in clinical research program: Secondary | ICD-10-CM | POA: Diagnosis not present

## 2016-08-17 DIAGNOSIS — C50411 Malignant neoplasm of upper-outer quadrant of right female breast: Secondary | ICD-10-CM | POA: Diagnosis not present

## 2016-08-17 DIAGNOSIS — Z170 Estrogen receptor positive status [ER+]: Principal | ICD-10-CM

## 2016-08-17 DIAGNOSIS — C50412 Malignant neoplasm of upper-outer quadrant of left female breast: Secondary | ICD-10-CM

## 2016-08-17 LAB — WHOLE BLOOD GLUCOSE: Glucose: 157 mg/dL — ABNORMAL HIGH (ref 70–100)

## 2016-08-17 LAB — RESEARCH LABS

## 2016-08-17 NOTE — Progress Notes (Signed)
08/17/2016   [facility] [id number] BWEL - Month 6 visit Upon arrival to clinic, patient had been fasting since 9:30pm yesterday. Random glucose sample was collected along with baseline research blood samples, including optional samples for the [id number] substudy. Follow-up questionnaire was completed independently by the patient. Patient denies the occurrence of any fractures, sprains, tendone/ligament injuries or orthopedic surgeries in the past six months since study enrollment. Weight and waist and hip circumference measurements were obtained per protocol, and following the instructions in the BWEL Weight and Height Protocol document. Patient's physical exam is scheduled for next Thursday 08/23/2016.  AFT-05 PALLAS - Interim visit Patient in to clinic today for [place] lab sample collection. Completed Cycle 3-5 diaries and palbociclib pill bottles were returned by patient today; pill bottle returned to pharmacy for drug accountability by pharmacist, Henreitta Leber. Anti-hormone pill diaries were returned by the patient, and diaries for Cycles 3 and 4 were retained at the site; Cycle 5 diary returned to patient for completion over the next week, to be returned at her next clinic visit on 08/23/2016. Patient is aware that she will resume palbociblib at that time, once re-treatment criteria have been assessed. Cindy S. Brigitte Pulse BSN, RN, [facility] 08/17/2016 8:42 AM

## 2016-08-23 ENCOUNTER — Ambulatory Visit: Payer: BLUE CROSS/BLUE SHIELD | Admitting: Oncology

## 2016-08-23 ENCOUNTER — Telehealth: Payer: Self-pay

## 2016-08-23 ENCOUNTER — Other Ambulatory Visit: Payer: BLUE CROSS/BLUE SHIELD

## 2016-08-23 NOTE — Telephone Encounter (Signed)
Spoke with the patient and she has asked to move her appointment to tomorrow 1/19 which has been approved by val and dr Jana Hakim.  With regard to her labs for PALLAS I spoke with Shantee at 1 [phone] [employer] and she gave the ok to draw labs 1/19 and ship on 1/22 while placing the whole blood in the refrigerator.  Jenny Reichmann is aware.

## 2016-08-24 ENCOUNTER — Other Ambulatory Visit: Payer: Self-pay | Admitting: *Deleted

## 2016-08-24 ENCOUNTER — Telehealth: Payer: Self-pay

## 2016-08-24 ENCOUNTER — Other Ambulatory Visit: Payer: BLUE CROSS/BLUE SHIELD

## 2016-08-24 ENCOUNTER — Ambulatory Visit: Payer: BLUE CROSS/BLUE SHIELD | Admitting: Oncology

## 2016-08-24 DIAGNOSIS — Z170 Estrogen receptor positive status [ER+]: Principal | ICD-10-CM

## 2016-08-24 DIAGNOSIS — C50412 Malignant neoplasm of upper-outer quadrant of left female breast: Secondary | ICD-10-CM

## 2016-08-24 NOTE — Telephone Encounter (Signed)
Patient has just called in and states that she does not have a ride for today.  Dr Doris Cheadle had an opening on Monday 08/27/16 and I have booked this for her and she agrees.

## 2016-08-25 ENCOUNTER — Encounter: Payer: Self-pay | Admitting: Oncology

## 2016-08-27 ENCOUNTER — Other Ambulatory Visit (HOSPITAL_BASED_OUTPATIENT_CLINIC_OR_DEPARTMENT_OTHER): Payer: BLUE CROSS/BLUE SHIELD

## 2016-08-27 ENCOUNTER — Ambulatory Visit (HOSPITAL_BASED_OUTPATIENT_CLINIC_OR_DEPARTMENT_OTHER): Payer: BLUE CROSS/BLUE SHIELD | Admitting: Oncology

## 2016-08-27 ENCOUNTER — Encounter: Payer: BLUE CROSS/BLUE SHIELD | Admitting: *Deleted

## 2016-08-27 VITALS — BP 166/90 | HR 102 | Temp 97.8°F | Resp 18 | Ht 69.0 in | Wt 301.1 lb

## 2016-08-27 DIAGNOSIS — C50411 Malignant neoplasm of upper-outer quadrant of right female breast: Secondary | ICD-10-CM | POA: Diagnosis not present

## 2016-08-27 DIAGNOSIS — F4321 Adjustment disorder with depressed mood: Secondary | ICD-10-CM

## 2016-08-27 DIAGNOSIS — C50012 Malignant neoplasm of nipple and areola, left female breast: Secondary | ICD-10-CM

## 2016-08-27 DIAGNOSIS — C50011 Malignant neoplasm of nipple and areola, right female breast: Secondary | ICD-10-CM

## 2016-08-27 DIAGNOSIS — C50412 Malignant neoplasm of upper-outer quadrant of left female breast: Secondary | ICD-10-CM

## 2016-08-27 DIAGNOSIS — C50211 Malignant neoplasm of upper-inner quadrant of right female breast: Secondary | ICD-10-CM

## 2016-08-27 DIAGNOSIS — Z170 Estrogen receptor positive status [ER+]: Principal | ICD-10-CM

## 2016-08-27 DIAGNOSIS — C50812 Malignant neoplasm of overlapping sites of left female breast: Secondary | ICD-10-CM

## 2016-08-27 DIAGNOSIS — Z79811 Long term (current) use of aromatase inhibitors: Secondary | ICD-10-CM

## 2016-08-27 DIAGNOSIS — Z006 Encounter for examination for normal comparison and control in clinical research program: Secondary | ICD-10-CM

## 2016-08-27 DIAGNOSIS — G629 Polyneuropathy, unspecified: Secondary | ICD-10-CM

## 2016-08-27 LAB — CBC WITH DIFFERENTIAL/PLATELET
BASO%: 1.9 % (ref 0.0–2.0)
Basophils Absolute: 0.1 10*3/uL (ref 0.0–0.1)
EOS%: 1.8 % (ref 0.0–7.0)
Eosinophils Absolute: 0.1 10*3/uL (ref 0.0–0.5)
HCT: 38.6 % (ref 34.8–46.6)
HGB: 13 g/dL (ref 11.6–15.9)
LYMPH%: 25.6 % (ref 14.0–49.7)
MCH: 33.7 pg (ref 25.1–34.0)
MCHC: 33.7 g/dL (ref 31.5–36.0)
MCV: 100.2 fL (ref 79.5–101.0)
MONO#: 0.9 10*3/uL (ref 0.1–0.9)
MONO%: 11.7 % (ref 0.0–14.0)
NEUT#: 4.4 10*3/uL (ref 1.5–6.5)
NEUT%: 59 % (ref 38.4–76.8)
Platelets: 207 10*3/uL (ref 145–400)
RBC: 3.85 10*6/uL (ref 3.70–5.45)
RDW: 15.2 % — ABNORMAL HIGH (ref 11.2–14.5)
WBC: 7.5 10*3/uL (ref 3.9–10.3)
lymph#: 1.9 10*3/uL (ref 0.9–3.3)

## 2016-08-27 LAB — COMPREHENSIVE METABOLIC PANEL
ALT: 36 U/L (ref 0–55)
AST: 19 U/L (ref 5–34)
Albumin: 3.5 g/dL (ref 3.5–5.0)
Alkaline Phosphatase: 117 U/L (ref 40–150)
Anion Gap: 10 mEq/L (ref 3–11)
BUN: 21.3 mg/dL (ref 7.0–26.0)
CO2: 27 mEq/L (ref 22–29)
Calcium: 10.2 mg/dL (ref 8.4–10.4)
Chloride: 103 mEq/L (ref 98–109)
Creatinine: 1.2 mg/dL — ABNORMAL HIGH (ref 0.6–1.1)
EGFR: 59 mL/min/{1.73_m2} — ABNORMAL LOW (ref >90)
Glucose: 132 mg/dl (ref 70–140)
Potassium: 3.8 mEq/L (ref 3.5–5.1)
Sodium: 140 mEq/L (ref 136–145)
Total Bilirubin: 0.22 mg/dL (ref 0.20–1.20)
Total Protein: 7.8 g/dL (ref 6.4–8.3)

## 2016-08-27 LAB — RESEARCH LABS

## 2016-08-27 MED ORDER — INV-PALBOCICLIB 125 MG CAPS #23 ALLIANCE FOUNDATION AFT-05 (PALLAS): 125.0000 mg | ORAL_CAPSULE | Freq: Every day | ORAL | 0 refills | Status: DC

## 2016-08-27 MED ORDER — GABAPENTIN 300 MG PO CAPS: 600.0000 mg | ORAL_CAPSULE | Freq: Three times a day (TID) | ORAL | 1 refills | Status: DC

## 2016-08-27 NOTE — Progress Notes (Signed)
08/27/2016 Patient in to clinic today for routine visit, which had to be rescheduled from last week due to inclement weather and car trouble.   [facility] AFT-05 PALLAS Cycle 6      Upon arrival to the clinic, questionnaires were completed independently by the patient. Patient then proceeded to lab for collection of standard of care and research blood samples. Patient did not remember to return her Cycle 5 anti-hormone drug therapy diary today, and a postage-paid, pre-addressed envelope for the Research Department was given to the patient to mail the diary in. Patient reports ongoing complaints of dry skin, itching and intermittent flu-like symptoms. Her depression continues, particularly since she recently lost her job. Patient confirms that palbociclib was taken as instructed, with last dose taken on 08/14/2016. Based on lab results review and history and physical by Dr. Jana Hakim, patient condition is acceptable for continued treatment. Reviewed study medication and anti-hormone therapy instructions with patient. Patient was given copies of diaries for both medications for Cycles 6-8, along with three bottles of study medication for the next three cycles. Confirmed that patient will have her next study follow-up visit in April, with interim labs per MD preference.  [facility] [id number] BWEL Month 6  Patient in to clinic today for physical exam for the BWEL study. Remaining Month 6 assessments were conducted on 08/17/2016 when the patient was here for fasting lab work for the study. Patient continues to receive study-related information and indicates that it is helpful to her.  Cindy S. Brigitte Pulse BSN, RN, CCRP 08/27/2016 4:59 PM    Adverse Event Log PALLAS Cycles 3-5: 05/30/2016 - 08/27/2016 Event Grade Onset Date Resolved Date Attribution to palbociclib Attribution to anastrozole Treatment Comments  Dry skin Grade 1 04/04/2016 ongoing No Yes    Itching Grade 1 04/04/2016 ongoing No No    Hypokalemia Grade 1  04/12/2016 ongoing No No    Flu-like symptoms Grade 2 04/19/2016 ongoing Yes No    Joint pain Grade 2 04/19/2016 ongoing No No Tramadol Not limiting ADLs  Elevated creatinne Grade 1 04/26/2016 05/30/2016 No No    Elevated creatinne Grade 2 07/24/2016 ongoing No No  Improved to grade 1 on 08/27/2016.  Elevated glucose Grade 1 05/30/2016 ongoing No No    Hypertension Grade 3 05/30/2016 ongoing No No    Depression Grade 1 05/23/2016 07/17/2016 No No  Worsened  Depression Grade 2 07/17/2016 ongoing No No    Decreased concentration Grade 1 07/17/2016 ongoing Yes Yes    Tachycardia Grade 1 08/27/2016 ongoing No No    Cindy S. Brigitte Pulse BSN, RN, [place] 10/24/2016 3:04 PM

## 2016-08-27 NOTE — Progress Notes (Signed)
[name]  [facility]  [phone] [phone]   ID: Margaret [name] DOB: 11/30/1960  MR#: [id number]  CSN#:[id number]  Patient Care Team: Elizabeth A Crawford, MD as PCP - General (Internal Medicine) Paul Toth III, MD as Consulting Physician (General Surgery) Gustav C Magrinat, MD as Consulting Physician (Oncology) James Kinard, MD as Consulting Physician (Radiation Oncology) Dawn C Stuart, RN as Registered Nurse Keisha N Martini, RN as Registered Nurse Cynthia S Shaw, RN as Registered Nurse PCP: Elizabeth A Crawford, MD GYN: OTHER MD:  CHIEF COMPLAINT: 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 She has been receiving medical care through the emergency room and was seen in March 2015 following a head injury, then in August 2015 for lancing of an abscess. In December 2015 she noticed that her left breast looked a little bit smaller than her right. She brought this to the attention of friends and family over the next several months but the general feeling was that most people are not perfectly symmetrical. By the summer of this year she noticed that her nipple on the left was "going in". More recently she started developing pain in the left breast. She was evaluated for this in the emergency room 04/09/2015 at which time a left breast exam showed a deformed left breast with a large hard mass encompassing most of the breast, with nipple retraction. There was no nipple discharge or bleeding and no palpable adenopathy.  The patient was referred to [facility] and on 04/13/2015 she underwent bilateral diagnostic mammography with tomosynthesis as well as bilateral breast ultrasound. The breast density  was category B. In the right breast at the 11:00 position there was a 1.3 cm irregular mass which by ultrasonography measured 1.3 cm.--In the left breast there was a 4 cm irregular mass at the 2:00 position associated with nipple retraction. There was a second, 1.7 cm area of architectural distortion at the 9:00 position. Both were palpable. By ultrasonography, the 4 cm irregular mass was noted, with a second mass measuring 2.5 cm by ultrasonography. Both axillae were benign.  On 04/19/2015 the patient underwent right breast upper outer quadrant biopsy, showing ([id number]) and invasive ductal carcinoma, grade 1, estrogen receptor 90% positive, progesterone receptor negative, with an MIB-1 of 5%, and no HER-2 amplification, the signals ratio being 1.26 and the number per cell 2.20.  On the same day, she underwent biopsy of the 2 left breast masses in question as well as a suspicious left axillary lymph node. All 3 biopsies showed invasive ductal carcinoma, grade 2, estrogen receptor 80-100% positive, progesterone receptor 80-100% positive, with the MIB-1 between 5 and 10%, and no HER-2 amplification, the signals ratio being between 1.05 and 1.13, and the number per cell between 2.10 and 2.25.  The patient's subsequent history is as detailed below  INTERVAL HISTORY: Margaret [name] returns today for follow-up of her estrogen receptor positive cancers. The interval history has been disastrous. She was let go up her job and her husband has moved out. She is home alone. She is very depressed.  From a breast cancer point of view she is doing remarkably well. She is tolerating the anastrozole well.  Hot flashes and vaginal dryness are not a major issue. She never developed the arthralgias or myalgias   that many patients can experience on this medication. She obtains it at a good price.  She is about to start her current cycle of palbociclib. We have not had to reduce her dose and there have been no dose delays so far.  She is tolerating this equally well.   REVIEW OF SYSTEMS: Margaret [name] continues to have left hip pain. We evaluated this last visit and was nothing we could find by plain films. She also has soreness and pain in the left chest wall area, which is of course the main surgical site that was irradiated. Neither of these problems suggest recurrence of disease. She has very depressed given the recent events just noted. A detailed review of systems was otherwise stable.  PAST MEDICAL HISTORY: Past Medical History:  Diagnosis Date  . Bilateral breast cancer (HCC)   . Breast cancer (HCC)   . Breast cancer of upper-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) 04/21/2015  . GERD (gastroesophageal reflux disease)   . Hypertension     PAST SURGICAL HISTORY: Past Surgical History:  Procedure Laterality Date  . ABDOMINAL HYSTERECTOMY  1990  . MASTECTOMY MODIFIED RADICAL Left 05/09/2015  . MASTECTOMY MODIFIED RADICAL Left 05/09/2015   Procedure: LEFT MODIFIED RADICAL MASTETCTOMY;  Surgeon: Paul Toth III, MD;  Location: [facility] OR;  Service: General;  Laterality: Left;  . MASTECTOMY W/ SENTINEL NODE BIOPSY Right   . MASTECTOMY W/ SENTINEL NODE BIOPSY Right 05/09/2015   Procedure: RIGHT MASTECTOMY WITH RIGHT AXILLARY SENTINEL LYMPH NODE BIOPSY;  Surgeon: Paul Toth III, MD;  Location: [facility] OR;  Service: General;  Laterality: Right;  . PORTACATH PLACEMENT Right 09/01/2015   Procedure: INSERTION PORT-A-CATH;  Surgeon: Paul Toth III, MD;  Location: [facility];  Service: General;  Laterality: Right;  . TUBAL LIGATION  1984    FAMILY HISTORY Family History  Problem Relation Age of Onset  . Hypertension Mother   . Diabetes Mother     the patient has little information on her father. Her mother is living, currently age 74. She has one brother, 2 sisters. There is no history of breast or ovarian cancer in the family to her knowledge.   GYNECOLOGIC HISTORY:  No LMP recorded. Patient has had a hysterectomy.   menarche age 11, first live birth age 18. She is GX P4. She underwent a total abdominal hysterectomy with bilateral salpingo-oophorectomy at age 27. She did not take hormone replacement. She never used oral contraceptives.   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 she is currently unemployed. She is single and lives alone  The patient's daughter  Raniesha Williams works as a home health aide, as does her daughter  Aliciiea Alila. Daughter Shawanda Bee works at [employer], as a call. All 3 live in [place]. The patient's son Lonnie Prange's lives in [place]. He prepares sets for shows The patient has 4 grandchildren, no great grandchildren. She attends a [employer] in [place].     ADVANCED DIRECTIVES: Not in place. At the initial clinic visit the patient was given the appropriate forms to complete and notarize at her discretion. The patient intends to name her daughter Raniesh Williams as healthcare power of attorney. She can be reached at [phone].  HEALTH MAINTENANCE: Social History  Substance Use Topics  . Smoking status: Former Smoker    Packs/day: 0.10    Years: 28.00  . Smokeless tobacco: Never Used  .   Alcohol use No     Colonoscopy: Never  PAP: Status post hysterectomy  Bone density: Never  Lipid panel:  Allergies  Allergen Reactions  . Latex Itching and Other (See Comments)    burning  . 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 Wheat Bran Hives    Current Outpatient Prescriptions  Medication Sig Dispense Refill  . ALPRAZolam (XANAX) 1 MG tablet Take 1 tablet (1 mg total) by mouth 2 (two) times daily as needed for anxiety. 60 tablet 1  . anastrozole (ARIMIDEX) 1 MG tablet Take 1 tablet (1 mg total) by mouth daily. 90 tablet 4  . gabapentin (NEURONTIN) 300 MG capsule Take 2 capsules (600 mg total) by mouth 3 (three) times daily. 180 capsule 1  .  ibuprofen (ADVIL,MOTRIN) 800 MG tablet TAKE 1 TABLET (800 MG TOTAL) BY MOUTH EVERY 8 (EIGHT) HOURS AS NEEDED. 30 tablet 0  . Investigational palbociclib (IBRANCE) 125 MG capsule [facility] AFT-05 PALLAS Take 1 capsule (125 mg total) by mouth daily. Take with food. Swallow whole. Do not chew. Take on days 1-21. Repeat every 28 days. 69 capsule 0  . losartan-hydrochlorothiazide (HYZAAR) 100-25 MG tablet Take 1 tablet by mouth daily. 90 tablet 3  . pantoprazole (PROTONIX) 40 MG tablet TAKE 1 TABLET (40 MG TOTAL) BY MOUTH DAILY. 30 tablet 1  . venlafaxine XR (EFFEXOR-XR) 150 MG 24 hr capsule Take 1 capsule (150 mg total) by mouth daily with breakfast. 90 capsule 1   No current facility-administered medications for this visit.       OBJECTIVE: Middle-aged African-American woman Who was tearful during today's visit Vitals:   08/27/16 1436  BP: (!) 166/90  Pulse: (!) 102  Resp: 18  Temp: 97.8 F (36.6 C)     Body mass index is 44.46 kg/m.    ECOG FS:2 - Symptomatic, <50% confined to bed   Sclerae unicteric, EOMs intact Oropharynx clear and moist No cervical or supraclavicular adenopathy Lungs no rales or rhonchi Heart regular rate and rhythm Abd soft, nontender, positive bowel sounds MSK no focal spinal tenderness, no upper extremity lymphedema Neuro: nonfocal, well oriented, appropriate affect Breasts: Status post bilateral mastectomies and left sided irradiation. There is no evidence of local recurrence. Both axillae are benign.  LAB RESULTS:  CMP     Component Value Date/Time   NA 140 08/27/2016 1428   K 3.8 08/27/2016 1428   CL 102 06/29/2015 0923   CO2 27 08/27/2016 1428   GLUCOSE 132 08/27/2016 1428   BUN 21.3 08/27/2016 1428   CREATININE 1.2 (H) 08/27/2016 1428   CALCIUM 10.2 08/27/2016 1428   PROT 7.8 08/27/2016 1428   ALBUMIN 3.5 08/27/2016 1428   AST 19 08/27/2016 1428   ALT 36 08/27/2016 1428   ALKPHOS 117 08/27/2016 1428   BILITOT 0.22 08/27/2016 1428    GFRNONAA >60 06/29/2015 0923   GFRAA >60 06/29/2015 0923    INo results found for: SPEP, UPEP  Lab Results  Component Value Date   WBC 7.5 08/27/2016   NEUTROABS 4.4 08/27/2016   HGB 13.0 08/27/2016   HCT 38.6 08/27/2016   MCV 100.2 08/27/2016   PLT 207 08/27/2016      Chemistry      Component Value Date/Time   NA 140 08/27/2016 1428   K 3.8 08/27/2016 1428   CL 102 06/29/2015 0923   CO2 27 08/27/2016 1428   BUN 21.3 08/27/2016 1428   CREATININE 1.2 (H)   08/27/2016 1428   GLU 157 (H) 08/17/2016 0818      Component Value Date/Time   CALCIUM 10.2 08/27/2016 1428   ALKPHOS 117 08/27/2016 1428   AST 19 08/27/2016 1428   ALT 36 08/27/2016 1428   BILITOT 0.22 08/27/2016 1428       No results found for: LABCA2  No components found for: LABCA125  No results for input(s): INR in the last 168 hours.  Urinalysis    Component Value Date/Time   COLORURINE YELLOW 06/29/2015 1030   APPEARANCEUR CLOUDY (A) 06/29/2015 1030   LABSPEC 1.019 06/29/2015 1030   PHURINE 7.0 06/29/2015 1030   GLUCOSEU NEGATIVE 06/29/2015 1030   HGBUR TRACE (A) 06/29/2015 1030   BILIRUBINUR NEGATIVE 06/29/2015 1030   KETONESUR NEGATIVE 06/29/2015 1030   PROTEINUR NEGATIVE 06/29/2015 1030   NITRITE NEGATIVE 06/29/2015 1030   LEUKOCYTESUR MODERATE (A) 06/29/2015 1030    STUDIES: EXAM: DG HIP (WITH OR WITHOUT PELVIS) 1V*L*  COMPARISON:  None.  FINDINGS: No fracture or dislocation.  No plain film evidence of osseous metastatic disease or avascular necrosis. If there is persistent or progressive pain and further delineation is clinically desired MR imaging may be considered.  IMPRESSION: Negative plain film exam of the left hip as noted above.   Electronically Signed   By: Genia Del M.D.   On: 05/31/2016 08:22 (About a patient well Richard Cox exemestane patient That you're scheduled for tomorrow located over his hematology there is nothing easier. On Mondays as well as  yellow as is no  ASSESSMENT: 56 y.o. Margaret [name] woman with synchronous breast cancers, as follows  (a) status post right breast upper outer quadrant biopsy 04/19/2015 for a clinical pT1c N0, stage IA invasive ductal carcinoma, grade 1, estrogen receptor positive, progesterone receptor negative, with an MIB-1 of 5%, and no HER-2 amplification  (b) status post left breast upper outer quadrant biopsy 2 and left axillary lymph node biopsy 04/19/2015 for a clinical T3 N1, stage IIIA invasive ductal carcinoma, grade 1 or 2, estrogen receptor and progesterone receptor positive, HER-2 negative, with an MIB-1 between 5 and 10%  (1) status post bilateral mastectomies 05/09/2015, showing:   (a) on the right side, a pT2 pN0, stage IIA invasive ductal carcinoma, with negative margins and repeat HER-2 again negative    (b) on the left, a pT3 pN2, stage IIIA invasive ductal carcinoma, grade 2, with negative margins, and repeat HER-2 again negative  (2) Mammaprint from the left-sided tumor returned "luminal type, low risk", predicting a small benefit from chemotherapy with the important caveat that N2 disease was not included in the MINDACT study--patient opted for chemotherapy  (3) Oncotype from the right-sided tumor showed a score of 12, predicting an 8% risk of recurrence outside the breast within the next 10 years, if the patient's only systemic therapy was tamoxifen for 5 years. It also predicted no significant benefit from chemotherapy  (4) postmastectomy radiation to the left chest wall completed 08/15/2015  (5) adjuvant chemotherapy consisting of cyclophosphamide and docetaxel x4 completed on 11/21/15.  (5) anastrozole started 02/08/2016  (6) left upper extremity lymphedema  (7) signed for B-WELL study 02/08/2016  (8) participating in [place] trial as of August 2017  PLAN: Aryana is now a little more than a year out from definitive surgery for her breast cancer with no evidence of disease  recurrence. This is very favorable.  She is tolerating the anastrozole and palbociclib well. We are going to continue these as before. I will be checking  labs on a monthly basis so far as the palbociclib's concern and she will return to see me in April.  Her social situation is very problematic at present. I have alerted our counselor and social workers to see if they can offer her any assistance.    Chauncey Cruel, MD   08/27/2016 3:01 PM

## 2016-08-28 LAB — HEMOGLOBIN A1C
Est. average glucose Bld gHb Est-mCnc: 177 mg/dL
Hemoglobin A1c: 7.8 % — ABNORMAL HIGH (ref 4.8–5.6)

## 2016-08-29 ENCOUNTER — Encounter: Payer: Self-pay | Admitting: General Practice

## 2016-08-29 NOTE — Progress Notes (Signed)
[place] Spiritual Care Note  Reached [name] by phone per referral from Dr Jana Hakim for emotional support.  Pt reports job loss on 08/27/16 and estranged relationship with SO as key contributors to financial, insurance, and housing distress.  Provided reflective, empathic listening; pt verbalized that it was very helpful to talk through her feelings.  Per pt, at this time her main need from Support Team is contact with LCSW re financial/insurance questions.  Margaret [name]/LCSW is familiar with Margaret [name] and plans to f/u by phone.  Margaret [name] is aware of ongoing Support Team availability, but please also page if additional needs arise.  Thank you.   [place], [place], [facility] Pager [phone] Voicemail [phone]

## 2016-09-04 ENCOUNTER — Encounter: Payer: Self-pay | Admitting: *Deleted

## 2016-09-04 ENCOUNTER — Other Ambulatory Visit: Payer: Self-pay | Admitting: Oncology

## 2016-09-04 DIAGNOSIS — C50412 Malignant neoplasm of upper-outer quadrant of left female breast: Secondary | ICD-10-CM

## 2016-09-04 NOTE — Progress Notes (Signed)
[place] Work  Holiday representative received referral from medical oncologist for assessment of psychosocial needs.  CSW contacted patient at home to offer support and assess for needs.  CSW left patient patient a message offering support and encouraging patient to call back with questions or concerns.    Johnnye Lana, MSW, LCSW, OSW-C Clinical Social Worker [facility] [phone]

## 2016-09-24 ENCOUNTER — Other Ambulatory Visit: Payer: BLUE CROSS/BLUE SHIELD

## 2016-09-25 ENCOUNTER — Telehealth: Payer: Self-pay | Admitting: *Deleted

## 2016-09-25 MED FILL — PANTOPRAZOLE SOD DR 40 MG T: 40 | 30 days supply | Qty: 30 | Fill #0

## 2016-09-25 MED FILL — POTASSIUM CL ER 10 MEQ TAB: 10 | 30 days supply | Qty: 30 | Fill #0

## 2016-09-25 MED FILL — IBUPROFEN 800 MG TABLET: 800 | 10 days supply | Qty: 30 | Fill #0

## 2016-09-25 NOTE — Telephone Encounter (Signed)
09/25/2016 Patient called regarding prior visit where it was noted that she had lost her job. Patient called to report that she was applying for Medicaid so that she can get her medications, but has been told to go through the social worker at the [employer] Message taken and will be forwarded to [facility] for follow-up. Patient can be reached at [phone]. Cindy S. Brigitte Pulse BSN, RN, CCRP 09/25/2016 10:40 AM

## 2016-09-25 NOTE — Telephone Encounter (Signed)
09/25/2016 Message routing to [facility] for follow up. Cindy S. Brigitte Pulse BSN, RN, [place] 09/25/2016 11:39 AM

## 2016-09-26 ENCOUNTER — Other Ambulatory Visit: Payer: Self-pay | Admitting: Adult Health

## 2016-09-26 DIAGNOSIS — C50211 Malignant neoplasm of upper-inner quadrant of right female breast: Secondary | ICD-10-CM

## 2016-09-26 DIAGNOSIS — Z170 Estrogen receptor positive status [ER+]: Principal | ICD-10-CM

## 2016-09-26 DIAGNOSIS — C50412 Malignant neoplasm of upper-outer quadrant of left female breast: Secondary | ICD-10-CM

## 2016-09-27 ENCOUNTER — Other Ambulatory Visit: Payer: Self-pay | Admitting: *Deleted

## 2016-09-27 ENCOUNTER — Other Ambulatory Visit (HOSPITAL_BASED_OUTPATIENT_CLINIC_OR_DEPARTMENT_OTHER): Payer: Self-pay

## 2016-09-27 DIAGNOSIS — Z170 Estrogen receptor positive status [ER+]: Principal | ICD-10-CM

## 2016-09-27 DIAGNOSIS — C50211 Malignant neoplasm of upper-inner quadrant of right female breast: Secondary | ICD-10-CM

## 2016-09-27 DIAGNOSIS — C50412 Malignant neoplasm of upper-outer quadrant of left female breast: Secondary | ICD-10-CM

## 2016-09-27 DIAGNOSIS — C50411 Malignant neoplasm of upper-outer quadrant of right female breast: Secondary | ICD-10-CM

## 2016-09-27 LAB — CBC WITH DIFFERENTIAL/PLATELET
BASO%: 1.2 % (ref 0.0–2.0)
Basophils Absolute: 0.1 10*3/uL (ref 0.0–0.1)
EOS%: 1.4 % (ref 0.0–7.0)
Eosinophils Absolute: 0.1 10*3/uL (ref 0.0–0.5)
HCT: 41.3 % (ref 34.8–46.6)
HGB: 14 g/dL (ref 11.6–15.9)
LYMPH%: 35.2 % (ref 14.0–49.7)
MCH: 33.3 pg (ref 25.1–34.0)
MCHC: 33.9 g/dL (ref 31.5–36.0)
MCV: 98.3 fL (ref 79.5–101.0)
MONO#: 0.6 10*3/uL (ref 0.1–0.9)
MONO%: 11.1 % (ref 0.0–14.0)
NEUT#: 3 10*3/uL (ref 1.5–6.5)
NEUT%: 51.1 % (ref 38.4–76.8)
Platelets: 232 10*3/uL (ref 145–400)
RBC: 4.2 10*6/uL (ref 3.70–5.45)
RDW: 13.7 % (ref 11.2–14.5)
WBC: 5.8 10*3/uL (ref 3.9–10.3)
lymph#: 2 10*3/uL (ref 0.9–3.3)

## 2016-09-27 LAB — COMPREHENSIVE METABOLIC PANEL
ALT: 31 U/L (ref 0–55)
AST: 21 U/L (ref 5–34)
Albumin: 4 g/dL (ref 3.5–5.0)
Alkaline Phosphatase: 96 U/L (ref 40–150)
Anion Gap: 10 mEq/L (ref 3–11)
BUN: 14.7 mg/dL (ref 7.0–26.0)
CO2: 29 mEq/L (ref 22–29)
Calcium: 10.2 mg/dL (ref 8.4–10.4)
Chloride: 104 mEq/L (ref 98–109)
Creatinine: 1.1 mg/dL (ref 0.6–1.1)
EGFR: 65 mL/min/{1.73_m2} — ABNORMAL LOW (ref >90)
Glucose: 107 mg/dl (ref 70–140)
Potassium: 3.5 mEq/L (ref 3.5–5.1)
Sodium: 143 mEq/L (ref 136–145)
Total Bilirubin: 0.24 mg/dL (ref 0.20–1.20)
Total Protein: 8.8 g/dL — ABNORMAL HIGH (ref 6.4–8.3)

## 2016-09-27 MED FILL — LOSARTAN-HCTZ 100-25 MG TAB: 100-25 | 90 days supply | Qty: 90 | Fill #0

## 2016-09-27 MED FILL — GABAPENTIN 300 MG CAPSULE: 300 | 30 days supply | Qty: 180 | Fill #0

## 2016-09-27 MED FILL — ALPRAZolam 1 MG TABS: 1 | 30 days supply | Qty: 60 | Fill #0

## 2016-10-22 ENCOUNTER — Other Ambulatory Visit: Payer: BLUE CROSS/BLUE SHIELD

## 2016-10-23 MED FILL — GABAPENTIN 300 MG CAPSULE: 300 | 30 days supply | Qty: 180 | Fill #1

## 2016-10-24 ENCOUNTER — Other Ambulatory Visit: Payer: Self-pay | Admitting: Adult Health

## 2016-10-24 DIAGNOSIS — C50211 Malignant neoplasm of upper-inner quadrant of right female breast: Secondary | ICD-10-CM

## 2016-10-24 DIAGNOSIS — Z170 Estrogen receptor positive status [ER+]: Principal | ICD-10-CM

## 2016-10-25 ENCOUNTER — Other Ambulatory Visit: Payer: Self-pay | Admitting: *Deleted

## 2016-10-25 ENCOUNTER — Other Ambulatory Visit (HOSPITAL_BASED_OUTPATIENT_CLINIC_OR_DEPARTMENT_OTHER): Payer: Self-pay

## 2016-10-25 ENCOUNTER — Encounter: Payer: Self-pay | Admitting: *Deleted

## 2016-10-25 DIAGNOSIS — C50211 Malignant neoplasm of upper-inner quadrant of right female breast: Secondary | ICD-10-CM

## 2016-10-25 DIAGNOSIS — Z170 Estrogen receptor positive status [ER+]: Principal | ICD-10-CM

## 2016-10-25 DIAGNOSIS — G629 Polyneuropathy, unspecified: Secondary | ICD-10-CM

## 2016-10-25 DIAGNOSIS — C50411 Malignant neoplasm of upper-outer quadrant of right female breast: Secondary | ICD-10-CM

## 2016-10-25 DIAGNOSIS — C50012 Malignant neoplasm of nipple and areola, left female breast: Secondary | ICD-10-CM

## 2016-10-25 DIAGNOSIS — C50412 Malignant neoplasm of upper-outer quadrant of left female breast: Secondary | ICD-10-CM

## 2016-10-25 DIAGNOSIS — C50011 Malignant neoplasm of nipple and areola, right female breast: Secondary | ICD-10-CM

## 2016-10-25 LAB — CBC WITH DIFFERENTIAL/PLATELET
BASO%: 1.4 % (ref 0.0–2.0)
Basophils Absolute: 0.1 10*3/uL (ref 0.0–0.1)
EOS%: 1.7 % (ref 0.0–7.0)
Eosinophils Absolute: 0.1 10*3/uL (ref 0.0–0.5)
HCT: 42.1 % (ref 34.8–46.6)
HGB: 14.3 g/dL (ref 11.6–15.9)
LYMPH%: 33.1 % (ref 14.0–49.7)
MCH: 33.4 pg (ref 25.1–34.0)
MCHC: 34 g/dL (ref 31.5–36.0)
MCV: 98.4 fL (ref 79.5–101.0)
MONO#: 0.8 10*3/uL (ref 0.1–0.9)
MONO%: 11 % (ref 0.0–14.0)
NEUT#: 3.7 10*3/uL (ref 1.5–6.5)
NEUT%: 52.8 % (ref 38.4–76.8)
Platelets: 220 10*3/uL (ref 145–400)
RBC: 4.28 10*6/uL (ref 3.70–5.45)
RDW: 14 % (ref 11.2–14.5)
WBC: 7.1 10*3/uL (ref 3.9–10.3)
lymph#: 2.3 10*3/uL (ref 0.9–3.3)

## 2016-10-25 LAB — COMPREHENSIVE METABOLIC PANEL
ALT: 53 U/L (ref 0–55)
AST: 60 U/L — ABNORMAL HIGH (ref 5–34)
Albumin: 4 g/dL (ref 3.5–5.0)
Alkaline Phosphatase: 88 U/L (ref 40–150)
Anion Gap: 13 mEq/L — ABNORMAL HIGH (ref 3–11)
BUN: 14.9 mg/dL (ref 7.0–26.0)
CO2: 27 mEq/L (ref 22–29)
Calcium: 10.1 mg/dL (ref 8.4–10.4)
Chloride: 100 mEq/L (ref 98–109)
Creatinine: 1.1 mg/dL (ref 0.6–1.1)
EGFR: 63 mL/min/{1.73_m2} — ABNORMAL LOW (ref >90)
Glucose: 168 mg/dl — ABNORMAL HIGH (ref 70–140)
Potassium: 3.2 mEq/L — ABNORMAL LOW (ref 3.5–5.1)
Sodium: 140 mEq/L (ref 136–145)
Total Bilirubin: 0.42 mg/dL (ref 0.20–1.20)
Total Protein: 8.7 g/dL — ABNORMAL HIGH (ref 6.4–8.3)

## 2016-10-25 MED ORDER — ANASTROZOLE 1 MG PO TABS: 1.0000 mg | ORAL_TABLET | Freq: Every day | ORAL | 4 refills | Status: DC

## 2016-10-25 MED ORDER — GABAPENTIN 300 MG PO CAPS: 600.0000 mg | ORAL_CAPSULE | Freq: Three times a day (TID) | ORAL | 1 refills | Status: DC

## 2016-10-25 MED ORDER — PANTOPRAZOLE SODIUM 40 MG PO TBEC: 40.0000 mg | DELAYED_RELEASE_TABLET | Freq: Every day | ORAL | 1 refills | Status: DC

## 2016-10-25 MED ORDER — ALPRAZOLAM 1 MG PO TABS: 1.0000 mg | ORAL_TABLET | Freq: Two times a day (BID) | ORAL | 1 refills | Status: DC | PRN

## 2016-10-25 MED ORDER — VENLAFAXINE HCL ER 150 MG PO CP24: 150.0000 mg | ORAL_CAPSULE | Freq: Every day | ORAL | 1 refills | Status: DC

## 2016-10-25 MED FILL — VENLAFAXINE HCL ER 150 MG C: 150 | 90 days supply | Qty: 90 | Fill #0

## 2016-10-25 MED FILL — ANASTROZOLE 1 MG TABLET: 1 | 90 days supply | Qty: 90 | Fill #0

## 2016-10-25 MED FILL — PANTOPRAZOLE SOD DR 40 MG T: 40 | 30 days supply | Qty: 30 | Fill #0

## 2016-10-25 NOTE — Progress Notes (Signed)
10/25/2016 Patient in to clinic today for interim monthly lab tests, per MD discretion. Patient seen in lobby, where she had returned empty palbociclib bottles for Cycles 6 & 7, along with completed pill diaries for those cycles. She confirmed that she started Cycle 8 dosing on Monday. Pill bottles were returned to pharmacy for drug accountability by pharmacist, Carolynne Edouard. Patient was told that she will receive a reminder call prior to her April visit, reminding her to return remaining palbociclib bottle and pill diaries. Cindy S. Brigitte Pulse BSN, RN, [place] 10/25/2016 2:24 PM

## 2016-10-27 ENCOUNTER — Encounter: Payer: Self-pay | Admitting: Oncology

## 2016-10-27 NOTE — Progress Notes (Signed)
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 She has been receiving medical care through the emergency room and was seen in March 2015 following a head injury, then in August 2015 for lancing of an abscess. In December 2015 she noticed that her left breast looked a little bit smaller than her right. She brought this to the attention of friends and family over the next several months but the general feeling was that most people are not perfectly symmetrical. By the summer of this year she noticed that her nipple on the left was "going in". More recently she started developing pain in the left breast. She was evaluated for this in the emergency room 04/09/2015 at which time a left breast exam showed a deformed left breast with a large hard mass encompassing most of the breast, with nipple retraction. There was no nipple discharge or bleeding and no palpable adenopathy.  The patient was referred to [facility] and on 04/13/2015 she underwent bilateral diagnostic mammography with tomosynthesis as well as bilateral breast ultrasound. The breast density  was category B. In the right breast at the 11:00 position there was a 1.3 cm irregular mass which by ultrasonography measured 1.3 cm.--In the left breast there was a 4 cm irregular mass at the 2:00 position associated with nipple retraction. There was a second, 1.7 cm area of architectural distortion at the 9:00 position. Both were palpable. By ultrasonography, the 4 cm irregular mass was noted, with a second mass measuring 2.5 cm by ultrasonography. Both axillae were benign.  On 04/19/2015 the patient underwent right breast upper outer quadrant biopsy, showing ([id number]) and invasive ductal carcinoma, grade 1, estrogen receptor 90% positive, progesterone receptor negative, with an MIB-1 of 5%, and no HER-2 amplification, the signals ratio being 1.26 and the number per cell 2.20.  On the same day, she underwent biopsy of the 2 left breast masses in question as well as a suspicious left axillary lymph node. All 3 biopsies showed invasive ductal carcinoma, grade 2, estrogen receptor 80-100% positive, progesterone receptor 80-100% positive, with the MIB-1 between 5 and 10%, and no HER-2 amplification, the signals ratio being between 1.05 and 1.13, and the number per cell between 2.10 and 2.25.  The patient's subsequent history is as detailed below  INTERVAL HISTORY: Margaret [name] returns today for follow-up of her estrogen receptor positive cancers. The interval history has been disastrous. She was let go up her job and her husband has moved out. She is home alone. She is very depressed.  From a breast cancer point of view she is doing remarkably well. She is tolerating the anastrozole well.  Hot flashes and vaginal dryness are not a major issue. She never developed the arthralgias or myalgias  that many patients can experience on this medication. She obtains it at a good price.  She is about to start her current cycle of palbociclib. We have not had to reduce her dose and there have been no dose delays so far.  She is tolerating this equally well.   REVIEW OF SYSTEMS: Margaret [name] continues to have left hip pain. We evaluated this last visit and was nothing we could find by plain films. She also has soreness and pain in the left chest wall area, which is of course the main surgical site that was irradiated. Neither of these problems suggest recurrence of disease. She has very depressed given the recent events just noted. A detailed review of systems was otherwise stable.  PAST MEDICAL HISTORY: Past Medical History:  Diagnosis Date  . Bilateral breast cancer (HCC)   . Breast cancer (HCC)   . Breast cancer of upper-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) 04/21/2015  . GERD (gastroesophageal reflux disease)   . Hypertension     PAST SURGICAL HISTORY: Past Surgical History:  Procedure Laterality Date  . ABDOMINAL HYSTERECTOMY  1990  . MASTECTOMY MODIFIED RADICAL Left 05/09/2015  . MASTECTOMY MODIFIED RADICAL Left 05/09/2015   Procedure: LEFT MODIFIED RADICAL MASTETCTOMY;  Surgeon: Paul Toth III, MD;  Location: [facility] OR;  Service: General;  Laterality: Left;  . MASTECTOMY W/ SENTINEL NODE BIOPSY Right   . MASTECTOMY W/ SENTINEL NODE BIOPSY Right 05/09/2015   Procedure: RIGHT MASTECTOMY WITH RIGHT AXILLARY SENTINEL LYMPH NODE BIOPSY;  Surgeon: Paul Toth III, MD;  Location: [facility] OR;  Service: General;  Laterality: Right;  . PORTACATH PLACEMENT Right 09/01/2015   Procedure: INSERTION PORT-A-CATH;  Surgeon: Paul Toth III, MD;  Location: [facility];  Service: General;  Laterality: Right;  . TUBAL LIGATION  1984    FAMILY HISTORY Family History  Problem Relation Age of Onset  . Hypertension Mother   . Diabetes Mother     the patient has little information on her father. Her mother is living, currently age 74. She has one brother, 2 sisters. There is no history of breast or ovarian cancer in the family to her knowledge.   GYNECOLOGIC HISTORY:  No LMP recorded. Patient has had a hysterectomy.   menarche age 11, first live birth age 18. She is GX P4. She underwent a total abdominal hysterectomy with bilateral salpingo-oophorectomy at age 27. She did not take hormone replacement. She never used oral contraceptives.   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 she is currently unemployed. She is single and lives alone  The patient's daughter  Raniesha Williams works as a home health aide, as does her daughter  Aliciiea Alila. Daughter Shawanda Bee works at [employer], as a call. All 3 live in [place]. The patient's son Lonnie Trumpower's lives in [place]. He prepares sets for shows The patient has 4 grandchildren, no great grandchildren. She attends a [employer] in [place].     ADVANCED DIRECTIVES: Not in place. At the initial clinic visit the patient was given the appropriate forms to complete and notarize at her discretion. The patient intends to name her daughter Raniesh Williams as healthcare power of attorney. She can be reached at [phone].  HEALTH MAINTENANCE: Social History  Substance Use Topics  . Smoking status: Former Smoker    Packs/day: 0.10    Years: 28.00  . Smokeless tobacco: Never Used  .   Alcohol use No     Colonoscopy: Never  PAP: Status post hysterectomy  Bone density: Never  Lipid panel:  Allergies  Allergen Reactions  . Latex Itching and Other (See Comments)    burning  . 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 Wheat Bran Hives    Current Outpatient Prescriptions  Medication Sig Dispense Refill  . ALPRAZolam (XANAX) 1 MG tablet Take 1 tablet (1 mg total) by mouth 2 (two) times daily as needed for anxiety. 60 tablet 1  . anastrozole (ARIMIDEX) 1 MG tablet Take 1 tablet (1 mg total) by mouth daily. 90 tablet 4  . gabapentin (NEURONTIN) 300 MG capsule Take 2 capsules (600 mg total) by mouth 3 (three) times daily. 180 capsule 1  .  ibuprofen (ADVIL,MOTRIN) 800 MG tablet TAKE 1 TABLET (800 MG TOTAL) BY MOUTH EVERY 8 (EIGHT) HOURS AS NEEDED. 30 tablet 0  . Investigational palbociclib (IBRANCE) 125 MG capsule [facility] AFT-05 PALLAS Take 1 capsule (125 mg total) by mouth daily. Take with food. Swallow whole. Do not chew. Take on days 1-21. Repeat every 28 days. 63 capsule 0  . losartan-hydrochlorothiazide (HYZAAR) 100-25 MG tablet Take 1 tablet by mouth daily. 90 tablet 3  . pantoprazole (PROTONIX) 40 MG tablet Take 1 tablet (40 mg total) by mouth daily. 30 tablet 1  . venlafaxine XR (EFFEXOR-XR) 150 MG 24 hr capsule Take 1 capsule (150 mg total) by mouth daily with breakfast. 90 capsule 1   No current facility-administered medications for this visit.       OBJECTIVE: Middle-aged African-American [name] Who was tearful during today's visit There were no vitals filed for this visit.   There is no height or weight on file to calculate BMI.    ECOG FS:2 - Symptomatic, <50% confined to bed   Sclerae unicteric, EOMs intact Oropharynx clear and moist No cervical or supraclavicular adenopathy Lungs no rales or rhonchi Heart regular rate and rhythm Abd soft, nontender, positive bowel sounds MSK no focal spinal tenderness, no upper extremity lymphedema Neuro: nonfocal, well oriented, appropriate affect Breasts: Status post bilateral mastectomies and left sided irradiation. There is no evidence of local recurrence. Both axillae are benign.  LAB RESULTS:  CMP     Component Value Date/Time   NA 140 10/25/2016 1138   K 3.2 (L) 10/25/2016 1138   CL 102 06/29/2015 0923   CO2 27 10/25/2016 1138   GLUCOSE 168 (H) 10/25/2016 1138   BUN 14.9 10/25/2016 1138   CREATININE 1.1 10/25/2016 1138   CALCIUM 10.1 10/25/2016 1138   PROT 8.7 (H) 10/25/2016 1138   ALBUMIN 4.0 10/25/2016 1138   AST 60 (H) 10/25/2016 1138   ALT 53 10/25/2016 1138   ALKPHOS 88 10/25/2016 1138   BILITOT 0.42 10/25/2016 1138   GFRNONAA >60  06/29/2015 0923   GFRAA >60 06/29/2015 0923    INo results found for: SPEP, UPEP  Lab Results  Component Value Date   WBC 7.1 10/25/2016   NEUTROABS 3.7 10/25/2016   HGB 14.3 10/25/2016   HCT 42.1 10/25/2016   MCV 98.4 10/25/2016   PLT 220 10/25/2016      Chemistry      Component Value Date/Time   NA 140 10/25/2016 1138   K 3.2 (L) 10/25/2016 1138   CL 102 06/29/2015 0923   CO2 27 10/25/2016 1138   BUN 14.9 10/25/2016 1138   CREATININE 1.1 10/25/2016 1138   GLU 157 (H) 08/17/2016  0818      Component Value Date/Time   CALCIUM 10.1 10/25/2016 1138   ALKPHOS 88 10/25/2016 1138   AST 60 (H) 10/25/2016 1138   ALT 53 10/25/2016 1138   BILITOT 0.42 10/25/2016 1138       No results found for: LABCA2  No components found for: LABCA125  No results for input(s): INR in the last 168 hours.  Urinalysis    Component Value Date/Time   COLORURINE YELLOW 06/29/2015 1030   APPEARANCEUR CLOUDY (A) 06/29/2015 1030   LABSPEC 1.019 06/29/2015 1030   PHURINE 7.0 06/29/2015 1030   GLUCOSEU NEGATIVE 06/29/2015 1030   HGBUR TRACE (A) 06/29/2015 1030   BILIRUBINUR NEGATIVE 06/29/2015 1030   KETONESUR NEGATIVE 06/29/2015 1030   PROTEINUR NEGATIVE 06/29/2015 1030   NITRITE NEGATIVE 06/29/2015 1030   LEUKOCYTESUR MODERATE (A) 06/29/2015 1030    STUDIES: EXAM: DG HIP (WITH OR WITHOUT PELVIS) 1V*L*  COMPARISON:  None.  FINDINGS: No fracture or dislocation.  No plain film evidence of osseous metastatic disease or avascular necrosis. If there is persistent or progressive pain and further delineation is clinically desired MR imaging may be considered.  IMPRESSION: Negative plain film exam of the left hip as noted above.   Electronically Signed   By: Genia Del M.D.   On: 05/31/2016 08:22 (About a patient well Richard Cox exemestane patient That you're scheduled for tomorrow located over his hematology there is nothing easier. On Mondays as well as yellow as is  no  ASSESSMENT: 56 y.o. Margaret [name] with synchronous breast cancers, as follows  (a) status post right breast upper outer quadrant biopsy 04/19/2015 for a clinical pT1c N0, stage IA invasive ductal carcinoma, grade 1, estrogen receptor positive, progesterone receptor negative, with an MIB-1 of 5%, and no HER-2 amplification  (b) status post left breast upper outer quadrant biopsy 2 and left axillary lymph node biopsy 04/19/2015 for a clinical T3 N1, stage IIIA invasive ductal carcinoma, grade 1 or 2, estrogen receptor and progesterone receptor positive, HER-2 negative, with an MIB-1 between 5 and 10%  (1) status post bilateral mastectomies 05/09/2015, showing:   (a) on the right side, a pT2 pN0, stage IIA invasive ductal carcinoma, with negative margins and repeat HER-2 again negative    (b) on the left, a pT3 pN2, stage IIIA invasive ductal carcinoma, grade 2, with negative margins, and repeat HER-2 again negative  (2) Mammaprint from the left-sided tumor returned "luminal type, low risk", predicting a small benefit from chemotherapy with the important caveat that N2 disease was not included in the MINDACT study--patient opted for chemotherapy  (3) Oncotype from the right-sided tumor showed a score of 12, predicting an 8% risk of recurrence outside the breast within the next 10 years, if the patient's only systemic therapy was tamoxifen for 5 years. It also predicted no significant benefit from chemotherapy  (4) postmastectomy radiation to the left chest wall completed 08/15/2015  (5) adjuvant chemotherapy consisting of cyclophosphamide and docetaxel x4 completed on 11/21/15.  (5) anastrozole started 02/08/2016  (6) left upper extremity lymphedema  (7) signed for B-WELL study 02/08/2016  (8) participating in [place] trial as of August 2017  PLAN: Kennadie is now a little more than a year out from definitive surgery for her breast cancer with no evidence of disease recurrence. This is  very favorable.  She is tolerating the anastrozole and palbociclib well. We are going to continue these as before. I will be checking labs on a monthly basis so far  as the palbociclib's concern and she will return to see me in April.  Her social situation is very problematic at present. I have alerted our counselor and social workers to see if they can offer her any assistance.    Chauncey Cruel, MD   10/27/2016 3:43 PM

## 2016-11-15 ENCOUNTER — Other Ambulatory Visit: Payer: Self-pay | Admitting: Oncology

## 2016-11-15 ENCOUNTER — Telehealth: Payer: Self-pay

## 2016-11-15 MED FILL — IBUPROFEN 800 MG TABLET: 800 | 10 days supply | Qty: 30 | Fill #0

## 2016-11-15 MED FILL — ALPRAZolam 1 MG TABS: 1 | 30 days supply | Qty: 60 | Fill #1

## 2016-11-15 MED FILL — POTASSIUM CL ER 10 MEQ TAB: 10 | 30 days supply | Qty: 30 | Fill #1

## 2016-11-15 NOTE — Telephone Encounter (Signed)
Patient was called and a voicemail was left with her appt date and time for 11/19/16 12:45/1:15 and to bring the following information: a. Cycle 8 - palbociclib pill bottle and both diaries (palbociclib and anastrozole) b. Cycle 5 - anastrozole diary. She forgot to bring it in January, so please ask her if she can locate it and bring it in. I already have the Cycle 5 palbociclib diary.  Webb Silversmith

## 2016-11-19 ENCOUNTER — Encounter: Payer: Self-pay | Admitting: *Deleted

## 2016-11-19 ENCOUNTER — Other Ambulatory Visit (HOSPITAL_BASED_OUTPATIENT_CLINIC_OR_DEPARTMENT_OTHER): Payer: Self-pay

## 2016-11-19 ENCOUNTER — Ambulatory Visit (HOSPITAL_BASED_OUTPATIENT_CLINIC_OR_DEPARTMENT_OTHER): Payer: Self-pay | Admitting: Oncology

## 2016-11-19 VITALS — BP 138/72 | HR 95 | Temp 98.3°F | Wt 289.2 lb

## 2016-11-19 DIAGNOSIS — Z006 Encounter for examination for normal comparison and control in clinical research program: Secondary | ICD-10-CM

## 2016-11-19 DIAGNOSIS — C50412 Malignant neoplasm of upper-outer quadrant of left female breast: Secondary | ICD-10-CM

## 2016-11-19 DIAGNOSIS — C50012 Malignant neoplasm of nipple and areola, left female breast: Secondary | ICD-10-CM

## 2016-11-19 DIAGNOSIS — Z170 Estrogen receptor positive status [ER+]: Principal | ICD-10-CM

## 2016-11-19 DIAGNOSIS — G629 Polyneuropathy, unspecified: Secondary | ICD-10-CM

## 2016-11-19 DIAGNOSIS — C50411 Malignant neoplasm of upper-outer quadrant of right female breast: Secondary | ICD-10-CM

## 2016-11-19 DIAGNOSIS — C50211 Malignant neoplasm of upper-inner quadrant of right female breast: Secondary | ICD-10-CM

## 2016-11-19 DIAGNOSIS — Z79811 Long term (current) use of aromatase inhibitors: Secondary | ICD-10-CM

## 2016-11-19 DIAGNOSIS — C50011 Malignant neoplasm of nipple and areola, right female breast: Secondary | ICD-10-CM

## 2016-11-19 DIAGNOSIS — N898 Other specified noninflammatory disorders of vagina: Secondary | ICD-10-CM

## 2016-11-19 DIAGNOSIS — C50812 Malignant neoplasm of overlapping sites of left female breast: Secondary | ICD-10-CM

## 2016-11-19 DIAGNOSIS — G620 Drug-induced polyneuropathy: Secondary | ICD-10-CM

## 2016-11-19 LAB — COMPREHENSIVE METABOLIC PANEL
ALT: 31 U/L (ref 0–55)
AST: 22 U/L (ref 5–34)
Albumin: 3.8 g/dL (ref 3.5–5.0)
Alkaline Phosphatase: 95 U/L (ref 40–150)
Anion Gap: 10 mEq/L (ref 3–11)
BUN: 14.2 mg/dL (ref 7.0–26.0)
CO2: 32 mEq/L — ABNORMAL HIGH (ref 22–29)
Calcium: 10.1 mg/dL (ref 8.4–10.4)
Chloride: 100 mEq/L (ref 98–109)
Creatinine: 0.9 mg/dL (ref 0.6–1.1)
EGFR: 88 mL/min/{1.73_m2} — ABNORMAL LOW (ref >90)
Glucose: 141 mg/dl — ABNORMAL HIGH (ref 70–140)
Potassium: 3.1 mEq/L — ABNORMAL LOW (ref 3.5–5.1)
Sodium: 142 mEq/L (ref 136–145)
Total Bilirubin: 0.31 mg/dL (ref 0.20–1.20)
Total Protein: 8.2 g/dL (ref 6.4–8.3)

## 2016-11-19 LAB — CBC WITH DIFFERENTIAL/PLATELET
BASO%: 2.1 % — ABNORMAL HIGH (ref 0.0–2.0)
Basophils Absolute: 0.1 10*3/uL (ref 0.0–0.1)
EOS%: 1.8 % (ref 0.0–7.0)
Eosinophils Absolute: 0.1 10*3/uL (ref 0.0–0.5)
HCT: 42 % (ref 34.8–46.6)
HGB: 14.2 g/dL (ref 11.6–15.9)
LYMPH%: 30.2 % (ref 14.0–49.7)
MCH: 33.6 pg (ref 25.1–34.0)
MCHC: 33.8 g/dL (ref 31.5–36.0)
MCV: 99.6 fL (ref 79.5–101.0)
MONO#: 0.6 10*3/uL (ref 0.1–0.9)
MONO%: 10.1 % (ref 0.0–14.0)
NEUT#: 3.4 10*3/uL (ref 1.5–6.5)
NEUT%: 55.8 % (ref 38.4–76.8)
Platelets: 181 10*3/uL (ref 145–400)
RBC: 4.22 10*6/uL (ref 3.70–5.45)
RDW: 14.6 % — ABNORMAL HIGH (ref 11.2–14.5)
WBC: 6.1 10*3/uL (ref 3.9–10.3)
lymph#: 1.8 10*3/uL (ref 0.9–3.3)

## 2016-11-19 MED ORDER — VALACYCLOVIR HCL 1 G PO TABS: 1000.0000 mg | ORAL_TABLET | Freq: Two times a day (BID) | ORAL | 4 refills | Status: DC

## 2016-11-19 MED ORDER — INV-PALBOCICLIB 125 MG CAPS #23 ALLIANCE FOUNDATION AFT-05 (PALLAS): 125.0000 mg | ORAL_CAPSULE | Freq: Every day | ORAL | 0 refills | Status: DC

## 2016-11-19 MED ORDER — POTASSIUM CHLORIDE ER 10 MEQ PO CPCR: 10.0000 meq | ORAL_CAPSULE | Freq: Two times a day (BID) | ORAL | 4 refills | Status: DC

## 2016-11-19 MED ORDER — GABAPENTIN 300 MG PO CAPS: 600.0000 mg | ORAL_CAPSULE | Freq: Three times a day (TID) | ORAL | 1 refills | Status: DC
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
## 2016-11-27 ENCOUNTER — Other Ambulatory Visit: Payer: Self-pay | Admitting: *Deleted

## 2016-11-27 MED ORDER — VALACYCLOVIR HCL 1 G PO TABS: 1000.0000 mg | ORAL_TABLET | Freq: Two times a day (BID) | ORAL | 4 refills | Status: DC

## 2016-11-27 MED ORDER — POTASSIUM CHLORIDE ER 10 MEQ PO CPCR: 10.0000 meq | ORAL_CAPSULE | Freq: Two times a day (BID) | ORAL | 4 refills | Status: DC

## 2016-11-27 MED FILL — valACYclovir HCL 1 GM TABS: 1 | 45 days supply | Qty: 90 | Fill #0

## 2016-12-06 ENCOUNTER — Ambulatory Visit: Payer: Self-pay | Attending: Oncology | Admitting: Physical Therapy

## 2016-12-06 DIAGNOSIS — M6281 Muscle weakness (generalized): Secondary | ICD-10-CM | POA: Insufficient documentation

## 2016-12-06 DIAGNOSIS — M25552 Pain in left hip: Secondary | ICD-10-CM | POA: Insufficient documentation

## 2016-12-06 DIAGNOSIS — R278 Other lack of coordination: Secondary | ICD-10-CM | POA: Insufficient documentation

## 2016-12-10 ENCOUNTER — Other Ambulatory Visit: Payer: Self-pay

## 2016-12-10 DIAGNOSIS — C50412 Malignant neoplasm of upper-outer quadrant of left female breast: Secondary | ICD-10-CM

## 2016-12-10 DIAGNOSIS — C50211 Malignant neoplasm of upper-inner quadrant of right female breast: Secondary | ICD-10-CM

## 2016-12-10 MED ORDER — VENLAFAXINE HCL ER 150 MG PO CP24: 150.0000 mg | ORAL_CAPSULE | Freq: Every day | ORAL | 1 refills | Status: DC

## 2016-12-10 NOTE — Progress Notes (Signed)
Encounter re-entered to allow MD to cosign AE's w/attributions.

## 2016-12-13 ENCOUNTER — Other Ambulatory Visit: Payer: Self-pay | Admitting: Oncology

## 2016-12-13 MED FILL — PANTOPRAZOLE SOD DR 40 MG T: 40 | 30 days supply | Qty: 30 | Fill #1

## 2016-12-13 MED FILL — IBUPROFEN 800 MG TABLET: 800 | 10 days supply | Qty: 30 | Fill #0

## 2016-12-13 MED FILL — GABAPENTIN 300 MG CAPSULE: 300 | 30 days supply | Qty: 180 | Fill #0

## 2016-12-19 ENCOUNTER — Telehealth: Payer: Self-pay

## 2016-12-19 MED FILL — ALPRAZolam 1 MG TABS: 1 | 30 days supply | Qty: 60 | Fill #0

## 2016-12-19 NOTE — Telephone Encounter (Signed)
Called patient and reminded her of her appt on 12/20/16.  She is also aware to bring in her pill bottles and diaries.

## 2016-12-20 ENCOUNTER — Encounter: Payer: Self-pay | Admitting: *Deleted

## 2016-12-20 ENCOUNTER — Ambulatory Visit (HOSPITAL_BASED_OUTPATIENT_CLINIC_OR_DEPARTMENT_OTHER): Payer: Self-pay | Admitting: Adult Health

## 2016-12-20 ENCOUNTER — Other Ambulatory Visit (HOSPITAL_BASED_OUTPATIENT_CLINIC_OR_DEPARTMENT_OTHER): Payer: Self-pay

## 2016-12-20 ENCOUNTER — Encounter: Payer: Self-pay | Admitting: Physical Therapy

## 2016-12-20 ENCOUNTER — Encounter: Payer: Self-pay | Admitting: Adult Health

## 2016-12-20 ENCOUNTER — Telehealth: Payer: Self-pay

## 2016-12-20 ENCOUNTER — Ambulatory Visit: Payer: Self-pay | Admitting: Physical Therapy

## 2016-12-20 VITALS — BP 160/92 | HR 98 | Temp 98.1°F | Resp 19 | Ht 69.0 in | Wt 291.6 lb

## 2016-12-20 DIAGNOSIS — M6281 Muscle weakness (generalized): Secondary | ICD-10-CM

## 2016-12-20 DIAGNOSIS — G4720 Circadian rhythm sleep disorder, unspecified type: Secondary | ICD-10-CM

## 2016-12-20 DIAGNOSIS — Z170 Estrogen receptor positive status [ER+]: Principal | ICD-10-CM

## 2016-12-20 DIAGNOSIS — C50411 Malignant neoplasm of upper-outer quadrant of right female breast: Secondary | ICD-10-CM

## 2016-12-20 DIAGNOSIS — C50812 Malignant neoplasm of overlapping sites of left female breast: Secondary | ICD-10-CM

## 2016-12-20 DIAGNOSIS — M25552 Pain in left hip: Secondary | ICD-10-CM

## 2016-12-20 DIAGNOSIS — Z79811 Long term (current) use of aromatase inhibitors: Secondary | ICD-10-CM

## 2016-12-20 DIAGNOSIS — R278 Other lack of coordination: Secondary | ICD-10-CM

## 2016-12-20 DIAGNOSIS — N898 Other specified noninflammatory disorders of vagina: Secondary | ICD-10-CM

## 2016-12-20 DIAGNOSIS — C50412 Malignant neoplasm of upper-outer quadrant of left female breast: Secondary | ICD-10-CM

## 2016-12-20 DIAGNOSIS — N644 Mastodynia: Secondary | ICD-10-CM

## 2016-12-20 DIAGNOSIS — C50211 Malignant neoplasm of upper-inner quadrant of right female breast: Secondary | ICD-10-CM

## 2016-12-20 DIAGNOSIS — M25559 Pain in unspecified hip: Secondary | ICD-10-CM

## 2016-12-20 DIAGNOSIS — E876 Hypokalemia: Secondary | ICD-10-CM

## 2016-12-20 LAB — CBC WITH DIFFERENTIAL/PLATELET
BASO%: 1.7 % (ref 0.0–2.0)
Basophils Absolute: 0.1 10*3/uL (ref 0.0–0.1)
EOS%: 1.1 % (ref 0.0–7.0)
Eosinophils Absolute: 0.1 10*3/uL (ref 0.0–0.5)
HCT: 41.4 % (ref 34.8–46.6)
HGB: 14.1 g/dL (ref 11.6–15.9)
LYMPH%: 23 % (ref 14.0–49.7)
MCH: 33.7 pg (ref 25.1–34.0)
MCHC: 33.9 g/dL (ref 31.5–36.0)
MCV: 99.4 fL (ref 79.5–101.0)
MONO#: 0.7 10*3/uL (ref 0.1–0.9)
MONO%: 8.3 % (ref 0.0–14.0)
NEUT#: 5.4 10*3/uL (ref 1.5–6.5)
NEUT%: 65.9 % (ref 38.4–76.8)
Platelets: 214 10*3/uL (ref 145–400)
RBC: 4.17 10*6/uL (ref 3.70–5.45)
RDW: 15.4 % — ABNORMAL HIGH (ref 11.2–14.5)
WBC: 8.2 10*3/uL (ref 3.9–10.3)
lymph#: 1.9 10*3/uL (ref 0.9–3.3)

## 2016-12-20 LAB — COMPREHENSIVE METABOLIC PANEL
ALT: 24 U/L (ref 0–55)
AST: 18 U/L (ref 5–34)
Albumin: 3.8 g/dL (ref 3.5–5.0)
Alkaline Phosphatase: 92 U/L (ref 40–150)
Anion Gap: 10 mEq/L (ref 3–11)
BUN: 12.6 mg/dL (ref 7.0–26.0)
CO2: 30 mEq/L — ABNORMAL HIGH (ref 22–29)
Calcium: 10 mg/dL (ref 8.4–10.4)
Chloride: 100 mEq/L (ref 98–109)
Creatinine: 1.1 mg/dL (ref 0.6–1.1)
EGFR: 66 mL/min/{1.73_m2} — ABNORMAL LOW (ref >90)
Glucose: 133 mg/dl (ref 70–140)
Potassium: 2.9 mEq/L — CL (ref 3.5–5.1)
Sodium: 140 mEq/L (ref 136–145)
Total Bilirubin: 0.33 mg/dL (ref 0.20–1.20)
Total Protein: 8.3 g/dL (ref 6.4–8.3)

## 2016-12-20 MED ORDER — TRAZODONE HCL 50 MG PO TABS: 50.0000 mg | ORAL_TABLET | Freq: Every day | ORAL | 0 refills | Status: DC

## 2016-12-20 MED ORDER — VENLAFAXINE HCL ER 150 MG PO CP24: 150.0000 mg | ORAL_CAPSULE | Freq: Every day | ORAL | 1 refills | Status: DC

## 2016-12-20 MED ORDER — TRAMADOL HCL 50 MG PO TABS: 50.0000 mg | ORAL_TABLET | Freq: Three times a day (TID) | ORAL | 0 refills | Status: DC | PRN

## 2016-12-20 MED ORDER — POTASSIUM CHLORIDE ER 10 MEQ PO CPCR: 10.0000 meq | ORAL_CAPSULE | Freq: Two times a day (BID) | ORAL | 4 refills | Status: DC

## 2016-12-20 MED FILL — POTASSIUM CL ER 10 MEQ CAP: 10 | 45 days supply | Qty: 90 | Fill #0

## 2016-12-20 MED FILL — traMADol HCL 50 MG TABS: 50 | 20 days supply | Qty: 60 | Fill #0

## 2016-12-20 MED FILL — traZODone HCL 50 MG TABS: 50 | 30 days supply | Qty: 60 | Fill #0

## 2016-12-20 NOTE — Progress Notes (Addendum)
Addendum to 11/19/16 research note: Patient reported her grade 1 peripheral neuropathy which was present at baseline has worsened in her toes. Expressed inability to stand for long periods to physician. Will change grade of AE from grade 1 to grade 2.

## 2016-12-20 NOTE — Telephone Encounter (Signed)
Pt called and lvm that her K+ was not refilled and she was expecting 4 prescriptions and only got one. S/w Charlestine Massed NP and she moved the rx from [facility] to [place] per pt request.  Called pt back and LVM that ultram trazadone, effexor, and potassium rx sent to [place].   S/w pt and she will pick up rx tomorrow. She will take 40 meq K+ tonight and tomorrow. Then follow instructions on Rx.

## 2016-12-20 NOTE — Progress Notes (Signed)
12/20/2016 @ 1500: Patient seen today for routine 1 month follow up per Dr. Jana Hakim (non-trial required visit). Patient has completed cycle #9 of her Palbociclib 125 mg. She reports she started cycle #10 on 12/17/16 as scheduled. She returned only the cycle #9 empty bottle and this was returned to Raul Del, PharmD for accountability. Patient forgot to bring in her cycle #8 bottle,   both diaries for cycle #8, and still has not returned the cycle #5 anti-hormone diary. She insists she still has it in her home somewhere. Provided her with envelope to mail all diaries back or she could bring them on 5/24 when she returns for lab work. Cmet reveals Grade 3 hypokalemia (not symptomatic)-will increase her K+ tonight and tomorrow to 40 meq and begin 10 meq bid on 12/23/16. She will return on 5/24 to recheck chemistry panel. She reports she has been taking her K+ as originally ordered. Grade 3 hypertension has returned )190/92 and 162/100) She confirms she is taking her antihypertensive as ordered. She reports a Grade 2 insomnia-goes to bed between 10-11 pm and reports she often is not able to go to sleep until 4 am. Provider has ordered trazodone at bedtime. Continues to have pain in left chest wall, joints and her left hip--she reports as unchanged in intensity from baseline. Also reports a Grade 1 fatigue that is resolved with rest. Reports no change in dry skin, itching,flu-like symptoms, depression, decreased concentration, vaginal dryness, or neuropathy. She was seen today for her first appointment with physical therapy for pelvic health referral placed by MD on last visit. Per provider, she is stable to continue Palbociclib as scheduled. She was encouraged to speak with MD about her joint pain at next visit to discuss potential brief anti-hormone holiday to see if this resolves her joint pain, however she said that she has had this pain since she received chemotherapy. Currently she has no mouth sores as was  noted on 11/19/16 visit. Mauri Reading Michaelle Copas, BSN, RN Clinical Research Nurse   Adverse Event Log [place] Cycles 9-11: 11/19/2016--12/20/2016 (End of Cycle 9) Event Grade Onset Date Resolved Date Attribution to palbociclib Attribution to anastrozole Treatment Comments  Dry skin Grade 1 04/04/2016 ongoing No Yes    Itching Grade 1 04/04/2016 ongoing No No    Hypokalemia Grade 1 04/12/2016 12/20/2016 No No  Inc to Grade 3  Hypokalemia Grade 3 12/20/2016 ongoing No No  K+ bid   Flu-like symptoms Grade 2 04/19/2016 ongoing Yes No    Joint pain Grade 2 04/19/2016 ongoing No No Tramadol Not limiting ADLs  Elevated glucose,int. Grade 1 05/30/2016 ongoing No No    Hypertension Grade 3 05/30/2016 ongoing No No Hyzaar   Depression Grade 2 07/17/2016 ongoing No No    Decreased concentration Grade 1 07/17/2016 ongoing Yes Yes    Vaginal dryness Grade 2 09/2016 ongoing No Yes Coconut oil, pelvic rehab referral   AST elevation Grade 1 10/25/16 11/19/2016 Yes No    Peripheral neuropathy Grade 2 11/19/2016 ongoing No No    Fatigue Grade 1 12/20/2016 ongoing Yes No  Resolves with rest  Insomnia Grade 2 12/20/16 ongoing No No Trazodone at bedtime

## 2016-12-20 NOTE — Progress Notes (Signed)
[place] 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 She has been receiving medical care through the emergency room and was seen in March 2015 following a head injury, then in August 2015 for lancing of an abscess. In December 2015 she noticed that her left breast looked a little bit smaller than her right. She brought this to the attention of friends and family over the next several months but the general feeling was that most people are not perfectly symmetrical. By the summer of this year she noticed that her nipple on the left was "going in". More recently she started developing pain in the left breast. She was evaluated for this in the emergency room 04/09/2015 at which time a left breast exam showed a deformed left breast with a large hard mass encompassing most of the breast, with nipple retraction. There was no nipple discharge or bleeding and no palpable adenopathy.  The patient was referred to [facility] and on 04/13/2015 she underwent bilateral diagnostic mammography with tomosynthesis as well as bilateral breast ultrasound. The breast density was category B. In the right  breast at the 11:00 position there was a 1.3 cm irregular mass which by ultrasonography measured 1.3 cm.--In the left breast there was a 4 cm irregular mass at the 2:00 position associated with nipple retraction. There was a second, 1.7 cm area of architectural distortion at the 9:00 position. Both were palpable. By ultrasonography, the 4 cm irregular mass was noted, with a second mass measuring 2.5 cm by ultrasonography. Both axillae were benign.  On 04/19/2015 the patient underwent right breast upper outer quadrant biopsy, showing ([id number]) and invasive ductal carcinoma, grade 1, estrogen receptor 90% positive, progesterone receptor negative, with an MIB-1 of 5%, and no HER-2 amplification, the signals ratio being 1.26 and the number per cell 2.20.  On the same day, she underwent biopsy of the 2 left breast masses in question as well as a suspicious left axillary lymph node. All 3 biopsies showed invasive ductal carcinoma, grade 2, estrogen receptor 80-100% positive, progesterone receptor 80-100% positive, with the MIB-1 between 5 and 10%, and no HER-2 amplification, the signals ratio being between 1.05 and 1.13, and the number per cell between 2.10 and 2.25.  The patient's subsequent history is as detailed below  INTERVAL HISTORY: Margaret [name] returns today for follow-up today of her ER positive breast cancer.  We are seeing her monthly.  She is taking Anastrozole daily.  She is also taking Palbociclib 157m daily.  She has had some left hip pain and then went to physical therapy today.  Concern was mentioned about it being related to the Anastrozole.   REVIEW OF SYSTEMS:  She is fatigued.  She is having difficulty with sleeping.  She goes to sleep at 10-11 and cannot  fall asleep until 4-5 am.  She drinks one cup of coffee per day.  She watches tv late at night, but otherwise doesn't have any issues.  She is requesting a refill on Tramadol for her pain in her left breast, left hip.  She has had the left  hip imaged.    PAST MEDICAL HISTORY: Past Medical History:  Diagnosis Date  . Bilateral breast cancer ([place])   . Breast cancer ([place])   . Breast cancer of upper-outer quadrant of left female breast ([place]) 04/21/2015  . GERD (gastroesophageal reflux disease)   . Hypertension     PAST SURGICAL HISTORY: Past Surgical History:  Procedure Laterality Date  . ABDOMINAL HYSTERECTOMY  1990  . MASTECTOMY MODIFIED RADICAL Left 05/09/2015  . MASTECTOMY MODIFIED RADICAL Left 05/09/2015   Procedure: LEFT MODIFIED RADICAL MASTETCTOMY;  Surgeon: Autumn Messing III, MD;  Location: [facility];  Service: General;  Laterality: Left;  Marland Kitchen MASTECTOMY W/ SENTINEL NODE BIOPSY Right   . MASTECTOMY W/ SENTINEL NODE BIOPSY Right 05/09/2015   Procedure: RIGHT MASTECTOMY WITH RIGHT AXILLARY SENTINEL LYMPH NODE BIOPSY;  Surgeon: Autumn Messing III, MD;  Location: [facility];  Service: General;  Laterality: Right;  . PORTACATH PLACEMENT Right 09/01/2015   Procedure: INSERTION PORT-A-CATH;  Surgeon: Autumn Messing III, MD;  Location: [facility];  Service: General;  Laterality: Right;  . TUBAL LIGATION  1984    FAMILY HISTORY Family History  Problem Relation Age of Onset  . Hypertension Mother   . Diabetes Mother     the patient has little information on her father. Her mother is living, currently age 42. She has one brother, 2 sisters. There is no history of breast or ovarian cancer in the family to her knowledge.   GYNECOLOGIC HISTORY:  No LMP recorded. Patient has had a hysterectomy.  menarche age 89, first live birth age 35. She is GX P4. She underwent a total abdominal hysterectomy with bilateral salpingo-oophorectomy at age 43. She did not take hormone replacement. She never used oral contraceptives.   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 She is single and lives alone  The patient's daughter  Margaret [name]  works as a Programmer, applications, as does her daughter  Margaret [name]. Daughter Margaret [name] works at [employer], as a call. All 3 live in [place]. The patient's son Marc Morgans Boze's lives in [place]. He prepares sets for shows The patient has 4 grandchildren, no great grandchildren. She attends a [employer] in [place].     ADVANCED DIRECTIVES: Not in place. At the initial clinic visit the patient was given the appropriate forms to complete and notarize at her discretion. The patient intends to name her daughter Sampson Si as healthcare power of attorney. She can be reached at [phone].  HEALTH MAINTENANCE: Social History  Substance Use Topics  . Smoking status: Former Smoker    Packs/day: 0.10    Years: 28.00  . Smokeless tobacco: Never Used  . Alcohol use No     Colonoscopy: Never  PAP: Status post hysterectomy  Bone density: Never  Lipid panel:  Allergies  Allergen Reactions  . Latex Itching and Other (See Comments)    burning  . 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 Wheat Bran Hives    Current Outpatient Prescriptions  Medication Sig Dispense Refill  .  ALPRAZolam (XANAX) 1 MG tablet Take 1 tablet (1 mg total) by mouth 2 (two) times daily as needed for anxiety. 60 tablet 1  . anastrozole (ARIMIDEX) 1 MG tablet Take 1 tablet (1 mg total) by mouth daily. 90 tablet 4  . gabapentin (NEURONTIN) 300 MG capsule Take 2 capsules (600 mg total) by mouth 3 (three) times daily. 180 capsule 1  . ibuprofen (ADVIL,MOTRIN) 800 MG tablet TAKE 1 TABLET BY MOUTH EVERY 8 HOURS AS NEEDED 30 tablet 0  . Investigational palbociclib (IBRANCE) 125 MG capsule [facility] AFT-05 P[place]AS Take 1 capsule (125 mg total) by mouth daily. Take with food. Swallow whole. Do not chew. Take on days 1-21. Repeat every 28 days. 63 capsule 0  . losartan-hydrochlorothiazide (HYZAAR) 100-25 MG tablet Take 1 tablet by mouth daily. 90 tablet 3  . pantoprazole  (PROTONIX) 40 MG tablet Take 1 tablet (40 mg total) by mouth daily. 30 tablet 1  . potassium chloride (MICRO-K) 10 MEQ CR capsule Take 1 capsule (10 mEq total) by mouth 2 (two) times daily. 90 capsule 4  . valACYclovir (VALTREX) 1000 MG tablet Take 1 tablet (1,000 mg total) by mouth 2 (two) times daily. 90 tablet 4  . venlafaxine XR (EFFEXOR-XR) 150 MG 24 hr capsule Take 1 capsule (150 mg total) by mouth daily with breakfast. 90 capsule 1   No current facility-administered medications for this visit.       OBJECTIVE:  Vitals:   12/20/16 1500  BP: (!) 160/92  Pulse: 98  Resp: 19  Temp: 98.1 F (36.7 C)     Body mass index is 43.06 kg/m.    ECOG FS:2 - Symptomatic, <50% confined to bed  GENERAL: Patient is a well appearing female in no acute distress HEENT:  Sclerae anicteric.  Oropharynx clear and moist. No ulcerations or evidence of oropharyngeal candidiasis. Neck is supple.  NODES:  No cervical, supraclavicular, or axillary lymphadenopathy palpated.  BREAST EXAM:  Bilateral breasts s/p mastectomy, left breast and axilla are tender to palpation, there are no nodules, masses, skin or nipple changes noted.l LUNGS:  Clear to auscultation bilaterally.  No wheezes or rhonchi. HEART:  Regular rate and rhythm. No murmur appreciated. ABDOMEN:  Soft, nontender.  Positive, normoactive bowel sounds. No organomegaly palpated. MSK:  No focal spinal tenderness to palpation. Full range of motion bilaterally in the upper extremities. EXTREMITIES:  No peripheral edema.   SKIN:  Clear with no obvious rashes or skin changes. No nail dyscrasia. NEURO:  Nonfocal. Well oriented.  Appropriate affect.     LAB RESULTS:  CMP     Component Value Date/Time   NA 142 11/19/2016 1234   K 3.1 (L) 11/19/2016 1234   CL 102 06/29/2015 0923   CO2 32 (H) 11/19/2016 1234   GLUCOSE 141 (H) 11/19/2016 1234   BUN 14.2 11/19/2016 1234   CREATININE 0.9 11/19/2016 1234   CALCIUM 10.1 11/19/2016 1234   PROT 8.2  11/19/2016 1234   ALBUMIN 3.8 11/19/2016 1234   AST 22 11/19/2016 1234   ALT 31 11/19/2016 1234   ALKPHOS 95 11/19/2016 1234   BILITOT 0.31 11/19/2016 1234   GFRNONAA >60 06/29/2015 0923   GFRAA >60 06/29/2015 0923    INo results found for: SPEP, UPEP  Lab Results  Component Value Date   WBC 8.2 12/20/2016   NEUTROABS 5.4 12/20/2016   HGB 14.1 12/20/2016   HCT 41.4 12/20/2016   MCV 99.4 12/20/2016   PLT 214 12/20/2016  Chemistry      Component Value Date/Time   NA 142 11/19/2016 1234   K 3.1 (L) 11/19/2016 1234   CL 102 06/29/2015 0923   CO2 32 (H) 11/19/2016 1234   BUN 14.2 11/19/2016 1234   CREATININE 0.9 11/19/2016 1234   GLU 157 (H) 08/17/2016 0818      Component Value Date/Time   CALCIUM 10.1 11/19/2016 1234   ALKPHOS 95 11/19/2016 1234   AST 22 11/19/2016 1234   ALT 31 11/19/2016 1234   BILITOT 0.31 11/19/2016 1234       No results found for: LABCA2  No components found for: LABCA125  No results for input(s): INR in the last 168 hours.  Urinalysis    Component Value Date/Time   COLORURINE YELLOW 06/29/2015 1030   APPEARANCEUR CLOUDY (A) 06/29/2015 1030   LABSPEC 1.019 06/29/2015 1030   PHURINE 7.0 06/29/2015 1030   GLUCOSEU NEGATIVE 06/29/2015 1030   HGBUR TRACE (A) 06/29/2015 1030   BILIRUBINUR NEGATIVE 06/29/2015 1030   KETONESUR NEGATIVE 06/29/2015 1030   PROTEINUR NEGATIVE 06/29/2015 1030   NITRITE NEGATIVE 06/29/2015 1030   LEUKOCYTESUR MODERATE (A) 06/29/2015 1030    STUDIES: Her left hip fields in October was unremarkable  ASSESSMENT: 56 y.o. [place] woman with synchronous breast cancers, as follows  (a) status post right breast upper outer quadrant biopsy 04/19/2015 for a clinical pT1c N0, stage IA invasive ductal carcinoma, grade 1, estrogen receptor positive, progesterone receptor negative, with an MIB-1 of 5%, and no HER-2 amplification  (b) status post left breast upper outer quadrant biopsy 2 and left axillary lymph node  biopsy 04/19/2015 for a clinical T3 N1, stage IIIA invasive ductal carcinoma, grade 1 or 2, estrogen receptor and progesterone receptor positive, HER-2 negative, with an MIB-1 between 5 and 10%  (1) status post bilateral mastectomies 05/09/2015, showing:   (a) on the right side, a pT2 pN0, stage IIA invasive ductal carcinoma, with negative margins and repeat HER-2 again negative    (b) on the left, a pT3 pN2, stage IIIA invasive ductal carcinoma, grade 2, with negative margins, and repeat HER-2 again negative  (2) Mammaprint from the left-sided tumor returned "luminal type, low risk", predicting a small benefit from chemotherapy with the important caveat that N2 disease was not included in the MINDACT study--patient opted for chemotherapy  (3) Oncotype from the right-sided tumor showed a score of 12, predicting an 8% risk of recurrence outside the breast within the next 10 years, if the patient's only systemic therapy was tamoxifen for 5 years. It also predicted no significant benefit from chemotherapy  (4) postmastectomy radiation to the left chest wall completed 08/15/2015  (5) adjuvant chemotherapy consisting of cyclophosphamide and docetaxel x4 completed on 11/21/15.  (5) anastrozole started 02/08/2016  (6) left upper extremity lymphedema  (7) signed for B-WELL study 02/08/2016  (8) participating in [place] trial as of August 2017  (a) randomized to palbociclib, starting October 2017  PLAN: Nikisha's WBC is normal today.  She will continue Anastrozole and Palbociclib.  She and I reviewed her pain.  We discussed the possibility of an Anastrozole vacation.  She will however, continue with physical therapy for her vaginal dryness and hip issues.  She will think about the possibility of a vacation and discuss with Dr. Jana Hakim at her next appt.  The longest time she can be off of her AI is 4 weeks.  I did refill her Tramadol #60.  This was last filed in January, 2018.  I also sent  her Effexor  to [facility].  Additionally, for her sleep pattern disturbance she will try Trazodone.  She is due to follow up in one month.  She will contact us for any questions or concerns whatsoever.    At the end of the appointment we discovered that her potassium was 2.9.  She is taking 4mq per day and is prescribed 248m per day.  She will take 4017mpo today, and 102m70mo tomorrow and then resume 20me61mr day.  She verbalized understanding of these instructions.  She will return in one week for a lab check so we can make sure her potassium is improving.    A total of (30) minutes of face-to-face time was spent with this patient with greater than 50% of that time in counseling and care-coordination.    LindsScot Dock  12/20/2016 3:23 PM

## 2016-12-20 NOTE — Patient Instructions (Signed)
Moisturizers . They are used in the vagina to hydrate the mucous membrane that make up the vaginal canal. . Designed to keep a more normal acid balance (ph) . Once placed in the vagina, it will last between two to three days.  . Use 2-3 times per week at bedtime and last longer than 60 min. . Ingredients to avoid is glycerin and fragrance, can increase chance of infection . Should not be used just before sex due to causing irritation . Most are gels administered either in a tampon-shaped applicator or as a vaginal suppository. They are non-hormonal.   Types of Moisturizers . Replens- drug store . Samul Dada- drug store . Vitamin E vaginal suppositories- Whole foods . Moist Again . Coconut oil- can break down condoms . [employer] . [place] . V-magic - cream external for the vulva area . Yes moisturizer- amazon  Things to avoid in the vaginal area . Do not use things to irritate the vulvar area . No lotions just specialized creams for the vulva area- Neogyn, V-magic, [place] . No soaps; can use Aveeno or Calendula cleanser if needed. Must be gentle . No deodorants . No douches . Good to sleep without underwear to let the vaginal area to air out . No scrubbing: spread the lips to let warm water rinse over labias and pat dry

## 2016-12-21 NOTE — Therapy (Addendum)
[facility] [address] [place], [place], [address] Phone: [phone]   Fax:  [phone]  Physical Therapy Evaluation  Patient Details  Name: BRIGITTE SODERBERG MRN: [id number] Date of Birth: 04-28-1961 Referring Provider: Chauncey Cruel, MD  Encounter Date: 12/20/2016      PT End of Session - 12/20/16 1132    Visit Number 1   Date for PT Re-Evaluation 04/11/17   PT Start Time 1115   PT Stop Time 1150   PT Time Calculation (min) 35 min   Activity Tolerance Patient tolerated treatment well   Behavior During Therapy [facility] for tasks assessed/performed  became tearful when talking about      Past Medical History:  Diagnosis Date  . Bilateral breast cancer ([place])   . Breast cancer ([place])   . Breast cancer of upper-outer quadrant of left female breast ([place]) 04/21/2015  . GERD (gastroesophageal reflux disease)   . Hypertension     Past Surgical History:  Procedure Laterality Date  . ABDOMINAL HYSTERECTOMY  1990  . MASTECTOMY MODIFIED RADICAL Left 05/09/2015  . MASTECTOMY MODIFIED RADICAL Left 05/09/2015   Procedure: LEFT MODIFIED RADICAL MASTETCTOMY;  Surgeon: Autumn Messing III, MD;  Location: [facility];  Service: General;  Laterality: Left;  Marland Kitchen MASTECTOMY W/ SENTINEL NODE BIOPSY Right   . MASTECTOMY W/ SENTINEL NODE BIOPSY Right 05/09/2015   Procedure: RIGHT MASTECTOMY WITH RIGHT AXILLARY SENTINEL LYMPH NODE BIOPSY;  Surgeon: Autumn Messing III, MD;  Location: [facility];  Service: General;  Laterality: Right;  . PORTACATH PLACEMENT Right 09/01/2015   Procedure: INSERTION PORT-A-CATH;  Surgeon: Autumn Messing III, MD;  Location: [facility];  Service: General;  Laterality: Right;  . TUBAL LIGATION  1984    There were no vitals filed for this visit.       Subjective Assessment - 12/20/16 1119    Subjective I have vaginal dryness and my hip has been hurting.  My job let me go because I was needing to stand a lot working  for [employer].  Had the hip pain since after chemo. I have to use a cart in the grocery store.    patient arrived 15 minutes late   Pertinent History bilateral mastectomy in October 2016 9 nodes removed from left side and 5 from the right, hysterectomy, tubaligation   Limitations Walking   How long can you walk comfortably? 10-15 on a good day   Patient Stated Goals strength, no pain, and go back to work   Currently in Pain? Yes   Pain Score 10-Worst pain ever  10 means I need to sit down or lay down   Pain Location Hip   Pain Orientation Left;Lateral   Pain Descriptors / Indicators Cramping   Pain Type Chronic pain   Pain Radiating Towards low abdomen   Pain Onset More than a month ago   Pain Frequency Constant   Aggravating Factors  going up and down stairs, lying on left side, getting up after lying down hip gives way   Pain Relieving Factors ibuprofen, lying down   Effect of Pain on Daily Activities working   Multiple Pain Sites No            [facility] PT Assessment - 12/21/16 0001      Assessment   Medical Diagnosis C50.211,Z17.0 (ICD-10-CM) - Malignant neoplasm of upper-inner quadrant of right breast in female, estrogen receptor positive [facility])   Referring Provider Magrinat, Virgie Dad, MD   Prior  Therapy No     Precautions   Precautions None     Restrictions   Weight Bearing Restrictions No     Balance Screen   Has the patient fallen in the past 6 months No     [place] residence   Living Arrangements Alone     Prior Function   Level of Independence Independent   Vocation Unemployed     Cognition   Overall Cognitive Status Within Functional Limits for tasks assessed     Observation/Other Assessments   Focus on Therapeutic Outcomes (FOTO)  96% limited     Posture/Postural Control   Posture/Postural Control Postural limitations   Postural Limitations Rounded Shoulders;Forward head  appears to have anterior pelvic tilt but excess  adipose    Posture Comments Pt with abdominal obesity and wears "body shaper "garments      PROM   Left Hip External Rotation  --  limited and painful   Left Hip Internal Rotation  --  limited and painful   Left Hip ABduction --  limited and painful   Left Hip ADduction --  limited and painful   Lumbar Flexion 50% limited   Lumbar Extension 50% limited                 Pelvic Floor Special Questions - 12/21/16 0001    Prior Pelvic/Prostate Exam Yes   Are you Pregnant or attempting pregnancy? No   Prior Pregnancies Yes   Number of Pregnancies 4   Number of Vaginal Deliveries 4   Currently Sexually Active No   Urinary Leakage Yes   Activities that cause leaking Coughing;Sneezing   External Perineal Exam deferred   Exam Type Deferred                  PT Education - 12/20/16 1549    Education provided Yes   Education Details moisturizers and given [place]   Person(s) Educated Patient   Methods Explanation;Handout   Comprehension Verbalized understanding          PT Short Term Goals - 12/21/16 1751      PT SHORT TERM GOAL #1   Title independent with initial HEP   Time 4   Period Weeks   Status New     PT SHORT TERM GOAL #2   Title MMT 4/5 bilateral hip flexion, abduction, and knee extension for improved functional movements   Time 4   Period Weeks   Status New     PT SHORT TERM GOAL #3   Title 25% less pain on average day due to increased ability to relax pelvic floor and using moisturizers for tissue health   Time 4   Period Weeks   Status New           PT Long Term Goals - 12/21/16 1757      PT LONG TERM GOAL #1   Title FOTO reduced to 80% or less limited   Time 16   Period Weeks   Status New     PT LONG TERM GOAL #2   Title 5 times sit to stand < or = to 15 seconds for improved functional activities   Time 16   Period Weeks   Status New     PT LONG TERM GOAL #3   Title able to ascend and descend stairs with 50%  less pain in order to tolerate getting in and out of her house   Time 16  Period Weeks   Status New     PT LONG TERM GOAL #4   Title able to walk 30 minutes per day in order to get on an exercise program for weight management   Time 16   Period Weeks   Status New     PT LONG TERM GOAL #5   Title reports 50% less pain and able to manage pain with self care techniques and HEP   Time 16   Period Weeks   Status New              Plan - 12/21/16 1709    Clinical Impression Statement Patient is estrogen receptor positive and has recently been experiencing complications such as vaginal dryness.  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  Pt has reduced A/PROM with pain in left hip all directions and lumbar AROM limited by 50%with flexion and extension.  Patient has tenderness of left glutes, TFL, IT band, and adductors.  She demonstrats 3+/5 to 4/5 weakness with MMT of left LE.  Patient deferred any exam of pelvic floor today but was given information on moisturizers due to comprimised tissues as side effect of estrogen receptor positive.  Patient is moderate complexity due to comorbidities including history of cancer with estrogen positive, obesity, abdominal surgeries.  She has noticed worsening condition and needs examination of bilateral LE, pelvis, abdomen. Patient will benefit from skilled PT for management of symptoms and to address these impairments to she can return to maximum function.   Rehab Potential Good   Clinical Impairments Affecting Rehab Potential history of cancer with estrogen positive, obesity, abdominal surgeries   PT Frequency Other (comment)  2x/ week, reducing to 1x/month   PT Duration Other (comment)  16 weeks with taper   PT Treatment/Interventions ADLs/Self Care Home Management;Biofeedback;Cryotherapy;Electrical Stimulation;Iontophoresis '4mg'$ /ml  Dexamethasone;Moist Heat;Ultrasound;Gait training;Functional mobility training;Therapeutic activities;Therapeutic exercise;Patient/family education;Neuromuscular re-education;Balance training;Manual techniques;Passive range of motion;Taping;Dry needling   PT Next Visit Plan manual, abdominal massage, pelvic floor stretches   Consulted and Agree with Plan of Care Patient      Patient will benefit from skilled therapeutic intervention in order to improve the following deficits and impairments:  Decreased coordination, Decreased range of motion, Decreased strength, Difficulty walking, Pain, Increased muscle spasms, Postural dysfunction, Obesity  Visit Diagnosis: Muscle weakness (generalized)  Pain in left hip  Other lack of coordination     Problem List Patient Active Problem List   Diagnosis Date Noted  . Adjustment disorder with mixed anxiety and depressed mood 07/17/2016  . Morbid obesity ([place]) 11/28/2015  . Chest wall pain 11/28/2015  . Malignant neoplasm of upper-inner quadrant of right breast in female, estrogen receptor positive ([place]) 04/27/2015  . Hypertension, essential 04/27/2015  . Malignant neoplasm of upper-outer quadrant of left breast in female, estrogen receptor positive ([place]) 04/21/2015    Zannie Cove, PT 12/21/2016, 6:12 PM  [facility] [address], [place], [place], [address] Phone: [phone]   Fax:  [phone]  Name: MILDRETH REEK MRN: [id number] Date of Birth: Dec 31, 1960 PHYSICAL THERAPY DISCHARGE SUMMARY  Visits from Start of Care: 1  Current functional level related to goals / functional outcomes: See above, one visit only   Remaining deficits: See above    Education / Equipment: HEP  Plan: Patient agrees to discharge.  Patient goals were not met. Patient is being  discharged due to not returning since the last visit.  ?????         Google, PT 03/12/17 11:21  AM

## 2016-12-25 ENCOUNTER — Other Ambulatory Visit: Payer: Self-pay | Admitting: Oncology

## 2016-12-25 MED FILL — VENLAFAXINE HCL ER 150 MG C: 150 | 90 days supply | Qty: 90 | Fill #0

## 2016-12-26 ENCOUNTER — Telehealth: Payer: Self-pay | Admitting: *Deleted

## 2016-12-26 MED FILL — IBUPROFEN 800 MG TABLET: 800 | 10 days supply | Qty: 30 | Fill #0

## 2016-12-26 NOTE — Telephone Encounter (Signed)
Called patient to remind her to bring her empty pill bottle for Cycle #8 and both palbocicilib and anti-hormone diaries. She thought she brought that in last week--reminded her that she brought in bottle for Cycle #9 (still missing #8). She will look for the items and bring them in tomorrow. Requested she also try one more time to locate the anti-hormone diary for cycle #5. Mauri Reading Michaelle Copas, RN, BSN Clinical Research Nurse 12/26/2016 @ 3:30pm

## 2016-12-27 ENCOUNTER — Other Ambulatory Visit (HOSPITAL_BASED_OUTPATIENT_CLINIC_OR_DEPARTMENT_OTHER): Payer: Self-pay

## 2016-12-27 ENCOUNTER — Encounter: Payer: Self-pay | Admitting: *Deleted

## 2016-12-27 DIAGNOSIS — C50211 Malignant neoplasm of upper-inner quadrant of right female breast: Secondary | ICD-10-CM

## 2016-12-27 DIAGNOSIS — Z170 Estrogen receptor positive status [ER+]: Principal | ICD-10-CM

## 2016-12-27 DIAGNOSIS — C50412 Malignant neoplasm of upper-outer quadrant of left female breast: Secondary | ICD-10-CM

## 2016-12-27 DIAGNOSIS — E876 Hypokalemia: Secondary | ICD-10-CM

## 2016-12-27 DIAGNOSIS — C50411 Malignant neoplasm of upper-outer quadrant of right female breast: Secondary | ICD-10-CM

## 2016-12-27 LAB — CBC WITH DIFFERENTIAL/PLATELET
BASO%: 2.3 % — ABNORMAL HIGH (ref 0.0–2.0)
Basophils Absolute: 0.1 10*3/uL (ref 0.0–0.1)
EOS%: 2.8 % (ref 0.0–7.0)
Eosinophils Absolute: 0.1 10*3/uL (ref 0.0–0.5)
HCT: 37.9 % (ref 34.8–46.6)
HGB: 12.8 g/dL (ref 11.6–15.9)
LYMPH%: 34.3 % (ref 14.0–49.7)
MCH: 34 pg (ref 25.1–34.0)
MCHC: 33.7 g/dL (ref 31.5–36.0)
MCV: 100.8 fL (ref 79.5–101.0)
MONO#: 0.3 10*3/uL (ref 0.1–0.9)
MONO%: 5.6 % (ref 0.0–14.0)
NEUT#: 2.7 10*3/uL (ref 1.5–6.5)
NEUT%: 55 % (ref 38.4–76.8)
Platelets: 249 10*3/uL (ref 145–400)
RBC: 3.76 10*6/uL (ref 3.70–5.45)
RDW: 15.1 % — ABNORMAL HIGH (ref 11.2–14.5)
WBC: 4.9 10*3/uL (ref 3.9–10.3)
lymph#: 1.7 10*3/uL (ref 0.9–3.3)

## 2016-12-27 LAB — BASIC METABOLIC PANEL
Anion Gap: 8 mEq/L (ref 3–11)
BUN: 15.8 mg/dL (ref 7.0–26.0)
CO2: 26 mEq/L (ref 22–29)
Calcium: 9.6 mg/dL (ref 8.4–10.4)
Chloride: 103 mEq/L (ref 98–109)
Creatinine: 1 mg/dL (ref 0.6–1.1)
EGFR: 72 mL/min/{1.73_m2} — ABNORMAL LOW (ref >90)
Glucose: 268 mg/dl — ABNORMAL HIGH (ref 70–140)
Potassium: 3.6 mEq/L (ref 3.5–5.1)
Sodium: 137 mEq/L (ref 136–145)
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
## 2017-01-10 ENCOUNTER — Other Ambulatory Visit: Payer: Self-pay | Admitting: Oncology

## 2017-01-10 DIAGNOSIS — Z170 Estrogen receptor positive status [ER+]: Principal | ICD-10-CM

## 2017-01-10 DIAGNOSIS — C50412 Malignant neoplasm of upper-outer quadrant of left female breast: Secondary | ICD-10-CM

## 2017-01-10 MED FILL — GABAPENTIN 300 MG CAPSULE: 300 | 30 days supply | Qty: 180 | Fill #1

## 2017-01-10 MED FILL — LOSARTAN-HCTZ 100-25 MG TAB: 100-25 | 90 days supply | Qty: 90 | Fill #1

## 2017-01-10 MED FILL — valACYclovir HCL 1 GM TABS: 1 | 45 days supply | Qty: 90 | Fill #1

## 2017-01-11 MED FILL — IBUPROFEN 800 MG TAB: 800 | 10 days supply | Qty: 30 | Fill #0

## 2017-01-11 MED FILL — PANTOPRAZOLE SOD DR 40 MG T: 40 | 30 days supply | Qty: 30 | Fill #0

## 2017-01-14 ENCOUNTER — Encounter: Payer: Self-pay | Admitting: *Deleted

## 2017-01-14 ENCOUNTER — Ambulatory Visit (HOSPITAL_BASED_OUTPATIENT_CLINIC_OR_DEPARTMENT_OTHER): Payer: Self-pay | Admitting: Oncology

## 2017-01-14 ENCOUNTER — Other Ambulatory Visit (HOSPITAL_BASED_OUTPATIENT_CLINIC_OR_DEPARTMENT_OTHER): Payer: Self-pay

## 2017-01-14 VITALS — BP 181/110 | HR 90 | Temp 98.2°F | Resp 18 | Ht 69.0 in | Wt 293.6 lb

## 2017-01-14 DIAGNOSIS — C50412 Malignant neoplasm of upper-outer quadrant of left female breast: Secondary | ICD-10-CM

## 2017-01-14 DIAGNOSIS — C50211 Malignant neoplasm of upper-inner quadrant of right female breast: Secondary | ICD-10-CM

## 2017-01-14 DIAGNOSIS — C50411 Malignant neoplasm of upper-outer quadrant of right female breast: Secondary | ICD-10-CM

## 2017-01-14 DIAGNOSIS — Z170 Estrogen receptor positive status [ER+]: Secondary | ICD-10-CM

## 2017-01-14 DIAGNOSIS — C50812 Malignant neoplasm of overlapping sites of left female breast: Secondary | ICD-10-CM

## 2017-01-14 DIAGNOSIS — G629 Polyneuropathy, unspecified: Secondary | ICD-10-CM

## 2017-01-14 DIAGNOSIS — C50011 Malignant neoplasm of nipple and areola, right female breast: Secondary | ICD-10-CM

## 2017-01-14 DIAGNOSIS — Z79811 Long term (current) use of aromatase inhibitors: Secondary | ICD-10-CM

## 2017-01-14 DIAGNOSIS — C50012 Malignant neoplasm of nipple and areola, left female breast: Secondary | ICD-10-CM

## 2017-01-14 LAB — CBC WITH DIFFERENTIAL/PLATELET
BASO%: 2.1 % — ABNORMAL HIGH (ref 0.0–2.0)
Basophils Absolute: 0.1 10*3/uL (ref 0.0–0.1)
EOS%: 1.1 % (ref 0.0–7.0)
Eosinophils Absolute: 0.1 10*3/uL (ref 0.0–0.5)
HCT: 42.7 % (ref 34.8–46.6)
HGB: 14.3 g/dL (ref 11.6–15.9)
LYMPH%: 28 % (ref 14.0–49.7)
MCH: 34.6 pg — ABNORMAL HIGH (ref 25.1–34.0)
MCHC: 33.5 g/dL (ref 31.5–36.0)
MCV: 103.2 fL — ABNORMAL HIGH (ref 79.5–101.0)
MONO#: 0.8 10*3/uL (ref 0.1–0.9)
MONO%: 12 % (ref 0.0–14.0)
NEUT#: 3.7 10*3/uL (ref 1.5–6.5)
NEUT%: 56.8 % (ref 38.4–76.8)
Platelets: 199 10*3/uL (ref 145–400)
RBC: 4.13 10*6/uL (ref 3.70–5.45)
RDW: 16 % — ABNORMAL HIGH (ref 11.2–14.5)
WBC: 6.5 10*3/uL (ref 3.9–10.3)
lymph#: 1.8 10*3/uL (ref 0.9–3.3)

## 2017-01-14 LAB — COMPREHENSIVE METABOLIC PANEL
ALT: 31 U/L (ref 0–55)
AST: 29 U/L (ref 5–34)
Albumin: 3.8 g/dL (ref 3.5–5.0)
Alkaline Phosphatase: 95 U/L (ref 40–150)
Anion Gap: 10 mEq/L (ref 3–11)
BUN: 16.8 mg/dL (ref 7.0–26.0)
CO2: 30 mEq/L — ABNORMAL HIGH (ref 22–29)
Calcium: 10.2 mg/dL (ref 8.4–10.4)
Chloride: 103 mEq/L (ref 98–109)
Creatinine: 1 mg/dL (ref 0.6–1.1)
EGFR: 77 mL/min/{1.73_m2} — ABNORMAL LOW (ref >90)
Glucose: 194 mg/dl — ABNORMAL HIGH (ref 70–140)
Potassium: 3.5 mEq/L (ref 3.5–5.1)
Sodium: 142 mEq/L (ref 136–145)
Total Bilirubin: 0.25 mg/dL (ref 0.20–1.20)
Total Protein: 8.2 g/dL (ref 6.4–8.3)

## 2017-01-14 MED ORDER — VALACYCLOVIR HCL 1 G PO TABS: 1000.0000 mg | ORAL_TABLET | Freq: Two times a day (BID) | ORAL | 4 refills | Status: DC

## 2017-01-14 MED ORDER — LOSARTAN POTASSIUM-HCTZ 100-25 MG PO TABS: 1.0000 | ORAL_TABLET | Freq: Every day | ORAL | 3 refills | Status: DC

## 2017-01-14 MED ORDER — ANASTROZOLE 1 MG PO TABS: 1.0000 mg | ORAL_TABLET | Freq: Every day | ORAL | 4 refills | Status: DC

## 2017-01-14 MED ORDER — GABAPENTIN 300 MG PO CAPS: 600.0000 mg | ORAL_CAPSULE | Freq: Three times a day (TID) | ORAL | 1 refills | Status: DC

## 2017-01-14 MED ORDER — TRAMADOL HCL 50 MG PO TABS: 50.0000 mg | ORAL_TABLET | Freq: Three times a day (TID) | ORAL | 0 refills | Status: DC | PRN

## 2017-01-14 MED ORDER — IBUPROFEN 800 MG PO TABS: 800.0000 mg | ORAL_TABLET | Freq: Three times a day (TID) | ORAL | 0 refills | Status: DC | PRN

## 2017-01-14 MED ORDER — VALACYCLOVIR HCL 1 G PO TABS
1000.0000 mg | ORAL_TABLET | Freq: Two times a day (BID) | ORAL | 4 refills | Status: DC
Start: 2017-01-14 — End: 2017-03-28

## 2017-01-14 MED FILL — traMADol HCL 50 MG TABS: 50 | 20 days supply | Qty: 60 | Fill #0

## 2017-01-14 MED FILL — ANASTROZOLE 1 MG TABLET: 1 | 90 days supply | Qty: 90 | Fill #0

## 2017-01-14 NOTE — Progress Notes (Signed)
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 She has been receiving medical care through the emergency room and was seen in March 2015 following a head injury, then in August 2015 for lancing of an abscess. In December 2015 she noticed that her left breast looked a little bit smaller than her right. She brought this to the attention of friends and family over the next several months but the general feeling was that most people are not perfectly symmetrical. By the summer of this year she noticed that her nipple on the left was "going in". More recently she started developing pain in the left breast. She was evaluated for this in the emergency room 04/09/2015 at which time a left breast exam showed a deformed left breast with a large hard mass encompassing most of the breast, with nipple retraction. There was no nipple discharge or bleeding and no palpable adenopathy.  The patient was referred to [facility] and on 04/13/2015 she underwent bilateral diagnostic mammography with tomosynthesis as well as bilateral breast ultrasound. The breast  density was category B. In the right breast at the 11:00 position there was a 1.3 cm irregular mass which by ultrasonography measured 1.3 cm.--In the left breast there was a 4 cm irregular mass at the 2:00 position associated with nipple retraction. There was a second, 1.7 cm area of architectural distortion at the 9:00 position. Both were palpable. By ultrasonography, the 4 cm irregular mass was noted, with a second mass measuring 2.5 cm by ultrasonography. Both axillae were benign.  On 04/19/2015 the patient underwent right breast upper outer quadrant biopsy, showing ([id number]) and invasive ductal carcinoma, grade 1, estrogen receptor 90% positive, progesterone receptor negative, with an MIB-1 of 5%, and no HER-2 amplification, the signals ratio being 1.26 and the number per cell 2.20.  On the same day, she underwent biopsy of the 2 left breast masses in question as well as a suspicious left axillary lymph node. All 3 biopsies showed invasive ductal carcinoma, grade 2, estrogen receptor 80-100% positive, progesterone receptor 80-100% positive, with the MIB-1 between 5 and 10%, and no HER-2 amplification, the signals ratio being between 1.05 and 1.13, and the number per cell between 2.10 and 2.25.  The patient's subsequent history is as detailed below  INTERVAL HISTORY: Margaret [name] returns today for follow-up of her estrogen receptor positive breast cancer accompanied by one of her granddaughters. Tuesdays taking anastrozole with good tolerance.  Hot flashes and vaginal dryness are not a major issue. She never developed the arthralgias or myalgias that many patients can experience on this medication. She obtains it at a good price.  She is also on palbociclib as part of the PALLAS trial. She does not have significant problems  with fatigue or nausea associated with this medication. There have been no intercurrent fevers.   REVIEW OF SYSTEMS: Margaret [name] ran out of Valtrex. She still has a little bit of a sore  mouth. Overall though she says she is doing "good".. She does complain of pain in her hips which is not a new problem and is not related I believe to you the anastrozole or the palbociclib. A detailed review of systems today was otherwise stable.  PAST MEDICAL HISTORY: Past Medical History:  Diagnosis Date  . Bilateral breast cancer ([place])   . Breast cancer ([place])   . Breast cancer of upper-outer quadrant of left female breast ([place]) 04/21/2015  . GERD (gastroesophageal reflux disease)   . Hypertension     PAST SURGICAL HISTORY: Past Surgical History:  Procedure Laterality Date  . ABDOMINAL HYSTERECTOMY  1990  . MASTECTOMY MODIFIED RADICAL Left 05/09/2015  . MASTECTOMY MODIFIED RADICAL Left 05/09/2015   Procedure: LEFT MODIFIED RADICAL MASTETCTOMY;  Surgeon: Autumn Messing III, MD;  Location: [facility];  Service: General;  Laterality: Left;  Marland Kitchen MASTECTOMY W/ SENTINEL NODE BIOPSY Right   . MASTECTOMY W/ SENTINEL NODE BIOPSY Right 05/09/2015   Procedure: RIGHT MASTECTOMY WITH RIGHT AXILLARY SENTINEL LYMPH NODE BIOPSY;  Surgeon: Autumn Messing III, MD;  Location: [facility];  Service: General;  Laterality: Right;  . PORTACATH PLACEMENT Right 09/01/2015   Procedure: INSERTION PORT-A-CATH;  Surgeon: Autumn Messing III, MD;  Location: [facility];  Service: General;  Laterality: Right;  . TUBAL LIGATION  1984    FAMILY HISTORY Family History  Problem Relation Age of Onset  . Hypertension Mother   . Diabetes Mother     the patient has little information on her father. Her mother is living, currently age 76. She has one brother, 2 sisters. There is no history of breast or ovarian cancer in the family to her knowledge.   GYNECOLOGIC HISTORY:  No LMP recorded. Patient has had a hysterectomy.  menarche age 65, first live birth age 62. She is GX P4. She underwent a total abdominal hysterectomy with bilateral salpingo-oophorectomy at age 34. She did not take hormone replacement. She never used oral  contraceptives.   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 She is single and lives alone  The patient's daughter  Margaret [name] works as a Programmer, applications, as does her daughter  Margaret [name]. Daughter [name] works at [employer], as a call. All 3 live in [place]. The patient's son Marc Morgans Geier's lives in [place]. He prepares sets for shows The patient has 4 grandchildren, no great grandchildren. She attends a [employer] in [place].     ADVANCED DIRECTIVES: Not in place. At the initial clinic visit the patient was given the appropriate forms to complete and notarize at her discretion. The patient intends to name her daughter Sampson Si as healthcare power of attorney. She can be reached at [phone].  HEALTH MAINTENANCE: Social History  Substance Use Topics  . Smoking status: Former Smoker    Packs/day: 0.10    Years: 28.00  . Smokeless tobacco: Never Used  . Alcohol use No     Colonoscopy: Never  PAP: Status post hysterectomy  Bone density: Never  Lipid panel:  Allergies  Allergen Reactions  . Latex Itching and Other (See Comments)    burning  . 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 Wheat Bran Hives    Current Outpatient Prescriptions  Medication Sig Dispense Refill  . ALPRAZolam (XANAX) 1 MG tablet Take 1 tablet (1 mg total) by mouth 2 (two) times daily as needed for anxiety. 60 tablet 1  . anastrozole (ARIMIDEX) 1 MG tablet Take 1 tablet (1 mg total) by mouth daily. 90 tablet 4  . gabapentin (NEURONTIN) 300 MG capsule Take 2 capsules (600 mg total) by mouth 3 (three) times daily. 180 capsule 1  . ibuprofen (ADVIL,MOTRIN) 800 MG tablet TAKE 1 TABLET BY MOUTH EVERY 8 HOURS AS NEEDED 30 tablet 0  . Investigational palbociclib (IBRANCE) 125 MG capsule [facility] AFT-05 PALLAS Take 1 capsule  (125 mg total) by mouth daily. Take with food. Swallow whole. Do not chew. Take on days 1-21. Repeat every 28 days. 63 capsule 0  . losartan-hydrochlorothiazide (HYZAAR) 100-25 MG tablet Take 1 tablet by mouth daily. 90 tablet 3  . pantoprazole (PROTONIX) 40 MG tablet TAKE 1 TABLET BY MOUTH ONCE DAILY 30 tablet 1  . potassium chloride (MICRO-K) 10 MEQ CR capsule Take 1 capsule (10 mEq total) by mouth 2 (two) times daily. 90 capsule 4  . traMADol (ULTRAM) 50 MG tablet Take 1 tablet (50 mg total) by mouth every 8 (eight) hours as needed. 60 tablet 0  . traZODone (DESYREL) 50 MG tablet Take 1 tablet (50 mg total) by mouth at bedtime. May increase to 100mg  after a few days if needed 60 tablet 0  . valACYclovir (VALTREX) 1000 MG tablet Take 1 tablet (1,000 mg total) by mouth 2 (two) times daily. 90 tablet 4  . venlafaxine XR (EFFEXOR-XR) 150 MG 24 hr capsule Take 1 capsule (150 mg total) by mouth daily with breakfast. 90 capsule 1   No current facility-administered medications for this visit.       OBJECTIVE: Middle-aged African-American woman In no acute distress  Vitals:   01/14/17 1207  BP: (!) 181/110  Pulse: 90  Resp: 18  Temp: 98.2 F (36.8 C)     Body mass index is 43.36 kg/m.    ECOG FS:1 - Symptomatic but completely ambulatory   Sclerae unicteric, EOMs intact Oropharynx clear and moist No cervical or supraclavicular adenopathy Lungs no rales or rhonchi Heart regular rate and rhythm Abd soft, nontender, positive bowel sounds MSK no focal spinal tenderness, no upper extremity lymphedema Neuro: nonfocal, well oriented, appropriate affect Breasts: Deferred  LAB RESULTS:  CMP     Component Value Date/Time   NA 142 01/14/2017 1117   K 3.5 01/14/2017 1117   CL 102 06/29/2015 0923   CO2 30 (H) 01/14/2017 1117   GLUCOSE 194 (H) 01/14/2017 1117   BUN 16.8 01/14/2017 1117   CREATININE 1.0 01/14/2017 1117   CALCIUM 10.2 01/14/2017 1117   PROT 8.2 01/14/2017 1117   ALBUMIN 3.8  01/14/2017 1117   AST 29 01/14/2017 1117   ALT 31 01/14/2017 1117   ALKPHOS 95 01/14/2017 1117   BILITOT 0.25 01/14/2017 1117   GFRNONAA >60 06/29/2015 0923   GFRAA >60 06/29/2015 0923    INo results found for: SPEP, UPEP  Lab Results  Component Value Date   WBC 6.5 01/14/2017   NEUTROABS 3.7 01/14/2017   HGB 14.3 01/14/2017   HCT 42.7 01/14/2017   MCV 103.2 (H) 01/14/2017   PLT 199 01/14/2017      Chemistry      Component Value Date/Time   NA 142 01/14/2017 1117  K 3.5 01/14/2017 1117   CL 102 06/29/2015 0923   CO2 30 (H) 01/14/2017 1117   BUN 16.8 01/14/2017 1117   CREATININE 1.0 01/14/2017 1117   GLU 157 (H) 08/17/2016 0818      Component Value Date/Time   CALCIUM 10.2 01/14/2017 1117   ALKPHOS 95 01/14/2017 1117   AST 29 01/14/2017 1117   ALT 31 01/14/2017 1117   BILITOT 0.25 01/14/2017 1117       No results found for: LABCA2  No components found for: LABCA125  No results for input(s): INR in the last 168 hours.  Urinalysis    Component Value Date/Time   COLORURINE YELLOW 06/29/2015 1030   APPEARANCEUR CLOUDY (A) 06/29/2015 1030   LABSPEC 1.019 06/29/2015 1030   PHURINE 7.0 06/29/2015 1030   GLUCOSEU NEGATIVE 06/29/2015 1030   HGBUR TRACE (A) 06/29/2015 1030   BILIRUBINUR NEGATIVE 06/29/2015 1030   KETONESUR NEGATIVE 06/29/2015 1030   PROTEINUR NEGATIVE 06/29/2015 1030   NITRITE NEGATIVE 06/29/2015 1030   LEUKOCYTESUR MODERATE (A) 06/29/2015 1030    STUDIES: No results found.   ASSESSMENT: 56 y.o. [place] woman with synchronous breast cancers, as follows  (a) status post right breast upper outer quadrant biopsy 04/19/2015 for a clinical pT1c N0, stage IA invasive ductal carcinoma, grade 1, estrogen receptor positive, progesterone receptor negative, with an MIB-1 of 5%, and no HER-2 amplification  (b) status post left breast upper outer quadrant biopsy 2 and left axillary lymph node biopsy 04/19/2015 for a clinical T3 N1, stage IIIA  invasive ductal carcinoma, grade 1 or 2, estrogen receptor and progesterone receptor positive, HER-2 negative, with an MIB-1 between 5 and 10%  (1) status post bilateral mastectomies 05/09/2015, showing:   (a) on the right side, a pT2 pN0, stage IIA invasive ductal carcinoma, with negative margins and repeat HER-2 again negative    (b) on the left, a pT3 pN2, stage IIIA invasive ductal carcinoma, grade 2, with negative margins, and repeat HER-2 again negative  (2) Mammaprint from the left-sided tumor returned "luminal type, low risk", predicting a small benefit from chemotherapy with the important caveat that N2 disease was not included in the MINDACT study--patient opted for chemotherapy  (3) Oncotype from the right-sided tumor showed a score of 12, predicting an 8% risk of recurrence outside the breast within the next 10 years, if the patient's only systemic therapy was tamoxifen for 5 years. It also predicted no significant benefit from chemotherapy  (4) postmastectomy radiation to the left chest wall completed 08/15/2015  (5) adjuvant chemotherapy consisting of cyclophosphamide and docetaxel x4 completed on 11/21/15.  (5) anastrozole started 02/08/2016  (6) left upper extremity lymphedema  (7) signed for B-WELL study 02/08/2016  (8) participating in [place] trial as of August 2017  (a) randomized to palbociclib, starting October 2017  PLAN: Shemica is now a little more than a year and a half out from definitive surgery for her breast cancer with no evidence of disease recurrence. This is very favorable.  She continues on anastrozole with good tolerance. The plan is to continue that at minimum of 5 years.  She is also receiving palbociclib through the PALLAS trial. She tolerates that well.  She is going to return to see me next month as already scheduled. She knows to call for any problems that develop before that visit.  Chauncey Cruel, MD   01/14/2017 12:36 PM

## 2017-01-14 NOTE — Progress Notes (Signed)
01/14/2017 Patient in to clinic today for monthly visit, per MD discretion (non-protocol visit). Met with patient in lobby and confirmed that hematology counts today are acceptable for continuing with Cycle 11 treatment, due to start today. Completed Cycle 10 diaries were returned by patient today. Patient thought that she was bringing her empty Cycle 10 pill bottle, however, she picked up the Cycle 11 bottle instead. She will begin dosing with that bottle today. Will make a reminder call to patient prior to her next visit, asking her to bring the completed Cycle 11 diaries, as well as the Cycle 10 and Cycle 11 pill bottles. Patient states that she may be going to visit family in [place] for one month, anticipated to stay through the July 4th holiday. Patient's next scheduled study visit is on 02/11/2017. Explained to patient that this visit can be delayed up to one week, or 02/18/17. Patient to confirm her travel dates and will notify office if her appointment needs to be changed. Cindy S. Brigitte Pulse BSN, RN, [place] 01/14/2017 11:58 AM

## 2017-01-25 ENCOUNTER — Other Ambulatory Visit: Payer: Self-pay | Admitting: Oncology

## 2017-01-25 ENCOUNTER — Other Ambulatory Visit: Payer: Self-pay | Admitting: Adult Health

## 2017-01-25 ENCOUNTER — Telehealth: Payer: Self-pay

## 2017-01-25 ENCOUNTER — Telehealth: Payer: Self-pay | Admitting: *Deleted

## 2017-01-25 DIAGNOSIS — G4720 Circadian rhythm sleep disorder, unspecified type: Secondary | ICD-10-CM

## 2017-01-25 MED FILL — traZODone HCL 50 MG TABS: 50 | 30 days supply | Qty: 60 | Fill #0

## 2017-01-25 MED FILL — valACYclovir HCL 1 GM TABS: 1 | 45 days supply | Qty: 90 | Fill #2

## 2017-01-25 MED FILL — GABAPENTIN 300 MG CAPSULE: 300 | 30 days supply | Qty: 180 | Fill #0

## 2017-01-25 MED FILL — POTASSIUM CL ER 10 MEQ CAP: 10 | 45 days supply | Qty: 90 | Fill #1

## 2017-01-25 MED FILL — ALPRAZolam 1 MG TABS: 1 | 30 days supply | Qty: 60 | Fill #1

## 2017-01-25 MED FILL — PANTOPRAZOLE SOD DR 40 MG T: 40 | 30 days supply | Qty: 30 | Fill #1

## 2017-01-25 NOTE — Telephone Encounter (Signed)
Spoke with patient and made a new appt per Jenny Reichmann as the patient will be out of town at her regular appt time.  The patient states she will be gone over one month.

## 2017-01-25 NOTE — Telephone Encounter (Signed)
01/25/2017 02/11/17 visit rescheduled to 01/31/17 to meet requirements for the BWEL Month 12 visit. Patient was notified by scheduler, Vernetta Honey, and agreed. Left message on patient's voice mail confirming this, and also notified her that [place] study treatment could be resumed after a break, if the patient can be seen by July 27th. Gave patient my call back number [phone] if she had additional thoughts about the visit, and also noted that we can discuss it further when she is here next week. Cindy S. Brigitte Pulse BSN, RN, [place] 01/25/2017 1:42 PM

## 2017-01-25 NOTE — Telephone Encounter (Signed)
01/25/2017 Received phone call from patient today regarding her expected trip to [place]. Patient states that her daughter is coming to take her back to [place] on 01/31/17. Due to multiple family events in [place], the patient expects to be there until August. Patient notes that she will have three doses of palbociclib remaining at the time of her trip. She inquired about whether she could come in early to receive her medication supply, however, research nurse noted that the earliest date that the Cycle 12 visit with required labs could be conducted would be 02/04/17. Patient asked about having labwork done in [place], as her daughter had suggested having blood collected at [facility] and shipped to [place], however, Nutritional therapist explained study requirements for patients to have labs and physical exams conducted at the investigative site.  Plans made to review protocol restrictions on visits and allowed time off treatment and will contact study staff for follow-up and further guidance. In the meantime, patient's July 9th visit will be held until final plans for rescheduling can be arranged. Thanked patient for her call. Will also review BWEL study requirements and time frame allowed for the Month 12 visit, since this was also expected to occur on 02/11/2017. Cindy S. Brigitte Pulse BSN, RN, CCRP 01/25/2017 10:40 AM

## 2017-01-29 MED FILL — IBUPROFEN 800 MG TAB: 800 | 10 days supply | Qty: 30 | Fill #0

## 2017-01-29 NOTE — Telephone Encounter (Signed)
01/29/2017 Left voice mail message on patient's home/mobile number. Explained to her that we have been given permission by the [place] study staff to dispense investigational drug, if her labwork on Thursday is within the parameters required to meet re-treatment criteria. This will allow the patient to receive the next cycle on schedule 02/11/17, without a delay while awaiting her return to [facility] in August. Asked that patient bring her medication diaries with her, even though she will still be completing them, so that copies can be made for our records. Also asked that patient return her empty Cycle 10 palbociclib bottle and her partial Cycle 11 bottle for review. Encouraged patient to come to clinic about 30 minutes early, if possible, to allow additional time for completion of questionnaires and to expedite the visit. Cindy S. Brigitte Pulse BSN, RN, [place] 01/29/2017 2:20 PM

## 2017-01-31 ENCOUNTER — Encounter: Payer: Self-pay | Admitting: *Deleted

## 2017-01-31 ENCOUNTER — Other Ambulatory Visit (HOSPITAL_BASED_OUTPATIENT_CLINIC_OR_DEPARTMENT_OTHER): Payer: Self-pay

## 2017-01-31 ENCOUNTER — Ambulatory Visit (HOSPITAL_BASED_OUTPATIENT_CLINIC_OR_DEPARTMENT_OTHER): Payer: Self-pay | Admitting: Oncology

## 2017-01-31 VITALS — Wt 291.2 lb

## 2017-01-31 VITALS — BP 180/103 | HR 93 | Temp 98.0°F | Resp 18

## 2017-01-31 DIAGNOSIS — Z006 Encounter for examination for normal comparison and control in clinical research program: Secondary | ICD-10-CM

## 2017-01-31 DIAGNOSIS — Z170 Estrogen receptor positive status [ER+]: Secondary | ICD-10-CM

## 2017-01-31 DIAGNOSIS — C50812 Malignant neoplasm of overlapping sites of left female breast: Secondary | ICD-10-CM

## 2017-01-31 DIAGNOSIS — C50211 Malignant neoplasm of upper-inner quadrant of right female breast: Secondary | ICD-10-CM

## 2017-01-31 DIAGNOSIS — C50411 Malignant neoplasm of upper-outer quadrant of right female breast: Secondary | ICD-10-CM

## 2017-01-31 DIAGNOSIS — C50412 Malignant neoplasm of upper-outer quadrant of left female breast: Secondary | ICD-10-CM

## 2017-01-31 DIAGNOSIS — Z79811 Long term (current) use of aromatase inhibitors: Secondary | ICD-10-CM

## 2017-01-31 DIAGNOSIS — I10 Essential (primary) hypertension: Secondary | ICD-10-CM

## 2017-01-31 LAB — COMPREHENSIVE METABOLIC PANEL
ALT: 35 U/L (ref 0–55)
AST: 30 U/L (ref 5–34)
Albumin: 3.7 g/dL (ref 3.5–5.0)
Alkaline Phosphatase: 94 U/L (ref 40–150)
Anion Gap: 11 mEq/L (ref 3–11)
BUN: 11.1 mg/dL (ref 7.0–26.0)
CO2: 28 mEq/L (ref 22–29)
Calcium: 10 mg/dL (ref 8.4–10.4)
Chloride: 101 mEq/L (ref 98–109)
Creatinine: 1 mg/dL (ref 0.6–1.1)
EGFR: 74 mL/min/{1.73_m2} — ABNORMAL LOW (ref >90)
Glucose: 214 mg/dl — ABNORMAL HIGH (ref 70–140)
Potassium: 3.5 mEq/L (ref 3.5–5.1)
Sodium: 141 mEq/L (ref 136–145)
Total Bilirubin: 0.39 mg/dL (ref 0.20–1.20)
Total Protein: 8.1 g/dL (ref 6.4–8.3)

## 2017-01-31 LAB — CBC WITH DIFFERENTIAL/PLATELET
BASO%: 1.4 % (ref 0.0–2.0)
Basophils Absolute: 0.1 10*3/uL (ref 0.0–0.1)
EOS%: 1.5 % (ref 0.0–7.0)
Eosinophils Absolute: 0.1 10*3/uL (ref 0.0–0.5)
HCT: 41.1 % (ref 34.8–46.6)
HGB: 14.1 g/dL (ref 11.6–15.9)
LYMPH%: 22.8 % (ref 14.0–49.7)
MCH: 35 pg — ABNORMAL HIGH (ref 25.1–34.0)
MCHC: 34.3 g/dL (ref 31.5–36.0)
MCV: 102 fL — ABNORMAL HIGH (ref 79.5–101.0)
MONO#: 0.4 10*3/uL (ref 0.1–0.9)
MONO%: 7.1 % (ref 0.0–14.0)
NEUT#: 3.8 10*3/uL (ref 1.5–6.5)
NEUT%: 67.2 % (ref 38.4–76.8)
Platelets: 229 10*3/uL (ref 145–400)
RBC: 4.03 10*6/uL (ref 3.70–5.45)
RDW: 15.2 % — ABNORMAL HIGH (ref 11.2–14.5)
WBC: 5.7 10*3/uL (ref 3.9–10.3)
lymph#: 1.3 10*3/uL (ref 0.9–3.3)

## 2017-01-31 LAB — HEMOGLOBIN A1C
Est. average glucose Bld gHb Est-mCnc: 197 mg/dL
Hemoglobin A1c: 8.5 % — ABNORMAL HIGH (ref 4.8–5.6)

## 2017-01-31 MED ORDER — TRAMADOL HCL 50 MG PO TABS: 50.0000 mg | ORAL_TABLET | Freq: Three times a day (TID) | ORAL | 0 refills | Status: DC | PRN

## 2017-01-31 MED ORDER — INV-PALBOCICLIB 125 MG CAPS #23 ALLIANCE FOUNDATION AFT-05 (PALLAS): 125.0000 mg | ORAL_CAPSULE | Freq: Every day | ORAL | 0 refills | Status: DC

## 2017-01-31 MED ORDER — AMLODIPINE BESYLATE 5 MG PO TABS: 5.0000 mg | ORAL_TABLET | Freq: Every day | ORAL | 6 refills | Status: DC

## 2017-01-31 MED ORDER — DIPHENHYDRAMINE HCL 25 MG PO CAPS: 25.0000 mg | ORAL_CAPSULE | Freq: Four times a day (QID) | ORAL | 3 refills | Status: DC | PRN

## 2017-01-31 MED FILL — BANOPHEN 25 MG CAPSULE: 25 | 25 days supply | Qty: 100 | Fill #0

## 2017-01-31 MED FILL — AMLODIPINE BESYLATE 5 MG TA: 5 | 30 days supply | Qty: 30 | Fill #0

## 2017-01-31 NOTE — Progress Notes (Signed)
[name]  [place]  Telephone:([phone]) [phone] Fax:([phone]) [phone]   ID: Margaret [name] DOB: 08/10/1960  MR#: [id number]  ZHY#:[id number]  Patient Care Team: Margaret Koch, MD as PCP - General (Internal Medicine) Margaret Kussmaul, MD as Consulting Physician (General Surgery) Margaret [name], Margaret Dad, MD as Consulting Physician (Oncology) Margaret Pray, MD as Consulting Physician (Radiation Oncology) Margaret Kaufmann, RN as Registered Nurse Margaret Germany, RN as Registered Nurse Margaret Norway, RN as Registered Nurse PCP: Margaret Koch, MD GYN: OTHER MD:  CHIEF COMPLAINT: 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 She has been receiving medical care through the emergency room and was seen in March 2015 following a head injury, then in August 2015 for lancing of an abscess. In December 2015 she noticed that her left breast looked a little bit smaller than her right. She brought this to the attention of friends and family over the next several months but the general feeling was that most people are not perfectly symmetrical. By the summer of this year she noticed that her nipple on the left was "going in". More recently she started developing pain in the left breast. She was evaluated for this in the emergency room 04/09/2015 at which time a left breast exam showed a deformed left breast with a large hard mass encompassing most of the breast, with nipple retraction. There was no nipple discharge or bleeding and no palpable adenopathy.  The patient was referred to [facility] and on 04/13/2015 she underwent bilateral diagnostic mammography with tomosynthesis as well as bilateral breast ultrasound. The breast  density was category B. In the right breast at the 11:00 position there was a 1.3 cm irregular mass which by ultrasonography measured 1.3 cm.--In the left breast there was a 4 cm irregular mass at the 2:00 position associated with nipple retraction. There was a second, 1.7 cm area of architectural distortion at the 9:00 position. Both were palpable. By ultrasonography, the 4 cm irregular mass was noted, with a second mass measuring 2.5 cm by ultrasonography. Both axillae were benign.  On 04/19/2015 the patient underwent right breast upper outer quadrant biopsy, showing ([id number]) and invasive ductal carcinoma, grade 1, estrogen receptor 90% positive, progesterone receptor negative, with an MIB-1 of 5%, and no HER-2 amplification, the signals ratio being 1.26 and the number per cell 2.20.  On the same day, she underwent biopsy of the 2 left breast masses in question as well as a suspicious left axillary lymph node. All 3 biopsies showed invasive ductal carcinoma, grade 2, estrogen receptor 80-100% positive, progesterone receptor 80-100% positive, with the MIB-1 between 5 and 10%, and no HER-2 amplification, the signals ratio being between 1.05 and 1.13, and the number per cell between 2.10 and 2.25.  The patient's subsequent history is as detailed below  INTERVAL HISTORY: Margaret [name] returns today for follow-up and treatment of her estrogen receptor positive breast cancer. She continues on anastrozole, generally with good tolerance. She does have multiple chronic pains, chiefly involving the knees and shoulders, but is generally antedated the anastrozole. She is also on palbociclib. She feels tired, but again this is not a new problem, it was present before the start of treatment and has been a main problem with her ability to retain  jobs. In short I believe she is tolerating her treatments well.   REVIEW OF SYSTEMS:  She will be traveling to [place] this week and stating they're about a month. She is  very excited about this and is looking forward to spending time with some of the grandchildren she does not get to see as frequently. She was recently started on calcium supplementation. She is tolerating this well and it is not constipating her. Her blood pressure has been on the high side although she tells me she is taking her blood pressure medications correctly. At this point she gives me no symptoms suggestive of disease recurrence or progression. A detailed review of systems was stable.  PAST MEDICAL HISTORY: Past Medical History:  Diagnosis Date  . Bilateral breast cancer ([place])   . Breast cancer ([place])   . Breast cancer of upper-outer quadrant of left female breast ([place]) 04/21/2015  . GERD (gastroesophageal reflux disease)   . Hypertension     PAST SURGICAL HISTORY: Past Surgical History:  Procedure Laterality Date  . ABDOMINAL HYSTERECTOMY  1990  . MASTECTOMY MODIFIED RADICAL Left 05/09/2015  . MASTECTOMY MODIFIED RADICAL Left 05/09/2015   Procedure: LEFT MODIFIED RADICAL MASTETCTOMY;  Surgeon: Margaret Messing III, MD;  Location: [place];  Service: General;  Laterality: Left;  Marland Kitchen MASTECTOMY W/ SENTINEL NODE BIOPSY Right   . MASTECTOMY W/ SENTINEL NODE BIOPSY Right 05/09/2015   Procedure: RIGHT MASTECTOMY WITH RIGHT AXILLARY SENTINEL LYMPH NODE BIOPSY;  Surgeon: Margaret Messing III, MD;  Location: [place];  Service: General;  Laterality: Right;  . PORTACATH PLACEMENT Right 09/01/2015   Procedure: INSERTION PORT-A-CATH;  Surgeon: Margaret Messing III, MD;  Location: [place];  Service: General;  Laterality: Right;  . TUBAL LIGATION  1984    FAMILY HISTORY Family History  Problem Relation Age of Onset  . Hypertension Mother   . Diabetes Mother     the patient has little information on her father. Her mother is living, currently age 45. She has one brother, 2 sisters. There is no history of breast or ovarian cancer in the family to her knowledge.   GYNECOLOGIC HISTORY:  No LMP recorded.  Patient has had a hysterectomy.  menarche age 59, first live birth age 59. She is GX P4. She underwent a total abdominal hysterectomy with bilateral salpingo-oophorectomy at age 84. She did not take hormone replacement. She never used oral contraceptives.   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 She is single and lives alone  The patient's daughter  [name] works as a Programmer, applications, as does her daughter  [name]. Daughter [name] works at [employer], as a call. All 3 live in [place]. The patient's son Marc Morgans Leitzke's lives in [place]. He prepares sets for shows The patient has 4 grandchildren, no great grandchildren. She attends a [employer] in [place].     ADVANCED DIRECTIVES: Not in place. At the initial clinic visit the patient was given the appropriate forms to complete and notarize at her discretion. The patient intends to name her daughter Sampson Si as healthcare power of attorney. She can be reached at [phone].  HEALTH MAINTENANCE: Social History  Substance Use Topics  . Smoking status: Former Smoker    Packs/day: 0.10    Years: 28.00  . Smokeless tobacco: Never Used  . Alcohol use No  Colonoscopy: Never  PAP: Status post hysterectomy  Bone density: Never  Lipid panel:  Allergies  Allergen Reactions  . Latex Itching and Other (See Comments)    burning  . 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 Wheat Bran Hives    Current Outpatient Prescriptions  Medication Sig Dispense Refill  . ALPRAZolam (XANAX) 1 MG tablet Take 1 tablet (1 mg total) by mouth 2 (two) times daily as needed for anxiety. 60 tablet 1  . amLODipine (NORVASC) 5 MG tablet Take 1 tablet (5 mg total) by mouth daily. 30 tablet 6  . anastrozole (ARIMIDEX) 1 MG tablet Take 1 tablet (1 mg total) by mouth daily. 90  tablet 4  . diphenhydrAMINE (BENADRYL) 25 mg capsule Take 1 capsule (25 mg total) by mouth every 6 (six) hours as needed. 100 capsule 3  . gabapentin (NEURONTIN) 300 MG capsule Take 2 capsules (600 mg total) by mouth 3 (three) times daily. 180 capsule 1  . ibuprofen (ADVIL,MOTRIN) 800 MG tablet TAKE 1 TABLET BY MOUTH EVERY 8 HOURS AS NEEDED 30 tablet 0  . Investigational palbociclib (IBRANCE) 125 MG capsule [facility] AFT-05 PALLAS Take 1 capsule (125 mg total) by mouth daily. Take with food. Swallow whole. Do not chew. Take on days 1-21. Repeat every 28 days. 63 capsule 0  . Investigational palbociclib (IBRANCE) 125 MG capsule [facility] AFT-05 PALLAS Take 1 capsule (125 mg total) by mouth daily. Take with food. Swallow whole. Do not chew. Take on days 1-21. Repeat every 28 days. 63 capsule 0  . losartan-hydrochlorothiazide (HYZAAR) 100-25 MG tablet Take 1 tablet by mouth daily. 90 tablet 3  . pantoprazole (PROTONIX) 40 MG tablet TAKE 1 TABLET BY MOUTH ONCE DAILY 30 tablet 1  . potassium chloride (MICRO-K) 10 MEQ CR capsule Take 1 capsule (10 mEq total) by mouth 2 (two) times daily. 90 capsule 4  . traMADol (ULTRAM) 50 MG tablet Take 1 tablet (50 mg total) by mouth every 8 (eight) hours as needed. 60 tablet 0  . traZODone (DESYREL) 50 MG tablet TAKE 1 TABLET BY MOUTH AT BEDTIME. MAY INCREASE TO 2 TABLETS AFTER A FEW DAYS IF NEEDED 60 tablet 0  . valACYclovir (VALTREX) 1000 MG tablet Take 1 tablet (1,000 mg total) by mouth 2 (two) times daily. 90 tablet 4  . venlafaxine XR (EFFEXOR-XR) 150 MG 24 hr capsule Take 1 capsule (150 mg total) by mouth daily with breakfast. 90 capsule 1   No current facility-administered medications for this visit.       OBJECTIVE: Middle-aged African-American woman Who appears stated age  24:   01/31/17 1052  BP: (!) 180/103  Pulse: 93  Resp: 18  Temp: 98 F (36.7 C)     There is no height or weight on file to calculate BMI.    ECOG FS:1 -  Symptomatic but completely ambulatory   Sclerae unicteric, pupils round and equal Oropharynx clear and moist No cervical or supraclavicular adenopathy Lungs no rales or rhonchi Heart regular rate and rhythm Abd soft, obese, nontender, positive bowel sounds MSK no focal spinal tenderness, no upper extremity lymphedema Neuro: nonfocal, well oriented, appropriate affect Breasts: Status post bilateral mastectomies. No evidence of local recurrence. Both axillae are benign.   LAB RESULTS:  CMP     Component Value Date/Time   NA 141 01/31/2017 1034   K 3.5 01/31/2017 1034   CL 102 06/29/2015 0923   CO2 28 01/31/2017 1034  GLUCOSE 214 (H) 01/31/2017 1034   BUN 11.1 01/31/2017 1034   CREATININE 1.0 01/31/2017 1034   CALCIUM 10.0 01/31/2017 1034   PROT 8.1 01/31/2017 1034   ALBUMIN 3.7 01/31/2017 1034   AST 30 01/31/2017 1034   ALT 35 01/31/2017 1034   ALKPHOS 94 01/31/2017 1034   BILITOT 0.39 01/31/2017 1034   GFRNONAA >60 06/29/2015 0923   GFRAA >60 06/29/2015 0923    INo results found for: SPEP, UPEP  Lab Results  Component Value Date   WBC 5.7 01/31/2017   NEUTROABS 3.8 01/31/2017   HGB 14.1 01/31/2017   HCT 41.1 01/31/2017   MCV 102.0 (H) 01/31/2017   PLT 229 01/31/2017      Chemistry      Component Value Date/Time   NA 141 01/31/2017 1034   K 3.5 01/31/2017 1034   CL 102 06/29/2015 0923   CO2 28 01/31/2017 1034   BUN 11.1 01/31/2017 1034   CREATININE 1.0 01/31/2017 1034   GLU 157 (H) 08/17/2016 0818      Component Value Date/Time   CALCIUM 10.0 01/31/2017 1034   ALKPHOS 94 01/31/2017 1034   AST 30 01/31/2017 1034   ALT 35 01/31/2017 1034   BILITOT 0.39 01/31/2017 1034       No results found for: LABCA2  No components found for: LABCA125  No results for input(s): INR in the last 168 hours.  Urinalysis    Component Value Date/Time   COLORURINE YELLOW 06/29/2015 1030   APPEARANCEUR CLOUDY (A) 06/29/2015 1030   LABSPEC 1.019 06/29/2015 1030    PHURINE 7.0 06/29/2015 1030   GLUCOSEU NEGATIVE 06/29/2015 1030   HGBUR TRACE (A) 06/29/2015 1030   BILIRUBINUR NEGATIVE 06/29/2015 1030   KETONESUR NEGATIVE 06/29/2015 1030   PROTEINUR NEGATIVE 06/29/2015 1030   NITRITE NEGATIVE 06/29/2015 1030   LEUKOCYTESUR MODERATE (A) 06/29/2015 1030    STUDIES:  No results found.  ASSESSMENT: 56 y.o. [place] woman with synchronous breast cancers, as follows  (a) status post right breast upper outer quadrant biopsy 04/19/2015 for a clinical pT1c N0, stage IA invasive ductal carcinoma, grade 1, estrogen receptor positive, progesterone receptor negative, with an MIB-1 of 5%, and no HER-2 amplification  (b) status post left breast upper outer quadrant biopsy 2 and left axillary lymph node biopsy 04/19/2015 for a clinical T3 N1, stage IIIA invasive ductal carcinoma, grade 1 or 2, estrogen receptor and progesterone receptor positive, HER-2 negative, with an MIB-1 between 5 and 10%  (1) status post bilateral mastectomies 05/09/2015, showing:   (a) on the right side, a pT2 pN0, stage IIA invasive ductal carcinoma, with negative margins and repeat HER-2 again negative    (b) on the left, a pT3 pN2, stage IIIA invasive ductal carcinoma, grade 2, with negative margins, and repeat HER-2 again negative  (2) Mammaprint from the left-sided tumor returned "luminal type, low risk", predicting a small benefit from chemotherapy with the important caveat that N2 disease was not included in the MINDACT study--patient opted for chemotherapy  (3) Oncotype from the right-sided tumor showed a score of 12, predicting an 8% risk of recurrence outside the breast within the next 10 years, if the patient's only systemic therapy was tamoxifen for 5 years. It also predicted no significant benefit from chemotherapy  (4) postmastectomy radiation to the left chest wall completed 08/15/2015  (5) adjuvant chemotherapy consisting of cyclophosphamide and docetaxel x4 completed on  11/21/15.  (5) anastrozole started 02/08/2016  (6) left upper extremity lymphedema  (7) signed for  B-WELL study 02/08/2016  (8) participating in [place] trial as of August 2017  (a) randomized to palbociclib, starting October 2017  PLAN: Elliyah is now a year and a half out from definitive surgery for her breast cancer with no evidence of disease recurrence. This is very favorable.  She continues to have excellent counts on the initial dose of palbociclib and we have made no changes there. She tolerates that and anastrozole with no significant adverse events.  The one major concern today is her blood pressure. I am starting her on amlodipine which will work well with her current medications. We would normally follow up on this next week but she is going to be in [place]. I suggested she get her blood pressure checked there is a relative has a blood pressure gauge. Otherwise she will return to see [place] in September.  She knows to call for any problems that may develop before that visit.  Chauncey Cruel, MD   01/31/2017 8:11 PM

## 2017-01-31 NOTE — Progress Notes (Signed)
01/31/2017 Patient in to clinic today for routine visit, which had to be rescheduled from expected date of 02/11/2017, due to patient's upcoming travel out of town for an extended visit with her family in [place]. This visit falls within the expected +/- 14-day window allowed on the [facility] [id number] BWEL study, however, it is out of range for the expected visit for [facility] AFT-05 PALLAS Cycle 12. Per 01/25/2017 approval from AFT CRA and medical monitor, visit allowed to proceed with drug dispensing, provided retreatment parameters are met and that patient is counseled regarding required 7-day break between cycles.        [facility]iance AFT-05 PALLAS Cycle 12 visit (conducted at [place])    Upon arrival to the clinic, questionnaires were completed independently by the patient. Patient proceeded to lab for collection of standard of care blood samples as well as protocol-required HgbA1c. Patient returned her empty palbociclib bottle for Cycle 10, and this was returned to pharmacist, Henreitta Leber, for proper accountability. She did not return the bottle for cycle 11, but states that she has three doses remaining. Diaries for cycles 9 and 10 were returned by the patient at prior visits. A copy of the partially-completed (in progress) Cycle 11 diaries were retained by the site, with the originals returned to the patient for completion. The patient was given  a postage-paid, pre-addressed envelope for the Research Department to mail the diaries in, once completed. Based on lab results review and history and physical by Dr. Jana Hakim, patient condition is acceptable for continued treatment. Patient was given copies of diaries for both medications for Cycles 12-14, along with three bottles of study medication for the next three cycles. Reviewed study medication and anti-hormone therapy instructions with patient; she will continue daily dosing with anastrozole, and following completion of current cycle of palbociclib, she will wait  until 02/11/17 to begin the next cycle of dosing with IP. Patient is expected to return to clinic in 5 weeks for re-evaluation, per MD preference. See table below regarding adverse events reporting.  Alliance [phone] BWEL Month 12 Upon arrival to the clinic, the Follow-up questionnaire was completed independently by the patient. Patient denies the occurrence of any fractures, sprains, tendon/ligament injuries or orthopedic surgeries in the past six months since the last BWEL study visit in January. Weight and waist and hip circumference measurements were obtained per protocol, and following the instructions in the BWEL Weight and Height Protocol document. Patient continues to receive study-related information and indicates that it is helpful to her. The next study visit is expected to occur in January. Told patient that the fasting lab sample collection is not expected to occur until one year from now, and that she will be reminded to fast when it is required.  Cindy S. Brigitte Pulse BSN, RN, [place] 01/31/2017 2:35 PM   Adverse Event Log PALLAS Cycles 9-11: 11/19/2016--01/31/2017 (Cycle 11, Day 18) Event Grade Onset Date Resolved Date Attribution to palbociclib Attribution to anastrozole Treatment Comments  Dry skin Grade 1 04/04/2016 ongoing No Yes    Itching Grade 1 04/04/2016 ongoing No No    Hypokalemia Grade 1 04/12/2016 12/20/2016 No No  Inc to Grade 3  Hypokalemia Grade 3 12/20/2016 12/27/2016 No No  KCl supplement   Flu-like symptoms Grade 2 04/19/2016 ongoing Yes No    Joint pain Grade 2 04/19/2016 ongoing No No Tramadol   Elevated glucose,int. Grade 1 05/30/2016 ongoing No No    Hypertension Grade 3 05/30/2016 ongoing No No Hyzaar Added amlodipine  Depression  Grade 2 07/17/2016 ongoing No No    Decreased concentration Grade 1 07/17/2016 ongoing Yes Yes    Vaginal dryness Grade 2 09/2016 ongoing No Yes    Peripheral neuropathy Grade 2 11/19/2016 ongoing No No    Fatigue Grade 1 12/20/2016 ongoing Yes No     Insomnia Grade 2 12/20/16 ongoing No No Trazodone   Allergic rhinitis Grade 2 6/UN/2018 ongoing No No Benadryl   Out of range hematology and serum bicarbonate lab results were not considered clinically significant. Cindy S. Brigitte Pulse BSN, RN, [place] 03/20/2017 11:06 AM

## 2017-02-11 ENCOUNTER — Other Ambulatory Visit: Payer: Self-pay

## 2017-02-11 ENCOUNTER — Ambulatory Visit: Payer: Self-pay | Admitting: Oncology

## 2017-02-18 ENCOUNTER — Encounter: Payer: Self-pay | Admitting: *Deleted

## 2017-02-18 DIAGNOSIS — Z170 Estrogen receptor positive status [ER+]: Principal | ICD-10-CM

## 2017-02-18 DIAGNOSIS — C50412 Malignant neoplasm of upper-outer quadrant of left female breast: Secondary | ICD-10-CM

## 2017-02-18 NOTE — Progress Notes (Signed)
02/18/2017 Received via [place] mail, completed Cycle 11 palbociclib and anastrozole diaries, postmarked 02/07/2017. Cindy S. Brigitte Pulse BSN, RN, [place] 02/18/2017 2:10 PM

## 2017-02-26 ENCOUNTER — Other Ambulatory Visit: Payer: Self-pay | Admitting: Oncology

## 2017-02-26 MED FILL — IBUPROFEN 800 MG TAB: 800 | 10 days supply | Qty: 30 | Fill #0

## 2017-02-26 MED FILL — traMADol HCL 50 MG TABS: 50 | 20 days supply | Qty: 60 | Fill #0

## 2017-03-13 ENCOUNTER — Telehealth: Payer: Self-pay

## 2017-03-13 ENCOUNTER — Telehealth: Payer: Self-pay | Admitting: Oncology

## 2017-03-13 NOTE — Telephone Encounter (Signed)
lvm to inform pt of r/s appt to 8/23 at 1230 per sch msg. Unable to r/s appt to 8/9 due to GM oput of office and LC has no open time slots

## 2017-03-13 NOTE — Telephone Encounter (Signed)
Pt called to change her Friday appt to Thursday d/t transportation issues. LOS sent.

## 2017-03-15 ENCOUNTER — Other Ambulatory Visit: Payer: Self-pay | Admitting: Oncology

## 2017-03-15 ENCOUNTER — Encounter: Payer: Self-pay | Admitting: *Deleted

## 2017-03-15 ENCOUNTER — Ambulatory Visit: Payer: Self-pay | Admitting: Adult Health

## 2017-03-15 ENCOUNTER — Other Ambulatory Visit: Payer: Self-pay

## 2017-03-15 DIAGNOSIS — Z170 Estrogen receptor positive status [ER+]: Principal | ICD-10-CM

## 2017-03-15 DIAGNOSIS — C50412 Malignant neoplasm of upper-outer quadrant of left female breast: Secondary | ICD-10-CM

## 2017-03-15 DIAGNOSIS — G4720 Circadian rhythm sleep disorder, unspecified type: Secondary | ICD-10-CM

## 2017-03-15 DIAGNOSIS — C50211 Malignant neoplasm of upper-inner quadrant of right female breast: Secondary | ICD-10-CM

## 2017-03-15 MED ORDER — ALPRAZOLAM 1 MG PO TABS: 1.0000 mg | ORAL_TABLET | Freq: Two times a day (BID) | ORAL | 1 refills | Status: DC | PRN

## 2017-03-15 MED ORDER — PANTOPRAZOLE SODIUM 40 MG PO TBEC: 40.0000 mg | DELAYED_RELEASE_TABLET | Freq: Every day | ORAL | 3 refills | Status: DC

## 2017-03-15 MED ORDER — VENLAFAXINE HCL ER 150 MG PO CP24: 150.0000 mg | ORAL_CAPSULE | Freq: Every day | ORAL | 3 refills | Status: DC

## 2017-03-15 MED ORDER — IBUPROFEN 800 MG PO TABS: 800.0000 mg | ORAL_TABLET | Freq: Three times a day (TID) | ORAL | 3 refills | Status: DC | PRN

## 2017-03-15 MED ORDER — TRAZODONE HCL 50 MG PO TABS: ORAL_TABLET | ORAL | 1 refills | Status: DC

## 2017-03-15 MED FILL — IBUPROFEN 800 MG TAB: 800 | 30 days supply | Qty: 90 | Fill #0

## 2017-03-15 MED FILL — AMLODIPINE BESYLATE 5 MG TA: 5 | 30 days supply | Qty: 30 | Fill #1

## 2017-03-15 MED FILL — PANTOPRAZOLE SOD DR 40 MG T: 40 | 30 days supply | Qty: 30 | Fill #0

## 2017-03-15 MED FILL — traZODone HCL 50 MG TABS: 50 | 30 days supply | Qty: 60 | Fill #0

## 2017-03-15 MED FILL — VENLAFAXINE HCL ER 150 MG C: 150 | 90 days supply | Qty: 90 | Fill #0

## 2017-03-15 NOTE — Progress Notes (Signed)
03/15/2017 Patient in to clinic today for expected appointment, however, appointment was moved to 03/28/17 after patient's request for rescheduling this week. Patient asked to meet with research nurse to return papers and pill bottles. Met with patient in lobby who states that she had a pretty good time on her vacation to [place]. She is here today because she needs some medication refills and follow-up on knee pain. Notified Rogue Jury, Dr. Virgie Dad nurse, of patient's request, explaining to patient that Dr. Jana Hakim is not in the office today. Patient returned empty palbociclib bottles for Cycles 11 and 12, and confirms that she began dosing with Cycle 13 bottle as instructed. Bottles were returned to pharmacist Raul Del for drug accountability. Patient also returned completed diaries for Cycle 12 today. Thanked patient for mailing her Cycle 11 diaries in as instructed.  Patient was given a copy of her last HgbA1c results. She states that she needs to schedule a follow-up visit with her PCP, Dr. Sharlet Salina, and patient was encouraged to contact the office soon for an appointment and to discuss the results with her MD.  Inquired about calcium supplementation as noted in the office visit note dated 01/31/17, however, patient states that she has not in fact started any calcium supplements, and wonders if she should do so. Encouraged patient to discuss this further with her provider. Thanked patient for her study participation. Cindy S. Brigitte Pulse BSN, RN, [place] 03/15/2017 11:43 AM

## 2017-03-19 ENCOUNTER — Ambulatory Visit: Payer: Self-pay | Admitting: Nurse Practitioner

## 2017-03-28 ENCOUNTER — Ambulatory Visit (INDEPENDENT_AMBULATORY_CARE_PROVIDER_SITE_OTHER)
Admission: RE | Admit: 2017-03-28 | Discharge: 2017-03-28 | Disposition: A | Discharge status: Home / Self Care | Payer: Self-pay | Source: Ambulatory Visit | Attending: Nurse Practitioner | Admitting: Nurse Practitioner

## 2017-03-28 ENCOUNTER — Ambulatory Visit (INDEPENDENT_AMBULATORY_CARE_PROVIDER_SITE_OTHER): Payer: Self-pay | Admitting: Nurse Practitioner

## 2017-03-28 ENCOUNTER — Other Ambulatory Visit (HOSPITAL_BASED_OUTPATIENT_CLINIC_OR_DEPARTMENT_OTHER): Payer: Self-pay

## 2017-03-28 ENCOUNTER — Other Ambulatory Visit: Payer: Self-pay | Admitting: Nurse Practitioner

## 2017-03-28 ENCOUNTER — Telehealth: Payer: Self-pay | Admitting: Internal Medicine

## 2017-03-28 ENCOUNTER — Ambulatory Visit (HOSPITAL_BASED_OUTPATIENT_CLINIC_OR_DEPARTMENT_OTHER): Payer: Self-pay | Admitting: Adult Health

## 2017-03-28 ENCOUNTER — Encounter: Payer: Self-pay | Admitting: Nurse Practitioner

## 2017-03-28 VITALS — BP 132/86 | HR 87 | Temp 98.5°F | Ht 69.0 in | Wt 287.0 lb

## 2017-03-28 VITALS — BP 132/93 | HR 84 | Temp 98.2°F | Resp 18 | Ht 69.0 in | Wt 288.2 lb

## 2017-03-28 DIAGNOSIS — E119 Type 2 diabetes mellitus without complications: Secondary | ICD-10-CM

## 2017-03-28 DIAGNOSIS — M25461 Effusion, right knee: Secondary | ICD-10-CM

## 2017-03-28 DIAGNOSIS — C50812 Malignant neoplasm of overlapping sites of left female breast: Secondary | ICD-10-CM

## 2017-03-28 DIAGNOSIS — C50412 Malignant neoplasm of upper-outer quadrant of left female breast: Secondary | ICD-10-CM

## 2017-03-28 DIAGNOSIS — Z79811 Long term (current) use of aromatase inhibitors: Secondary | ICD-10-CM

## 2017-03-28 DIAGNOSIS — M25561 Pain in right knee: Secondary | ICD-10-CM

## 2017-03-28 DIAGNOSIS — C50211 Malignant neoplasm of upper-inner quadrant of right female breast: Secondary | ICD-10-CM

## 2017-03-28 DIAGNOSIS — Z170 Estrogen receptor positive status [ER+]: Secondary | ICD-10-CM

## 2017-03-28 DIAGNOSIS — I10 Essential (primary) hypertension: Secondary | ICD-10-CM

## 2017-03-28 DIAGNOSIS — C50411 Malignant neoplasm of upper-outer quadrant of right female breast: Secondary | ICD-10-CM

## 2017-03-28 LAB — COMPREHENSIVE METABOLIC PANEL
ALT: 42 U/L (ref 0–55)
AST: 28 U/L (ref 5–34)
Albumin: 3.6 g/dL (ref 3.5–5.0)
Alkaline Phosphatase: 90 U/L (ref 40–150)
Anion Gap: 9 mEq/L (ref 3–11)
BUN: 21.2 mg/dL (ref 7.0–26.0)
CO2: 30 mEq/L — ABNORMAL HIGH (ref 22–29)
Calcium: 10.3 mg/dL (ref 8.4–10.4)
Chloride: 100 mEq/L (ref 98–109)
Creatinine: 1.3 mg/dL — ABNORMAL HIGH (ref 0.6–1.1)
EGFR: 52 mL/min/{1.73_m2} — ABNORMAL LOW (ref >90)
Glucose: 155 mg/dl — ABNORMAL HIGH (ref 70–140)
Potassium: 3.2 mEq/L — ABNORMAL LOW (ref 3.5–5.1)
Sodium: 139 mEq/L (ref 136–145)
Total Bilirubin: 0.26 mg/dL (ref 0.20–1.20)
Total Protein: 8.2 g/dL (ref 6.4–8.3)

## 2017-03-28 LAB — CBC WITH DIFFERENTIAL/PLATELET
BASO%: 1.5 % (ref 0.0–2.0)
Basophils Absolute: 0.1 10*3/uL (ref 0.0–0.1)
EOS%: 1.2 % (ref 0.0–7.0)
Eosinophils Absolute: 0.1 10*3/uL (ref 0.0–0.5)
HCT: 41.5 % (ref 34.8–46.6)
HGB: 13.8 g/dL (ref 11.6–15.9)
LYMPH%: 32.4 % (ref 14.0–49.7)
MCH: 34.2 pg — ABNORMAL HIGH (ref 25.1–34.0)
MCHC: 33.3 g/dL (ref 31.5–36.0)
MCV: 103 fL — ABNORMAL HIGH (ref 79.5–101.0)
MONO#: 0.5 10*3/uL (ref 0.1–0.9)
MONO%: 8.2 % (ref 0.0–14.0)
NEUT#: 3.7 10*3/uL (ref 1.5–6.5)
NEUT%: 56.7 % (ref 38.4–76.8)
Platelets: 248 10*3/uL (ref 145–400)
RBC: 4.03 10*6/uL (ref 3.70–5.45)
RDW: 13.7 % (ref 11.2–14.5)
WBC: 6.5 10*3/uL (ref 3.9–10.3)
lymph#: 2.1 10*3/uL (ref 0.9–3.3)

## 2017-03-28 MED ORDER — TRAMADOL HCL 50 MG PO TABS: 50.0000 mg | ORAL_TABLET | Freq: Three times a day (TID) | ORAL | 0 refills | Status: DC | PRN

## 2017-03-28 MED ORDER — VALACYCLOVIR HCL 1 G PO TABS: 1000.0000 mg | ORAL_TABLET | Freq: Two times a day (BID) | ORAL | 4 refills | Status: DC

## 2017-03-28 MED ORDER — ALPRAZOLAM 1 MG PO TABS: 1.0000 mg | ORAL_TABLET | Freq: Two times a day (BID) | ORAL | 1 refills | Status: DC | PRN

## 2017-03-28 MED ORDER — METFORMIN HCL 500 MG PO TABS: 500.0000 mg | ORAL_TABLET | Freq: Two times a day (BID) | ORAL | 2 refills | Status: DC

## 2017-03-28 MED FILL — ALPRAZolam 1 MG TABS: 1 | 30 days supply | Qty: 60 | Fill #0

## 2017-03-28 MED FILL — valACYclovir HCL 1 GM TABS: 1 | 45 days supply | Qty: 90 | Fill #0

## 2017-03-28 MED FILL — traMADol HCL 50 MG TABS: 50 | 20 days supply | Qty: 60 | Fill #0

## 2017-03-28 MED FILL — POTASSIUM CL ER 10 MEQ CAP: 10 | 45 days supply | Qty: 90 | Fill #2

## 2017-03-28 MED FILL — metFORMIN HCL 500 MG TABS: 500 | 30 days supply | Qty: 60 | Fill #0

## 2017-03-28 NOTE — Telephone Encounter (Signed)
Pt called in and would like someone to call her about her xray results

## 2017-03-28 NOTE — Progress Notes (Signed)
[name]  [facility]  [phone]) [phone] Fax:(336) [phone]   ID: SHARILYNN CASSITY DOB: November 20, 1960  MR#: [id number]  IFO#:[id number]  Patient Care Team: Hoyt Koch, MD as PCP - General (Internal Medicine) Jovita Kussmaul, MD as Consulting Physician (General Surgery) Magrinat, Virgie Dad, MD as Consulting Physician (Oncology) Gery Pray, MD as Consulting Physician (Radiation Oncology) Mauro Kaufmann, RN as Registered Nurse Rockwell Germany, RN as Registered Nurse Benson Norway, RN as Registered Nurse PCP: Hoyt Koch, MD GYN: OTHER MD:  CHIEF COMPLAINT: 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 She has been receiving medical care through the emergency room and was seen in March 2015 following a head injury, then in August 2015 for lancing of an abscess. In December 2015 she noticed that her left breast looked a little bit smaller than her right. She brought this to the attention of friends and family over the next several months but the general feeling was that most people are not perfectly symmetrical. By the summer of this year she noticed that her nipple on the left was "going in". More recently she started developing pain in the left breast. She was evaluated for this in the emergency room 04/09/2015 at which time a left breast exam showed a deformed left breast with a large hard mass encompassing most of the breast, with nipple retraction. There was no nipple discharge or bleeding and no palpable adenopathy.  The patient was referred to [facility] and on 04/13/2015 she underwent bilateral diagnostic mammography with tomosynthesis as well as bilateral breast ultrasound. The breast  density was category B. In the right breast at the 11:00 position there was a 1.3 cm irregular mass which by ultrasonography measured 1.3 cm.--In the left breast there was a 4 cm irregular mass at the 2:00 position associated with nipple retraction. There was a second, 1.7 cm area of architectural distortion at the 9:00 position. Both were palpable. By ultrasonography, the 4 cm irregular mass was noted, with a second mass measuring 2.5 cm by ultrasonography. Both axillae were benign.  On 04/19/2015 the patient underwent right breast upper outer quadrant biopsy, showing ([id number]) and invasive ductal carcinoma, grade 1, estrogen receptor 90% positive, progesterone receptor negative, with an MIB-1 of 5%, and no HER-2 amplification, the signals ratio being 1.26 and the number per cell 2.20.  On the same day, she underwent biopsy of the 2 left breast masses in question as well as a suspicious left axillary lymph node. All 3 biopsies showed invasive ductal carcinoma, grade 2, estrogen receptor 80-100% positive, progesterone receptor 80-100% positive, with the MIB-1 between 5 and 10%, and no HER-2 amplification, the signals ratio being between 1.05 and 1.13, and the number per cell between 2.10 and 2.25.  The patient's subsequent history is as detailed below  INTERVAL HISTORY: Margaret [name] returns today for follow-up and treatment of her estrogen receptor positive breast cancer. She is doing well today.  She is having some right knee pain.  She was recently placed on Metformin.  She is having issues with her right knee.  Its been progressively getting worse since her last appointment with Dr. Jana Hakim.  She says her right knee is swollen.  Her left knee hurts also, but the right knee is  worse.  She is currently undergoing evaluation by her PCP office, [name], NP.  She underwent xray today and will f/u with them for the results and management.  She has considered holding the anastrozole she is taking daily,  but has not yet, and is not ready to at this time.     REVIEW OF SYSTEMS:  Other than what is noted above, a detailed ROS was non contributory.    PAST MEDICAL HISTORY: Past Medical History:  Diagnosis Date  . Bilateral breast cancer ([place])   . Breast cancer ([place])   . Breast cancer of upper-outer quadrant of left female breast ([place]) 04/21/2015  . GERD (gastroesophageal reflux disease)   . Hypertension     PAST SURGICAL HISTORY: Past Surgical History:  Procedure Laterality Date  . ABDOMINAL HYSTERECTOMY  1990  . MASTECTOMY MODIFIED RADICAL Left 05/09/2015  . MASTECTOMY MODIFIED RADICAL Left 05/09/2015   Procedure: LEFT MODIFIED RADICAL MASTETCTOMY;  Surgeon: Autumn Messing III, MD;  Location: [place] [facility];  Service: General;  Laterality: Left;  Marland Kitchen MASTECTOMY W/ SENTINEL NODE BIOPSY Right   . MASTECTOMY W/ SENTINEL NODE BIOPSY Right 05/09/2015   Procedure: RIGHT MASTECTOMY WITH RIGHT AXILLARY SENTINEL LYMPH NODE BIOPSY;  Surgeon: Autumn Messing III, MD;  Location: [place];  Service: General;  Laterality: Right;  . PORTACATH PLACEMENT Right 09/01/2015   Procedure: INSERTION PORT-A-CATH;  Surgeon: Autumn Messing III, MD;  Location: [facility];  Service: General;  Laterality: Right;  . TUBAL LIGATION  1984    FAMILY HISTORY Family History  Problem Relation Age of Onset  . Hypertension Mother   . Diabetes Mother     the patient has little information on her father. Her mother is living, currently age 39. She has one brother, 2 sisters. There is no history of breast or ovarian cancer in the family to her knowledge.   GYNECOLOGIC HISTORY:  No LMP recorded. Patient has had a hysterectomy.  menarche age 18, first live birth age 8. She is GX P4. She underwent a total abdominal hysterectomy with bilateral salpingo-oophorectomy at age 79. She did not take hormone replacement. She never used oral contraceptives.   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 She is single and lives alone  The patient's daughter  [name] works as a Programmer, applications, as does her daughter  [name]. Daughter [name] works at [employer], as a call. All 3 live in [place]. The patient's son Marc Morgans Hedtke's lives in [place]. He prepares sets for shows The patient has 4 grandchildren, no great grandchildren. She attends a [employer] in [place].     ADVANCED DIRECTIVES: Not in place. At the initial clinic visit the patient was given the appropriate forms to complete and notarize at her discretion. The patient intends to name her daughter Sampson Si as healthcare power of attorney. She can be reached at [phone].  HEALTH MAINTENANCE: Social History  Substance Use Topics  . Smoking status: Former Smoker    Packs/day: 0.10    Years: 28.00  . Smokeless tobacco: Never Used  . Alcohol use No     Colonoscopy: Never  PAP: Status post hysterectomy  Bone density: Never  Lipid panel:  Allergies  Allergen Reactions  . Banana Hives    Tongue itching  . Latex Itching and Other (See Comments)    burning  . 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 Wheat Bran Hives    Current Outpatient Prescriptions  Medication Sig Dispense Refill  . ALPRAZolam (XANAX) 1 MG tablet Take 1 tablet (1 mg total) by mouth 2 (two) times daily as needed for anxiety. 60 tablet 1  . amLODipine (NORVASC) 5 MG tablet Take 1 tablet (5 mg total) by mouth daily. 30 tablet 6  . anastrozole (ARIMIDEX) 1 MG tablet Take 1 tablet (1 mg total) by mouth daily. 90 tablet 4  . diphenhydrAMINE (BENADRYL) 25 mg capsule Take 1 capsule (25 mg total) by mouth every 6 (six) hours as needed. 100 capsule 3  . gabapentin (NEURONTIN) 300 MG capsule Take 2 capsules (600 mg total) by mouth 3 (three) times daily. 180 capsule 1  . ibuprofen (ADVIL,MOTRIN) 800 MG tablet Take 1  tablet (800 mg total) by mouth every 8 (eight) hours as needed. 90 tablet 3  . Investigational palbociclib (IBRANCE) 125 MG capsule [employer] AFT-05 PALLAS Take 1 capsule (125 mg total) by mouth daily. Take with food. Swallow whole. Do not chew. Take on days 1-21. Repeat every 28 days. 63 capsule 0  . losartan-hydrochlorothiazide (HYZAAR) 100-25 MG tablet Take 1 tablet by mouth daily. 90 tablet 3  . metFORMIN (GLUCOPHAGE) 500 MG tablet Take 1 tablet (500 mg total) by mouth 2 (two) times daily with a meal. 60 tablet 2  . pantoprazole (PROTONIX) 40 MG tablet Take 1 tablet (40 mg total) by mouth daily. 30 tablet 3  . potassium chloride (MICRO-K) 10 MEQ CR capsule Take 1 capsule (10 mEq total) by mouth 2 (two) times daily. 90 capsule 4  . traMADol (ULTRAM) 50 MG tablet Take 1 tablet (50 mg total) by mouth every 8 (eight) hours as needed. 60 tablet 0  . traZODone (DESYREL) 50 MG tablet TAKE 1 TABLET BY MOUTH AT BEDTIME. MAY INCREASE TO 2 TABLETS AFTER A FEW DAYS IF NEEDED 60 tablet 1  . valACYclovir (VALTREX) 1000 MG tablet Take 1 tablet (1,000 mg total) by mouth 2 (two) times daily. 90 tablet 4  . venlafaxine XR (EFFEXOR-XR) 150 MG 24 hr capsule Take 1 capsule (150 mg total) by mouth daily with breakfast. 90 capsule 3   No current facility-administered medications for this visit.       OBJECTIVE:   Vitals:   03/28/17 1247  BP: (!) 132/93  Pulse: 84  Resp: 18  Temp: 98.2 F (36.8 C)  SpO2: 99%     Body mass index is 42.56 kg/m.    ECOG FS:1 - Symptomatic but completely ambulatory  GENERAL: Patient is a well appearing female in no acute distress HEENT:  Sclerae anicteric.  Oropharynx clear and moist. No ulcerations or evidence of oropharyngeal candidiasis. Neck is supple.  NODES:  No cervical, supraclavicular, or axillary lymphadenopathy palpated.  BREAST EXAM: left breast surgically absent, there is scar tissue present, it is tender, she does have seroma near her left axilla.   Right breast without any nodules, masses, skin or nipple changes LUNGS:  Clear to auscultation bilaterally.  No wheezes or rhonchi. HEART:  Regular rate and rhythm. No murmur appreciated. ABDOMEN:  Soft, nontender.  Positive, normoactive bowel sounds. No organomegaly palpated. MSK:  No focal spinal tenderness to palpation. Limited ROM in the left arm.  Right knee is slightly swollen, no tenderness to palpation EXTREMITIES:  No peripheral edema.   SKIN:  Clear with no obvious rashes or skin changes. No nail dyscrasia.  NEURO:  Nonfocal. Well oriented.  Appropriate affect.      LAB RESULTS:  CMP     Component Value Date/Time   NA 139 03/28/2017 1225   K 3.2 (L) 03/28/2017 1225   CL 102 06/29/2015 0923   CO2 30 (H) 03/28/2017 1225   GLUCOSE 155 (H) 03/28/2017 1225   BUN 21.2 03/28/2017 1225   CREATININE 1.3 (H) 03/28/2017 1225   CALCIUM 10.3 03/28/2017 1225   PROT 8.2 03/28/2017 1225   ALBUMIN 3.6 03/28/2017 1225   AST 28 03/28/2017 1225   ALT 42 03/28/2017 1225   ALKPHOS 90 03/28/2017 1225   BILITOT 0.26 03/28/2017 1225   GFRNONAA >60 06/29/2015 0923   GFRAA >60 06/29/2015 0923    INo results found for: SPEP, UPEP  Lab Results  Component Value Date   WBC 6.5 03/28/2017   NEUTROABS 3.7 03/28/2017   HGB 13.8 03/28/2017   HCT 41.5 03/28/2017   MCV 103.0 (H) 03/28/2017   PLT 248 03/28/2017      Chemistry      Component Value Date/Time   NA 139 03/28/2017 1225   K 3.2 (L) 03/28/2017 1225   CL 102 06/29/2015 0923   CO2 30 (H) 03/28/2017 1225   BUN 21.2 03/28/2017 1225   CREATININE 1.3 (H) 03/28/2017 1225   GLU 157 (H) 08/17/2016 0818      Component Value Date/Time   CALCIUM 10.3 03/28/2017 1225   ALKPHOS 90 03/28/2017 1225   AST 28 03/28/2017 1225   ALT 42 03/28/2017 1225   BILITOT 0.26 03/28/2017 1225       No results found for: LABCA2  No components found for: [id number]  No results for input(s): INR in the last 168 hours.  Urinalysis    Component  Value Date/Time   COLORURINE YELLOW 06/29/2015 1030   APPEARANCEUR CLOUDY (A) 06/29/2015 1030   LABSPEC 1.019 06/29/2015 1030   PHURINE 7.0 06/29/2015 1030   GLUCOSEU NEGATIVE 06/29/2015 1030   HGBUR TRACE (A) 06/29/2015 1030   BILIRUBINUR NEGATIVE 06/29/2015 1030   KETONESUR NEGATIVE 06/29/2015 1030   PROTEINUR NEGATIVE 06/29/2015 1030   NITRITE NEGATIVE 06/29/2015 1030   LEUKOCYTESUR MODERATE (A) 06/29/2015 1030    STUDIES:  No results found.  ASSESSMENT: 56 y.o. [place] woman with synchronous breast cancers, as follows  (a) status post right breast upper outer quadrant biopsy 04/19/2015 for a clinical pT1c N0, stage IA invasive ductal carcinoma, grade 1, estrogen receptor positive, progesterone receptor negative, with an MIB-1 of 5%, and no HER-2 amplification  (b) status post left breast upper outer quadrant biopsy 2 and left axillary lymph node biopsy 04/19/2015 for a clinical T3 N1, stage IIIA invasive ductal carcinoma, grade 1 or 2, estrogen receptor and progesterone receptor positive, HER-2 negative, with an MIB-1 between 5 and 10%  (1) status post bilateral mastectomies 05/09/2015, showing:   (a) on the right side, a pT2 pN0, stage IIA invasive ductal carcinoma, with negative margins and repeat HER-2 again negative    (b) on the left, a pT3 pN2, stage IIIA invasive ductal carcinoma, grade 2, with negative margins, and repeat HER-2 again negative  (2) Mammaprint from the left-sided tumor returned "luminal type, low risk", predicting a small benefit from chemotherapy with the important caveat that N2 disease was not included in the MINDACT study--patient opted for chemotherapy  (3) Oncotype from the right-sided tumor showed a score of 12, predicting an 8% risk of recurrence outside the breast within the next 10 years, if the patient's  only systemic therapy was tamoxifen for 5 years. It also predicted no significant benefit from chemotherapy  (4) postmastectomy radiation to  the left chest wall completed 08/15/2015  (5) adjuvant chemotherapy consisting of cyclophosphamide and docetaxel x4 completed on 11/21/15.  (5) anastrozole started 02/08/2016  (6) left upper extremity lymphedema  (7) signed for B-WELL study 02/08/2016  (8) participating in [place] trial as of August 2017  (a) randomized to palbociclib, starting October 2017  PLAN: Margaret [name] is doing well today.  She will continue with Anastrozole/Palbociclib per the protocol.  Her labs are normal today.  She will continue to f/u with her PCP about her knee.  She and I briefly discussed an Anastrozole holiday, but she is waiting on that.  She will continue on Metformin, and she is tolerating this well.  She will return on 05/06/2017 for labs, and follow up per protocol.  She does note that she has not seen Dr. Marlou Starks in quite some time and does have some issues since her surgery to talk to him about.  I will send him a message.    She knows to call for any problems that may develop before that visit.  A total of (30) minutes of face-to-face time was spent with this patient with greater than 50% of that time in counseling and care-coordination.   Scot Dock, NP   03/28/2017 1:22 PM

## 2017-03-28 NOTE — Progress Notes (Signed)
Subjective:  Patient ID: Margaret [name], female    DOB: May 15, 1961  Age: 56 y.o. MRN: [id number]  CC: Follow-up (blood suger consult---Dr. Jana Hakim is concern it is going up/knees painful when get up and down. )   Diabetes  She presents for her initial diabetic visit. She has type 2 diabetes mellitus. No MedicAlert identification noted. Her disease course has been worsening. There are no hypoglycemic associated symptoms. Pertinent negatives for diabetes include no chest pain, no fatigue, no foot paresthesias, no foot ulcerations, no polydipsia, no polyphagia, no polyuria, no visual change, no weakness and no weight loss. There are no diabetic complications. Risk factors for coronary artery disease include family history, hypertension, sedentary lifestyle and diabetes mellitus. When asked about current treatments, none were reported. Her weight is stable. When asked about meal planning, she reported none. She has not had a previous visit with a dietitian. She never participates in exercise. An ACE inhibitor/angiotensin II receptor blocker is being taken. She does not see a podiatrist.Eye exam is not current.  Knee Pain   The incident occurred more than 1 week ago. There was no injury mechanism. The pain is present in the right knee. The quality of the pain is described as aching. The pain is severe. The pain has been worsening since onset. Associated symptoms include an inability to bear weight and a loss of motion. Pertinent negatives include no loss of sensation, muscle weakness, numbness or tingling. She has tried NSAIDs, rest and non-weight bearing (and tramadol) for the symptoms. The treatment provided mild relief.  arimidex started 02/2016 Onset of knee pain over 88months ago but ongoing <1year.  Outpatient Medications Prior to Visit  Medication Sig Dispense Refill  . amLODipine (NORVASC) 5 MG tablet Take 1 tablet (5 mg total) by mouth daily. 30 tablet 6  . anastrozole (ARIMIDEX) 1 MG tablet  Take 1 tablet (1 mg total) by mouth daily. 90 tablet 4  . diphenhydrAMINE (BENADRYL) 25 mg capsule Take 1 capsule (25 mg total) by mouth every 6 (six) hours as needed. 100 capsule 3  . gabapentin (NEURONTIN) 300 MG capsule Take 2 capsules (600 mg total) by mouth 3 (three) times daily. 180 capsule 1  . ibuprofen (ADVIL,MOTRIN) 800 MG tablet Take 1 tablet (800 mg total) by mouth every 8 (eight) hours as needed. 90 tablet 3  . Investigational palbociclib (IBRANCE) 125 MG capsule [employer] AFT-05 PALLAS Take 1 capsule (125 mg total) by mouth daily. Take with food. Swallow whole. Do not chew. Take on days 1-21. Repeat every 28 days. 63 capsule 0  . losartan-hydrochlorothiazide (HYZAAR) 100-25 MG tablet Take 1 tablet by mouth daily. 90 tablet 3  . pantoprazole (PROTONIX) 40 MG tablet Take 1 tablet (40 mg total) by mouth daily. 30 tablet 3  . potassium chloride (MICRO-K) 10 MEQ CR capsule Take 1 capsule (10 mEq total) by mouth 2 (two) times daily. 90 capsule 4  . traZODone (DESYREL) 50 MG tablet TAKE 1 TABLET BY MOUTH AT BEDTIME. MAY INCREASE TO 2 TABLETS AFTER A FEW DAYS IF NEEDED 60 tablet 1  . venlafaxine XR (EFFEXOR-XR) 150 MG 24 hr capsule Take 1 capsule (150 mg total) by mouth daily with breakfast. 90 capsule 3  . ALPRAZolam (XANAX) 1 MG tablet Take 1 tablet (1 mg total) by mouth 2 (two) times daily as needed for anxiety. 60 tablet 1  . traMADol (ULTRAM) 50 MG tablet Take 1 tablet (50 mg total) by mouth every 8 (eight) hours as needed. [place]  tablet 0  . valACYclovir (VALTREX) 1000 MG tablet Take 1 tablet (1,000 mg total) by mouth 2 (two) times daily. 90 tablet 4   No facility-administered medications prior to visit.     ROS See HPI  Objective:  BP 132/86   Pulse 87   Temp 98.5 F (36.9 C)   Ht 5\' 9"  (1.753 m)   Wt 287 lb (130.2 kg)   SpO2 98%   BMI 42.38 kg/m   BP Readings from Last 3 Encounters:  03/28/17 (!) 132/93  03/28/17 132/86  01/31/17 (!) 180/103    Wt Readings  from Last 3 Encounters:  03/28/17 288 lb 3.2 oz (130.7 kg)  03/28/17 287 lb (130.2 kg)  01/31/17 291 lb 3.2 oz (132.1 kg)    Physical Exam  Constitutional: She is oriented to person, place, and time. No distress.  Cardiovascular: Normal rate.   Pulmonary/Chest: Effort normal.  Musculoskeletal: She exhibits edema and tenderness. She exhibits no deformity.       Right knee: She exhibits decreased range of motion, swelling and effusion. She exhibits no erythema. Tenderness found. Medial joint line, lateral joint line and patellar tendon tenderness noted.       Right ankle: Normal.       Right upper leg: Normal.       Right lower leg: Normal.       Right foot: Normal.  Neurological: She is alert and oriented to person, place, and time.  Skin: Skin is warm and dry. No erythema.  Vitals reviewed.   Lab Results  Component Value Date   WBC 6.5 03/28/2017   HGB 13.8 03/28/2017   HCT 41.5 03/28/2017   PLT 248 03/28/2017   GLUCOSE 155 (H) 03/28/2017   ALT 42 03/28/2017   AST 28 03/28/2017   NA 139 03/28/2017   K 3.2 (L) 03/28/2017   CL 102 06/29/2015   CREATININE 1.3 (H) 03/28/2017   BUN 21.2 03/28/2017   CO2 30 (H) 03/28/2017   HGBA1C 8.5 (H) 01/31/2017    Dg Hip Unilat W Or W/o Pelvis 1 View Left  Result Date: 05/31/2016 CLINICAL DATA:  56 year old female with breast cancer in left hip pain for 3-4 months. No known injury. EXAM: DG HIP (WITH OR WITHOUT PELVIS) 1V*L* COMPARISON:  None. FINDINGS: No fracture or dislocation. No plain film evidence of osseous metastatic disease or avascular necrosis. If there is persistent or progressive pain and further delineation is clinically desired MR imaging may be considered. IMPRESSION: Negative plain film exam of the left hip as noted above. Electronically Signed   By: Genia Del M.D.   On: 05/31/2016 08:22    Assessment & Plan:   Margaret [name] was seen today for follow-up.  Diagnoses and all orders for this visit:  Type 2 diabetes mellitus  without complication, without long-term current use of insulin (HCC) -     Discontinue: metFORMIN (GLUCOPHAGE) 500 MG tablet; Take 1 tablet (500 mg total) by mouth 2 (two) times daily with a meal.  Hypertension, essential  Pain and swelling of right knee -     Cancel: DG Knee Complete 4 Views Right; Future   I am having Margaret [name] maintain her potassium chloride, anastrozole, gabapentin, losartan-hydrochlorothiazide, diphenhydrAMINE, amLODipine, Investigational palbociclib, ibuprofen, pantoprazole, traZODone, and venlafaxine XR.  Meds ordered this encounter  Medications  . DISCONTD: metFORMIN (GLUCOPHAGE) 500 MG tablet    Sig: Take 1 tablet (500 mg total) by mouth 2 (two) times daily with a meal.    Dispense:  60 tablet    Refill:  2    Order Specific Question:   Supervising Provider    Answer:   Cassandria Anger [[id number]]    Follow-up: Return in about 3 months (around 06/28/2017) for with Dr. Sharlet Salina.  [name], NP

## 2017-03-28 NOTE — Patient Instructions (Addendum)
Start Metformin as prescribed.  X-ray does not show arthritis, but does show knee effusion. This could be related to current medication: Arimidex  We can try joint aspiration and injection with lidocaine and depomedrol, and Vitamin D OTC 4000 IU daily for 2 weeks then 2000 IU daily thereafter. If no improvement in 2weeks, please f/up with oncology to discuss possible side effects of Arimidex.  If she agrees to joint injection, please schedule with Dr. Raeford Razor for tomorrow. He is aware.  Diabetes Mellitus and Food It is important for you to manage your blood sugar (glucose) level. Your blood glucose level can be greatly affected by what you eat. Eating healthier foods in the appropriate amounts throughout the day at about the same time each day will help you control your blood glucose level. It can also help slow or prevent worsening of your diabetes mellitus. Healthy eating may even help you improve the level of your blood pressure and reach or maintain a healthy weight. General recommendations for healthful eating and cooking habits include:  Eating meals and snacks regularly. Avoid going long periods of time without eating to lose weight.  Eating a diet that consists mainly of plant-based foods, such as fruits, vegetables, nuts, legumes, and whole grains.  Using low-heat cooking methods, such as baking, instead of high-heat cooking methods, such as deep frying.  Work with your dietitian to make sure you understand how to use the Nutrition Facts information on food labels. How can food affect me? Carbohydrates Carbohydrates affect your blood glucose level more than any other type of food. Your dietitian will help you determine how many carbohydrates to eat at each meal and teach you how to count carbohydrates. Counting carbohydrates is important to keep your blood glucose at a healthy level, especially if you are using insulin or taking certain medicines for diabetes  mellitus. Alcohol Alcohol can cause sudden decreases in blood glucose (hypoglycemia), especially if you use insulin or take certain medicines for diabetes mellitus. Hypoglycemia can be a life-threatening condition. Symptoms of hypoglycemia (sleepiness, dizziness, and disorientation) are similar to symptoms of having too much alcohol. If your health care provider has given you approval to drink alcohol, do so in moderation and use the following guidelines:  Women should not have more than one drink per day, and men should not have more than two drinks per day. One drink is equal to: ? 12 oz of beer. ? 5 oz of wine. ? 1 oz of hard liquor.  Do not drink on an empty stomach.  Keep yourself hydrated. Have water, diet soda, or unsweetened iced tea.  Regular soda, juice, and other mixers might contain a lot of carbohydrates and should be counted.  What foods are not recommended? As you make food choices, it is important to remember that all foods are not the same. Some foods have fewer nutrients per serving than other foods, even though they might have the same number of calories or carbohydrates. It is difficult to get your body what it needs when you eat foods with fewer nutrients. Examples of foods that you should avoid that are high in calories and carbohydrates but low in nutrients include:  Trans fats (most processed foods list trans fats on the Nutrition Facts label).  Regular soda.  Juice.  Candy.  Sweets, such as cake, pie, doughnuts, and cookies.  Fried foods.  What foods can I eat? Eat nutrient-rich foods, which will nourish your body and keep you healthy. The food you should eat  also will depend on several factors, including:  The calories you need.  The medicines you take.  Your weight.  Your blood glucose level.  Your blood pressure level.  Your cholesterol level.  You should eat a variety of foods, including:  Protein. ? Lean cuts of meat. ? Proteins low in  saturated fats, such as fish, egg whites, and beans. Avoid processed meats.  Fruits and vegetables. ? Fruits and vegetables that may help control blood glucose levels, such as apples, mangoes, and yams.  Dairy products. ? Choose fat-free or low-fat dairy products, such as milk, yogurt, and cheese.  Grains, bread, pasta, and rice. ? Choose whole grain products, such as multigrain bread, whole oats, and brown rice. These foods may help control blood pressure.  Fats. ? Foods containing healthful fats, such as nuts, avocado, olive oil, canola oil, and fish.  Does everyone with diabetes mellitus have the same meal plan? Because every person with diabetes mellitus is different, there is not one meal plan that works for everyone. It is very important that you meet with a dietitian who will help you create a meal plan that is just right for you. This information is not intended to replace advice given to you by your health care provider. Make sure you discuss any questions you have with your health care provider. Document Released: 04/19/2005 Document Revised: 12/29/2015 Document Reviewed: 06/19/2013 Elsevier Interactive Patient Education  2017 [employer].   Diabetes Mellitus and Exercise Exercising regularly is important for your overall health, especially when you have diabetes (diabetes mellitus). Exercising is not only about losing weight. It has many health benefits, such as increasing muscle strength and bone density and reducing body fat and stress. This leads to improved fitness, flexibility, and endurance, all of which result in better overall health. Exercise has additional benefits for people with diabetes, including: Reducing appetite. Helping to lower and control blood glucose. Lowering blood pressure. Helping to control amounts of fatty substances (lipids) in the blood, such as cholesterol and triglycerides. Helping the body to respond better to insulin (improving insulin  sensitivity). Reducing how much insulin the body needs. Decreasing the risk for heart disease by: Lowering cholesterol and triglyceride levels. Increasing the levels of good cholesterol. Lowering blood glucose levels.  What is my activity plan? Your health care provider or certified diabetes educator can help you make a plan for the type and frequency of exercise (activity plan) that works for you. Make sure that you: Do at least 150 minutes of moderate-intensity or vigorous-intensity exercise each week. This could be brisk walking, biking, or water aerobics. Do stretching and strength exercises, such as yoga or weightlifting, at least 2 times a week. Spread out your activity over at least 3 days of the week. Get some form of physical activity every day. Do not go more than 2 days in a row without some kind of physical activity. Avoid being inactive for more than 90 minutes at a time. Take frequent breaks to walk or stretch. Choose a type of exercise or activity that you enjoy, and set realistic goals. Start slowly, and gradually increase the intensity of your exercise over time.  What do I need to know about managing my diabetes? Check your blood glucose before and after exercising. If your blood glucose is higher than 240 mg/dL (13.3 mmol/L) before you exercise, check your urine for ketones. If you have ketones in your urine, do not exercise until your blood glucose returns to normal. Know the symptoms  of low blood glucose (hypoglycemia) and how to treat it. Your risk for hypoglycemia increases during and after exercise. Common symptoms of hypoglycemia can include: Hunger. Anxiety. Sweating and feeling clammy. Confusion. Dizziness or feeling light-headed. Increased heart rate or palpitations. Blurry vision. Tingling or numbness around the mouth, lips, or tongue. Tremors or shakes. Irritability. Keep a rapid-acting carbohydrate snack available before, during, and after exercise to  help prevent or treat hypoglycemia. Avoid injecting insulin into areas of the body that are going to be exercised. For example, avoid injecting insulin into: The arms, when playing tennis. The legs, when jogging. Keep records of your exercise habits. Doing this can help you and your health care provider adjust your diabetes management plan as needed. Write down: Food that you eat before and after you exercise. Blood glucose levels before and after you exercise. The type and amount of exercise you have done. When your insulin is expected to peak, if you use insulin. Avoid exercising at times when your insulin is peaking. When you start a new exercise or activity, work with your health care provider to make sure the activity is safe for you, and to adjust your insulin, medicines, or food intake as needed. Drink plenty of water while you exercise to prevent dehydration or heat stroke. Drink enough fluid to keep your urine clear or pale yellow. This information is not intended to replace advice given to you by your health care provider. Make sure you discuss any questions you have with your health care provider. Document Released: 10/13/2003 Document Revised: 02/10/2016 Document Reviewed: 01/02/2016 Elsevier Interactive Patient Education  2018 [employer].

## 2017-03-28 NOTE — Patient Instructions (Signed)

## 2017-03-29 ENCOUNTER — Encounter: Payer: Self-pay | Admitting: Adult Health

## 2017-03-29 ENCOUNTER — Other Ambulatory Visit: Payer: Self-pay

## 2017-03-29 ENCOUNTER — Ambulatory Visit (INDEPENDENT_AMBULATORY_CARE_PROVIDER_SITE_OTHER): Payer: Self-pay | Admitting: Family Medicine

## 2017-03-29 ENCOUNTER — Encounter: Payer: Self-pay | Admitting: Family Medicine

## 2017-03-29 VITALS — BP 130/80 | HR 100 | Temp 98.5°F | Ht 69.0 in | Wt 288.0 lb

## 2017-03-29 DIAGNOSIS — M25561 Pain in right knee: Secondary | ICD-10-CM

## 2017-03-29 LAB — GRAM STAIN
Gram Stain: NONE SEEN
Gram Stain: NONE SEEN

## 2017-03-29 NOTE — Assessment & Plan Note (Signed)
Her symptoms could be associated with chronic degenerative meniscal tear versus osteoarthritis. She doesn't have any history of gout. Could be associated with her aromatase inhibitor but less likely. - Injection and aspiration today. - Ace wrap provided - Gram stain and cell count - If no improvement may need formal physical therapy and/or MRI - Medications and follow-up.

## 2017-03-29 NOTE — Telephone Encounter (Signed)
Pt given Xray results, she expressed understanding and will be coming here today to receive Injection from [facility],

## 2017-03-29 NOTE — Assessment & Plan Note (Signed)
Advised and counseled on weight loss today. - Could consider aquatic therapy - Could refer for further counseling

## 2017-03-29 NOTE — Patient Instructions (Addendum)
Thank you for coming in,   Please try to wrap your knee. Please try to keep ice on the knee. Please try riding a bike for exercise. Please try lateral leg lifts to help with your     Please feel free to call with any questions or concerns at any time, at [phone]. --Dr. Raeford Razor

## 2017-03-29 NOTE — Progress Notes (Signed)
Margaret [name] - 56 y.o. female MRN [id number]  Date of birth: 12/18/1960  SUBJECTIVE:  Including CC & ROS.  Chief Complaint  Patient presents with  . Injections    right knee injection   Margaret [name] is a 56 year old female is presenting with right knee pain. She reports that the knee started hurting recently. She denies any injury to the knee. Has been swollen. There is locking and popping. She feels like the knee will give out. She denies any prior injury or surgery to her knee. Has tried different medications with no improvement. She reports that her left knee hurts as well so she feels like she may be putting more pressure on her right knee. The pain is localized in nature. The pain is throbbing. She does have some pain in the back of her knee and is unable to flex her knee completely.   She is morbidly obese and counseled her on this. She has gained some weight since her cancer therapy was initiated. She has not been working out like she wants to.   I have independently reviewed X-rays from 03/28/17 of her knee. This shows some minimal medial joint space narrowing.   Review of Systems  Cardiovascular: Negative for leg swelling.  Musculoskeletal: Positive for joint swelling. Negative for gait problem and myalgias.  Skin: Negative for color change.  Neurological: Negative for weakness and numbness.   otherwise negative  HISTORY: Past Medical, Surgical, Social, and Family History Reviewed & Updated per EMR.   Pertinent Historical Findings include:  Past Medical History:  Diagnosis Date  . Bilateral breast cancer ([place])   . Breast cancer ([place])   . Breast cancer of upper-outer quadrant of left female breast ([place]) 04/21/2015  . GERD (gastroesophageal reflux disease)   . Hypertension     Past Surgical History:  Procedure Laterality Date  . ABDOMINAL HYSTERECTOMY  1990  . MASTECTOMY MODIFIED RADICAL Left 05/09/2015  . MASTECTOMY MODIFIED RADICAL Left 05/09/2015   Procedure: LEFT  MODIFIED RADICAL MASTETCTOMY;  Surgeon: Autumn Messing III, MD;  Location: [facility];  Service: General;  Laterality: Left;  Marland Kitchen MASTECTOMY W/ SENTINEL NODE BIOPSY Right   . MASTECTOMY W/ SENTINEL NODE BIOPSY Right 05/09/2015   Procedure: RIGHT MASTECTOMY WITH RIGHT AXILLARY SENTINEL LYMPH NODE BIOPSY;  Surgeon: Autumn Messing III, MD;  Location: [facility];  Service: General;  Laterality: Right;  . PORTACATH PLACEMENT Right 09/01/2015   Procedure: INSERTION PORT-A-CATH;  Surgeon: Autumn Messing III, MD;  Location: [facility];  Service: General;  Laterality: Right;  . TUBAL LIGATION  1984    Allergies  Allergen Reactions  . Banana Hives    Tongue itching  . Latex Itching and Other (See Comments)    burning  . 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 Wheat Bran Hives    Family History  Problem Relation Age of Onset  . Hypertension Mother   . Diabetes Mother      Social History   Social History  . Marital status: Single    Spouse name: N/A  . Number of children: 4  . Years of education: N/A   Occupational History  . Not on file.   Social History Main Topics  . Smoking status: Former Smoker    Packs/day: 0.10    Years: 28.00  . Smokeless tobacco: Never Used  . Alcohol use No  . Drug use: No  . Sexual activity: Yes  Birth control/ protection: Surgical   Other Topics Concern  . Not on file   Social History Narrative  . No narrative on file     PHYSICAL EXAM:  VS: BP 130/80 (BP Location: Right Wrist, Patient Position: Sitting, Cuff Size: Large)   Pulse 100   Temp 98.5 F (36.9 C) (Oral)   Ht 5\' 9"  (1.753 m)   Wt 288 lb (130.6 kg)   SpO2 98%   BMI 42.53 kg/m  Physical Exam Gen: NAD, alert, cooperative with exam, well-appearing ENT: normal lips, normal nasal mucosa,  Eye: normal EOM, normal conjunctiva and lids CV:  no edema, +2 pedal pulses   Resp: no accessory muscle use, non-labored,  GI: no masses or tenderness, no hernia  Skin:  no rashes, no areas of induration  Neuro: normal tone, normal sensation to touch Psych:  normal insight, alert and oriented MSK:  Effusion appreciated. Some tenderness to palpation of the medial joint line. Flexion limited to about 110. Normal extension. Normal valgus and varus testing. Some pain with McMurray's testing. Neurovascular intact.   Aspiration/Injection Procedure Note TIMOTHY TOWNSEL [birth year]  Procedure: Aspiration and Injection Indications: Right knee pain  Procedure Details Consent: Risks of procedure as well as the alternatives and risks of each were explained to the (patient/caregiver).  Consent for procedure obtained. Time Out: Verified patient identification, verified procedure, site/side was marked, verified correct patient position, special equipment/implants available, medications/allergies/relevent history reviewed, required imaging and test results available.  Performed.  The area was cleaned with iodine and alcohol swabs.    The right knee joint was anesthetized in the superior lateral aspect with 3 mL of 1% lidocaine without epinephrine. An 18-gauge needle was inserted into the superior lateral aspect of the right knee and 25 mL of bloody tinged yellow fluid was aspirated. This was done under ultrasound and images were captured. The right knee joint was injected using 1 cc's of 40 mg Depomedrol and 4 cc's of 1% lidocaine with a 18 1 1/2" needle.      Amount of Fluid Aspirated: 57mL Character of Fluid: straw colored Fluid was sent [facility] count, culture, crystal identification, protein and glucose A sterile dressing was applied.  Patient did tolerate procedure well.              ASSESSMENT & PLAN:   Morbid obesity ([place]) Advised and counseled on weight loss today. - Could consider aquatic therapy - Could refer for further counseling  Acute pain of right knee Her symptoms could be associated with chronic degenerative meniscal tear versus  osteoarthritis. She doesn't have any history of gout. Could be associated with her aromatase inhibitor but less likely. - Injection and aspiration today. - Ace wrap provided - Gram stain and cell count - If no improvement may need formal physical therapy and/or MRI - Medications and follow-up.

## 2017-03-29 NOTE — Telephone Encounter (Signed)
Pt saw Baldo Ash on 03/28/17 will forward msg to her LPN to contact pt...Margaret [name]

## 2017-03-30 LAB — SYNOVIAL CELL COUNT + DIFF, W/ CRYSTALS
Basophils, %: 0 %
Eosinophils-Synovial: 0 % (ref 0–2)
Lymphocytes-Synovial Fld: 17 % (ref 0–74)
Monocyte/Macrophage: 54 % (ref 0–69)
Neutrophil, Synovial: 29 % — ABNORMAL HIGH (ref 0–24)
Synoviocytes, %: 0 % (ref 0–15)
WBC, Synovial: 35 cells/uL (ref <150)

## 2017-04-01 ENCOUNTER — Ambulatory Visit: Payer: Self-pay | Admitting: Adult Health

## 2017-04-01 ENCOUNTER — Telehealth: Payer: Self-pay

## 2017-04-01 ENCOUNTER — Other Ambulatory Visit: Payer: Self-pay

## 2017-04-01 NOTE — Telephone Encounter (Signed)
Left patient voice message concerning upcoming appointment for 10/1. Per los

## 2017-04-04 MED FILL — PANTOPRAZOLE SOD DR 40 MG T: 40 | 30 days supply | Qty: 30 | Fill #1

## 2017-04-04 MED FILL — AMLODIPINE BESYLATE 5 MG TA: 5 | 30 days supply | Qty: 30 | Fill #2

## 2017-04-04 MED FILL — traZODone HCL 50 MG TABS: 50 | 30 days supply | Qty: 60 | Fill #1

## 2017-04-04 MED FILL — IBUPROFEN 800 MG TAB: 800 | 30 days supply | Qty: 90 | Fill #1

## 2017-04-04 MED FILL — BANOPHEN 25 MG CAPSULE: 25 | 25 days supply | Qty: 100 | Fill #1

## 2017-04-22 MED FILL — LOSARTAN-HCTZ 100-25 MG TAB: 100-25 | 90 days supply | Qty: 90 | Fill #0

## 2017-05-06 ENCOUNTER — Telehealth: Payer: Self-pay | Admitting: Adult Health

## 2017-05-06 ENCOUNTER — Encounter: Payer: Self-pay | Admitting: *Deleted

## 2017-05-06 ENCOUNTER — Ambulatory Visit (HOSPITAL_BASED_OUTPATIENT_CLINIC_OR_DEPARTMENT_OTHER): Payer: Self-pay | Admitting: Adult Health

## 2017-05-06 ENCOUNTER — Other Ambulatory Visit (HOSPITAL_BASED_OUTPATIENT_CLINIC_OR_DEPARTMENT_OTHER): Payer: Self-pay

## 2017-05-06 DIAGNOSIS — C50412 Malignant neoplasm of upper-outer quadrant of left female breast: Secondary | ICD-10-CM

## 2017-05-06 DIAGNOSIS — C50211 Malignant neoplasm of upper-inner quadrant of right female breast: Secondary | ICD-10-CM

## 2017-05-06 DIAGNOSIS — Z170 Estrogen receptor positive status [ER+]: Principal | ICD-10-CM

## 2017-05-06 DIAGNOSIS — C50411 Malignant neoplasm of upper-outer quadrant of right female breast: Secondary | ICD-10-CM

## 2017-05-06 DIAGNOSIS — Z006 Encounter for examination for normal comparison and control in clinical research program: Secondary | ICD-10-CM

## 2017-05-06 DIAGNOSIS — Z79811 Long term (current) use of aromatase inhibitors: Secondary | ICD-10-CM

## 2017-05-06 LAB — COMPREHENSIVE METABOLIC PANEL
ALT: 34 U/L (ref 0–55)
AST: 28 U/L (ref 5–34)
Albumin: 4 g/dL (ref 3.5–5.0)
Alkaline Phosphatase: 88 U/L (ref 40–150)
Anion Gap: 12 mEq/L — ABNORMAL HIGH (ref 3–11)
BUN: 12 mg/dL (ref 7.0–26.0)
CO2: 29 mEq/L (ref 22–29)
Calcium: 10.1 mg/dL (ref 8.4–10.4)
Chloride: 101 mEq/L (ref 98–109)
Creatinine: 0.9 mg/dL (ref 0.6–1.1)
EGFR: 85 mL/min/{1.73_m2} — ABNORMAL LOW (ref >90)
Glucose: 135 mg/dl (ref 70–140)
Potassium: 3.1 mEq/L — ABNORMAL LOW (ref 3.5–5.1)
Sodium: 142 mEq/L (ref 136–145)
Total Bilirubin: 0.28 mg/dL (ref 0.20–1.20)
Total Protein: 8.6 g/dL — ABNORMAL HIGH (ref 6.4–8.3)

## 2017-05-06 LAB — CBC WITH DIFFERENTIAL/PLATELET
BASO%: 2 % (ref 0.0–2.0)
Basophils Absolute: 0.1 10*3/uL (ref 0.0–0.1)
EOS%: 0.8 % (ref 0.0–7.0)
Eosinophils Absolute: 0.1 10*3/uL (ref 0.0–0.5)
HCT: 43.6 % (ref 34.8–46.6)
HGB: 14.6 g/dL (ref 11.6–15.9)
LYMPH%: 23.1 % (ref 14.0–49.7)
MCH: 34.5 pg — ABNORMAL HIGH (ref 25.1–34.0)
MCHC: 33.5 g/dL (ref 31.5–36.0)
MCV: 103 fL — ABNORMAL HIGH (ref 79.5–101.0)
MONO#: 0.7 10*3/uL (ref 0.1–0.9)
MONO%: 9.5 % (ref 0.0–14.0)
NEUT#: 4.5 10*3/uL (ref 1.5–6.5)
NEUT%: 64.6 % (ref 38.4–76.8)
Platelets: 198 10*3/uL (ref 145–400)
RBC: 4.24 10*6/uL (ref 3.70–5.45)
RDW: 14.8 % — ABNORMAL HIGH (ref 11.2–14.5)
WBC: 7 10*3/uL (ref 3.9–10.3)
lymph#: 1.6 10*3/uL (ref 0.9–3.3)

## 2017-05-06 MED ORDER — CALCIUM CARBONATE-VITAMIN D 500-200 MG-UNIT PO TABS
1.0000 | ORAL_TABLET | Freq: Two times a day (BID) | ORAL | 0 refills | Status: DC
Start: 2017-05-06 — End: 2023-07-02

## 2017-05-06 MED ORDER — INV-PALBOCICLIB 125 MG CAPS #23 ALLIANCE FOUNDATION AFT-05 (PALLAS): 125.0000 mg | ORAL_CAPSULE | Freq: Every day | ORAL | 0 refills | Status: DC

## 2017-05-06 MED ORDER — TRAMADOL HCL 50 MG PO TABS: 50.0000 mg | ORAL_TABLET | Freq: Three times a day (TID) | ORAL | 0 refills | Status: DC | PRN

## 2017-05-06 MED FILL — POTASSIUM CL ER 10 MEQ CAP: 10 | 45 days supply | Qty: 90 | Fill #3

## 2017-05-06 MED FILL — BANOPHEN 25 MG CAPSULE: 25 | 25 days supply | Qty: 100 | Fill #2

## 2017-05-06 MED FILL — IBUPROFEN 800 MG TAB: 800 | 30 days supply | Qty: 90 | Fill #2

## 2017-05-06 MED FILL — ALPRAZolam 1 MG TABS: 1 | 30 days supply | Qty: 60 | Fill #1

## 2017-05-06 MED FILL — traMADol HCL 50 MG TABS: 50 | 20 days supply | Qty: 60 | Fill #0

## 2017-05-06 MED FILL — valACYclovir HCL 1 GM TABS: 1 | 30 days supply | Qty: 60 | Fill #1

## 2017-05-06 MED FILL — CALCIUM 600 + VIT D 400 TAB: 600-400 | 75 days supply | Qty: 150 | Fill #0

## 2017-05-06 MED FILL — metFORMIN HCL 500 MG TABS: 500 | 30 days supply | Qty: 60 | Fill #1

## 2017-05-06 MED FILL — AMLODIPINE BESYLATE 5 MG TA: 5 | 30 days supply | Qty: 30 | Fill #3

## 2017-05-06 MED FILL — PANTOPRAZOLE SOD DR 40 MG T: 40 | 30 days supply | Qty: 30 | Fill #2

## 2017-05-06 NOTE — Patient Instructions (Signed)

## 2017-05-06 NOTE — Progress Notes (Signed)
05/06/2017 Patient in to clinic today for Month 15 study assessments including collection of standard of care lab samples. Patient returned her empty palbociclib bottles for Cycles 13 and 14, and these were returned to pharmacist Carolynne Edouard for proper accountability. Completed medication diaries for cycles Cycles 13 and 14 were returned by the patient today. Three bottles of study medication (palbociclib) were dispensed to the patient today to begin dosing Cycle 15 today. Patient was given copies of diaries for anti-hormone therapy medication and for palbociclib for Cycles 15-17. Patient reports diarrhea that occurred during the first two weeks she was on Metformin. She reports that she had at most two stools per 24 hour period during that time (grade 1). She hopes that her mood will improve as she is intending to go back to work, this time at [employer]. She reports that her previously reported flu-like symptoms have been resolved for about 2 months, but other events, as outlined in the table below are ongoing. Cindy S. Brigitte Pulse BSN, RN, [place] 05/06/2017 2:25 PM  Adverse Event Log [facility] Cycles 13-15: 01/31/2017 - 05/06/2017 Event Grade Onset Date Resolved Date Attribution to palbociclib Attribution to [email or web address] Treatment Comments  Dry skin Grade 1 04/04/2016 ongoing No Yes    Itching Grade 1 04/04/2016 ongoing No No    Flu-like symptoms Grade 2 04/19/2016 03/06/2017 Yes No    Joint pain Grade 2 04/19/2016 ongoing No No Tramadol 10/1 improved to grade 1  Elevated glucose,int. Grade 1 05/30/2016 12/27/2016 No No    Elevated glucose, int. Grade 3 12/27/2016 03/28/2017 No No    Hypertension Grade 3 05/30/2016 ongoing No No Hyzaar Added amlodipine  Depression Grade 2 07/17/2016 ongoing No No    Decreased concentration Grade 1 07/17/2016 ongoing Yes Yes    Vaginal dryness Grade 2 09/2016 ongoing No Yes Coconut oil   Peripheral neuropathy Grade 2 11/19/2016 ongoing No No    Fatigue Grade 1 12/20/2016 ongoing  Yes No    Insomnia Grade 2 12/20/16 ongoing No No Trazodone   Allergic rhinitis Grade 2 6/UN/2018 ongoing No No Benadryl   Type 2 DM Grade 2 03/28/2017 ongoing No No Metformin   Right knee pain Grade 3 03/28/2017 05/06/2017 No No procedure 10/1 improved to grade 1  Hypokalemia Grade 1 03/28/2017 ongoing No No  Continued supplement  Elevated creatinine Grade 1 03/28/2017 05/06/2017 No No None   Diarrhea Grade 1 03/28/2017 04/11/2017 No No None   Cindy S. Brigitte Pulse BSN, RN, CCRP 05/30/2017 1:08 PM

## 2017-05-06 NOTE — Progress Notes (Signed)
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 She has been receiving medical care through the emergency room and was seen in March 2015 following a head injury, then in August 2015 for lancing of an abscess. In December 2015 she noticed that her left breast looked a little bit smaller than her right. She brought this to the attention of friends and family over the next several months but the general feeling was that most people are not perfectly symmetrical. By the summer of this year she noticed that her nipple on the left was "going in". More recently she started developing pain in the left breast. She was evaluated for this in the emergency room 04/09/2015 at which time a left breast exam showed a deformed left breast with a large hard mass encompassing most of the breast, with nipple retraction. There was no nipple discharge or bleeding and no palpable adenopathy.  The patient was referred to [facility] and on 04/13/2015 she underwent bilateral diagnostic mammography with tomosynthesis as well as bilateral breast ultrasound. The breast density was category B. In the right  breast at the 11:00 position there was a 1.3 cm irregular mass which by ultrasonography measured 1.3 cm.--In the left breast there was a 4 cm irregular mass at the 2:00 position associated with nipple retraction. There was a second, 1.7 cm area of architectural distortion at the 9:00 position. Both were palpable. By ultrasonography, the 4 cm irregular mass was noted, with a second mass measuring 2.5 cm by ultrasonography. Both axillae were benign.  On 04/19/2015 the patient underwent right breast upper outer quadrant biopsy, showing ([id number]) and invasive ductal carcinoma, grade 1, estrogen receptor 90% positive, progesterone receptor negative, with an MIB-1 of 5%, and no HER-2 amplification, the signals ratio being 1.26 and the number per cell 2.20.  On the same day, she underwent biopsy of the 2 left breast masses in question as well as a suspicious left axillary lymph node. All 3 biopsies showed invasive ductal carcinoma, grade 2, estrogen receptor 80-100% positive, progesterone receptor 80-100% positive, with the MIB-1 between 5 and 10%, and no HER-2 amplification, the signals ratio being between 1.05 and 1.13, and the number per cell between 2.10 and 2.25.  The patient's subsequent history is as detailed below  INTERVAL HISTORY: Margaret [name] returns today for follow-up and treatment of her estrogen receptor positive breast cancer. She is doing well today.  She did have her knee aspirated on 8/24 followed by an injection with her PCP.  She tolerated that moderately well.  Her knee is much better and she has been able to go back to work.  She has some mild diarrhea, however she also has recently been started on Metformin.  She denies any other issues today and is feeling well.  REVIEW OF SYSTEMS:  Margaret [name] denies any new pain.  She does have some mild aches from the Anastrozole, but this sounds like it is much improved since her knee injection.  She denies any other issues and a detailed ROS is  otherwise non contributory.    PAST MEDICAL HISTORY: Past Medical History:  Diagnosis Date  . Bilateral breast cancer ([place])   . Breast cancer ([place])   . Breast cancer of upper-outer quadrant of left female breast ([place]) 04/21/2015  . GERD (gastroesophageal reflux disease)   . Hypertension     PAST SURGICAL HISTORY: Past Surgical History:  Procedure Laterality Date  . ABDOMINAL HYSTERECTOMY  1990  . MASTECTOMY MODIFIED RADICAL Left 05/09/2015  . MASTECTOMY MODIFIED RADICAL Left 05/09/2015   Procedure: LEFT MODIFIED RADICAL MASTETCTOMY;  Surgeon: Autumn Messing III, MD;  Location: [facility];  Service: General;  Laterality: Left;  Marland Kitchen MASTECTOMY W/ SENTINEL NODE BIOPSY Right   . MASTECTOMY W/ SENTINEL NODE BIOPSY Right 05/09/2015   Procedure: RIGHT MASTECTOMY WITH RIGHT AXILLARY SENTINEL LYMPH NODE BIOPSY;  Surgeon: Autumn Messing III, MD;  Location: [facility];  Service: General;  Laterality: Right;  . PORTACATH PLACEMENT Right 09/01/2015   Procedure: INSERTION PORT-A-CATH;  Surgeon: Autumn Messing III, MD;  Location: [facility];  Service: General;  Laterality: Right;  . TUBAL LIGATION  1984    FAMILY HISTORY Family History  Problem Relation Age of Onset  . Hypertension Mother   . Diabetes Mother     the patient has little information on her father. Her mother is living, currently age 31. She has one brother, 2 sisters. There is no history of breast or ovarian cancer in the family to her knowledge.   GYNECOLOGIC HISTORY:  No LMP recorded. Patient has had a hysterectomy.  menarche age 24, first live birth age 62. She is GX P4. She underwent a total abdominal hysterectomy with bilateral salpingo-oophorectomy at age 33. She did not take hormone replacement. She never used oral contraceptives.   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 She is single and lives alone  The patient's daughter   [name] works as a Programmer, applications, as does her daughter  [name]. Daughter [name] works at [employer], as a call. All 3 live in [place]. The patient's son Marc Morgans Tinch's lives in [place]. He prepares sets for shows The patient has 4 grandchildren, no great grandchildren. She attends a [employer] in [place].    ADVANCED DIRECTIVES: Not in place. At the initial clinic visit the patient was given the appropriate forms to complete and notarize at her discretion. The patient intends to name her daughter Sampson Si as healthcare power of attorney. She can be reached at [phone].  HEALTH MAINTENANCE: Social History  Substance Use Topics  . Smoking status: Former Smoker    Packs/day: 0.10    Years: 28.00  . Smokeless tobacco: Never Used  . Alcohol use No     Colonoscopy: Never  PAP: Status post hysterectomy  Bone density: Never  Lipid panel:  Allergies  Allergen Reactions  . Banana Hives    Tongue itching  . Latex Itching and Other (See Comments)    burning  . 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 Wheat Bran Hives    Current Outpatient Prescriptions  Medication Sig Dispense  Refill  . ALPRAZolam (XANAX) 1 MG tablet Take 1 tablet (1 mg total) by mouth 2 (two) times daily as needed for anxiety. 60 tablet 1  . amLODipine (NORVASC) 5 MG tablet Take 1 tablet (5 mg total) by mouth daily. 30 tablet 6  . anastrozole (ARIMIDEX) 1 MG tablet Take 1 tablet (1 mg total) by mouth daily. 90 tablet 4  . diphenhydrAMINE (BENADRYL) 25 mg capsule Take 1 capsule (25 mg total) by mouth every 6 (six) hours as needed. 100 capsule 3  . gabapentin (NEURONTIN) 300 MG capsule Take 2 capsules (600 mg total) by mouth 3 (three) times daily. 180 capsule 1  . ibuprofen (ADVIL,MOTRIN) 800 MG tablet Take 1 tablet (800 mg total) by mouth every 8 (eight) hours as needed. 90 tablet 3  . Investigational palbociclib (IBRANCE) 125 MG capsule  [facility] AFT-05 PALLAS Take 1 capsule (125 mg total) by mouth daily. Take with food. Swallow whole. Do not chew. Take on days 1-21. Repeat every 28 days. 63 capsule 0  . losartan-hydrochlorothiazide (HYZAAR) 100-25 MG tablet Take 1 tablet by mouth daily. 90 tablet 3  . metFORMIN (GLUCOPHAGE) 500 MG tablet Take 1 tablet (500 mg total) by mouth 2 (two) times daily with a meal. 60 tablet 2  . pantoprazole (PROTONIX) 40 MG tablet Take 1 tablet (40 mg total) by mouth daily. 30 tablet 3  . potassium chloride (MICRO-K) 10 MEQ CR capsule Take 1 capsule (10 mEq total) by mouth 2 (two) times daily. 90 capsule 4  . traMADol (ULTRAM) 50 MG tablet Take 1 tablet (50 mg total) by mouth every 8 (eight) hours as needed. 60 tablet 0  . traZODone (DESYREL) 50 MG tablet TAKE 1 TABLET BY MOUTH AT BEDTIME. MAY INCREASE TO 2 TABLETS AFTER A FEW DAYS IF NEEDED 60 tablet 1  . valACYclovir (VALTREX) 1000 MG tablet Take 1 tablet (1,000 mg total) by mouth 2 (two) times daily. 90 tablet 4  . venlafaxine XR (EFFEXOR-XR) 150 MG 24 hr capsule Take 1 capsule (150 mg total) by mouth daily with breakfast. 90 capsule 3   No current facility-administered medications for this visit.       OBJECTIVE:   Vitals:   05/06/17 1146  BP: (!) 165/97  Pulse: 99  Resp: 18  Temp: 98.3 F (36.8 C)  SpO2: 99%     Body mass index is 42.18 kg/m.    ECOG FS:1 - Symptomatic but completely ambulatory  GENERAL: Patient is a well appearing female in no acute distress HEENT:  Sclerae anicteric.  PERRL. Oropharynx clear and moist. No ulcerations or evidence of oropharyngeal candidiasis. Neck is supple.  NODES:  No cervical, supraclavicular, or axillary lymphadenopathy palpated.  BREAST EXAM: s/p bilateral mastectomies, no nodularity or sign of recurrence noted, she does have some thickening in her left axilla likely seroma, she has not yet followed up with Dr. Marlou Starks about this. LUNGS:  Clear to auscultation bilaterally.  No wheezes or  rhonchi. HEART:  Regular rate and rhythm. No murmur appreciated. ABDOMEN:  Soft, nontender.  Positive, normoactive bowel sounds. No organomegaly palpated. MSK:  No focal spinal tenderness to palpation. Full range of motion bilaterally in the upper extremities. EXTREMITIES:  No peripheral edema.   SKIN:  Clear with no obvious rashes or skin changes. No nail dyscrasia. NEURO:  Nonfocal. Well oriented.  Appropriate affect.       LAB RESULTS:  CMP     Component Value Date/Time   NA 142 05/06/2017 1126  K 3.1 (L) 05/06/2017 1126   CL 102 06/29/2015 0923   CO2 29 05/06/2017 1126   GLUCOSE 135 05/06/2017 1126   BUN 12.0 05/06/2017 1126   CREATININE 0.9 05/06/2017 1126   CALCIUM 10.1 05/06/2017 1126   PROT 8.6 (H) 05/06/2017 1126   ALBUMIN 4.0 05/06/2017 1126   AST 28 05/06/2017 1126   ALT 34 05/06/2017 1126   ALKPHOS 88 05/06/2017 1126   BILITOT 0.28 05/06/2017 1126   GFRNONAA >60 06/29/2015 0923   GFRAA >60 06/29/2015 0923    INo results found for: SPEP, UPEP  Lab Results  Component Value Date   WBC 7.0 05/06/2017   NEUTROABS 4.5 05/06/2017   HGB 14.6 05/06/2017   HCT 43.6 05/06/2017   MCV 103.0 (H) 05/06/2017   PLT 198 05/06/2017      Chemistry      Component Value Date/Time   NA 142 05/06/2017 1126   K 3.1 (L) 05/06/2017 1126   CL 102 06/29/2015 0923   CO2 29 05/06/2017 1126   BUN 12.0 05/06/2017 1126   CREATININE 0.9 05/06/2017 1126   GLU 157 (H) 08/17/2016 0818      Component Value Date/Time   CALCIUM 10.1 05/06/2017 1126   ALKPHOS 88 05/06/2017 1126   AST 28 05/06/2017 1126   ALT 34 05/06/2017 1126   BILITOT 0.28 05/06/2017 1126       No results found for: LABCA2  No components found for: LABCA125  No results for input(s): INR in the last 168 hours.  Urinalysis    Component Value Date/Time   COLORURINE YELLOW 06/29/2015 1030   APPEARANCEUR CLOUDY (A) 06/29/2015 1030   LABSPEC 1.019 06/29/2015 1030   PHURINE 7.0 06/29/2015 1030    GLUCOSEU NEGATIVE 06/29/2015 1030   HGBUR TRACE (A) 06/29/2015 1030   BILIRUBINUR NEGATIVE 06/29/2015 1030   KETONESUR NEGATIVE 06/29/2015 1030   PROTEINUR NEGATIVE 06/29/2015 1030   NITRITE NEGATIVE 06/29/2015 1030   LEUKOCYTESUR MODERATE (A) 06/29/2015 1030    STUDIES:  No results found.  ASSESSMENT: 56 y.o. [place] woman with synchronous breast cancers, as follows  (a) status post right breast upper outer quadrant biopsy 04/19/2015 for a clinical pT1c N0, stage IA invasive ductal carcinoma, grade 1, estrogen receptor positive, progesterone receptor negative, with an MIB-1 of 5%, and no HER-2 amplification  (b) status post left breast upper outer quadrant biopsy 2 and left axillary lymph node biopsy 04/19/2015 for a clinical T3 N1, stage IIIA invasive ductal carcinoma, grade 1 or 2, estrogen receptor and progesterone receptor positive, HER-2 negative, with an MIB-1 between 5 and 10%  (1) status post bilateral mastectomies 05/09/2015, showing:   (a) on the right side, a pT2 pN0, stage IIA invasive ductal carcinoma, with negative margins and repeat HER-2 again negative    (b) on the left, a pT3 pN2, stage IIIA invasive ductal carcinoma, grade 2, with negative margins, and repeat HER-2 again negative  (2) Mammaprint from the left-sided tumor returned "luminal type, low risk", predicting a small benefit from chemotherapy with the important caveat that N2 disease was not included in the MINDACT study--patient opted for chemotherapy  (3) Oncotype from the right-sided tumor showed a score of 12, predicting an 8% risk of recurrence outside the breast within the next 10 years, if the patient's only systemic therapy was tamoxifen for 5 years. It also predicted no significant benefit from chemotherapy  (4) postmastectomy radiation to the left chest wall completed 08/15/2015  (5) adjuvant chemotherapy consisting of cyclophosphamide and docetaxel  x4 completed on 11/21/15.  (5) anastrozole  started 02/08/2016  (6) left upper extremity lymphedema  (7) signed for B-WELL study 02/08/2016  (8) participating in Lupus trial as of August 2017  (a) randomized to palbociclib, starting October 2017  PLAN: Margaret [name] is doing well today.  She denies any issues today.  I reviewed her lab work with her today.  Her blood counts are normal.  She will continue palbociclib and Anastrozole.  Her potassium was 3.1 today.  When we reviewed her medications she was not taking the potassium BID as prescribed.  She will correct this.   She has not followed up with Dr. Marlou Starks in some time, and I recommended f/u with him.  She will return in December for labs and f/u with Dr. Jana Hakim.  She is ok with doing this on Christmas Eve.    She knows to call for any problems that may develop before that visit.  A total of (30) minutes of face-to-face time was spent with this patient with greater than 50% of that time in counseling and care-coordination.   Scot Dock, NP   05/06/2017 12:02 PM

## 2017-05-06 NOTE — Telephone Encounter (Signed)
Scheduled appt per 10/1 los - sent reminder letter in  The mail. Lab and f/u 12/24

## 2017-05-07 ENCOUNTER — Encounter: Payer: Self-pay | Admitting: Adult Health

## 2017-05-07 ENCOUNTER — Telehealth: Payer: Self-pay

## 2017-05-07 NOTE — Telephone Encounter (Signed)
Left voice mail for patient that appt has been made with Dr. Marlou Starks this Friday 05/10/17 at 9:50 am for follow up.  Asked patient that she please call back in receipt of message.

## 2017-05-30 NOTE — Progress Notes (Signed)
05/30/2017 Additional out of range chemistry and hematology lab results were not clinically significant. Cindy S. Brigitte Pulse BSN, RN, CCRP 05/30/2017 1:13 PM

## 2017-06-07 ENCOUNTER — Other Ambulatory Visit: Payer: Self-pay | Admitting: Adult Health

## 2017-06-07 ENCOUNTER — Other Ambulatory Visit: Payer: Self-pay | Admitting: Oncology

## 2017-06-07 DIAGNOSIS — C50211 Malignant neoplasm of upper-inner quadrant of right female breast: Secondary | ICD-10-CM

## 2017-06-07 DIAGNOSIS — C50412 Malignant neoplasm of upper-outer quadrant of left female breast: Secondary | ICD-10-CM

## 2017-06-07 DIAGNOSIS — Z170 Estrogen receptor positive status [ER+]: Principal | ICD-10-CM

## 2017-06-07 DIAGNOSIS — G4720 Circadian rhythm sleep disorder, unspecified type: Secondary | ICD-10-CM

## 2017-06-07 MED FILL — IBUPROFEN 800 MG TABS: 800 | 30 days supply | Qty: 90 | Fill #3

## 2017-06-07 MED FILL — AMLODIPINE BESYLATE 5 MG TA: 5 | 30 days supply | Qty: 30 | Fill #4

## 2017-06-10 ENCOUNTER — Telehealth: Payer: Self-pay

## 2017-06-10 MED FILL — traZODone HCL 50 MG TABS: 50 | 30 days supply | Qty: 60 | Fill #0

## 2017-06-10 NOTE — Telephone Encounter (Signed)
FYI, patient continues to miss f/u with you

## 2017-06-10 NOTE — Telephone Encounter (Signed)
LPN spoke with Margaret [name] today about missed appt with Dr. Marlou Starks for f/u visit.  She states that she was in orientation with a new job that week and could not go.  Margaret [name] says she intends on going and is planning to make an appt this week.

## 2017-06-22 MED FILL — metFORMIN HCL 500 MG TABS: 500 | 30 days supply | Qty: 60 | Fill #2

## 2017-06-22 MED FILL — ANASTROZOLE 1 MG TABLET: 1 | 90 days supply | Qty: 90 | Fill #1

## 2017-06-24 MED FILL — ALPRAZolam 1 MG TABS: 1 | 30 days supply | Qty: 60 | Fill #0

## 2017-06-26 ENCOUNTER — Other Ambulatory Visit: Payer: Self-pay | Admitting: *Deleted

## 2017-06-28 MED FILL — VENLAFAXINE HCL ER 150 MG C: 150 | 90 days supply | Qty: 90 | Fill #1

## 2017-06-28 MED FILL — traMADol HCL 50 MG TABS: 50 | 20 days supply | Qty: 60 | Fill #0

## 2017-07-02 ENCOUNTER — Other Ambulatory Visit: Payer: Self-pay

## 2017-07-04 ENCOUNTER — Ambulatory Visit: Payer: Self-pay | Admitting: Internal Medicine

## 2017-07-04 ENCOUNTER — Encounter: Payer: Self-pay | Admitting: Internal Medicine

## 2017-07-04 ENCOUNTER — Ambulatory Visit (INDEPENDENT_AMBULATORY_CARE_PROVIDER_SITE_OTHER): Payer: Self-pay | Admitting: Internal Medicine

## 2017-07-04 NOTE — Progress Notes (Signed)
Error

## 2017-07-06 ENCOUNTER — Telehealth: Payer: Self-pay | Admitting: Oncology

## 2017-07-06 NOTE — Telephone Encounter (Signed)
Scheduled appt per 11/30 sch msg. Left voicemail for patient regarding appts. Sending confirmation letter in the mail.

## 2017-07-09 ENCOUNTER — Other Ambulatory Visit: Payer: Self-pay

## 2017-07-09 DIAGNOSIS — Z170 Estrogen receptor positive status [ER+]: Principal | ICD-10-CM

## 2017-07-09 DIAGNOSIS — C50211 Malignant neoplasm of upper-inner quadrant of right female breast: Secondary | ICD-10-CM

## 2017-07-09 DIAGNOSIS — C50412 Malignant neoplasm of upper-outer quadrant of left female breast: Secondary | ICD-10-CM

## 2017-07-09 MED ORDER — TRAMADOL HCL 50 MG PO TABS: 50.0000 mg | ORAL_TABLET | Freq: Three times a day (TID) | ORAL | 0 refills | Status: DC | PRN

## 2017-07-22 ENCOUNTER — Telehealth: Payer: Self-pay

## 2017-07-22 ENCOUNTER — Ambulatory Visit: Payer: Self-pay | Admitting: Family Medicine

## 2017-07-22 NOTE — Telephone Encounter (Signed)
Called and left a message with the below information:  Please give her a reminder call on Monday or Tuesday and ask her to bring the following: a. Palbociclib pill bottles (should have 3) b. Diaries for Cycles 15-17 - three each for palbociclib and anti-hormone

## 2017-07-23 NOTE — Progress Notes (Signed)
Margaret [name]  Telephone:([phone] Fax:([phone]) [phone]   ID: LAKASHA MCFALL DOB: 1960-10-19  MR#: [id number]  TTS#:[id number]  Patient Care Team: Hoyt Koch, MD as PCP - General (Internal Medicine) Jovita Kussmaul, MD as Consulting Physician (General Surgery) Bralyn Folkert, Virgie Dad, MD as Consulting Physician (Oncology) Gery Pray, MD as Consulting Physician (Radiation Oncology) Mauro Kaufmann, RN as Registered Nurse Rockwell Germany, RN as Registered Nurse Benson Norway, RN as Registered Nurse Rosemarie Ax, MD as Consulting Physician (Family Medicine) PCP: Hoyt Koch, MD GYN: OTHER MD:  CHIEF COMPLAINT:  estrogen receptor positive breast cancer; bilateral   CURRENT TREATMENT: anastrozole, palbociclib  BREAST CANCER HISTORY: From the original intake note:  Gerard's primary care physician retired sometime ago and her health maintenance has not been up-to-date. She has been receiving medical care through the emergency room and was seen in March 2015 following a head injury, then in August 2015 for lancing of an abscess. In December 2015 she noticed that her left breast looked a little bit smaller than her right. She brought this to the attention of friends and family over the next several months but the general feeling was that most people are not perfectly symmetrical. By the summer of this year she noticed that her nipple on the left was "going in". More recently she started developing pain in the left breast. She was evaluated for this in the emergency room 04/09/2015 at which time a left breast exam showed a deformed left breast with a large hard mass encompassing most of the breast, with nipple retraction. There was no nipple discharge or bleeding and no palpable adenopathy.  The patient was referred to [facility] and on 04/13/2015 she underwent bilateral diagnostic mammography with tomosynthesis as well as bilateral breast ultrasound. The breast  density was category B. In the right breast at the 11:00 position there was a 1.3 cm irregular mass which by ultrasonography measured 1.3 cm.--In the left breast there was a 4 cm irregular mass at the 2:00 position associated with nipple retraction. There was a second, 1.7 cm area of architectural distortion at the 9:00 position. Both were palpable. By ultrasonography, the 4 cm irregular mass was noted, with a second mass measuring 2.5 cm by ultrasonography. Both axillae were benign.  On 04/19/2015 the patient underwent right breast upper outer quadrant biopsy, showing ([id number]) and invasive ductal carcinoma, grade 1, estrogen receptor 90% positive, progesterone receptor negative, with an MIB-1 of 5%, and no HER-2 amplification, the signals ratio being 1.26 and the number per cell 2.20.  On the same day, she underwent biopsy of the 2 left breast masses in question as well as a suspicious left axillary lymph node. All 3 biopsies showed invasive ductal carcinoma, grade 2, estrogen receptor 80-100% positive, progesterone receptor 80-100% positive, with the MIB-1 between 5 and 10%, and no HER-2 amplification, the signals ratio being between 1.05 and 1.13, and the number per cell between 2.10 and 2.25.  The patient's subsequent history is as detailed below  INTERVAL HISTORY: Margaret [name] returns today for follow-up and treatment of her estrogen receptor positive breast cancer.  She continues on anastrozole, which she tolerates relatively well. She does experience hot flashes and vaginal dryness. Lylith uses coconut oil as advised with relief.   She is also on palbociclib, currently at 125 mg daily, 21 days on and 7 days off. She notices a mild difference in herself on the days she does take the medication verses the  days she does not.   She is requesting a flu shot today.  REVIEW OF SYSTEMS:  Margaret [name] went back to work even though she sates she shouldn't have. 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 She works around 2pm to [employer]. She has been doing her best to exercise as much as she can. The patient was experiencing discomfort in her bilateral legs and it was discovered that she had a lot of fluid built up. She is being followed by Dr. Raeford Razor. She denies unusual headaches, visual changes, nausea, vomiting, or dizziness. There has been no unusual cough, phlegm production, or pleurisy. This been no change in bowel or bladder habits. She denies unexplained fatigue or unexplained weight loss, bleeding, rash, or fever. A detailed review of systems was otherwise entirely stable.   PAST MEDICAL HISTORY: Past Medical History:  Diagnosis Date  . Bilateral breast cancer ([place])   . Breast cancer ([place])   . Breast cancer of upper-outer quadrant of left female breast ([place]) 04/21/2015  . GERD (gastroesophageal reflux disease)   . Hypertension     PAST SURGICAL HISTORY: Past Surgical History:  Procedure Laterality Date  . ABDOMINAL HYSTERECTOMY  1990  . MASTECTOMY MODIFIED RADICAL Left 05/09/2015  . MASTECTOMY MODIFIED RADICAL Left 05/09/2015   Procedure: LEFT MODIFIED RADICAL MASTETCTOMY;  Surgeon: Autumn Messing III, MD;  Location: [facility];  Service: General;  Laterality: Left;  Marland Kitchen MASTECTOMY W/ SENTINEL NODE BIOPSY Right   . MASTECTOMY W/ SENTINEL NODE BIOPSY Right 05/09/2015   Procedure: RIGHT MASTECTOMY WITH RIGHT AXILLARY SENTINEL LYMPH NODE BIOPSY;  Surgeon: Autumn Messing III, MD;  Location: [facility];  Service: General;  Laterality: Right;  . PORTACATH PLACEMENT Right 09/01/2015   Procedure: INSERTION PORT-A-CATH;  Surgeon: Autumn Messing III, MD;  Location: [facility];  Service: General;  Laterality: Right;  . TUBAL LIGATION  1984    FAMILY HISTORY Family History  Problem Relation Age of Onset  . Hypertension Mother   . Diabetes Mother     the patient has little information on her father. Her mother is living, currently age 56. She has one  brother, 2 sisters. There is no history of breast or ovarian cancer in the family to her knowledge.   GYNECOLOGIC HISTORY:  No LMP recorded. Patient has had a hysterectomy.  menarche age 62, first live birth age 17. She is GX P4. She underwent a total abdominal hysterectomy with bilateral salpingo-oophorectomy at age 25. She did not take hormone replacement. She never used oral contraceptives.   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 She previously worked the same position at [employer] for 25+ years. She is single and lives alone  The patient's daughter  [name] works as a Programmer, applications, as does her daughter  [name]. Daughter [name] works at [employer], as a call. All 3 live in [place]. The patient's son Marc Morgans Rody's lives in [place]. He prepares sets for shows The patient has 4 grandchildren, no great grandchildren. She attends a [employer] in [place].    ADVANCED DIRECTIVES: Not in place. At the initial clinic visit the patient was given the appropriate forms to complete and notarize at her discretion. The patient intends to name her daughter Sampson Si as healthcare power of attorney. She can be reached at [phone].  HEALTH MAINTENANCE: Social History   Tobacco  Use  . Smoking status: Former Smoker    Packs/day: 0.10    Years: 28.00    Pack years: 2.80  . Smokeless tobacco: Never Used  Substance Use Topics  . Alcohol use: No    Alcohol/week: 0.0 oz  . Drug use: No     Colonoscopy: Never  PAP: Status post hysterectomy  Bone density: Never  Lipid panel:  Allergies  Allergen Reactions  . Banana Hives    Tongue itching  . Latex Itching and Other (See Comments)    burning  . 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 Wheat Bran Hives    Current Outpatient Medications  Medication Sig Dispense Refill  . ALPRAZolam (XANAX) 1 MG tablet Take 1 tablet (1 mg  total) by mouth 2 (two) times daily as needed for anxiety. 60 tablet 1  . amLODipine (NORVASC) 5 MG tablet Take 1 tablet (5 mg total) by mouth daily. 90 tablet 6  . anastrozole (ARIMIDEX) 1 MG tablet Take 1 tablet (1 mg total) by mouth daily. 90 tablet 4  . calcium-vitamin D (OSCAL WITH D) 500-200 MG-UNIT tablet Take 1 tablet by mouth 2 (two) times daily. 60 tablet 0  . diphenhydrAMINE (BENADRYL) 25 mg capsule Take 1 capsule (25 mg total) by mouth every 6 (six) hours as needed. 100 capsule 3  . gabapentin (NEURONTIN) 300 MG capsule Take 2 capsules (600 mg total) by mouth 3 (three) times daily. 180 capsule 1  . ibuprofen (ADVIL,MOTRIN) 800 MG tablet Take 1 tablet (800 mg total) by mouth every 8 (eight) hours as needed. 90 tablet 3  . Investigational palbociclib (IBRANCE) 125 MG capsule [facility] AFT-05 PALLAS Take 1 capsule (125 mg total) by mouth daily. Take with food. Swallow whole. Do not chew. Take on days 1-21. Repeat every 28 days. 63 capsule 0  . losartan-hydrochlorothiazide (HYZAAR) 100-25 MG tablet Take 1 tablet by mouth daily. 90 tablet 3  . metFORMIN (GLUCOPHAGE) 500 MG tablet Take 1 tablet (500 mg total) by mouth 2 (two) times daily with a meal. 60 tablet 2  . pantoprazole (PROTONIX) 40 MG tablet Take 1 tablet (40 mg total) by mouth daily. 30 tablet 3  . potassium chloride (MICRO-K) 10 MEQ CR capsule Take 1 capsule (10 mEq total) by mouth 2 (two) times daily. 90 capsule 4  . traMADol (ULTRAM) 50 MG tablet Take 1 tablet (50 mg total) by mouth every 8 (eight) hours as needed. 60 tablet 0  . traZODone (DESYREL) 50 MG tablet TAKE 1 TABLET BY MOUTH AT BEDTIME. MAY INCREASE TO 2 TABLETS AFTER A FEW DAYS IF NEEDED 60 tablet 1  . valACYclovir (VALTREX) 1000 MG tablet Take 1 tablet (1,000 mg total) by mouth 2 (two) times daily. 90 tablet 4  . venlafaxine XR (EFFEXOR-XR) 150 MG 24 hr capsule Take 1 capsule (150 mg total) by mouth daily with breakfast. 90 capsule 3   No current  facility-administered medications for this visit.       OBJECTIVE: Middle-aged African-American woman who appears stated age  56:   07/24/17 1420  BP: (!) 172/109  Pulse: (!) 104  Resp: 17  Temp: 98.4 F (36.9 C)  SpO2: 99%     Body mass index is 40.15 kg/m.    ECOG FS:1 - Symptomatic but completely ambulatory  Sclerae unicteric, pupils round and equal Oropharynx clear and moist No cervical or supraclavicular adenopathy Lungs no rales or rhonchi Heart regular rate and rhythm  Abd soft, obese, nontender, positive bowel sounds MSK no focal spinal tenderness, no upper extremity lymphedema Neuro: nonfocal, well oriented, appropriate affect Breasts: Status post bilateral mastectomies.  I do not palpate any findings suspicious for chest wall recurrence.  Both axillae are benign.  LAB RESULTS:  CMP     Component Value Date/Time   NA 142 07/24/2017 1407   K 3.3 (L) 07/24/2017 1407   CL 102 06/29/2015 0923   CO2 26 07/24/2017 1407   GLUCOSE 110 07/24/2017 1407   BUN 13.4 07/24/2017 1407   CREATININE 1.0 07/24/2017 1407   CALCIUM 9.6 07/24/2017 1407   PROT 8.5 (H) 07/24/2017 1407   ALBUMIN 3.9 07/24/2017 1407   AST 23 07/24/2017 1407   ALT 28 07/24/2017 1407   ALKPHOS 84 07/24/2017 1407   BILITOT 0.30 07/24/2017 1407   GFRNONAA >60 06/29/2015 0923   GFRAA >60 06/29/2015 0923    INo results found for: SPEP, UPEP  Lab Results  Component Value Date   WBC 6.6 07/24/2017   NEUTROABS 4.6 07/24/2017   HGB 13.9 07/24/2017   HCT 41.6 07/24/2017   MCV 105.1 (H) 07/24/2017   PLT 211 07/24/2017      Chemistry      Component Value Date/Time   NA 142 07/24/2017 1407   K 3.3 (L) 07/24/2017 1407   CL 102 06/29/2015 0923   CO2 26 07/24/2017 1407   BUN 13.4 07/24/2017 1407   CREATININE 1.0 07/24/2017 1407   GLU 157 (H) 08/17/2016 0818      Component Value Date/Time   CALCIUM 9.6 07/24/2017 1407   ALKPHOS 84 07/24/2017 1407   AST 23 07/24/2017 1407   ALT 28  07/24/2017 1407   BILITOT 0.30 07/24/2017 1407       No results found for: LABCA2  No components found for: LABCA125  No results for input(s): INR in the last 168 hours.  Urinalysis    Component Value Date/Time   COLORURINE YELLOW 06/29/2015 1030   APPEARANCEUR CLOUDY (A) 06/29/2015 1030   LABSPEC 1.019 06/29/2015 1030   PHURINE 7.0 06/29/2015 1030   GLUCOSEU NEGATIVE 06/29/2015 1030   HGBUR TRACE (A) 06/29/2015 1030   BILIRUBINUR NEGATIVE 06/29/2015 1030   KETONESUR NEGATIVE 06/29/2015 1030   PROTEINUR NEGATIVE 06/29/2015 1030   NITRITE NEGATIVE 06/29/2015 1030   LEUKOCYTESUR MODERATE (A) 06/29/2015 1030    STUDIES: No results found.   ASSESSMENT: 56 y.o. [place] woman with synchronous breast cancers, as follows  (a) status post right breast upper outer quadrant biopsy 04/19/2015 for a clinical pT1c N0, stage IA invasive ductal carcinoma, grade 1, estrogen receptor positive, progesterone receptor negative, with an MIB-1 of 5%, and no HER-2 amplification  (b) status post left breast upper outer quadrant biopsy 2 and left axillary lymph node biopsy 04/19/2015 for a clinical T3 N1, stage IIIA invasive ductal carcinoma, grade 1 or 2, estrogen receptor and progesterone receptor positive, HER-2 negative, with an MIB-1 between 5 and 10%  (1) status post bilateral mastectomies 05/09/2015, showing:   (a) on the right side, a pT2 pN0, stage IIA invasive ductal carcinoma, with negative margins and repeat HER-2 again negative    (b) on the left, a pT3 pN2, stage IIIA invasive ductal carcinoma, grade 2, with negative margins, and repeat HER-2 again negative  (2) Mammaprint from the left-sided tumor returned "luminal type, low risk", predicting a small benefit from chemotherapy with the important caveat that N2 disease was not included in the MINDACT study--patient opted for chemotherapy  (  3) Oncotype from the right-sided tumor showed a score of 12, predicting an 8% risk of  recurrence outside the breast within the next 10 years, if the patient's only systemic therapy was tamoxifen for 5 years. It also predicted no significant benefit from chemotherapy  (4) postmastectomy radiation to the left chest wall completed 08/15/2015  (5) adjuvant chemotherapy consisting of cyclophosphamide and docetaxel x4 completed on 11/21/15.  (5) anastrozole started 02/08/2016  (6) left upper extremity lymphedema  (7) signed for B-WELL study 02/08/2016  (8) participating in [place] trial as of August 2017  (a) randomized to palbociclib, starting October 2017  PLAN: Margaret [name] is now a little over 2 years out from definitive surgery for her breast cancer with no evidence of disease recurrence.  This is favorable.  She continues on anastrozole and palbociclib, with good tolerance.  The plan is to continue that until there is evidence of disease progression or until she reaches 5 years of follow-up.  She is trying her best at her new job.  Hopefully she will be able to succeed there.  She is still waiting on disability however.  Today we gave her a flu shot and we refilled the medications that had lapsed.  Unfortunately she still does not have insurance so it is difficult for her to get all her medications refilled on time  She will see [place] again in March as per Chauncey Cruel, MD  07/24/17 2:48 PM Medical Oncology and Hematology [facility] [place], [place] [address] Tel. [phone]    Fax. [phone]  This document serves as a record of services personally performed by Chauncey Cruel, MD. It was created on his behalf by Margit Banda, a trained medical scribe. The creation of this record is based on the scribe's personal observations and the provider's statements to them.

## 2017-07-24 ENCOUNTER — Encounter: Payer: Self-pay | Admitting: *Deleted

## 2017-07-24 ENCOUNTER — Other Ambulatory Visit (HOSPITAL_BASED_OUTPATIENT_CLINIC_OR_DEPARTMENT_OTHER): Payer: Self-pay

## 2017-07-24 ENCOUNTER — Ambulatory Visit (HOSPITAL_BASED_OUTPATIENT_CLINIC_OR_DEPARTMENT_OTHER): Payer: Self-pay | Admitting: Oncology

## 2017-07-24 VITALS — BP 172/109 | HR 104 | Temp 98.4°F | Resp 17 | Ht 69.0 in | Wt 271.9 lb

## 2017-07-24 VITALS — Wt 271.3 lb

## 2017-07-24 DIAGNOSIS — G629 Polyneuropathy, unspecified: Secondary | ICD-10-CM

## 2017-07-24 DIAGNOSIS — C50211 Malignant neoplasm of upper-inner quadrant of right female breast: Secondary | ICD-10-CM

## 2017-07-24 DIAGNOSIS — Z006 Encounter for examination for normal comparison and control in clinical research program: Secondary | ICD-10-CM

## 2017-07-24 DIAGNOSIS — G4720 Circadian rhythm sleep disorder, unspecified type: Secondary | ICD-10-CM

## 2017-07-24 DIAGNOSIS — Z170 Estrogen receptor positive status [ER+]: Secondary | ICD-10-CM

## 2017-07-24 DIAGNOSIS — Z79811 Long term (current) use of aromatase inhibitors: Secondary | ICD-10-CM

## 2017-07-24 DIAGNOSIS — C50412 Malignant neoplasm of upper-outer quadrant of left female breast: Secondary | ICD-10-CM

## 2017-07-24 DIAGNOSIS — C50011 Malignant neoplasm of nipple and areola, right female breast: Secondary | ICD-10-CM

## 2017-07-24 DIAGNOSIS — C50012 Malignant neoplasm of nipple and areola, left female breast: Secondary | ICD-10-CM

## 2017-07-24 LAB — COMPREHENSIVE METABOLIC PANEL WITH GFR
ALT: 28 U/L (ref 0–55)
AST: 23 U/L (ref 5–34)
Albumin: 3.9 g/dL (ref 3.5–5.0)
Alkaline Phosphatase: 84 U/L (ref 40–150)
Anion Gap: 11 meq/L (ref 3–11)
BUN: 13.4 mg/dL (ref 7.0–26.0)
CO2: 26 meq/L (ref 22–29)
Calcium: 9.6 mg/dL (ref 8.4–10.4)
Chloride: 105 meq/L (ref 98–109)
Creatinine: 1 mg/dL (ref 0.6–1.1)
EGFR: >60 ml/min/1.73 m2
Glucose: 110 mg/dL (ref 70–140)
Potassium: 3.3 meq/L — ABNORMAL LOW (ref 3.5–5.1)
Sodium: 142 meq/L (ref 136–145)
Total Bilirubin: 0.3 mg/dL (ref 0.20–1.20)
Total Protein: 8.5 g/dL — ABNORMAL HIGH (ref 6.4–8.3)

## 2017-07-24 LAB — CBC WITH DIFFERENTIAL/PLATELET
BASO%: 1.6 % (ref 0.0–2.0)
Basophils Absolute: 0.1 10*3/uL (ref 0.0–0.1)
EOS%: 0.9 % (ref 0.0–7.0)
Eosinophils Absolute: 0.1 10*3/uL (ref 0.0–0.5)
HCT: 41.6 % (ref 34.8–46.6)
HGB: 13.9 g/dL (ref 11.6–15.9)
LYMPH%: 21.4 % (ref 14.0–49.7)
MCH: 35.2 pg — ABNORMAL HIGH (ref 25.1–34.0)
MCHC: 33.5 g/dL (ref 31.5–36.0)
MCV: 105.1 fL — ABNORMAL HIGH (ref 79.5–101.0)
MONO#: 0.4 10*3/uL (ref 0.1–0.9)
MONO%: 5.8 % (ref 0.0–14.0)
NEUT#: 4.6 10*3/uL (ref 1.5–6.5)
NEUT%: 70.3 % (ref 38.4–76.8)
Platelets: 211 10*3/uL (ref 145–400)
RBC: 3.96 10*6/uL (ref 3.70–5.45)
RDW: 15.4 % — ABNORMAL HIGH (ref 11.2–14.5)
WBC: 6.6 10*3/uL (ref 3.9–10.3)
lymph#: 1.4 10*3/uL (ref 0.9–3.3)

## 2017-07-24 MED ORDER — GABAPENTIN 300 MG PO CAPS: 600.0000 mg | ORAL_CAPSULE | Freq: Three times a day (TID) | ORAL | 1 refills | Status: DC

## 2017-07-24 MED ORDER — ANASTROZOLE 1 MG PO TABS: 1.0000 mg | ORAL_TABLET | Freq: Every day | ORAL | 4 refills | Status: DC

## 2017-07-24 MED ORDER — AMLODIPINE BESYLATE 5 MG PO TABS: 5.0000 mg | ORAL_TABLET | Freq: Every day | ORAL | 6 refills | Status: DC

## 2017-07-24 MED ORDER — TRAZODONE HCL 50 MG PO TABS: ORAL_TABLET | ORAL | 1 refills | Status: DC

## 2017-07-24 MED FILL — AMLODIPINE BESYLATE 5 MG TA: 5 | 90 days supply | Qty: 90 | Fill #0

## 2017-07-24 MED FILL — GABAPENTIN 300 MG CAPS: 300 | 30 days supply | Qty: 180 | Fill #0

## 2017-07-24 MED FILL — traZODone HCL 50 MG TABS: 50 | 30 days supply | Qty: 60 | Fill #0

## 2017-07-24 NOTE — Progress Notes (Signed)
07/24/2017 [facility] AFT-05 PALLAS Cycle 18      Due to holiday and physician scheduling, Cycle 18 study visit is being conducted early today, within the -7 day window allowed per protocol. Upon arrival to the clinic, PRO and adherence questionnaires were completed independently by the patient. Patient proceeded to lab for collection of standard of care blood samples as well as protocol-required HgbA1c. Patient returned her empty palbociclib bottles for Cycles 15-17, and these were returned to pharmacist, Raul Del, for proper accountability. Completed palbociclib diaries for cycles 15-17 were returned by the patient today, as well as completed anti-hormone therapy diaries for Cycles 15 and 16. A copy of the partially-completed (in progress) Cycle 17 anti-hormone therapy diary was retained by the site, with the original returned to the patient for completion. The patient was given  a postage-paid, pre-addressed envelope for the Research Department to mail the Cycle 17 anti-hormone diary in, once completed. Based on lab results review and history and physical by Dr. Jana Hakim, patient condition is acceptable for continued treatment. Patient was given copies of diaries for both medications for Cycles 18-20, along with three bottles of study medication for the next three cycles. Reviewed study medication and anti-hormone therapy instructions with patient; she will continue daily dosing with anastrozole, and she will wait until 07/29/17 to begin the next cycle of dosing with palbociclib.  Patient reports that previously ongoing adverse events remain unchanged in grade, and have not resolved, although she notes that peripheral neuropathy occurs intermittently and seems to be better since she has returned to work. She has no new symptoms to report. Cindy S. Brigitte Pulse BSN, RN, CCRP 07/24/2017 3:44 PM  Adverse Event Log PALLAS Cycles 15-17: 05/06/2017 - 07/24/2017 Event Grade Onset Date Resolved Date Attribution to  palbociclib Attribution to anastrozole Treatment Comments  Dry skin Grade 1 04/04/2016 ongoing No Yes    Itching Grade 1 04/04/2016 ongoing No No    Joint pain Grade 2 04/19/2016 ongoing No No Tramadol 10/1 improved to grade 1  Hypertension, intermittent Grade 3 05/30/2016 ongoing No No Hyzaar Added amlodipine  Depression Grade 2 07/17/2016 ongoing No No    Decreased concentration Grade 1 07/17/2016 ongoing Yes Yes    Vaginal dryness Grade 2 09/2016 ongoing No Yes Coconut oil   Peripheral neuropathy, int Grade 2 11/19/2016 ongoing No No    Fatigue Grade 1 12/20/2016 ongoing Yes No    Insomnia Grade 2 12/20/16 ongoing No No Trazodone   Allergic rhinitis Grade 2 6/UN/2018 ongoing No No Benadryl   Type 2 DM Grade 2 03/28/2017 ongoing No No Metformin   Hypokalemia Grade 1 03/28/2017 ongoing No No  Supplement  Right knee pain Grade 1 05/06/2017 ongoing No No  Improved from Grade 3  Tachycardia Grade 1 07/24/2017 ongoing No No    Weight loss Grade 1 07/24/2017 ongoing No No    Out of range hematology and serum total protein lab results were not considered clinically significant. Cindy S. Brigitte Pulse BSN, RN, [place] 08/16/2017 3:31 PM  [facility] [id number] BWEL Month 18 Upon arrival to the clinic, the Follow-up questionnaire was completed independently by the patient. Note that study visit is being conducted early, due to holiday scheduling, physician availability, and patient's return to work in January. Therefore, in order to avoid missing assessment, BWEL visit assessments were conducted today at the time of the AFT-05 PALLAS study visit, within the tighter timeline required for the PALLAS treatment trial. Patient denies the occurrence of any fractures, sprains, tendon/ligament injuries or  orthopedic surgeries in the past six months since the last BWEL study visit in June. She has received treatment at her PCP's office for pain in her right knee, with fluid aspiration and steroid injection. Weight and waist and hip  circumference measurements were obtained per protocol, and following the instructions in the BWEL Weight and Height Protocol document. Thanked patient for her willingness to participate in this research study. Cindy S. Brigitte Pulse BSN, RN, [place] 07/24/2017 3:44 PM

## 2017-07-25 LAB — HEMOGLOBIN A1C
Est. average glucose Bld gHb Est-mCnc: 140 mg/dL
Hemoglobin A1c: 6.5 % — ABNORMAL HIGH (ref 4.8–5.6)

## 2017-07-25 MED ORDER — INV-PALBOCICLIB 125 MG CAPS #23 ALLIANCE FOUNDATION AFT-05 (PALLAS): 125.0000 mg | ORAL_CAPSULE | Freq: Every day | ORAL | 0 refills | Status: DC

## 2017-07-26 ENCOUNTER — Ambulatory Visit: Payer: Self-pay | Admitting: Family Medicine

## 2017-07-26 ENCOUNTER — Telehealth: Payer: Self-pay | Admitting: Oncology

## 2017-07-26 NOTE — Telephone Encounter (Signed)
Left message for patient regarding upcoming March 2019 appointments.  °

## 2017-07-29 ENCOUNTER — Other Ambulatory Visit: Payer: Self-pay

## 2017-07-29 ENCOUNTER — Ambulatory Visit: Payer: Self-pay | Admitting: Oncology

## 2017-08-07 ENCOUNTER — Other Ambulatory Visit: Payer: Self-pay

## 2017-08-26 ENCOUNTER — Other Ambulatory Visit: Payer: Self-pay | Admitting: Oncology

## 2017-08-26 MED FILL — valACYclovir HCL 1 GM TABS: 1 | 45 days supply | Qty: 90 | Fill #0

## 2017-08-26 MED FILL — traZODone HCL 50 MG TABS: 50 | 30 days supply | Qty: 60 | Fill #1

## 2017-08-26 MED FILL — GABAPENTIN 300 MG CAPS: 300 | 30 days supply | Qty: 180 | Fill #1

## 2017-08-26 MED FILL — ALPRAZolam 1 MG TABS: 1 | 30 days supply | Qty: 60 | Fill #1

## 2017-08-26 MED FILL — POTASSIUM CL ER 10 MEQ CAP: 10 | 45 days supply | Qty: 90 | Fill #4

## 2017-08-26 MED FILL — traMADol HCL 50 MG TABS: 50 | 20 days supply | Qty: 60 | Fill #0

## 2017-10-09 ENCOUNTER — Other Ambulatory Visit: Payer: Self-pay | Admitting: Oncology

## 2017-10-09 DIAGNOSIS — Z170 Estrogen receptor positive status [ER+]: Principal | ICD-10-CM

## 2017-10-09 DIAGNOSIS — C50211 Malignant neoplasm of upper-inner quadrant of right female breast: Secondary | ICD-10-CM

## 2017-10-09 DIAGNOSIS — C50412 Malignant neoplasm of upper-outer quadrant of left female breast: Secondary | ICD-10-CM

## 2017-10-09 MED FILL — LOSARTAN-HCTZ 100-25 MG TAB: 100-25 | 90 days supply | Qty: 90 | Fill #1

## 2017-10-09 MED FILL — metFORMIN HCL 500 MG TABS: 500 | 30 days supply | Qty: 60 | Fill #0

## 2017-10-09 MED FILL — IBUPROFEN 800 MG TAB: 800 | 30 days supply | Qty: 90 | Fill #0

## 2017-10-09 MED FILL — traZODone HCL 50 MG TABS: 50 | 30 days supply | Qty: 60 | Fill #1

## 2017-10-09 MED FILL — valACYclovir HCL 1 GM TABS: 1 | 45 days supply | Qty: 90 | Fill #1

## 2017-10-09 MED FILL — ANASTROZOLE 1 MG TABLET: 1 | 90 days supply | Qty: 90 | Fill #0

## 2017-10-09 MED FILL — POTASSIUM CL ER 10 MEQ CAP: 10 | 45 days supply | Qty: 90 | Fill #0

## 2017-10-09 MED FILL — VENLAFAXINE HCL ER 150 MG C: 150 | 90 days supply | Qty: 90 | Fill #2

## 2017-10-19 NOTE — Progress Notes (Signed)
Margaret [name]  [phone] [phone]   ID: BRIUNNA LEICHT DOB: 07-24-1961  MR#: [id number]  OJJ#:[id number]  Patient Care Team: Hoyt Koch, MD as PCP - General (Internal Medicine) Jovita Kussmaul, MD as Consulting Physician (General Surgery) Mikeal Winstanley, Virgie Dad, MD as Consulting Physician (Oncology) Gery Pray, MD as Consulting Physician (Radiation Oncology) Mauro Kaufmann, RN as Registered Nurse Rockwell Germany, RN as Registered Nurse Benson [place], RN as Registered Nurse Rosemarie Ax, MD as Consulting Physician (Family Medicine) PCP: Hoyt Koch, MD GYN: OTHER MD:  CHIEF COMPLAINT:  estrogen receptor positive breast cancer; bilateral   CURRENT TREATMENT: anastrozole, palbociclib  BREAST CANCER HISTORY: From the original intake note:  Margaret [name]'s primary care physician retired sometime ago and her health maintenance has not been up-to-date. She has been receiving medical care through the emergency room and was seen in March 2015 following a head injury, then in August 2015 for lancing of an abscess. In December 2015 she noticed that her left breast looked a little bit smaller than her right. She brought this to the attention of friends and family over the next several months but the general feeling was that most people are not perfectly symmetrical. By the summer of this year she noticed that her nipple on the left was "going in". More recently she started developing pain in the left breast. She was evaluated for this in the emergency room 04/09/2015 at which time a left breast exam showed a deformed left breast with a large hard mass encompassing most of the breast, with nipple retraction. There was no nipple discharge or bleeding and no palpable adenopathy.  The patient was referred to [facility] and on 04/13/2015 she underwent bilateral diagnostic mammography with tomosynthesis as well as bilateral breast ultrasound. The breast  density was category B. In the right breast at the 11:00 position there was a 1.3 cm irregular mass which by ultrasonography measured 1.3 cm.--In the left breast there was a 4 cm irregular mass at the 2:00 position associated with nipple retraction. There was a second, 1.7 cm area of architectural distortion at the 9:00 position. Both were palpable. By ultrasonography, the 4 cm irregular mass was noted, with a second mass measuring 2.5 cm by ultrasonography. Both axillae were benign.  On 04/19/2015 the patient underwent right breast upper outer quadrant biopsy, showing ([id number]) and invasive ductal carcinoma, grade 1, estrogen receptor 90% positive, progesterone receptor negative, with an MIB-1 of 5%, and no HER-2 amplification, the signals ratio being 1.26 and the number per cell 2.20.  On the same day, she underwent biopsy of the 2 left breast masses in question as well as a suspicious left axillary lymph node. All 3 biopsies showed invasive ductal carcinoma, grade 2, estrogen receptor 80-100% positive, progesterone receptor 80-100% positive, with the MIB-1 between 5 and 10%, and no HER-2 amplification, the signals ratio being between 1.05 and 1.13, and the number per cell between 2.10 and 2.25.  The patient's subsequent history is as detailed below  INTERVAL HISTORY: Margaret [name] returns today for follow-up and treatment of her estrogen receptor positive breast cancer.  She continues on anastrozole, with good tolerance.    She is also on palbociclib, currently at 125 mg daily, 21 days on and 7 days off. She is currently on her off week and she is out of the prescription. She tolerates this medication well overall.    REVIEW OF SYSTEMS:  Anastasiya reports that for exercise, she walks  at her place of employment. She notes generalized weakness, clammy, and lightheaded for the past month. She reports that her symptoms are worsened at work when the heat is on. She has a burning sensation to her skin. She  works at [employer] in [employer], which she loves. She has resolved urinary frequency. She no longer has bilateral knee pain. She notes that she has pain to her lateral legs. She will have a disability hearing on 11/04/2017 and she would be able to work 5.5 hours while on disability. She denies unusual headaches, visual changes, nausea, vomiting, or dizziness. There has been no unusual cough, phlegm production, or pleurisy. This been no change in bowel or bladder habits. She denies unexplained fatigue or unexplained weight loss, bleeding, rash, or fever. A detailed review of systems was otherwise stable.    PAST MEDICAL HISTORY: Past Medical History:  Diagnosis Date  . Bilateral breast cancer ([place])   . Breast cancer ([place])   . Breast cancer of upper-outer quadrant of left female breast ([place]) 04/21/2015  . GERD (gastroesophageal reflux disease)   . Hypertension     PAST SURGICAL HISTORY: Past Surgical History:  Procedure Laterality Date  . ABDOMINAL HYSTERECTOMY  1990  . MASTECTOMY MODIFIED RADICAL Left 05/09/2015  . MASTECTOMY MODIFIED RADICAL Left 05/09/2015   Procedure: LEFT MODIFIED RADICAL MASTETCTOMY;  Surgeon: Autumn Messing III, MD;  Location: [facility];  Service: General;  Laterality: Left;  Marland Kitchen MASTECTOMY W/ SENTINEL NODE BIOPSY Right   . MASTECTOMY W/ SENTINEL NODE BIOPSY Right 05/09/2015   Procedure: RIGHT MASTECTOMY WITH RIGHT AXILLARY SENTINEL LYMPH NODE BIOPSY;  Surgeon: Autumn Messing III, MD;  Location: [facility];  Service: General;  Laterality: Right;  . PORTACATH PLACEMENT Right 09/01/2015   Procedure: INSERTION PORT-A-CATH;  Surgeon: Autumn Messing III, MD;  Location: [facility];  Service: General;  Laterality: Right;  . TUBAL LIGATION  1984    FAMILY HISTORY Family History  Problem Relation Age of Onset  . Hypertension Mother   . Diabetes Mother     the patient has little information on her father. Her mother is living, currently age 32. She has one brother, 2 sisters. There  is no history of breast or ovarian cancer in the family to her knowledge.   GYNECOLOGIC HISTORY:  No LMP recorded. Patient has had a hysterectomy.  menarche age 56, first live birth age 89. She is GX P4. She underwent a total abdominal hysterectomy with bilateral salpingo-oophorectomy at age 78. She did not take hormone replacement. She never used oral contraceptives.   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 She previously worked the same position at [employer] for 25+ years. She is single and lives alone  The patient's daughter  [name] works as a Programmer, applications, as does her daughter  [name]. Daughter [name] works at [employer], as a call. All 3 live in [place]. The patient's son Marc Morgans Kulig's lives in [place]. He prepares sets for shows The patient has 4 grandchildren, no great grandchildren. She attends a [employer] in [place].    ADVANCED DIRECTIVES: Not in place. At the initial clinic visit the patient was given the appropriate forms to complete and notarize at her discretion. The patient intends to name her daughter Sampson Si as healthcare power of attorney. She can be reached at [phone].  HEALTH MAINTENANCE: Social History   Tobacco Use  . Smoking status: Former Smoker  Packs/day: 0.10    Years: 28.00    Pack years: 2.80  . Smokeless tobacco: Never Used  Substance Use Topics  . Alcohol use: No    Alcohol/week: 0.0 oz  . Drug use: No     Colonoscopy: Never  PAP: Status post hysterectomy  Bone density: Never  Lipid panel:  Allergies  Allergen Reactions  . Banana Hives    Tongue itching  . Latex Itching and Other (See Comments)    burning  . 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 Wheat Bran Hives    Current Outpatient Medications  Medication Sig Dispense Refill  . ALPRAZolam (XANAX) 1 MG tablet Take 1 tablet (1 mg total) by mouth 2 (two)  times daily as needed for anxiety. 60 tablet 1  . amLODipine (NORVASC) 5 MG tablet Take 1 tablet (5 mg total) by mouth daily. 90 tablet 6  . anastrozole (ARIMIDEX) 1 MG tablet Take 1 tablet (1 mg total) by mouth daily. 90 tablet 4  . calcium-vitamin D (OSCAL WITH D) 500-200 MG-UNIT tablet Take 1 tablet by mouth 2 (two) times daily. 60 tablet 0  . diphenhydrAMINE (BENADRYL) 25 mg capsule Take 1 capsule (25 mg total) by mouth every 6 (six) hours as needed. 100 capsule 3  . gabapentin (NEURONTIN) 300 MG capsule Take 2 capsules (600 mg total) by mouth 3 (three) times daily. 180 capsule 1  . ibuprofen (ADVIL,MOTRIN) 800 MG tablet TAKE 1 TABLET (800 MG TOTAL) BY MOUTH EVERY 8 HOURS AS NEEDED. 90 tablet 3  . Investigational palbociclib (IBRANCE) 125 MG capsule [facility] AFT-05 [facility] Take 1 capsule (125 mg total) by mouth daily. Take with food. Swallow whole. Do not chew. Take on days 1-21. Repeat every 28 days. 63 capsule 0  . losartan-hydrochlorothiazide (HYZAAR) 100-25 MG tablet Take 1 tablet by mouth daily. 90 tablet 3  . metFORMIN (GLUCOPHAGE) 500 MG tablet Take 1 tablet (500 mg total) by mouth 2 (two) times daily with a meal. 60 tablet 2  . pantoprazole (PROTONIX) 40 MG tablet Take 1 tablet (40 mg total) by mouth daily. 30 tablet 3  . potassium chloride (MICRO-K) 10 MEQ CR capsule Take 1 capsule (10 mEq total) by mouth 2 (two) times daily. 90 capsule 4  . traMADol (ULTRAM) 50 MG tablet TAKE 1 TABLET BY MOUTH EVERY 8 HOURS AS NEEDED 60 tablet 0  . traZODone (DESYREL) 50 MG tablet TAKE 1 TABLET BY MOUTH AT BEDTIME. MAY INCREASE TO 2 TABLETS AFTER A FEW DAYS IF NEEDED 60 tablet 1  . valACYclovir (VALTREX) 1000 MG tablet Take 1 tablet (1,000 mg total) by mouth 2 (two) times daily. 90 tablet 4  . venlafaxine XR (EFFEXOR-XR) 150 MG 24 hr capsule Take 1 capsule (150 mg total) by mouth daily with breakfast. 90 capsule 3   No current facility-administered medications for this visit.        OBJECTIVE: Middle-aged African-American woman in no acute distress Vitals:   10/21/17 1220  BP: 131/73  Pulse: 87  Resp: 18  Temp: 98.4 F (36.9 C)  SpO2: 97%     Body mass index is 40.29 kg/m.    ECOG FS:2 - Symptomatic, <50% confined to bed    Sclerae unicteric, EOMs intact Oropharynx clear and moist No cervical or supraclavicular adenopathy Lungs no rales or rhonchi Heart regular rate and rhythm Abd soft, nontender, positive bowel sounds MSK no focal spinal tenderness, no upper extremity lymphedema Neuro: nonfocal,  well oriented, appropriate affect Breasts: Status post bilateral mastectomies.  There is no evidence of chest wall recurrence.  Both axillae are benign  LAB RESULTS:  CMP     Component Value Date/Time   NA 142 07/24/2017 1407   K 3.3 (L) 07/24/2017 1407   CL 102 06/29/2015 0923   CO2 26 07/24/2017 1407   GLUCOSE 110 07/24/2017 1407   BUN 13.4 07/24/2017 1407   CREATININE 1.0 07/24/2017 1407   CALCIUM 9.6 07/24/2017 1407   PROT 8.5 (H) 07/24/2017 1407   ALBUMIN 3.9 07/24/2017 1407   AST 23 07/24/2017 1407   ALT 28 07/24/2017 1407   ALKPHOS 84 07/24/2017 1407   BILITOT 0.30 07/24/2017 1407   GFRNONAA >60 06/29/2015 0923   GFRAA >60 06/29/2015 0923    INo results found for: SPEP, UPEP  Lab Results  Component Value Date   WBC 6.3 10/21/2017   NEUTROABS 3.2 10/21/2017   HGB 14.2 10/21/2017   HCT 42.3 10/21/2017   MCV 103.3 (H) 10/21/2017   PLT 172 10/21/2017      Chemistry      Component Value Date/Time   NA 142 07/24/2017 1407   K 3.3 (L) 07/24/2017 1407   CL 102 06/29/2015 0923   CO2 26 07/24/2017 1407   BUN 13.4 07/24/2017 1407   CREATININE 1.0 07/24/2017 1407   GLU 157 (H) 08/17/2016 0818      Component Value Date/Time   CALCIUM 9.6 07/24/2017 1407   ALKPHOS 84 07/24/2017 1407   AST 23 07/24/2017 1407   ALT 28 07/24/2017 1407   BILITOT 0.30 07/24/2017 1407       No results found for: LABCA2  No components found for:  LABCA125  No results for input(s): INR in the last 168 hours.  Urinalysis    Component Value Date/Time   COLORURINE YELLOW 06/29/2015 1030   APPEARANCEUR CLOUDY (A) 06/29/2015 1030   LABSPEC 1.019 06/29/2015 1030   PHURINE 7.0 06/29/2015 1030   GLUCOSEU NEGATIVE 06/29/2015 1030   HGBUR TRACE (A) 06/29/2015 1030   BILIRUBINUR NEGATIVE 06/29/2015 1030   KETONESUR NEGATIVE 06/29/2015 1030   PROTEINUR NEGATIVE 06/29/2015 1030   NITRITE NEGATIVE 06/29/2015 1030   LEUKOCYTESUR MODERATE (A) 06/29/2015 1030    STUDIES: No results found.   ASSESSMENT: 57 y.o. [place] woman with synchronous breast cancers, as follows  (a) status post right breast upper outer quadrant biopsy 04/19/2015 for a clinical pT1c N0, stage IA invasive ductal carcinoma, grade 1, estrogen receptor positive, progesterone receptor negative, with an MIB-1 of 5%, and no HER-2 amplification  (b) status post left breast upper outer quadrant biopsy 2 and left axillary lymph node biopsy 04/19/2015 for a clinical T3 N1, stage IIIA invasive ductal carcinoma, grade 1 or 2, estrogen receptor and progesterone receptor positive, HER-2 negative, with an MIB-1 between 5 and 10%  (1) status post bilateral mastectomies 05/09/2015, showing:   (a) on the right side, a pT2 pN0, stage IIA invasive ductal carcinoma, with negative margins and repeat HER-2 again negative    (b) on the left, a pT3 pN2, stage IIIA invasive ductal carcinoma, grade 2, with negative margins, and repeat HER-2 again negative  (2) Mammaprint from the left-sided tumor returned "luminal type, low risk", predicting a small benefit from chemotherapy with the important caveat that N2 disease was not included in the MINDACT study--patient opted for chemotherapy  (3) Oncotype from the right-sided tumor showed a score of 12, predicting an 8% risk of recurrence outside the breast within  the next 10 years, if the patient's only systemic therapy was tamoxifen for 5 years.  It also predicted no significant benefit from chemotherapy  (4) postmastectomy radiation to the left chest wall completed 08/15/2015  (5) adjuvant chemotherapy consisting of cyclophosphamide and docetaxel x4 completed on 11/21/15.  (5) anastrozole started 02/08/2016  (6) left upper extremity lymphedema  (7) signed for B-WELL study 02/08/2016  (8) participating in [place] trial as of August 2017  (a) randomized to palbociclib, starting October 2017  PLAN: Zoria is currently just about 2-1/2 years out from definitive surgery for her breast cancer with no evidence of disease recurrence.  This is very favorable.  She is tolerating anastrozole and palbociclib generally well, with minimal side effects directly attributable to those medications.  She is very fatigued.  This is likely multifactorial but deconditioning has to be part of it.  She could be hypothyroid and we are going to obtain a TSH with her next set of labs.  Also contributing may be dehydration and low potassium.  I asked her to drink at least 1-1/2 or preferably 2 quarts of fluids daily.  I also doubled up on her potassium to 10 mEq twice daily.  Hopefully this will give her a little bit more energy  She will see [place] again in June.  She knows to call for any problems that may develop before then.   Leilanee Righetti, Virgie Dad, MD  10/21/17 12:35 PM Medical Oncology and Hematology [facility] [address] [place], [place] [address] Tel. [phone]    Fax. [phone]    This document serves as a record of services personally performed by Lurline Del, MD. It was created on his behalf by Steva Colder, a trained medical scribe. The creation of this record is based on the scribe's personal observations and the provider's statements to them.   I have reviewed the above documentation for accuracy and completeness, and I agree with the above.

## 2017-10-21 ENCOUNTER — Inpatient Hospital Stay (HOSPITAL_BASED_OUTPATIENT_CLINIC_OR_DEPARTMENT_OTHER): Payer: Self-pay | Admitting: Oncology

## 2017-10-21 ENCOUNTER — Inpatient Hospital Stay: Payer: Self-pay | Admitting: *Deleted

## 2017-10-21 ENCOUNTER — Encounter: Payer: Self-pay | Admitting: *Deleted

## 2017-10-21 ENCOUNTER — Inpatient Hospital Stay: Payer: Self-pay | Attending: Oncology

## 2017-10-21 VITALS — BP 131/73 | HR 87 | Temp 98.4°F | Resp 18 | Ht 69.0 in | Wt 272.8 lb

## 2017-10-21 DIAGNOSIS — E876 Hypokalemia: Secondary | ICD-10-CM | POA: Insufficient documentation

## 2017-10-21 DIAGNOSIS — Z79811 Long term (current) use of aromatase inhibitors: Secondary | ICD-10-CM | POA: Insufficient documentation

## 2017-10-21 DIAGNOSIS — C50412 Malignant neoplasm of upper-outer quadrant of left female breast: Secondary | ICD-10-CM

## 2017-10-21 DIAGNOSIS — Z9013 Acquired absence of bilateral breasts and nipples: Secondary | ICD-10-CM | POA: Insufficient documentation

## 2017-10-21 DIAGNOSIS — E860 Dehydration: Secondary | ICD-10-CM | POA: Insufficient documentation

## 2017-10-21 DIAGNOSIS — C50211 Malignant neoplasm of upper-inner quadrant of right female breast: Secondary | ICD-10-CM

## 2017-10-21 DIAGNOSIS — Z9221 Personal history of antineoplastic chemotherapy: Secondary | ICD-10-CM | POA: Insufficient documentation

## 2017-10-21 DIAGNOSIS — Z170 Estrogen receptor positive status [ER+]: Secondary | ICD-10-CM

## 2017-10-21 DIAGNOSIS — C50812 Malignant neoplasm of overlapping sites of left female breast: Secondary | ICD-10-CM | POA: Insufficient documentation

## 2017-10-21 DIAGNOSIS — Z923 Personal history of irradiation: Secondary | ICD-10-CM

## 2017-10-21 DIAGNOSIS — C50411 Malignant neoplasm of upper-outer quadrant of right female breast: Secondary | ICD-10-CM

## 2017-10-21 DIAGNOSIS — I890 Lymphedema, not elsewhere classified: Secondary | ICD-10-CM | POA: Insufficient documentation

## 2017-10-21 DIAGNOSIS — R5383 Other fatigue: Secondary | ICD-10-CM | POA: Insufficient documentation

## 2017-10-21 DIAGNOSIS — Z006 Encounter for examination for normal comparison and control in clinical research program: Secondary | ICD-10-CM | POA: Insufficient documentation

## 2017-10-21 LAB — COMPREHENSIVE METABOLIC PANEL
ALT: 52 U/L (ref 0–55)
AST: 35 U/L — ABNORMAL HIGH (ref 5–34)
Albumin: 3.7 g/dL (ref 3.5–5.0)
Alkaline Phosphatase: 93 U/L (ref 40–150)
Anion gap: 11 (ref 3–11)
BUN: 21 mg/dL (ref 7–26)
CO2: 28 mmol/L (ref 22–29)
Calcium: 10.3 mg/dL (ref 8.4–10.4)
Chloride: 102 mmol/L (ref 98–109)
Creatinine, Ser: 1.35 mg/dL — ABNORMAL HIGH (ref 0.60–1.10)
GFR calc Af Amer: 50 mL/min — ABNORMAL LOW (ref >60)
GFR calc non Af Amer: 43 mL/min — ABNORMAL LOW (ref >60)
Glucose, Bld: 169 mg/dL — ABNORMAL HIGH (ref 70–140)
Potassium: 3.1 mmol/L — ABNORMAL LOW (ref 3.5–5.1)
Sodium: 141 mmol/L (ref 136–145)
Total Bilirubin: 0.2 mg/dL (ref 0.2–1.2)
Total Protein: 8 g/dL (ref 6.4–8.3)

## 2017-10-21 LAB — CBC WITH DIFFERENTIAL/PLATELET
Basophils Absolute: 0.1 10*3/uL (ref 0.0–0.1)
Basophils Relative: 2 %
Eosinophils Absolute: 0.1 10*3/uL (ref 0.0–0.5)
Eosinophils Relative: 1 %
HCT: 42.3 % (ref 34.8–46.6)
Hemoglobin: 14.2 g/dL (ref 11.6–15.9)
Lymphocytes Relative: 38 %
Lymphs Abs: 2.4 10*3/uL (ref 0.9–3.3)
MCH: 34.6 pg — ABNORMAL HIGH (ref 25.1–34.0)
MCHC: 33.5 g/dL (ref 31.5–36.0)
MCV: 103.3 fL — ABNORMAL HIGH (ref 79.5–101.0)
Monocytes Absolute: 0.5 10*3/uL (ref 0.1–0.9)
Monocytes Relative: 8 %
Neutro Abs: 3.2 10*3/uL (ref 1.5–6.5)
Neutrophils Relative %: 51 %
Platelets: 172 10*3/uL (ref 145–400)
RBC: 4.1 MIL/uL (ref 3.70–5.45)
RDW: 14.6 % — ABNORMAL HIGH (ref 11.2–14.5)
WBC: 6.3 10*3/uL (ref 3.9–10.3)

## 2017-10-21 MED ORDER — INV-PALBOCICLIB 125 MG CAPS #23 ALLIANCE FOUNDATION AFT-05 (PALLAS): 125.0000 mg | ORAL_CAPSULE | Freq: Every day | ORAL | 0 refills | Status: DC

## 2017-10-21 MED ORDER — LORAZEPAM 0.5 MG PO TABS: 0.5000 mg | ORAL_TABLET | Freq: Three times a day (TID) | ORAL | 0 refills | Status: DC

## 2017-10-21 MED ORDER — TRAMADOL HCL 50 MG PO TABS: 50.0000 mg | ORAL_TABLET | Freq: Three times a day (TID) | ORAL | 0 refills | Status: DC | PRN

## 2017-10-21 MED ORDER — POTASSIUM CHLORIDE ER 10 MEQ PO CPCR: 10.0000 meq | ORAL_CAPSULE | Freq: Two times a day (BID) | ORAL | 4 refills | Status: DC

## 2017-10-21 MED ORDER — PANTOPRAZOLE SODIUM 40 MG PO TBEC: 40.0000 mg | DELAYED_RELEASE_TABLET | Freq: Every day | ORAL | 3 refills | Status: DC

## 2017-10-21 MED FILL — PANTOPRAZOLE SOD DR 40 MG T: 40 | 30 days supply | Qty: 30 | Fill #0

## 2017-10-21 MED FILL — traMADol HCL 50 MG TABS: 50 | 20 days supply | Qty: 60 | Fill #0

## 2017-10-21 MED FILL — LORazepam 0.5 MG TABS: 0.5 | 10 days supply | Qty: 30 | Fill #0

## 2017-10-21 NOTE — Progress Notes (Signed)
10/21/2017 11/06/2017 [facility] AFT-05 [facility] Cycle 18      Due to holiday and physician scheduling, Cycle 18 study visit is being conducted early today, within the -7 day window allowed per protocol. Upon arrival to the clinic, PRO and adherence questionnaires were completed independently by the patient. Patient proceeded to lab for collection of standard of care blood samples as well as protocol-required HgbA1c. Patient returned her empty palbociclib bottles for Cycles 15-17, and these were returned to pharmacist, Raul Del, for proper accountability. Completed palbociclib diaries for cycles 15-17 were returned by the patient today, as well as completed anti-hormone therapy diaries for Cycles 15 and 16. A copy of the partially-completed (in progress) Cycle 17 anti-hormone therapy diary was retained by the site, with the original returned to the patient for completion. The patient was given  a postage-paid, pre-addressed envelope for the Research Department to mail the Cycle 17 anti-hormone diary in, once completed. Based on lab results review and history and physical by Dr. Jana Hakim, patient condition is acceptable for continued treatment. Patient was given copies of diaries for both medications for Cycles 18-20, along with three bottles of study medication for the next three cycles. Reviewed study medication and anti-hormone therapy instructions with patient; she will continue daily dosing with anastrozole, and she will wait until 07/29/17 to begin the next cycle of dosing with palbociclib.  Patient reports that previously ongoing adverse events remain unchanged in grade, and have not resolved, although she notes that peripheral neuropathy occurs intermittently and seems to be better since she has returned to work. She has no new symptoms to report. Cindy S. Brigitte Pulse BSN, RN, CCRP 07/24/2017 3:44 PM  Adverse Event Log PALLAS Cycles 18-20: 07/24/2017 - 10/21/2017 Event Grade Onset Date Resolved Date  Attribution to palbociclib Attribution to anastrozole Treatment Comments  Dry skin Grade 1 04/04/2016 ongoing No Yes    Itching Grade 1 04/04/2016 ongoing No No    Joint pain Grade 2 04/19/2016 ongoing No No Tramadol 10/1 improved to grade 1  Hypertension, intermittent Grade 3 05/30/2016 ongoing No No Hyzaar Added amlodipine  Depression Grade 2 07/17/2016 ongoing No No    Decreased concentration Grade 1 07/17/2016 ongoing Yes Yes    Vaginal dryness Grade 2 09/2016 ongoing No Yes Coconut oil   Peripheral neuropathy, int Grade 2 11/19/2016 ongoing No No    Fatigue Grade 1 12/20/2016 09/23/2017 Yes No  Worsened in grade  Insomnia Grade 2 12/20/16 ongoing No No Trazodone   Allergic rhinitis Grade 2 6/UN/2018 ongoing No No Benadryl   Type 2 DM Grade 2 03/28/2017 ongoing No No Metformin   Hypokalemia Grade 1 03/28/2017 ongoing No No  Supplement  Right knee pain Grade 1 05/06/2017 10/21/2017 No No  Improved from Grade 3  Tachycardia Grade 1 07/24/2017 10/21/2017 No No    Weight loss Grade 1 07/24/2017 ongoing No No    Fatigue Grade 2 09/23/2017 ongoing Yes Yes    Generalized weakness Grade 2 09/23/2017 ongoing No No    Clammy skin Grade 1 09/23/2017 ongoing No No    Lightheadedness Grade 1 09/23/2017 ongoing No No    Skin burning sensation Grade 1 09/23/2017 ongoing No No    Lateral leg pain Grade 2 10/21/2017 ongoing No No    Increased creatinine Grade 1 10/21/2017 ongoing No No    Increased AST Grade 1 10/21/2017 ongoing Yes No    Dehydration Grade 1 10/21/2017 ongoing No No    Out of range hematology lab results were  not considered clinically significant. Cindy S. Brigitte Pulse BSN, RN, [place] 11/08/2017 4:40 PM

## 2017-11-08 ENCOUNTER — Telehealth: Payer: Self-pay | Admitting: Medical Oncology

## 2017-11-08 NOTE — Telephone Encounter (Signed)
Margaret [name] for her disability case needs referral order for lymphedema rx every other day for arm wrapping/exercises and uses machine 2 hours a day , stockings for arm and hands, one she sleeps in clinic and her sleeve . Pt called outpt rehab and pt was told to contact MD for order. Pt says she does her lympedema exercises bid and wears her sleeve daily. She has pump for lymphedema. Pt requests I fax records to pt lawyer [facility]) at [phone]. Records faxed.

## 2017-11-28 ENCOUNTER — Other Ambulatory Visit: Payer: Self-pay | Admitting: Oncology

## 2017-11-28 DIAGNOSIS — G4720 Circadian rhythm sleep disorder, unspecified type: Secondary | ICD-10-CM

## 2017-11-28 DIAGNOSIS — C50412 Malignant neoplasm of upper-outer quadrant of left female breast: Secondary | ICD-10-CM

## 2017-11-28 DIAGNOSIS — Z170 Estrogen receptor positive status [ER+]: Principal | ICD-10-CM

## 2017-11-28 DIAGNOSIS — C50211 Malignant neoplasm of upper-inner quadrant of right female breast: Secondary | ICD-10-CM

## 2017-11-28 MED FILL — metFORMIN HCL 500 MG TABS: 500 | 30 days supply | Qty: 60 | Fill #1

## 2017-11-28 MED FILL — traZODone HCL 50 MG TABS: 50 | 30 days supply | Qty: 60 | Fill #0

## 2017-11-28 MED FILL — LORazepam 0.5 MG TABS: 0.5 | 10 days supply | Qty: 30 | Fill #0

## 2017-11-28 MED FILL — AMLODIPINE BESYLATE 5 MG TA: 5 | 90 days supply | Qty: 90 | Fill #1

## 2017-11-28 MED FILL — PANTOPRAZOLE SOD DR 40 MG T: 40 | 30 days supply | Qty: 30 | Fill #1

## 2017-11-28 MED FILL — IBUPROFEN 800 MG TAB: 800 | 30 days supply | Qty: 90 | Fill #1

## 2017-11-28 MED FILL — traMADol HCL 50 MG TABS: 50 | 20 days supply | Qty: 60 | Fill #0

## 2017-12-26 ENCOUNTER — Other Ambulatory Visit: Payer: Self-pay | Admitting: *Deleted

## 2017-12-26 DIAGNOSIS — C50412 Malignant neoplasm of upper-outer quadrant of left female breast: Secondary | ICD-10-CM

## 2017-12-26 DIAGNOSIS — Z170 Estrogen receptor positive status [ER+]: Principal | ICD-10-CM

## 2018-01-06 ENCOUNTER — Other Ambulatory Visit: Payer: Self-pay | Admitting: Oncology

## 2018-01-06 DIAGNOSIS — C50211 Malignant neoplasm of upper-inner quadrant of right female breast: Secondary | ICD-10-CM

## 2018-01-06 DIAGNOSIS — Z170 Estrogen receptor positive status [ER+]: Principal | ICD-10-CM

## 2018-01-06 DIAGNOSIS — C50412 Malignant neoplasm of upper-outer quadrant of left female breast: Secondary | ICD-10-CM

## 2018-01-06 MED FILL — traZODone HCL 50 MG TABS: 50 | 30 days supply | Qty: 60 | Fill #1

## 2018-01-06 MED FILL — valACYclovir HCL 1 GM TABS: 1 | 30 days supply | Qty: 60 | Fill #2

## 2018-01-06 MED FILL — POTASSIUM CL ER 10 MEQ CAP: 10 | 30 days supply | Qty: 60 | Fill #0

## 2018-01-06 MED FILL — IBUPROFEN 800 MG TAB: 800 | 30 days supply | Qty: 90 | Fill #2

## 2018-01-06 MED FILL — PANTOPRAZOLE SOD DR 40 MG T: 40 | 30 days supply | Qty: 30 | Fill #2

## 2018-01-06 MED FILL — LOSARTAN-HCTZ 100-25 MG TAB: 100-25 | 30 days supply | Qty: 30 | Fill #2

## 2018-01-06 MED FILL — metFORMIN HCL 500 MG TABS: 500 | 30 days supply | Qty: 60 | Fill #2

## 2018-01-06 MED FILL — ANASTROZOLE 1 MG TABLET: 1 | 30 days supply | Qty: 30 | Fill #1

## 2018-01-08 ENCOUNTER — Inpatient Hospital Stay: Payer: Medicare Other | Attending: Oncology

## 2018-01-08 DIAGNOSIS — C50411 Malignant neoplasm of upper-outer quadrant of right female breast: Secondary | ICD-10-CM | POA: Insufficient documentation

## 2018-01-08 DIAGNOSIS — Z79811 Long term (current) use of aromatase inhibitors: Secondary | ICD-10-CM | POA: Diagnosis not present

## 2018-01-08 DIAGNOSIS — I890 Lymphedema, not elsewhere classified: Secondary | ICD-10-CM | POA: Insufficient documentation

## 2018-01-08 DIAGNOSIS — Z006 Encounter for examination for normal comparison and control in clinical research program: Secondary | ICD-10-CM | POA: Insufficient documentation

## 2018-01-08 DIAGNOSIS — C50412 Malignant neoplasm of upper-outer quadrant of left female breast: Secondary | ICD-10-CM | POA: Diagnosis not present

## 2018-01-08 DIAGNOSIS — Z923 Personal history of irradiation: Secondary | ICD-10-CM | POA: Insufficient documentation

## 2018-01-08 DIAGNOSIS — Z170 Estrogen receptor positive status [ER+]: Secondary | ICD-10-CM

## 2018-01-08 DIAGNOSIS — Z9221 Personal history of antineoplastic chemotherapy: Secondary | ICD-10-CM | POA: Diagnosis not present

## 2018-01-08 DIAGNOSIS — C773 Secondary and unspecified malignant neoplasm of axilla and upper limb lymph nodes: Secondary | ICD-10-CM | POA: Insufficient documentation

## 2018-01-08 DIAGNOSIS — C50211 Malignant neoplasm of upper-inner quadrant of right female breast: Secondary | ICD-10-CM

## 2018-01-08 DIAGNOSIS — Z9013 Acquired absence of bilateral breasts and nipples: Secondary | ICD-10-CM | POA: Insufficient documentation

## 2018-01-08 LAB — CMP (CANCER CENTER ONLY)
ALT: 25 U/L (ref 0–55)
AST: 20 U/L (ref 5–34)
Albumin: 4.1 g/dL (ref 3.5–5.0)
Alkaline Phosphatase: 100 U/L (ref 40–150)
Anion gap: 10 (ref 3–11)
BUN: 17 mg/dL (ref 7–26)
CO2: 27 mmol/L (ref 22–29)
Calcium: 10.1 mg/dL (ref 8.4–10.4)
Chloride: 103 mmol/L (ref 98–109)
Creatinine: 0.88 mg/dL (ref 0.60–1.10)
GFR, Est AFR Am: >60 mL/min (ref >60)
GFR, Estimated: >60 mL/min (ref >60)
Glucose, Bld: 108 mg/dL (ref 70–140)
Potassium: 3.4 mmol/L — ABNORMAL LOW (ref 3.5–5.1)
Sodium: 140 mmol/L (ref 136–145)
Total Bilirubin: <0.2 mg/dL — ABNORMAL LOW (ref 0.2–1.2)
Total Protein: 8.5 g/dL — ABNORMAL HIGH (ref 6.4–8.3)

## 2018-01-08 LAB — CBC WITH DIFFERENTIAL (CANCER CENTER ONLY)
Basophils Absolute: 0.1 10*3/uL (ref 0.0–0.1)
Basophils Relative: 2 %
Eosinophils Absolute: 0.1 10*3/uL (ref 0.0–0.5)
Eosinophils Relative: 1 %
HCT: 39.9 % (ref 34.8–46.6)
Hemoglobin: 13.8 g/dL (ref 11.6–15.9)
Lymphocytes Relative: 32 %
Lymphs Abs: 1.8 10*3/uL (ref 0.9–3.3)
MCH: 36.4 pg — ABNORMAL HIGH (ref 25.1–34.0)
MCHC: 34.6 g/dL (ref 31.5–36.0)
MCV: 105.1 fL — ABNORMAL HIGH (ref 79.5–101.0)
Monocytes Absolute: 0.3 10*3/uL (ref 0.1–0.9)
Monocytes Relative: 6 %
Neutro Abs: 3.4 10*3/uL (ref 1.5–6.5)
Neutrophils Relative %: 59 %
Platelet Count: 217 10*3/uL (ref 145–400)
RBC: 3.79 MIL/uL (ref 3.70–5.45)
RDW: 14.5 % (ref 11.2–14.5)
WBC Count: 5.8 10*3/uL (ref 3.9–10.3)

## 2018-01-08 LAB — TSH: TSH: 0.947 u[IU]/mL (ref 0.308–3.960)

## 2018-01-10 ENCOUNTER — Other Ambulatory Visit: Payer: Self-pay | Admitting: *Deleted

## 2018-01-10 NOTE — Progress Notes (Signed)
[name]  [phone] [phone]) [phone]   ID: JAZZY PARMER DOB: 1960-09-22  MR#: [id number]  MIW#:[id number]  Patient Care Team: Margaret Koch, MD as PCP - General (Internal Medicine) Margaret Kussmaul, MD as Consulting Physician (General Surgery) Magrinat, Virgie Dad, MD as Consulting Physician (Oncology) Margaret Pray, MD as Consulting Physician (Radiation Oncology) Margaret Kaufmann, RN as Registered Nurse Margaret Germany, RN as Registered Nurse Margaret Norway, RN as Registered Nurse Margaret Ax, MD as Consulting Physician (Family Medicine) PCP: Margaret Koch, MD GYN: OTHER MD:  CHIEF COMPLAINT:  estrogen receptor positive breast cancer; bilateral   CURRENT TREATMENT: anastrozole, palbociclib  BREAST CANCER HISTORY: From the original intake note:  Margaret [name]'s primary care physician retired sometime ago and her health maintenance has not been up-to-date. She has been receiving medical care through the emergency room and was seen in March 2015 following a head injury, then in August 2015 for lancing of an abscess. In December 2015 she noticed that her left breast looked a little bit smaller than her right. She brought this to the attention of friends and family over the next several months but the general feeling was that most people are not perfectly symmetrical. By the summer of this year she noticed that her nipple on the left was "going in". More recently she started developing pain in the left breast. She was evaluated for this in the emergency room 04/09/2015 at which time a left breast exam showed a deformed left breast with a large hard mass encompassing most of the breast, with nipple retraction. There was no nipple discharge or bleeding and no palpable adenopathy.  The patient was referred to [facility] and on 04/13/2015 she underwent bilateral diagnostic mammography with tomosynthesis as well as bilateral breast ultrasound. The breast  density was category B. In the right breast at the 11:00 position there was a 1.3 cm irregular mass which by ultrasonography measured 1.3 cm.--In the left breast there was a 4 cm irregular mass at the 2:00 position associated with nipple retraction. There was a second, 1.7 cm area of architectural distortion at the 9:00 position. Both were palpable. By ultrasonography, the 4 cm irregular mass was noted, with a second mass measuring 2.5 cm by ultrasonography. Both axillae were benign.  On 04/19/2015 the patient underwent right breast upper outer quadrant biopsy, showing ([id number]) and invasive ductal carcinoma, grade 1, estrogen receptor 90% positive, progesterone receptor negative, with an MIB-1 of 5%, and no HER-2 amplification, the signals ratio being 1.26 and the number per cell 2.20.  On the same day, she underwent biopsy of the 2 left breast masses in question as well as a suspicious left axillary lymph node. All 3 biopsies showed invasive ductal carcinoma, grade 2, estrogen receptor 80-100% positive, progesterone receptor 80-100% positive, with the MIB-1 between 5 and 10%, and no HER-2 amplification, the signals ratio being between 1.05 and 1.13, and the number per cell between 2.10 and 2.25.  The patient's subsequent history is as detailed below  INTERVAL HISTORY: Margaret [name] returns today for follow-up and treatment of her estrogen receptor positive breast cancer.  She continues on anastrozole, with good tolerance. She has hot flashes. She denies issues with vaginal dryness.   She is also on palbociclib, currently at 125 mg daily, 21 days on and 7 days off. She tolerates this well. She had some oral sores. She also has some occasional fatigue and nausea.    REVIEW OF SYSTEMS:  Margaret [name]  reports that she was approved for disability. She is going to try to work a little, part-time, just to get out of the house.. She has an appointment to see Margaret [name] for her left arm and hand nerve sensitivity and  tingling. She notes that this is painful.  She describes it as an Programmer, applications.  She tries to exercise her arm and increase ROM at least twice per day. She also wears a compression sleeve and glove.  Sometimes she feels depressed, and she takes venlafaxine. She notes that her eyes are tearing. She denies unusual headaches, visual changes, nausea, vomiting, or dizziness. There has been no unusual cough, phlegm production, or pleurisy. This been no change in bowel or bladder habits. She denies unexplained fatigue or unexplained weight loss, bleeding, rash, or fever. A detailed review of systems was otherwise stable.    PAST MEDICAL HISTORY: Past Medical History:  Diagnosis Date  . Bilateral breast cancer ([place])   . Breast cancer ([place])   . Breast cancer of upper-outer quadrant of left female breast ([place]) 04/21/2015  . GERD (gastroesophageal reflux disease)   . Hypertension     PAST SURGICAL HISTORY: Past Surgical History:  Procedure Laterality Date  . ABDOMINAL HYSTERECTOMY  1990  . MASTECTOMY MODIFIED RADICAL Left 05/09/2015  . MASTECTOMY MODIFIED RADICAL Left 05/09/2015   Procedure: LEFT MODIFIED RADICAL MASTETCTOMY;  Surgeon: Autumn Messing III, MD;  Location: [place];  Service: General;  Laterality: Left;  Marland Kitchen MASTECTOMY W/ SENTINEL NODE BIOPSY Right   . MASTECTOMY W/ SENTINEL NODE BIOPSY Right 05/09/2015   Procedure: RIGHT MASTECTOMY WITH RIGHT AXILLARY SENTINEL LYMPH NODE BIOPSY;  Surgeon: Autumn Messing III, MD;  Location: [place];  Service: General;  Laterality: Right;  . PORTACATH PLACEMENT Right 09/01/2015   Procedure: INSERTION PORT-A-CATH;  Surgeon: Autumn Messing III, MD;  Location: [place];  Service: General;  Laterality: Right;  . TUBAL LIGATION  1984    FAMILY HISTORY Family History  Problem Relation Age of Onset  . Hypertension Mother   . Diabetes Mother     the patient has little information on her father. Her mother is living, currently age 66. She has one brother, 2 sisters.  There is no history of breast or ovarian cancer in the family to her knowledge.   GYNECOLOGIC HISTORY:  No LMP recorded. Patient has had a hysterectomy.  menarche age 4, first live birth age 51. She is GX P4. She underwent a total abdominal hysterectomy with bilateral salpingo-oophorectomy at age 77. She did not take hormone replacement. She never used oral contraceptives.   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 She previously worked the same position at [employer] for 25+ years. She is single and lives alone  The patient's daughter  [name] works as a Programmer, applications, as does her daughter  [name]. Daughter [name] works at [employer], as a call. All 3 live in [place]. The patient's son Marc Morgans Reyburn's lives in [place]. He prepares sets for shows The patient has 4 grandchildren, no great grandchildren. She attends a [employer] in [place].    ADVANCED DIRECTIVES: Not in place. At the initial clinic visit the patient was given the appropriate forms to complete and notarize at her discretion. The patient intends to name her daughter Sampson Si as healthcare power of attorney. She can be reached at [phone].  HEALTH MAINTENANCE: Social History   Tobacco Use  . Smoking status: Former  Smoker    Packs/day: 0.10    Years: 28.00    Pack years: 2.80  . Smokeless tobacco: Never Used  Substance Use Topics  . Alcohol use: No    Alcohol/week: 0.0 oz  . Drug use: No     Colonoscopy: Never  PAP: Status post hysterectomy  Bone density: Never  Lipid panel:  Allergies  Allergen Reactions  . Banana Hives    Tongue itching  . Latex Itching and Other (See Comments)    burning  . 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 Wheat Bran Hives    Current Outpatient Medications  Medication Sig Dispense Refill  . amLODipine (NORVASC) 5 MG tablet Take 1 tablet (5 mg total) by mouth  daily. 90 tablet 6  . anastrozole (ARIMIDEX) 1 MG tablet Take 1 tablet (1 mg total) by mouth daily. 90 tablet 4  . calcium-vitamin D (OSCAL WITH D) 500-200 MG-UNIT tablet Take 1 tablet by mouth 2 (two) times daily. 60 tablet 0  . diphenhydrAMINE (BENADRYL) 25 mg capsule Take 1 capsule (25 mg total) by mouth every 6 (six) hours as needed. 100 capsule 3  . gabapentin (NEURONTIN) 300 MG capsule Take 2 capsules (600 mg total) by mouth 3 (three) times daily. 180 capsule 1  . ibuprofen (ADVIL,MOTRIN) 800 MG tablet TAKE 1 TABLET (800 MG TOTAL) BY MOUTH EVERY 8 HOURS AS NEEDED. 90 tablet 3  . Investigational palbociclib (IBRANCE) 125 MG capsule [facility] AFT-05 PALLAS Take 1 capsule (125 mg total) by mouth daily. Take with food. Swallow whole. Do not chew. Take on days 1-21. Repeat every 28 days. 63 capsule 0  . LORazepam (ATIVAN) 0.5 MG tablet TAKE 1 TABLET BY MOUTH EVERY 8 HOURS 30 tablet 0  . LORazepam (ATIVAN) 0.5 MG tablet TAKE 1 TABLET BY MOUTH EVERY 8 HOURS 30 tablet 0  . losartan-hydrochlorothiazide (HYZAAR) 100-25 MG tablet Take 1 tablet by mouth daily. 90 tablet 3  . metFORMIN (GLUCOPHAGE) 500 MG tablet Take 1 tablet (500 mg total) by mouth 2 (two) times daily with a meal. 60 tablet 2  . pantoprazole (PROTONIX) 40 MG tablet Take 1 tablet (40 mg total) by mouth daily. 30 tablet 3  . potassium chloride (MICRO-K) 10 MEQ CR capsule Take 1 capsule (10 mEq total) by mouth 2 (two) times daily. 90 capsule 4  . traMADol (ULTRAM) 50 MG tablet TAKE 1 TABLET BY MOUTH EVERY 8 HOURS AS NEEDED 60 tablet 0  . traZODone (DESYREL) 50 MG tablet TAKE 1 TABLET BY MOUTH AT BEDTIME. MAY INCREASE TO 2 TABLETS AFTER A FEW DAYS IF NEEDED 60 tablet 1  . traZODone (DESYREL) 50 MG tablet TAKE 1 TABLET BY MOUTH AT BEDTIME. MAY INCREASE TO 2 TABLETS AFTER A FEW DAYS IF NEEDED 60 tablet 1  . valACYclovir (VALTREX) 1000 MG tablet Take 1 tablet (1,000 mg total) by mouth 2 (two) times daily. 90 tablet 4  . venlafaxine XR  (EFFEXOR-XR) 150 MG 24 hr capsule Take 1 capsule (150 mg total) by mouth daily with breakfast. 90 capsule 3   No current facility-administered medications for this visit.       OBJECTIVE: Middle-aged African-American woman who appears stated age 57:   01/13/18 1046  BP: (!) 154/91  Pulse: 87  Resp: 18  Temp: 98.5 F (36.9 C)  SpO2: 99%     Body mass index is 40.34 kg/m.    ECOG FS:2 - Symptomatic, <50% confined to bed  Sclerae unicteric, pupils round and equal Oropharynx clear and moist No cervical or supraclavicular adenopathy Lungs no rales or rhonchi Heart regular rate and rhythm Abd soft, nontender, positive bowel sounds MSK no focal spinal tenderness, minimal left no upper extremity lymphedema Neuro: nonfocal, well oriented, appropriate affect Breasts: That is post bilateral mastectomies.  There is no evidence of local recurrence.  Both axillae are benign.   LAB RESULTS:  CMP     Component Value Date/Time   NA 140 01/08/2018 1106   NA 142 07/24/2017 1407   K 3.4 (L) 01/08/2018 1106   K 3.3 (L) 07/24/2017 1407   CL 103 01/08/2018 1106   CO2 27 01/08/2018 1106   CO2 26 07/24/2017 1407   GLUCOSE 108 01/08/2018 1106   GLUCOSE 110 07/24/2017 1407   BUN 17 01/08/2018 1106   BUN 13.4 07/24/2017 1407   CREATININE 0.88 01/08/2018 1106   CREATININE 1.0 07/24/2017 1407   CALCIUM 10.1 01/08/2018 1106   CALCIUM 9.6 07/24/2017 1407   PROT 8.5 (H) 01/08/2018 1106   PROT 8.5 (H) 07/24/2017 1407   ALBUMIN 4.1 01/08/2018 1106   ALBUMIN 3.9 07/24/2017 1407   AST 20 01/08/2018 1106   AST 23 07/24/2017 1407   ALT 25 01/08/2018 1106   ALT 28 07/24/2017 1407   ALKPHOS 100 01/08/2018 1106   ALKPHOS 84 07/24/2017 1407   BILITOT <0.2 (L) 01/08/2018 1106   BILITOT 0.30 07/24/2017 1407   GFRNONAA >60 01/08/2018 1106   GFRAA >60 01/08/2018 1106    INo results found for: SPEP, UPEP  Lab Results  Component Value Date   WBC 5.8 01/08/2018   NEUTROABS 3.4 01/08/2018     HGB 13.8 01/08/2018   HCT 39.9 01/08/2018   MCV 105.1 (H) 01/08/2018   PLT 217 01/08/2018      Chemistry      Component Value Date/Time   NA 140 01/08/2018 1106   NA 142 07/24/2017 1407   K 3.4 (L) 01/08/2018 1106   K 3.3 (L) 07/24/2017 1407   CL 103 01/08/2018 1106   CO2 27 01/08/2018 1106   CO2 26 07/24/2017 1407   BUN 17 01/08/2018 1106   BUN 13.4 07/24/2017 1407   CREATININE 0.88 01/08/2018 1106   CREATININE 1.0 07/24/2017 1407   GLU 157 (H) 08/17/2016 0818      Component Value Date/Time   CALCIUM 10.1 01/08/2018 1106   CALCIUM 9.6 07/24/2017 1407   ALKPHOS 100 01/08/2018 1106   ALKPHOS 84 07/24/2017 1407   AST 20 01/08/2018 1106   AST 23 07/24/2017 1407   ALT 25 01/08/2018 1106   ALT 28 07/24/2017 1407   BILITOT <0.2 (L) 01/08/2018 1106   BILITOT 0.30 07/24/2017 1407       No results found for: LABCA2  No components found for: LABCA125  No results for input(s): INR in the last 168 hours.  Urinalysis    Component Value Date/Time   COLORURINE YELLOW 06/29/2015 1030   APPEARANCEUR CLOUDY (A) 06/29/2015 1030   LABSPEC 1.019 06/29/2015 1030   PHURINE 7.0 06/29/2015 1030   GLUCOSEU NEGATIVE 06/29/2015 1030   HGBUR TRACE (A) 06/29/2015 1030   BILIRUBINUR NEGATIVE 06/29/2015 1030   KETONESUR NEGATIVE 06/29/2015 1030   PROTEINUR NEGATIVE 06/29/2015 1030   NITRITE NEGATIVE 06/29/2015 1030   LEUKOCYTESUR MODERATE (A) 06/29/2015 1030    STUDIES: No results found.   ASSESSMENT: 57 y.o. [place] woman with synchronous breast cancers, as follows  (a) status post right breast upper outer quadrant  biopsy 04/19/2015 for a clinical pT1c N0, stage IA invasive ductal carcinoma, grade 1, estrogen receptor positive, progesterone receptor negative, with an MIB-1 of 5%, and no HER-2 amplification  (b) status post left breast upper outer quadrant biopsy 2 and left axillary lymph node biopsy 04/19/2015 for a clinical T3 N1, stage IIIA invasive ductal carcinoma, grade  1 or 2, estrogen receptor and progesterone receptor positive, HER-2 negative, with an MIB-1 between 5 and 10%  (1) status post bilateral mastectomies 05/09/2015, showing:   (a) on the right side, a pT2 pN0, stage IIA invasive ductal carcinoma, with negative margins and repeat HER-2 again negative    (b) on the left, a pT3 pN2, stage IIIA invasive ductal carcinoma, grade 2, with negative margins, and repeat HER-2 again negative  (2) Mammaprint from the left-sided tumor returned "luminal type, low risk", predicting a small benefit from chemotherapy with the important caveat that N2 disease was not included in the MINDACT study--patient opted for chemotherapy  (3) Oncotype from the right-sided tumor showed a score of 12, predicting an 8% risk of recurrence outside the breast within the next 10 years, if the patient's only systemic therapy was tamoxifen for 5 years. It also predicted no significant benefit from chemotherapy  (4) postmastectomy radiation to the left chest wall completed 08/15/2015  (5) adjuvant chemotherapy consisting of cyclophosphamide and docetaxel x4 completed on 11/21/15.  (5) anastrozole started 02/08/2016  (6) left upper extremity lymphedema  (7) signed for B-WELL study 02/08/2016  (8) participating in [place] trial as of August 2017  (a) randomized to palbociclib, starting October 2017  PLAN: Margaret [name] is currently a little over 2-1/2 years out from definitive surgery for her breast cancer with no evidence of disease recurrence.  This is very favorable.  She is tolerating anastrozole well and also tolerating the palbociclib without any unusual side effects.  She has the expected mild nausea, mild fatigue, and hot flashes.  She is doing a good job of handling her left upper extremity lymphedema.  I have encouraged her to continue to use her sleeve and to do the drainage procedure every night.  She is going to return to see me in October.  At that time she will complete  her palbociclib treatment and continue on anastrozole alone  The problem in her left upper extremity of course is going to be due to the radiation and surgery that she had there.  The gabapentin should be helpful with that.  I have gone ahead and refilled the appropriate medications for her  She knows to call for any other issues that may develop before the next visit.   Magrinat, Virgie Dad, MD  01/13/18 11:03 AM Medical Oncology and Hematology [facility] [address] [place], [place] [address] Tel. [phone]    Fax. [phone]  Alice Rieger, am acting as scribe for Chauncey Cruel MD.  I, Lurline Del MD, have reviewed the above documentation for accuracy and completeness, and I agree with the above.

## 2018-01-13 ENCOUNTER — Inpatient Hospital Stay (HOSPITAL_BASED_OUTPATIENT_CLINIC_OR_DEPARTMENT_OTHER): Payer: Medicare Other | Admitting: Oncology

## 2018-01-13 ENCOUNTER — Inpatient Hospital Stay: Payer: Medicare Other

## 2018-01-13 ENCOUNTER — Inpatient Hospital Stay: Payer: Medicare Other | Admitting: *Deleted

## 2018-01-13 VITALS — BP 154/91 | HR 87 | Temp 98.5°F | Resp 18 | Ht 69.0 in | Wt 273.2 lb

## 2018-01-13 VITALS — Ht 67.72 in | Wt 272.2 lb

## 2018-01-13 DIAGNOSIS — Z9221 Personal history of antineoplastic chemotherapy: Secondary | ICD-10-CM

## 2018-01-13 DIAGNOSIS — Z170 Estrogen receptor positive status [ER+]: Principal | ICD-10-CM

## 2018-01-13 DIAGNOSIS — Z923 Personal history of irradiation: Secondary | ICD-10-CM | POA: Diagnosis not present

## 2018-01-13 DIAGNOSIS — C773 Secondary and unspecified malignant neoplasm of axilla and upper limb lymph nodes: Secondary | ICD-10-CM | POA: Diagnosis not present

## 2018-01-13 DIAGNOSIS — C50412 Malignant neoplasm of upper-outer quadrant of left female breast: Secondary | ICD-10-CM

## 2018-01-13 DIAGNOSIS — C50211 Malignant neoplasm of upper-inner quadrant of right female breast: Secondary | ICD-10-CM

## 2018-01-13 DIAGNOSIS — I890 Lymphedema, not elsewhere classified: Secondary | ICD-10-CM | POA: Diagnosis not present

## 2018-01-13 DIAGNOSIS — C50011 Malignant neoplasm of nipple and areola, right female breast: Secondary | ICD-10-CM

## 2018-01-13 DIAGNOSIS — C50411 Malignant neoplasm of upper-outer quadrant of right female breast: Secondary | ICD-10-CM

## 2018-01-13 DIAGNOSIS — C50012 Malignant neoplasm of nipple and areola, left female breast: Secondary | ICD-10-CM

## 2018-01-13 DIAGNOSIS — Z006 Encounter for examination for normal comparison and control in clinical research program: Secondary | ICD-10-CM

## 2018-01-13 DIAGNOSIS — Z9013 Acquired absence of bilateral breasts and nipples: Secondary | ICD-10-CM | POA: Diagnosis not present

## 2018-01-13 DIAGNOSIS — Z79811 Long term (current) use of aromatase inhibitors: Secondary | ICD-10-CM

## 2018-01-13 DIAGNOSIS — G629 Polyneuropathy, unspecified: Secondary | ICD-10-CM

## 2018-01-13 LAB — RESEARCH LABS

## 2018-01-13 LAB — GLUCOSE, RANDOM: Glucose, Bld: 118 mg/dL (ref 70–140)

## 2018-01-13 MED ORDER — ANASTROZOLE 1 MG PO TABS: 1.0000 mg | ORAL_TABLET | Freq: Every day | ORAL | 4 refills | Status: DC

## 2018-01-13 MED ORDER — TRAMADOL HCL 50 MG PO TABS: 50.0000 mg | ORAL_TABLET | Freq: Three times a day (TID) | ORAL | 0 refills | Status: DC | PRN

## 2018-01-13 MED ORDER — VENLAFAXINE HCL ER 150 MG PO CP24: 150.0000 mg | ORAL_CAPSULE | Freq: Every day | ORAL | 3 refills | Status: DC

## 2018-01-13 MED ORDER — INV-PALBOCICLIB 125 MG CAPS #23 ALLIANCE FOUNDATION AFT-05 (PALLAS): 125.0000 mg | ORAL_CAPSULE | Freq: Every day | ORAL | 0 refills | Status: DC

## 2018-01-13 MED ORDER — GABAPENTIN 300 MG PO CAPS: 600.0000 mg | ORAL_CAPSULE | Freq: Three times a day (TID) | ORAL | 1 refills | Status: DC

## 2018-01-13 MED FILL — GABAPENTIN 300 MG CAPSULE: 300 | 30 days supply | Qty: 180 | Fill #0

## 2018-01-13 MED FILL — VENLAFAXINE HCL ER 150 MG C: 150 | 30 days supply | Qty: 30 | Fill #3

## 2018-01-13 NOTE — Progress Notes (Addendum)
01/13/2018 Research - [facility] AFT-05 PALLAS Cycle 24 Upon arrival to the clinic, PRO and adherence questionnaires were completed independently by the patient. Patient proceeded to lab for collection of blood samples for the BWEL Month 24 visit; note that samples for the PALLAS study, routine [facility] hematology and chemistry tests, were conducted last week, within the 7 day window allowed per protocol. Patient returned her empty palbociclib bottles for Cycles 18-20, and these were returned to pharmacist, Raul Del, for proper accountability. Completed palbociclib and anti-hormone therapy diaries for cycles 18-20 were returned by the patient today. Based on lab results review and history and physical by Dr. Jana Hakim, patient condition is acceptable for continued treatment. Patient was given copies of diaries for both medications for Cycles 18-20, along with three bottles of study medication for the next three cycles. Reviewed study medication and anti-hormone therapy instructions with patient; she will continue daily dosing with anastrozole, and she will begin the next cycle of dosing with palbociclib today.   [facility] [id number] Goff 24 visit Upon arrival to clinic, patient had been fasting since 11:00pm yesterday. Random glucose sample was collected along with Month 24 research blood samples, including optional samples for the [id number] substudy. Follow-up questionnaire was completed independently by the patient. Patient denies the occurrence of any fractures, sprains, tendon/ligament injuries or orthopedic surgeries in the past six months since the study Month 18 visit. Weight and waist and hip circumference measurements were obtained per protocol, and following the instructions in the BWEL Weight and Height Protocol document. Patient reports receiving her final study mailing, and is aware that this concludes the end of the 2 year Health Education intervention. However, patient will continue in the  follow-up phase including ongoing measurements to occur six and twelve months from now. Thanked patient for her study participation.  Cindy S. Brigitte Pulse BSN, RN, CCRP 01/13/2018 4:50 PM  Adverse Event Log PALLAS Cycles 21-23: 10/21/2017 - 01/13/2018 Event Grade Onset Date Resolved Date Attribution to palbociclib Attribution to anastrozole Treatment Comments  Dry skin Grade 1 04/04/2016 ongoing No Yes    Itching Grade 1 04/04/2016 ongoing No No    Hypertension, intermittent Grade 3 05/30/2016 ongoing No No Hyzaar, amlodipine   Depression Grade 2 07/17/2016 ongoing No No    Decreased concentration Grade 1 07/17/2016 ongoing Yes Yes    Vaginal dryness Grade 2 09/2016 01/08/2018 No Yes Coconut oil   Peripheral neuropathy, int Grade 2 11/19/2016 ongoing No No    Insomnia Grade 2 12/20/16 ongoing No No Trazodone   Allergic rhinitis Grade 2 6/UN/2018 ongoing No No Benadryl   Type 2 DM Grade 2 03/28/2017 ongoing No No Metformin   Hypokalemia Grade 1 03/28/2017 ongoing No No  Supplement  Weight loss Grade 1 07/24/2017 ongoing No No    Fatigue Grade 2 09/23/2017 ongoing Yes Yes    Generalized weakness Grade 2 09/23/2017 ongoing No No    Clammy skin Grade 1 09/23/2017 ongoing No No    Lightheadedness Grade 1 09/23/2017 ongoing No No    Skin burning sensation Grade 1 09/23/2017 ongoing No No    Lateral leg pain Grade 2 10/21/2017 ongoing No No    Increased creatinine Grade 1 10/21/2017 01/08/2018 No No    Increased AST Grade 1 10/21/2017 01/08/2018 Yes No    Dehydration Grade 1 10/21/2017 01/08/2018 No No    Nausea, intermittent Grade 1 12/04/2017 ongoing Yes No    Tearing, increased Grade 1 10/04/2017 ongoing No No    Additional out of  range hematology and chemistry lab results were not considered clinically significant. Cindy S. Brigitte Pulse BSN, RN, [place] 02/10/2018 4:58 PM

## 2018-01-28 ENCOUNTER — Telehealth: Payer: Self-pay

## 2018-01-28 ENCOUNTER — Other Ambulatory Visit: Payer: Self-pay

## 2018-01-28 NOTE — Telephone Encounter (Signed)
Pt called requesting her medical compression sleeve script to be faxed to [facility] in [place] today. Faxed requested script to [phone]. Pt appreciative of time and call.

## 2018-02-19 ENCOUNTER — Other Ambulatory Visit: Payer: Self-pay | Admitting: Oncology

## 2018-02-19 ENCOUNTER — Other Ambulatory Visit: Payer: Self-pay | Admitting: Adult Health

## 2018-02-19 ENCOUNTER — Other Ambulatory Visit: Payer: Self-pay | Admitting: Nurse Practitioner

## 2018-02-19 DIAGNOSIS — E119 Type 2 diabetes mellitus without complications: Secondary | ICD-10-CM

## 2018-02-19 DIAGNOSIS — G4720 Circadian rhythm sleep disorder, unspecified type: Secondary | ICD-10-CM

## 2018-02-19 MED FILL — metFORMIN HCL 500 MG TABS: 500 | 30 days supply | Qty: 60 | Fill #0

## 2018-02-19 MED FILL — ANASTROZOLE 1 MG TABLET: 1 | 90 days supply | Qty: 90 | Fill #2

## 2018-02-19 MED FILL — GABAPENTIN 300 MG CAPSULE: 300 | 30 days supply | Qty: 180 | Fill #1

## 2018-02-19 MED FILL — POTASSIUM CL ER 10 MEQ CAP: 10 | 30 days supply | Qty: 60 | Fill #1

## 2018-02-19 MED FILL — IBUPROFEN 800 MG TAB: 800 | 30 days supply | Qty: 90 | Fill #3

## 2018-02-19 MED FILL — PANTOPRAZOLE SOD DR 40 MG T: 40 | 30 days supply | Qty: 30 | Fill #3

## 2018-02-20 ENCOUNTER — Other Ambulatory Visit: Payer: Self-pay | Admitting: *Deleted

## 2018-02-20 MED FILL — traZODone HCL 50 MG TABS: 50 | 30 days supply | Qty: 60 | Fill #0

## 2018-02-20 MED FILL — VENLAFAXINE HCL ER 150 MG C: 150 | 90 days supply | Qty: 90 | Fill #0

## 2018-02-20 MED FILL — LOSARTAN-HCTZ 100-25 MG TAB: 100-25 | 90 days supply | Qty: 90 | Fill #0

## 2018-03-11 ENCOUNTER — Telehealth: Payer: Self-pay

## 2018-03-11 NOTE — Telephone Encounter (Signed)
Follow up regarding letter from [facility] stating that prescription for Losartan/HCTZ is nonformulary and not covered under her plan.  Per representative with [employer], medication is formulary and problem arose d/t how [facility] filed the claim.  [employer] rep stated when prescription was filled in July the pharmacy filed the claim for "90 tabs for 1 day". [facility] contacted and documentation on their end corrected. A fax has been received from [facility] stating that this medication is in the formulary and will not require prior authorization in the future. A copy of this fax will be mailed to [place] and a copy sent to HIM to be scanned in to pt's chart.

## 2018-04-04 ENCOUNTER — Other Ambulatory Visit: Payer: Self-pay | Admitting: Oncology

## 2018-04-04 DIAGNOSIS — C50012 Malignant neoplasm of nipple and areola, left female breast: Secondary | ICD-10-CM

## 2018-04-04 DIAGNOSIS — C50412 Malignant neoplasm of upper-outer quadrant of left female breast: Secondary | ICD-10-CM

## 2018-04-04 DIAGNOSIS — G629 Polyneuropathy, unspecified: Secondary | ICD-10-CM

## 2018-04-04 DIAGNOSIS — C50211 Malignant neoplasm of upper-inner quadrant of right female breast: Secondary | ICD-10-CM

## 2018-04-04 DIAGNOSIS — Z170 Estrogen receptor positive status [ER+]: Principal | ICD-10-CM

## 2018-04-04 DIAGNOSIS — C50011 Malignant neoplasm of nipple and areola, right female breast: Secondary | ICD-10-CM

## 2018-04-04 MED FILL — GABAPENTIN 300 MG CAPSULE: 300 | 30 days supply | Qty: 180 | Fill #0

## 2018-04-04 MED FILL — IBUPROFEN 800 MG TAB: 800 | 30 days supply | Qty: 90 | Fill #0

## 2018-04-04 MED FILL — AMLODIPINE BESYLATE 5 MG TA: 5 | 90 days supply | Qty: 90 | Fill #2

## 2018-04-04 MED FILL — metFORMIN HCL 500 MG TABS: 500 | 30 days supply | Qty: 60 | Fill #1

## 2018-04-04 MED FILL — PANTOPRAZOLE SOD DR 40 MG T: 40 | 30 days supply | Qty: 30 | Fill #0

## 2018-04-04 MED FILL — traZODone HCL 50 MG TABS: 50 | 30 days supply | Qty: 60 | Fill #1

## 2018-04-04 MED FILL — traMADol HCL 50 MG TABS: 50 | 20 days supply | Qty: 60 | Fill #0

## 2018-04-04 MED FILL — valACYclovir HCL 1 GM TABS: 1 | 45 days supply | Qty: 90 | Fill #0

## 2018-04-04 MED FILL — POTASSIUM CL ER 10 MEQ CAP: 10 | 30 days supply | Qty: 60 | Fill #2

## 2018-05-07 ENCOUNTER — Telehealth: Payer: Self-pay | Admitting: *Deleted

## 2018-05-07 NOTE — Progress Notes (Signed)
[place]  [phone] [phone]   ID: GEAN LAURSEN DOB: [birth year]  MR#: [id number]  IWL#:[id number]  Patient Care Team: Hoyt Koch, MD as PCP - General (Internal Medicine) Jovita Kussmaul, MD as Consulting Physician (General Surgery) Magrinat, Virgie Dad, MD as Consulting Physician (Oncology) Gery Pray, MD as Consulting Physician (Radiation Oncology) Mauro Kaufmann, RN as Registered Nurse Rockwell Germany, RN as Registered Nurse Benson Norway, RN as Registered Nurse Rosemarie Ax, MD as Consulting Physician (Family Medicine) PCP: Hoyt Koch, MD GYN: OTHER MD:  CHIEF COMPLAINT:  estrogen receptor positive breast cancer; bilateral   CURRENT TREATMENT: anastrozole  BREAST CANCER HISTORY: From the original intake note:  Margaret [name]'s primary care physician retired sometime ago and her health maintenance has not been up-to-date. She has been receiving medical care through the emergency room and was seen in March 2015 following a head injury, then in August 2015 for lancing of an abscess. In December 2015 she noticed that her left breast looked a little bit smaller than her right. She brought this to the attention of friends and family over the next several months but the general feeling was that most people are not perfectly symmetrical. By the summer of this year she noticed that her nipple on the left was "going in". More recently she started developing pain in the left breast. She was evaluated for this in the emergency room 04/09/2015 at which time a left breast exam showed a deformed left breast with a large hard mass encompassing most of the breast, with nipple retraction. There was no nipple discharge or bleeding and no palpable adenopathy.  The patient was referred to [facility] and on 04/13/2015 she underwent bilateral diagnostic mammography with tomosynthesis as well as bilateral breast ultrasound. The breast density was  category B. In the right breast at the 11:00 position there was a 1.3 cm irregular mass which by ultrasonography measured 1.3 cm.--In the left breast there was a 4 cm irregular mass at the 2:00 position associated with nipple retraction. There was a second, 1.7 cm area of architectural distortion at the 9:00 position. Both were palpable. By ultrasonography, the 4 cm irregular mass was noted, with a second mass measuring 2.5 cm by ultrasonography. Both axillae were benign.  On 04/19/2015 the patient underwent right breast upper outer quadrant biopsy, showing ([id number]) and invasive ductal carcinoma, grade 1, estrogen receptor 90% positive, progesterone receptor negative, with an MIB-1 of 5%, and no HER-2 amplification, the signals ratio being 1.26 and the number per cell 2.20.  On the same day, she underwent biopsy of the 2 left breast masses in question as well as a suspicious left axillary lymph node. All 3 biopsies showed invasive ductal carcinoma, grade 2, estrogen receptor 80-100% positive, progesterone receptor 80-100% positive, with the MIB-1 between 5 and 10%, and no HER-2 amplification, the signals ratio being between 1.05 and 1.13, and the number per cell between 2.10 and 2.25.  The patient's subsequent history is as detailed below  INTERVAL HISTORY: Margaret [name] returns today for follow-up and treatment of her estrogen receptor positive breast cancer.  She continues on anastrozole, with good tolerance. She denies issues with hot flashes or vaginal dryness.   She completed her PALLAS trial of palbociclib. She tolerated this medication well.  She required no dose reductions.  She has not had any side effects since completing the medication.    REVIEW OF SYSTEMS:  Cameran reports that her hair is growing  back white, and she is not ready to show it off. She has right hip and leg pain. She has increased anxiety and racing thoughts. She has constant eye watering that is getting worse. Sometimes she  feels pulling from her left mastectomy scar. She continues taking venlafaxine for numbness in her toes. She denies unusual headaches, visual changes, nausea, vomiting, or dizziness. There has been no unusual cough, phlegm production, or pleurisy. There has been no change in bowel or bladder habits. She denies unexplained fatigue or unexplained weight loss, bleeding, rash, or fever. A detailed review of systems was otherwise stable.    PAST MEDICAL HISTORY: Past Medical History:  Diagnosis Date  . Bilateral breast cancer ([place])   . Breast cancer ([place])   . Breast cancer of upper-outer quadrant of left female breast ([place]) 04/21/2015  . GERD (gastroesophageal reflux disease)   . Hypertension     PAST SURGICAL HISTORY: Past Surgical History:  Procedure Laterality Date  . ABDOMINAL HYSTERECTOMY  1990  . MASTECTOMY MODIFIED RADICAL Left 05/09/2015  . MASTECTOMY MODIFIED RADICAL Left 05/09/2015   Procedure: LEFT MODIFIED RADICAL MASTETCTOMY;  Surgeon: Autumn Messing III, MD;  Location: [place];  Service: General;  Laterality: Left;  Marland Kitchen MASTECTOMY W/ SENTINEL NODE BIOPSY Right   . MASTECTOMY W/ SENTINEL NODE BIOPSY Right 05/09/2015   Procedure: RIGHT MASTECTOMY WITH RIGHT AXILLARY SENTINEL LYMPH NODE BIOPSY;  Surgeon: Autumn Messing III, MD;  Location: [place];  Service: General;  Laterality: Right;  . PORTACATH PLACEMENT Right 09/01/2015   Procedure: INSERTION PORT-A-CATH;  Surgeon: Autumn Messing III, MD;  Location: [place];  Service: General;  Laterality: Right;  . TUBAL LIGATION  1984    FAMILY HISTORY Family History  Problem Relation Age of Onset  . Hypertension Mother   . Diabetes Mother     the patient has little information on her father. Her mother is living, currently age 78. She has one brother, 2 sisters. There is no history of breast or ovarian cancer in the family to her knowledge.   GYNECOLOGIC HISTORY:  No LMP recorded. Patient has had a hysterectomy.  menarche age 57, first  live birth age 57. She is GX P4. She underwent a total abdominal hysterectomy with bilateral salpingo-oophorectomy at age 28. She did not take hormone replacement. She never used oral contraceptives.   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 She previously worked the same position at [employer] for 25+ years. She is single and lives alone  The patient's daughter  [name] works as a Programmer, applications, as does her daughter  [name]. Daughter [name] works at [employer], as a call. All 3 live in [place]. The patient's son Marc Morgans Schoeneck's lives in [place]. He prepares sets for shows The patient has 4 grandchildren, no great grandchildren. She attends a [employer] in [place].    ADVANCED DIRECTIVES: Not in place. At the initial clinic visit the patient was given the appropriate forms to complete and notarize at her discretion. The patient intends to name her daughter Sampson Si as healthcare power of attorney. She can be reached at [phone].  HEALTH MAINTENANCE: Social History   Tobacco Use  . Smoking status: Former Smoker    Packs/day: 0.10    Years: 28.00    Pack years: 2.80  . Smokeless tobacco: Never Used  Substance Use Topics  . Alcohol use: No    Alcohol/week: 0.0 standard drinks  .  Drug use: No     Colonoscopy: Never  PAP: Status post hysterectomy  Bone density: Never  Lipid panel:  Allergies  Allergen Reactions  . Banana Hives    Tongue itching  . Latex Itching and Other (See Comments)    burning  . 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 Wheat Bran Hives    Current Outpatient Medications  Medication Sig Dispense Refill  . amLODipine (NORVASC) 5 MG tablet Take 1 tablet (5 mg total) by mouth daily. 90 tablet 6  . anastrozole (ARIMIDEX) 1 MG tablet Take 1 tablet (1 mg total) by mouth daily. 90 tablet 4  . calcium-vitamin D (OSCAL WITH D) 500-200 MG-UNIT  tablet Take 1 tablet by mouth 2 (two) times daily. 60 tablet 0  . diphenhydrAMINE (BENADRYL) 25 mg capsule Take 1 capsule (25 mg total) by mouth every 6 (six) hours as needed. 100 capsule 3  . gabapentin (NEURONTIN) 300 MG capsule TAKE 2 CAPSULES (600 MG TOTAL) BY MOUTH 3 TIMES DAILY. 180 capsule 1  . ibuprofen (ADVIL,MOTRIN) 800 MG tablet TAKE 1 TABLET (800 MG TOTAL) BY MOUTH EVERY 8 HOURS AS NEEDED. 90 tablet 3  . Investigational palbociclib (IBRANCE) 125 MG capsule [facility] AFT-05 PALLAS Take 1 capsule (125 mg total) by mouth daily. Take with food. Swallow whole. Do not chew. Take on days 1-21. Repeat every 28 days. 63 capsule 0  . losartan-hydrochlorothiazide (HYZAAR) 100-25 MG tablet TAKE 1 TABLET BY MOUTH DAILY. 90 tablet 3  . metFORMIN (GLUCOPHAGE) 500 MG tablet TAKE 1 TABLET BY MOUTH 2 TIMES DAILY WITH A MEAL. 60 tablet 2  . pantoprazole (PROTONIX) 40 MG tablet TAKE 1 TABLET BY MOUTH ONCE DAILY 30 tablet 3  . potassium chloride (MICRO-K) 10 MEQ CR capsule Take 1 capsule (10 mEq total) by mouth 2 (two) times daily. 90 capsule 4  . traMADol (ULTRAM) 50 MG tablet Take 1 tablet (50 mg total) by mouth every 8 (eight) hours as needed. 60 tablet 0  . traMADol (ULTRAM) 50 MG tablet TAKE 1 TABLET BY MOUTH EVERY 8 HOURS AS NEEDED 60 tablet 0  . traZODone (DESYREL) 50 MG tablet TAKE 1 TABLET BY MOUTH AT BEDTIME. MAY INCREASE TO 2 TABLETS AFTER A FEW DAYS IF NEEDED 60 tablet 1  . valACYclovir (VALTREX) 1000 MG tablet Take 1 tablet (1,000 mg total) by mouth 2 (two) times daily. 90 tablet 4  . valACYclovir (VALTREX) 1000 MG tablet TAKE 1 TABLET BY MOUTH 2 TIMES DAILY. 90 tablet 4  . venlafaxine XR (EFFEXOR-XR) 150 MG 24 hr capsule Take 1 capsule (150 mg total) by mouth daily with breakfast. 90 capsule 3   No current facility-administered medications for this visit.       OBJECTIVE: Morbidly obese African-American woman in no acute distress Vitals:   05/08/18 1349  BP: (!) 166/106  Pulse:  90  Resp: 19  Temp: 98.8 F (37.1 C)  SpO2: 97%     Body mass index is 42.02 kg/m.    ECOG FS:1 - Symptomatic but completely ambulatory    Sclerae unicteric, EOMs intact No cervical or supraclavicular adenopathy Lungs no rales or rhonchi Heart regular rate and rhythm Abd soft, nontender, positive bowel sounds MSK no focal spinal tenderness, no upper extremity lymphedema Neuro: nonfocal, well oriented, appropriate affect Breasts: Status post bilateral mastectomies.  No evidence of chest wall recurrence.  Both axillae are benign.  LAB RESULTS:  CMP  Component Value Date/Time   NA 140 01/08/2018 1106   NA 142 07/24/2017 1407   K 3.4 (L) 01/08/2018 1106   K 3.3 (L) 07/24/2017 1407   CL 103 01/08/2018 1106   CO2 27 01/08/2018 1106   CO2 26 07/24/2017 1407   GLUCOSE 118 01/13/2018 1032   GLUCOSE 110 07/24/2017 1407   BUN 17 01/08/2018 1106   BUN 13.4 07/24/2017 1407   CREATININE 0.88 01/08/2018 1106   CREATININE 1.0 07/24/2017 1407   CALCIUM 10.1 01/08/2018 1106   CALCIUM 9.6 07/24/2017 1407   PROT 8.5 (H) 01/08/2018 1106   PROT 8.5 (H) 07/24/2017 1407   ALBUMIN 4.1 01/08/2018 1106   ALBUMIN 3.9 07/24/2017 1407   AST 20 01/08/2018 1106   AST 23 07/24/2017 1407   ALT 25 01/08/2018 1106   ALT 28 07/24/2017 1407   ALKPHOS 100 01/08/2018 1106   ALKPHOS 84 07/24/2017 1407   BILITOT <0.2 (L) 01/08/2018 1106   BILITOT 0.30 07/24/2017 1407   GFRNONAA >60 01/08/2018 1106   GFRAA >60 01/08/2018 1106    INo results found for: SPEP, UPEP  Lab Results  Component Value Date   WBC 10.1 05/08/2018   NEUTROABS 6.3 05/08/2018   HGB 13.7 05/08/2018   HCT 40.2 05/08/2018   MCV 101.1 (H) 05/08/2018   PLT 198 05/08/2018      Chemistry      Component Value Date/Time   NA 140 01/08/2018 1106   NA 142 07/24/2017 1407   K 3.4 (L) 01/08/2018 1106   K 3.3 (L) 07/24/2017 1407   CL 103 01/08/2018 1106   CO2 27 01/08/2018 1106   CO2 26 07/24/2017 1407   BUN 17 01/08/2018  1106   BUN 13.4 07/24/2017 1407   CREATININE 0.88 01/08/2018 1106   CREATININE 1.0 07/24/2017 1407   GLU 157 (H) 08/17/2016 0818      Component Value Date/Time   CALCIUM 10.1 01/08/2018 1106   CALCIUM 9.6 07/24/2017 1407   ALKPHOS 100 01/08/2018 1106   ALKPHOS 84 07/24/2017 1407   AST 20 01/08/2018 1106   AST 23 07/24/2017 1407   ALT 25 01/08/2018 1106   ALT 28 07/24/2017 1407   BILITOT <0.2 (L) 01/08/2018 1106   BILITOT 0.30 07/24/2017 1407       No results found for: LABCA2  No components found for: LABCA125  No results for input(s): INR in the last 168 hours.  Urinalysis    Component Value Date/Time   COLORURINE YELLOW 06/29/2015 1030   APPEARANCEUR CLOUDY (A) 06/29/2015 1030   LABSPEC 1.019 06/29/2015 1030   PHURINE 7.0 06/29/2015 1030   GLUCOSEU NEGATIVE 06/29/2015 1030   HGBUR TRACE (A) 06/29/2015 1030   BILIRUBINUR NEGATIVE 06/29/2015 1030   KETONESUR NEGATIVE 06/29/2015 1030   PROTEINUR NEGATIVE 06/29/2015 1030   NITRITE NEGATIVE 06/29/2015 1030   LEUKOCYTESUR MODERATE (A) 06/29/2015 1030    STUDIES: No results found.   ASSESSMENT: 57 y.o. [place] woman with synchronous breast cancers, as follows  (a) status post right breast upper outer quadrant biopsy 04/19/2015 for a clinical pT1c N0, stage IA invasive ductal carcinoma, grade 1, estrogen receptor positive, progesterone receptor negative, with an MIB-1 of 5%, and no HER-2 amplification  (b) status post left breast upper outer quadrant biopsy 2 and left axillary lymph node biopsy 04/19/2015 for a clinical T3 N1, stage IIIA invasive ductal carcinoma, grade 1 or 2, estrogen receptor and progesterone receptor positive, HER-2 negative, with an MIB-1 between 5 and 10%  (1)  status post bilateral mastectomies 05/09/2015, showing:   (a) on the right side, a pT2 pN0, stage IIA invasive ductal carcinoma, with negative margins and repeat HER-2 again negative    (b) on the left, a pT3 pN2, stage IIIA invasive  ductal carcinoma, grade 2, with negative margins, and repeat HER-2 again negative  (2) Mammaprint from the left-sided tumor returned "luminal type, low risk", predicting a small benefit from chemotherapy with the important caveat that N2 disease was not included in the MINDACT study--patient opted for chemotherapy  (3) Oncotype from the right-sided tumor showed a score of 12, predicting an 8% risk of recurrence outside the breast within the next 10 years, if the patient's only systemic therapy was tamoxifen for 5 years. It also predicted no significant benefit from chemotherapy  (4) postmastectomy radiation to the left chest wall completed 08/15/2015  (5) adjuvant chemotherapy consisting of cyclophosphamide and docetaxel x4 completed on 11/21/15.  (5) anastrozole started 02/08/2016  (6) left upper extremity lymphedema  (7) signed for B-WELL study 02/08/2016  (8) participating in [place] trial as of August 2017  (a) randomized to palbociclib, starting October 2017  (b) completed 2 years of palbociclib October 2019, with no dose reductions necessary  PLAN: Dacey is currently 3 years out from definitive surgery for breast cancer with no evidence of disease recurrence.  This is very favorable.  She is tolerating anastrozole well and the plan is to continue that a minimum of 5 years.  She did terrific with the palbociclib.  She feels a little bit better now that she is off that medication.  She is having significant pain in the left hip area.  This is likely arthritis.  Nevertheless we are going to obtain plain films today just to make sure nothing else is going on.  She continues to have significant and epiphora.  I think she needs ophthalmology referral for this.  We will try to arrange that for her as soon as possible.  Otherwise she will return to see me in January.  She knows to call for any other issues that may develop before the next visit.  Magrinat, Virgie Dad, MD  05/08/18 2:07  PM Medical Oncology and Hematology [facility] [address] [place], [place] [address] Tel. [phone]    Fax. [phone]  Alice Rieger, am acting as scribe for Chauncey Cruel MD.  I, Lurline Del MD, have reviewed the above documentation for accuracy and completeness, and I agree with the above.

## 2018-05-08 ENCOUNTER — Inpatient Hospital Stay: Payer: Medicare Other

## 2018-05-08 ENCOUNTER — Ambulatory Visit (HOSPITAL_COMMUNITY)
Admission: RE | Admit: 2018-05-08 | Discharge: 2018-05-08 | Disposition: A | Discharge status: Home / Self Care | Payer: Medicare Other | Source: Ambulatory Visit | Attending: Oncology | Admitting: Oncology

## 2018-05-08 ENCOUNTER — Inpatient Hospital Stay: Payer: Medicare Other | Admitting: *Deleted

## 2018-05-08 ENCOUNTER — Inpatient Hospital Stay: Payer: Medicare Other | Attending: Oncology | Admitting: Oncology

## 2018-05-08 ENCOUNTER — Telehealth: Payer: Self-pay | Admitting: Oncology

## 2018-05-08 ENCOUNTER — Ambulatory Visit: Payer: Medicare Other

## 2018-05-08 VITALS — BP 166/106 | HR 90 | Temp 98.8°F | Resp 19 | Ht 67.72 in | Wt 274.1 lb

## 2018-05-08 DIAGNOSIS — M25552 Pain in left hip: Secondary | ICD-10-CM | POA: Diagnosis not present

## 2018-05-08 DIAGNOSIS — Z87891 Personal history of nicotine dependence: Secondary | ICD-10-CM | POA: Insufficient documentation

## 2018-05-08 DIAGNOSIS — Z170 Estrogen receptor positive status [ER+]: Principal | ICD-10-CM

## 2018-05-08 DIAGNOSIS — Z9013 Acquired absence of bilateral breasts and nipples: Secondary | ICD-10-CM | POA: Diagnosis not present

## 2018-05-08 DIAGNOSIS — C9012 Plasma cell leukemia in relapse: Secondary | ICD-10-CM | POA: Diagnosis not present

## 2018-05-08 DIAGNOSIS — C50912 Malignant neoplasm of unspecified site of left female breast: Secondary | ICD-10-CM | POA: Diagnosis not present

## 2018-05-08 DIAGNOSIS — Z923 Personal history of irradiation: Secondary | ICD-10-CM | POA: Diagnosis not present

## 2018-05-08 DIAGNOSIS — Z9221 Personal history of antineoplastic chemotherapy: Secondary | ICD-10-CM | POA: Insufficient documentation

## 2018-05-08 DIAGNOSIS — C50411 Malignant neoplasm of upper-outer quadrant of right female breast: Secondary | ICD-10-CM

## 2018-05-08 DIAGNOSIS — Z79811 Long term (current) use of aromatase inhibitors: Secondary | ICD-10-CM | POA: Diagnosis not present

## 2018-05-08 DIAGNOSIS — H04209 Unspecified epiphora, unspecified lacrimal gland: Secondary | ICD-10-CM

## 2018-05-08 DIAGNOSIS — C50412 Malignant neoplasm of upper-outer quadrant of left female breast: Secondary | ICD-10-CM

## 2018-05-08 DIAGNOSIS — Z006 Encounter for examination for normal comparison and control in clinical research program: Secondary | ICD-10-CM | POA: Diagnosis not present

## 2018-05-08 DIAGNOSIS — C773 Secondary and unspecified malignant neoplasm of axilla and upper limb lymph nodes: Secondary | ICD-10-CM | POA: Insufficient documentation

## 2018-05-08 LAB — CBC WITH DIFFERENTIAL (CANCER CENTER ONLY)
Basophils Absolute: 0.1 10*3/uL (ref 0.0–0.1)
Basophils Relative: 2 %
Eosinophils Absolute: 0.3 10*3/uL (ref 0.0–0.5)
Eosinophils Relative: 3 %
HCT: 40.2 % (ref 34.8–46.6)
Hemoglobin: 13.7 g/dL (ref 11.6–15.9)
Lymphocytes Relative: 23 %
Lymphs Abs: 2.3 10*3/uL (ref 0.9–3.3)
MCH: 34.6 pg — ABNORMAL HIGH (ref 25.1–34.0)
MCHC: 34.2 g/dL (ref 31.5–36.0)
MCV: 101.1 fL — ABNORMAL HIGH (ref 79.5–101.0)
Monocytes Absolute: 1.1 10*3/uL — ABNORMAL HIGH (ref 0.1–0.9)
Monocytes Relative: 11 %
Neutro Abs: 6.3 10*3/uL (ref 1.5–6.5)
Neutrophils Relative %: 61 %
Platelet Count: 198 10*3/uL (ref 145–400)
RBC: 3.97 MIL/uL (ref 3.70–5.45)
RDW: 13.2 % (ref 11.2–14.5)
WBC Count: 10.1 10*3/uL (ref 3.9–10.3)

## 2018-05-08 LAB — CMP (CANCER CENTER ONLY)
ALT: 31 U/L (ref 0–44)
AST: 18 U/L (ref 15–41)
Albumin: 3.7 g/dL (ref 3.5–5.0)
Alkaline Phosphatase: 117 U/L (ref 38–126)
Anion gap: 8 (ref 5–15)
BUN: 16 mg/dL (ref 6–20)
CO2: 30 mmol/L (ref 22–32)
Calcium: 9.9 mg/dL (ref 8.9–10.3)
Chloride: 105 mmol/L (ref 98–111)
Creatinine: 0.86 mg/dL (ref 0.44–1.00)
GFR, Est AFR Am: >60 mL/min (ref >60)
GFR, Estimated: >60 mL/min (ref >60)
Glucose, Bld: 161 mg/dL — ABNORMAL HIGH (ref 70–99)
Potassium: 3.2 mmol/L — ABNORMAL LOW (ref 3.5–5.1)
Sodium: 143 mmol/L (ref 135–145)
Total Bilirubin: 0.2 mg/dL — ABNORMAL LOW (ref 0.3–1.2)
Total Protein: 8.2 g/dL — ABNORMAL HIGH (ref 6.5–8.1)

## 2018-05-08 LAB — RESEARCH LABS

## 2018-05-08 NOTE — Telephone Encounter (Signed)
Gave avs and calendar ° °

## 2018-05-08 NOTE — Research (Signed)
05/08/2018 AFT-05 PALLAS - End of Treatment visit Patient in to clinic unaccompanied today for End of Treatment visit (30-42 days after end of IP) for the AFT-05 PALLAS study.  Research nurse met with patient to review all changes associated with the Information and Consent Form, version 6.1, approved by [employer] on 02/12/2018. All of the patient's questions were answered and she agreed to continued participation in the research study. No changes were made to her original responses to questions #1 and #2 on page 22 of the current consent form. The ICF was signed and dated by the patient; a blank copy of the consent form was given to the patient for reference, and a copy of the signed form will be mailed to her.   Research blood samples for the EOT visit were collected today. Patient returned her empty palbociclib bottles for Cycles 24-26, and these were returned to pharmacist, Raul Del, for proper accountability. Pateint states that she did complete her palbociclib and anti-hormone therapy diaries for cycles 24-26, however, since they were completed over a month ago, she does not recall where she placed them for safekeeping. Reviewed drug administration dates as follows: patient reports that she has taken anastrozole continuously with no missed doses. She continues to take anastrozole daily. She reports that she took palbociclib on the prescribed dates in June, July and August and that her last dose of palbociclib was taken on 03/30/2018. She did not miss any doses of palbociclib and she did not take any during the 7-day breaks between C-24 and C-25 and between C-25 and C-26. Patient was given postage-paid, pre-addressed envelopes to mail the diaries in, if she is able to find them. Patient reports improvement in symptoms of nausea, fatigue (improved to grade 1) and weakness following discontinuation of palbociclib. Patient otherwise denies any change in her previously reported adverse events.  Patient is aware that PALLAS study requirements are lessened now during Phase I Follow-up; as much as possible, these will be coordinated with her follow-up visits for the [facility] [id number] BWEL study. She will return to clinic in three months for a BWEL follow-up visit. Patient is aware that some PALLAS study follow-up visits may be conducted by phone. Thanked patient for her time and continued support of study and she was encouraged to call with any questions or concerns she may have.   Cindy S. Brigitte Pulse BSN, RN, CCRP 05/08/2018 3:05 PM  Adverse Event Log PALLAS Cycles 24-EOT: 10/21/2017 - 01/13/2018 Event Grade Onset Date Resolved Date Attribution to palbociclib Attribution to anastrozole Treatment  Dry skin 1 04/04/2016 ongoing No Yes   Itching 1 04/04/2016 ongoing No No   Hypertension, intermittent 3 05/30/2016 ongoing No No Hyzaar, amlodipine  Depression 2 07/17/2016 ongoing No No   Decreased concentration 1 07/17/2016 ongoing Yes Yes   Peripheral neuropathy, int 2 11/19/2016 ongoing No No   Insomnia 2 12/20/16 ongoing No No Trazodone  Allergic rhinitis 2 6/UN/2018 ongoing No No Benadryl  Type 2 DM 2 03/28/2017 ongoing No No Metformin  Hypokalemia 1 03/28/2017 ongoing No No Supplement  Weight loss 1 07/24/2017 ongoing No No   Fatigue 2 09/23/2017 04/20/2018 Yes Yes Decreased to grade 1 after IP ended  Generalized weakness 2 09/23/2017 04/20/2018 No No   Clammy skin 1 09/23/2017 ongoing No No   Lightheadedness 1 09/23/2017 ongoing No No   Skin burning sensation 1 09/23/2017 ongoing No No   Lateral leg pain 2 10/21/2017 ongoing No No   Nausea, intermittent 1 12/04/2017  04/20/2018 Yes No   Tearing, increased 1 10/04/2017 05/08/2018 No No   Tearing, increased 2 05/08/2018 ongoing No No   Left hip pain 2 01/18/2018 ongoing No No   Additional out of range hematology and chemistry lab results were not considered clinically significant. Cindy S. Brigitte Pulse BSN, RN, CCRP 05/17/2018 12:34 PM

## 2018-05-09 ENCOUNTER — Encounter: Payer: Self-pay | Admitting: Oncology

## 2018-05-09 ENCOUNTER — Other Ambulatory Visit: Payer: Self-pay | Admitting: *Deleted

## 2018-05-09 ENCOUNTER — Telehealth: Payer: Self-pay | Admitting: *Deleted

## 2018-05-09 DIAGNOSIS — Z170 Estrogen receptor positive status [ER+]: Principal | ICD-10-CM

## 2018-05-09 DIAGNOSIS — C50011 Malignant neoplasm of nipple and areola, right female breast: Secondary | ICD-10-CM

## 2018-05-09 DIAGNOSIS — C50012 Malignant neoplasm of nipple and areola, left female breast: Secondary | ICD-10-CM

## 2018-05-09 DIAGNOSIS — C50412 Malignant neoplasm of upper-outer quadrant of left female breast: Secondary | ICD-10-CM

## 2018-05-09 DIAGNOSIS — G4720 Circadian rhythm sleep disorder, unspecified type: Secondary | ICD-10-CM

## 2018-05-09 DIAGNOSIS — G629 Polyneuropathy, unspecified: Secondary | ICD-10-CM

## 2018-05-09 DIAGNOSIS — C50211 Malignant neoplasm of upper-inner quadrant of right female breast: Secondary | ICD-10-CM

## 2018-05-09 LAB — HEMOGLOBIN A1C
Hgb A1c MFr Bld: 7.8 % — ABNORMAL HIGH (ref 4.8–5.6)
Mean Plasma Glucose: 177 mg/dL

## 2018-05-09 MED ORDER — POTASSIUM CHLORIDE ER 10 MEQ PO CPCR: 20.0000 meq | ORAL_CAPSULE | Freq: Two times a day (BID) | ORAL | 4 refills | Status: DC

## 2018-05-09 MED ORDER — VENLAFAXINE HCL ER 150 MG PO CP24: 150.0000 mg | ORAL_CAPSULE | Freq: Every day | ORAL | 3 refills | Status: DC

## 2018-05-09 MED ORDER — GABAPENTIN 300 MG PO CAPS: ORAL_CAPSULE | ORAL | 1 refills | Status: DC

## 2018-05-09 MED ORDER — TRAMADOL HCL 50 MG PO TABS: 50.0000 mg | ORAL_TABLET | Freq: Three times a day (TID) | ORAL | 0 refills | Status: DC | PRN

## 2018-05-09 MED ORDER — IBUPROFEN 800 MG PO TABS: ORAL_TABLET | ORAL | 3 refills | Status: DC

## 2018-05-09 MED ORDER — TRAZODONE HCL 50 MG PO TABS: ORAL_TABLET | ORAL | 1 refills | Status: DC

## 2018-05-09 MED FILL — VENLAFAXINE HCL ER 150 MG C: 150 | 90 days supply | Qty: 90 | Fill #0

## 2018-05-09 MED FILL — traZODone HCL 50 MG TABS: 50 | 33 days supply | Qty: 60 | Fill #0

## 2018-05-09 MED FILL — POTASSIUM CL ER 10 MEQ CAP: 10 | 45 days supply | Qty: 180 | Fill #0

## 2018-05-09 MED FILL — IBUPROFEN 800 MG TAB: 800 | 30 days supply | Qty: 90 | Fill #0

## 2018-05-09 MED FILL — GABAPENTIN 300 MG CAPSULE: 300 | 60 days supply | Qty: 180 | Fill #0

## 2018-05-09 MED FILL — traMADol HCL 50 MG TABS: 50 | 20 days supply | Qty: 60 | Fill #0

## 2018-05-09 NOTE — Telephone Encounter (Signed)
This RN spoke with pt per her return call from this office regarding recent potassium level and need to verify current prescription replacement compliance.  Margaret [name] verified she is taking 10 mEq bid.  Per MD - need to increase to 20 mEq bid.  Above discussed - refill with dose change and increased dispense amount.  Pt requested refills on other meds- obtained and sent to verified pharmacy.

## 2018-05-13 ENCOUNTER — Other Ambulatory Visit: Payer: Self-pay | Admitting: Adult Health

## 2018-05-13 DIAGNOSIS — E119 Type 2 diabetes mellitus without complications: Secondary | ICD-10-CM

## 2018-05-16 ENCOUNTER — Encounter: Payer: Self-pay | Admitting: *Deleted

## 2018-05-16 DIAGNOSIS — Z170 Estrogen receptor positive status [ER+]: Principal | ICD-10-CM

## 2018-05-16 DIAGNOSIS — C50412 Malignant neoplasm of upper-outer quadrant of left female breast: Secondary | ICD-10-CM

## 2018-05-16 NOTE — Research (Signed)
05/16/2018   AFT-05 PALLAS - Research Consent Form Encounter See EOT visit Research Encounter dated 05/08/2018 for details of reconsent process. A copy of the signed consent form was mailed to the patient on 05/09/2018. Cindy S. Brigitte Pulse BSN, RN, [place] 05/16/2018 4:48 PM

## 2018-05-20 ENCOUNTER — Ambulatory Visit: Payer: Self-pay

## 2018-05-20 NOTE — Telephone Encounter (Signed)
Patient called in and says "I don't know how to explain what is going on with me. Sometimes, my hands start shaking to the point I can't hold anything, then it stops. I start coughing, then I start shaking and stop. Saturday, I came home from work and was sitting on the couch. I started coughing, then shaking and the next thing I knew, I was on the floor with my husband calling my name. He said I was shaking like a seizure. I didn't go to the hospital because I felt fine. I explain what happens to my Oncologist, but because the symptoms don't happen right then and there, it's really nothing that can be done." I advised I can schedule an appointment with Dr. Sharlet Salina and that she is past due for a physical, she says "schedule the appointments on Tuesday or Thursday. Appointment scheduled for Thursday, 05/22/18 at 0920 with Dr. Sharlet Salina, care advice given, patient verbalized understanding. Physical scheduled for Tuesday, 06/10/18 at 0800 with Dr. Sharlet Salina, patient verbalized understanding.   Reason for Disposition . Nursing judgment  Protocols used: NO GUIDELINE OR REFERENCE AVAILABLE-A-AH

## 2018-05-21 MED FILL — PANTOPRAZOLE SOD DR 40 MG T: 40 | 30 days supply | Qty: 30 | Fill #1

## 2018-05-21 MED FILL — ANASTROZOLE 1 MG TABLET: 1 | 90 days supply | Qty: 90 | Fill #3

## 2018-05-21 MED FILL — LOSARTAN-HCTZ 100-25 MG TAB: 100-25 | 30 days supply | Qty: 30 | Fill #1

## 2018-05-22 ENCOUNTER — Emergency Department (HOSPITAL_COMMUNITY): Payer: Medicare Other

## 2018-05-22 ENCOUNTER — Other Ambulatory Visit: Payer: Self-pay

## 2018-05-22 ENCOUNTER — Ambulatory Visit (INDEPENDENT_AMBULATORY_CARE_PROVIDER_SITE_OTHER): Payer: Medicare Other | Admitting: Internal Medicine

## 2018-05-22 ENCOUNTER — Encounter (HOSPITAL_COMMUNITY): Payer: Self-pay

## 2018-05-22 ENCOUNTER — Encounter: Payer: Self-pay | Admitting: Internal Medicine

## 2018-05-22 ENCOUNTER — Emergency Department (HOSPITAL_COMMUNITY)
Admission: EM | Admit: 2018-05-22 | Discharge: 2018-05-22 | Disposition: A | Discharge status: Home / Self Care | Payer: Medicare Other | Attending: Emergency Medicine | Admitting: Emergency Medicine

## 2018-05-22 VITALS — BP 122/98 | HR 101 | Temp 98.4°F | Ht 67.72 in | Wt 272.0 lb

## 2018-05-22 DIAGNOSIS — Z87891 Personal history of nicotine dependence: Secondary | ICD-10-CM | POA: Insufficient documentation

## 2018-05-22 DIAGNOSIS — Z91010 Allergy to peanuts: Secondary | ICD-10-CM | POA: Diagnosis not present

## 2018-05-22 DIAGNOSIS — R55 Syncope and collapse: Secondary | ICD-10-CM | POA: Diagnosis not present

## 2018-05-22 DIAGNOSIS — R569 Unspecified convulsions: Secondary | ICD-10-CM | POA: Diagnosis not present

## 2018-05-22 DIAGNOSIS — Z23 Encounter for immunization: Secondary | ICD-10-CM | POA: Diagnosis not present

## 2018-05-22 DIAGNOSIS — Z79899 Other long term (current) drug therapy: Secondary | ICD-10-CM | POA: Diagnosis not present

## 2018-05-22 DIAGNOSIS — R05 Cough: Secondary | ICD-10-CM | POA: Diagnosis not present

## 2018-05-22 DIAGNOSIS — R519 Headache, unspecified: Secondary | ICD-10-CM

## 2018-05-22 DIAGNOSIS — I160 Hypertensive urgency: Secondary | ICD-10-CM

## 2018-05-22 DIAGNOSIS — Z853 Personal history of malignant neoplasm of breast: Secondary | ICD-10-CM | POA: Insufficient documentation

## 2018-05-22 DIAGNOSIS — R51 Headache: Secondary | ICD-10-CM | POA: Diagnosis not present

## 2018-05-22 DIAGNOSIS — R059 Cough, unspecified: Secondary | ICD-10-CM

## 2018-05-22 DIAGNOSIS — I10 Essential (primary) hypertension: Secondary | ICD-10-CM | POA: Insufficient documentation

## 2018-05-22 DIAGNOSIS — Z91040 Latex allergy status: Secondary | ICD-10-CM | POA: Diagnosis not present

## 2018-05-22 LAB — CBC WITH DIFFERENTIAL/PLATELET
Abs Immature Granulocytes: 0.06 10*3/uL (ref 0.00–0.07)
Basophils Absolute: 0.1 10*3/uL (ref 0.0–0.1)
Basophils Relative: 1 %
Eosinophils Absolute: 0.2 10*3/uL (ref 0.0–0.5)
Eosinophils Relative: 2 %
HCT: 40.9 % (ref 36.0–46.0)
Hemoglobin: 13.4 g/dL (ref 12.0–15.0)
Immature Granulocytes: 1 %
Lymphocytes Relative: 20 %
Lymphs Abs: 2.4 10*3/uL (ref 0.7–4.0)
MCH: 34 pg (ref 26.0–34.0)
MCHC: 32.8 g/dL (ref 30.0–36.0)
MCV: 103.8 fL — ABNORMAL HIGH (ref 80.0–100.0)
Monocytes Absolute: 0.9 10*3/uL (ref 0.1–1.0)
Monocytes Relative: 7 %
Neutro Abs: 8.6 10*3/uL — ABNORMAL HIGH (ref 1.7–7.7)
Neutrophils Relative %: 69 %
Platelets: 182 10*3/uL (ref 150–400)
RBC: 3.94 MIL/uL (ref 3.87–5.11)
RDW: 13.2 % (ref 11.5–15.5)
WBC: 12.3 10*3/uL — ABNORMAL HIGH (ref 4.0–10.5)
nRBC: 0 % (ref 0.0–0.2)

## 2018-05-22 LAB — COMPREHENSIVE METABOLIC PANEL
ALT: 29 U/L (ref 0–44)
AST: 24 U/L (ref 15–41)
Albumin: 3.7 g/dL (ref 3.5–5.0)
Alkaline Phosphatase: 94 U/L (ref 38–126)
Anion gap: 9 (ref 5–15)
BUN: 17 mg/dL (ref 6–20)
CO2: 28 mmol/L (ref 22–32)
Calcium: 9.2 mg/dL (ref 8.9–10.3)
Chloride: 104 mmol/L (ref 98–111)
Creatinine, Ser: 0.77 mg/dL (ref 0.44–1.00)
GFR calc Af Amer: >60 mL/min (ref >60)
GFR calc non Af Amer: >60 mL/min (ref >60)
Glucose, Bld: 139 mg/dL — ABNORMAL HIGH (ref 70–99)
Potassium: 3.4 mmol/L — ABNORMAL LOW (ref 3.5–5.1)
Sodium: 141 mmol/L (ref 135–145)
Total Bilirubin: 0.3 mg/dL (ref 0.3–1.2)
Total Protein: 7.9 g/dL (ref 6.5–8.1)

## 2018-05-22 NOTE — ED Triage Notes (Addendum)
Patient reports that she "Coughs so hard it makes her pass out." The last episode 5 days ago. Patient states it is happening more frequent. Patient states she coughs when there are fragrant things in a room, etc. Patient states, when I cough "I feel a swoosh in my head then I pass out."  Patient went to see her PCP today and was sent to the ED for further evaluation.

## 2018-05-22 NOTE — ED Notes (Signed)
Patient transported to X-ray 

## 2018-05-22 NOTE — Assessment & Plan Note (Signed)
Needs to go to ER immediately for evaluation with CT and/or MRI depending on findings. Concerning for possible seizures versus TIA episode/syncope over the weekend. Husband not available for questioning during visit who witnessed the event. He will drive her to ER. She does not drive and informed she cannot drive for at least 6 months or longer depending on etiology of these spells. She is on multiple medications that can lower the seizure threshold and needs assessment urgently by neurology.

## 2018-05-22 NOTE — Patient Instructions (Signed)
We need you to go to the ER to have them check you for the new seizures and concern for ruling out mini strokes.

## 2018-05-22 NOTE — Discharge Instructions (Addendum)
Follow-up with cardiology for further evaluation.  Please also plan to follow-up with your primary care provider in the next week.  Return to the emergency room for any worsening or concerning symptoms.

## 2018-05-22 NOTE — ED Provider Notes (Signed)
[facility] DEPT Provider Note   CSN: [id number] Arrival date & time: 05/22/18  1006     History   Chief Complaint Chief Complaint  Patient presents with  . Cough    HPI Margaret [name] is a 57 y.o. female.  57 year old female presents with complaint of syncopal episodes with cough.  Patient states these episodes first started 1 year ago, most recent episode was 5 days ago, states she just come home from work and was sitting at the table with her husband when she coughed one time in the next thing she can remembers lying in the ground waking up to her husband calling her name.  Patient states that she lost control of her bladder with this episode and felt like her arms and legs were shaking when she woke up.  Past history includes breast cancer, diagnosed and treated with chemotherapy and mastectomy 3 years ago, in remission x3 years, states she thought maybe this was a side effect of her chemo.  This was the first episode where she has fallen to the ground or had loss of bladder control which concerned her and prompted today's visit.  Prior to this episode patient had an episode in June while at a family reunion, sitting a chair.  States that she woke up to family members calling her name and she had spilled a soda all over herself.  Patient has not been seen for this previously.  Patient states this happens with coughing only 1 or 2 times, denies heavy or multi-cough episode.  Denies chest pain, shortness of breath, arm or leg weakness, changes in speech or gait. No other complaints or concerns.      Past Medical History:  Diagnosis Date  . Bilateral breast cancer ([place])   . Breast cancer ([place])   . Breast cancer of upper-outer quadrant of left female breast ([place]) 04/21/2015  . GERD (gastroesophageal reflux disease)   . Hypertension     Patient Active Problem List   Diagnosis Date Noted  . New onset seizure ([place]) 05/22/2018  . Hypertensive urgency  05/22/2018  . Acute pain of right knee 03/29/2017  . Adjustment disorder with mixed anxiety and depressed mood 07/17/2016  . Morbid obesity ([place]) 11/28/2015  . Chest wall pain 11/28/2015  . Malignant neoplasm of upper-inner quadrant of right breast in female, estrogen receptor positive ([place]) 04/27/2015  . Hypertension, essential 04/27/2015  . Malignant neoplasm of upper-outer quadrant of left breast in female, estrogen receptor positive ([place]) 04/21/2015    Past Surgical History:  Procedure Laterality Date  . ABDOMINAL HYSTERECTOMY  1990  . MASTECTOMY MODIFIED RADICAL Left 05/09/2015  . MASTECTOMY MODIFIED RADICAL Left 05/09/2015   Procedure: LEFT MODIFIED RADICAL MASTETCTOMY;  Surgeon: Autumn Messing III, MD;  Location: [facility];  Service: General;  Laterality: Left;  Marland Kitchen MASTECTOMY W/ SENTINEL NODE BIOPSY Right   . MASTECTOMY W/ SENTINEL NODE BIOPSY Right 05/09/2015   Procedure: RIGHT MASTECTOMY WITH RIGHT AXILLARY SENTINEL LYMPH NODE BIOPSY;  Surgeon: Autumn Messing III, MD;  Location: [facility];  Service: General;  Laterality: Right;  . PORTACATH PLACEMENT Right 09/01/2015   Procedure: INSERTION PORT-A-CATH;  Surgeon: Autumn Messing III, MD;  Location: [facility];  Service: General;  Laterality: Right;  . TUBAL LIGATION  1984     OB History   None      Home Medications    Prior to Admission medications   Medication Sig Start Date End Date Taking? Authorizing Provider  amLODipine (  NORVASC) 5 MG tablet Take 1 tablet (5 mg total) by mouth daily. 07/24/17  Yes Magrinat, Virgie Dad, MD  anastrozole (ARIMIDEX) 1 MG tablet Take 1 tablet (1 mg total) by mouth daily. 01/13/18  Yes Magrinat, Virgie Dad, MD  calcium-vitamin D (OSCAL WITH D) 500-200 MG-UNIT tablet Take 1 tablet by mouth 2 (two) times daily. 05/06/17  Yes Causey, Charlestine Massed, NP  diphenhydrAMINE (BENADRYL) 25 mg capsule Take 1 capsule (25 mg total) by mouth every 6 (six) hours as needed. Patient taking differently: Take 25 mg by mouth  every 6 (six) hours as needed for allergies.  01/31/17  Yes Magrinat, Virgie Dad, MD  gabapentin (NEURONTIN) 300 MG capsule TAKE 2 CAPSULES (600 MG TOTAL) BY MOUTH 3 TIMES DAILY. Patient taking differently: Take 600 mg by mouth 3 (three) times daily.  05/09/18  Yes Magrinat, Virgie Dad, MD  ibuprofen (ADVIL,MOTRIN) 800 MG tablet TAKE 1 TABLET (800 MG TOTAL) BY MOUTH EVERY 8 HOURS AS NEEDED. Patient taking differently: Take 800 mg by mouth every 8 (eight) hours as needed for moderate pain.  05/09/18  Yes Magrinat, Virgie Dad, MD  losartan-hydrochlorothiazide (HYZAAR) 100-25 MG tablet TAKE 1 TABLET BY MOUTH DAILY. 02/20/18  Yes Magrinat, Virgie Dad, MD  metFORMIN (GLUCOPHAGE) 500 MG tablet TAKE 1 TABLET BY MOUTH TWICE DAILY Patient taking differently: Take 500 mg by mouth 2 (two) times daily.  05/13/18  Yes Causey, Charlestine Massed, NP  Multiple Vitamins-Minerals (CENTRUM SILVER 50+WOMEN PO) Take 1 tablet by mouth daily.   Yes [provider]  pantoprazole (PROTONIX) 40 MG tablet TAKE 1 TABLET BY MOUTH ONCE DAILY Patient taking differently: Take 40 mg by mouth daily.  04/04/18  Yes Magrinat, Virgie Dad, MD  potassium chloride (MICRO-K) 10 MEQ CR capsule Take 2 capsules (20 mEq total) by mouth 2 (two) times daily. 05/09/18  Yes Magrinat, Virgie Dad, MD  traMADol (ULTRAM) 50 MG tablet TAKE 1 TABLET BY MOUTH EVERY 8 HOURS AS NEEDED Patient taking differently: Take 50 mg by mouth every 8 (eight) hours as needed for moderate pain.  04/04/18  Yes Magrinat, Virgie Dad, MD  traZODone (DESYREL) 50 MG tablet TAKE 1 TABLET BY MOUTH AT BEDTIME. MAY INCREASE TO 2 TABLETS AFTER A FEW DAYS IF NEEDED Patient taking differently: Take 100 mg by mouth at bedtime as needed for sleep.  05/09/18  Yes Magrinat, Virgie Dad, MD  valACYclovir (VALTREX) 1000 MG tablet Take 1 tablet (1,000 mg total) by mouth 2 (two) times daily. 03/28/17  Yes Gardenia Phlegm, NP  venlafaxine XR (EFFEXOR-XR) 150 MG 24 hr capsule Take 1 capsule (150 mg  total) by mouth daily with breakfast. 05/09/18  Yes Magrinat, Virgie Dad, MD  traMADol (ULTRAM) 50 MG tablet Take 1 tablet (50 mg total) by mouth every 8 (eight) hours as needed. Patient not taking: Reported on 05/22/2018 05/09/18   Magrinat, Virgie Dad, MD  valACYclovir (VALTREX) 1000 MG tablet TAKE 1 TABLET BY MOUTH 2 TIMES DAILY. Patient not taking: Reported on 05/22/2018 04/04/18   Magrinat, Virgie Dad, MD    Family History Family History  Problem Relation Age of Onset  . Hypertension Mother   . Diabetes Mother     Social History Social History   Tobacco Use  . Smoking status: Former Smoker    Packs/day: 0.10    Years: 28.00    Pack years: 2.80  . Smokeless tobacco: Never Used  Substance Use Topics  . Alcohol use: No    Alcohol/week: 0.0 standard drinks  .  Drug use: No     Allergies   Banana; Latex; Peanuts [peanut oil]; and Wheat bran   Review of Systems Review of Systems  Constitutional: Negative for chills and fever.  HENT: Negative for congestion.   Eyes: Negative for visual disturbance.  Respiratory: Positive for cough. Negative for shortness of breath.   Cardiovascular: Negative for chest pain.  Gastrointestinal: Negative for abdominal pain, constipation, diarrhea, nausea and vomiting.  Musculoskeletal: Negative for arthralgias and myalgias.  Skin: Negative for rash and wound.  Neurological: Positive for syncope and headaches. Negative for dizziness, facial asymmetry, speech difficulty, weakness, light-headedness and numbness.  Psychiatric/Behavioral: Negative for confusion.  All other systems reviewed and are negative.    Physical Exam Updated Vital Signs BP (!) 155/90   Pulse 71   Temp 98.4 F (36.9 C) (Oral)   Resp 10   Ht 5' 7.75" (1.721 m)   Wt 123.7 kg   SpO2 94%   BMI 41.77 kg/m   Physical Exam  Constitutional: She is oriented to person, place, and time. She appears well-developed and well-nourished. No distress.  HENT:  Head: Normocephalic  and atraumatic.  Cardiovascular: Normal rate, regular rhythm, normal heart sounds and intact distal pulses.  No murmur heard. Pulmonary/Chest: Effort normal and breath sounds normal. No respiratory distress.  Abdominal: Soft. She exhibits no distension. There is no tenderness.  Musculoskeletal: She exhibits no tenderness.  Neurological: She is alert and oriented to person, place, and time. She has normal strength and normal reflexes. She is not disoriented. She displays normal reflexes. No cranial nerve deficit or sensory deficit. She exhibits normal muscle tone. GCS eye subscore is 4. GCS verbal subscore is 5. GCS motor subscore is 6.  No arm or leg drift, no asymmetry.  Skin: Skin is warm and dry. No rash noted. She is not diaphoretic.  Psychiatric: She has a normal mood and affect. Her behavior is normal.  Nursing note and vitals reviewed.    ED Treatments / Results  Labs (all labs ordered are listed, but only abnormal results are displayed) Labs Reviewed  COMPREHENSIVE METABOLIC PANEL - Abnormal; Notable for the following components:      Result Value   Potassium 3.4 (*)    Glucose, Bld 139 (*)    All other components within normal limits  CBC WITH DIFFERENTIAL/PLATELET - Abnormal; Notable for the following components:   WBC 12.3 (*)    MCV 103.8 (*)    Neutro Abs 8.6 (*)    All other components within normal limits    EKG EKG Interpretation  Date/Time:  Thursday May 22 2018 12:48:45 EDT Ventricular Rate:  77 PR Interval:    QRS Duration: 92 QT Interval:  409 QTC Calculation: 463 R Axis:   1 Text Interpretation:  Sinus rhythm Borderline prolonged PR interval Left ventricular hypertrophy Borderline T abnormalities, lateral leads unremarkable ECG Confirmed by Carmin Muskrat [phone]) on 05/22/2018 1:55:34 PM Also confirmed by Carmin Muskrat ([id number]), editor Hattie Perch [phone])  on 05/22/2018 2:38:29 PM   Radiology Dg Chest 2 View  Result Date:  05/22/2018 CLINICAL DATA:  Possible seizure. EXAM: CHEST - 2 VIEW COMPARISON:  September 01, 2015 FINDINGS: The left base is obscured by overlapping soft tissues. The heart, hila, mediastinum, lungs, and pleura are otherwise unremarkable. IMPRESSION: Obscuration left lower chest due to overlapping soft tissues. No other abnormalities noted. Electronically Signed   By: Dorise Bullion III M.D   On: 05/22/2018 11:27   Ct Head Wo Contrast  Result Date:  05/22/2018 CLINICAL DATA:  Recent syncopal episode following coughing EXAM: CT HEAD WITHOUT CONTRAST TECHNIQUE: Contiguous axial images were obtained from the base of the skull through the vertex without intravenous contrast. COMPARISON:  10/23/2013 FINDINGS: Brain: No evidence of acute infarction, hemorrhage, hydrocephalus, extra-axial collection or mass lesion/mass effect. Vascular: No hyperdense vessel or unexpected calcification. Skull: Normal. Negative for fracture or focal lesion. Sinuses/Orbits: No acute finding. Other: None. IMPRESSION: Normal head CT for age Electronically Signed   By: Inez Catalina M.D.   On: 05/22/2018 14:29    Procedures Procedures (including critical care time)  Medications Ordered in ED Medications - No data to display   Initial Impression / Assessment and Plan / ED Course  I have reviewed the triage vital signs and the nursing notes.  Pertinent labs & imaging results that were available during my care of the patient were reviewed by me and considered in my medical decision making (see chart for details).  Clinical Course as of May 23 1447  Thu May 22, 4953  [id number] 57 year old female presents with complaint of syncope which occurs when she coughs.  Patient has had these episodes for the past year, most recent episode was on Saturday (5 days ago.  Her exam today is unremarkable, patient states she is having a headache today.  Patient's chest x-ray, labs including CBC and CMP and EKG are unremarkable.  CT had completed due  to complaint of headache today and is also normal. Case discussed with Dr. Vanita Panda, ER attending, who has seen the patient. Recommends follow up with cardiology. Patient referred to cardiology for further evaluation, also recommend she see her PCP.   After the discussed management above, the patient was determined to be safe for discharge.  The patient was in agreement with this plan and all questions regarding their care were answered.  ED return precautions were discussed and the patient will return to the ED with any significant worsening of condition.      [LM]    Clinical Course User Index [LM] Tacy Learn, PA-C   Final Clinical Impressions(s) / ED Diagnoses   Final diagnoses:  Cough  Syncope, unspecified syncope type  Nonintractable headache, unspecified chronicity pattern, unspecified headache type    ED Discharge Orders    None       Tacy Learn, PA-C 05/22/18 1448    Carmin Muskrat, MD 05/22/18 1609

## 2018-05-22 NOTE — Assessment & Plan Note (Signed)
With headaches and high blood pressure she does meet criteria for urgency. Needs to go to ER for evaluation of neurological episodes. Currently taking amloidpine 5 mg daily and losartan/hctz 100/25. Amlodipine can be increased or beta blocker added.

## 2018-05-22 NOTE — Progress Notes (Signed)
   Subjective:    Patient ID: Margaret [name], female    DOB: 1961/07/28, 57 y.o.   MRN: [id number]  HPI The patient is a 57 YO female coming in for possible episode of seizure about 4-5 days ago. She was coughing and then started shaking. She then woke up on the floor. She states her husband told her it looked like a seizure. She is not sure if she was confused afterwards but was sleepy afterwards and rested. She did not seek care at that time. Called in and got an appointment. She has had shaking spells since august. She describes those as random and she will twitch in her arms and spill whatever she is holding. These last 1-2 minutes and then she feels normal afterwards. She is also having new headaches recently and has some strange feeling in her head today she describes as a woosh in her head. Also a sinking feeling. She has also had significant high blood pressure at oncology visits and has not seen this provide in almost 2 years. Oncology is checking HgA1c but not adjusted treatment for diabetes. She is taking tramadol, effexor and gabapentin. No recent head imaging in the last several years. She is concerned as these episodes are almost daily and increasing in frequency and in intensity. She is also having vision changes and has discussed these with her oncologist who was supposed to get her in with eye specialist and is having some difficulty seeing today. This is going on for some time.   PMH, [facility], social history reviewed and updated.  Review of Systems  Constitutional: Positive for activity change and fatigue. Negative for appetite change, chills, diaphoresis, fever and unexpected weight change.  HENT: Negative.   Eyes: Positive for visual disturbance. Negative for photophobia, pain, discharge, redness and itching.  Respiratory: Negative.   Cardiovascular: Negative.   Gastrointestinal: Negative.   Musculoskeletal: Positive for arthralgias.  Skin: Negative.   Neurological: Positive for  seizures, syncope, numbness and headaches.  Psychiatric/Behavioral: Negative.       Objective:   Physical Exam  Constitutional: She is oriented to person, place, and time. She appears well-developed and well-nourished.  HENT:  Head: Normocephalic and atraumatic.  Eyes: EOM are normal.  Neck: Normal range of motion.  Cardiovascular: Normal rate and regular rhythm.  Pulmonary/Chest: Effort normal and breath sounds normal. No respiratory distress. She has no wheezes. She has no rales.  Abdominal: Soft. Bowel sounds are normal. She exhibits no distension. There is no tenderness. There is no rebound.  Musculoskeletal: She exhibits tenderness. She exhibits no edema.  Headache currently  Neurological: She is alert and oriented to person, place, and time. She displays normal reflexes. A cranial nerve deficit is present. She exhibits normal muscle tone. Coordination normal.  Some weakness on the right side upper, sensation intact throughout, CN 2-12 intact  Skin: Skin is warm and dry.  Psychiatric: She has a normal mood and affect.   Vitals:   05/22/18 0911  BP: (!) 122/98  Pulse: (!) 101  Temp: 98.4 F (36.9 C)  TempSrc: Oral  SpO2: 96%  Weight: 272 lb (123.4 kg)  Height: 5' 7.72" (1.72 m)      Assessment & Plan:  Flu shot given at visit

## 2018-05-22 NOTE — ED Notes (Signed)
Pt agrees to take missed BP meds when she gets home.

## 2018-05-22 NOTE — ED Notes (Signed)
RN has tried multiple times to get EKG to capture. RN and NT troubleshooting now

## 2018-06-05 ENCOUNTER — Other Ambulatory Visit: Payer: Self-pay | Admitting: Oncology

## 2018-06-05 MED FILL — metFORMIN HCL 500 MG TABS: 500 | 30 days supply | Qty: 60 | Fill #0

## 2018-06-05 MED FILL — IBUPROFEN 800 MG TAB: 800 | 30 days supply | Qty: 90 | Fill #1

## 2018-06-05 MED FILL — valACYclovir HCL 1 GM TABS: 1 | 45 days supply | Qty: 90 | Fill #1

## 2018-06-06 MED FILL — traMADol HCL 50 MG TABS: 50 | 20 days supply | Qty: 60 | Fill #0

## 2018-06-10 ENCOUNTER — Encounter: Payer: Medicare Other | Admitting: Internal Medicine

## 2018-06-23 MED FILL — PANTOPRAZOLE SOD DR 40 MG T: 40 | 30 days supply | Qty: 30 | Fill #2

## 2018-06-23 MED FILL — LOSARTAN-HCTZ 100-25 MG TAB: 100-25 | 30 days supply | Qty: 30 | Fill #2

## 2018-06-23 MED FILL — traZODone HCL 50 MG TABS: 50 | 33 days supply | Qty: 60 | Fill #1

## 2018-06-24 ENCOUNTER — Encounter: Payer: Medicare Other | Admitting: Internal Medicine

## 2018-07-09 MED FILL — IBUPROFEN 800 MG TAB: 800 | 30 days supply | Qty: 90 | Fill #2

## 2018-07-09 MED FILL — POTASSIUM CL ER 10 MEQ CAP: 10 | 45 days supply | Qty: 180 | Fill #1

## 2018-07-09 MED FILL — GABAPENTIN 300 MG CAPSULE: 300 | 30 days supply | Qty: 180 | Fill #1

## 2018-07-10 ENCOUNTER — Encounter: Payer: Medicare Other | Admitting: Internal Medicine

## 2018-07-14 MED FILL — AMLODIPINE BESYLATE 5 MG TA: 5 | 90 days supply | Qty: 90 | Fill #3

## 2018-07-14 MED FILL — metFORMIN HCL 500 MG TABS: 500 | 30 days supply | Qty: 60 | Fill #1

## 2018-07-15 ENCOUNTER — Ambulatory Visit (INDEPENDENT_AMBULATORY_CARE_PROVIDER_SITE_OTHER): Payer: Medicare Other | Admitting: Internal Medicine

## 2018-07-15 ENCOUNTER — Encounter: Payer: Self-pay | Admitting: Internal Medicine

## 2018-07-15 VITALS — BP 120/70 | HR 96 | Temp 98.0°F | Ht 67.75 in | Wt 274.0 lb

## 2018-07-15 DIAGNOSIS — E1169 Type 2 diabetes mellitus with other specified complication: Secondary | ICD-10-CM

## 2018-07-15 DIAGNOSIS — E119 Type 2 diabetes mellitus without complications: Secondary | ICD-10-CM | POA: Insufficient documentation

## 2018-07-15 DIAGNOSIS — Z0000 Encounter for general adult medical examination without abnormal findings: Secondary | ICD-10-CM | POA: Diagnosis not present

## 2018-07-15 DIAGNOSIS — C50211 Malignant neoplasm of upper-inner quadrant of right female breast: Secondary | ICD-10-CM | POA: Diagnosis not present

## 2018-07-15 DIAGNOSIS — Z170 Estrogen receptor positive status [ER+]: Secondary | ICD-10-CM

## 2018-07-15 DIAGNOSIS — R0789 Other chest pain: Secondary | ICD-10-CM

## 2018-07-15 DIAGNOSIS — F4323 Adjustment disorder with mixed anxiety and depressed mood: Secondary | ICD-10-CM

## 2018-07-15 DIAGNOSIS — Z1159 Encounter for screening for other viral diseases: Secondary | ICD-10-CM

## 2018-07-15 DIAGNOSIS — I10 Essential (primary) hypertension: Secondary | ICD-10-CM

## 2018-07-15 DIAGNOSIS — C50412 Malignant neoplasm of upper-outer quadrant of left female breast: Secondary | ICD-10-CM | POA: Diagnosis not present

## 2018-07-15 MED ORDER — TRAMADOL HCL 50 MG PO TABS: 50.0000 mg | ORAL_TABLET | Freq: Every day | ORAL | 2 refills | Status: DC | PRN

## 2018-07-15 MED ORDER — BUSPIRONE HCL 5 MG PO TABS: 5.0000 mg | ORAL_TABLET | Freq: Every day | ORAL | 3 refills | Status: DC | PRN

## 2018-07-15 MED FILL — busPIRone HCL 5 MG TABS: 5 | 30 days supply | Qty: 30 | Fill #0

## 2018-07-15 MED FILL — traMADol HCL 50 MG TABS: 50 | 30 days supply | Qty: 30 | Fill #0

## 2018-07-15 NOTE — Assessment & Plan Note (Signed)
She is taking amlodipine and losartan/hctz which she has been getting from oncology due to lack of follow up with [place]. Recent BMP is without indication for change. BP at goal. Can refill.

## 2018-07-15 NOTE — Assessment & Plan Note (Signed)
Rx for tramadol 30 with 2 refills today as she is filling about 1 day daily average. She has been getting refills from [place] and oncology and reminded her that if she wants to get controlled substances from [place] she cannot get refills from other providers just for convenience of not having to come see [place].

## 2018-07-15 NOTE — Patient Instructions (Signed)
We will get you in with the GI doctor for colonoscopy.  We will put in the labs to check the hepatitis C test if you want to get that done.  Think about getting the shingles vaccine to help protect yourself from that.   We need to see you back in 3 months to follow up on the diabetes.   Health Maintenance, Female Adopting a healthy lifestyle and getting preventive care can go a long way to promote health and wellness. Talk with your health care provider about what schedule of regular examinations is right for you. This is a good chance for you to check in with your provider about disease prevention and staying healthy. In between checkups, there are plenty of things you can do on your own. Experts have done a lot of research about which lifestyle changes and preventive measures are most likely to keep you healthy. Ask your health care provider for more information. Weight and diet Eat a healthy diet  Be sure to include plenty of vegetables, fruits, low-fat dairy products, and lean protein.  Do not eat a lot of foods high in solid fats, added sugars, or salt.  Get regular exercise. This is one of the most important things you can do for your health. ? Most adults should exercise for at least 150 minutes each week. The exercise should increase your heart rate and make you sweat (moderate-intensity exercise). ? Most adults should also do strengthening exercises at least twice a week. This is in addition to the moderate-intensity exercise.  Maintain a healthy weight  Body mass index (BMI) is a measurement that can be used to identify possible weight problems. It estimates body fat based on height and weight. Your health care provider can help determine your BMI and help you achieve or maintain a healthy weight.  For females 66 years of age and older: ? A BMI below 18.5 is considered underweight. ? A BMI of 18.5 to 24.9 is normal. ? A BMI of 25 to 29.9 is considered overweight. ? A BMI of 30  and above is considered obese.  Watch levels of cholesterol and blood lipids  You should start having your blood tested for lipids and cholesterol at 57 years of age, then have this test every 5 years.  You may need to have your cholesterol levels checked more often if: ? Your lipid or cholesterol levels are high. ? You are older than 57 years of age. ? You are at high risk for heart disease.  Cancer screening Lung Cancer  Lung cancer screening is recommended for adults 31-71 years old who are at high risk for lung cancer because of a history of smoking.  A yearly low-dose CT scan of the lungs is recommended for people who: ? Currently smoke. ? Have quit within the past 15 years. ? Have at least a 30-pack-year history of smoking. A pack year is smoking an average of one pack of cigarettes a day for 1 year.  Yearly screening should continue until it has been 15 years since you quit.  Yearly screening should stop if you develop a health problem that would prevent you from having lung cancer treatment.  Breast Cancer  Practice breast self-awareness. This means understanding how your breasts normally appear and feel.  It also means doing regular breast self-exams. Let your health care provider know about any changes, no matter how small.  If you are in your 20s or 30s, you should have a clinical breast exam (  CBE) by a health care provider every 1-3 years as part of a regular health exam.  If you are 68 or older, have a CBE every year. Also consider having a breast X-ray (mammogram) every year.  If you have a family history of breast cancer, talk to your health care provider about genetic screening.  If you are at high risk for breast cancer, talk to your health care provider about having an MRI and a mammogram every year.  Breast cancer gene (BRCA) assessment is recommended for women who have family members with BRCA-related cancers. BRCA-related cancers  include: ? Breast. ? Ovarian. ? Tubal. ? Peritoneal cancers.  Results of the assessment will determine the need for genetic counseling and BRCA1 and BRCA2 testing.  Cervical Cancer Your health care provider may recommend that you be screened regularly for cancer of the pelvic organs (ovaries, uterus, and vagina). This screening involves a pelvic examination, including checking for microscopic changes to the surface of your cervix (Pap test). You may be encouraged to have this screening done every 3 years, beginning at age 70.  For women ages 56-65, health care providers may recommend pelvic exams and Pap testing every 3 years, or they may recommend the Pap and pelvic exam, combined with testing for human papilloma virus (HPV), every 5 years. Some types of HPV increase your risk of cervical cancer. Testing for HPV may also be done on women of any age with unclear Pap test results.  Other health care providers may not recommend any screening for nonpregnant women who are considered low risk for pelvic cancer and who do not have symptoms. Ask your health care provider if a screening pelvic exam is right for you.  If you have had past treatment for cervical cancer or a condition that could lead to cancer, you need Pap tests and screening for cancer for at least 20 years after your treatment. If Pap tests have been discontinued, your risk factors (such as having a new sexual partner) need to be reassessed to determine if screening should resume. Some women have medical problems that increase the chance of getting cervical cancer. In these cases, your health care provider may recommend more frequent screening and Pap tests.  Colorectal Cancer  This type of cancer can be detected and often prevented.  Routine colorectal cancer screening usually begins at 57 years of age and continues through 57 years of age.  Your health care provider may recommend screening at an earlier age if you have risk factors  for colon cancer.  Your health care provider may also recommend using home test kits to check for hidden blood in the stool.  A small camera at the end of a tube can be used to examine your colon directly (sigmoidoscopy or colonoscopy). This is done to check for the earliest forms of colorectal cancer.  Routine screening usually begins at age 58.  Direct examination of the colon should be repeated every 5-10 years through 57 years of age. However, you may need to be screened more often if early forms of precancerous polyps or small growths are found.  Skin Cancer  Check your skin from head to toe regularly.  Tell your health care provider about any new moles or changes in moles, especially if there is a change in a mole's shape or color.  Also tell your health care provider if you have a mole that is larger than the size of a pencil eraser.  Always use sunscreen. Apply sunscreen liberally  and repeatedly throughout the day.  Protect yourself by wearing long sleeves, pants, a wide-brimmed hat, and sunglasses whenever you are outside.  Heart disease, diabetes, and high blood pressure  High blood pressure causes heart disease and increases the risk of stroke. High blood pressure is more likely to develop in: ? People who have blood pressure in the high end of the normal range (130-139/85-89 mm Hg). ? People who are overweight or obese. ? People who are African American.  If you are 46-16 years of age, have your blood pressure checked every 3-5 years. If you are 10 years of age or older, have your blood pressure checked every year. You should have your blood pressure measured twice-once when you are at a hospital or clinic, and once when you are not at a hospital or clinic. Record the average of the two measurements. To check your blood pressure when you are not at a hospital or clinic, you can use: ? An automated blood pressure machine at a pharmacy. ? A home blood pressure monitor.  If  you are between 28 years and 79 years old, ask your health care provider if you should take aspirin to prevent strokes.  Have regular diabetes screenings. This involves taking a blood sample to check your fasting blood sugar level. ? If you are at a normal weight and have a low risk for diabetes, have this test once every three years after 57 years of age. ? If you are overweight and have a high risk for diabetes, consider being tested at a younger age or more often. Preventing infection Hepatitis B  If you have a higher risk for hepatitis B, you should be screened for this virus. You are considered at high risk for hepatitis B if: ? You were born in a country where hepatitis B is common. Ask your health care provider which countries are considered high risk. ? Your parents were born in a high-risk country, and you have not been immunized against hepatitis B (hepatitis B vaccine). ? You have HIV or AIDS. ? You use needles to inject street drugs. ? You live with someone who has hepatitis B. ? You have had sex with someone who has hepatitis B. ? You get hemodialysis treatment. ? You take certain medicines for conditions, including cancer, organ transplantation, and autoimmune conditions.  Hepatitis C  Blood testing is recommended for: ? Everyone born from [birth year] through 1965. ? Anyone with known risk factors for hepatitis C.  Sexually transmitted infections (STIs)  You should be screened for sexually transmitted infections (STIs) including gonorrhea and chlamydia if: ? You are sexually active and are younger than 57 years of age. ? You are older than 57 years of age and your health care provider tells you that you are at risk for this type of infection. ? Your sexual activity has changed since you were last screened and you are at an increased risk for chlamydia or gonorrhea. Ask your health care provider if you are at risk.  If you do not have HIV, but are at risk, it may be recommended  that you take a prescription medicine daily to prevent HIV infection. This is called pre-exposure prophylaxis (PrEP). You are considered at risk if: ? You are sexually active and do not regularly use condoms or know the HIV status of your partner(s). ? You take drugs by injection. ? You are sexually active with a partner who has HIV.  Talk with your health care provider about whether  you are at high risk of being infected with HIV. If you choose to begin PrEP, you should first be tested for HIV. You should then be tested every 3 months for as long as you are taking PrEP. Pregnancy  If you are premenopausal and you may become pregnant, ask your health care provider about preconception counseling.  If you may become pregnant, take 400 to 800 micrograms (mcg) of folic acid every day.  If you want to prevent pregnancy, talk to your health care provider about birth control (contraception). Osteoporosis and menopause  Osteoporosis is a disease in which the bones lose minerals and strength with aging. This can result in serious bone fractures. Your risk for osteoporosis can be identified using a bone density scan.  If you are 43 years of age or older, or if you are at risk for osteoporosis and fractures, ask your health care provider if you should be screened.  Ask your health care provider whether you should take a calcium or vitamin D supplement to lower your risk for osteoporosis.  Menopause may have certain physical symptoms and risks.  Hormone replacement therapy may reduce some of these symptoms and risks. Talk to your health care provider about whether hormone replacement therapy is right for you. Follow these instructions at home:  Schedule regular health, dental, and eye exams.  Stay current with your immunizations.  Do not use any tobacco products including cigarettes, chewing tobacco, or electronic cigarettes.  If you are pregnant, do not drink alcohol.  If you are  breastfeeding, limit how much and how often you drink alcohol.  Limit alcohol intake to no more than 1 drink per day for nonpregnant women. One drink equals 12 ounces of beer, 5 ounces of wine, or 1 ounces of hard liquor.  Do not use street drugs.  Do not share needles.  Ask your health care provider for help if you need support or information about quitting drugs.  Tell your health care provider if you often feel depressed.  Tell your health care provider if you have ever been abused or do not feel safe at home. This information is not intended to replace advice given to you by your health care provider. Make sure you discuss any questions you have with your health care provider. Document Released: 02/05/2011 Document Revised: 12/29/2015 Document Reviewed: 04/26/2015 Elsevier Interactive Patient Education  [employer].

## 2018-07-15 NOTE — Progress Notes (Signed)
   Subjective:    Patient ID: Margaret [name], female    DOB: 02/08/1961, 57 y.o.   MRN: [id number]  HPI The patient is a 57 YO female coming in for physical.   PMH, [place], social history reviewed and updated  Review of Systems  Constitutional: Negative.   HENT: Negative.   Eyes: Negative.   Respiratory: Negative for cough, chest tightness and shortness of breath.   Cardiovascular: Positive for chest pain. Negative for palpitations and leg swelling.       Chronic, stable  Gastrointestinal: Negative for abdominal distention, abdominal pain, constipation, diarrhea, nausea and vomiting.  Musculoskeletal: Negative.   Skin: Negative.   Neurological: Negative.   Psychiatric/Behavioral: Negative for confusion, decreased concentration, dysphoric mood, hallucinations, self-injury, sleep disturbance and suicidal ideas. The patient is nervous/anxious.       Objective:   Physical Exam  Constitutional: She is oriented to person, place, and time. She appears well-developed and well-nourished.  HENT:  Head: Normocephalic and atraumatic.  Eyes: EOM are normal.  Neck: Normal range of motion.  Cardiovascular: Normal rate and regular rhythm.  Pulmonary/Chest: Effort normal and breath sounds normal. No respiratory distress. She has no wheezes. She has no rales.  Abdominal: Soft. Bowel sounds are normal. She exhibits no distension. There is no tenderness. There is no rebound.  Musculoskeletal: She exhibits no edema.  Neurological: She is alert and oriented to person, place, and time. Coordination normal.  Skin: Skin is warm and dry.  Foot exam done  Psychiatric: She has a normal mood and affect.   Vitals:   07/15/18 1007  BP: 120/70  Pulse: 96  Temp: 98 F (36.7 C)  TempSrc: Oral  SpO2: 98%  Weight: 274 lb (124.3 kg)  Height: 5' 7.75" (1.721 m)      Assessment & Plan:

## 2018-07-15 NOTE — Assessment & Plan Note (Signed)
Diagnosed and appears to be treated by oncology currently. Most recent HgA1c 7.8 which is high. We talked about this and she wants to keep metformin BID 500 mg which is okay but needs visit in 3 months for HgA1c check. She is not on statin so ordered lipid panel. She has neglected other aspects of her health during cancer treatment. We talked about the need to get these back on track.

## 2018-07-15 NOTE — Assessment & Plan Note (Signed)
She is having more anxiety and taking effexor from oncology and requests something for anxiety. Rx for buspar daily prn.

## 2018-07-15 NOTE — Assessment & Plan Note (Signed)
Weight is stable. Complicated by diabetes, hypertension, likely hyperlipidemia (no recent lipid panel). We talked about need to start exercising to help.

## 2018-07-15 NOTE — Assessment & Plan Note (Signed)
Flu shot up to date. Pneumonia declines. Shingrix counseled declines. Tetanus up to date. Colonoscopy needs done given breast cancer and referral to GI placed. Mammogram with oncology, pap smear not indicated. Counseled about sun safety and mole surveillance. Counseled about the dangers of distracted driving. Given 10 year screening recommendations.

## 2018-07-31 MED FILL — LOSARTAN-HCTZ 100-25 MG TAB: 100-25 | 30 days supply | Qty: 30 | Fill #3

## 2018-07-31 MED FILL — PANTOPRAZOLE SOD DR 40 MG T: 40 | 30 days supply | Qty: 30 | Fill #3

## 2018-08-01 MED FILL — VENLAFAXINE HCL ER 150 MG C: 150 | 90 days supply | Qty: 90 | Fill #1

## 2018-08-04 ENCOUNTER — Other Ambulatory Visit: Payer: Self-pay | Admitting: Oncology

## 2018-08-04 DIAGNOSIS — G4720 Circadian rhythm sleep disorder, unspecified type: Secondary | ICD-10-CM

## 2018-08-04 MED FILL — traZODone HCL 50 MG TABS: 50 | 30 days supply | Qty: 60 | Fill #0

## 2018-08-04 MED FILL — IBUPROFEN 800 MG TAB: 800 | 30 days supply | Qty: 90 | Fill #3

## 2018-08-12 ENCOUNTER — Encounter: Payer: Self-pay | Admitting: Gastroenterology

## 2018-08-18 ENCOUNTER — Other Ambulatory Visit: Payer: Self-pay | Admitting: *Deleted

## 2018-08-18 DIAGNOSIS — Z170 Estrogen receptor positive status [ER+]: Principal | ICD-10-CM

## 2018-08-18 DIAGNOSIS — C50412 Malignant neoplasm of upper-outer quadrant of left female breast: Secondary | ICD-10-CM

## 2018-08-18 NOTE — Progress Notes (Signed)
[facility]  [phone] Fax:([phone]) [phone]   ID: Margaret [name] DOB: 06/03/1961  MR#: [id number]  DUK#:[id number]  Patient Care Team: Hoyt Koch, MD as PCP - General (Internal Medicine) Jovita Kussmaul, MD as Consulting Physician (General Surgery) Tresten Pantoja, Virgie Dad, MD as Consulting Physician (Oncology) Gery Pray, MD as Consulting Physician (Radiation Oncology) Mauro Kaufmann, RN as Registered Nurse Rockwell Germany, RN as Registered Nurse Benson Norway, RN as Registered Nurse Rosemarie Ax, MD as Consulting Physician (Family Medicine) OTHER MD:   CHIEF COMPLAINT:  estrogen receptor positive breast cancer; bilateral   CURRENT TREATMENT: anastrozole   BREAST CANCER HISTORY: From the original intake note:  Margaret [name]'s primary care physician retired sometime ago and her health maintenance has not been up-to-date. She has been receiving medical care through the emergency room and was seen in March 2015 following a head injury, then in August 2015 for lancing of an abscess. In December 2015 she noticed that her left breast looked a little bit smaller than her right. She brought this to the attention of friends and family over the next several months but the general feeling was that most people are not perfectly symmetrical. By the summer of this year she noticed that her nipple on the left was "going in". More recently she started developing pain in the left breast. She was evaluated for this in the emergency room 04/09/2015 at which time a left breast exam showed a deformed left breast with a large hard mass encompassing most of the breast, with nipple retraction. There was no nipple discharge or bleeding and no palpable adenopathy.  The patient was referred to [facility] and on 04/13/2015 she underwent bilateral diagnostic mammography with tomosynthesis as well as bilateral breast ultrasound. The breast density was category B. In the right breast at  the 11:00 position there was a 1.3 cm irregular mass which by ultrasonography measured 1.3 cm.--In the left breast there was a 4 cm irregular mass at the 2:00 position associated with nipple retraction. There was a second, 1.7 cm area of architectural distortion at the 9:00 position. Both were palpable. By ultrasonography, the 4 cm irregular mass was noted, with a second mass measuring 2.5 cm by ultrasonography. Both axillae were benign.  On 04/19/2015 the patient underwent right breast upper outer quadrant biopsy, showing ([id number]) and invasive ductal carcinoma, grade 1, estrogen receptor 90% positive, progesterone receptor negative, with an MIB-1 of 5%, and no HER-2 amplification, the signals ratio being 1.26 and the number per cell 2.20.  On the same day, she underwent biopsy of the 2 left breast masses in question as well as a suspicious left axillary lymph node. All 3 biopsies showed invasive ductal carcinoma, grade 2, estrogen receptor 80-100% positive, progesterone receptor 80-100% positive, with the MIB-1 between 5 and 10%, and no HER-2 amplification, the signals ratio being between 1.05 and 1.13, and the number per cell between 2.10 and 2.25.  The patient's subsequent history is as detailed below   INTERVAL HISTORY: Margaret [name] returns today for follow-up and treatment of her estrogen receptor positive breast cancer.   The patient continues on anastrozole. She has some myalgia, mostly in her legs, which she attributes to anastrozole. She states that "she feels like she walked 25 miles." There is no relief from the gabapentin. 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   There is no bone density screening on file.  Since her last visit here, she underwent a chest xray on 05/22/2018  for a possible seizure showing Obscuration left lower chest due to overlapping soft tissues. No other abnormalities noted.   In addition, she also underwent a head CT without contrast on 05/22/2018 for a syncopal episode  following coughing showing a normal head CT for age. She states that her syncopal episode happened twice before she reached out to her PCP.    REVIEW OF SYSTEMS:  Margaret [name] has been working, usually on Saturdays and Sundays. She believes that she is doing well. She is curious about her potassium levels because she is cramping in her hands and legs despite drinking pomegranate juice. For exercise, she is walking. She also walks a lot at work. The patient denies unusual headaches, visual changes, nausea, vomiting, or dizziness. There has been no phlegm production or pleurisy. This been no change in bowel or bladder habits. The patient denies unexplained fatigue or unexplained weight loss, bleeding, rash, or fever. A detailed review of systems was otherwise noncontributory.    PAST MEDICAL HISTORY: Past Medical History:  Diagnosis Date  . Bilateral breast cancer ([place])   . Breast cancer ([place])   . Breast cancer of upper-outer quadrant of left female breast ([place]) 04/21/2015  . GERD (gastroesophageal reflux disease)   . Hypertension     PAST SURGICAL HISTORY: Past Surgical History:  Procedure Laterality Date  . ABDOMINAL HYSTERECTOMY  1990  . MASTECTOMY MODIFIED RADICAL Left 05/09/2015  . MASTECTOMY MODIFIED RADICAL Left 05/09/2015   Procedure: LEFT MODIFIED RADICAL MASTETCTOMY;  Surgeon: Autumn Messing III, MD;  Location: [facility];  Service: General;  Laterality: Left;  Marland Kitchen MASTECTOMY W/ SENTINEL NODE BIOPSY Right   . MASTECTOMY W/ SENTINEL NODE BIOPSY Right 05/09/2015   Procedure: RIGHT MASTECTOMY WITH RIGHT AXILLARY SENTINEL LYMPH NODE BIOPSY;  Surgeon: Autumn Messing III, MD;  Location: [facility];  Service: General;  Laterality: Right;  . PORTACATH PLACEMENT Right 09/01/2015   Procedure: INSERTION PORT-A-CATH;  Surgeon: Autumn Messing III, MD;  Location: [facility];  Service: General;  Laterality: Right;  . TUBAL LIGATION  1984    FAMILY HISTORY Family History  Problem Relation Age of Onset  .  Hypertension Mother   . Diabetes Mother     the patient has little information on her father. Her mother is living, currently age 57. She has one brother, 2 sisters. There is no history of breast or ovarian cancer in the family to her knowledge.   GYNECOLOGIC HISTORY:  No LMP recorded. Patient has had a hysterectomy.  menarche age 70, first live birth age 58. She is GX P4. She underwent a total abdominal hysterectomy with bilateral salpingo-oophorectomy at age 58. She did not take hormone replacement. She never used oral contraceptives.   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 She previously worked the same position at [employer] for 25+ years. She is single and lives alone  The patient's daughter, [name], works as a Programmer, applications, as does her daughter [name]. Daughter [name] works at [employer], as a call. All 3 live in [place]. The patient's son Marc Morgans Biskup's lives in [place]. He prepares sets for shows The patient has 4 grandchildren, no great grandchildren. She attends a [employer] in [place].    ADVANCED DIRECTIVES: Not in place. At the initial clinic visit the patient was given the appropriate forms to complete and notarize at her discretion. The patient intends to name her daughter Sampson Si as healthcare power  of attorney. She can be reached at [phone].   HEALTH MAINTENANCE: Social History   Tobacco Use  . Smoking status: Former Smoker    Packs/day: 0.10    Years: 28.00    Pack years: 2.80  . Smokeless tobacco: Never Used  Substance Use Topics  . Alcohol use: No    Alcohol/week: 0.0 standard drinks  . Drug use: No     Colonoscopy: Never  PAP: Status post hysterectomy  Bone density: Never  Lipid panel:  Allergies  Allergen Reactions  . Banana Hives    Tongue itching  . Latex Itching and Other (See Comments)    burning  . 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 Wheat Bran Hives    Current Outpatient Medications  Medication Sig Dispense Refill  . amLODipine (NORVASC) 5 MG tablet Take 1 tablet (5 mg total) by mouth daily. 90 tablet 6  . anastrozole (ARIMIDEX) 1 MG tablet Take 1 tablet (1 mg total) by mouth daily. 90 tablet 4  . busPIRone (BUSPAR) 5 MG tablet Take 1 tablet (5 mg total) by mouth daily as needed (anxiety). 30 tablet 3  . calcium-vitamin D (OSCAL WITH D) 500-200 MG-UNIT tablet Take 1 tablet by mouth 2 (two) times daily. 60 tablet 0  . diphenhydrAMINE (BENADRYL) 25 mg capsule Take 1 capsule (25 mg total) by mouth every 6 (six) hours as needed. (Patient taking differently: Take 25 mg by mouth every 6 (six) hours as needed for allergies. ) 100 capsule 3  . gabapentin (NEURONTIN) 300 MG capsule TAKE 2 CAPSULES (600 MG TOTAL) BY MOUTH 3 TIMES DAILY. (Patient taking differently: Take 600 mg by mouth 3 (three) times daily. ) 180 capsule 1  . ibuprofen (ADVIL,MOTRIN) 800 MG tablet TAKE 1 TABLET (800 MG TOTAL) BY MOUTH EVERY 8 HOURS AS NEEDED. (Patient taking differently: Take 800 mg by mouth every 8 (eight) hours as needed for moderate pain. ) 90 tablet 3  . losartan-hydrochlorothiazide (HYZAAR) 100-25 MG tablet TAKE 1 TABLET BY MOUTH DAILY. 90 tablet 3  . metFORMIN (GLUCOPHAGE) 500 MG tablet TAKE 1 TABLET BY MOUTH TWICE DAILY (Patient taking differently: Take 500 mg by mouth 2 (two) times daily. ) 60 tablet 2  . Multiple Vitamins-Minerals (CENTRUM SILVER 50+WOMEN PO) Take 1 tablet by mouth daily.    . pantoprazole (PROTONIX) 40 MG tablet TAKE 1 TABLET BY MOUTH ONCE DAILY (Patient taking differently: Take 40 mg by mouth daily. ) 30 tablet 3  . potassium chloride (MICRO-K) 10 MEQ CR capsule Take 2 capsules (20 mEq total) by mouth 2 (two) times daily. 180 capsule 4  . traMADol (ULTRAM) 50 MG tablet Take 1 tablet (50 mg total) by mouth daily as needed. 30 tablet 2  . traZODone (DESYREL) 50 MG tablet TAKE 1 TABLET  BY MOUTH AT BEDTIME. MAY INCREASE TO 2 TABLETS AFTER A FEW DAYS IF NEEDED 60 tablet 1  . valACYclovir (VALTREX) 1000 MG tablet TAKE 1 TABLET BY MOUTH 2 TIMES DAILY. 90 tablet 4  . venlafaxine XR (EFFEXOR-XR) 150 MG 24 hr capsule Take 1 capsule (150 mg total) by mouth daily with breakfast. 90 capsule 3   No current facility-administered medications for this visit.       OBJECTIVE: Morbidly obese African-American woman who appears stated age  58:   08/19/18 1330  BP: 134/84  Pulse: 87  Resp: 18  Temp: 98.6 F (37 C)  SpO2: 99%     Body  mass index is 40.61 kg/m.    ECOG FS:1 - Symptomatic but completely ambulatory    Sclerae unicteric, pupils round and equal No cervical or supraclavicular adenopathy Lungs no rales or rhonchi Heart regular rate and rhythm Abd soft, pes, nontender, positive bowel sounds Neuro: nonfocal, well oriented, appropriate affect Breasts: Status post bilateral mastectomies.  There is no evidence of disease recurrence.  Both axillae are benign.   LAB RESULTS:  CMP     Component Value Date/Time   NA 141 08/19/2018 1254   NA 142 07/24/2017 1407   K 3.1 (L) 08/19/2018 1254   K 3.3 (L) 07/24/2017 1407   CL 100 08/19/2018 1254   CO2 30 08/19/2018 1254   CO2 26 07/24/2017 1407   GLUCOSE 157 (H) 08/19/2018 1254   GLUCOSE 110 07/24/2017 1407   BUN 15 08/19/2018 1254   BUN 13.4 07/24/2017 1407   CREATININE 1.00 08/19/2018 1254   CREATININE 1.0 07/24/2017 1407   CALCIUM 10.2 08/19/2018 1254   CALCIUM 9.6 07/24/2017 1407   PROT 8.6 (H) 08/19/2018 1254   PROT 8.5 (H) 07/24/2017 1407   ALBUMIN 3.9 08/19/2018 1254   ALBUMIN 3.9 07/24/2017 1407   AST 20 08/19/2018 1254   AST 23 07/24/2017 1407   ALT 30 08/19/2018 1254   ALT 28 07/24/2017 1407   ALKPHOS 93 08/19/2018 1254   ALKPHOS 84 07/24/2017 1407   BILITOT 0.3 08/19/2018 1254   BILITOT 0.30 07/24/2017 1407   GFRNONAA >60 08/19/2018 1254   GFRAA >60 08/19/2018 1254    INo results found for:  SPEP, UPEP  Lab Results  Component Value Date   WBC 9.0 08/19/2018   NEUTROABS 5.3 08/19/2018   HGB 14.1 08/19/2018   HCT 41.8 08/19/2018   MCV 92.7 08/19/2018   PLT 207 08/19/2018      Chemistry      Component Value Date/Time   NA 141 08/19/2018 1254   NA 142 07/24/2017 1407   K 3.1 (L) 08/19/2018 1254   K 3.3 (L) 07/24/2017 1407   CL 100 08/19/2018 1254   CO2 30 08/19/2018 1254   CO2 26 07/24/2017 1407   BUN 15 08/19/2018 1254   BUN 13.4 07/24/2017 1407   CREATININE 1.00 08/19/2018 1254   CREATININE 1.0 07/24/2017 1407   GLU 157 (H) 08/17/2016 0818      Component Value Date/Time   CALCIUM 10.2 08/19/2018 1254   CALCIUM 9.6 07/24/2017 1407   ALKPHOS 93 08/19/2018 1254   ALKPHOS 84 07/24/2017 1407   AST 20 08/19/2018 1254   AST 23 07/24/2017 1407   ALT 30 08/19/2018 1254   ALT 28 07/24/2017 1407   BILITOT 0.3 08/19/2018 1254   BILITOT 0.30 07/24/2017 1407       No results found for: LABCA2  No components found for: LABCA125  No results for input(s): INR in the last 168 hours.  Urinalysis    Component Value Date/Time   COLORURINE YELLOW 06/29/2015 1030   APPEARANCEUR CLOUDY (A) 06/29/2015 1030   LABSPEC 1.019 06/29/2015 1030   PHURINE 7.0 06/29/2015 1030   GLUCOSEU NEGATIVE 06/29/2015 1030   HGBUR TRACE (A) 06/29/2015 1030   BILIRUBINUR NEGATIVE 06/29/2015 1030   KETONESUR NEGATIVE 06/29/2015 1030   PROTEINUR NEGATIVE 06/29/2015 1030   NITRITE NEGATIVE 06/29/2015 1030   LEUKOCYTESUR MODERATE (A) 06/29/2015 1030    STUDIES: No results found.   ASSESSMENT: 58 y.o. [place] woman with synchronous breast cancers, as follows  (a) status post right breast upper outer quadrant  biopsy 04/19/2015 for a clinical pT1c N0, stage IA invasive ductal carcinoma, grade 1, estrogen receptor positive, progesterone receptor negative, with an MIB-1 of 5%, and no HER-2 amplification  (b) status post left breast upper outer quadrant biopsy 2 and left axillary lymph  node biopsy 04/19/2015 for a clinical T3 N1, stage IIIA invasive ductal carcinoma, grade 1 or 2, estrogen receptor and progesterone receptor positive, HER-2 negative, with an MIB-1 between 5 and 10%  (1) status post bilateral mastectomies 05/09/2015, showing:   (a) on the right side, a pT2 pN0, stage IIA invasive ductal carcinoma, with negative margins and repeat HER-2 again negative    (b) on the left, a pT3 pN2, stage IIIA invasive ductal carcinoma, grade 2, with negative margins, and repeat HER-2 again negative  (2) Mammaprint from the left-sided tumor returned "luminal type, low risk", predicting a small benefit from chemotherapy with the important caveat that N2 disease was not included in the MINDACT study--patient opted for chemotherapy  (3) Oncotype from the right-sided tumor showed a score of 12, predicting an 8% risk of recurrence outside the breast within the next 10 years, if the patient's only systemic therapy was tamoxifen for 5 years. It also predicted no significant benefit from chemotherapy  (4) postmastectomy radiation to the left chest wall completed 08/15/2015  (5) adjuvant chemotherapy consisting of cyclophosphamide and docetaxel x4 completed on 11/21/15.  (5) anastrozole started 02/08/2016  (6) left upper extremity lymphedema  (7) signed for B-WELL study 02/08/2016  (8) participating in [place] trial as of August 2017  (a) randomized to palbociclib, starting October 2017  (b) completed 2 years of palbociclib October 2019, with no dose reductions necessary  PLAN: Margaret [name] is a little over 3 years out from definitive surgery for her breast cancer with no evidence of disease recurrence.  This is very favorable.  She is tolerating anastrozole well and the plan will be to continue that a minimum of 5 years  I encouraged her to improve on her exercise program.  She understands the diet is much more important than exercise in terms of weight control, but that exercise is very  important in terms of fitness.  She continues on the [place] trial.  She will be seen here again in 5 months.  She knows to call for any other issues that may develop before the next visit.    Ellakate Gonsalves, Virgie Dad, MD  08/19/18 1:56 PM Medical Oncology and Hematology [facility] [address] [place], [place] [address] Tel. [phone]    Fax. [phone]  I, Jacqualyn Posey am acting as a Education administrator for Chauncey Cruel, MD.   I, Lurline Del MD, have reviewed the above documentation for accuracy and completeness, and I agree with the above.

## 2018-08-19 ENCOUNTER — Inpatient Hospital Stay (HOSPITAL_BASED_OUTPATIENT_CLINIC_OR_DEPARTMENT_OTHER): Payer: Medicare Other | Admitting: Oncology

## 2018-08-19 ENCOUNTER — Inpatient Hospital Stay: Payer: Medicare Other | Attending: Oncology | Admitting: *Deleted

## 2018-08-19 VITALS — BP 134/84 | HR 87 | Temp 98.6°F | Resp 18 | Wt 265.1 lb

## 2018-08-19 DIAGNOSIS — Z170 Estrogen receptor positive status [ER+]: Secondary | ICD-10-CM | POA: Insufficient documentation

## 2018-08-19 DIAGNOSIS — C50411 Malignant neoplasm of upper-outer quadrant of right female breast: Secondary | ICD-10-CM | POA: Diagnosis not present

## 2018-08-19 DIAGNOSIS — C50412 Malignant neoplasm of upper-outer quadrant of left female breast: Secondary | ICD-10-CM

## 2018-08-19 DIAGNOSIS — C50211 Malignant neoplasm of upper-inner quadrant of right female breast: Secondary | ICD-10-CM

## 2018-08-19 DIAGNOSIS — Z006 Encounter for examination for normal comparison and control in clinical research program: Secondary | ICD-10-CM

## 2018-08-19 DIAGNOSIS — Z79811 Long term (current) use of aromatase inhibitors: Secondary | ICD-10-CM | POA: Insufficient documentation

## 2018-08-19 DIAGNOSIS — Z923 Personal history of irradiation: Secondary | ICD-10-CM

## 2018-08-19 DIAGNOSIS — I890 Lymphedema, not elsewhere classified: Secondary | ICD-10-CM

## 2018-08-19 DIAGNOSIS — Z9013 Acquired absence of bilateral breasts and nipples: Secondary | ICD-10-CM | POA: Insufficient documentation

## 2018-08-19 DIAGNOSIS — Z9221 Personal history of antineoplastic chemotherapy: Secondary | ICD-10-CM | POA: Insufficient documentation

## 2018-08-19 LAB — CMP (CANCER CENTER ONLY)
ALT: 30 U/L (ref 0–44)
AST: 20 U/L (ref 15–41)
Albumin: 3.9 g/dL (ref 3.5–5.0)
Alkaline Phosphatase: 93 U/L (ref 38–126)
Anion gap: 11 (ref 5–15)
BUN: 15 mg/dL (ref 6–20)
CO2: 30 mmol/L (ref 22–32)
Calcium: 10.2 mg/dL (ref 8.9–10.3)
Chloride: 100 mmol/L (ref 98–111)
Creatinine: 1 mg/dL (ref 0.44–1.00)
GFR, Est AFR Am: >60 mL/min (ref >60)
GFR, Estimated: >60 mL/min (ref >60)
Glucose, Bld: 157 mg/dL — ABNORMAL HIGH (ref 70–99)
Potassium: 3.1 mmol/L — ABNORMAL LOW (ref 3.5–5.1)
Sodium: 141 mmol/L (ref 135–145)
Total Bilirubin: 0.3 mg/dL (ref 0.3–1.2)
Total Protein: 8.6 g/dL — ABNORMAL HIGH (ref 6.5–8.1)

## 2018-08-19 LAB — CBC WITH DIFFERENTIAL (CANCER CENTER ONLY)
Abs Immature Granulocytes: 0.05 10*3/uL (ref 0.00–0.07)
Basophils Absolute: 0.1 10*3/uL (ref 0.0–0.1)
Basophils Relative: 1 %
Eosinophils Absolute: 0.2 10*3/uL (ref 0.0–0.5)
Eosinophils Relative: 2 %
HCT: 41.8 % (ref 36.0–46.0)
Hemoglobin: 14.1 g/dL (ref 12.0–15.0)
Immature Granulocytes: 1 %
Lymphocytes Relative: 28 %
Lymphs Abs: 2.6 10*3/uL (ref 0.7–4.0)
MCH: 31.3 pg (ref 26.0–34.0)
MCHC: 33.7 g/dL (ref 30.0–36.0)
MCV: 92.7 fL (ref 80.0–100.0)
Monocytes Absolute: 0.9 10*3/uL (ref 0.1–1.0)
Monocytes Relative: 10 %
Neutro Abs: 5.3 10*3/uL (ref 1.7–7.7)
Neutrophils Relative %: 58 %
Platelet Count: 207 10*3/uL (ref 150–400)
RBC: 4.51 MIL/uL (ref 3.87–5.11)
RDW: 13.1 % (ref 11.5–15.5)
WBC Count: 9 10*3/uL (ref 4.0–10.5)
nRBC: 0 % (ref 0.0–0.2)

## 2018-08-19 MED FILL — traZODone HCL 50 MG TABS: 50 | 30 days supply | Qty: 60 | Fill #0

## 2018-08-19 MED FILL — valACYclovir HCL 1 GM TABS: 1 | 45 days supply | Qty: 90 | Fill #2

## 2018-08-19 MED FILL — traMADol HCL 50 MG TABS: 50 | 30 days supply | Qty: 30 | Fill #1

## 2018-08-19 NOTE — Research (Signed)
[facility] [id number] [facility]- Month 30 visit  Follow-up questionnaire was completely independently by the patient. Patient denies any fractures, sprains, tendon/ligament injuries or orthopedic surgies since the Month 24 visit. Weight, waist and hip circumference measurements were obtained per protocol and the instructions in the BWEL Weight and Height Protocol document. Informed patient that her next research appointment would be in approximately 5 months. Patient denied any other concerns. Thanked patient for her participation in the study.   Johney Maine RN, BSN Clinical Research  08/19/2018 13:25

## 2018-08-20 ENCOUNTER — Telehealth: Payer: Self-pay | Admitting: Oncology

## 2018-09-02 ENCOUNTER — Ambulatory Visit (AMBULATORY_SURGERY_CENTER): Payer: Self-pay

## 2018-09-02 ENCOUNTER — Other Ambulatory Visit: Payer: Self-pay | Admitting: Oncology

## 2018-09-02 VITALS — Ht 69.0 in | Wt 274.0 lb

## 2018-09-02 DIAGNOSIS — Z1211 Encounter for screening for malignant neoplasm of colon: Secondary | ICD-10-CM

## 2018-09-02 MED ORDER — NA SULFATE-K SULFATE-MG SULF 17.5-3.13-1.6 GM/177ML PO SOLN: 1.0000 | Freq: Once | ORAL | 0 refills | Status: AC

## 2018-09-02 MED FILL — IBUPROFEN 800 MG TAB: 800 | 30 days supply | Qty: 90 | Fill #0

## 2018-09-02 MED FILL — SUPREP BOWEL PREP KIT: 17.5-3.13-1 | 2 days supply | Qty: 354 | Fill #0

## 2018-09-02 MED FILL — LOSARTAN-HCTZ 100-25 MG TAB: 100-25 | 30 days supply | Qty: 30 | Fill #4

## 2018-09-02 NOTE — Progress Notes (Signed)
Denies allergies to eggs or soy products. Denies complication of anesthesia or sedation. Denies use of weight loss medication. Denies use of O2.   Emmi instructions declined.  

## 2018-09-16 ENCOUNTER — Other Ambulatory Visit: Payer: Self-pay | Admitting: Oncology

## 2018-09-16 ENCOUNTER — Ambulatory Visit (AMBULATORY_SURGERY_CENTER): Payer: Medicare Other | Admitting: Gastroenterology

## 2018-09-16 ENCOUNTER — Encounter: Payer: Self-pay | Admitting: Gastroenterology

## 2018-09-16 VITALS — BP 138/80 | HR 93 | Temp 98.0°F | Resp 14 | Ht 69.0 in | Wt 274.0 lb

## 2018-09-16 DIAGNOSIS — Z170 Estrogen receptor positive status [ER+]: Principal | ICD-10-CM

## 2018-09-16 DIAGNOSIS — Z1211 Encounter for screening for malignant neoplasm of colon: Secondary | ICD-10-CM

## 2018-09-16 DIAGNOSIS — C50412 Malignant neoplasm of upper-outer quadrant of left female breast: Secondary | ICD-10-CM

## 2018-09-16 MED ORDER — SODIUM CHLORIDE 0.9 % IV SOLN: 500.0000 mL | Freq: Once | INTRAVENOUS | Status: DC

## 2018-09-16 MED FILL — metFORMIN HCL 500 MG TABS: 500 | 30 days supply | Qty: 60 | Fill #2

## 2018-09-16 MED FILL — traZODone HCL 50 MG TABS: 50 | 30 days supply | Qty: 60 | Fill #1

## 2018-09-16 MED FILL — GABAPENTIN 300 MG CAPSULE: 300 | 30 days supply | Qty: 180 | Fill #1

## 2018-09-16 MED FILL — traMADol HCL 50 MG TABS: 50 | 30 days supply | Qty: 30 | Fill #2

## 2018-09-16 MED FILL — POTASSIUM CL ER 10 MEQ CAP: 10 | 45 days supply | Qty: 180 | Fill #2

## 2018-09-16 MED FILL — busPIRone HCL 5 MG TABS: 5 | 30 days supply | Qty: 30 | Fill #1

## 2018-09-16 MED FILL — ANASTROZOLE 1 MG TABLET: 1 | 30 days supply | Qty: 30 | Fill #0

## 2018-09-16 NOTE — Patient Instructions (Signed)
YOU HAD AN ENDOSCOPIC PROCEDURE TODAY AT THE [facility]:   Refer to the procedure report that was given to you for any specific questions about what was found during the examination.  If the procedure report does not answer your questions, please call your gastroenterologist to clarify.  If you requested that your care partner not be given the details of your procedure findings, then the procedure report has been included in a sealed envelope for you to review at your convenience later.  YOU SHOULD EXPECT: Some feelings of bloating in the abdomen. Passage of more gas than usual.  Walking can help get rid of the air that was put into your GI tract during the procedure and reduce the bloating. If you had a lower endoscopy (such as a colonoscopy or flexible sigmoidoscopy) you may notice spotting of blood in your stool or on the toilet paper. If you underwent a bowel prep for your procedure, you may not have a normal bowel movement for a few days.  Please Note:  You might notice some irritation and congestion in your nose or some drainage.  This is from the oxygen used during your procedure.  There is no need for concern and it should clear up in a day or so.  SYMPTOMS TO REPORT IMMEDIATELY:   Following lower endoscopy (colonoscopy or flexible sigmoidoscopy):  Excessive amounts of blood in the stool  Significant tenderness or worsening of abdominal pains  Swelling of the abdomen that is new, acute  Fever of 100F or higher  For urgent or emergent issues, a gastroenterologist can be reached at any hour by calling [phone].   DIET:  We do recommend a small meal at first, but then you may proceed to your regular diet.  Drink plenty of fluids but you should avoid alcoholic beverages for 24 hours.  ACTIVITY:  You should plan to take it easy for the rest of today and you should NOT DRIVE or use heavy machinery until tomorrow (because of the sedation medicines used during the test).     FOLLOW UP: Our staff will call the number listed on your records the next business day following your procedure to check on you and address any questions or concerns that you may have regarding the information given to you following your procedure. If we do not reach you, we will leave a message.  However, if you are feeling well and you are not experiencing any problems, there is no need to return our call.  We will assume that you have returned to your regular daily activities without incident.  If any biopsies were taken you will be contacted by phone or by letter within the next 1-3 weeks.  Please call us at [phone] if you have not heard about the biopsies in 3 weeks.    SIGNATURES/CONFIDENTIALITY: You and/or your care partner have signed paperwork which will be entered into your electronic medical record.  These signatures attest to the fact that that the information above on your After Visit Summary has been reviewed and is understood.  Full responsibility of the confidentiality of this discharge information lies with you and/or your care-partner. 

## 2018-09-16 NOTE — Op Note (Addendum)
[facility] Patient Name: Margaret [name] Procedure Date: 09/16/2018 10:41 AM MRN: [id number] Endoscopist: [place]. Loletha Carrow , MD Age: 58 Referring MD:  Date of Birth: [birth year] Gender: Female Account #: [id number] Procedure:                Colonoscopy Indications:              Screening for colorectal malignant neoplasm, This                            is the patient's first colonoscopy Medicines:                Monitored Anesthesia Care Procedure:                Pre-Anesthesia Assessment:                           - Prior to the procedure, a History and Physical                            was performed, and patient medications and                            allergies were reviewed. The patient's tolerance of                            previous anesthesia was also reviewed. The risks                            and benefits of the procedure and the sedation                            options and risks were discussed with the patient.                            All questions were answered, and informed consent                            was obtained. Prior Anticoagulants: The patient has                            taken no previous anticoagulant or antiplatelet                            agents. ASA Grade Assessment: III - A patient with                            severe systemic disease. After reviewing the risks                            and benefits, the patient was deemed in                            satisfactory condition to undergo the procedure.  After obtaining informed consent, the colonoscope                            was passed under direct vision. Throughout the                            procedure, the patient's blood pressure, pulse, and                            oxygen saturations were monitored continuously. The                            Colonoscope was introduced through the anus and                            advanced to the the  cecum, identified by                            appendiceal orifice and ileocecal valve. The                            colonoscopy was performed without difficulty. The                            patient tolerated the procedure well. The quality                            of the bowel preparation was good. , after lavage Scope In: 10:53:20 AM Scope Out: 46:96:29 AM Scope Withdrawal Time: 0 hours 20 minutes 47 seconds  Total Procedure Duration: 0 hours 23 minutes 26 seconds  Findings:                 The perianal and digital rectal examinations were                            normal.                           There was a small lipoma, in the ascending colon.                           The exam was otherwise without abnormality on                            direct and retroflexion views. Complications:            No immediate complications. Estimated Blood Loss:     Estimated blood loss: none. Impression:               - Small lipoma in the ascending colon.                           - The examination was otherwise normal on direct  and retroflexion views.                           - No specimens collected. Recommendation:           - Patient has a contact number available for                            emergencies. The signs and symptoms of potential                            delayed complications were discussed with the                            patient. Return to normal activities tomorrow.                            Written discharge instructions were provided to the                            patient.                           - Resume previous diet.                           - Continue present medications.                           - Repeat colonoscopy in 10 years for screening                            purposes. Ulrich Soules L. Loletha Carrow, MD 09/16/2018 11:20:29 AM This report has been signed electronically.

## 2018-09-16 NOTE — Progress Notes (Signed)
Pt's states no medical or surgical changes since previsit or office visit. 

## 2018-09-16 NOTE — Progress Notes (Signed)
Called to room to assist during endoscopic procedure.  Patient ID and intended procedure confirmed with present staff. Received instructions for my participation in the procedure from the performing physician.  

## 2018-09-16 NOTE — Progress Notes (Signed)
PT taken to PACU. Monitors in place. VSS. Report given to RN. 

## 2018-09-17 ENCOUNTER — Telehealth: Payer: Self-pay

## 2018-09-17 NOTE — Telephone Encounter (Signed)
  Follow up Call-  Call back number 09/16/2018  Post procedure Call Back phone  # [phone]  Permission to leave phone message Yes  Some recent data might be hidden     Patient questions:  Do you have a fever, pain , or abdominal swelling? No. Pain Score  0 *  Have you tolerated food without any problems? Yes.    Have you been able to return to your normal activities? Yes.    Do you have any questions about your discharge instructions: Diet   No. Medications  No. Follow up visit  No.  Do you have questions or concerns about your Care? No.  Actions: * If pain score is 4 or above: No action needed, pain <4.

## 2018-09-29 MED FILL — LOSARTAN-HCTZ 100-25 MG TAB: 100-25 | 30 days supply | Qty: 30 | Fill #5

## 2018-09-29 MED FILL — IBUPROFEN 800 MG TAB: 800 | 30 days supply | Qty: 90 | Fill #1

## 2018-10-03 MED FILL — PANTOPRAZOLE SOD DR 40 MG T: 40 | 30 days supply | Qty: 30 | Fill #0

## 2018-10-08 DIAGNOSIS — C50911 Malignant neoplasm of unspecified site of right female breast: Secondary | ICD-10-CM | POA: Diagnosis not present

## 2018-10-08 DIAGNOSIS — C50912 Malignant neoplasm of unspecified site of left female breast: Secondary | ICD-10-CM | POA: Diagnosis not present

## 2018-10-14 DIAGNOSIS — C50911 Malignant neoplasm of unspecified site of right female breast: Secondary | ICD-10-CM | POA: Diagnosis not present

## 2018-10-14 DIAGNOSIS — C50912 Malignant neoplasm of unspecified site of left female breast: Secondary | ICD-10-CM | POA: Diagnosis not present

## 2018-10-15 DIAGNOSIS — C50912 Malignant neoplasm of unspecified site of left female breast: Secondary | ICD-10-CM | POA: Diagnosis not present

## 2018-10-15 DIAGNOSIS — C50911 Malignant neoplasm of unspecified site of right female breast: Secondary | ICD-10-CM | POA: Diagnosis not present

## 2018-10-20 ENCOUNTER — Other Ambulatory Visit: Payer: Self-pay | Admitting: Adult Health

## 2018-10-20 ENCOUNTER — Other Ambulatory Visit: Payer: Self-pay | Admitting: Internal Medicine

## 2018-10-20 ENCOUNTER — Other Ambulatory Visit: Payer: Self-pay | Admitting: Oncology

## 2018-10-20 DIAGNOSIS — Z170 Estrogen receptor positive status [ER+]: Principal | ICD-10-CM

## 2018-10-20 DIAGNOSIS — C50012 Malignant neoplasm of nipple and areola, left female breast: Secondary | ICD-10-CM

## 2018-10-20 DIAGNOSIS — G629 Polyneuropathy, unspecified: Secondary | ICD-10-CM

## 2018-10-20 DIAGNOSIS — C50011 Malignant neoplasm of nipple and areola, right female breast: Secondary | ICD-10-CM

## 2018-10-20 DIAGNOSIS — C50211 Malignant neoplasm of upper-inner quadrant of right female breast: Secondary | ICD-10-CM

## 2018-10-20 DIAGNOSIS — C50412 Malignant neoplasm of upper-outer quadrant of left female breast: Secondary | ICD-10-CM

## 2018-10-20 DIAGNOSIS — E119 Type 2 diabetes mellitus without complications: Secondary | ICD-10-CM

## 2018-10-20 DIAGNOSIS — G4720 Circadian rhythm sleep disorder, unspecified type: Secondary | ICD-10-CM

## 2018-10-20 MED FILL — valACYclovir HCL 1 GM TABS: 1 | 45 days supply | Qty: 90 | Fill #3

## 2018-10-20 MED FILL — VENLAFAXINE HCL ER 150 MG C: 150 | 90 days supply | Qty: 90 | Fill #2

## 2018-10-20 MED FILL — busPIRone HCL 5 MG TABS: 5 | 30 days supply | Qty: 30 | Fill #2

## 2018-10-20 MED FILL — ANASTROZOLE 1 MG TABLET: 1 | 30 days supply | Qty: 30 | Fill #1

## 2018-10-21 MED FILL — AMLODIPINE BESYLATE 5 MG TA: 5 | 90 days supply | Qty: 90 | Fill #0

## 2018-10-21 MED FILL — GABAPENTIN 300 MG CAPSULE: 300 | 30 days supply | Qty: 180 | Fill #0

## 2018-10-21 MED FILL — traZODone HCL 50 MG TABS: 50 | 30 days supply | Qty: 60 | Fill #0

## 2018-10-21 MED FILL — traMADol HCL 50 MG TABS: 50 | 30 days supply | Qty: 30 | Fill #0

## 2018-10-21 NOTE — Telephone Encounter (Signed)
Control database checked last refill: 09/16/2018 LOV: cpe 07/15/2018 YEB:XIDH

## 2018-11-01 ENCOUNTER — Other Ambulatory Visit: Payer: Self-pay | Admitting: Adult Health

## 2018-11-01 DIAGNOSIS — E119 Type 2 diabetes mellitus without complications: Secondary | ICD-10-CM

## 2018-11-03 ENCOUNTER — Other Ambulatory Visit: Payer: Self-pay | Admitting: Adult Health

## 2018-11-03 DIAGNOSIS — C50211 Malignant neoplasm of upper-inner quadrant of right female breast: Secondary | ICD-10-CM

## 2018-11-03 DIAGNOSIS — Z170 Estrogen receptor positive status [ER+]: Principal | ICD-10-CM

## 2018-11-03 DIAGNOSIS — E1169 Type 2 diabetes mellitus with other specified complication: Secondary | ICD-10-CM

## 2018-11-03 MED FILL — metFORMIN HCL 500 MG TABS: 500 | 30 days supply | Qty: 60 | Fill #0

## 2018-11-06 ENCOUNTER — Other Ambulatory Visit: Payer: Self-pay

## 2018-11-06 MED FILL — POTASSIUM CL ER 10 MEQ CAP: 10 | 45 days supply | Qty: 180 | Fill #3

## 2018-11-06 MED FILL — PANTOPRAZOLE SOD DR 40 MG T: 40 | 30 days supply | Qty: 30 | Fill #1

## 2018-11-06 MED FILL — busPIRone HCL 5 MG TABS: 5 | 30 days supply | Qty: 30 | Fill #3

## 2018-11-06 MED FILL — ANASTROZOLE 1 MG TABLET: 1 | 30 days supply | Qty: 30 | Fill #2

## 2018-11-06 MED FILL — LOSARTAN-HCTZ 100-25 MG TAB: 100-25 | 30 days supply | Qty: 30 | Fill #6

## 2018-11-06 NOTE — Patient Outreach (Signed)
[facility]) Care Management  11/06/2018  Margaret [name] Dec 21, 1960 [id number]   Medication Adherence call to Margaret [name] [place] Identifiers Verify spoke with patient she said she will call  pharmacy and order this medication along all of her other medications she needs, patient is still taking 1 tablet daily on this medication . Margaret [name] is showing past due under [place].   [place] Management Direct Dial [phone]  Fax [phone] [email or web address]

## 2018-11-13 MED FILL — IBUPROFEN 800 MG TAB: 800 | 30 days supply | Qty: 90 | Fill #2

## 2018-11-20 ENCOUNTER — Telehealth: Payer: Self-pay | Admitting: *Deleted

## 2018-11-20 NOTE — Telephone Encounter (Signed)
11/20/2018 Left voice mail message for patient in regards to research study (AFT-05 PALLAS) follow up to let her know I was calling to check on her status. Asked patient to return my call at [phone], otherwise I will attempt to reach her by phone tomorrow. Cindy S. Brigitte Pulse BSN, RN, CCRP 11/20/2018 12:16 PM

## 2018-11-21 ENCOUNTER — Encounter: Payer: Self-pay | Admitting: *Deleted

## 2018-11-21 DIAGNOSIS — C50412 Malignant neoplasm of upper-outer quadrant of left female breast: Secondary | ICD-10-CM

## 2018-11-21 DIAGNOSIS — Z170 Estrogen receptor positive status [ER+]: Principal | ICD-10-CM

## 2018-11-21 NOTE — Research (Signed)
11/21/2018 Research - [facility] AFT-05 PALLAS - Follow-Up Phase I Month 85 Spoke with patient by phone today for Follow-up Phase I 30 month telephone call. Patient continues anti-hormone therapy with anastrozole, and she has not begun any new anti-cancer medications. She has experienced no serious adverse events since study completion. At present, there is no known evidence of breast cancer recurrence. Patient is scheduled to return to clinic in June 2020 in follow-up for the Alliance [id number] BWEL study. Cindy S. Brigitte Pulse BSN, RN, [place] 11/21/2018 2:17 PM

## 2018-12-09 ENCOUNTER — Other Ambulatory Visit: Payer: Self-pay | Admitting: Internal Medicine

## 2018-12-09 MED FILL — metFORMIN HCL 500 MG TABS: 500 | 30 days supply | Qty: 60 | Fill #1

## 2018-12-09 MED FILL — busPIRone HCL 5 MG TABS: 5 | 30 days supply | Qty: 30 | Fill #0

## 2018-12-09 MED FILL — valACYclovir HCL 1 GM TABS: 1 | 45 days supply | Qty: 90 | Fill #4

## 2018-12-09 MED FILL — LOSARTAN-HCTZ 100-25 MG TAB: 100-25 | 30 days supply | Qty: 30 | Fill #7

## 2018-12-18 MED FILL — ANASTROZOLE 1 MG TABLET: 1 | 30 days supply | Qty: 30 | Fill #3

## 2018-12-18 MED FILL — PANTOPRAZOLE SOD DR 40 MG T: 40 | 30 days supply | Qty: 30 | Fill #2

## 2018-12-18 MED FILL — IBUPROFEN 800 MG TAB: 800 | 30 days supply | Qty: 90 | Fill #3

## 2019-01-05 ENCOUNTER — Telehealth: Payer: Self-pay | Admitting: *Deleted

## 2019-01-05 NOTE — Telephone Encounter (Signed)
01/05/2019 Spoke with patient by phone to discuss the possibility of moving her June 15th appointment to October. The Alliance group has allowed for extension of follow-up visits fpr the BWEL study patient by 3 months (original due date was July 17th), due to the COVID-19 pandemic. In addition, this will allow visits to sync with her expected PALLAS study follow up visit due in October. Patient states that other than a need for checking her potassium level, which has been done by Dr. Jana Hakim, she feels this would be okay. Dr. Jana Hakim has been contacted via In Basket regarding the potential appointment change. Also inquired about patient's follow-up with PCP, Dr. Sharlet Salina and whether she might have potassium level checked at that office. Thanked patient for her flexibility and noted that she may receive a follow up call from scheduling if it is determined that we can move her appointment to October. Cindy S. Brigitte Pulse BSN, RN, [facility] 01/05/2019 3:42 PM

## 2019-01-06 ENCOUNTER — Other Ambulatory Visit: Payer: Self-pay | Admitting: Oncology

## 2019-01-06 DIAGNOSIS — C50412 Malignant neoplasm of upper-outer quadrant of left female breast: Secondary | ICD-10-CM

## 2019-01-06 MED FILL — IBUPROFEN 800 MG TAB: 800 | 30 days supply | Qty: 90 | Fill #1

## 2019-01-06 MED FILL — GABAPENTIN 300 MG CAPSULE: 300 | 30 days supply | Qty: 180 | Fill #1

## 2019-01-06 MED FILL — busPIRone HCL 5 MG TABS: 5 | 30 days supply | Qty: 30 | Fill #1

## 2019-01-06 MED FILL — ANASTROZOLE 1 MG TABLET: 1 | 30 days supply | Qty: 30 | Fill #4

## 2019-01-06 MED FILL — AMLODIPINE BESYLATE 5 MG TA: 5 | 90 days supply | Qty: 90 | Fill #1

## 2019-01-06 MED FILL — metFORMIN HCL 500 MG TABS: 500 | 30 days supply | Qty: 60 | Fill #2

## 2019-01-06 MED FILL — PANTOPRAZOLE SOD DR 40 MG T: 40 | 30 days supply | Qty: 30 | Fill #0

## 2019-01-06 MED FILL — POTASSIUM CL ER 10 MEQ CAP: 10 | 45 days supply | Qty: 180 | Fill #4

## 2019-01-07 ENCOUNTER — Telehealth: Payer: Self-pay | Admitting: Oncology

## 2019-01-07 NOTE — Telephone Encounter (Signed)
R/s appt per 6/3 sch message - unable to reach pt . Left message with appt date and time

## 2019-01-08 MED FILL — LOSARTAN-HCTZ 100-25 MG TAB: 100-25 | 30 days supply | Qty: 30 | Fill #8

## 2019-01-09 ENCOUNTER — Telehealth: Payer: Self-pay | Admitting: Oncology

## 2019-01-09 NOTE — Telephone Encounter (Signed)
Spoke with pt - scheduled lab appt per 6/5 sch message - pt is aware of appt date and time

## 2019-01-12 ENCOUNTER — Other Ambulatory Visit: Payer: Medicare Other

## 2019-01-19 ENCOUNTER — Other Ambulatory Visit: Payer: Medicare Other

## 2019-01-19 ENCOUNTER — Encounter: Payer: Self-pay | Admitting: *Deleted

## 2019-01-19 ENCOUNTER — Ambulatory Visit: Payer: Medicare Other | Admitting: Oncology

## 2019-01-19 ENCOUNTER — Telehealth: Payer: Self-pay | Admitting: *Deleted

## 2019-01-19 DIAGNOSIS — C50412 Malignant neoplasm of upper-outer quadrant of left female breast: Secondary | ICD-10-CM

## 2019-01-19 DIAGNOSIS — Z170 Estrogen receptor positive status [ER+]: Secondary | ICD-10-CM

## 2019-01-19 NOTE — Telephone Encounter (Signed)
01/19/2019 Spoke with patient by phone today to inform her of the Interim Analysis 2 findings for the AFT-05 PALLAS study, showing that "there was very little chance that the addition of palbociclib to endocrine therapy significantly reduced cancer recurrence, compared to endocrine therapy alone." In addition, the patient was informed that "there was no sign of unexpected toxicity or harm to people taking palbociclib with endocrine therapy". Explained to patient that the new study information does not alter her current participation in the Follow-up phase. Explained to patient that endocrine therapy will continue in the follow-up phase, as well as ongoing follow-up assessments. Patient is in agreement with this plan. Notified patient that she will be receiving a patient letter in the mail regarding the study findings to date. Patient appreciated being given this new information. Thanked patient for her participation and for her contributions to the trial. She is encouraged to contact our office for any questions or concerns that she might have, following receipt of the patient letter.  Also discussed changes to her appointments related to our recent discussion pertaining to appointments for the Alliance [id number] BWEL study and the AFT-05 PALLAS study follow-ups. Patient is aware that she has appointments scheduled for September 9th. Also discussed lab appointment that was requested by Dr. Virgie Dad nurse, however patient says she was told about the appointment but she forgot it on the scheduled day. Explained to patient that I would notify Dr. Virgie Dad nurse, although patient states she could "just wait until September". Will also include Dr. Sharlet Salina in the correspondence so she is aware of the actions being taken.  Cindy S. Brigitte Pulse BSN, RN, [facility] 01/19/2019 4:39 PM

## 2019-01-19 NOTE — Research (Signed)
01/19/2019 See Telephone Call encounter for details of patient notification of the AFT-05 PALLAS study Second Interim Analysis results. Cindy S. Brigitte Pulse BSN, RN, [place] 01/19/2019 4:44 PM

## 2019-01-19 NOTE — Telephone Encounter (Signed)
01/19/2019 Left voice mail message for patient requesting a return call at my direct number [phone] to discuss new study information. Also noted that I would be in the office today and tomorrow and will attempt additional phone calls during that time. Cindy S. Brigitte Pulse BSN, RN, [place] 01/19/2019 1:53 PM

## 2019-01-26 ENCOUNTER — Other Ambulatory Visit: Payer: Self-pay | Admitting: Oncology

## 2019-01-27 ENCOUNTER — Other Ambulatory Visit: Payer: Self-pay | Admitting: *Deleted

## 2019-01-27 MED ORDER — VALACYCLOVIR HCL 1 G PO TABS: 1000.0000 mg | ORAL_TABLET | Freq: Two times a day (BID) | ORAL | 4 refills | Status: DC

## 2019-01-27 MED FILL — valACYclovir HCL 1 GM TABS: 1 | 45 days supply | Qty: 90 | Fill #0

## 2019-02-03 ENCOUNTER — Other Ambulatory Visit: Payer: Self-pay | Admitting: Oncology

## 2019-02-03 MED FILL — VENLAFAXINE HCL ER 150 MG C: 150 | 90 days supply | Qty: 90 | Fill #3

## 2019-02-03 MED FILL — ANASTROZOLE 1 MG TABLET: 1 | 90 days supply | Qty: 90 | Fill #0

## 2019-02-03 MED FILL — PANTOPRAZOLE SOD DR 40 MG T: 40 | 30 days supply | Qty: 30 | Fill #1

## 2019-02-03 MED FILL — traZODone HCL 50 MG TABS: 50 | 30 days supply | Qty: 60 | Fill #1

## 2019-02-03 MED FILL — LOSARTAN-HCTZ 100-25 MG TAB: 100-25 | 30 days supply | Qty: 30 | Fill #9

## 2019-02-04 ENCOUNTER — Other Ambulatory Visit: Payer: Self-pay

## 2019-02-04 MED ORDER — ANASTROZOLE 1 MG PO TABS: 1.0000 mg | ORAL_TABLET | Freq: Every day | ORAL | 4 refills | Status: DC

## 2019-02-05 ENCOUNTER — Telehealth: Payer: Self-pay | Admitting: Oncology

## 2019-02-05 NOTE — Telephone Encounter (Signed)
Had to correct medical records for Release [id number]. Faxed corrected medical records to [employer] @ [phone]. Had to pull records from charts as opposed to the Release module.

## 2019-02-05 NOTE — Telephone Encounter (Signed)
Faxed medical records to [employer] for the last 5 years. Release VQ#[id number]

## 2019-02-20 ENCOUNTER — Other Ambulatory Visit: Payer: Self-pay | Admitting: Adult Health

## 2019-02-20 ENCOUNTER — Other Ambulatory Visit: Payer: Self-pay | Admitting: Oncology

## 2019-02-20 DIAGNOSIS — E119 Type 2 diabetes mellitus without complications: Secondary | ICD-10-CM

## 2019-02-20 MED FILL — IBUPROFEN 800 MG TAB: 800 | 30 days supply | Qty: 90 | Fill #2

## 2019-02-20 MED FILL — busPIRone HCL 5 MG TABS: 5 | 30 days supply | Qty: 30 | Fill #2

## 2019-02-21 MED FILL — ANASTROZOLE 1 MG TABLET: 1 | 90 days supply | Qty: 90 | Fill #0

## 2019-02-23 MED FILL — POTASSIUM CL ER 10 MEQ CAP: 10 | 45 days supply | Qty: 180 | Fill #0

## 2019-02-23 NOTE — Telephone Encounter (Signed)
Patient needs to come in for labs, or see PCP for labs.  Her hemoglobin a1c was 7.8 when last checked.  It hasn't been checked since.  Does she have appointment?

## 2019-02-24 ENCOUNTER — Other Ambulatory Visit: Payer: Self-pay

## 2019-02-24 ENCOUNTER — Other Ambulatory Visit: Payer: Self-pay | Admitting: *Deleted

## 2019-02-24 NOTE — Patient Outreach (Signed)
[facility]) Care Management  02/24/2019  Margaret [name] 1961/03/19 [id number]   Medication Adherence call to [name] Compliant Voice message left with a call back number. [name] is showing past due on Metformin 500 mg under [place].   [place] Management Direct Dial [phone]  Fax [phone] [email or web address]

## 2019-02-26 MED FILL — PANTOPRAZOLE SOD DR 40 MG T: 40 | 30 days supply | Qty: 30 | Fill #2

## 2019-03-03 ENCOUNTER — Other Ambulatory Visit: Payer: Self-pay | Admitting: *Deleted

## 2019-03-03 DIAGNOSIS — E119 Type 2 diabetes mellitus without complications: Secondary | ICD-10-CM

## 2019-03-03 MED ORDER — METFORMIN HCL 500 MG PO TABS: 500.0000 mg | ORAL_TABLET | Freq: Two times a day (BID) | ORAL | 2 refills | Status: DC

## 2019-03-03 MED FILL — metFORMIN HCL 500 MG TABS: 500 | 30 days supply | Qty: 60 | Fill #0

## 2019-03-04 ENCOUNTER — Other Ambulatory Visit: Payer: Self-pay | Admitting: Oncology

## 2019-03-04 MED FILL — POTASSIUM CL ER 10 MEQ CAP: 10 | 45 days supply | Qty: 180 | Fill #0

## 2019-03-04 MED FILL — LOSARTAN-HCTZ 100-25 MG TAB: 100-25 | 30 days supply | Qty: 30 | Fill #0

## 2019-03-25 ENCOUNTER — Other Ambulatory Visit: Payer: Self-pay | Admitting: Oncology

## 2019-03-25 DIAGNOSIS — C50011 Malignant neoplasm of nipple and areola, right female breast: Secondary | ICD-10-CM

## 2019-03-25 DIAGNOSIS — G629 Polyneuropathy, unspecified: Secondary | ICD-10-CM

## 2019-03-25 DIAGNOSIS — Z170 Estrogen receptor positive status [ER+]: Secondary | ICD-10-CM

## 2019-03-25 DIAGNOSIS — C50412 Malignant neoplasm of upper-outer quadrant of left female breast: Secondary | ICD-10-CM

## 2019-03-25 DIAGNOSIS — G4720 Circadian rhythm sleep disorder, unspecified type: Secondary | ICD-10-CM

## 2019-03-25 MED FILL — PANTOPRAZOLE SOD DR 40 MG T: 40 | 30 days supply | Qty: 30 | Fill #0

## 2019-03-25 MED FILL — traZODone HCL 50 MG TABS: 50 | 30 days supply | Qty: 60 | Fill #0

## 2019-03-25 MED FILL — IBUPROFEN 800 MG TAB: 800 | 30 days supply | Qty: 90 | Fill #3

## 2019-03-25 MED FILL — GABAPENTIN 300 MG CAPSULE: 300 | 30 days supply | Qty: 180 | Fill #0

## 2019-03-27 ENCOUNTER — Other Ambulatory Visit: Payer: Self-pay | Admitting: *Deleted

## 2019-03-27 DIAGNOSIS — G629 Polyneuropathy, unspecified: Secondary | ICD-10-CM

## 2019-03-27 DIAGNOSIS — C50012 Malignant neoplasm of nipple and areola, left female breast: Secondary | ICD-10-CM

## 2019-03-27 DIAGNOSIS — G4720 Circadian rhythm sleep disorder, unspecified type: Secondary | ICD-10-CM

## 2019-03-27 DIAGNOSIS — C50011 Malignant neoplasm of nipple and areola, right female breast: Secondary | ICD-10-CM

## 2019-03-27 MED ORDER — TRAZODONE HCL 50 MG PO TABS: ORAL_TABLET | ORAL | 1 refills | Status: DC

## 2019-03-27 MED ORDER — GABAPENTIN 300 MG PO CAPS: ORAL_CAPSULE | ORAL | 1 refills | Status: DC

## 2019-04-07 MED FILL — metFORMIN HCL 500 MG TABS: 500 | 30 days supply | Qty: 60 | Fill #1

## 2019-04-07 MED FILL — valACYclovir HCL 1 GM TABS: 1 | 45 days supply | Qty: 90 | Fill #1

## 2019-04-10 MED FILL — LOSARTAN-HCTZ 100-25 MG TAB: 100-25 | 30 days supply | Qty: 30 | Fill #1

## 2019-04-14 NOTE — Progress Notes (Signed)
[place]  [phone] [phone]   ID: Margaret [name] DOB: 1961-06-17  MR#: [id number]  STM#:[id number]  Patient Care Team: Hoyt Koch, MD as PCP - General (Internal Medicine) Jovita Kussmaul, MD as Consulting Physician (General Surgery) Krikor Willet, Virgie Dad, MD as Consulting Physician (Oncology) Gery Pray, MD as Consulting Physician (Radiation Oncology) Mauro Kaufmann, RN as Registered Nurse Rockwell Germany, RN as Registered Nurse Benson Norway, RN as Registered Nurse Rosemarie Ax, MD as Consulting Physician (Family Medicine) OTHER MD:   CHIEF COMPLAINT:  estrogen receptor positive breast cancer; bilateral   CURRENT TREATMENT: anastrozole   BREAST CANCER HISTORY: From the original intake note:  [name]'s primary care physician retired sometime ago and her health maintenance has not been up-to-date. She has been receiving medical care through the emergency room and was seen in March 2015 following a head injury, then in August 2015 for lancing of an abscess. In December 2015 she noticed that her left breast looked a little bit smaller than her right. She brought this to the attention of friends and family over the next several months but the general feeling was that most people are not perfectly symmetrical. By the summer of this year she noticed that her nipple on the left was "going in". More recently she started developing pain in the left breast. She was evaluated for this in the emergency room 04/09/2015 at which time a left breast exam showed a deformed left breast with a large hard mass encompassing most of the breast, with nipple retraction. There was no nipple discharge or bleeding and no palpable adenopathy.  The patient was referred to [facility] and on 04/13/2015 she underwent bilateral diagnostic mammography with tomosynthesis as well as bilateral breast ultrasound. The breast density was category B. In the right breast at  the 11:00 position there was a 1.3 cm irregular mass which by ultrasonography measured 1.3 cm.--In the left breast there was a 4 cm irregular mass at the 2:00 position associated with nipple retraction. There was a second, 1.7 cm area of architectural distortion at the 9:00 position. Both were palpable. By ultrasonography, the 4 cm irregular mass was noted, with a second mass measuring 2.5 cm by ultrasonography. Both axillae were benign.  On 04/19/2015 the patient underwent right breast upper outer quadrant biopsy, showing ([id number]) and invasive ductal carcinoma, grade 1, estrogen receptor 90% positive, progesterone receptor negative, with an MIB-1 of 5%, and no HER-2 amplification, the signals ratio being 1.26 and the number per cell 2.20.  On the same day, she underwent biopsy of the 2 left breast masses in question as well as a suspicious left axillary lymph node. All 3 biopsies showed invasive ductal carcinoma, grade 2, estrogen receptor 80-100% positive, progesterone receptor 80-100% positive, with the MIB-1 between 5 and 10%, and no HER-2 amplification, the signals ratio being between 1.05 and 1.13, and the number per cell between 2.10 and 2.25.  The patient's subsequent history is as detailed below   INTERVAL HISTORY: Valine returns today for follow-up and treatment of her estrogen receptor positive breast cancer. She was last seen here on 08/19/2018.   She continues on anastrozole.  She tolerates this generally well although she does have some arthralgias and myalgias which might be due to it or might be aggravated by it.  She also has significant arthritis problems which complicates evaluation.  There is no bone density screening on file.   Since her last visit here, she  has not undergone any additional studies. She is status post bilateral mastectomies, and thus does not require annual mammography.    REVIEW OF SYSTEMS:  Margaret [name] is back at work at [employer] at [employer].   She walks occasionally but otherwise does not exercise regularly.  She feels things are going well right now.  She is taking appropriate pandemic precautions understanding that where she works it can be difficult because some of the students are not cooperative.  Her compression sleeves are no longer working.  Otherwise a detailed review of systems today was noncontributory   PAST MEDICAL HISTORY: Past Medical History:  Diagnosis Date  . Allergy   . Anxiety   . Arthritis   . Bilateral breast cancer ([place])   . Breast cancer ([place])   . Breast cancer of upper-outer quadrant of left female breast ([place]) 04/21/2015  . Depression   . Diabetes mellitus without complication ([place])   . GERD (gastroesophageal reflux disease)   . Hypertension     PAST SURGICAL HISTORY: Past Surgical History:  Procedure Laterality Date  . ABDOMINAL HYSTERECTOMY  1990  . MASTECTOMY MODIFIED RADICAL Left 05/09/2015  . MASTECTOMY MODIFIED RADICAL Left 05/09/2015   Procedure: LEFT MODIFIED RADICAL MASTETCTOMY;  Surgeon: Autumn Messing III, MD;  Location: [facility];  Service: General;  Laterality: Left;  Marland Kitchen MASTECTOMY W/ SENTINEL NODE BIOPSY Right   . MASTECTOMY W/ SENTINEL NODE BIOPSY Right 05/09/2015   Procedure: RIGHT MASTECTOMY WITH RIGHT AXILLARY SENTINEL LYMPH NODE BIOPSY;  Surgeon: Autumn Messing III, MD;  Location: [facility];  Service: General;  Laterality: Right;  . PORTACATH PLACEMENT Right 09/01/2015   Procedure: INSERTION PORT-A-CATH;  Surgeon: Autumn Messing III, MD;  Location: [facility];  Service: General;  Laterality: Right;  . TUBAL LIGATION  1984    FAMILY HISTORY Family History  Problem Relation Age of Onset  . Hypertension Mother   . Diabetes Mother   . Colon cancer Neg Hx   . Esophageal cancer Neg Hx   . Rectal cancer Neg Hx   . Stomach cancer Neg Hx     the patient has little information on her father. Her mother is living, currently age 9. She has one brother, 2 sisters. There is no history of  breast or ovarian cancer in the family to her knowledge.   GYNECOLOGIC HISTORY:  No LMP recorded. Patient has had a hysterectomy.  menarche age 52, first live birth age 47. She is GX P4. She underwent a total abdominal hysterectomy with bilateral salpingo-oophorectomy at age 83. She did not take hormone replacement. She never used oral contraceptives.   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 She previously worked the same position at [employer] for 25+ years. She is single and lives alone  The patient's daughter, [name], works as a Programmer, applications, as does her daughter [name]. Daughter [name] works at [employer], as a call. All 3 live in [place]. The patient's son Marc Morgans Goodlow's lives in [place]. He prepares sets for shows The patient has 4 grandchildren, no great grandchildren. She attends a [employer] in [place].    ADVANCED DIRECTIVES: Not in place. At the initial clinic visit the patient was given the appropriate forms to complete and notarize at her discretion. The patient intends to name her daughter Sampson Si as healthcare power of attorney. She can be reached at [phone].   HEALTH MAINTENANCE: Social History  Tobacco Use  . Smoking status: Former Smoker    Packs/day: 0.10    Years: 28.00    Pack years: 2.80    Quit date: 09/02/2018    Years since quitting: 0.6  . Smokeless tobacco: Never Used  Substance Use Topics  . Alcohol use: Yes    Alcohol/week: 0.0 standard drinks    Comment: Socially  . Drug use: No     Colonoscopy: Never  PAP: Status post hysterectomy  Bone density: Never  Lipid panel:  Allergies  Allergen Reactions  . Banana Hives    Tongue itching  . Latex Itching and Other (See Comments)    burning  . 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 Wheat Bran Hives    Current Outpatient Medications  Medication Sig  Dispense Refill  . amLODipine (NORVASC) 5 MG tablet TAKE 1 TABLET BY MOUTH ONCE DAILY 90 tablet 6  . anastrozole (ARIMIDEX) 1 MG tablet Take 1 tablet (1 mg total) by mouth daily. 90 tablet 4  . busPIRone (BUSPAR) 5 MG tablet TAKE 1 TABLET (5 MG TOTAL) BY MOUTH DAILY AS NEEDED FOR ANXIETY 30 tablet 3  . calcium-vitamin D (OSCAL WITH D) 500-200 MG-UNIT tablet Take 1 tablet by mouth 2 (two) times daily. 60 tablet 0  . diphenhydrAMINE (BENADRYL) 25 mg capsule Take 1 capsule (25 mg total) by mouth every 6 (six) hours as needed. (Patient taking differently: Take 25 mg by mouth every 6 (six) hours as needed for allergies. ) 100 capsule 3  . gabapentin (NEURONTIN) 300 MG capsule TAKE 2 CAPSULES BY MOUTH THREE TIMES DAILY 180 capsule 1  . ibuprofen (ADVIL,MOTRIN) 800 MG tablet TAKE 1 TABLET (800 MG TOTAL) BY MOUTH EVERY 8 HOURS AS NEEDED. 90 tablet 3  . losartan-hydrochlorothiazide (HYZAAR) 100-25 MG tablet TAKE 1 TABLET BY MOUTH DAILY. 90 tablet 3  . metFORMIN (GLUCOPHAGE) 500 MG tablet Take 1 tablet (500 mg total) by mouth 2 (two) times daily. 60 tablet 2  . Multiple Vitamins-Minerals (CENTRUM SILVER 50+WOMEN PO) Take 1 tablet by mouth daily.    . pantoprazole (PROTONIX) 40 MG tablet TAKE 1 TABLET BY MOUTH ONCE A DAY 30 tablet 2  . potassium chloride (MICRO-K) 10 MEQ CR capsule TAKE 2 CAPSULES (20 MEQ TOTAL) BY MOUTH 2 (TWO) TIMES DAILY. 180 capsule 4  . traMADol (ULTRAM) 50 MG tablet TAKE 1 TABLET (50 MG TOTAL) BY MOUTH DAILY AS NEEDED. 30 tablet 2  . traZODone (DESYREL) 50 MG tablet 1 po qhs may increase to 2 tabs if needed. 60 tablet 1  . valACYclovir (VALTREX) 1000 MG tablet Take 1 tablet (1,000 mg total) by mouth 2 (two) times daily. 90 tablet 4  . venlafaxine XR (EFFEXOR-XR) 150 MG 24 hr capsule Take 1 capsule (150 mg total) by mouth daily with breakfast. 90 capsule 3   No current facility-administered medications for this visit.       OBJECTIVE: Morbidly obese African-American woman in no acute  distress  Vitals:   04/15/19 1506  BP: (!) 151/98  Pulse: 87  Resp: 18  Temp: 98 F (36.7 C)  SpO2: 94%   Wt Readings from Last 3 Encounters:  04/15/19 267 lb 11.2 oz (121.4 kg)  09/16/18 274 lb (124.3 kg)  09/02/18 274 lb (124.3 kg)   Body mass index is 39.53 kg/m.    ECOG FS:1 - Symptomatic but completely ambulatory  Ocular: Sclerae unicteric, pupils round and equal Ear-nose-throat: Wearing a mask  Lymphatic: No cervical or supraclavicular adenopathy Lungs no rales or rhonchi Heart regular rate and rhythm Abd soft, nontender, positive bowel sounds MSK no focal spinal tenderness, no joint edema Neuro: non-focal, well-oriented, appropriate affect Breasts: Status post bilateral mastectomies.  No evidence of local recurrence.  Both axillae are benign.  LAB RESULTS:  CMP     Component Value Date/Time   NA 141 08/19/2018 1254   NA 142 07/24/2017 1407   K 3.1 (L) 08/19/2018 1254   K 3.3 (L) 07/24/2017 1407   CL 100 08/19/2018 1254   CO2 30 08/19/2018 1254   CO2 26 07/24/2017 1407   GLUCOSE 157 (H) 08/19/2018 1254   GLUCOSE 110 07/24/2017 1407   BUN 15 08/19/2018 1254   BUN 13.4 07/24/2017 1407   CREATININE 1.00 08/19/2018 1254   CREATININE 1.0 07/24/2017 1407   CALCIUM 10.2 08/19/2018 1254   CALCIUM 9.6 07/24/2017 1407   PROT 8.6 (H) 08/19/2018 1254   PROT 8.5 (H) 07/24/2017 1407   ALBUMIN 3.9 08/19/2018 1254   ALBUMIN 3.9 07/24/2017 1407   AST 20 08/19/2018 1254   AST 23 07/24/2017 1407   ALT 30 08/19/2018 1254   ALT 28 07/24/2017 1407   ALKPHOS 93 08/19/2018 1254   ALKPHOS 84 07/24/2017 1407   BILITOT 0.3 08/19/2018 1254   BILITOT 0.30 07/24/2017 1407   GFRNONAA >60 08/19/2018 1254   GFRAA >60 08/19/2018 1254    INo results found for: SPEP, UPEP  Lab Results  Component Value Date   WBC 11.3 (H) 04/15/2019   NEUTROABS 6.8 04/15/2019   HGB 13.0 04/15/2019   HCT 39.0 04/15/2019   MCV 92.9 04/15/2019   PLT 223 04/15/2019      Chemistry       Component Value Date/Time   NA 141 08/19/2018 1254   NA 142 07/24/2017 1407   K 3.1 (L) 08/19/2018 1254   K 3.3 (L) 07/24/2017 1407   CL 100 08/19/2018 1254   CO2 30 08/19/2018 1254   CO2 26 07/24/2017 1407   BUN 15 08/19/2018 1254   BUN 13.4 07/24/2017 1407   CREATININE 1.00 08/19/2018 1254   CREATININE 1.0 07/24/2017 1407   GLU 157 (H) 08/17/2016 0818      Component Value Date/Time   CALCIUM 10.2 08/19/2018 1254   CALCIUM 9.6 07/24/2017 1407   ALKPHOS 93 08/19/2018 1254   ALKPHOS 84 07/24/2017 1407   AST 20 08/19/2018 1254   AST 23 07/24/2017 1407   ALT 30 08/19/2018 1254   ALT 28 07/24/2017 1407   BILITOT 0.3 08/19/2018 1254   BILITOT 0.30 07/24/2017 1407       No results found for: LABCA2  No components found for: LABCA125  No results for input(s): INR in the last 168 hours.  Urinalysis    Component Value Date/Time   COLORURINE YELLOW 06/29/2015 1030   APPEARANCEUR CLOUDY (A) 06/29/2015 1030   LABSPEC 1.019 06/29/2015 1030   PHURINE 7.0 06/29/2015 1030   GLUCOSEU NEGATIVE 06/29/2015 1030   HGBUR TRACE (A) 06/29/2015 1030   BILIRUBINUR NEGATIVE 06/29/2015 1030   KETONESUR NEGATIVE 06/29/2015 1030   PROTEINUR NEGATIVE 06/29/2015 1030   NITRITE NEGATIVE 06/29/2015 1030   LEUKOCYTESUR MODERATE (A) 06/29/2015 1030    STUDIES: No results found.   ASSESSMENT: 58 y.o. [place] woman with synchronous breast cancers, as follows  (a) status post right breast upper outer quadrant biopsy 04/19/2015 for a clinical pT1c N0, stage IA invasive ductal carcinoma, grade 1, estrogen receptor positive, progesterone receptor  negative, with an MIB-1 of 5%, and no HER-2 amplification  (b) status post left breast upper outer quadrant biopsy 2 and left axillary lymph node biopsy 04/19/2015 for a clinical T3 N1, stage IIIA invasive ductal carcinoma, grade 1 or 2, estrogen receptor and progesterone receptor positive, HER-2 negative, with an MIB-1 between 5 and 10%  (1) status  post bilateral mastectomies 05/09/2015, showing:   (a) on the right side, a pT2 pN0, stage IIA invasive ductal carcinoma, with negative margins and repeat HER-2 again negative    (b) on the left, a pT3 pN2, stage IIIA invasive ductal carcinoma, grade 2, with negative margins, and repeat HER-2 again negative  (2) Mammaprint from the left-sided tumor returned "luminal type, low risk", predicting a small benefit from chemotherapy with the important caveat that N2 disease was not included in the MINDACT study--patient opted for chemotherapy  (3) Oncotype from the right-sided tumor showed a score of 12, predicting an 8% risk of recurrence outside the breast within the next 10 years, if the patient's only systemic therapy was tamoxifen for 5 years. It also predicted no significant benefit from chemotherapy  (4) postmastectomy radiation to the left chest wall completed 08/15/2015  (5) adjuvant chemotherapy consisting of cyclophosphamide and docetaxel x4 completed on 11/21/15.  (5) anastrozole started 02/08/2016  (6) left upper extremity lymphedema  (7) signed for B-WELL study 02/08/2016  (8) participating in [place] trial as of August 2017  (a) randomized to palbociclib, starting October 2017  (b) completed 2 years of palbociclib October 2019, with no dose reductions necessary  PLAN: Cammie is almost 4 years out from definitive surgery for her breast cancer with no evidence of disease recurrence.  This is very favorable.  She is tolerating anastrozole well and the plan is to continue that a minimum of 5 years.  As per the ongoing [place] trial she does not need to be seen except yearly from this point.  She is very excited about this.  I am refilling some of her medications including her blood pressure medications.  She will call for a flu shot next week.  She will contact physical therapy to get set up for new compression sleeves  She knows to call for any other issue that may develop before  the next visit.   Malayja Freund, Virgie Dad, MD  04/15/19 3:09 PM Medical Oncology and Hematology [facility] [address] [place], [place] [address] Tel. [phone]    Fax. [phone]  I, Jacqualyn Posey am acting as a Education administrator for Chauncey Cruel, MD.   I, Lurline Del MD, have reviewed the above documentation for accuracy and completeness, and I agree with the above.

## 2019-04-15 ENCOUNTER — Inpatient Hospital Stay: Payer: Medicare Other | Attending: Oncology

## 2019-04-15 ENCOUNTER — Inpatient Hospital Stay (HOSPITAL_BASED_OUTPATIENT_CLINIC_OR_DEPARTMENT_OTHER): Payer: Medicare Other | Admitting: Oncology

## 2019-04-15 ENCOUNTER — Other Ambulatory Visit: Payer: Self-pay

## 2019-04-15 ENCOUNTER — Encounter: Payer: Self-pay | Admitting: *Deleted

## 2019-04-15 VITALS — BP 151/98 | HR 87 | Temp 98.0°F | Resp 18 | Ht 69.0 in | Wt 267.7 lb

## 2019-04-15 DIAGNOSIS — I10 Essential (primary) hypertension: Secondary | ICD-10-CM

## 2019-04-15 DIAGNOSIS — C50211 Malignant neoplasm of upper-inner quadrant of right female breast: Secondary | ICD-10-CM

## 2019-04-15 DIAGNOSIS — Z79811 Long term (current) use of aromatase inhibitors: Secondary | ICD-10-CM

## 2019-04-15 DIAGNOSIS — C50411 Malignant neoplasm of upper-outer quadrant of right female breast: Secondary | ICD-10-CM

## 2019-04-15 DIAGNOSIS — Z9013 Acquired absence of bilateral breasts and nipples: Secondary | ICD-10-CM

## 2019-04-15 DIAGNOSIS — Z006 Encounter for examination for normal comparison and control in clinical research program: Secondary | ICD-10-CM

## 2019-04-15 DIAGNOSIS — C50412 Malignant neoplasm of upper-outer quadrant of left female breast: Secondary | ICD-10-CM | POA: Diagnosis not present

## 2019-04-15 DIAGNOSIS — Z170 Estrogen receptor positive status [ER+]: Secondary | ICD-10-CM | POA: Insufficient documentation

## 2019-04-15 DIAGNOSIS — Z9221 Personal history of antineoplastic chemotherapy: Secondary | ICD-10-CM

## 2019-04-15 DIAGNOSIS — C773 Secondary and unspecified malignant neoplasm of axilla and upper limb lymph nodes: Secondary | ICD-10-CM

## 2019-04-15 DIAGNOSIS — Z923 Personal history of irradiation: Secondary | ICD-10-CM

## 2019-04-15 DIAGNOSIS — E1169 Type 2 diabetes mellitus with other specified complication: Secondary | ICD-10-CM

## 2019-04-15 LAB — CBC WITH DIFFERENTIAL (CANCER CENTER ONLY)
Abs Immature Granulocytes: 0.05 10*3/uL (ref 0.00–0.07)
Basophils Absolute: 0.1 10*3/uL (ref 0.0–0.1)
Basophils Relative: 1 %
Eosinophils Absolute: 0.3 10*3/uL (ref 0.0–0.5)
Eosinophils Relative: 2 %
HCT: 39 % (ref 36.0–46.0)
Hemoglobin: 13 g/dL (ref 12.0–15.0)
Immature Granulocytes: 0 %
Lymphocytes Relative: 27 %
Lymphs Abs: 3 10*3/uL (ref 0.7–4.0)
MCH: 31 pg (ref 26.0–34.0)
MCHC: 33.3 g/dL (ref 30.0–36.0)
MCV: 92.9 fL (ref 80.0–100.0)
Monocytes Absolute: 1 10*3/uL (ref 0.1–1.0)
Monocytes Relative: 9 %
Neutro Abs: 6.8 10*3/uL (ref 1.7–7.7)
Neutrophils Relative %: 61 %
Platelet Count: 223 10*3/uL (ref 150–400)
RBC: 4.2 MIL/uL (ref 3.87–5.11)
RDW: 13.2 % (ref 11.5–15.5)
WBC Count: 11.3 10*3/uL — ABNORMAL HIGH (ref 4.0–10.5)
nRBC: 0 % (ref 0.0–0.2)

## 2019-04-15 LAB — CMP (CANCER CENTER ONLY)
ALT: 35 U/L (ref 0–44)
AST: 25 U/L (ref 15–41)
Albumin: 4 g/dL (ref 3.5–5.0)
Alkaline Phosphatase: 97 U/L (ref 38–126)
Anion gap: 10 (ref 5–15)
BUN: 17 mg/dL (ref 6–20)
CO2: 26 mmol/L (ref 22–32)
Calcium: 9.8 mg/dL (ref 8.9–10.3)
Chloride: 106 mmol/L (ref 98–111)
Creatinine: 1.06 mg/dL — ABNORMAL HIGH (ref 0.44–1.00)
GFR, Est AFR Am: >60 mL/min (ref >60)
GFR, Estimated: 58 mL/min — ABNORMAL LOW (ref >60)
Glucose, Bld: 143 mg/dL — ABNORMAL HIGH (ref 70–99)
Potassium: 3.6 mmol/L (ref 3.5–5.1)
Sodium: 142 mmol/L (ref 135–145)
Total Bilirubin: <0.2 mg/dL — ABNORMAL LOW (ref 0.3–1.2)
Total Protein: 7.9 g/dL (ref 6.5–8.1)

## 2019-04-15 MED ORDER — ANASTROZOLE 1 MG PO TABS: 1.0000 mg | ORAL_TABLET | Freq: Every day | ORAL | 4 refills | Status: DC

## 2019-04-15 MED ORDER — LOSARTAN POTASSIUM-HCTZ 100-25 MG PO TABS: 1.0000 | ORAL_TABLET | Freq: Every day | ORAL | 3 refills | Status: DC

## 2019-04-15 MED ORDER — VENLAFAXINE HCL ER 150 MG PO CP24: 150.0000 mg | ORAL_CAPSULE | Freq: Every day | ORAL | 3 refills | Status: DC

## 2019-04-15 MED FILL — traMADol HCL 50 MG TABS: 50 | 30 days supply | Qty: 30 | Fill #1

## 2019-04-15 NOTE — Research (Signed)
04/15/2019   AFT-05 PALLAS - Follow-Up Phase I Month 30  Patient into clinic today unaccompanied for routine follow-up visit.  PRO & Adherence Questionnaires: Completed by patient upon arrival to clinic.  Anti-cancer medications - Patient states she is continuing to take anastrozole without interruption; she is on no new anti-cancer medications.   Serious adverse events - Patient denies any new, serious health problems since her last visit. She reports a number of planned healthcare encounters to see her dentist, primary care physician and eye doctor for vision follow-up.   Disease monitoring - See MD note.  COVID-19 - Patient continues to follow precautions for COVID-19 infection and is aware that her medical history puts her at risk. She denies any COVID-19 infection and has not had any testing for COVID-19 since the pandemic began.  Patient is aware that research nurse will contact her by phone for the Month 42 follow-up visit. Otherwise, she will return to the clinic in August 2021 for annual BWEL and [place] f/u visits. Patient agrees with this plan. Thanked patient for her participation in these research studies.  [facility] [id number] BWEL -Month 36 visit  Patient into clinic today unaccompanied for routine follow-up visit. Due to COVID-19 restrictions in place at the time of the planned Month 36 follow-up visit, previously scheduled for 01/19/2019, visit was rescheduled to occur with the AFT-05 PALLAS study visit. A 20-month window for in-person visits was allowed per the [facility] [id number] study memo dated 10/27/2018.  Follow-Up Questionnaire: Completed independently by patient upon arrival to clinic.  Disease status/outcome assessment - See MD note.  Weight and waist and hip circumference measurements - obtained per protocol and following the instructions in the BWEL Weight and Height Protocol document.  Solicited AEs - Patient denies the occurrence of any fractures, sprains,  tendon/ligament injuries or orthopedic surgeries since the study Month 30 visit in January.   COVID-19 - Patient denies any COVID-19 infection and has not had any testing for COVID-19 since the pandemic began.  Patient understands that she will continue in the follow-up phase including ongoing measurements to occur annually in conjunction with her PALLAS study visits. Thanked patient for her study participation.  Cindy S. Brigitte Pulse BSN, RN, [place] 04/15/2019 4:28 PM

## 2019-04-16 ENCOUNTER — Telehealth: Payer: Self-pay | Admitting: Oncology

## 2019-04-16 LAB — HEMOGLOBIN A1C
Hgb A1c MFr Bld: 8.2 % — ABNORMAL HIGH (ref 4.8–5.6)
Mean Plasma Glucose: 189 mg/dL

## 2019-04-16 NOTE — Telephone Encounter (Signed)
I left a message regarding schedule  

## 2019-04-16 NOTE — Research (Signed)
04/16/2019 AFT-05 PALLAS - Follow-Up Phase I Month 36  Adverse Event Log - from review with patient on 04/15/2019 Event Grade Onset Date Resolved Date Comments  Dry skin 1 04/04/2016 ongoing   Itching 1 04/04/2016 ongoing   Hypertension, intermittent 3 05/30/2016 07/15/2018 Improved to Grade 1 (grade 2 @ baseline)  Depression 2 07/17/2016 ongoing   Decreased concentration 1 07/17/2016 ongoing   Peripheral neuropathy, int 2 11/19/2016 ongoing   Insomnia 2 12/20/2016 04/15/2019 Improved to baseline (grade 1) w/Trazodone  Allergic rhinitis 2 6/UN/2018 ongoing   Type 2 DM 2 03/28/2017 ongoing   Hypokalemia 1 03/28/2017 04/15/2019   Weight loss 1 07/24/2017 ongoing   Clammy skin 1 09/23/2017 ongoing   Lightheadedness 1 09/23/2017 ongoing   Skin burning sensation 1 09/23/2017 ongoing   Lateral leg pain 2 10/21/2017 ongoing   Tearing, increased 2 05/08/2018 ongoing   Left hip pain 2 01/18/2018 ongoing    Cindy S. Brigitte Pulse BSN, RN, CCRP 04/16/2019 10:44 AM

## 2019-04-17 ENCOUNTER — Other Ambulatory Visit: Payer: Self-pay | Admitting: *Deleted

## 2019-04-17 MED FILL — PANTOPRAZOLE SOD DR 40 MG T: 40 | 30 days supply | Qty: 30 | Fill #1

## 2019-04-21 ENCOUNTER — Telehealth: Payer: Self-pay

## 2019-04-21 NOTE — Telephone Encounter (Signed)
Spoke with patient about elevated A1c.  Labs were faxed to Pricilla Holm, MD.  Patient voiced understanding and thanks for call.

## 2019-04-22 ENCOUNTER — Other Ambulatory Visit: Payer: Self-pay | Admitting: Oncology

## 2019-04-22 MED FILL — POTASSIUM CL ER 10 MEQ CAP: 10 | 45 days supply | Qty: 180 | Fill #1

## 2019-04-22 MED FILL — IBUPROFEN 800 MG TAB: 800 | 30 days supply | Qty: 90 | Fill #0

## 2019-04-22 MED FILL — AMLODIPINE BESYLATE 5 MG TA: 5 | 90 days supply | Qty: 90 | Fill #2

## 2019-04-28 ENCOUNTER — Encounter: Payer: Self-pay | Admitting: Internal Medicine

## 2019-04-28 NOTE — Progress Notes (Signed)
error 

## 2019-04-29 MED FILL — traZODone HCL 50 MG TABS: 50 | 30 days supply | Qty: 60 | Fill #1

## 2019-04-29 MED FILL — VENLAFAXINE HCL ER 150 MG C: 150 | 90 days supply | Qty: 90 | Fill #0

## 2019-04-29 MED FILL — GABAPENTIN 300 MG CAPSULE: 300 | 30 days supply | Qty: 180 | Fill #0

## 2019-05-01 ENCOUNTER — Other Ambulatory Visit: Payer: Self-pay | Admitting: Internal Medicine

## 2019-05-01 DIAGNOSIS — Z170 Estrogen receptor positive status [ER+]: Secondary | ICD-10-CM

## 2019-05-01 DIAGNOSIS — C50412 Malignant neoplasm of upper-outer quadrant of left female breast: Secondary | ICD-10-CM

## 2019-05-01 DIAGNOSIS — C50211 Malignant neoplasm of upper-inner quadrant of right female breast: Secondary | ICD-10-CM

## 2019-05-04 NOTE — Telephone Encounter (Signed)
[place] Controlled Database Checked Last filled: 04/15/19 # 30 LOV w/PCP: 07/15/18 Next appt: None

## 2019-05-06 NOTE — Telephone Encounter (Signed)
This would appear to be an early refill, is patient out of medication?

## 2019-05-06 NOTE — Telephone Encounter (Signed)
Left message for patient to call back  

## 2019-05-08 MED FILL — LOSARTAN-HCTZ 100-25 MG TAB: 100-25 | 30 days supply | Qty: 30 | Fill #2

## 2019-05-08 MED FILL — metFORMIN HCL 500 MG TABS: 500 | 30 days supply | Qty: 60 | Fill #2

## 2019-05-08 NOTE — Telephone Encounter (Signed)
I called pt again. She states she has started back working and had to take it more often recently. She is completely out of Tramadol now.

## 2019-05-10 MED FILL — PANTOPRAZOLE SOD DR 40 MG T: 40 | 30 days supply | Qty: 30 | Fill #2

## 2019-05-14 MED FILL — traMADol HCL 50 MG TABS: 50 | 30 days supply | Qty: 30 | Fill #0

## 2019-05-18 MED FILL — IBUPROFEN 800 MG TAB: 800 | 30 days supply | Qty: 90 | Fill #1

## 2019-05-18 MED FILL — ANASTROZOLE 1 MG TABLET: 1 | 90 days supply | Qty: 90 | Fill #1

## 2019-05-19 ENCOUNTER — Encounter: Payer: Self-pay | Admitting: Internal Medicine

## 2019-05-19 ENCOUNTER — Ambulatory Visit (INDEPENDENT_AMBULATORY_CARE_PROVIDER_SITE_OTHER): Payer: Medicare Other | Admitting: Internal Medicine

## 2019-05-19 DIAGNOSIS — R0789 Other chest pain: Secondary | ICD-10-CM

## 2019-05-19 DIAGNOSIS — E1169 Type 2 diabetes mellitus with other specified complication: Secondary | ICD-10-CM | POA: Diagnosis not present

## 2019-05-19 DIAGNOSIS — Z13220 Encounter for screening for lipoid disorders: Secondary | ICD-10-CM | POA: Diagnosis not present

## 2019-05-19 MED ORDER — CYCLOBENZAPRINE HCL 10 MG PO TABS: 10.0000 mg | ORAL_TABLET | Freq: Two times a day (BID) | ORAL | 2 refills | Status: DC | PRN

## 2019-05-19 MED FILL — CYCLOBENZAPRINE HCL 10 MG T: 10 | 30 days supply | Qty: 60 | Fill #0

## 2019-05-19 NOTE — Assessment & Plan Note (Signed)
Needs lipid panel and adjust as needed. She is reminded that we have ordered this. Also explained to her that her oncologist is not monitoring all aspects of her health just because they are ordering blood work for her and that we are monitoring things that the oncologist is not. Given diabetes LDL goal is <100 (<70 ideal) and will likely need statin.

## 2019-05-19 NOTE — Progress Notes (Signed)
Virtual Visit via Video Note  I connected with Margaret [name] on 05/19/19 at 10:40 AM EDT by a video enabled telemedicine application and verified that I am speaking with the correct person using two identifiers.  The patient and the provider were at separate locations throughout the entire encounter.   I discussed the limitations of evaluation and management by telemedicine and the availability of in person appointments. The patient expressed understanding and agreed to proceed. The patient and the provider were the only parties present for the visit unless noted in HPI below.  History of Present Illness: The patient is a 58 y.o. female with visit for several concerns including more pain (she states her oncologist has told her she would have pain chronically, she is getting pain with working, started back working in August, was taking her tramadol BID instead of daily as prescribed and was denied early refill, is wanting to try a muscle relaxer to see if this helps and keep tramadol daily only), diabetes (taking metformin which was started and seems to be managed by oncologist, she does not have regular follow up with [place], taking metformin BID and on ARB but not on statin, she states her insurance company called her and told her she should be on one, we had ordered cholesterol tests at last visit Dec 2019 but she did not get this done, she thought her oncologist was checking everything since they do blood work for her) and cholesterol (unknown levels, she did not do labs ordered for her Dec 2019, sees oncology and was not aware that they do not manage her health). She is under the impression that her oncologist is managing her diabetes as he is prescribing her medications and has checked labs on her she thinks he is watching everything. Appears he did check HgA1c back in September which was not at goal but no action was taken at that time by his office.   Observations/Objective: Appearance: normal,  breathing appears normal, casual grooming, abdomen does not appear distended, throat normal, memory normal, mental status is A and O times 3  Assessment and Plan: See problem oriented charting  Follow Up Instructions: labs need to be done, rx flexeril, explained a lot about diabetes and how management of her diabetes should be through [place] as her oncologist is not really treating her diabetes and should likely stop prescribing medications for her and checking HgA1c if they do not intend to manage.   I discussed the assessment and treatment plan with the patient. The patient was provided an opportunity to ask questions and all were answered. The patient agreed with the plan and demonstrated an understanding of the instructions.   The patient was advised to call back or seek an in-person evaluation if the symptoms worsen or if the condition fails to improve as anticipated.  Hoyt Koch, MD

## 2019-05-19 NOTE — Assessment & Plan Note (Signed)
At this time not adequately managed, she is asked to come to our lab and do cholesterol ordered last year. She is on metformin 500 mg BID and she admits to change in diet and exercise with covid-19. She wants to make dietary changes and advised that she needs to see [place] in December for follow up and treatment. Depending on lipid panel she may need statin. Discussed with her extensively that her oncologist is not managing her diabetes and that we do this and she needs to follow up with [place] about this.

## 2019-05-19 NOTE — Assessment & Plan Note (Signed)
Rx for flexeril today and will continue with tramadol daily prn only.

## 2019-06-08 ENCOUNTER — Other Ambulatory Visit: Payer: Self-pay | Admitting: Oncology

## 2019-06-08 DIAGNOSIS — Z170 Estrogen receptor positive status [ER+]: Secondary | ICD-10-CM

## 2019-06-08 DIAGNOSIS — C50412 Malignant neoplasm of upper-outer quadrant of left female breast: Secondary | ICD-10-CM

## 2019-06-08 MED FILL — valACYclovir HCL 1 GM TABS: 1 | 30 days supply | Qty: 60 | Fill #2

## 2019-06-08 MED FILL — PANTOPRAZOLE SOD DR 40 MG T: 40 | 30 days supply | Qty: 30 | Fill #0

## 2019-06-08 MED FILL — POTASSIUM CL ER 10 MEQ CAP: 10 | 45 days supply | Qty: 180 | Fill #2

## 2019-06-12 ENCOUNTER — Other Ambulatory Visit: Payer: Self-pay | Admitting: Oncology

## 2019-06-12 ENCOUNTER — Other Ambulatory Visit: Payer: Self-pay

## 2019-06-12 DIAGNOSIS — E119 Type 2 diabetes mellitus without complications: Secondary | ICD-10-CM

## 2019-06-12 MED FILL — traMADol HCL 50 MG TABS: 50 | 30 days supply | Qty: 30 | Fill #1

## 2019-06-12 MED FILL — LOSARTAN-HCTZ 100-25 MG TAB: 100-25 | 30 days supply | Qty: 30 | Fill #3

## 2019-06-12 MED FILL — metFORMIN HCL 500 MG TABS: 500 | 30 days supply | Qty: 60 | Fill #0

## 2019-06-12 MED FILL — traZODone HCL 50 MG TABS: 50 | 30 days supply | Qty: 60 | Fill #0

## 2019-06-12 NOTE — Patient Outreach (Signed)
[facility]) Care Management  06/12/2019  Margaret [name] 10-31-1960 [id number]   Medication Adherence call to Margaret [name] Hippa Identifiers Verify spoke with patient,she is past due on Metformin 500 mg,patient explain she takes 1 tablet 2 times a day and has already order and is expecting a deliver from the pharmacy,patient did ask to call her doctor to received a 90 days supply for next time,left a message for doctor to call it in to the pharmacy. Margaret [name] is showing past due under [place].   [place] Management Direct Dial [phone]  Fax [phone] [email or web address]

## 2019-06-15 ENCOUNTER — Other Ambulatory Visit: Payer: Self-pay | Admitting: *Deleted

## 2019-06-17 ENCOUNTER — Other Ambulatory Visit: Payer: Self-pay | Admitting: *Deleted

## 2019-06-17 DIAGNOSIS — E119 Type 2 diabetes mellitus without complications: Secondary | ICD-10-CM

## 2019-06-17 MED ORDER — METFORMIN HCL 500 MG PO TABS: 500.0000 mg | ORAL_TABLET | Freq: Two times a day (BID) | ORAL | 2 refills | Status: DC

## 2019-06-17 MED ORDER — AMLODIPINE BESYLATE 5 MG PO TABS: 5.0000 mg | ORAL_TABLET | Freq: Every day | ORAL | 6 refills | Status: DC

## 2019-06-17 MED FILL — IBUPROFEN 800 MG TAB: 800 | 30 days supply | Qty: 90 | Fill #2

## 2019-06-17 MED FILL — CYCLOBENZAPRINE HCL 10 MG T: 10 | 30 days supply | Qty: 60 | Fill #1

## 2019-07-06 MED FILL — traZODone HCL 50 MG TABS: 50 | 30 days supply | Qty: 60 | Fill #0

## 2019-07-10 MED FILL — PANTOPRAZOLE SOD DR 40 MG T: 40 | 30 days supply | Qty: 30 | Fill #1

## 2019-07-10 MED FILL — GABAPENTIN 300 MG CAPSULE: 300 | 30 days supply | Qty: 180 | Fill #1

## 2019-07-10 MED FILL — traMADol HCL 50 MG TABS: 50 | 30 days supply | Qty: 30 | Fill #2

## 2019-07-10 MED FILL — busPIRone HCL 5 MG TABS: 5 | 30 days supply | Qty: 30 | Fill #3

## 2019-07-17 MED FILL — LOSARTAN-HCTZ 100-25 MG TAB: 100-25 | 30 days supply | Qty: 30 | Fill #4

## 2019-07-17 MED FILL — metFORMIN HCL 500 MG TABS: 500 | 30 days supply | Qty: 60 | Fill #1

## 2019-07-17 MED FILL — IBUPROFEN 800 MG TAB: 800 | 30 days supply | Qty: 90 | Fill #3

## 2019-07-17 MED FILL — valACYclovir HCL 1 GM TABS: 1 | 30 days supply | Qty: 60 | Fill #3

## 2019-07-17 MED FILL — CYCLOBENZAPRINE HCL 10 MG T: 10 | 30 days supply | Qty: 60 | Fill #2

## 2019-07-31 IMAGING — CT CT HEAD W/O CM
3 series · 16 of 47 positions shown, 19 images · non-contrast
Comparison: 10/23/2013

CLINICAL DATA: Recent syncopal episode following coughing

EXAM:
CT HEAD WITHOUT CONTRAST
TECHNIQUE: Contiguous axial images were obtained from the base of the skull
through the vertex without intravenous contrast.

[Series 2: head wo · axial · 0.40mm/px · z∈[+521,+646]mm · 10 of 30 slices shown, 13 images]
[im 3/30  brain]
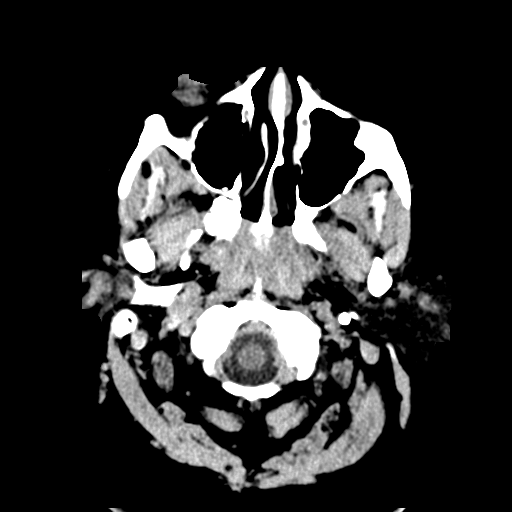
[im 3/30  bone]
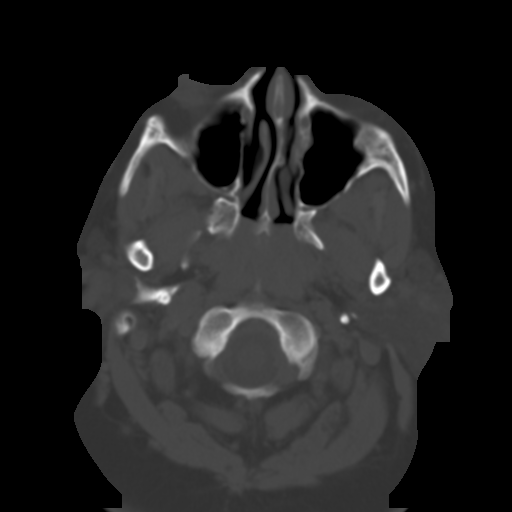
[im 6/30  brain]
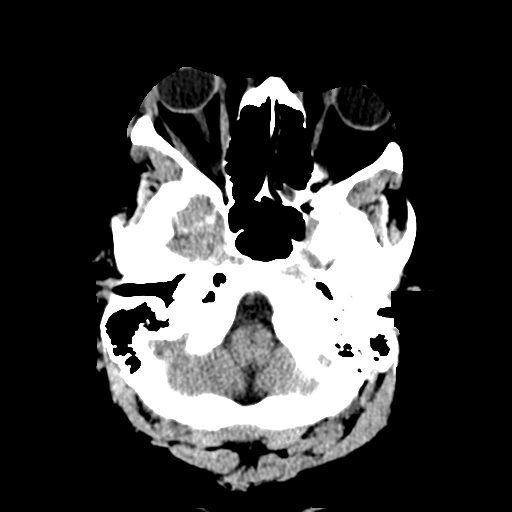
[im 9/30  brain]
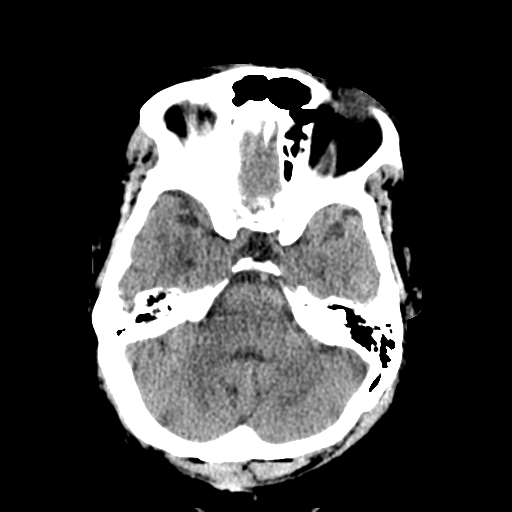
[im 11/30  brain]
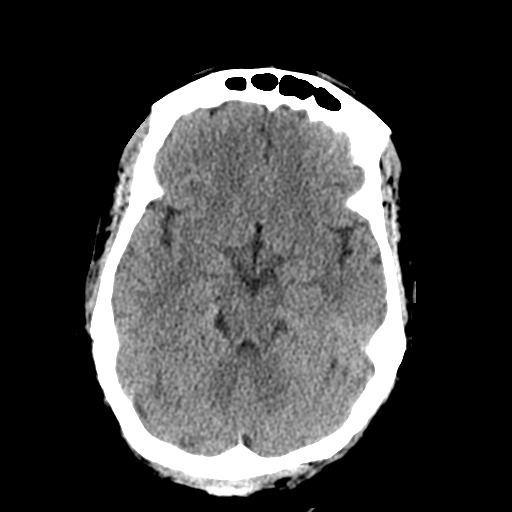
[im 14/30  brain]
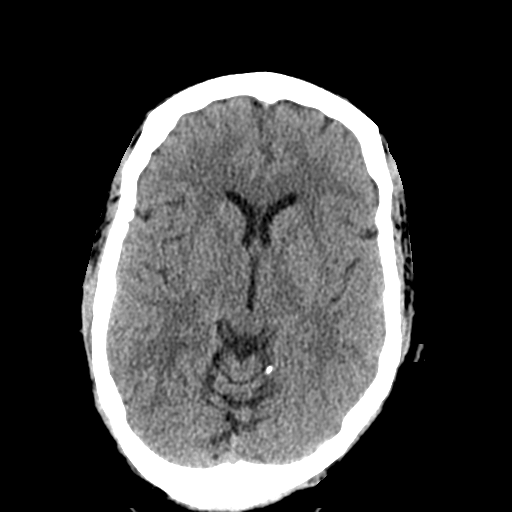
[im 14/30  bone]
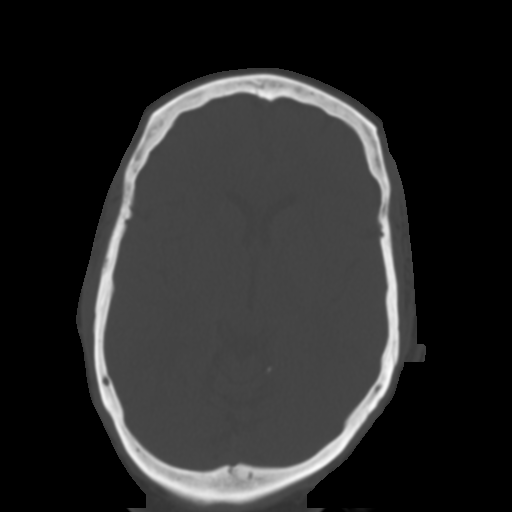
[im 17/30  brain]
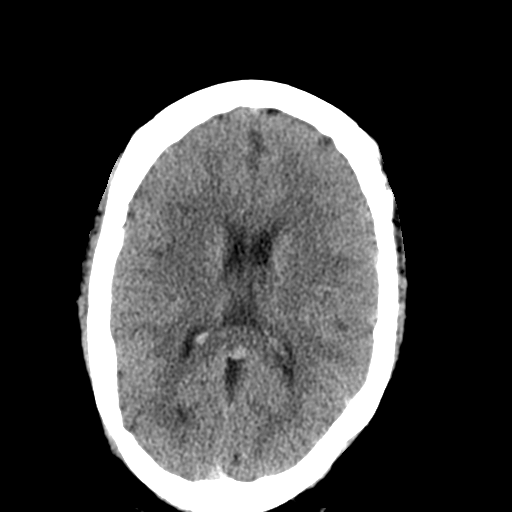
[im 20/30  brain]
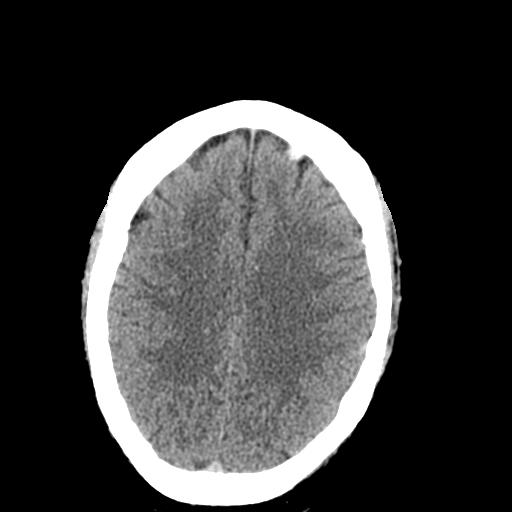
[im 23/30  brain]
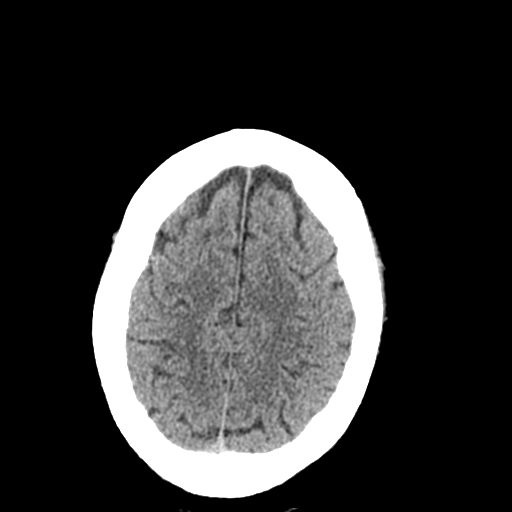
[im 25/30  brain]
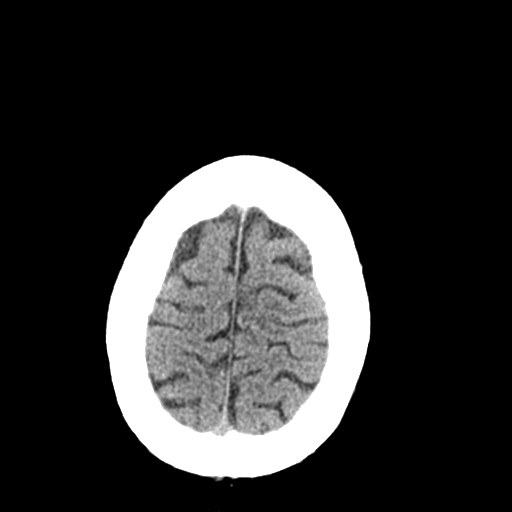
[im 25/30  bone]
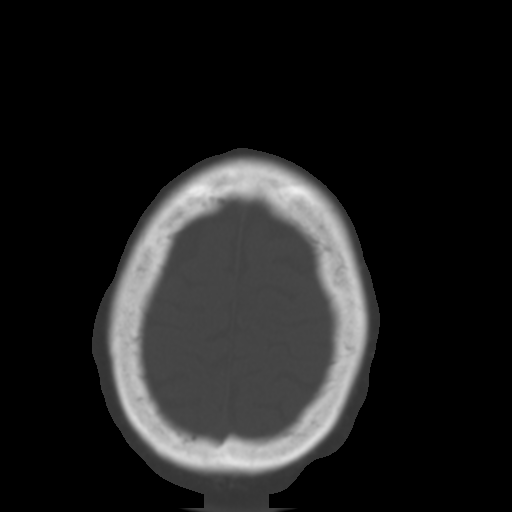
[im 28/30  brain]
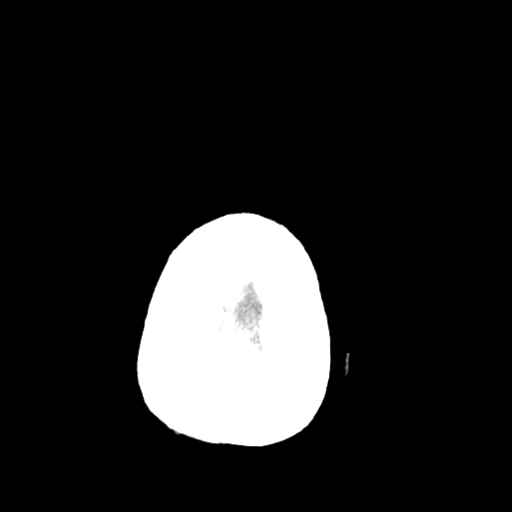

[Series 5: coronal soft tissue · coronal · 0.30mm/px · 3 of 66 slices shown]
[im 22/66  brain]
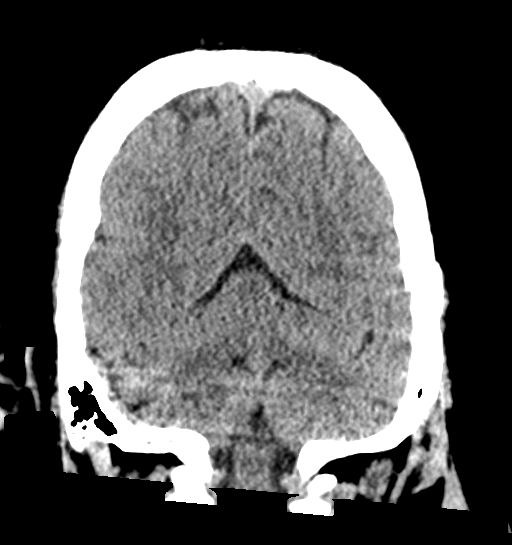
[im 29/66  brain]
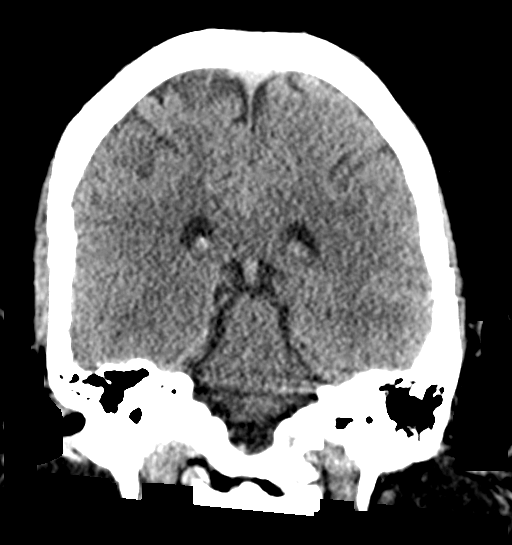
[im 37/66  brain]
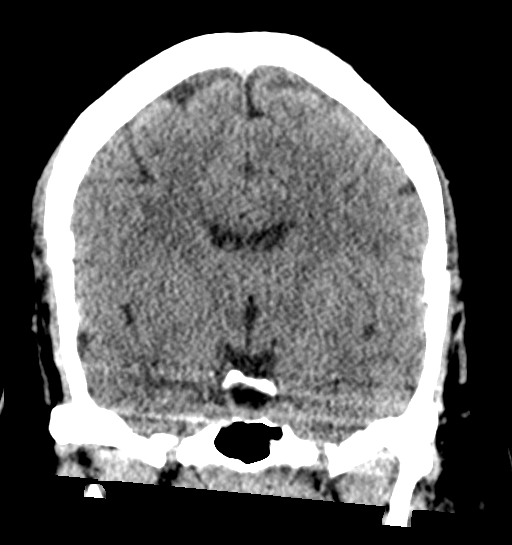

[Series 6: sagittal soft tissue · sagittal · 0.32mm/px · 3 of 52 slices shown]
[im 18/52  brain]
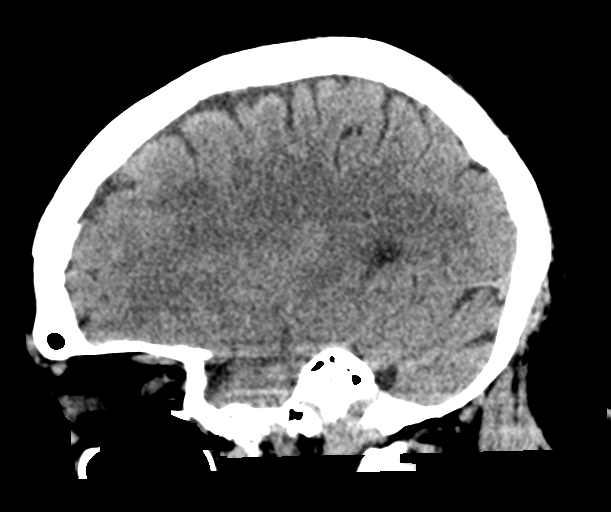
[im 26/52  brain]
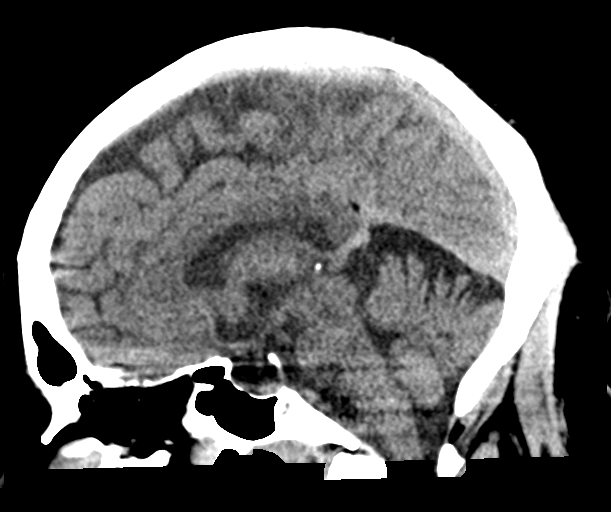
[im 35/52  brain]
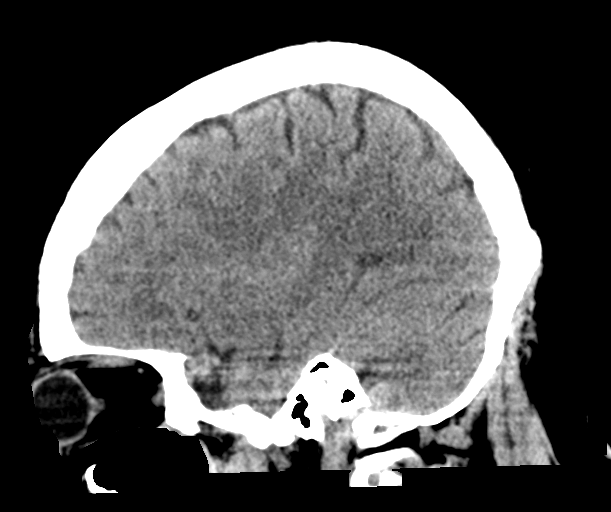

[16 of 47 positions shown; findings below may reference images not displayed]

FINDINGS: Brain: No evidence of acute infarction, hemorrhage, hydrocephalus,
extra-axial collection or mass lesion/mass effect.

Vascular: No hyperdense vessel or unexpected calcification.

Skull: Normal. Negative for fracture or focal lesion.

Sinuses/Orbits: No acute finding.

Other: None.
IMPRESSION: Normal head CT for age

## 2019-08-05 MED FILL — VENLAFAXINE HCL ER 150 MG C: 150 | 90 days supply | Qty: 90 | Fill #1

## 2019-08-05 MED FILL — PANTOPRAZOLE SOD DR 40 MG T: 40 | 30 days supply | Qty: 30 | Fill #2

## 2019-08-14 ENCOUNTER — Other Ambulatory Visit: Payer: Self-pay | Admitting: Internal Medicine

## 2019-08-14 DIAGNOSIS — Z170 Estrogen receptor positive status [ER+]: Secondary | ICD-10-CM

## 2019-08-14 DIAGNOSIS — C50412 Malignant neoplasm of upper-outer quadrant of left female breast: Secondary | ICD-10-CM

## 2019-08-14 DIAGNOSIS — C50211 Malignant neoplasm of upper-inner quadrant of right female breast: Secondary | ICD-10-CM

## 2019-08-14 MED FILL — LOSARTAN-HCTZ 100-25 MG TAB: 100-25 | 30 days supply | Qty: 30 | Fill #5

## 2019-08-14 MED FILL — POTASSIUM CL ER 10 MEQ CAP: 10 | 45 days supply | Qty: 180 | Fill #3

## 2019-08-14 MED FILL — traMADol HCL 50 MG TABS: 50 | 30 days supply | Qty: 30 | Fill #0

## 2019-08-14 MED FILL — AMLODIPINE BESYLATE 5 MG TA: 5 | 90 days supply | Qty: 90 | Fill #0

## 2019-08-14 NOTE — Telephone Encounter (Signed)
 Controlled Database Checked Last filled: 07/10/19 # 30 LOV w/you: 05/19/19 Next appt w/you: None

## 2019-08-28 ENCOUNTER — Other Ambulatory Visit: Payer: Self-pay | Admitting: Internal Medicine

## 2019-08-28 ENCOUNTER — Other Ambulatory Visit: Payer: Self-pay | Admitting: Oncology

## 2019-08-28 DIAGNOSIS — C50412 Malignant neoplasm of upper-outer quadrant of left female breast: Secondary | ICD-10-CM

## 2019-08-28 DIAGNOSIS — Z170 Estrogen receptor positive status [ER+]: Secondary | ICD-10-CM

## 2019-08-28 MED FILL — metFORMIN HCL 500 MG TABS: 500 | 30 days supply | Qty: 60 | Fill #2

## 2019-08-28 MED FILL — PANTOPRAZOLE SOD DR 40 MG T: 40 | 30 days supply | Qty: 30 | Fill #0

## 2019-08-28 MED FILL — IBUPROFEN 800 MG TAB: 800 | 30 days supply | Qty: 90 | Fill #0

## 2019-08-28 MED FILL — traZODone HCL 50 MG TABS: 50 | 30 days supply | Qty: 60 | Fill #1

## 2019-08-28 MED FILL — ANASTROZOLE 1 MG TABLET: 1 | 90 days supply | Qty: 90 | Fill #2

## 2019-08-28 MED FILL — GABAPENTIN 300 MG CAPSULE: 300 | 30 days supply | Qty: 180 | Fill #1

## 2019-08-28 MED FILL — CYCLOBENZAPRINE HCL 10 MG T: 10 | 30 days supply | Qty: 60 | Fill #0

## 2019-09-17 MED FILL — LOSARTAN-HCTZ 100-25 MG TAB: 100-25 | 30 days supply | Qty: 30 | Fill #6

## 2019-09-23 ENCOUNTER — Encounter: Payer: Self-pay | Admitting: *Deleted

## 2019-09-23 DIAGNOSIS — C50412 Malignant neoplasm of upper-outer quadrant of left female breast: Secondary | ICD-10-CM

## 2019-09-23 DIAGNOSIS — Z170 Estrogen receptor positive status [ER+]: Secondary | ICD-10-CM

## 2019-09-23 DIAGNOSIS — C50211 Malignant neoplasm of upper-inner quadrant of right female breast: Secondary | ICD-10-CM

## 2019-09-23 NOTE — Research (Signed)
09/23/2019 @ 1:20pm- [facility] AFT-05 PALLAS - Follow Up Phase I Month 42  Research nurse spoke with the patient by phone for her [place] month 42 telephone call.  Patient continues anti-hormone therapy with anastrozole, and she denies any new anti-cancer medications.  She also denies any serious adverse events since her last study visit.  At present, there is no known evidence of breast cancer recurrence.  The pt reports that she took a COVID-19 test at [employer] on 08/25/19.  The pt reports she was notified that her results were negative.  The pt was thanked for her continued support on the study.  The pt is scheduled to return to the clinic on 03/10/20 for the [facility] [id number] BWEL study.   Brion Aliment RN, BSN, CCRP  Clinical Research Nurse 09/23/2019 1:32 PM

## 2019-09-23 NOTE — Research (Signed)
09/23/19 at 2:36pm - [facility] [id number]/ BWEL study notes The research nurse spoke to the pt today by phone about the new Substudy [id number].  The pt was informed that the study is interesting in learning more about how body composition could influence the risk of developing breast cancer or the risk of breast cancer recurring once it has been treated.  The pt was told that all participants must be re-consented.  The pt was told that this new substudy will not require any additional imaging or extra visits.  The nurse offered to mail the pt a copy of the new consent form and hipaa form to read prior to her next BWEL visit in August 2021.  The pt verbalized understanding regarding the new consent form and the new optional imaging study.  The research nurse verified the pt's current address. The pt was encouraged to read the new consent form and to call her research nurse, Tyrell Antonio, RN if she has any questions/concerns about the study.  The pt was asked about any COVID-19 testing.  The pt stated that she was tested at [employer] on 08/25/19 before she could return to work.  The pt said that she was informed that her results were negative.  The pt was thanked for her continued support on the BWEL study. Brion Aliment RN, BSN, CCRP Clinical Research Nurse 09/23/2019 2:51 PM

## 2019-09-28 ENCOUNTER — Other Ambulatory Visit: Payer: Self-pay | Admitting: Internal Medicine

## 2019-09-28 MED FILL — PANTOPRAZOLE SOD DR 40 MG T: 40 | 30 days supply | Qty: 30 | Fill #1

## 2019-09-28 MED FILL — POTASSIUM CL ER 10 MEQ CAP: 10 | 45 days supply | Qty: 180 | Fill #4

## 2019-09-28 MED FILL — IBUPROFEN 800 MG TAB: 800 | 30 days supply | Qty: 90 | Fill #1

## 2019-09-28 MED FILL — busPIRone HCL 5 MG TABS: 5 | 30 days supply | Qty: 30 | Fill #0

## 2019-09-28 MED FILL — CYCLOBENZAPRINE HCL 10 MG T: 10 | 30 days supply | Qty: 60 | Fill #1

## 2019-09-28 MED FILL — traMADol HCL 50 MG TABS: 50 | 30 days supply | Qty: 30 | Fill #1

## 2019-09-28 MED FILL — valACYclovir HCL 1 GM TABS: 1 | 30 days supply | Qty: 60 | Fill #5

## 2019-09-30 ENCOUNTER — Other Ambulatory Visit: Payer: Self-pay

## 2019-09-30 MED FILL — metFORMIN HCL 500 MG TABS: 500 | 30 days supply | Qty: 60 | Fill #0

## 2019-09-30 NOTE — Patient Outreach (Signed)
Margaret [name]) Care Management  09/30/2019  Margaret [name] 06/05/1961 [id number]   Medication Adherence call to Mrs. Margaret [name] Hippa Identifiers Verify spoke with patient she is past due on Metformin 500 mg patient explain she takes 1 tablet 2 times daily,patient ask to call [place] [facility] to place an order and have them mail out to her,Pharmacy will fill and mail out for patient. [name] is showing past due under [place].  [place] Management Direct Dial [phone]  Fax [phone] [email or web address]

## 2019-10-14 ENCOUNTER — Other Ambulatory Visit: Payer: Self-pay | Admitting: Oncology

## 2019-10-14 DIAGNOSIS — C50012 Malignant neoplasm of nipple and areola, left female breast: Secondary | ICD-10-CM

## 2019-10-14 DIAGNOSIS — C50011 Malignant neoplasm of nipple and areola, right female breast: Secondary | ICD-10-CM

## 2019-10-14 DIAGNOSIS — G629 Polyneuropathy, unspecified: Secondary | ICD-10-CM

## 2019-10-14 MED FILL — traZODone HCL 50 MG TABS: 50 | 30 days supply | Qty: 60 | Fill #2

## 2019-10-14 MED FILL — LOSARTAN-HCTZ 100-25 MG TAB: 100-25 | 30 days supply | Qty: 30 | Fill #7

## 2019-10-14 MED FILL — GABAPENTIN 300 MG CAPSULE: 300 | 30 days supply | Qty: 180 | Fill #0

## 2019-10-26 DIAGNOSIS — C50912 Malignant neoplasm of unspecified site of left female breast: Secondary | ICD-10-CM | POA: Diagnosis not present

## 2019-10-26 DIAGNOSIS — C50911 Malignant neoplasm of unspecified site of right female breast: Secondary | ICD-10-CM | POA: Diagnosis not present

## 2019-10-26 MED FILL — IBUPROFEN 800 MG TAB: 800 | 30 days supply | Qty: 90 | Fill #2

## 2019-10-26 MED FILL — traMADol HCL 50 MG TABS: 50 | 30 days supply | Qty: 30 | Fill #2

## 2019-10-26 MED FILL — busPIRone HCL 5 MG TABS: 5 | 30 days supply | Qty: 30 | Fill #1

## 2019-10-26 MED FILL — PANTOPRAZOLE SOD DR 40 MG T: 40 | 30 days supply | Qty: 30 | Fill #2

## 2019-10-26 MED FILL — VENLAFAXINE HCL ER 150 MG C: 150 | 90 days supply | Qty: 90 | Fill #2

## 2019-10-26 MED FILL — valACYclovir HCL 1 GM TABS: 1 | 15 days supply | Qty: 30 | Fill #6

## 2019-10-26 MED FILL — CYCLOBENZAPRINE HCL 10 MG T: 10 | 30 days supply | Qty: 60 | Fill #2

## 2019-11-10 MED FILL — AMLODIPINE BESYLATE 5 MG TA: 5 | 90 days supply | Qty: 90 | Fill #1

## 2019-11-10 MED FILL — METFORMIN HCL 500 MG TABS: 500 | 30 days supply | Qty: 60 | Fill #1

## 2019-11-17 ENCOUNTER — Other Ambulatory Visit: Payer: Self-pay | Admitting: Oncology

## 2019-11-17 MED FILL — traZODone HCL 50 MG TABS: 50 | 30 days supply | Qty: 60 | Fill #3

## 2019-11-17 MED FILL — LOSARTAN-HCTZ 100-25 MG TAB: 100-25 | 30 days supply | Qty: 30 | Fill #8

## 2019-11-17 MED FILL — GABAPENTIN 300 MG CAPSULE: 300 | 30 days supply | Qty: 180 | Fill #1

## 2019-11-18 ENCOUNTER — Other Ambulatory Visit: Payer: Self-pay

## 2019-11-18 ENCOUNTER — Other Ambulatory Visit: Payer: Self-pay | Admitting: Oncology

## 2019-11-18 MED ORDER — POTASSIUM CHLORIDE ER 10 MEQ PO CPCR: 20.0000 meq | ORAL_CAPSULE | Freq: Two times a day (BID) | ORAL | 1 refills | Status: DC

## 2019-11-18 MED FILL — POTASSIUM CL ER 10 MEQ CAP: 10 | 45 days supply | Qty: 180 | Fill #0

## 2019-11-25 ENCOUNTER — Other Ambulatory Visit: Payer: Self-pay | Admitting: Internal Medicine

## 2019-11-25 ENCOUNTER — Other Ambulatory Visit: Payer: Self-pay | Admitting: Oncology

## 2019-11-25 DIAGNOSIS — C50412 Malignant neoplasm of upper-outer quadrant of left female breast: Secondary | ICD-10-CM

## 2019-11-25 DIAGNOSIS — Z170 Estrogen receptor positive status [ER+]: Secondary | ICD-10-CM

## 2019-11-25 DIAGNOSIS — C50211 Malignant neoplasm of upper-inner quadrant of right female breast: Secondary | ICD-10-CM

## 2019-11-25 MED FILL — PANTOPRAZOLE SOD DR 40 MG T: 40 | 30 days supply | Qty: 30 | Fill #0

## 2019-11-25 MED FILL — IBUPROFEN 800 MG TAB: 800 | 30 days supply | Qty: 90 | Fill #3

## 2019-11-26 MED FILL — CYCLOBENZAPRINE HCL 10 MG T: 10 | 30 days supply | Qty: 60 | Fill #0

## 2019-11-26 MED FILL — traMADol HCL 50 MG TABS: 50 | 30 days supply | Qty: 30 | Fill #0

## 2019-11-26 NOTE — Telephone Encounter (Signed)
Check [place] registry last filled Tramadol 10/26/2019.Marland KitchenJohny Chess

## 2019-12-01 ENCOUNTER — Other Ambulatory Visit: Payer: Self-pay

## 2019-12-01 ENCOUNTER — Other Ambulatory Visit: Payer: Self-pay | Admitting: Internal Medicine

## 2019-12-01 ENCOUNTER — Ambulatory Visit (INDEPENDENT_AMBULATORY_CARE_PROVIDER_SITE_OTHER): Payer: Medicare Other

## 2019-12-01 ENCOUNTER — Encounter: Payer: Self-pay | Admitting: Internal Medicine

## 2019-12-01 ENCOUNTER — Ambulatory Visit (INDEPENDENT_AMBULATORY_CARE_PROVIDER_SITE_OTHER): Payer: Medicare Other | Admitting: Internal Medicine

## 2019-12-01 VITALS — BP 124/82 | HR 100 | Temp 98.6°F | Ht 69.0 in | Wt 259.0 lb

## 2019-12-01 DIAGNOSIS — M5416 Radiculopathy, lumbar region: Secondary | ICD-10-CM

## 2019-12-01 DIAGNOSIS — N3941 Urge incontinence: Secondary | ICD-10-CM | POA: Diagnosis not present

## 2019-12-01 DIAGNOSIS — M25552 Pain in left hip: Secondary | ICD-10-CM

## 2019-12-01 DIAGNOSIS — E1169 Type 2 diabetes mellitus with other specified complication: Secondary | ICD-10-CM

## 2019-12-01 DIAGNOSIS — M5116 Intervertebral disc disorders with radiculopathy, lumbar region: Secondary | ICD-10-CM | POA: Diagnosis not present

## 2019-12-01 DIAGNOSIS — M25551 Pain in right hip: Secondary | ICD-10-CM

## 2019-12-01 LAB — COMPREHENSIVE METABOLIC PANEL
ALT: 41 U/L — ABNORMAL HIGH (ref 0–35)
AST: 27 U/L (ref 0–37)
Albumin: 4.2 g/dL (ref 3.5–5.2)
Alkaline Phosphatase: 93 U/L (ref 39–117)
BUN: 20 mg/dL (ref 6–23)
CO2: 30 mEq/L (ref 19–32)
Calcium: 9.9 mg/dL (ref 8.4–10.5)
Chloride: 101 mEq/L (ref 96–112)
Creatinine, Ser: 0.81 mg/dL (ref 0.40–1.20)
GFR: 87.7 mL/min (ref >60.00)
Glucose, Bld: 177 mg/dL — ABNORMAL HIGH (ref 70–99)
Potassium: 3.4 mEq/L — ABNORMAL LOW (ref 3.5–5.1)
Sodium: 137 mEq/L (ref 135–145)
Total Bilirubin: 0.3 mg/dL (ref 0.2–1.2)
Total Protein: 7.9 g/dL (ref 6.0–8.3)

## 2019-12-01 LAB — LIPID PANEL
Cholesterol: 161 mg/dL (ref 0–200)
HDL: 31.3 mg/dL — ABNORMAL LOW (ref >39.00)
LDL Cholesterol: 94 mg/dL (ref 0–99)
NonHDL: 129.76
Total CHOL/HDL Ratio: 5
Triglycerides: 181 mg/dL — ABNORMAL HIGH (ref 0.0–149.0)
VLDL: 36.2 mg/dL (ref 0.0–40.0)

## 2019-12-01 LAB — HEMOGLOBIN A1C: Hgb A1c MFr Bld: 8.9 % — ABNORMAL HIGH (ref 4.6–6.5)

## 2019-12-01 LAB — MICROALBUMIN / CREATININE URINE RATIO
Creatinine,U: 163 mg/dL
Microalb Creat Ratio: 1.1 mg/g (ref 0.0–30.0)
Microalb, Ur: 1.8 mg/dL (ref 0.0–1.9)

## 2019-12-01 MED FILL — ANASTROZOLE 1 MG TABLET: 1 | 90 days supply | Qty: 90 | Fill #3

## 2019-12-01 MED FILL — valACYclovir HCL 1 GM TABS: 1 | 45 days supply | Qty: 90 | Fill #0

## 2019-12-01 NOTE — Assessment & Plan Note (Signed)
No saddle anaesthesia and will fill out forms for her incontinence supplies. Offered medical options but she does not want to pursue at this time.

## 2019-12-01 NOTE — Patient Instructions (Signed)
We will get the labs and the x-ray today to see what is going on with the back and the hips.

## 2019-12-01 NOTE — Assessment & Plan Note (Signed)
Needs follow up as previous labs with oncology are 7 months ago and were not controlled. She is taking metformin 500 mg BID and likely needs progression of therapy. She is having neuropathy started with chemotherapy and this is progressive some as well.

## 2019-12-01 NOTE — Assessment & Plan Note (Signed)
Checking x-rays both hips due to new/worsening pain in the hips. Could be related to back or trochanteric bursitis. Treat as appropriate after imaging.

## 2019-12-01 NOTE — Progress Notes (Signed)
   Subjective:   Patient ID: Margaret [name], female    DOB: 07-26-1961, 59 y.o.   MRN: [id number]  HPI The patient is a 59 YO female coming in for new concerns about bladder issues (unable to make it to the bathroom, used pads/pullups for some years now, not on medications for this, overall worsening, night time uses these due to not waking up and peeing in the bed, denies pain with urination, denies change in sensation and knows when she has to urinate but unable to get there in time, large volume loss) and hip pain (bilateral but left more than right, tender to the touch on the sides of the hips, worse with lying on them in bed, using ibuprofen, tramadol, heating pads) and left leg numbness (with lower back pain, started with tingling in the left thigh region, now sometimes numb, hurting in the low back and down to the knee, denies falls or injury, started months ago, overall fairly stable but hurting, working 2 days per week causes her to need to rest for at least 4 days to feel better afterwards).   Review of Systems  Constitutional: Positive for activity change.  HENT: Negative.   Eyes: Negative.   Respiratory: Negative for cough, chest tightness and shortness of breath.   Cardiovascular: Negative for chest pain, palpitations and leg swelling.  Gastrointestinal: Negative for abdominal distention, abdominal pain, constipation, diarrhea, nausea and vomiting.  Genitourinary: Positive for enuresis, frequency and urgency.  Musculoskeletal: Positive for arthralgias, back pain, gait problem and myalgias.  Skin: Negative.   Neurological: Positive for numbness. Negative for dizziness and light-headedness.  Psychiatric/Behavioral: Negative.     Objective:  Physical Exam Constitutional:      Appearance: She is well-developed. She is obese.  HENT:     Head: Normocephalic and atraumatic.  Cardiovascular:     Rate and Rhythm: Normal rate and regular rhythm.  Pulmonary:     Effort: Pulmonary  effort is normal. No respiratory distress.     Breath sounds: Normal breath sounds. No wheezing or rales.  Abdominal:     General: Bowel sounds are normal. There is no distension.     Palpations: Abdomen is soft.     Tenderness: There is no abdominal tenderness. There is no rebound.  Musculoskeletal:        General: Tenderness present.     Cervical back: Normal range of motion.     Comments: Pain lumbar SI region, lateral thighs right and left, left more than right  Skin:    General: Skin is warm and dry.  Neurological:     Mental Status: She is alert and oriented to person, place, and time.     Coordination: Coordination abnormal.     Comments: Slow to stand and start, gait steady once walking     Vitals:   12/01/19 1015  BP: 124/82  Pulse: 100  Temp: 98.6 F (37 C)  SpO2: 98%  Weight: 259 lb (117.5 kg)  Height: 5\' 9"  (1.753 m)    This visit occurred during the SARS-CoV-2 public health emergency.  Safety protocols were in place, including screening questions prior to the visit, additional usage of staff PPE, and extensive cleaning of exam room while observing appropriate contact time as indicated for disinfecting solutions.   Assessment & Plan:

## 2019-12-02 DIAGNOSIS — C50912 Malignant neoplasm of unspecified site of left female breast: Secondary | ICD-10-CM | POA: Diagnosis not present

## 2019-12-02 DIAGNOSIS — C50911 Malignant neoplasm of unspecified site of right female breast: Secondary | ICD-10-CM | POA: Diagnosis not present

## 2019-12-09 ENCOUNTER — Telehealth: Payer: Self-pay

## 2019-12-09 NOTE — Telephone Encounter (Signed)
New message    Seen on  4.27.2021 was advise by Dr. Sharlet Salina that a steroid medication would be called into [facility] along with another DM medication.   The patient voiced no medication has not been sent in checking on the status, asking for a call back from the [place].   [place], [place]

## 2019-12-09 NOTE — Telephone Encounter (Signed)
Please advise 

## 2019-12-10 ENCOUNTER — Other Ambulatory Visit: Payer: Self-pay | Admitting: Internal Medicine

## 2019-12-10 ENCOUNTER — Telehealth: Payer: Self-pay

## 2019-12-10 DIAGNOSIS — E119 Type 2 diabetes mellitus without complications: Secondary | ICD-10-CM

## 2019-12-10 MED ORDER — METFORMIN HCL 1000 MG PO TABS: 1000.0000 mg | ORAL_TABLET | Freq: Two times a day (BID) | ORAL | 1 refills | Status: DC

## 2019-12-10 MED FILL — METFORMIN HCL 1000 MG TABS: 1000 | 90 days supply | Qty: 180 | Fill #0

## 2019-12-10 NOTE — Telephone Encounter (Signed)
Result Communications   Result Notes   Margaret [name] [facility]  12/03/2019 3:26 PM EDT    Yes, she agrees to the medication change   Hoyt Koch, MD  12/03/2019 7:15 AM EDT    Does she agree to medication change?   Margaret [name], Margaret [name]  12/02/2019 3:12 PM EDT    Appointment scheduled for 03-10-2020 for follow up visit   Hoyt Koch, MD  12/02/2019 8:01 AM EDT    Please call and let her know that the sugar levels are higher than before with the HgA1c at 8.9 (should be around 7.5). We should either increase the dose of her metformin or add another medicine for the sugars to help get them under control and need to see her back in the office to recheck and discuss her sugars in 3 months.

## 2019-12-10 NOTE — Telephone Encounter (Addendum)
It was 7 days ago, and yes she wants to increase the metformin.  Please see other note just sent

## 2019-12-10 NOTE — Telephone Encounter (Signed)
Called and left message for patient today with info. 

## 2019-12-10 NOTE — Telephone Encounter (Signed)
Sent in metformin 1000 mg tablets to take twice a day. She can take 2 pills twice a day of what she currently has (500 mg tablets).

## 2019-12-10 NOTE — Telephone Encounter (Signed)
Is she wanting to try that? I never got a result note back about that question. Also now this is several weeks ago is she still having pain?

## 2019-12-23 MED FILL — LOSARTAN-HCTZ 100-25 MG TAB: 100-25 | 30 days supply | Qty: 30 | Fill #9

## 2019-12-23 MED FILL — PANTOPRAZOLE SOD DR 40 MG T: 40 | 30 days supply | Qty: 30 | Fill #1

## 2019-12-23 MED FILL — traZODone HCL 50 MG TABS: 50 | 30 days supply | Qty: 60 | Fill #4

## 2020-01-05 DIAGNOSIS — M545 Low back pain: Secondary | ICD-10-CM | POA: Diagnosis not present

## 2020-01-06 ENCOUNTER — Other Ambulatory Visit: Payer: Self-pay | Admitting: Oncology

## 2020-01-06 MED FILL — CYCLOBENZAPRINE HCL 10 MG T: 10 | 30 days supply | Qty: 60 | Fill #1

## 2020-01-06 MED FILL — busPIRone HCL 5 MG TABS: 5 | 30 days supply | Qty: 30 | Fill #2

## 2020-01-07 ENCOUNTER — Other Ambulatory Visit: Payer: Self-pay | Admitting: *Deleted

## 2020-01-07 MED ORDER — IBUPROFEN 800 MG PO TABS: 800.0000 mg | ORAL_TABLET | Freq: Three times a day (TID) | ORAL | 3 refills | Status: DC | PRN

## 2020-01-07 MED FILL — IBUPROFEN 800 MG TAB: 800 | 30 days supply | Qty: 90 | Fill #0

## 2020-01-19 DIAGNOSIS — M545 Low back pain: Secondary | ICD-10-CM | POA: Diagnosis not present

## 2020-01-21 MED FILL — PANTOPRAZOLE SOD DR 40 MG T: 40 | 30 days supply | Qty: 30 | Fill #2

## 2020-01-21 MED FILL — LOSARTAN-HCTZ 100-25 MG TAB: 100-25 | 30 days supply | Qty: 30 | Fill #10

## 2020-01-21 MED FILL — POTASSIUM CL ER 10 MEQ CAP: 10 | 45 days supply | Qty: 180 | Fill #1

## 2020-01-21 MED FILL — traZODone HCL 50 MG TABS: 50 | 30 days supply | Qty: 60 | Fill #5

## 2020-01-25 ENCOUNTER — Other Ambulatory Visit: Payer: Self-pay | Admitting: Oncology

## 2020-01-25 DIAGNOSIS — C50011 Malignant neoplasm of nipple and areola, right female breast: Secondary | ICD-10-CM

## 2020-01-25 DIAGNOSIS — G629 Polyneuropathy, unspecified: Secondary | ICD-10-CM

## 2020-01-25 DIAGNOSIS — C50012 Malignant neoplasm of nipple and areola, left female breast: Secondary | ICD-10-CM

## 2020-01-25 MED FILL — VENLAFAXINE HCL ER 150 MG C: 150 | 90 days supply | Qty: 90 | Fill #3

## 2020-01-25 MED FILL — GABAPENTIN 300 MG CAPSULE: 300 | 30 days supply | Qty: 180 | Fill #0

## 2020-01-25 MED FILL — traMADol HCL 50 MG TABS: 50 | 30 days supply | Qty: 30 | Fill #1

## 2020-02-02 DIAGNOSIS — E119 Type 2 diabetes mellitus without complications: Secondary | ICD-10-CM | POA: Diagnosis not present

## 2020-02-02 DIAGNOSIS — H524 Presbyopia: Secondary | ICD-10-CM | POA: Diagnosis not present

## 2020-02-02 DIAGNOSIS — H5202 Hypermetropia, left eye: Secondary | ICD-10-CM | POA: Diagnosis not present

## 2020-02-02 DIAGNOSIS — H52223 Regular astigmatism, bilateral: Secondary | ICD-10-CM | POA: Diagnosis not present

## 2020-02-02 DIAGNOSIS — H5211 Myopia, right eye: Secondary | ICD-10-CM | POA: Diagnosis not present

## 2020-02-02 LAB — HM DIABETES EYE EXAM

## 2020-02-03 DIAGNOSIS — H524 Presbyopia: Secondary | ICD-10-CM | POA: Diagnosis not present

## 2020-02-03 DIAGNOSIS — H5213 Myopia, bilateral: Secondary | ICD-10-CM | POA: Diagnosis not present

## 2020-02-08 ENCOUNTER — Other Ambulatory Visit: Payer: Self-pay | Admitting: Oncology

## 2020-02-08 DIAGNOSIS — C50412 Malignant neoplasm of upper-outer quadrant of left female breast: Secondary | ICD-10-CM

## 2020-02-08 DIAGNOSIS — Z170 Estrogen receptor positive status [ER+]: Secondary | ICD-10-CM

## 2020-02-08 MED FILL — AMLODIPINE BESYLATE 5 MG TA: 5 | 90 days supply | Qty: 90 | Fill #2

## 2020-02-08 MED FILL — IBUPROFEN 800 MG TAB: 800 | 30 days supply | Qty: 90 | Fill #1

## 2020-02-13 MED FILL — PANTOPRAZOLE SOD DR 40 MG T: 40 | 30 days supply | Qty: 30 | Fill #0

## 2020-02-19 MED FILL — LOSARTAN-HCTZ 100-25 MG TAB: 100-25 | 30 days supply | Qty: 30 | Fill #11

## 2020-02-22 ENCOUNTER — Telehealth: Payer: Self-pay | Admitting: Internal Medicine

## 2020-02-22 NOTE — Telephone Encounter (Signed)
    Patient prescription for glucometer, lancets, test strips  [place], [place]

## 2020-02-23 MED ORDER — BLOOD GLUCOSE METER KIT: PACK | 0 refills | Status: DC

## 2020-02-23 MED FILL — ONETOUCH DELICA PLUS LANCET: 33 days supply | Qty: 100 | Fill #0

## 2020-02-23 MED FILL — ONETOUCH VERIO TEST STRIP: 33 days supply | Qty: 100 | Fill #0

## 2020-02-23 MED FILL — ONETOUCH VERIO FLEX SYSTEM: W/DEVICE | 1 days supply | Qty: 1 | Fill #0

## 2020-02-24 ENCOUNTER — Other Ambulatory Visit: Payer: Self-pay | Admitting: Oncology

## 2020-02-24 MED FILL — valACYclovir HCL 1 GM TABS: 1 | 45 days supply | Qty: 90 | Fill #0

## 2020-02-25 MED FILL — traMADol HCL 50 MG TABS: 50 | 30 days supply | Qty: 30 | Fill #2

## 2020-03-01 MED FILL — GABAPENTIN 300 MG CAPSULE: 300 | 30 days supply | Qty: 180 | Fill #1

## 2020-03-01 MED FILL — ANASTROZOLE 1 MG TABLET: 1 | 90 days supply | Qty: 90 | Fill #0

## 2020-03-04 MED FILL — CYCLOBENZAPRINE HCL 10 MG T: 10 | 30 days supply | Qty: 60 | Fill #2

## 2020-03-09 NOTE — Progress Notes (Signed)
[facility]  [phone] [phone]   ID: Margaret [name] DOB: March 16, 1961  MR#: [id number]  PRF#:[id number]  Patient Care Team: Hoyt Koch, MD as PCP - General (Internal Medicine) Jovita Kussmaul, MD as Consulting Physician (General Surgery) Charlei Ramsaran, Virgie Dad, MD as Consulting Physician (Oncology) Gery Pray, MD as Consulting Physician (Radiation Oncology) Mauro Kaufmann, RN as Registered Nurse Rockwell Germany, RN as Registered Nurse Benson Norway, RN as Registered Nurse Rosemarie Ax, MD as Consulting Physician (Family Medicine) OTHER MD:   CHIEF COMPLAINT:  estrogen receptor positive breast cancer; (s/p bilateral mastectomies)  CURRENT TREATMENT: anastrozole   INTERVAL HISTORY: Margaret [name] returns today for follow-up of her estrogen receptor positive breast cancer.   She continues on anastrozole.  She has significant problems with hot flashes despite being on Effexor.  She is taking some over-the-counter medication that she says is helping but she does not recall the name.  Vaginal dryness is not a major issue.  There is no bone density screening on file.   She is status post bilateral mastectomies, and thus does not require annual mammography.  On 12/01/2019 she had bilateral hip and lumbar spine films which show no evidence of cancer, some evidence of arthritis.   REVIEW OF SYSTEMS:  Margaret [name] has been found to have very high blood sugars.  She is working closely with her primary care physician on this.  Her Metformin was recently dose increased and now she is having watery bowel movements several times a day which are limiting to her.  She is not on a diabetic diet.  She received the Eddy vaccine x2 without any complications.  A detailed review of systems today was otherwise stable.   BREAST CANCER HISTORY: From the original intake note:  Margaret [name]'s primary care physician retired sometime ago and her health maintenance has not  been up-to-date. She has been receiving medical care through the emergency room and was seen in March 2015 following a head injury, then in August 2015 for lancing of an abscess. In December 2015 she noticed that her left breast looked a little bit smaller than her right. She brought this to the attention of friends and family over the next several months but the general feeling was that most people are not perfectly symmetrical. By the summer of this year she noticed that her nipple on the left was "going in". More recently she started developing pain in the left breast. She was evaluated for this in the emergency room 04/09/2015 at which time a left breast exam showed a deformed left breast with a large hard mass encompassing most of the breast, with nipple retraction. There was no nipple discharge or bleeding and no palpable adenopathy.  The patient was referred to [facility] and on 04/13/2015 she underwent bilateral diagnostic mammography with tomosynthesis as well as bilateral breast ultrasound. The breast density was category B. In the right breast at the 11:00 position there was a 1.3 cm irregular mass which by ultrasonography measured 1.3 cm.--In the left breast there was a 4 cm irregular mass at the 2:00 position associated with nipple retraction. There was a second, 1.7 cm area of architectural distortion at the 9:00 position. Both were palpable. By ultrasonography, the 4 cm irregular mass was noted, with a second mass measuring 2.5 cm by ultrasonography. Both axillae were benign.  On 04/19/2015 the patient underwent right breast upper outer quadrant biopsy, showing ([id number]) and invasive ductal carcinoma, grade 1, estrogen receptor 90%  positive, progesterone receptor negative, with an MIB-1 of 5%, and no HER-2 amplification, the signals ratio being 1.26 and the number per cell 2.20.  On the same day, she underwent biopsy of the 2 left breast masses in question as well as a suspicious left axillary  lymph node. All 3 biopsies showed invasive ductal carcinoma, grade 2, estrogen receptor 80-100% positive, progesterone receptor 80-100% positive, with the MIB-1 between 5 and 10%, and no HER-2 amplification, the signals ratio being between 1.05 and 1.13, and the number per cell between 2.10 and 2.25.  The patient's subsequent history is as detailed below   PAST MEDICAL HISTORY: Past Medical History:  Diagnosis Date  . Allergy   . Anxiety   . Arthritis   . Bilateral breast cancer ([place])   . Breast cancer ([place])   . Breast cancer of upper-outer quadrant of left female breast ([place]) 04/21/2015  . Depression   . Diabetes mellitus without complication ([place])   . GERD (gastroesophageal reflux disease)   . Hypertension     PAST SURGICAL HISTORY: Past Surgical History:  Procedure Laterality Date  . ABDOMINAL HYSTERECTOMY  1990  . MASTECTOMY MODIFIED RADICAL Left 05/09/2015  . MASTECTOMY MODIFIED RADICAL Left 05/09/2015   Procedure: LEFT MODIFIED RADICAL MASTETCTOMY;  Surgeon: Autumn Messing III, MD;  Location: [place];  Service: General;  Laterality: Left;  Marland Kitchen MASTECTOMY W/ SENTINEL NODE BIOPSY Right   . MASTECTOMY W/ SENTINEL NODE BIOPSY Right 05/09/2015   Procedure: RIGHT MASTECTOMY WITH RIGHT AXILLARY SENTINEL LYMPH NODE BIOPSY;  Surgeon: Autumn Messing III, MD;  Location: [place];  Service: General;  Laterality: Right;  . PORTACATH PLACEMENT Right 09/01/2015   Procedure: INSERTION PORT-A-CATH;  Surgeon: Autumn Messing III, MD;  Location: [place];  Service: General;  Laterality: Right;  . TUBAL LIGATION  1984    FAMILY HISTORY Family History  Problem Relation Age of Onset  . Hypertension Mother   . Diabetes Mother   . Colon cancer Neg Hx   . Esophageal cancer Neg Hx   . Rectal cancer Neg Hx   . Stomach cancer Neg Hx     the patient has little information on her father. Her mother is living, currently age 59. She has one brother, 2 sisters. There is no history of breast or ovarian cancer  in the family to her knowledge.    GYNECOLOGIC HISTORY:  No LMP recorded. Patient has had a hysterectomy.  menarche age 73, first live birth age 4. She is GX P4. She underwent a total abdominal hysterectomy with bilateral salpingo-oophorectomy at age 36. She did not take hormone replacement. She never used oral contraceptives.    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 She previously worked the same position at [employer] for 25+ years. She is single and lives alone  The patient's daughter, [name], works as a Programmer, applications, as does her daughter [name]. Daughter [name] works at [employer], as a call. All 3 live in [place]. The patient's son Marc Morgans Clapper's lives in [place]. He prepares sets for shows The patient has 4 grandchildren, no great grandchildren. She attends a [employer] in [place].    ADVANCED DIRECTIVES: Not in place. At the initial clinic visit the patient was given the appropriate forms to complete and notarize at her discretion. The patient intends to name her daughter Sampson Si as healthcare power of attorney.  She can be reached at [phone].   HEALTH MAINTENANCE: Social History   Tobacco Use  . Smoking status: Former Smoker    Packs/day: 0.10    Years: 28.00    Pack years: 2.80    Quit date: 09/02/2018    Years since quitting: 1.5  . Smokeless tobacco: Never Used  Vaping Use  . Vaping Use: Never used  Substance Use Topics  . Alcohol use: Yes    Alcohol/week: 0.0 standard drinks    Comment: Socially  . Drug use: No     Colonoscopy: Never  PAP: Status post hysterectomy  Bone density: Never  Lipid panel:  Allergies  Allergen Reactions  . Banana Hives    Tongue itching  . Latex Itching and Other (See Comments)    burning  . 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 Wheat Bran Hives     Current Outpatient Medications  Medication Sig Dispense Refill  . amLODipine (NORVASC) 5 MG tablet Take 1 tablet (5 mg total) by mouth daily. 90 tablet 6  . anastrozole (ARIMIDEX) 1 MG tablet Take 1 tablet (1 mg total) by mouth daily. 90 tablet 4  . blood glucose meter kit and supplies Dispense based on patient and insurance preference. Use up to three times daily as directed. (FOR ICD-10 E10.9, E11.9). 1 each 0  . busPIRone (BUSPAR) 5 MG tablet TAKE 1 TABLET BY MOUTH ONCE A DAY AS NEEDED FOR ANXIETY 30 tablet 2  . calcium-vitamin D (OSCAL WITH D) 500-200 MG-UNIT tablet Take 1 tablet by mouth 2 (two) times daily. 60 tablet 0  . cyclobenzaprine (FLEXERIL) 10 MG tablet TAKE 1 TABLET BY MOUTH TWICE DAILY AS NEEDED FOR MUSCLE SPASMS 60 tablet 2  . diphenhydrAMINE (BENADRYL) 25 mg capsule Take 1 capsule (25 mg total) by mouth every 6 (six) hours as needed. (Patient taking differently: Take 25 mg by mouth every 6 (six) hours as needed for allergies. ) 100 capsule 3  . gabapentin (NEURONTIN) 300 MG capsule TAKE 2 CAPSULES BY MOUTH THREE TIMES DAILY 180 capsule 1  . ibuprofen (ADVIL) 800 MG tablet Take 1 tablet (800 mg total) by mouth every 8 (eight) hours as needed. 90 tablet 3  . losartan-hydrochlorothiazide (HYZAAR) 100-25 MG tablet Take 1 tablet by mouth daily. 90 tablet 3  . metFORMIN (GLUCOPHAGE) 1000 MG tablet Take 1 tablet (1,000 mg total) by mouth 2 (two) times daily. 180 tablet 1  . Multiple Vitamins-Minerals (CENTRUM SILVER 50+WOMEN PO) Take 1 tablet by mouth daily.    . pantoprazole (PROTONIX) 40 MG tablet TAKE 1 TABLET BY MOUTH ONCE A DAY 30 tablet 2  . potassium chloride (MICRO-K) 10 MEQ CR capsule Take 2 capsules (20 mEq total) by mouth 2 (two) times daily. 180 capsule 1  . traMADol (ULTRAM) 50 MG tablet TAKE 1 TABLET BY MOUTH ONCE DAILY AS NEEDED 30 tablet 2  . traZODone (DESYREL) 50 MG tablet 1 po qhs may increase to 2 tabs if needed. 60 tablet 1  . valACYclovir (VALTREX) 1000 MG  tablet TAKE 1 TABLET BY MOUTH 2 TIMES DAILY. 90 tablet 4  . venlafaxine XR (EFFEXOR-XR) 150 MG 24 hr capsule Take 1 capsule (150 mg total) by mouth daily with breakfast. 90 capsule 3   No current facility-administered medications for this visit.     OBJECTIVE:  African-American woman who appears stated age  Vitals:   03/10/20 0924  BP: (!) 142/97  Pulse: 89  Resp:  20  Temp: 98 F (36.7 C)  SpO2: 98%   Wt Readings from Last 3 Encounters:  03/10/20 260 lb 6.4 oz (118.1 kg)  12/01/19 259 lb (117.5 kg)  04/15/19 267 lb 12.8 oz (121.5 kg)   Body mass index is 38.45 kg/m.    ECOG FS:1 - Symptomatic but completely ambulatory  Sclerae unicteric, EOMs intact Wearing a mask No cervical or supraclavicular adenopathy Lungs no rales or rhonchi Heart regular rate and rhythm Abd soft, obese, nontender, positive bowel sounds MSK no focal spinal tenderness, no upper extremity lymphedema Neuro: nonfocal, well oriented, appropriate affect Breasts: Status post bilateral mastectomies.  There is no evidence of chest wall recurrence.  Both axillae are benign.   LAB RESULTS:  CMP     Component Value Date/Time   NA 137 12/01/2019 1041   NA 142 07/24/2017 1407   K 3.4 (L) 12/01/2019 1041   K 3.3 (L) 07/24/2017 1407   CL 101 12/01/2019 1041   CO2 30 12/01/2019 1041   CO2 26 07/24/2017 1407   GLUCOSE 177 (H) 12/01/2019 1041   GLUCOSE 110 07/24/2017 1407   BUN 20 12/01/2019 1041   BUN 13.4 07/24/2017 1407   CREATININE 0.81 12/01/2019 1041   CREATININE 1.06 (H) 04/15/2019 1447   CREATININE 1.0 07/24/2017 1407   CALCIUM 9.9 12/01/2019 1041   CALCIUM 9.6 07/24/2017 1407   PROT 7.9 12/01/2019 1041   PROT 8.5 (H) 07/24/2017 1407   ALBUMIN 4.2 12/01/2019 1041   ALBUMIN 3.9 07/24/2017 1407   AST 27 12/01/2019 1041   AST 25 04/15/2019 1447   AST 23 07/24/2017 1407   ALT 41 (H) 12/01/2019 1041   ALT 35 04/15/2019 1447   ALT 28 07/24/2017 1407   ALKPHOS 93 12/01/2019 1041   ALKPHOS 84  07/24/2017 1407   BILITOT 0.3 12/01/2019 1041   BILITOT <0.2 (L) 04/15/2019 1447   BILITOT 0.30 07/24/2017 1407   GFRNONAA 58 (L) 04/15/2019 1447   GFRAA >60 04/15/2019 1447    INo results found for: SPEP, UPEP  Lab Results  Component Value Date   WBC 10.6 (H) 03/10/2020   NEUTROABS 6.5 03/10/2020   HGB 13.0 03/10/2020   HCT 39.7 03/10/2020   MCV 91.9 03/10/2020   PLT 206 03/10/2020      Chemistry      Component Value Date/Time   NA 137 12/01/2019 1041   NA 142 07/24/2017 1407   K 3.4 (L) 12/01/2019 1041   K 3.3 (L) 07/24/2017 1407   CL 101 12/01/2019 1041   CO2 30 12/01/2019 1041   CO2 26 07/24/2017 1407   BUN 20 12/01/2019 1041   BUN 13.4 07/24/2017 1407   CREATININE 0.81 12/01/2019 1041   CREATININE 1.06 (H) 04/15/2019 1447   CREATININE 1.0 07/24/2017 1407   GLU 157 (H) 08/17/2016 0818      Component Value Date/Time   CALCIUM 9.9 12/01/2019 1041   CALCIUM 9.6 07/24/2017 1407   ALKPHOS 93 12/01/2019 1041   ALKPHOS 84 07/24/2017 1407   AST 27 12/01/2019 1041   AST 25 04/15/2019 1447   AST 23 07/24/2017 1407   ALT 41 (H) 12/01/2019 1041   ALT 35 04/15/2019 1447   ALT 28 07/24/2017 1407   BILITOT 0.3 12/01/2019 1041   BILITOT <0.2 (L) 04/15/2019 1447   BILITOT 0.30 07/24/2017 1407       No results found for: LABCA2  No components found for: TOIZT245  No results for input(s): INR in the last 168  hours.  Urinalysis    Component Value Date/Time   COLORURINE YELLOW 06/29/2015 1030   APPEARANCEUR CLOUDY (A) 06/29/2015 1030   LABSPEC 1.019 06/29/2015 1030   PHURINE 7.0 06/29/2015 1030   GLUCOSEU NEGATIVE 06/29/2015 1030   HGBUR TRACE (A) 06/29/2015 1030   BILIRUBINUR NEGATIVE 06/29/2015 1030   KETONESUR NEGATIVE 06/29/2015 1030   PROTEINUR NEGATIVE 06/29/2015 1030   NITRITE NEGATIVE 06/29/2015 1030   LEUKOCYTESUR MODERATE (A) 06/29/2015 1030    STUDIES: No results found.   ASSESSMENT: 59 y.o. [place] woman with synchronous breast cancers,  as follows  (a) status post right breast upper outer quadrant biopsy 04/19/2015 for a clinical pT1c N0, stage IA invasive ductal carcinoma, grade 1, estrogen receptor positive, progesterone receptor negative, with an MIB-1 of 5%, and no HER-2 amplification  (b) status post left breast upper outer quadrant biopsy 2 and left axillary lymph node biopsy 04/19/2015 for a clinical T3 N1, stage IIIA invasive ductal carcinoma, grade 1 or 2, estrogen receptor and progesterone receptor positive, HER-2 negative, with an MIB-1 between 5 and 10%  (1) status post bilateral mastectomies 05/09/2015, showing:   (a) on the right side, a pT2 pN0, stage IIA invasive ductal carcinoma, with negative margins and repeat HER-2 again negative    (b) on the left, a pT3 pN2, stage IIIA invasive ductal carcinoma, grade 2, with negative margins, and repeat HER-2 again negative  (2) Mammaprint from the left-sided tumor returned "luminal type, low risk", predicting a small benefit from chemotherapy with the important caveat that N2 disease was not included in the MINDACT study--patient opted for chemotherapy  (3) Oncotype from the right-sided tumor showed a score of 12, predicting an 8% risk of recurrence outside the breast within the next 10 years, if the patient's only systemic therapy was tamoxifen for 5 years. It also predicted no significant benefit from chemotherapy  (4) postmastectomy radiation to the left chest wall completed 08/15/2015  (5) adjuvant chemotherapy consisting of cyclophosphamide and docetaxel x4 completed on 11/21/15.  (5) anastrozole started 02/08/2016  (6) left upper extremity lymphedema  (7) signed for B-WELL study 02/08/2016  (8) participating in Armstrong trial as of August 2017  (a) randomized to palbociclib, starting October 2017  (b) completed 2 years of palbociclib October 2019, with no dose reductions necessary   PLAN: Margaret [name] is now close to 5 years out from definitive surgery for her  breast cancer with no evidence of disease recurrence.  This is very favorable.  She is tolerating anastrozole moderately well.  Hot flashes continue to be a problem.  She is on Effexor for this with some relief.  In addition she is having some discomfort at the surgical site left side greater than right.  This is not new.  It is simply post surgical discomfort.  She will use over-the-counter analgesics and ice as needed for this.  The plan is to continue anastrozole and minimum of 5 years.  Her diabetes remains poorly controlled.  She is just learning about diet and exercise and her primary care physician is working to find a treatment protocol for her that she can tolerate.  She will be seeing her soon to discuss Metformin which is causing Margaret [name] significant diarrhea  She will see [place] again a year from now.  She knows to call for any other issues that may develop before then.  Total encounter time 30 minutes.*  Twan Harkin, Virgie Dad, MD  03/10/20 9:54 AM Medical Oncology and Hematology [facility] [address], [place] [address] Tel. [phone]    Fax. [phone]   I, Wilburn Mylar, am acting as scribe for Dr. Virgie Dad. Margaret [name].  I, Lurline Del MD, have reviewed the above documentation for accuracy and completeness, and I agree with the above.    *Total Encounter Time as defined by the Centers for Medicare and Medicaid Services includes, in addition to the face-to-face time of a patient visit (documented in the note above) non-face-to-face time: obtaining and reviewing outside history, ordering and reviewing medications, tests or procedures, care coordination (communications with other health care professionals or caregivers) and documentation in the medical record.

## 2020-03-10 ENCOUNTER — Inpatient Hospital Stay: Payer: Medicare Other | Attending: Oncology | Admitting: Oncology

## 2020-03-10 ENCOUNTER — Other Ambulatory Visit: Payer: Self-pay | Admitting: *Deleted

## 2020-03-10 ENCOUNTER — Inpatient Hospital Stay: Payer: Medicare Other

## 2020-03-10 ENCOUNTER — Ambulatory Visit: Payer: Medicare Other | Admitting: Internal Medicine

## 2020-03-10 ENCOUNTER — Encounter: Payer: Self-pay | Admitting: *Deleted

## 2020-03-10 ENCOUNTER — Other Ambulatory Visit: Payer: Self-pay

## 2020-03-10 VITALS — BP 142/97 | HR 89 | Temp 98.0°F | Resp 20 | Ht 69.0 in | Wt 260.4 lb

## 2020-03-10 DIAGNOSIS — Z170 Estrogen receptor positive status [ER+]: Secondary | ICD-10-CM

## 2020-03-10 DIAGNOSIS — Z7984 Long term (current) use of oral hypoglycemic drugs: Secondary | ICD-10-CM | POA: Diagnosis not present

## 2020-03-10 DIAGNOSIS — C50412 Malignant neoplasm of upper-outer quadrant of left female breast: Secondary | ICD-10-CM | POA: Diagnosis not present

## 2020-03-10 DIAGNOSIS — Z79811 Long term (current) use of aromatase inhibitors: Secondary | ICD-10-CM | POA: Diagnosis not present

## 2020-03-10 DIAGNOSIS — N951 Menopausal and female climacteric states: Secondary | ICD-10-CM | POA: Insufficient documentation

## 2020-03-10 DIAGNOSIS — I890 Lymphedema, not elsewhere classified: Secondary | ICD-10-CM | POA: Diagnosis not present

## 2020-03-10 DIAGNOSIS — Z9221 Personal history of antineoplastic chemotherapy: Secondary | ICD-10-CM | POA: Insufficient documentation

## 2020-03-10 DIAGNOSIS — Z90722 Acquired absence of ovaries, bilateral: Secondary | ICD-10-CM | POA: Insufficient documentation

## 2020-03-10 DIAGNOSIS — Z923 Personal history of irradiation: Secondary | ICD-10-CM | POA: Diagnosis not present

## 2020-03-10 DIAGNOSIS — Z87891 Personal history of nicotine dependence: Secondary | ICD-10-CM | POA: Insufficient documentation

## 2020-03-10 DIAGNOSIS — R197 Diarrhea, unspecified: Secondary | ICD-10-CM | POA: Insufficient documentation

## 2020-03-10 DIAGNOSIS — E1165 Type 2 diabetes mellitus with hyperglycemia: Secondary | ICD-10-CM | POA: Insufficient documentation

## 2020-03-10 DIAGNOSIS — Z90710 Acquired absence of both cervix and uterus: Secondary | ICD-10-CM | POA: Diagnosis not present

## 2020-03-10 DIAGNOSIS — Z9013 Acquired absence of bilateral breasts and nipples: Secondary | ICD-10-CM | POA: Diagnosis not present

## 2020-03-10 DIAGNOSIS — C50411 Malignant neoplasm of upper-outer quadrant of right female breast: Secondary | ICD-10-CM | POA: Diagnosis not present

## 2020-03-10 DIAGNOSIS — Z006 Encounter for examination for normal comparison and control in clinical research program: Secondary | ICD-10-CM | POA: Diagnosis present

## 2020-03-10 DIAGNOSIS — C50211 Malignant neoplasm of upper-inner quadrant of right female breast: Secondary | ICD-10-CM

## 2020-03-10 DIAGNOSIS — Z79899 Other long term (current) drug therapy: Secondary | ICD-10-CM | POA: Insufficient documentation

## 2020-03-10 LAB — CBC WITH DIFFERENTIAL (CANCER CENTER ONLY)
Abs Immature Granulocytes: 0.05 10*3/uL (ref 0.00–0.07)
Basophils Absolute: 0.1 10*3/uL (ref 0.0–0.1)
Basophils Relative: 1 %
Eosinophils Absolute: 0.2 10*3/uL (ref 0.0–0.5)
Eosinophils Relative: 2 %
HCT: 39.7 % (ref 36.0–46.0)
Hemoglobin: 13 g/dL (ref 12.0–15.0)
Immature Granulocytes: 1 %
Lymphocytes Relative: 25 %
Lymphs Abs: 2.6 10*3/uL (ref 0.7–4.0)
MCH: 30.1 pg (ref 26.0–34.0)
MCHC: 32.7 g/dL (ref 30.0–36.0)
MCV: 91.9 fL (ref 80.0–100.0)
Monocytes Absolute: 1.1 10*3/uL — ABNORMAL HIGH (ref 0.1–1.0)
Monocytes Relative: 10 %
Neutro Abs: 6.5 10*3/uL (ref 1.7–7.7)
Neutrophils Relative %: 61 %
Platelet Count: 206 10*3/uL (ref 150–400)
RBC: 4.32 MIL/uL (ref 3.87–5.11)
RDW: 13.3 % (ref 11.5–15.5)
WBC Count: 10.6 10*3/uL — ABNORMAL HIGH (ref 4.0–10.5)
nRBC: 0 % (ref 0.0–0.2)

## 2020-03-10 LAB — CMP (CANCER CENTER ONLY)
ALT: 30 U/L (ref 0–44)
AST: 18 U/L (ref 15–41)
Albumin: 3.7 g/dL (ref 3.5–5.0)
Alkaline Phosphatase: 107 U/L (ref 38–126)
Anion gap: 9 (ref 5–15)
BUN: 13 mg/dL (ref 6–20)
CO2: 28 mmol/L (ref 22–32)
Calcium: 10.2 mg/dL (ref 8.9–10.3)
Chloride: 102 mmol/L (ref 98–111)
Creatinine: 0.83 mg/dL (ref 0.44–1.00)
GFR, Est AFR Am: >60 mL/min (ref >60)
GFR, Estimated: >60 mL/min (ref >60)
Glucose, Bld: 182 mg/dL — ABNORMAL HIGH (ref 70–99)
Potassium: 3.5 mmol/L (ref 3.5–5.1)
Sodium: 139 mmol/L (ref 135–145)
Total Bilirubin: <0.2 mg/dL — ABNORMAL LOW (ref 0.3–1.2)
Total Protein: 8 g/dL (ref 6.5–8.1)

## 2020-03-10 MED FILL — traZODone HCL 50 MG TABS: 50 | 30 days supply | Qty: 60 | Fill #6

## 2020-03-10 NOTE — Research (Signed)
03/10/2020   AFT-05 PALLAS - Follow-Up Phase I Month 48  Patient into clinic today unaccompanied for routine follow-up visit.  Re-consent- Informed patient of updated consent form for this study and reviewed the changes with patient in detail. Provided patient with copy of ICF version 8, dated 09/23/19.  Informed patient of addition of blood sample collection at 7 and 10 years, Covid19 information collection and guidance for study visits. Also explained the study provided clarification for follow-up visit time points. Patient denied any questions after reviewing the updated ICF and agreed to continued participation in this study. Patient initialed and dated at the bottom of each page, signed and dated for the main study and hippa portions. Patient also agreed to continue to allow study to keep her residuals specimens for future use and to be contacted in the future for more research. Patient did not want to wait for copies of signed/dated forms to be made today as she had another appointment this morning. She agreed for copies to be mailed to her home address and confirmed the address in EMR is correct.    Anti-cancer medications - Patient states she is continuing to take anastrozole without interruption; she is on no new anti-cancer medications.   Serious adverse events - Patient denies any new, serious health problems since her last visit.    Disease monitoring - See MD note.  COVID-19 - She denies any COVID-19 infection and denies any COVID-19 testing since January 2021 which she reported a negative test during her 42 months phone call. MD noted today that patient reported she has been vaccinated for COVID-19 since last visit.  Patient is aware that research nurse will contact her by phone for the Month 54 follow-up visit. Otherwise, she will return to the clinic in one year for annual follow-up visit in conjunction with the [place] study. Patient agreed with this plan. Thanked patient for her  participation in this research study.  Foye Spurling, BSN, RN Clinical Research Nurse 03/10/2020 11:00 AM

## 2020-03-10 NOTE — Research (Signed)
[facility] [id number] [facility] -Month 48 visit  Patient into clinic today unaccompanied for routine follow-up visit.   Re-consent: Patient stated she received new consent form in the mail but did not review it.  Reviewed the changes in consent form with patient in clinic. Discussed the addition of option to participate in a imaging sub-study about body composition. Reviewed this section in detail and patient denied any questions. She agreed to the new addition for optional imaging collection . Patient also agreed to continue the optional blood sample collection, using samples for future research and to being contacted in the future about other studies. Patient did not have any questions about any of the changes and agreed to continue on study. Patient signed and dated the consent form for protocol version date 11/06/2019, [place] active date 12/22/2019.   The study Hippa form was also reviewed with patient and patient was shown the updated list of groups of who can see patient's health information. Informed patient this list was updated to align with new consent form. Patient denied any questions about the new hippa form and signed and dated this form as well.  Patient did not want to wait for copies of the signed forms today as she had another appointment this morning. Patient agreed for forms to be mailed to her home address and confirmed the address in EMR is correct.   Follow-Up Questionnaire: Completed independently by patient upon arrival to clinic.  Disease status/outcome assessment - See MD note.  Weight and waist and hip circumference measurements - obtained per protocol and following the instructions in the BWEL Weight and Height Protocol document.   COVID-19 - Patient denies any COVID-19 infection and has not had any testing for COVID-19 since she was last questioned in February 2021 (tested negative August 25, 2019).   Patient understands that she will continue in the follow-up phase including  ongoing measurements to occur annually in conjunction with her PALLAS study visits.  Thanked patient for her study participation.  Foye Spurling, BSN, RN Clinical Research Nurse 03/10/2020 11:00 AM

## 2020-03-18 ENCOUNTER — Telehealth: Payer: Self-pay

## 2020-03-18 ENCOUNTER — Other Ambulatory Visit: Payer: Medicare Other

## 2020-03-18 ENCOUNTER — Other Ambulatory Visit: Payer: Self-pay | Admitting: Internal Medicine

## 2020-03-18 DIAGNOSIS — E1169 Type 2 diabetes mellitus with other specified complication: Secondary | ICD-10-CM

## 2020-03-18 LAB — HEMOGLOBIN A1C: Hgb A1c MFr Bld: 9.1 % — ABNORMAL HIGH (ref 4.6–6.5)

## 2020-03-18 NOTE — Telephone Encounter (Signed)
Per Dr. Sharlet Salina - pt will need to have A1c lab done today or the virtual visit for Monday will need to be canceled.   Pt has been contacted and informed of same. Pt will call me back in a moment to let me know if she can get a ride in for the lab. Pt did stated that she has another appointment today and her husband has dialysis.   Question: If pt was able to go to the lab early Monday morning and we had them result lab (as it is faster) would you be able to do the virtual visit a little later? (this is assuming you will be available).

## 2020-03-18 NOTE — Telephone Encounter (Signed)
My last available for Monday is 12:40 so maybe if she went to [facility] and it was ordered stat but otherwise I would recommend to move to an in-person with another provider for the visit.

## 2020-03-18 NOTE — Telephone Encounter (Signed)
Thanks

## 2020-03-18 NOTE — Addendum Note (Signed)
Addended by: Jacob Moores on: 03/18/2020 12:25 PM   Modules accepted: Orders

## 2020-03-18 NOTE — Telephone Encounter (Signed)
LVM for pt to call back as soon as possible.  Informed via vm that I will check her chart on Monday and if no A1c result then I will need to cancel her appointment.

## 2020-03-18 NOTE — Telephone Encounter (Signed)
She has done labs so no need to check on Monday. I have result

## 2020-03-21 ENCOUNTER — Telehealth (INDEPENDENT_AMBULATORY_CARE_PROVIDER_SITE_OTHER): Payer: Medicare Other | Admitting: Internal Medicine

## 2020-03-21 ENCOUNTER — Other Ambulatory Visit: Payer: Self-pay | Admitting: Internal Medicine

## 2020-03-21 ENCOUNTER — Encounter: Payer: Self-pay | Admitting: Internal Medicine

## 2020-03-21 DIAGNOSIS — M5416 Radiculopathy, lumbar region: Secondary | ICD-10-CM | POA: Diagnosis not present

## 2020-03-21 DIAGNOSIS — E1169 Type 2 diabetes mellitus with other specified complication: Secondary | ICD-10-CM | POA: Diagnosis not present

## 2020-03-21 MED ORDER — BACLOFEN 20 MG PO TABS
20.0000 mg | ORAL_TABLET | Freq: Three times a day (TID) | ORAL | 3 refills | Status: DC
Start: 2020-03-21 — End: 2020-09-15

## 2020-03-21 MED ORDER — EMPAGLIFLOZIN 25 MG PO TABS
25.0000 mg | ORAL_TABLET | Freq: Every day | ORAL | 3 refills | Status: DC
Start: 2020-03-21 — End: 2020-03-21

## 2020-03-21 MED ORDER — IBUPROFEN 800 MG PO TABS: 800.0000 mg | ORAL_TABLET | Freq: Three times a day (TID) | ORAL | 3 refills | Status: DC | PRN

## 2020-03-21 MED FILL — POTASSIUM CL ER 10 MEQ CAP: 10 | 45 days supply | Qty: 180 | Fill #2

## 2020-03-21 MED FILL — IBUPROFEN 800 MG TAB: 800 | 30 days supply | Qty: 90 | Fill #0

## 2020-03-21 MED FILL — BACLOFEN 20 MG TABS: 20 | 30 days supply | Qty: 90 | Fill #0

## 2020-03-21 MED FILL — PANTOPRAZOLE SOD DR 40 MG T: 40 | 30 days supply | Qty: 30 | Fill #1

## 2020-03-21 MED FILL — LOSARTAN-HCTZ 100-25 MG TAB: 100-25 | 30 days supply | Qty: 30 | Fill #0

## 2020-03-21 MED FILL — JARDIANCE 25 MG TABLET: 25 | 90 days supply | Qty: 90 | Fill #0

## 2020-03-21 NOTE — Progress Notes (Signed)
Virtual Visit via Video Note  I connected with Margaret [name] on 03/21/20 at  9:40 AM EDT by a video enabled telemedicine application and verified that I am speaking with the correct person using two identifiers.  The patient and the provider were at separate locations throughout the entire encounter. Patient location: home, Provider location: work   I discussed the limitations of evaluation and management by telemedicine and the availability of in person appointments. The patient expressed understanding and agreed to proceed. The patient and the provider were the only parties present for the visit unless noted in HPI below.  History of Present Illness: The patient is a 59 y.o. female with visit for follow up sugars. Has been having a lot of GI upset with the higher dose of metformin which is problematic. She is still trying to take it regularly. Has been eating a lot of watermelon this summer but trying to cut back on ice cream and cake. Has been still having the pain in her legs and needs the number for the neurosurgeon as she was on vacation when they called and she lost their number. Would like to try a different muscle relaxer.  Observations/Objective: Appearance: normal, breathing appears normal, casual grooming, abdomen does not appear distended, throat normal, memory nomal, mental status is A and O times 3  Assessment and Plan: See problem oriented charting  Follow Up Instructions: Stop metformin and start jardiance, change flexeril to baclofen, visit in 3 months for physical.   I discussed the assessment and treatment plan with the patient. The patient was provided an opportunity to ask questions and all were answered. The patient agreed with the plan and demonstrated an understanding of the instructions.   The patient was advised to call back or seek an in-person evaluation if the symptoms worsen or if the condition fails to improve as anticipated.  Hoyt Koch, MD

## 2020-03-21 NOTE — Assessment & Plan Note (Signed)
Will call her with number for [place] [facility] to schedule to try epidural for back. Rx baclofen and can refill tramadol when due until follow up.

## 2020-03-21 NOTE — Assessment & Plan Note (Signed)
HgA1c done Friday which was 9.1. Will stop metformin due to GI intolerance and start jardiance instead. Visit in 3 months. Offered nutrition referral which she does not feel necessary but will make some changes to diet in the meantime.

## 2020-03-28 ENCOUNTER — Other Ambulatory Visit: Payer: Self-pay | Admitting: Internal Medicine

## 2020-03-28 DIAGNOSIS — C50211 Malignant neoplasm of upper-inner quadrant of right female breast: Secondary | ICD-10-CM

## 2020-03-28 DIAGNOSIS — Z170 Estrogen receptor positive status [ER+]: Secondary | ICD-10-CM

## 2020-03-28 DIAGNOSIS — C50412 Malignant neoplasm of upper-outer quadrant of left female breast: Secondary | ICD-10-CM

## 2020-04-04 ENCOUNTER — Other Ambulatory Visit: Payer: Self-pay | Admitting: Oncology

## 2020-04-04 DIAGNOSIS — C50011 Malignant neoplasm of nipple and areola, right female breast: Secondary | ICD-10-CM

## 2020-04-04 DIAGNOSIS — G629 Polyneuropathy, unspecified: Secondary | ICD-10-CM

## 2020-04-04 MED FILL — GABAPENTIN 300 MG CAPSULE: 300 | 30 days supply | Qty: 180 | Fill #0

## 2020-04-04 MED FILL — traMADol HCL 50 MG TABS: 50 | 30 days supply | Qty: 30 | Fill #0

## 2020-04-08 ENCOUNTER — Telehealth: Payer: Self-pay | Admitting: *Deleted

## 2020-04-08 NOTE — Telephone Encounter (Signed)
Connected with patient.  Unable to obtain Gabapentin authorization.  Account not found.  Patient connected with [employer].  Reports Gabapentin has been filled, ready to ship, no prior authorization required.

## 2020-04-25 ENCOUNTER — Other Ambulatory Visit: Payer: Self-pay | Admitting: Oncology

## 2020-04-25 DIAGNOSIS — G4720 Circadian rhythm sleep disorder, unspecified type: Secondary | ICD-10-CM

## 2020-04-25 MED FILL — IBUPROFEN 800 MG TAB: 800 | 30 days supply | Qty: 90 | Fill #1

## 2020-04-25 MED FILL — PANTOPRAZOLE SOD DR 40 MG T: 40 | 30 days supply | Qty: 30 | Fill #2

## 2020-04-26 ENCOUNTER — Other Ambulatory Visit: Payer: Self-pay | Admitting: Oncology

## 2020-04-26 MED FILL — traZODone HCL 50 MG TABS: 50 | 30 days supply | Qty: 60 | Fill #0

## 2020-04-26 MED FILL — LOSARTAN-HCTZ 100-25 MG TAB: 100-25 | 30 days supply | Qty: 30 | Fill #0

## 2020-05-09 ENCOUNTER — Other Ambulatory Visit: Payer: Self-pay | Admitting: Oncology

## 2020-05-09 ENCOUNTER — Other Ambulatory Visit: Payer: Self-pay | Admitting: Internal Medicine

## 2020-05-09 DIAGNOSIS — C50211 Malignant neoplasm of upper-inner quadrant of right female breast: Secondary | ICD-10-CM

## 2020-05-09 DIAGNOSIS — C50412 Malignant neoplasm of upper-outer quadrant of left female breast: Secondary | ICD-10-CM

## 2020-05-09 MED FILL — traMADol HCL 50 MG TABS: 50 | 30 days supply | Qty: 30 | Fill #1

## 2020-05-09 MED FILL — BACLOFEN 20 MG TABS: 20 | 30 days supply | Qty: 90 | Fill #1

## 2020-05-09 MED FILL — valACYclovir HCL 1 GM TABS: 1 | 45 days supply | Qty: 90 | Fill #1

## 2020-05-09 MED FILL — VENLAFAXINE HCL ER 150 MG C: 150 | 90 days supply | Qty: 90 | Fill #0

## 2020-05-11 ENCOUNTER — Other Ambulatory Visit: Payer: Self-pay

## 2020-05-11 DIAGNOSIS — C50211 Malignant neoplasm of upper-inner quadrant of right female breast: Secondary | ICD-10-CM

## 2020-05-11 DIAGNOSIS — C50412 Malignant neoplasm of upper-outer quadrant of left female breast: Secondary | ICD-10-CM

## 2020-05-11 MED ORDER — VENLAFAXINE HCL ER 150 MG PO CP24: ORAL_CAPSULE | ORAL | 0 refills | Status: DC

## 2020-05-13 ENCOUNTER — Telehealth: Payer: Self-pay | Admitting: Oncology

## 2020-05-13 MED FILL — GABAPENTIN 300 MG CAPSULE: 300 | 30 days supply | Qty: 180 | Fill #1

## 2020-05-13 MED FILL — AMLODIPINE BESYLATE 5 MG TA: 5 | 90 days supply | Qty: 90 | Fill #3

## 2020-05-13 MED FILL — POTASSIUM CL ER 10 MEQ CAP: 10 | 45 days supply | Qty: 180 | Fill #3

## 2020-05-13 NOTE — Telephone Encounter (Signed)
Rescheduled appointments per 10/8 scheduling message. Called patient, left message for patient with appointments date and times.

## 2020-05-26 MED FILL — IBUPROFEN 800 MG TAB: 800 | 30 days supply | Qty: 90 | Fill #2

## 2020-05-26 MED FILL — LOSARTAN-HCTZ 100-25 MG TAB: 100-25 | 30 days supply | Qty: 30 | Fill #1

## 2020-06-07 ENCOUNTER — Other Ambulatory Visit: Payer: Self-pay | Admitting: Oncology

## 2020-06-07 DIAGNOSIS — C50412 Malignant neoplasm of upper-outer quadrant of left female breast: Secondary | ICD-10-CM

## 2020-06-07 DIAGNOSIS — Z170 Estrogen receptor positive status [ER+]: Secondary | ICD-10-CM

## 2020-06-07 MED FILL — PANTOPRAZOLE SOD DR 40 MG T: 40 | 30 days supply | Qty: 30 | Fill #0

## 2020-06-07 MED FILL — traMADol HCL 50 MG TABS: 50 | 30 days supply | Qty: 30 | Fill #2

## 2020-06-08 ENCOUNTER — Other Ambulatory Visit: Payer: Self-pay | Admitting: *Deleted

## 2020-06-13 ENCOUNTER — Other Ambulatory Visit: Payer: Self-pay | Admitting: Oncology

## 2020-06-13 DIAGNOSIS — C50012 Malignant neoplasm of nipple and areola, left female breast: Secondary | ICD-10-CM

## 2020-06-13 DIAGNOSIS — C50011 Malignant neoplasm of nipple and areola, right female breast: Secondary | ICD-10-CM

## 2020-06-13 DIAGNOSIS — G629 Polyneuropathy, unspecified: Secondary | ICD-10-CM

## 2020-06-13 MED FILL — BACLOFEN 20 MG TABS: 20 | 30 days supply | Qty: 90 | Fill #2

## 2020-06-13 MED FILL — traZODone HCL 50 MG TABS: 50 | 30 days supply | Qty: 60 | Fill #1

## 2020-06-13 MED FILL — GABAPENTIN 300 MG CAPSULE: 300 | 30 days supply | Qty: 180 | Fill #0

## 2020-06-13 MED FILL — ANASTROZOLE 1 MG TABLET: 1 | 90 days supply | Qty: 90 | Fill #0

## 2020-06-15 ENCOUNTER — Other Ambulatory Visit: Payer: Self-pay | Admitting: Oncology

## 2020-06-15 ENCOUNTER — Other Ambulatory Visit: Payer: Self-pay

## 2020-06-15 MED ORDER — ANASTROZOLE 1 MG PO TABS
1.0000 mg | ORAL_TABLET | Freq: Every day | ORAL | 0 refills | Status: DC
Start: 2020-06-15 — End: 2020-06-15

## 2020-06-23 MED FILL — JARDIANCE 25 MG TABLET: 25 | 90 days supply | Qty: 90 | Fill #1

## 2020-06-23 MED FILL — LOSARTAN-HCTZ 100-25 MG TAB: 100-25 | 30 days supply | Qty: 30 | Fill #2

## 2020-06-27 MED FILL — IBUPROFEN 800 MG TAB: 800 | 30 days supply | Qty: 90 | Fill #3

## 2020-07-05 ENCOUNTER — Encounter: Payer: Medicare Other | Admitting: Internal Medicine

## 2020-07-05 DIAGNOSIS — M47816 Spondylosis without myelopathy or radiculopathy, lumbar region: Secondary | ICD-10-CM | POA: Diagnosis not present

## 2020-07-05 DIAGNOSIS — I10 Essential (primary) hypertension: Secondary | ICD-10-CM | POA: Diagnosis not present

## 2020-07-11 ENCOUNTER — Other Ambulatory Visit: Payer: Self-pay | Admitting: Internal Medicine

## 2020-07-11 DIAGNOSIS — Z170 Estrogen receptor positive status [ER+]: Secondary | ICD-10-CM

## 2020-07-11 DIAGNOSIS — C50412 Malignant neoplasm of upper-outer quadrant of left female breast: Secondary | ICD-10-CM

## 2020-07-11 DIAGNOSIS — C50211 Malignant neoplasm of upper-inner quadrant of right female breast: Secondary | ICD-10-CM

## 2020-07-11 MED FILL — valACYclovir HCL 1 GM TABS: 1 | 45 days supply | Qty: 90 | Fill #2

## 2020-07-14 DIAGNOSIS — M47816 Spondylosis without myelopathy or radiculopathy, lumbar region: Secondary | ICD-10-CM | POA: Diagnosis not present

## 2020-07-22 ENCOUNTER — Other Ambulatory Visit: Payer: Self-pay | Admitting: Oncology

## 2020-07-22 DIAGNOSIS — C50012 Malignant neoplasm of nipple and areola, left female breast: Secondary | ICD-10-CM

## 2020-07-22 DIAGNOSIS — C50011 Malignant neoplasm of nipple and areola, right female breast: Secondary | ICD-10-CM

## 2020-07-22 DIAGNOSIS — G629 Polyneuropathy, unspecified: Secondary | ICD-10-CM

## 2020-07-22 MED FILL — BACLOFEN 20 MG TABS: 20 | 30 days supply | Qty: 90 | Fill #3

## 2020-07-22 MED FILL — GABAPENTIN 300 MG CAPSULE: 300 | 30 days supply | Qty: 180 | Fill #0

## 2020-07-22 MED FILL — LOSARTAN-HCTZ 100-25 MG TAB: 100-25 | 30 days supply | Qty: 30 | Fill #3

## 2020-07-22 MED FILL — POTASSIUM CL ER 10 MEQ CAP: 10 | 45 days supply | Qty: 180 | Fill #4

## 2020-07-22 MED FILL — PANTOPRAZOLE SOD DR 40 MG T: 40 | 30 days supply | Qty: 30 | Fill #1

## 2020-07-25 ENCOUNTER — Other Ambulatory Visit: Payer: Self-pay | Admitting: Internal Medicine

## 2020-07-25 MED FILL — IBUPROFEN 800 MG TAB: 800 | 30 days supply | Qty: 90 | Fill #0

## 2020-07-28 ENCOUNTER — Other Ambulatory Visit: Payer: Self-pay | Admitting: *Deleted

## 2020-07-28 ENCOUNTER — Other Ambulatory Visit: Payer: Self-pay | Admitting: Oncology

## 2020-07-28 DIAGNOSIS — Z170 Estrogen receptor positive status [ER+]: Secondary | ICD-10-CM

## 2020-07-28 DIAGNOSIS — G629 Polyneuropathy, unspecified: Secondary | ICD-10-CM

## 2020-07-28 DIAGNOSIS — C50011 Malignant neoplasm of nipple and areola, right female breast: Secondary | ICD-10-CM

## 2020-07-28 MED ORDER — GABAPENTIN 300 MG PO CAPS: 600.0000 mg | ORAL_CAPSULE | Freq: Three times a day (TID) | ORAL | 2 refills | Status: DC

## 2020-08-01 MED FILL — VENLAFAXINE HCL ER 150 MG C: 150 | 90 days supply | Qty: 90 | Fill #1

## 2020-08-03 DIAGNOSIS — C50912 Malignant neoplasm of unspecified site of left female breast: Secondary | ICD-10-CM | POA: Diagnosis not present

## 2020-08-15 ENCOUNTER — Other Ambulatory Visit: Payer: Self-pay | Admitting: Oncology

## 2020-08-15 MED FILL — AMLODIPINE BESYLATE 5 MG TA: 5 | 90 days supply | Qty: 90 | Fill #0

## 2020-08-15 MED FILL — traZODone HCL 50 MG TABS: 50 | 30 days supply | Qty: 60 | Fill #2

## 2020-08-16 ENCOUNTER — Other Ambulatory Visit: Payer: Self-pay | Admitting: Internal Medicine

## 2020-08-16 ENCOUNTER — Other Ambulatory Visit: Payer: Self-pay

## 2020-08-16 ENCOUNTER — Ambulatory Visit (INDEPENDENT_AMBULATORY_CARE_PROVIDER_SITE_OTHER): Payer: Medicare Other | Admitting: Internal Medicine

## 2020-08-16 ENCOUNTER — Encounter: Payer: Self-pay | Admitting: Internal Medicine

## 2020-08-16 VITALS — BP 130/80 | HR 80 | Temp 98.3°F | Ht 69.0 in | Wt 269.0 lb

## 2020-08-16 DIAGNOSIS — C50412 Malignant neoplasm of upper-outer quadrant of left female breast: Secondary | ICD-10-CM | POA: Diagnosis not present

## 2020-08-16 DIAGNOSIS — M5416 Radiculopathy, lumbar region: Secondary | ICD-10-CM | POA: Diagnosis not present

## 2020-08-16 DIAGNOSIS — Z23 Encounter for immunization: Secondary | ICD-10-CM

## 2020-08-16 DIAGNOSIS — Z0000 Encounter for general adult medical examination without abnormal findings: Secondary | ICD-10-CM | POA: Diagnosis not present

## 2020-08-16 DIAGNOSIS — Z170 Estrogen receptor positive status [ER+]: Secondary | ICD-10-CM | POA: Diagnosis not present

## 2020-08-16 DIAGNOSIS — E1169 Type 2 diabetes mellitus with other specified complication: Secondary | ICD-10-CM

## 2020-08-16 DIAGNOSIS — C50211 Malignant neoplasm of upper-inner quadrant of right female breast: Secondary | ICD-10-CM | POA: Diagnosis not present

## 2020-08-16 LAB — MICROALBUMIN / CREATININE URINE RATIO
Creatinine,U: 74.3 mg/dL
Microalb Creat Ratio: 3.1 mg/g (ref 0.0–30.0)
Microalb, Ur: 2.3 mg/dL — ABNORMAL HIGH (ref 0.0–1.9)

## 2020-08-16 LAB — COMPREHENSIVE METABOLIC PANEL
ALT: 26 U/L (ref 0–35)
AST: 20 U/L (ref 0–37)
Albumin: 4.2 g/dL (ref 3.5–5.2)
Alkaline Phosphatase: 93 U/L (ref 39–117)
BUN: 21 mg/dL (ref 6–23)
CO2: 30 mEq/L (ref 19–32)
Calcium: 9.8 mg/dL (ref 8.4–10.5)
Chloride: 103 mEq/L (ref 96–112)
Creatinine, Ser: 0.77 mg/dL (ref 0.40–1.20)
GFR: 84.52 mL/min (ref >60.00)
Glucose, Bld: 117 mg/dL — ABNORMAL HIGH (ref 70–99)
Potassium: 3.2 mEq/L — ABNORMAL LOW (ref 3.5–5.1)
Sodium: 140 mEq/L (ref 135–145)
Total Bilirubin: 0.3 mg/dL (ref 0.2–1.2)
Total Protein: 7.9 g/dL (ref 6.0–8.3)

## 2020-08-16 LAB — CBC
HCT: 41.4 % (ref 36.0–46.0)
Hemoglobin: 13.9 g/dL (ref 12.0–15.0)
MCHC: 33.5 g/dL (ref 30.0–36.0)
MCV: 91.1 fl (ref 78.0–100.0)
Platelets: 214 10*3/uL (ref 150.0–400.0)
RBC: 4.54 Mil/uL (ref 3.87–5.11)
RDW: 15.4 % (ref 11.5–15.5)
WBC: 10.6 10*3/uL — ABNORMAL HIGH (ref 4.0–10.5)

## 2020-08-16 LAB — LIPID PANEL
Cholesterol: 166 mg/dL (ref 0–200)
HDL: 41.3 mg/dL (ref >39.00)
LDL Cholesterol: 99 mg/dL (ref 0–99)
NonHDL: 125.08
Total CHOL/HDL Ratio: 4
Triglycerides: 129 mg/dL (ref 0.0–149.0)
VLDL: 25.8 mg/dL (ref 0.0–40.0)

## 2020-08-16 LAB — HEMOGLOBIN A1C: Hgb A1c MFr Bld: 8.4 % — ABNORMAL HIGH (ref 4.6–6.5)

## 2020-08-16 MED ORDER — TRAMADOL HCL 50 MG PO TABS: 50.0000 mg | ORAL_TABLET | Freq: Every day | ORAL | 2 refills | Status: DC | PRN

## 2020-08-16 MED FILL — traMADol HCL 50 MG TABS: 50 | 30 days supply | Qty: 30 | Fill #0

## 2020-08-16 NOTE — Progress Notes (Signed)
   Subjective:   Patient ID: Margaret [name], female    DOB: 1961-04-18, 60 y.o.   MRN: [id number]  HPI The patient is a 60 YO female coming in for physical.   PMH, Industry, social history reviewed and updated  Review of Systems  Constitutional: Negative.   HENT: Negative.   Eyes: Negative.   Respiratory: Negative for cough, chest tightness and shortness of breath.   Cardiovascular: Positive for chest pain. Negative for palpitations and leg swelling.       Stable, chronic  Gastrointestinal: Negative for abdominal distention, abdominal pain, constipation, diarrhea, nausea and vomiting.  Musculoskeletal: Negative.   Skin: Negative.   Neurological: Negative.   Psychiatric/Behavioral: Negative.     Objective:  Physical Exam Constitutional:      Appearance: She is well-developed and well-nourished.  HENT:     Head: Normocephalic and atraumatic.  Eyes:     Extraocular Movements: EOM normal.  Cardiovascular:     Rate and Rhythm: Normal rate and regular rhythm.  Pulmonary:     Effort: Pulmonary effort is normal. No respiratory distress.     Breath sounds: Normal breath sounds. No wheezing or rales.  Abdominal:     General: Bowel sounds are normal. There is no distension.     Palpations: Abdomen is soft.     Tenderness: There is no abdominal tenderness. There is no rebound.  Musculoskeletal:        General: No edema.     Cervical back: Normal range of motion.  Skin:    General: Skin is warm and dry.     Comments: Foot exam done  Neurological:     Mental Status: She is alert and oriented to person, place, and time.     Coordination: Coordination normal.  Psychiatric:        Mood and Affect: Mood and affect normal.     Vitals:   08/16/20 1014  BP: 130/80  Pulse: 80  Temp: 98.3 F (36.8 C)  TempSrc: Oral  SpO2: 97%  Weight: 269 lb (122 kg)  Height: 5\' 9"  (1.753 m)    This visit occurred during the SARS-CoV-2 public health emergency.  Safety protocols were in place,  including screening questions prior to the visit, additional usage of staff PPE, and extensive cleaning of exam room while observing appropriate contact time as indicated for disinfecting solutions.   Assessment & Plan:  Flu shot given at visit

## 2020-08-16 NOTE — Patient Instructions (Signed)
Health Maintenance, Female Adopting a healthy lifestyle and getting preventive care are important in promoting health and wellness. Ask your health care provider about:  The right schedule for you to have regular tests and exams.  Things you can do on your own to prevent diseases and keep yourself healthy. What should I know about diet, weight, and exercise? Eat a healthy diet  Eat a diet that includes plenty of vegetables, fruits, low-fat dairy products, and lean protein.  Do not eat a lot of foods that are high in solid fats, added sugars, or sodium.   Maintain a healthy weight Body mass index (BMI) is used to identify weight problems. It estimates body fat based on height and weight. Your health care provider can help determine your BMI and help you achieve or maintain a healthy weight. Get regular exercise Get regular exercise. This is one of the most important things you can do for your health. Most adults should:  Exercise for at least 150 minutes each week. The exercise should increase your heart rate and make you sweat (moderate-intensity exercise).  Do strengthening exercises at least twice a week. This is in addition to the moderate-intensity exercise.  Spend less time sitting. Even light physical activity can be beneficial. Watch cholesterol and blood lipids Have your blood tested for lipids and cholesterol at 60 years of age, then have this test every 5 years. Have your cholesterol levels checked more often if:  Your lipid or cholesterol levels are high.  You are older than 60 years of age.  You are at high risk for heart disease. What should I know about cancer screening? Depending on your health history and family history, you may need to have cancer screening at various ages. This may include screening for:  Breast cancer.  Cervical cancer.  Colorectal cancer.  Skin cancer.  Lung cancer. What should I know about heart disease, diabetes, and high blood  pressure? Blood pressure and heart disease  High blood pressure causes heart disease and increases the risk of stroke. This is more likely to develop in people who have high blood pressure readings, are of African descent, or are overweight.  Have your blood pressure checked: ? Every 3-5 years if you are 18-39 years of age. ? Every year if you are 40 years old or older. Diabetes Have regular diabetes screenings. This checks your fasting blood sugar level. Have the screening done:  Once every three years after age 40 if you are at a normal weight and have a low risk for diabetes.  More often and at a younger age if you are overweight or have a high risk for diabetes. What should I know about preventing infection? Hepatitis B If you have a higher risk for hepatitis B, you should be screened for this virus. Talk with your health care provider to find out if you are at risk for hepatitis B infection. Hepatitis C Testing is recommended for:  Everyone born from 1945 through 1965.  Anyone with known risk factors for hepatitis C. Sexually transmitted infections (STIs)  Get screened for STIs, including gonorrhea and chlamydia, if: ? You are sexually active and are younger than 60 years of age. ? You are older than 60 years of age and your health care provider tells you that you are at risk for this type of infection. ? Your sexual activity has changed since you were last screened, and you are at increased risk for chlamydia or gonorrhea. Ask your health care provider   if you are at risk.  Ask your health care provider about whether you are at high risk for HIV. Your health care provider may recommend a prescription medicine to help prevent HIV infection. If you choose to take medicine to prevent HIV, you should first get tested for HIV. You should then be tested every 3 months for as long as you are taking the medicine. Pregnancy  If you are about to stop having your period (premenopausal) and  you may become pregnant, seek counseling before you get pregnant.  Take 400 to 800 micrograms (mcg) of folic acid every day if you become pregnant.  Ask for birth control (contraception) if you want to prevent pregnancy. Osteoporosis and menopause Osteoporosis is a disease in which the bones lose minerals and strength with aging. This can result in bone fractures. If you are 65 years old or older, or if you are at risk for osteoporosis and fractures, ask your health care provider if you should:  Be screened for bone loss.  Take a calcium or vitamin D supplement to lower your risk of fractures.  Be given hormone replacement therapy (HRT) to treat symptoms of menopause. Follow these instructions at home: Lifestyle  Do not use any products that contain nicotine or tobacco, such as cigarettes, e-cigarettes, and chewing tobacco. If you need help quitting, ask your health care provider.  Do not use street drugs.  Do not share needles.  Ask your health care provider for help if you need support or information about quitting drugs. Alcohol use  Do not drink alcohol if: ? Your health care provider tells you not to drink. ? You are pregnant, may be pregnant, or are planning to become pregnant.  If you drink alcohol: ? Limit how much you use to 0-1 drink a day. ? Limit intake if you are breastfeeding.  Be aware of how much alcohol is in your drink. In the [place] one drink equals one 12 oz bottle of beer (355 mL), one 5 oz glass of wine (148 mL), or one 1 oz glass of hard liquor (44 mL). General instructions  Schedule regular health, dental, and eye exams.  Stay current with your vaccines.  Tell your health care provider if: ? You often feel depressed. ? You have ever been abused or do not feel safe at home. Summary  Adopting a healthy lifestyle and getting preventive care are important in promoting health and wellness.  Follow your health care provider's instructions about healthy  diet, exercising, and getting tested or screened for diseases.  Follow your health care provider's instructions on monitoring your cholesterol and blood pressure. This information is not intended to replace advice given to you by your health care provider. Make sure you discuss any questions you have with your health care provider. Document Revised: 07/16/2018 Document Reviewed: 07/16/2018 Elsevier Patient Education  2021 [employer] Inc[employer]  

## 2020-08-17 ENCOUNTER — Other Ambulatory Visit: Payer: Self-pay | Admitting: *Deleted

## 2020-08-17 ENCOUNTER — Other Ambulatory Visit: Payer: Self-pay | Admitting: Oncology

## 2020-08-17 MED ORDER — AMLODIPINE BESYLATE 5 MG PO TABS: 5.0000 mg | ORAL_TABLET | Freq: Every day | ORAL | 6 refills | Status: DC

## 2020-08-19 ENCOUNTER — Other Ambulatory Visit: Payer: Self-pay | Admitting: Oncology

## 2020-08-19 MED FILL — LOSARTAN-HCTZ 100-25 MG TAB: 100-25 | 30 days supply | Qty: 30 | Fill #0

## 2020-08-19 MED FILL — valACYclovir HCL 1 GM TABS: 1 | 45 days supply | Qty: 90 | Fill #3

## 2020-08-19 NOTE — Assessment & Plan Note (Signed)
BMI 39, complicated by OA, DM, HTN.

## 2020-08-19 NOTE — Assessment & Plan Note (Signed)
On arimidex 

## 2020-08-19 NOTE — Assessment & Plan Note (Signed)
Taking gabapentin and refill tramadol.

## 2020-08-19 NOTE — Assessment & Plan Note (Signed)
Foot exam done, checking HgA1c and microalbumin to creatinine ratio. Taking jardiance and adjust as needed.

## 2020-08-19 NOTE — Assessment & Plan Note (Signed)
Flu shot given at visit. Covid-19 up to date 4th shot due June 2022. Pneumonia due declines. Shingrix due declines. Tetanus up to date. Colonoscopy up to date. Mammogram up to date, pap smear up to date. Counseled about sun safety and mole surveillance. Counseled about the dangers of distracted driving. Given 10 year screening recommendations.

## 2020-08-22 MED FILL — IBUPROFEN 800 MG TAB: 800 | 30 days supply | Qty: 90 | Fill #1

## 2020-08-22 MED FILL — GABAPENTIN 300 MG CAPSULE: 300 | 30 days supply | Qty: 180 | Fill #0

## 2020-08-29 ENCOUNTER — Telehealth: Payer: Self-pay | Admitting: Internal Medicine

## 2020-08-29 ENCOUNTER — Other Ambulatory Visit: Payer: Self-pay | Admitting: Internal Medicine

## 2020-08-29 MED ORDER — ATORVASTATIN CALCIUM 20 MG PO TABS: 20.0000 mg | ORAL_TABLET | Freq: Every day | ORAL | 3 refills | Status: DC

## 2020-08-29 MED ORDER — GLIMEPIRIDE 2 MG PO TABS: 2.0000 mg | ORAL_TABLET | Freq: Every day | ORAL | 1 refills | Status: DC

## 2020-08-29 MED FILL — ATORVASTATIN CALCIUM 20 MG: 20 | 90 days supply | Qty: 90 | Fill #0

## 2020-08-29 MED FILL — GLIMEPIRIDE 2 MG TABS: 2 | 90 days supply | Qty: 90 | Fill #0

## 2020-08-29 NOTE — Telephone Encounter (Signed)
Patient wondering if she needs to stop the empagliflozin (JARDIANCE) 25 MG TABS tablet since she is starting the glimepiride (AMARYL) 2 MG tablet

## 2020-08-29 NOTE — Telephone Encounter (Signed)
See below

## 2020-08-29 NOTE — Telephone Encounter (Signed)
No should continue both

## 2020-08-29 NOTE — Telephone Encounter (Signed)
Spoke with the patient and she verbalized understanding her instructions given about her medications. No other concerns at this time.

## 2020-09-07 ENCOUNTER — Telehealth: Payer: Self-pay | Admitting: *Deleted

## 2020-09-07 NOTE — Telephone Encounter (Signed)
AFT-05 [facility] - Follow-Up Phase I Month 32 Phone Call:  Spoke with patient on the phone for 5 minutes to collect the following information for this study time point;   Anti-cancer medications - Patient states she is continuing to take anastrozole without interruption; she is on no new anti-cancer medications.  Serious adverse events - Patient denies any new, serious health problems since her last visit.   Disease monitoring - None since last visit.  COVID-19 - She denies any COVID-19 infection and denies any COVID-19 testing since last visit. She has been fully vaccinated.   Patient is aware of appointment in August to complete next study visit for both the [facility] and BW[facility] studies. Thanked patient for her participation in this research study and encouraged her to call if any questions or concerns prior to next visit. She verbalized understanding.  Foye Spurling, BSN, RN Clinical Research Nurse 09/07/2020 10:15 AM .

## 2020-09-09 ENCOUNTER — Telehealth: Payer: Self-pay | Admitting: Internal Medicine

## 2020-09-09 NOTE — Progress Notes (Signed)
  Chronic Care Management   Outreach Note  09/09/2020 Name: Margaret [name] MRN: [id number] DOB: [birth year]  Referred by: Hoyt Koch, MD Reason for referral : No chief complaint on file.   An unsuccessful telephone outreach was attempted today. The patient was referred to the pharmacist for assistance with care management and care coordination.   Follow Up Plan:   Carley Perdue [employer] Scheduler

## 2020-09-12 ENCOUNTER — Other Ambulatory Visit: Payer: Self-pay | Admitting: Internal Medicine

## 2020-09-12 MED FILL — PANTOPRAZOLE SOD DR 40 MG T: 40 | 30 days supply | Qty: 30 | Fill #2

## 2020-09-12 MED FILL — ANASTROZOLE 1 MG TABLET: 1 | 90 days supply | Qty: 90 | Fill #0

## 2020-09-12 MED FILL — traZODone HCL 50 MG TABS: 50 | 30 days supply | Qty: 60 | Fill #3

## 2020-09-13 ENCOUNTER — Telehealth: Payer: Self-pay | Admitting: Internal Medicine

## 2020-09-13 NOTE — Progress Notes (Signed)
  Chronic Care Management   Note  09/13/2020 Name: Margaret [name] MRN: [id number] DOB: 1960/08/13  Margaret [name] is a 60 y.o. year old female who is a primary care patient of Hoyt Koch, MD. I reached out to Margaret [name] by phone today in response to a referral sent by Ms. Adela Lank Loughmiller's PCP, Hoyt Koch, MD.   Ms. Favaro was given information about Chronic Care Management services today including:  1. CCM service includes personalized support from designated clinical staff supervised by her physician, including individualized plan of care and coordination with other care providers 2. 24/7 contact phone numbers for assistance for urgent and routine care needs. 3. Service will only be billed when office clinical staff spend 20 minutes or more in a month to coordinate care. 4. Only one practitioner may furnish and bill the service in a calendar month. 5. The patient may stop CCM services at any time (effective at the end of the month) by phone call to the office staff.   Patient agreed to services and verbal consent obtained.   Follow up plan:   Carley Perdue [employer] Scheduler

## 2020-09-14 MED FILL — GABAPENTIN 300 MG CAPSULE: 300 | 30 days supply | Qty: 180 | Fill #1

## 2020-09-15 ENCOUNTER — Other Ambulatory Visit: Payer: Self-pay | Admitting: Oncology

## 2020-09-15 MED ORDER — OLMESARTAN-AMLODIPINE-HCTZ 20-5-12.5 MG PO TABS: 1.0000 | ORAL_TABLET | ORAL | 4 refills | Status: DC

## 2020-09-15 MED FILL — BACLOFEN 20 MG TABS: 20 | 30 days supply | Qty: 90 | Fill #0

## 2020-09-16 MED FILL — OLMSRTN-AMLDPN-HCTZ 20-5-12: 20-5-12.5 | 90 days supply | Qty: 90 | Fill #0

## 2020-09-19 MED FILL — traMADol HCL 50 MG TABS: 50 | 30 days supply | Qty: 30 | Fill #1

## 2020-09-19 MED FILL — POTASSIUM CL ER 10 MEQ CAP: 10 | 45 days supply | Qty: 180 | Fill #0

## 2020-09-23 MED FILL — JARDIANCE 25 MG TABLET: 25 | 90 days supply | Qty: 90 | Fill #2

## 2020-09-26 MED FILL — IBUPROFEN 800 MG TAB: 800 | 30 days supply | Qty: 90 | Fill #2

## 2020-10-18 ENCOUNTER — Other Ambulatory Visit: Payer: Self-pay | Admitting: Oncology

## 2020-10-18 DIAGNOSIS — C50412 Malignant neoplasm of upper-outer quadrant of left female breast: Secondary | ICD-10-CM

## 2020-10-24 MED FILL — IBUPROFEN 800 MG TAB: 800 | 30 days supply | Qty: 90 | Fill #3

## 2020-10-24 MED FILL — BACLOFEN 20 MG TABS: 20 | 30 days supply | Qty: 90 | Fill #1

## 2020-10-27 ENCOUNTER — Other Ambulatory Visit (HOSPITAL_BASED_OUTPATIENT_CLINIC_OR_DEPARTMENT_OTHER): Payer: Self-pay

## 2020-10-31 MED FILL — VENLAFAXINE HCL ER 150 MG C: 150 | 90 days supply | Qty: 90 | Fill #2

## 2020-11-01 ENCOUNTER — Telehealth: Payer: Self-pay | Admitting: Pharmacist

## 2020-11-02 ENCOUNTER — Telehealth: Payer: Medicare Other

## 2020-11-02 NOTE — Progress Notes (Deleted)
Chronic Care Management Pharmacy Note  11/02/2020 Name:  Margaret [name] MRN:  [id number] DOB:  [birth year]  Subjective: Margaret [name] is an 60 y.o. year old female who is a primary patient of Hoyt Koch, MD.  The CCM team was consulted for assistance with disease management and care coordination needs.    {CCMTELEPHONEFACETOFACE:21091510} for {CCMINITIALFOLLOWUPCHOICE:21091511} in response to provider referral for pharmacy case management and/or care coordination services.   Consent to Services:  {CCMCONSENTOPTIONS:25074}  Patient Care Team: Hoyt Koch, MD as PCP - General (Internal Medicine) Jovita Kussmaul, MD as Consulting Physician (General Surgery) Magrinat, Virgie Dad, MD as Consulting Physician (Oncology) Gery Pray, MD as Consulting Physician (Radiation Oncology) Mauro Kaufmann, RN as Registered Nurse Rockwell Germany, RN as Registered Nurse Rosemarie Ax, MD as Consulting Physician (Family Medicine) Charlton Haws, [facility] as Pharmacist (Pharmacist)  Recent office visits: 08/16/20 Dr Sharlet Salina OV: CPE. A1c 8.4 (goal 7.5) and LDL elevated. Added glimepiride and atorvastatin.  03/21/20 Dr Sharlet Salina VV: f/u DM. GI upset with metformin. Stopped metformin and started Jardiance. Referred to neurosuergery for lumbar radiculopathy. Rx'd baclofen and refill tramadol.  Recent consult visits: 07/14/20 Dr Davy Pique (pain mgmt): steroid injection 03/10/20 Dr Jana Hakim (oncology): f/u breast cancer (dx 2016). Significant issues with hot flashes even with venlafaxine. Anastrozole started 02/2016, In [facility] trial on palbociclib, completed 2 years.  Hospital visits: {Hospital DC Yes/No:25215}  Objective:  Lab Results  Component Value Date   CREATININE 0.77 08/16/2020   BUN 21 08/16/2020   GFR 84.52 08/16/2020   GFRNONAA >60 03/10/2020   GFRAA >60 03/10/2020   NA 140 08/16/2020   K 3.2 (L) 08/16/2020   CALCIUM 9.8 08/16/2020   CO2 30 08/16/2020   GLUCOSE  117 (H) 08/16/2020    Lab Results  Component Value Date/Time   HGBA1C 8.4 (H) 08/16/2020 11:03 AM   HGBA1C 9.1 (H) 03/18/2020 12:25 PM   HGBA1C 6.5 (H) 07/24/2017 02:07 PM   HGBA1C 8.5 (H) 01/31/2017 10:34 AM   GFR 84.52 08/16/2020 11:03 AM   GFR 87.70 12/01/2019 10:41 AM   MICROALBUR 2.3 (H) 08/16/2020 11:03 AM   MICROALBUR 1.8 12/01/2019 10:41 AM    Last diabetic Eye exam:  Lab Results  Component Value Date/Time   HMDIABEYEEXA No Retinopathy 02/02/2020 12:00 AM    Last diabetic Foot exam: No results found for: HMDIABFOOTEX   Lab Results  Component Value Date   CHOL 166 08/16/2020   HDL 41.30 08/16/2020   LDLCALC 99 08/16/2020   TRIG 129.0 08/16/2020   CHOLHDL 4 08/16/2020    Hepatic Function Latest Ref Rng & Units 08/16/2020 03/10/2020 12/01/2019  Total Protein 6.0 - 8.3 g/dL 7.9 8.0 7.9  Albumin 3.5 - 5.2 g/dL 4.2 3.7 4.2  AST 0 - 37 U/L $Remo'20 18 27  'fxEjp$ ALT 0 - 35 U/L 26 30 41(H)  Alk Phosphatase 39 - 117 U/L 93 107 93  Total Bilirubin 0.2 - 1.2 mg/dL 0.3 <0.2(L) 0.3    Lab Results  Component Value Date/Time   TSH 0.947 01/08/2018 11:05 AM    CBC Latest Ref Rng & Units 08/16/2020 03/10/2020 04/15/2019  WBC 4.0 - 10.5 K/uL 10.6(H) 10.6(H) 11.3(H)  Hemoglobin 12.0 - 15.0 g/dL 13.9 13.0 13.0  Hematocrit 36.0 - 46.0 % 41.4 39.7 39.0  Platelets 150.0 - 400.0 K/uL 214.0 206 223    No results found for: VD25OH  Clinical ASCVD: No  The 10-year ASCVD risk score Mikey Bussing DC Jr., et al., 2013)  is: 15.6%   Values used to calculate the score:     Age: 58 years     Sex: Female     Is Non-Hispanic African American: Yes     Diabetic: Yes     Tobacco smoker: No     Systolic Blood Pressure: 355 mmHg     Is BP treated: Yes     HDL Cholesterol: 41.3 mg/dL     Total Cholesterol: 166 mg/dL    Depression screen [facility] 2/9 08/16/2020 03/28/2017  Decreased Interest 0 1  Down, Depressed, Hopeless 0 1  PHQ - 2 Score 0 2  Altered sleeping 1 -  Tired, decreased energy 1 -  Change in appetite 0  -  Feeling bad or failure about yourself  1 -  Trouble concentrating 0 -  Moving slowly or fidgety/restless 1 -  Suicidal thoughts 0 -  PHQ-9 Score 4 -  Difficult doing work/chores Somewhat difficult -  Some recent data might be hidden    GAD 7 : Generalized Anxiety Score 08/16/2020  Nervous, Anxious, on Edge 1  Control/stop worrying 1  Worry too much - different things 1  Trouble relaxing 1  Restless 0  Easily annoyed or irritable 0  Afraid - awful might happen 3  Total GAD 7 Score 7  Anxiety Difficulty Somewhat difficult     Social History   Tobacco Use  Smoking Status Former Smoker  . Packs/day: 0.10  . Years: 28.00  . Pack years: 2.80  . Quit date: 09/02/2018  . Years since quitting: 2.1  Smokeless Tobacco Never Used   BP Readings from Last 3 Encounters:  08/16/20 130/80  03/10/20 (!) 142/97  12/01/19 124/82   Pulse Readings from Last 3 Encounters:  08/16/20 80  03/10/20 89  12/01/19 100   Wt Readings from Last 3 Encounters:  08/16/20 269 lb (122 kg)  03/10/20 260 lb 6.4 oz (118.1 kg)  03/10/20 261 lb 12.8 oz (118.8 kg)   BMI Readings from Last 3 Encounters:  08/16/20 39.72 kg/m  03/10/20 38.45 kg/m  03/10/20 38.66 kg/m    Assessment/Interventions: Review of patient past medical history, allergies, medications, health status, including review of consultants reports, laboratory and other test data, was performed as part of comprehensive evaluation and provision of chronic care management services.   SDOH:  (Social Determinants of Health) assessments and interventions performed: Yes  SDOH Screenings   Alcohol Screen: Not on file  Depression (PHQ2-9): Low Risk   . PHQ-2 Score: 4  Financial Resource Strain: Not on file  Food Insecurity: Not on file  Housing: Not on file  Physical Activity: Not on file  Social Connections: Not on file  Stress: Not on file  Tobacco Use: Medium Risk  . Smoking Tobacco Use: Former Smoker  . Smokeless Tobacco Use:  Never Used  Transportation Needs: Not on file    CCM Care Plan  Allergies  Allergen Reactions  . Banana Hives    Tongue itching  . Latex Itching and Other (See Comments)    burning  . 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 Wheat Bran Hives    Medications Reviewed Today    Reviewed by Hoyt Koch, MD (Physician) on 08/16/20 at 1041  Med List Status: <None>  Medication Order Taking? Sig Documenting Provider Last Dose Status Informant  amLODipine (NORVASC) 5 MG tablet [id number] Yes TAKE 1 TABLET (5 MG TOTAL) BY MOUTH DAILY. Magrinat, Virgie Dad, MD  Taking Active   anastrozole (ARIMIDEX) 1 MG tablet [id number] Yes Take 1 tablet (1 mg total) by mouth daily. Magrinat, Virgie Dad, MD Taking Active   baclofen (LIORESAL) 20 MG tablet [id number] Yes Take 1 tablet (20 mg total) by mouth 3 (three) times daily. Hoyt Koch, MD Taking Active   blood glucose meter kit and supplies [id number] Yes Dispense based on patient and insurance preference. Use up to three times daily as directed. (FOR ICD-10 E10.9, E11.9). Hoyt Koch, MD Taking Active   busPIRone (BUSPAR) 5 MG tablet [id number] Yes TAKE 1 TABLET BY MOUTH ONCE A DAY AS NEEDED FOR ANXIETY Hoyt Koch, MD Taking Active   calcium-vitamin D (OSCAL WITH D) 500-200 MG-UNIT tablet [id number] Yes Take 1 tablet by mouth 2 (two) times daily. Gardenia Phlegm, NP Taking Active Self  diphenhydrAMINE (BENADRYL) 25 mg capsule [id number] Yes Take 1 capsule (25 mg total) by mouth every 6 (six) hours as needed.  Patient taking differently: Take 25 mg by mouth every 6 (six) hours as needed for allergies.   Magrinat, Virgie Dad, MD Taking Active Self  empagliflozin (JARDIANCE) 25 MG TABS tablet [id number] Yes Take 1 tablet (25 mg total) by mouth daily before breakfast. Hoyt Koch, MD Taking Active   gabapentin (NEURONTIN) 300 MG capsule [id number] Yes Take 2 capsules (600 mg total) by  mouth 3 (three) times daily. Magrinat, Virgie Dad, MD Taking Active   ibuprofen (ADVIL) 800 MG tablet [id number] Yes TAKE 1 TABLET BY MOUTH EVERY 8 HOURS AS NEEDED Hoyt Koch, MD Taking Active   losartan-hydrochlorothiazide [facility]) 100-25 MG tablet [id number] Yes TAKE 1 TABLET BY MOUTH ONCE DAILY Magrinat, Virgie Dad, MD Taking Active   Multiple Vitamins-Minerals (CENTRUM SILVER 50+WOMEN PO) [id number] Yes Take 1 tablet by mouth daily. [provider] Taking Active Self  pantoprazole (PROTONIX) 40 MG tablet [id number] Yes TAKE 1 TABLET BY MOUTH ONCE A DAY Magrinat, Virgie Dad, MD Taking Active   potassium chloride (MICRO-K) 10 MEQ CR capsule [id number] Yes Take 2 capsules (20 mEq total) by mouth 2 (two) times daily. Magrinat, Virgie Dad, MD Taking Active   traMADol Veatrice Bourbon) 50 MG tablet [id number] Yes TAKE 1 TABLET BY MOUTH ONCE DAILY AS NEEDED Plotnikov, Evie Lacks, MD Taking Active   traZODone (DESYREL) 50 MG tablet [id number] Yes TAKE 1 TABLET BY MOUTH AT BEDTIME. MAY INCREASE TO 2 TABLETS AFTER FEW DAYS IF NEEDED Magrinat, Virgie Dad, MD Taking Active   valACYclovir (VALTREX) 1000 MG tablet [id number] Yes TAKE 1 TABLET BY MOUTH 2 TIMES DAILY. Magrinat, Virgie Dad, MD Taking Active   venlafaxine XR (EFFEXOR-XR) 150 MG 24 hr capsule [id number] Yes TAKE 1 CAPSULE BY MOUTH ONCE DAILY WITH BREAKFAST Magrinat, Virgie Dad, MD Taking Active           Patient Active Problem List   Diagnosis Date Noted  . Lumbar radiculopathy 12/01/2019  . Bilateral hip pain 12/01/2019  . Urge incontinence 12/01/2019  . Screening for lipid disorders 05/19/2019  . Diabetes mellitus ([place]) 07/15/2018  . Routine general medical examination at a health care facility 07/15/2018  . Adjustment disorder with mixed anxiety and depressed mood 07/17/2016  . Morbid obesity ([place]) 11/28/2015  . Chest wall pain 11/28/2015  . Malignant neoplasm of upper-inner quadrant of right breast in female, estrogen receptor positive ([place])  04/27/2015  . Hypertension, essential 04/27/2015  . Malignant neoplasm of upper-outer quadrant of left breast in female, estrogen receptor positive ([place]) 04/21/2015    Immunization History  Administered Date(s) Administered  . Influenza,inj,Quad PF,6+ Mos 09/15/2015, 05/18/2016, 05/22/2018, 05/13/2019, 08/16/2020  . PFIZER(Purple Top)SARS-COV-2 Vaccination 10/29/2019, 11/19/2019, 07/07/2020  . Tdap 05/07/2015    Conditions to be addressed/monitored:  {USCCMDZASSESSMENTOPTIONS:23563}  There are no care plans that you recently modified to display for this patient.    Medication Assistance: {MEDASSISTANCEINFO:25044}  Patient's preferred pharmacy is:  [place], [place] - [address] [place] [address] Phone: [phone] Fax: [phone]  Uses pill box? {Yes or If no, why not?:20788} Pt endorses ***% compliance  We discussed: {Pharmacy options:24294} Patient decided to: {US Pharmacy Plan:23885}  Care Plan and Follow Up Patient Decision:  {FOLLOWUP:24991}  Plan: {CM FOLLOW UP PLAN:25073}  ***    Current Barriers:  . {pharmacybarriers:24917} . ***  Pharmacist Clinical Goal(s):  Marland Kitchen Patient will {PHARMACYGOALCHOICES:24921} through collaboration with PharmD and provider.  . ***  Interventions: . 1:1 collaboration with Hoyt Koch, MD regarding development and update of comprehensive plan of care as evidenced by provider attestation and co-signature . Inter-disciplinary care team collaboration (see longitudinal plan of care) . Comprehensive medication review performed; medication list updated in electronic medical record  Hypertension (BP goal <130/80) -{US controlled/uncontrolled:25276} -Current treatment: . Amlodipine 5 mg daily . Olmesartan-amlodipine-HCTZ 20-5-12.5 mg daily? -Medications previously tried: ***  -Current home readings: *** -Current dietary habits: *** -Current exercise  habits: *** -{ACTIONS;DENIES/REPORTS:21021675::"Denies"} hypotensive/hypertensive symptoms -Educated on {CCM BP Counseling:25124} -Counseled to monitor BP at home ***, document, and provide log at future appointments -{CCMPHARMDINTERVENTION:25122}  Hyperlipidemia: (LDL goal < 100) -{US controlled/uncontrolled:25276} -Current treatment: . Atorvastatin 20 mg daily -Medications previously tried: ***  -Current dietary patterns: *** -Current exercise habits: *** -Educated on {CCM HLD Counseling:25126} -{CCMPHARMDINTERVENTION:25122}  Diabetes (A1c goal <7%) -{US controlled/uncontrolled:25276} -Current medications: . Jardiance 25 mg daily . Glimepiride 2 mg daily . Testing supplies -Medications previously tried: metformin  -Current home glucose readings . fasting glucose: *** . post prandial glucose: *** -{ACTIONS;DENIES/REPORTS:21021675::"Denies"} hypoglycemic/hyperglycemic symptoms -Current meal patterns:  . breakfast: ***  . lunch: ***  . dinner: *** . snacks: *** . drinks: *** -Current exercise: *** -Educated on {CCM DM COUNSELING:25123} -Counseled to check feet daily and get yearly eye exams -{CCMPHARMDINTERVENTION:25122}  Depression/Anxiety (Goal: ***) -{US controlled/uncontrolled:25276} -Current treatment: . Venlafaxine XR 150 mg daily . Buspirone 5 mg daily PRN . Trazodone 50 mg 1-2 tab HS -Medications previously tried/failed: *** -PHQ9: 4 (08/2020) -GAD7: 7 (08/2020) -Connected with PCP, oncologist for mental health support -Educated on {CCM mental health counseling:25127} -{CCMPHARMDINTERVENTION:25122}  Back pain (Goal: ***) -{US controlled/uncontrolled:25276} -Current treatment  . Baclofen 20 mg TID . Gabapentin 300 mg - 2 cap TID . Ibuprofen 800 mg TID prn . Tramadol 50 mg dailyPRN -Medications previously tried: ***  -{CCMPHARMDINTERVENTION:25122}  Hx breast cancer (Goal: ***) -{US controlled/uncontrolled:25276}  -Dx 2016, s/p chemotherapy. No  recurrent disease. -Current treatment  . Anastrozole 1 mg daily (started 04/2016) . Valacyclovir 1000 mg BID (mouth sores) -Medications previously tried: ***  -{CCMPHARMDINTERVENTION:25122}  GERD (Goal: ***) -{US controlled/uncontrolled:25276} -Current treatment  . Pantoprazole 40 mg daily -Medications previously tried: ***  -{CCMPHARMDINTERVENTION:25122}  Health Maintenance -Vaccine gaps: PNA, shingrix -Current therapy:  . Calcium-Vitamin D 500-200 mg-IU BID . Multivitamin . Potassium chloride 10 mEq - 2 cap BID . Benadryl 25 mg PRN -Educated on {ccm supplement counseling:25128} -{CCM Patient satisfied:25129} -{CCMPHARMDINTERVENTION:25122}  Patient Goals/Self-Care Activities . Patient will:  - {pharmacypatientgoals:24919}  Follow Up Plan: {CM FOLLOW UP YIRS:85462}

## 2020-11-02 NOTE — Progress Notes (Signed)
    Chronic Care Management Pharmacy Assistant   Name: Margaret [name]  MRN: [id number] DOB: January 18, 1961  Margaret [name] is an 60 y.o. year old female who presents for his initial CCM visit with the clinical pharmacist.  Reason for Encounter: Initial Questions     Hospital visits:  None in previous 6 months  Medications: Outpatient Encounter Medications as of 11/01/2020  Medication Sig  . amLODipine (NORVASC) 5 MG tablet Take 1 tablet (5 mg total) by mouth daily.  Marland Kitchen anastrozole (ARIMIDEX) 1 MG tablet Take 1 tablet (1 mg total) by mouth daily.  Marland Kitchen atorvastatin (LIPITOR) 20 MG tablet Take 1 tablet (20 mg total) by mouth daily.  . baclofen (LIORESAL) 20 MG tablet TAKE 1 TABLET BY MOUTH THREE TIMES DAILY  . blood glucose meter kit and supplies Dispense based on patient and insurance preference. Use up to three times daily as directed. (FOR ICD-10 E10.9, E11.9).  . busPIRone (BUSPAR) 5 MG tablet TAKE 1 TABLET BY MOUTH ONCE A DAY AS NEEDED FOR ANXIETY  . calcium-vitamin D (OSCAL WITH D) 500-200 MG-UNIT tablet Take 1 tablet by mouth 2 (two) times daily.  . diphenhydrAMINE (BENADRYL) 25 mg capsule Take 1 capsule (25 mg total) by mouth every 6 (six) hours as needed. (Patient taking differently: Take 25 mg by mouth every 6 (six) hours as needed for allergies.)  . empagliflozin (JARDIANCE) 25 MG TABS tablet Take 1 tablet (25 mg total) by mouth daily before breakfast.  . gabapentin (NEURONTIN) 300 MG capsule Take 2 capsules (600 mg total) by mouth 3 (three) times daily.  Marland Kitchen glimepiride (AMARYL) 2 MG tablet Take 1 tablet (2 mg total) by mouth daily before breakfast.  . ibuprofen (ADVIL) 800 MG tablet TAKE 1 TABLET BY MOUTH EVERY 8 HOURS AS NEEDED  . Multiple Vitamins-Minerals (CENTRUM SILVER 50+WOMEN PO) Take 1 tablet by mouth daily.  . pantoprazole (PROTONIX) 40 MG tablet TAKE 1 TABLET BY MOUTH ONCE DAILY  . potassium chloride (MICRO-K) 10 MEQ CR capsule Take 2 capsules (20 mEq total) by mouth 2  (two) times daily.  . traMADol (ULTRAM) 50 MG tablet Take 1 tablet (50 mg total) by mouth daily as needed.  . traZODone (DESYREL) 50 MG tablet TAKE 1 TABLET BY MOUTH AT BEDTIME. MAY INCREASE TO 2 TABLETS AFTER FEW DAYS IF NEEDED  . valACYclovir (VALTREX) 1000 MG tablet TAKE 1 TABLET BY MOUTH 2 TIMES DAILY.  Marland Kitchen venlafaxine XR (EFFEXOR-XR) 150 MG 24 hr capsule TAKE 1 CAPSULE BY MOUTH ONCE DAILY WITH BREAKFAST   No facility-administered encounter medications on file as of 11/01/2020.    A call was made to go over initial questions for Initial visit with the clinical pharmacist on 11/01/20. Patient never answer phone call or returned message.   Star Rating Drugs: Olmesartan-amlodipine-HCTZ 20-5-12.5 mg take 1 tablet by mouth 1 day 1 dose for 1 dose Last refill date 09/16/20   Ethelene Hal Clinical Pharmacist Assistant [phone]

## 2020-11-11 ENCOUNTER — Other Ambulatory Visit (HOSPITAL_COMMUNITY): Payer: Self-pay

## 2020-11-15 ENCOUNTER — Other Ambulatory Visit (HOSPITAL_COMMUNITY): Payer: Self-pay

## 2020-11-15 MED FILL — Gabapentin Cap 300 MG: ORAL | 30 days supply | Qty: 180 | Fill #0 | Status: AC

## 2020-11-15 MED FILL — Pantoprazole Sodium EC Tab 40 MG (Base Equiv): ORAL | 30 days supply | Qty: 30 | Fill #0 | Status: AC

## 2020-11-15 MED FILL — Trazodone HCl Tab 50 MG: ORAL | 30 days supply | Qty: 60 | Fill #0 | Status: AC

## 2020-11-22 ENCOUNTER — Other Ambulatory Visit (HOSPITAL_COMMUNITY): Payer: Self-pay

## 2020-11-22 ENCOUNTER — Other Ambulatory Visit: Payer: Self-pay | Admitting: Internal Medicine

## 2020-11-22 DIAGNOSIS — C50211 Malignant neoplasm of upper-inner quadrant of right female breast: Secondary | ICD-10-CM

## 2020-11-22 DIAGNOSIS — Z170 Estrogen receptor positive status [ER+]: Secondary | ICD-10-CM

## 2020-11-22 DIAGNOSIS — C50412 Malignant neoplasm of upper-outer quadrant of left female breast: Secondary | ICD-10-CM

## 2020-11-22 MED FILL — Baclofen Tab 20 MG: ORAL | 30 days supply | Qty: 90 | Fill #0 | Status: AC

## 2020-11-23 ENCOUNTER — Other Ambulatory Visit (HOSPITAL_COMMUNITY): Payer: Self-pay

## 2020-11-23 NOTE — Telephone Encounter (Signed)
This is early refill compared to prior fills for tramadol and she has filled hydrocodone since our last visit so will need visit for refill of tramadol.

## 2020-11-24 ENCOUNTER — Other Ambulatory Visit (HOSPITAL_COMMUNITY): Payer: Self-pay

## 2020-11-25 ENCOUNTER — Other Ambulatory Visit (HOSPITAL_COMMUNITY): Payer: Self-pay

## 2020-11-25 MED ORDER — IBUPROFEN 800 MG PO TABS
ORAL_TABLET | Freq: Three times a day (TID) | ORAL | 0 refills | Status: DC | PRN
  Filled 2020-11-25: qty 90, 30d supply, fill #0

## 2020-11-25 NOTE — Telephone Encounter (Signed)
Patient called and is requesting a refill for ibuprofen (ADVIL) 800 MG tablet. She is scheduled for a med refill on 12-08-20. Please advise

## 2020-11-25 NOTE — Telephone Encounter (Signed)
Ibuprofen okay to fill

## 2020-12-08 ENCOUNTER — Ambulatory Visit: Payer: Medicare Other | Admitting: Internal Medicine

## 2020-12-08 ENCOUNTER — Other Ambulatory Visit: Payer: Self-pay

## 2020-12-09 ENCOUNTER — Other Ambulatory Visit (HOSPITAL_COMMUNITY): Payer: Self-pay

## 2020-12-09 MED FILL — Atorvastatin Calcium Tab 20 MG (Base Equivalent): ORAL | 90 days supply | Qty: 90 | Fill #0 | Status: AC

## 2020-12-13 ENCOUNTER — Other Ambulatory Visit: Payer: Self-pay

## 2020-12-13 ENCOUNTER — Ambulatory Visit (INDEPENDENT_AMBULATORY_CARE_PROVIDER_SITE_OTHER): Payer: Medicare Other | Admitting: Internal Medicine

## 2020-12-13 ENCOUNTER — Encounter: Payer: Self-pay | Admitting: Internal Medicine

## 2020-12-13 ENCOUNTER — Telehealth: Payer: Self-pay

## 2020-12-13 ENCOUNTER — Other Ambulatory Visit (HOSPITAL_COMMUNITY): Payer: Self-pay

## 2020-12-13 VITALS — BP 140/80 | HR 86 | Temp 98.7°F | Ht 69.0 in | Wt 248.8 lb

## 2020-12-13 DIAGNOSIS — Z170 Estrogen receptor positive status [ER+]: Secondary | ICD-10-CM | POA: Diagnosis not present

## 2020-12-13 DIAGNOSIS — C50211 Malignant neoplasm of upper-inner quadrant of right female breast: Secondary | ICD-10-CM | POA: Diagnosis not present

## 2020-12-13 DIAGNOSIS — Z23 Encounter for immunization: Secondary | ICD-10-CM | POA: Diagnosis not present

## 2020-12-13 DIAGNOSIS — R0789 Other chest pain: Secondary | ICD-10-CM

## 2020-12-13 DIAGNOSIS — E785 Hyperlipidemia, unspecified: Secondary | ICD-10-CM

## 2020-12-13 DIAGNOSIS — K047 Periapical abscess without sinus: Secondary | ICD-10-CM | POA: Insufficient documentation

## 2020-12-13 DIAGNOSIS — E1169 Type 2 diabetes mellitus with other specified complication: Secondary | ICD-10-CM

## 2020-12-13 DIAGNOSIS — C50412 Malignant neoplasm of upper-outer quadrant of left female breast: Secondary | ICD-10-CM

## 2020-12-13 LAB — COMPREHENSIVE METABOLIC PANEL
ALT: 37 U/L — ABNORMAL HIGH (ref 0–35)
AST: 26 U/L (ref 0–37)
Albumin: 4.1 g/dL (ref 3.5–5.2)
Alkaline Phosphatase: 118 U/L — ABNORMAL HIGH (ref 39–117)
BUN: 13 mg/dL (ref 6–23)
CO2: 26 mEq/L (ref 19–32)
Calcium: 9.4 mg/dL (ref 8.4–10.5)
Chloride: 104 mEq/L (ref 96–112)
Creatinine, Ser: 0.71 mg/dL (ref 0.40–1.20)
GFR: 92.95 mL/min (ref >60.00)
Glucose, Bld: 148 mg/dL — ABNORMAL HIGH (ref 70–99)
Potassium: 3.2 mEq/L — ABNORMAL LOW (ref 3.5–5.1)
Sodium: 140 mEq/L (ref 135–145)
Total Bilirubin: 0.3 mg/dL (ref 0.2–1.2)
Total Protein: 7.9 g/dL (ref 6.0–8.3)

## 2020-12-13 LAB — HEMOGLOBIN A1C: Hgb A1c MFr Bld: 8.1 % — ABNORMAL HIGH (ref 4.6–6.5)

## 2020-12-13 MED ORDER — IBUPROFEN 800 MG PO TABS
ORAL_TABLET | Freq: Three times a day (TID) | ORAL | 5 refills | Status: DC | PRN
  Filled 2020-12-13: qty 90, 30d supply, fill #0
  Filled 2021-01-18: qty 90, 30d supply, fill #1
  Filled 2021-02-20: qty 90, 30d supply, fill #2
  Filled 2021-03-27: qty 90, 30d supply, fill #3

## 2020-12-13 MED ORDER — AMOXICILLIN 500 MG PO CAPS
500.0000 mg | ORAL_CAPSULE | Freq: Three times a day (TID) | ORAL | 0 refills | Status: AC
  Filled 2020-12-13: qty 21, 7d supply, fill #0

## 2020-12-13 MED ORDER — TRAMADOL HCL 50 MG PO TABS
ORAL_TABLET | Freq: Every day | ORAL | 5 refills | Status: DC | PRN
  Filled 2020-12-13: qty 30, 30d supply, fill #0
  Filled 2021-01-16: qty 30, 30d supply, fill #1
  Filled 2021-02-14: qty 30, 30d supply, fill #2
  Filled 2021-03-13: qty 30, 30d supply, fill #3
  Filled 2021-04-12: qty 30, 30d supply, fill #4
  Filled 2021-04-27: qty 30, 30d supply, fill #5

## 2020-12-13 NOTE — Assessment & Plan Note (Signed)
Checking HgA1c, we have started amaryl since that check. Previously above goal and hoping she will be at goal now. Taking amaryl and jardiance. On statin and ARB.

## 2020-12-13 NOTE — Assessment & Plan Note (Signed)
Taking lipitor 20 mg daily.  °

## 2020-12-13 NOTE — Progress Notes (Signed)
   Subjective:   Patient ID: Margaret [name], female    DOB: 05/22/1961, 60 y.o.   MRN: [id number]  HPI The patient is a 60 YO female coming in for follow up poorly controlled diabetes (has started amaryl since last visit, still taking jardiance also, denies problems with this, noticed sugars are coming down some, denies low sugar readings), and chronic pain (taking tramadol daily, denies overuse or misuse, had prescription for hydrocodone back in January which we wanted to ask reason, this was related to dental problem and tooth extraction and is not ongoing, denies side effects), and current tooth infection (she cannot get to dentist until summer, knows that this one needs to be pulled, is hurting and red and she knows it is infected as it feels like the others have in the past, taking ibuprofen for pain and needs refill on this).   Review of Systems  Constitutional: Negative.   HENT: Positive for dental problem.   Eyes: Negative.   Respiratory: Negative for cough, chest tightness and shortness of breath.   Cardiovascular: Positive for chest pain. Negative for palpitations and leg swelling.  Gastrointestinal: Negative for abdominal distention, abdominal pain, constipation, diarrhea, nausea and vomiting.  Musculoskeletal: Negative.   Skin: Negative.   Neurological: Negative.   Psychiatric/Behavioral: Negative.     Objective:  Physical Exam Constitutional:      Appearance: She is well-developed. She is obese.  HENT:     Head: Normocephalic and atraumatic.  Cardiovascular:     Rate and Rhythm: Normal rate and regular rhythm.  Pulmonary:     Effort: Pulmonary effort is normal. No respiratory distress.     Breath sounds: Normal breath sounds. No wheezing or rales.  Abdominal:     General: Bowel sounds are normal. There is no distension.     Palpations: Abdomen is soft.     Tenderness: There is no abdominal tenderness. There is no rebound.  Musculoskeletal:     Cervical back: Normal  range of motion.  Skin:    General: Skin is warm and dry.  Neurological:     Mental Status: She is alert and oriented to person, place, and time.     Coordination: Coordination normal.     Vitals:   12/13/20 0949  BP: 140/80  Pulse: 86  Temp: 98.7 F (37.1 C)  TempSrc: Oral  SpO2: 96%  Weight: 248 lb 12.8 oz (112.9 kg)  Height: 5\' 9"  (1.753 m)   This visit occurred during the SARS-CoV-2 public health emergency.  Safety protocols were in place, including screening questions prior to the visit, additional usage of staff PPE, and extensive cleaning of exam room while observing appropriate contact time as indicated for disinfecting solutions.   Assessment & Plan:

## 2020-12-13 NOTE — Patient Instructions (Addendum)
We have refilled the tramadol for you.  We have sent in an antibiotic for the teeth amoxicillin to take 1 pill 3 times a day for 7 days.  We will check the labs today.

## 2020-12-13 NOTE — Assessment & Plan Note (Signed)
Rx ibuprofen for pain and amoxicillin for infection and advised to follow up with dentist when able.

## 2020-12-13 NOTE — Assessment & Plan Note (Signed)
Taking tramadol daily for pain. This is resultant from breast cancer and treatment. Hydrocodone prescription noted on [place] narcotic database and was for unrelated dental pain. Refilled tramadol today.

## 2020-12-18 ENCOUNTER — Other Ambulatory Visit: Payer: Self-pay | Admitting: Oncology

## 2020-12-18 DIAGNOSIS — G629 Polyneuropathy, unspecified: Secondary | ICD-10-CM

## 2020-12-18 DIAGNOSIS — C50012 Malignant neoplasm of nipple and areola, left female breast: Secondary | ICD-10-CM

## 2020-12-18 DIAGNOSIS — C50011 Malignant neoplasm of nipple and areola, right female breast: Secondary | ICD-10-CM

## 2020-12-18 MED FILL — Pantoprazole Sodium EC Tab 40 MG (Base Equiv): ORAL | 30 days supply | Qty: 30 | Fill #1 | Status: AC

## 2020-12-18 MED FILL — Olmesartan-Amlodipine-Hydrochlorothiazide Tab 20-5-12.5 MG: ORAL | 90 days supply | Qty: 90 | Fill #0 | Status: AC

## 2020-12-18 MED FILL — Empagliflozin Tab 25 MG: ORAL | 90 days supply | Qty: 90 | Fill #0 | Status: AC

## 2020-12-18 MED FILL — Trazodone HCl Tab 50 MG: ORAL | 30 days supply | Qty: 60 | Fill #1 | Status: AC

## 2020-12-19 ENCOUNTER — Other Ambulatory Visit (HOSPITAL_COMMUNITY): Payer: Self-pay

## 2020-12-19 MED ORDER — GABAPENTIN 300 MG PO CAPS
ORAL_CAPSULE | ORAL | 2 refills | Status: DC
  Filled 2020-12-19: qty 180, 30d supply, fill #0
  Filled 2021-02-01: qty 180, 30d supply, fill #1
  Filled 2021-03-23: qty 180, 30d supply, fill #2

## 2020-12-19 MED ORDER — VALACYCLOVIR HCL 1 G PO TABS
ORAL_TABLET | Freq: Two times a day (BID) | ORAL | 4 refills | Status: DC
  Filled 2020-12-19: qty 60, 30d supply, fill #0
  Filled 2021-01-18: qty 60, 30d supply, fill #1
  Filled 2021-03-13: qty 60, 30d supply, fill #2
  Filled 2021-04-21: qty 60, 30d supply, fill #3
  Filled 2021-06-07: qty 60, 30d supply, fill #4
  Filled 2021-07-25: qty 60, 30d supply, fill #5
  Filled 2021-08-31: qty 60, 30d supply, fill #6
  Filled 2021-10-31: qty 60, 30d supply, fill #7

## 2020-12-20 ENCOUNTER — Other Ambulatory Visit (HOSPITAL_COMMUNITY): Payer: Self-pay

## 2020-12-27 ENCOUNTER — Other Ambulatory Visit (HOSPITAL_COMMUNITY): Payer: Self-pay

## 2020-12-27 ENCOUNTER — Other Ambulatory Visit: Payer: Self-pay | Admitting: Oncology

## 2020-12-27 ENCOUNTER — Other Ambulatory Visit: Payer: Self-pay | Admitting: Internal Medicine

## 2020-12-27 MED ORDER — ANASTROZOLE 1 MG PO TABS
ORAL_TABLET | Freq: Every day | ORAL | 0 refills | Status: DC
  Filled 2020-12-27: qty 90, 90d supply, fill #0

## 2020-12-27 MED FILL — Baclofen Tab 20 MG: ORAL | 30 days supply | Qty: 90 | Fill #1 | Status: AC

## 2020-12-28 ENCOUNTER — Other Ambulatory Visit (HOSPITAL_COMMUNITY): Payer: Self-pay

## 2020-12-28 MED ORDER — GLUCOSE BLOOD VI STRP
ORAL_STRIP | 0 refills | Status: DC
  Filled 2020-12-28: qty 100, 30d supply, fill #0

## 2020-12-28 MED ORDER — ONETOUCH DELICA PLUS LANCET33G MISC
0 refills | Status: DC
  Filled 2020-12-28: qty 100, 30d supply, fill #0

## 2020-12-28 MED ORDER — BLOOD GLUCOSE MONITOR SYSTEM W/DEVICE KIT
PACK | 0 refills | Status: AC
  Filled 2020-12-28: qty 1, 30d supply, fill #0

## 2020-12-29 ENCOUNTER — Other Ambulatory Visit (HOSPITAL_COMMUNITY): Payer: Self-pay

## 2021-01-16 ENCOUNTER — Other Ambulatory Visit (HOSPITAL_COMMUNITY): Payer: Self-pay

## 2021-01-17 ENCOUNTER — Other Ambulatory Visit (HOSPITAL_COMMUNITY): Payer: Self-pay

## 2021-01-18 ENCOUNTER — Other Ambulatory Visit (HOSPITAL_COMMUNITY): Payer: Self-pay

## 2021-02-01 ENCOUNTER — Other Ambulatory Visit (HOSPITAL_COMMUNITY): Payer: Self-pay

## 2021-02-01 ENCOUNTER — Other Ambulatory Visit: Payer: Self-pay | Admitting: Oncology

## 2021-02-01 ENCOUNTER — Other Ambulatory Visit: Payer: Self-pay | Admitting: Internal Medicine

## 2021-02-01 DIAGNOSIS — Z170 Estrogen receptor positive status [ER+]: Secondary | ICD-10-CM

## 2021-02-01 DIAGNOSIS — G4720 Circadian rhythm sleep disorder, unspecified type: Secondary | ICD-10-CM

## 2021-02-01 MED ORDER — TRAZODONE HCL 50 MG PO TABS
ORAL_TABLET | ORAL | 6 refills | Status: DC
  Filled 2021-02-01: qty 60, 30d supply, fill #0
  Filled 2021-03-13: qty 60, 30d supply, fill #1
  Filled 2021-05-08: qty 60, 30d supply, fill #2
  Filled 2021-06-12: qty 60, 30d supply, fill #3
  Filled 2021-07-12: qty 60, 30d supply, fill #4
  Filled 2021-08-09: qty 60, 30d supply, fill #5
  Filled 2021-09-14: qty 60, 30d supply, fill #6

## 2021-02-01 MED ORDER — PANTOPRAZOLE SODIUM 40 MG PO TBEC
DELAYED_RELEASE_TABLET | Freq: Every day | ORAL | 2 refills | Status: DC
  Filled 2021-02-01: qty 30, 30d supply, fill #0
  Filled 2021-03-13: qty 30, 30d supply, fill #1
  Filled 2021-04-12: qty 30, 30d supply, fill #2

## 2021-02-01 MED FILL — Venlafaxine HCl Cap ER 24HR 150 MG (Base Equivalent): ORAL | 90 days supply | Qty: 90 | Fill #0 | Status: AC

## 2021-02-01 NOTE — Telephone Encounter (Signed)
Error

## 2021-02-02 ENCOUNTER — Other Ambulatory Visit (HOSPITAL_COMMUNITY): Payer: Self-pay

## 2021-02-03 ENCOUNTER — Other Ambulatory Visit (HOSPITAL_COMMUNITY): Payer: Self-pay

## 2021-02-03 MED ORDER — BACLOFEN 20 MG PO TABS
ORAL_TABLET | Freq: Three times a day (TID) | ORAL | 0 refills | Status: DC
  Filled 2021-02-03: qty 90, 30d supply, fill #0

## 2021-02-14 ENCOUNTER — Other Ambulatory Visit (HOSPITAL_COMMUNITY): Payer: Self-pay

## 2021-02-15 ENCOUNTER — Other Ambulatory Visit (HOSPITAL_COMMUNITY): Payer: Self-pay

## 2021-02-20 ENCOUNTER — Other Ambulatory Visit (HOSPITAL_COMMUNITY): Payer: Self-pay

## 2021-03-08 ENCOUNTER — Other Ambulatory Visit: Payer: Self-pay | Admitting: *Deleted

## 2021-03-08 DIAGNOSIS — C50412 Malignant neoplasm of upper-outer quadrant of left female breast: Secondary | ICD-10-CM

## 2021-03-13 ENCOUNTER — Other Ambulatory Visit (HOSPITAL_COMMUNITY): Payer: Self-pay

## 2021-03-13 ENCOUNTER — Other Ambulatory Visit: Payer: Self-pay | Admitting: Oncology

## 2021-03-13 MED ORDER — POTASSIUM CHLORIDE ER 10 MEQ PO CPCR
ORAL_CAPSULE | Freq: Two times a day (BID) | ORAL | 1 refills | Status: DC
  Filled 2021-03-13: qty 180, 45d supply, fill #0

## 2021-03-13 MED FILL — Atorvastatin Calcium Tab 20 MG (Base Equivalent): ORAL | 90 days supply | Qty: 90 | Fill #1 | Status: AC

## 2021-03-14 ENCOUNTER — Other Ambulatory Visit (HOSPITAL_COMMUNITY): Payer: Self-pay

## 2021-03-14 ENCOUNTER — Inpatient Hospital Stay (HOSPITAL_BASED_OUTPATIENT_CLINIC_OR_DEPARTMENT_OTHER): Payer: Medicare Other | Admitting: Oncology

## 2021-03-14 ENCOUNTER — Other Ambulatory Visit: Payer: Self-pay

## 2021-03-14 ENCOUNTER — Inpatient Hospital Stay: Payer: Medicare Other | Attending: Oncology

## 2021-03-14 ENCOUNTER — Encounter: Payer: Self-pay | Admitting: *Deleted

## 2021-03-14 VITALS — BP 139/89 | HR 85 | Temp 97.5°F | Resp 18 | Ht 69.0 in | Wt 250.3 lb

## 2021-03-14 DIAGNOSIS — Z9013 Acquired absence of bilateral breasts and nipples: Secondary | ICD-10-CM

## 2021-03-14 DIAGNOSIS — Z006 Encounter for examination for normal comparison and control in clinical research program: Secondary | ICD-10-CM | POA: Insufficient documentation

## 2021-03-14 DIAGNOSIS — Z170 Estrogen receptor positive status [ER+]: Secondary | ICD-10-CM | POA: Insufficient documentation

## 2021-03-14 DIAGNOSIS — Z853 Personal history of malignant neoplasm of breast: Secondary | ICD-10-CM | POA: Insufficient documentation

## 2021-03-14 DIAGNOSIS — C50412 Malignant neoplasm of upper-outer quadrant of left female breast: Secondary | ICD-10-CM

## 2021-03-14 DIAGNOSIS — Z923 Personal history of irradiation: Secondary | ICD-10-CM | POA: Insufficient documentation

## 2021-03-14 DIAGNOSIS — Z9221 Personal history of antineoplastic chemotherapy: Secondary | ICD-10-CM

## 2021-03-14 LAB — COMPREHENSIVE METABOLIC PANEL
ALT: 32 U/L (ref 0–44)
AST: 21 U/L (ref 15–41)
Albumin: 3.8 g/dL (ref 3.5–5.0)
Alkaline Phosphatase: 132 U/L — ABNORMAL HIGH (ref 38–126)
Anion gap: 9 (ref 5–15)
BUN: 18 mg/dL (ref 6–20)
CO2: 26 mmol/L (ref 22–32)
Calcium: 9.5 mg/dL (ref 8.9–10.3)
Chloride: 107 mmol/L (ref 98–111)
Creatinine, Ser: 0.81 mg/dL (ref 0.44–1.00)
GFR, Estimated: >60 mL/min (ref >60)
Glucose, Bld: 174 mg/dL — ABNORMAL HIGH (ref 70–99)
Potassium: 3.2 mmol/L — ABNORMAL LOW (ref 3.5–5.1)
Sodium: 142 mmol/L (ref 135–145)
Total Bilirubin: 0.3 mg/dL (ref 0.3–1.2)
Total Protein: 7.7 g/dL (ref 6.5–8.1)

## 2021-03-14 LAB — CBC WITH DIFFERENTIAL/PLATELET
Abs Immature Granulocytes: 0.03 10*3/uL (ref 0.00–0.07)
Basophils Absolute: 0.1 10*3/uL (ref 0.0–0.1)
Basophils Relative: 1 %
Eosinophils Absolute: 0.3 10*3/uL (ref 0.0–0.5)
Eosinophils Relative: 3 %
HCT: 42.2 % (ref 36.0–46.0)
Hemoglobin: 14.2 g/dL (ref 12.0–15.0)
Immature Granulocytes: 0 %
Lymphocytes Relative: 28 %
Lymphs Abs: 2.8 10*3/uL (ref 0.7–4.0)
MCH: 30.2 pg (ref 26.0–34.0)
MCHC: 33.6 g/dL (ref 30.0–36.0)
MCV: 89.8 fL (ref 80.0–100.0)
Monocytes Absolute: 0.9 10*3/uL (ref 0.1–1.0)
Monocytes Relative: 9 %
Neutro Abs: 5.8 10*3/uL (ref 1.7–7.7)
Neutrophils Relative %: 59 %
Platelets: 194 10*3/uL (ref 150–400)
RBC: 4.7 MIL/uL (ref 3.87–5.11)
RDW: 13.9 % (ref 11.5–15.5)
WBC: 9.8 10*3/uL (ref 4.0–10.5)
nRBC: 0 % (ref 0.0–0.2)

## 2021-03-14 LAB — RESEARCH LABS

## 2021-03-14 MED ORDER — POTASSIUM CHLORIDE CRYS ER 20 MEQ PO TBCR
20.0000 meq | EXTENDED_RELEASE_TABLET | Freq: Two times a day (BID) | ORAL | 4 refills | Status: DC
  Filled 2021-03-14: qty 90, 45d supply, fill #0
  Filled 2021-05-16: qty 90, 45d supply, fill #1
  Filled 2021-07-21: qty 90, 45d supply, fill #2
  Filled 2021-09-07: qty 90, 45d supply, fill #3
  Filled 2021-11-08: qty 90, 45d supply, fill #4

## 2021-03-14 NOTE — Research (Signed)
[facility] [id number] BWEL - Month 60 visit  Patient into clinic today unaccompanied for routine follow-up visit.   Follow-Up Questionnaire: Completed independently by patient upon arrival to clinic. Collected and checked for completeness and accuracy.   Disease status/outcome assessment - See MD note.  Weight and waist and hip circumference measurements - Obtained per protocol and following the instructions in the BWEL Weight and Height Protocol document. Waist and hip measured in centimeters.   Patient understands that she will continue in the follow-up phase including ongoing measurements and H&Ps to occur annually and agrees to this plan. Thanked patient for her study participation. Encouraged her to call research nurse if any questions/concerns prior to next visit. She verbalized understanding.   Foye Spurling, BSN, RN Clinical Research Nurse 03/14/2021 11:00 AM

## 2021-03-14 NOTE — Research (Signed)
03/14/2021   AFT-05 [facility] - Follow-Up Phase I Month 60  Patient into clinic today unaccompanied for routine follow-up visit.  Research Labs- Drawn per protocol.   Anti-cancer medications - Patient states she is continuing to take anastrozole without interruption; she is not on any new anti-cancer medications.   Serious adverse events - Patient denies any new, serious health problems or hospitalizations since her last visit.    Disease monitoring - See MD note.  COVID-19 - She denies any COVID-19 infection since last visit.  Plan- Dr. Jana Hakim instructed for patient to stop Anastrozole now since she has completed 5 years.   Patient will go into Follow Up Phase 2 on study with annual follow-up.  This will be done in conjunction with the BWEL study annual visits. Patient agreed with this plan. Thanked patient for her participation in this research study. Encouraged her to call research nurse if any questions/concerns prior to next visit. She verbalized understanding.  Foye Spurling, BSN, RN Clinical Research Nurse 03/14/2021 11:00 AM

## 2021-03-14 NOTE — Progress Notes (Signed)
Margaret [name]  [phone] [phone]   ID: Margaret [name] DOB: 12/10/1960  MR#: [id number]  OVZ#:[id number]  Patient Care Team: Hoyt Koch, MD as PCP - General (Internal Medicine) Jovita Kussmaul, MD as Consulting Physician (General Surgery) Dereck Agerton, Virgie Dad, MD as Consulting Physician (Oncology) Gery Pray, MD as Consulting Physician (Radiation Oncology) Mauro Kaufmann, RN as Registered Nurse Rockwell Germany, RN as Registered Nurse Rosemarie Ax, MD as Consulting Physician (Family Medicine) Charlton Haws, [facility] as Pharmacist (Pharmacist) OTHER MD:   CHIEF COMPLAINT:  estrogen receptor positive breast cancer; (s/p bilateral mastectomies)  CURRENT TREATMENT: completing 5 years of anastrozole  INTERVAL HISTORY: Margaret [name] returns today for follow-up of her estrogen receptor positive breast cancer.   She is ready to come off anastrozole.  She has had significant problems with hot flashes despite being on Effexor. Vaginal dryness is not a major issue.  She is status post bilateral mastectomies, so does not require annual mammography.  I am writing her a prescription for additional prostheses and bras as needed.   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  She has aches in her knees and chest which are chronic.  She still has peripheral neuropathy particularly in her toes and this bothers her most at night.  She does have compression stockings which she uses.  Aside from being on her feet at work she does not regularly exercise.  A detailed review of systems today was otherwise stable.   COVID 19 VACCINATION STATUS: [place] x3, most recently 07/2020   BREAST CANCER HISTORY: From the original intake note:  Margaret [name]'s primary care physician retired sometime ago and her health maintenance has not been up-to-date. She has been receiving medical care through the emergency room and was  seen in March 2015 following a head injury, then in August 2015 for lancing of an abscess. In December 2015 she noticed that her left breast looked a little bit smaller than her right. She brought this to the attention of friends and family over the next several months but the general feeling was that most people are not perfectly symmetrical. By the summer of this year she noticed that her nipple on the left was "going in". More recently she started developing pain in the left breast. She was evaluated for this in the emergency room 04/09/2015 at which time a left breast exam showed a deformed left breast with a large hard mass encompassing most of the breast, with nipple retraction. There was no nipple discharge or bleeding and no palpable adenopathy.  The patient was referred to [facility] and on 04/13/2015 she underwent bilateral diagnostic mammography with tomosynthesis as well as bilateral breast ultrasound. The breast density was category B. In the right breast at the 11:00 position there was a 1.3 cm irregular mass which by ultrasonography measured 1.3 cm.--In the left breast there was a 4 cm irregular mass at the 2:00 position associated with nipple retraction. There was a second, 1.7 cm area of architectural distortion at the 9:00 position. Both were palpable. By ultrasonography, the 4 cm irregular mass was noted, with a second mass measuring 2.5 cm by ultrasonography. Both axillae were benign.  On 04/19/2015 the patient underwent right breast upper outer quadrant biopsy, showing ([id number]) and invasive ductal carcinoma, grade 1, estrogen receptor 90% positive, progesterone receptor negative, with an MIB-1 of 5%, and no HER-2 amplification, the signals ratio being 1.26 and  the number per cell 2.20.  On the same day, she underwent biopsy of the 2 left breast masses in question as well as a suspicious left axillary lymph node. All 3 biopsies showed invasive ductal carcinoma, grade 2, estrogen receptor  80-100% positive, progesterone receptor 80-100% positive, with the MIB-1 between 5 and 10%, and no HER-2 amplification, the signals ratio being between 1.05 and 1.13, and the number per cell between 2.10 and 2.25.  The patient's subsequent history is as detailed below   PAST MEDICAL HISTORY: Past Medical History:  Diagnosis Date   Allergy    Anxiety    Arthritis    Bilateral breast cancer ([place])    Breast cancer ([place])    Breast cancer of upper-outer quadrant of left female breast ([place]) 04/21/2015   Depression    Diabetes mellitus without complication (HCC)    GERD (gastroesophageal reflux disease)    Hypertension     PAST SURGICAL HISTORY: Past Surgical History:  Procedure Laterality Date   ABDOMINAL HYSTERECTOMY  1990   MASTECTOMY MODIFIED RADICAL Left 05/09/2015   MASTECTOMY MODIFIED RADICAL Left 05/09/2015   Procedure: LEFT MODIFIED RADICAL MASTETCTOMY;  Surgeon: Autumn Messing III, MD;  Location: [place];  Service: General;  Laterality: Left;   MASTECTOMY W/ SENTINEL NODE BIOPSY Right    MASTECTOMY W/ SENTINEL NODE BIOPSY Right 05/09/2015   Procedure: RIGHT MASTECTOMY WITH RIGHT AXILLARY SENTINEL LYMPH NODE BIOPSY;  Surgeon: Autumn Messing III, MD;  Location: [place];  Service: General;  Laterality: Right;   PORTACATH PLACEMENT Right 09/01/2015   Procedure: INSERTION PORT-A-CATH;  Surgeon: Autumn Messing III, MD;  Location: [place];  Service: General;  Laterality: Right;   TUBAL LIGATION  1984    FAMILY HISTORY Family History  Problem Relation Age of Onset   Hypertension Mother    Diabetes Mother    Colon cancer Neg Hx    Esophageal cancer Neg Hx    Rectal cancer Neg Hx    Stomach cancer Neg Hx     the patient has little information on her father. Her mother is living, currently age 24. She has one brother, 2 sisters. There is no history of breast or ovarian cancer in the family to her knowledge.    GYNECOLOGIC HISTORY:  No LMP recorded. Patient has had a  hysterectomy.  menarche age 28, first live birth age 46. She is GX P4. She underwent a total abdominal hysterectomy with bilateral salpingo-oophorectomy at age 41. She did not take hormone replacement. She never used oral contraceptives.    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 She previously worked the same position at [employer] for 25+ years. She is single and lives alone  The patient's daughter, [name], works as a Programmer, applications, as does her daughter [name]. Daughter [name] works at [employer], as a call. All 3 live in [place]. The patient's son Marc Morgans Leppert's lives in [place]. He prepares sets for shows The patient has 4 grandchildren, no great grandchildren. She attends a [employer] in [place].    ADVANCED DIRECTIVES: Not in place. At the initial clinic visit the patient was given the appropriate forms to complete and notarize at her discretion. The patient intends to name her daughter Sampson Si as healthcare power of attorney. She can be reached at [phone].   HEALTH MAINTENANCE: Social History   Tobacco Use   Smoking  status: Former    Packs/day: 0.10    Years: 28.00    Pack years: 2.80    Types: Cigarettes    Quit date: 09/02/2018    Years since quitting: 2.5   Smokeless tobacco: Never  Vaping Use   Vaping Use: Never used  Substance Use Topics   Alcohol use: Yes    Alcohol/week: 0.0 standard drinks    Comment: Socially   Drug use: No     Colonoscopy: Never  PAP: Status post hysterectomy  Bone density: Never  Lipid panel:  Allergies  Allergen Reactions   Banana Hives    Tongue itching   Latex Itching and Other (See Comments)    burning   Peanuts [Peanut Oil] Hives    Patient is allergic to all tree nuts   Wheat Bran Hives    Current Outpatient Medications  Medication Sig Dispense Refill   potassium chloride SA  (KLOR-CON) 20 MEQ tablet Take 1 tablet by mouth 2 times daily. 90 tablet 4   amLODipine (NORVASC) 5 MG tablet TAKE 1 TABLET BY MOUTH DAILY. 90 tablet 6   atorvastatin (LIPITOR) 20 MG tablet TAKE 1 TABLET BY MOUTH ONCE A DAY 90 tablet 3   baclofen (LIORESAL) 20 MG tablet TAKE 1 TABLET BY MOUTH 3 TIMES DAILY 90 tablet 0   Blood Glucose Monitoring Suppl (BLOOD GLUCOSE MONITOR SYSTEM) w/Device KIT Use to test blood sugar 3 times a day 1 kit 0   busPIRone (BUSPAR) 5 MG tablet TAKE 1 TABLET BY MOUTH ONCE A DAY AS NEEDED FOR ANXIETY 30 tablet 2   calcium-vitamin D (OSCAL WITH D) 500-200 MG-UNIT tablet Take 1 tablet by mouth 2 (two) times daily. 60 tablet 0   diphenhydrAMINE (BENADRYL) 25 mg capsule Take 1 capsule (25 mg total) by mouth every 6 (six) hours as needed. (Patient taking differently: Take 25 mg by mouth every 6 (six) hours as needed for allergies.) 100 capsule 3   empagliflozin (JARDIANCE) 25 MG TABS tablet TAKE 1 TABLET BY MOUTH ONCE DAILY BEFORE BREAKFAST 90 tablet 3   gabapentin (NEURONTIN) 300 MG capsule TAKE 2 CAPSULES BY MOUTH 3 TIMES DAILY 180 capsule 2   glimepiride (AMARYL) 2 MG tablet TAKE 1 TABLET BY MOUTH DAILY BEFORE BREAKFAST 90 tablet 1   glucose blood test strip Use to test blood sugar 3 times a day 100 each 0   ibuprofen (ADVIL) 800 MG tablet TAKE 1 TABLET BY MOUTH EVERY 8 HOURS AS NEEDED 90 tablet 5   Lancets (ONETOUCH DELICA PLUS HQIONG29B) MISC Use to test blood sugar 3 times a day 100 each 0   Multiple Vitamins-Minerals (CENTRUM SILVER 50+WOMEN PO) Take 1 tablet by mouth daily.     Olmesartan-amLODIPine-HCTZ 20-5-12.5 MG TABS TAKE 1 TABLET BY MOUTH ONCE A DAY. REPLACES AMLODIPINE AND LOSARTAN/HCTZ 90 tablet 4   pantoprazole (PROTONIX) 40 MG tablet TAKE 1 TABLET BY MOUTH ONCE DAILY 30 tablet 2   traMADol (ULTRAM) 50 MG tablet TAKE 1 TABLET BY MOUTH DAILY AS NEEDED 30 tablet 5   traZODone (DESYREL) 50 MG tablet TAKE 1 TABLET BY MOUTH AT BEDTIME. MAY INCREASE TO 2 TABLETS  AFTER FEW DAYS IF NEEDED 60 tablet 6   valACYclovir (VALTREX) 1000 MG tablet TAKE 1 TABLET BY MOUTH TWICE DAILY 90 tablet 4   venlafaxine XR (EFFEXOR-XR) 150 MG 24 hr capsule TAKE 1 CAPSULE BY MOUTH ONCE DAILY WITH BREAKFAST 90 capsule 3   No current facility-administered medications for this visit.     OBJECTIVE:  African-American woman who appears stated age  60:   03/14/21 1201  BP: 139/89  Pulse: 85  Resp: 18  Temp: (!) 97.5 F (36.4 C)  SpO2: 97%   Wt Readings from Last 3 Encounters:  03/14/21 250 lb 4.8 oz (113.5 kg)  12/13/20 248 lb 12.8 oz (112.9 kg)  08/16/20 269 lb (122 kg)   Body mass index is 36.96 kg/m.    ECOG FS:1 - Symptomatic but completely ambulatory  Sclerae unicteric, EOMs intact Wearing a mask No cervical or supraclavicular adenopathy Lungs no rales or rhonchi Heart regular rate and rhythm Abd soft, nontender, positive bowel sounds MSK no focal spinal tenderness, no upper extremity lymphedema Neuro: nonfocal, well oriented, appropriate affect Breasts: Status post bilateral mastectomies with no evidence of chest wall recurrence.  Both axillae are benign.   LAB RESULTS:  CMP     Component Value Date/Time   NA 142 03/14/2021 1140   NA 142 07/24/2017 1407   K 3.2 (L) 03/14/2021 1140   K 3.3 (L) 07/24/2017 1407   CL 107 03/14/2021 1140   CO2 26 03/14/2021 1140   CO2 26 07/24/2017 1407   GLUCOSE 174 (H) 03/14/2021 1140   GLUCOSE 110 07/24/2017 1407   BUN 18 03/14/2021 1140   BUN 13.4 07/24/2017 1407   CREATININE 0.81 03/14/2021 1140   CREATININE 0.83 03/10/2020 0919   CREATININE 1.0 07/24/2017 1407   CALCIUM 9.5 03/14/2021 1140   CALCIUM 9.6 07/24/2017 1407   PROT 7.7 03/14/2021 1140   PROT 8.5 (H) 07/24/2017 1407   ALBUMIN 3.8 03/14/2021 1140   ALBUMIN 3.9 07/24/2017 1407   AST 21 03/14/2021 1140   AST 18 03/10/2020 0919   AST 23 07/24/2017 1407   ALT 32 03/14/2021 1140   ALT 30 03/10/2020 0919   ALT 28 07/24/2017 1407    ALKPHOS 132 (H) 03/14/2021 1140   ALKPHOS 84 07/24/2017 1407   BILITOT 0.3 03/14/2021 1140   BILITOT <0.2 (L) 03/10/2020 0919   BILITOT 0.30 07/24/2017 1407   GFRNONAA >60 03/14/2021 1140   GFRNONAA >60 03/10/2020 0919   GFRAA >60 03/10/2020 0919    INo results found for: SPEP, UPEP  Lab Results  Component Value Date   WBC 9.8 03/14/2021   NEUTROABS 5.8 03/14/2021   HGB 14.2 03/14/2021   HCT 42.2 03/14/2021   MCV 89.8 03/14/2021   PLT 194 03/14/2021      Chemistry      Component Value Date/Time   NA 142 03/14/2021 1140   NA 142 07/24/2017 1407   K 3.2 (L) 03/14/2021 1140   K 3.3 (L) 07/24/2017 1407   CL 107 03/14/2021 1140   CO2 26 03/14/2021 1140   CO2 26 07/24/2017 1407   BUN 18 03/14/2021 1140   BUN 13.4 07/24/2017 1407   CREATININE 0.81 03/14/2021 1140   CREATININE 0.83 03/10/2020 0919   CREATININE 1.0 07/24/2017 1407   GLU 157 (H) 08/17/2016 0818      Component Value Date/Time   CALCIUM 9.5 03/14/2021 1140   CALCIUM 9.6 07/24/2017 1407   ALKPHOS 132 (H) 03/14/2021 1140   ALKPHOS 84 07/24/2017 1407   AST 21 03/14/2021 1140   AST 18 03/10/2020 0919   AST 23 07/24/2017 1407   ALT 32 03/14/2021 1140   ALT 30 03/10/2020 0919   ALT 28 07/24/2017 1407   BILITOT 0.3 03/14/2021 1140   BILITOT <0.2 (L) 03/10/2020 0919   BILITOT 0.30 07/24/2017 1407       No  results found for: LABCA2  No components found for: LABCA125  No results for input(s): INR in the last 168 hours.  Urinalysis    Component Value Date/Time   COLORURINE YELLOW 06/29/2015 1030   APPEARANCEUR CLOUDY (A) 06/29/2015 1030   LABSPEC 1.019 06/29/2015 1030   PHURINE 7.0 06/29/2015 1030   GLUCOSEU NEGATIVE 06/29/2015 1030   HGBUR TRACE (A) 06/29/2015 1030   BILIRUBINUR NEGATIVE 06/29/2015 1030   KETONESUR NEGATIVE 06/29/2015 1030   PROTEINUR NEGATIVE 06/29/2015 1030   NITRITE NEGATIVE 06/29/2015 1030   LEUKOCYTESUR MODERATE (A) 06/29/2015 1030    STUDIES: No results  found.   ASSESSMENT: 60 y.o. [place] woman with synchronous breast cancers, as follows  (a) status post right breast upper outer quadrant biopsy 04/19/2015 for a clinical pT1c N0, stage IA invasive ductal carcinoma, grade 1, estrogen receptor positive, progesterone receptor negative, with an MIB-1 of 5%, and no HER-2 amplification  (b) status post left breast upper outer quadrant biopsy 2 and left axillary lymph node biopsy 04/19/2015 for a clinical T3 N1, stage IIIA invasive ductal carcinoma, grade 1 or 2, estrogen receptor and progesterone receptor positive, HER-2 negative, with an MIB-1 between 5 and 10%  (1) status post bilateral mastectomies 05/09/2015, showing:   (a) on the right side, a pT2 pN0, stage IIA invasive ductal carcinoma, with negative margins and repeat HER-2 again negative    (b) on the left, a pT3 pN2, stage IIIA invasive ductal carcinoma, grade 2, with negative margins, and repeat HER-2 again negative  (2) Mammaprint from the left-sided tumor returned "luminal type, low risk", predicting a small benefit from chemotherapy with the important caveat that N2 disease was not included in the MINDACT study--patient opted for chemotherapy  (3) Oncotype from the right-sided tumor showed a score of 12, predicting an 8% risk of recurrence outside the breast within the next 10 years, if the patient's only systemic therapy was tamoxifen for 5 years. It also predicted no significant benefit from chemotherapy  (4) postmastectomy radiation to the left chest wall completed 08/15/2015  (5) adjuvant chemotherapy consisting of cyclophosphamide and docetaxel x4 completed on 11/21/15.  (5) anastrozole started 02/08/2016, completing 5 years August 2022  (6) left upper extremity lymphedema  (a) negative Doppler study 11/02/2015  (7) enrolled in B-WELL study 02/08/2016, follow-up for 10 years planned  (8) participating in [place] trial as of August 2017, 10-year follow-up planned  (a)  randomized to palbociclib, starting October 2017  (b) completed 2 years of palbociclib October 2019, with no dose reductions necessary   PLAN: Makenleigh is now close to 6 years out from definitive surgery for her breast cancer with no evidence of disease recurrence.  This is very favorable.  She is going off anastrozole.  I anticipate she will start feeling a little bit better and with fewer arthralgias and myalgias within a couple of months.  Normally we would discharge her from follow-up at this point but the [place] trial requires 5 years more of yearly visits and so she will return to see [place] in 1 year.  She knows to call for any other issue that may develop before then.  Total encounter time 30 minutes.*   [place] Nonaka, Virgie Dad, MD  03/14/21 9:39 PM Medical Oncology and Hematology [facility] [place], [place] [address] Tel. [phone]    Fax. [phone]   I, Wilburn Mylar, am acting as scribe for Dr. Virgie Dad. Cinthia Rodden.  Lindie Spruce MD, have reviewed the above documentation  for accuracy and completeness, and I agree with the above.   *Total Encounter Time as defined by the Centers for Medicare and Medicaid Services includes, in addition to the face-to-face time of a patient visit (documented in the note above) non-face-to-face time: obtaining and reviewing outside history, ordering and reviewing medications, tests or procedures, care coordination (communications with other health care professionals or caregivers) and documentation in the medical record.

## 2021-03-15 ENCOUNTER — Other Ambulatory Visit (HOSPITAL_COMMUNITY): Payer: Self-pay

## 2021-03-15 ENCOUNTER — Other Ambulatory Visit: Payer: Self-pay | Admitting: Oncology

## 2021-03-15 NOTE — Progress Notes (Signed)
[place]  Telephone:([phone]) [phone] Fax:(336) [phone]   ID: Margaret [name] DOB: 05/20/1961  MR#: [id number]  QRF#:[id number]  Patient Care Team: Hoyt Koch, MD as PCP - General (Internal Medicine) Jovita Kussmaul, MD as Consulting Physician (General Surgery) Day Deery, Virgie Dad, MD as Consulting Physician (Oncology) Gery Pray, MD as Consulting Physician (Radiation Oncology) Mauro Kaufmann, RN as Registered Nurse Rockwell Germany, RN as Registered Nurse Rosemarie Ax, MD as Consulting Physician (Family Medicine) Charlton Haws, [facility] as Pharmacist (Pharmacist) OTHER MD:  NB: this is a corrected note  CHIEF COMPLAINT:  estrogen receptor positive breast cancer; (s/p bilateral mastectomies)  CURRENT TREATMENT: completing 5 years of anastrozole  INTERVAL HISTORY: Margaret [name] returns today for follow-up of her estrogen receptor positive breast cancer.   She is ready to come off anastrozole.  She has had significant problems with hot flashes despite being on Effexor. Vaginal dryness is not a major issue.  She is status post bilateral mastectomies, so does not require annual mammography.  I am writing her a prescription for additional prostheses and bras as needed.   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  She has aches in her knees and chest which are chronic.  She still has peripheral neuropathy particularly in her toes and this bothers her most at night.  She does have compression stockings which she uses.  Aside from being on her feet at work she does not regularly exercise.  A detailed review of systems today was otherwise stable.   COVID 19 VACCINATION STATUS: [place] x3, most recently 07/2020   BREAST CANCER HISTORY: From the original intake note:  Margaret [name]'s primary care physician retired sometime ago and her health maintenance has not been up-to-date. She has been receiving medical care  through the emergency room and was seen in March 2015 following a head injury, then in August 2015 for lancing of an abscess. In December 2015 she noticed that her left breast looked a little bit smaller than her right. She brought this to the attention of friends and family over the next several months but the general feeling was that most people are not perfectly symmetrical. By the summer of this year she noticed that her nipple on the left was "going in". More recently she started developing pain in the left breast. She was evaluated for this in the emergency room 04/09/2015 at which time a left breast exam showed a deformed left breast with a large hard mass encompassing most of the breast, with nipple retraction. There was no nipple discharge or bleeding and no palpable adenopathy.  The patient was referred to [facility] and on 04/13/2015 she underwent bilateral diagnostic mammography with tomosynthesis as well as bilateral breast ultrasound. The breast density was category B. In the right breast at the 11:00 position there was a 1.3 cm irregular mass which by ultrasonography measured 1.3 cm.--In the left breast there was a 4 cm irregular mass at the 2:00 position associated with nipple retraction. There was a second, 1.7 cm area of architectural distortion at the 9:00 position. Both were palpable. By ultrasonography, the 4 cm irregular mass was noted, with a second mass measuring 2.5 cm by ultrasonography. Both axillae were benign.  On 04/19/2015 the patient underwent right breast upper outer quadrant biopsy, showing ([id number]) and invasive ductal carcinoma, grade 1, estrogen receptor 90% positive, progesterone receptor negative, with an MIB-1 of 5%, and no HER-2 amplification,  the signals ratio being 1.26 and the number per cell 2.20.  On the same day, she underwent biopsy of the 2 left breast masses in question as well as a suspicious left axillary lymph node. All 3 biopsies showed invasive ductal  carcinoma, grade 2, estrogen receptor 80-100% positive, progesterone receptor 80-100% positive, with the MIB-1 between 5 and 10%, and no HER-2 amplification, the signals ratio being between 1.05 and 1.13, and the number per cell between 2.10 and 2.25.  The patient's subsequent history is as detailed below   PAST MEDICAL HISTORY: Past Medical History:  Diagnosis Date   Allergy    Anxiety    Arthritis    Bilateral breast cancer ([place])    Breast cancer ([place])    Breast cancer of upper-outer quadrant of left female breast ([place]) 04/21/2015   Depression    Diabetes mellitus without complication (HCC)    GERD (gastroesophageal reflux disease)    Hypertension     PAST SURGICAL HISTORY: Past Surgical History:  Procedure Laterality Date   ABDOMINAL HYSTERECTOMY  1990   MASTECTOMY MODIFIED RADICAL Left 05/09/2015   MASTECTOMY MODIFIED RADICAL Left 05/09/2015   Procedure: LEFT MODIFIED RADICAL MASTETCTOMY;  Surgeon: Autumn Messing III, MD;  Location: [place];  Service: General;  Laterality: Left;   MASTECTOMY W/ SENTINEL NODE BIOPSY Right    MASTECTOMY W/ SENTINEL NODE BIOPSY Right 05/09/2015   Procedure: RIGHT MASTECTOMY WITH RIGHT AXILLARY SENTINEL LYMPH NODE BIOPSY;  Surgeon: Autumn Messing III, MD;  Location: [facility];  Service: General;  Laterality: Right;   PORTACATH PLACEMENT Right 09/01/2015   Procedure: INSERTION PORT-A-CATH;  Surgeon: Autumn Messing III, MD;  Location: [facility];  Service: General;  Laterality: Right;   TUBAL LIGATION  1984    FAMILY HISTORY Family History  Problem Relation Age of Onset   Hypertension Mother    Diabetes Mother    Colon cancer Neg Hx    Esophageal cancer Neg Hx    Rectal cancer Neg Hx    Stomach cancer Neg Hx     the patient has little information on her father. Her mother is living, currently age 70. She has one brother, 2 sisters. There is no history of breast or ovarian cancer in the family to her knowledge.    GYNECOLOGIC HISTORY:  No LMP  recorded. Patient has had a hysterectomy.  menarche age 45, first live birth age 58. She is GX P4. She underwent a total abdominal hysterectomy with bilateral salpingo-oophorectomy at age 59. She did not take hormone replacement. She never used oral contraceptives.    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 She previously worked the same position at [employer] for 25+ years. She is single and lives alone  The patient's daughter, [name], works as a Programmer, applications, as does her daughter [name]. Daughter [name] works at [employer], as a call. All 3 live in [place]. The patient's son Marc Morgans Darin's lives in [place]. He prepares sets for shows The patient has 4 grandchildren, no great grandchildren. She attends a [employer] in [place].    ADVANCED DIRECTIVES: Not in place. At the initial clinic visit the patient was given the appropriate forms to complete and notarize at her discretion. The patient intends to name her daughter Sampson Si as healthcare power of attorney. She can be reached at [phone].   HEALTH MAINTENANCE: Social History  Tobacco Use   Smoking status: Former    Packs/day: 0.10    Years: 28.00    Pack years: 2.80    Types: Cigarettes    Quit date: 09/02/2018    Years since quitting: 2.5   Smokeless tobacco: Never  Vaping Use   Vaping Use: Never used  Substance Use Topics   Alcohol use: Yes    Alcohol/week: 0.0 standard drinks    Comment: Socially   Drug use: No     Colonoscopy: Never  PAP: Status post hysterectomy  Bone density: Never  Lipid panel:  Allergies  Allergen Reactions   Banana Hives    Tongue itching   Latex Itching and Other (See Comments)    burning   Peanuts [Peanut Oil] Hives    Patient is allergic to all tree nuts   Wheat Bran Hives    Current Outpatient Medications  Medication Sig Dispense Refill    amLODipine (NORVASC) 5 MG tablet TAKE 1 TABLET BY MOUTH DAILY. 90 tablet 6   atorvastatin (LIPITOR) 20 MG tablet TAKE 1 TABLET BY MOUTH ONCE A DAY 90 tablet 3   baclofen (LIORESAL) 20 MG tablet TAKE 1 TABLET BY MOUTH 3 TIMES DAILY 90 tablet 0   Blood Glucose Monitoring Suppl (BLOOD GLUCOSE MONITOR SYSTEM) w/Device KIT Use to test blood sugar 3 times a day 1 kit 0   busPIRone (BUSPAR) 5 MG tablet TAKE 1 TABLET BY MOUTH ONCE A DAY AS NEEDED FOR ANXIETY 30 tablet 2   calcium-vitamin D (OSCAL WITH D) 500-200 MG-UNIT tablet Take 1 tablet by mouth 2 (two) times daily. 60 tablet 0   diphenhydrAMINE (BENADRYL) 25 mg capsule Take 1 capsule (25 mg total) by mouth every 6 (six) hours as needed. (Patient taking differently: Take 25 mg by mouth every 6 (six) hours as needed for allergies.) 100 capsule 3   empagliflozin (JARDIANCE) 25 MG TABS tablet TAKE 1 TABLET BY MOUTH ONCE DAILY BEFORE BREAKFAST 90 tablet 3   gabapentin (NEURONTIN) 300 MG capsule TAKE 2 CAPSULES BY MOUTH 3 TIMES DAILY 180 capsule 2   glimepiride (AMARYL) 2 MG tablet TAKE 1 TABLET BY MOUTH DAILY BEFORE BREAKFAST 90 tablet 1   glucose blood test strip Use to test blood sugar 3 times a day 100 each 0   ibuprofen (ADVIL) 800 MG tablet TAKE 1 TABLET BY MOUTH EVERY 8 HOURS AS NEEDED 90 tablet 5   Lancets (ONETOUCH DELICA PLUS OQHUTM54Y) MISC Use to test blood sugar 3 times a day 100 each 0   Multiple Vitamins-Minerals (CENTRUM SILVER 50+WOMEN PO) Take 1 tablet by mouth daily.     Olmesartan-amLODIPine-HCTZ 20-5-12.5 MG TABS TAKE 1 TABLET BY MOUTH ONCE A DAY. REPLACES AMLODIPINE AND LOSARTAN/HCTZ 90 tablet 4   pantoprazole (PROTONIX) 40 MG tablet TAKE 1 TABLET BY MOUTH ONCE DAILY 30 tablet 2   potassium chloride SA (KLOR-CON) 20 MEQ tablet Take 1 tablet by mouth 2 times daily. 90 tablet 4   traMADol (ULTRAM) 50 MG tablet TAKE 1 TABLET BY MOUTH DAILY AS NEEDED 30 tablet 5   traZODone (DESYREL) 50 MG tablet TAKE 1 TABLET BY MOUTH AT BEDTIME. MAY  INCREASE TO 2 TABLETS AFTER FEW DAYS IF NEEDED 60 tablet 6   valACYclovir (VALTREX) 1000 MG tablet TAKE 1 TABLET BY MOUTH TWICE DAILY 90 tablet 4   venlafaxine XR (EFFEXOR-XR) 150 MG 24 hr capsule TAKE 1 CAPSULE BY MOUTH ONCE DAILY WITH BREAKFAST 90 capsule 3   No current facility-administered medications for this visit.  OBJECTIVE:  African-American woman who appears stated age  There were no vitals filed for this visit.  Wt Readings from Last 3 Encounters:  03/14/21 249 lb 8 oz (113.2 kg)  03/14/21 250 lb 4.8 oz (113.5 kg)  12/13/20 248 lb 12.8 oz (112.9 kg)   There is no height or weight on file to calculate BMI.    ECOG FS:1 - Symptomatic but completely ambulatory  Sclerae unicteric, EOMs intact Wearing a mask No cervical or supraclavicular adenopathy Lungs no rales or rhonchi Heart regular rate and rhythm Abd soft, nontender, positive bowel sounds MSK no focal spinal tenderness, no upper extremity lymphedema Neuro: nonfocal, well oriented, appropriate affect Breasts: Status post bilateral mastectomies with no evidence of chest wall recurrence.  Both axillae are benign.   LAB RESULTS:  CMP     Component Value Date/Time   NA 142 03/14/2021 1140   NA 142 07/24/2017 1407   K 3.2 (L) 03/14/2021 1140   K 3.3 (L) 07/24/2017 1407   CL 107 03/14/2021 1140   CO2 26 03/14/2021 1140   CO2 26 07/24/2017 1407   GLUCOSE 174 (H) 03/14/2021 1140   GLUCOSE 110 07/24/2017 1407   BUN 18 03/14/2021 1140   BUN 13.4 07/24/2017 1407   CREATININE 0.81 03/14/2021 1140   CREATININE 0.83 03/10/2020 0919   CREATININE 1.0 07/24/2017 1407   CALCIUM 9.5 03/14/2021 1140   CALCIUM 9.6 07/24/2017 1407   PROT 7.7 03/14/2021 1140   PROT 8.5 (H) 07/24/2017 1407   ALBUMIN 3.8 03/14/2021 1140   ALBUMIN 3.9 07/24/2017 1407   AST 21 03/14/2021 1140   AST 18 03/10/2020 0919   AST 23 07/24/2017 1407   ALT 32 03/14/2021 1140   ALT 30 03/10/2020 0919   ALT 28 07/24/2017 1407   ALKPHOS 132 (H)  03/14/2021 1140   ALKPHOS 84 07/24/2017 1407   BILITOT 0.3 03/14/2021 1140   BILITOT <0.2 (L) 03/10/2020 0919   BILITOT 0.30 07/24/2017 1407   GFRNONAA >60 03/14/2021 1140   GFRNONAA >60 03/10/2020 0919   GFRAA >60 03/10/2020 0919    INo results found for: SPEP, UPEP  Lab Results  Component Value Date   WBC 9.8 03/14/2021   NEUTROABS 5.8 03/14/2021   HGB 14.2 03/14/2021   HCT 42.2 03/14/2021   MCV 89.8 03/14/2021   PLT 194 03/14/2021      Chemistry      Component Value Date/Time   NA 142 03/14/2021 1140   NA 142 07/24/2017 1407   K 3.2 (L) 03/14/2021 1140   K 3.3 (L) 07/24/2017 1407   CL 107 03/14/2021 1140   CO2 26 03/14/2021 1140   CO2 26 07/24/2017 1407   BUN 18 03/14/2021 1140   BUN 13.4 07/24/2017 1407   CREATININE 0.81 03/14/2021 1140   CREATININE 0.83 03/10/2020 0919   CREATININE 1.0 07/24/2017 1407   GLU 157 (H) 08/17/2016 0818      Component Value Date/Time   CALCIUM 9.5 03/14/2021 1140   CALCIUM 9.6 07/24/2017 1407   ALKPHOS 132 (H) 03/14/2021 1140   ALKPHOS 84 07/24/2017 1407   AST 21 03/14/2021 1140   AST 18 03/10/2020 0919   AST 23 07/24/2017 1407   ALT 32 03/14/2021 1140   ALT 30 03/10/2020 0919   ALT 28 07/24/2017 1407   BILITOT 0.3 03/14/2021 1140   BILITOT <0.2 (L) 03/10/2020 0919   BILITOT 0.30 07/24/2017 1407       No results found for: LABCA2  No components found  for: [id number]  No results for input(s): INR in the last 168 hours.  Urinalysis    Component Value Date/Time   COLORURINE YELLOW 06/29/2015 1030   APPEARANCEUR CLOUDY (A) 06/29/2015 1030   LABSPEC 1.019 06/29/2015 1030   PHURINE 7.0 06/29/2015 1030   GLUCOSEU NEGATIVE 06/29/2015 1030   HGBUR TRACE (A) 06/29/2015 1030   BILIRUBINUR NEGATIVE 06/29/2015 1030   KETONESUR NEGATIVE 06/29/2015 1030   PROTEINUR NEGATIVE 06/29/2015 1030   NITRITE NEGATIVE 06/29/2015 1030   LEUKOCYTESUR MODERATE (A) 06/29/2015 1030    STUDIES: No results found.   ASSESSMENT: 60 y.o.  [place] woman with synchronous breast cancers, as follows  (a) status post right breast upper outer quadrant biopsy 04/19/2015 for a clinical pT1c N0, stage IA invasive ductal carcinoma, grade 1, estrogen receptor positive, progesterone receptor negative, with an MIB-1 of 5%, and no HER-2 amplification  (b) status post left breast upper outer quadrant biopsy 2 and left axillary lymph node biopsy 04/19/2015 for a clinical T3 N1, stage IIIA invasive ductal carcinoma, grade 1 or 2, estrogen receptor and progesterone receptor positive, HER-2 negative, with an MIB-1 between 5 and 10%  (1) status post bilateral mastectomies 05/09/2015, showing:   (a) on the right side, a pT2 pN0, stage IIA invasive ductal carcinoma, with negative margins and repeat HER-2 again negative    (b) on the left, a pT3 pN2, stage IIIA invasive ductal carcinoma, grade 2, with negative margins, and repeat HER-2 again negative  (2) Mammaprint from the left-sided tumor returned "luminal type, low risk", predicting a small benefit from chemotherapy with the important caveat that N2 disease was not included in the MINDACT study--patient opted for chemotherapy  (3) Oncotype from the right-sided tumor showed a score of 12, predicting an 8% risk of recurrence outside the breast within the next 10 years, if the patient's only systemic therapy was tamoxifen for 5 years. It also predicted no significant benefit from chemotherapy  (4) postmastectomy radiation to the left chest wall completed 08/15/2015  (5) adjuvant chemotherapy consisting of cyclophosphamide and docetaxel x4 completed on 11/21/15.  (5) anastrozole started 02/08/2016, completing 5 years August 2022  (6) left upper extremity lymphedema  (a) negative Doppler study 11/02/2015  (7) enrolled in B-WELL study 02/08/2016, follow-up for 10 years planned  (8) participating in [place] trial as of August 2017, follow-up completed AUG 2022  (a) randomized to palbociclib,  starting October 2017  (b) completed 2 years of palbociclib October 2019, with no dose reductions necessary   PLAN: Margaret [name] is now close to 6 years out from definitive surgery for her breast cancer with no evidence of disease recurrence.  This is very favorable.  She is going off anastrozole.  I anticipate she will start feeling a little bit better and with fewer arthralgias and myalgias within a couple of months.  Normally we would discharge her from follow-up at this point but the [place] trial requires 5 years more of yearly visits and so she will return to see [place] in 1 year.  She knows to call for any other issue that may develop before then.  Total encounter time 30 minutes.*   Shoaib Siefker, Virgie Dad, MD  03/15/21 4:09 PM Medical Oncology and Hematology [facility] [place], [place] [address] Tel. [phone]    Fax. [phone]   I, Wilburn Mylar, am acting as scribe for Dr. Virgie Dad. Hervey Wedig.  Lindie Spruce MD, have reviewed the above documentation for accuracy and completeness, and I agree  with the above.   *Total Encounter Time as defined by the Centers for Medicare and Medicaid Services includes, in addition to the face-to-face time of a patient visit (documented in the note above) non-face-to-face time: obtaining and reviewing outside history, ordering and reviewing medications, tests or procedures, care coordination (communications with other health care professionals or caregivers) and documentation in the medical record.

## 2021-03-16 ENCOUNTER — Other Ambulatory Visit: Payer: Self-pay | Admitting: Oncology

## 2021-03-16 ENCOUNTER — Ambulatory Visit: Payer: Medicare Other

## 2021-03-16 NOTE — Progress Notes (Signed)
Margaret [name]  [phone] [phone]   ID: Margaret [name] DOB: Oct 25, 1960  MR#: [id number]  YIF#:[id number]  Patient Care Team: Margaret Koch, MD as PCP - General (Internal Medicine) Margaret Kussmaul, MD as Consulting Physician (General Surgery) Margaret [name], Margaret Dad, MD as Consulting Physician (Oncology) Margaret Pray, MD as Consulting Physician (Radiation Oncology) Margaret Kaufmann, RN as Registered Nurse Margaret Germany, RN as Registered Nurse Margaret Ax, MD as Consulting Physician (Family Medicine) Margaret [name], [facility] as Pharmacist (Pharmacist) OTHER MD:  NB: this is a corrected note  CHIEF COMPLAINT:  estrogen receptor positive breast cancer; (s/p bilateral mastectomies)  CURRENT TREATMENT: completing 5 years of anastrozole  INTERVAL HISTORY: Margaret [name] returns today for follow-up of her estrogen receptor positive breast cancer.   She is ready to come off anastrozole.  She has had significant problems with hot flashes despite being on Effexor. Vaginal dryness is not a major issue.  She is status post bilateral mastectomies, so does not require annual mammography.  I am writing her a prescription for additional prostheses and bras as needed.   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  She has aches in her knees and chest which are chronic.  She still has peripheral neuropathy particularly in her toes and this bothers her most at night.  She does have compression stockings which she uses.  Aside from being on her feet at work she does not regularly exercise.  A detailed review of systems today was otherwise stable.   COVID 19 VACCINATION STATUS: [place] x3, most recently 07/2020   BREAST CANCER HISTORY: From the original intake note:  Margaret [name]'s primary care physician retired sometime ago and her health maintenance has not been up-to-date. She has been receiving medical care  through the emergency room and was seen in March 2015 following a head injury, then in August 2015 for lancing of an abscess. In December 2015 she noticed that her left breast looked a little bit smaller than her right. She brought this to the attention of friends and family over the next several months but the general feeling was that most people are not perfectly symmetrical. By the summer of this year she noticed that her nipple on the left was "going in". More recently she started developing pain in the left breast. She was evaluated for this in the emergency room 04/09/2015 at which time a left breast exam showed a deformed left breast with a large hard mass encompassing most of the breast, with nipple retraction. There was no nipple discharge or bleeding and no palpable adenopathy.  The patient was referred to [facility] and on 04/13/2015 she underwent bilateral diagnostic mammography with tomosynthesis as well as bilateral breast ultrasound. The breast density was category B. In the right breast at the 11:00 position there was a 1.3 cm irregular mass which by ultrasonography measured 1.3 cm.--In the left breast there was a 4 cm irregular mass at the 2:00 position associated with nipple retraction. There was a second, 1.7 cm area of architectural distortion at the 9:00 position. Both were palpable. By ultrasonography, the 4 cm irregular mass was noted, with a second mass measuring 2.5 cm by ultrasonography. Both axillae were benign.  On 04/19/2015 the patient underwent right breast upper outer quadrant biopsy, showing ([id number]) and invasive ductal carcinoma, grade 1, estrogen receptor 90% positive, progesterone receptor negative, with an MIB-1 of 5%, and no HER-2 amplification,  the signals ratio being 1.26 and the number per cell 2.20.  On the same day, she underwent biopsy of the 2 left breast masses in question as well as a suspicious left axillary lymph node. All 3 biopsies showed invasive ductal  carcinoma, grade 2, estrogen receptor 80-100% positive, progesterone receptor 80-100% positive, with the MIB-1 between 5 and 10%, and no HER-2 amplification, the signals ratio being between 1.05 and 1.13, and the number per cell between 2.10 and 2.25.  The patient's subsequent history is as detailed below   PAST MEDICAL HISTORY: Past Medical History:  Diagnosis Date   Allergy    Anxiety    Arthritis    Bilateral breast cancer ([place])    Breast cancer ([place])    Breast cancer of upper-outer quadrant of left female breast ([place]) 04/21/2015   Depression    Diabetes mellitus without complication (HCC)    GERD (gastroesophageal reflux disease)    Hypertension     PAST SURGICAL HISTORY: Past Surgical History:  Procedure Laterality Date   ABDOMINAL HYSTERECTOMY  1990   MASTECTOMY MODIFIED RADICAL Left 05/09/2015   MASTECTOMY MODIFIED RADICAL Left 05/09/2015   Procedure: LEFT MODIFIED RADICAL MASTETCTOMY;  Surgeon: Autumn Messing III, MD;  Location: [place];  Service: General;  Laterality: Left;   MASTECTOMY W/ SENTINEL NODE BIOPSY Right    MASTECTOMY W/ SENTINEL NODE BIOPSY Right 05/09/2015   Procedure: RIGHT MASTECTOMY WITH RIGHT AXILLARY SENTINEL LYMPH NODE BIOPSY;  Surgeon: Autumn Messing III, MD;  Location: [place];  Service: General;  Laterality: Right;   PORTACATH PLACEMENT Right 09/01/2015   Procedure: INSERTION PORT-A-CATH;  Surgeon: Autumn Messing III, MD;  Location: [place];  Service: General;  Laterality: Right;   TUBAL LIGATION  1984    FAMILY HISTORY Family History  Problem Relation Age of Onset   Hypertension Mother    Diabetes Mother    Colon cancer Neg Hx    Esophageal cancer Neg Hx    Rectal cancer Neg Hx    Stomach cancer Neg Hx     the patient has little information on her father. Her mother is living, currently age 28. She has one brother, 2 sisters. There is no history of breast or ovarian cancer in the family to her knowledge.    GYNECOLOGIC HISTORY:  No LMP  recorded. Patient has had a hysterectomy.  menarche age 64, first live birth age 45. She is GX P4. She underwent a total abdominal hysterectomy with bilateral salpingo-oophorectomy at age 61. She did not take hormone replacement. She never used oral contraceptives.    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 She previously worked the same position at [employer] for 25+ years. She is single and lives alone  The patient's daughter, [name], works as a Programmer, applications, as does her daughter [name]. Daughter [name] works at [employer], as a call. All 3 live in [place]. The patient's son Marc Morgans Plumb's lives in [place]. He prepares sets for shows The patient has 4 grandchildren, no great grandchildren. She attends a [employer] in [place].    ADVANCED DIRECTIVES: Not in place. At the initial clinic visit the patient was given the appropriate forms to complete and notarize at her discretion. The patient intends to name her daughter Sampson Si as healthcare power of attorney. She can be reached at [phone].   HEALTH MAINTENANCE: Social History  Tobacco Use   Smoking status: Former    Packs/day: 0.10    Years: 28.00    Pack years: 2.80    Types: Cigarettes    Quit date: 09/02/2018    Years since quitting: 2.5   Smokeless tobacco: Never  Vaping Use   Vaping Use: Never used  Substance Use Topics   Alcohol use: Yes    Alcohol/week: 0.0 standard drinks    Comment: Socially   Drug use: No     Colonoscopy: Never  PAP: Status post hysterectomy  Bone density: Never  Lipid panel:  Allergies  Allergen Reactions   Banana Hives    Tongue itching   Latex Itching and Other (See Comments)    burning   Peanuts [Peanut Oil] Hives    Patient is allergic to all tree nuts   Wheat Bran Hives    Current Outpatient Medications  Medication Sig Dispense Refill    amLODipine (NORVASC) 5 MG tablet TAKE 1 TABLET BY MOUTH DAILY. 90 tablet 6   atorvastatin (LIPITOR) 20 MG tablet TAKE 1 TABLET BY MOUTH ONCE A DAY 90 tablet 3   baclofen (LIORESAL) 20 MG tablet TAKE 1 TABLET BY MOUTH 3 TIMES DAILY 90 tablet 0   Blood Glucose Monitoring Suppl (BLOOD GLUCOSE MONITOR SYSTEM) w/Device KIT Use to test blood sugar 3 times a day 1 kit 0   busPIRone (BUSPAR) 5 MG tablet TAKE 1 TABLET BY MOUTH ONCE A DAY AS NEEDED FOR ANXIETY 30 tablet 2   calcium-vitamin D (OSCAL WITH D) 500-200 MG-UNIT tablet Take 1 tablet by mouth 2 (two) times daily. 60 tablet 0   diphenhydrAMINE (BENADRYL) 25 mg capsule Take 1 capsule (25 mg total) by mouth every 6 (six) hours as needed. (Patient taking differently: Take 25 mg by mouth every 6 (six) hours as needed for allergies.) 100 capsule 3   empagliflozin (JARDIANCE) 25 MG TABS tablet TAKE 1 TABLET BY MOUTH ONCE DAILY BEFORE BREAKFAST 90 tablet 3   gabapentin (NEURONTIN) 300 MG capsule TAKE 2 CAPSULES BY MOUTH 3 TIMES DAILY 180 capsule 2   glimepiride (AMARYL) 2 MG tablet TAKE 1 TABLET BY MOUTH DAILY BEFORE BREAKFAST 90 tablet 1   glucose blood test strip Use to test blood sugar 3 times a day 100 each 0   ibuprofen (ADVIL) 800 MG tablet TAKE 1 TABLET BY MOUTH EVERY 8 HOURS AS NEEDED 90 tablet 5   Lancets (ONETOUCH DELICA PLUS [id number]) MISC Use to test blood sugar 3 times a day 100 each 0   Multiple Vitamins-Minerals (CENTRUM SILVER 50+WOMEN PO) Take 1 tablet by mouth daily.     Olmesartan-amLODIPine-HCTZ 20-5-12.5 MG TABS TAKE 1 TABLET BY MOUTH ONCE A DAY. REPLACES AMLODIPINE AND LOSARTAN/HCTZ 90 tablet 4   pantoprazole (PROTONIX) 40 MG tablet TAKE 1 TABLET BY MOUTH ONCE DAILY 30 tablet 2   potassium chloride SA (KLOR-CON) 20 MEQ tablet Take 1 tablet by mouth 2 times daily. 90 tablet 4   traMADol (ULTRAM) 50 MG tablet TAKE 1 TABLET BY MOUTH DAILY AS NEEDED 30 tablet 5   traZODone (DESYREL) 50 MG tablet TAKE 1 TABLET BY MOUTH AT BEDTIME. MAY  INCREASE TO 2 TABLETS AFTER FEW DAYS IF NEEDED 60 tablet 6   valACYclovir (VALTREX) 1000 MG tablet TAKE 1 TABLET BY MOUTH TWICE DAILY 90 tablet 4   venlafaxine XR (EFFEXOR-XR) 150 MG 24 hr capsule TAKE 1 CAPSULE BY MOUTH ONCE DAILY WITH BREAKFAST 90 capsule 3   No current facility-administered medications for this visit.  OBJECTIVE:  African-American woman who appears stated age  There were no vitals filed for this visit.  Wt Readings from Last 3 Encounters:  03/14/21 249 lb 8 oz (113.2 kg)  03/14/21 250 lb 4.8 oz (113.5 kg)  12/13/20 248 lb 12.8 oz (112.9 kg)   There is no height or weight on file to calculate BMI.    ECOG FS:1 - Symptomatic but completely ambulatory  Sclerae unicteric, EOMs intact Wearing a mask No cervical or supraclavicular adenopathy Lungs no rales or rhonchi Heart regular rate and rhythm Abd soft, nontender, positive bowel sounds MSK no focal spinal tenderness, no upper extremity lymphedema Neuro: nonfocal, well oriented, appropriate affect Breasts: Status post bilateral mastectomies with no evidence of chest wall recurrence.  Both axillae are benign.   LAB RESULTS:  CMP     Component Value Date/Time   NA 142 03/14/2021 1140   NA 142 07/24/2017 1407   K 3.2 (L) 03/14/2021 1140   K 3.3 (L) 07/24/2017 1407   CL 107 03/14/2021 1140   CO2 26 03/14/2021 1140   CO2 26 07/24/2017 1407   GLUCOSE 174 (H) 03/14/2021 1140   GLUCOSE 110 07/24/2017 1407   BUN 18 03/14/2021 1140   BUN 13.4 07/24/2017 1407   CREATININE 0.81 03/14/2021 1140   CREATININE 0.83 03/10/2020 0919   CREATININE 1.0 07/24/2017 1407   CALCIUM 9.5 03/14/2021 1140   CALCIUM 9.6 07/24/2017 1407   PROT 7.7 03/14/2021 1140   PROT 8.5 (H) 07/24/2017 1407   ALBUMIN 3.8 03/14/2021 1140   ALBUMIN 3.9 07/24/2017 1407   AST 21 03/14/2021 1140   AST 18 03/10/2020 0919   AST 23 07/24/2017 1407   ALT 32 03/14/2021 1140   ALT 30 03/10/2020 0919   ALT 28 07/24/2017 1407   ALKPHOS 132 (H)  03/14/2021 1140   ALKPHOS 84 07/24/2017 1407   BILITOT 0.3 03/14/2021 1140   BILITOT <0.2 (L) 03/10/2020 0919   BILITOT 0.30 07/24/2017 1407   GFRNONAA >60 03/14/2021 1140   GFRNONAA >60 03/10/2020 0919   GFRAA >60 03/10/2020 0919    INo results found for: SPEP, UPEP  Lab Results  Component Value Date   WBC 9.8 03/14/2021   NEUTROABS 5.8 03/14/2021   HGB 14.2 03/14/2021   HCT 42.2 03/14/2021   MCV 89.8 03/14/2021   PLT 194 03/14/2021      Chemistry      Component Value Date/Time   NA 142 03/14/2021 1140   NA 142 07/24/2017 1407   K 3.2 (L) 03/14/2021 1140   K 3.3 (L) 07/24/2017 1407   CL 107 03/14/2021 1140   CO2 26 03/14/2021 1140   CO2 26 07/24/2017 1407   BUN 18 03/14/2021 1140   BUN 13.4 07/24/2017 1407   CREATININE 0.81 03/14/2021 1140   CREATININE 0.83 03/10/2020 0919   CREATININE 1.0 07/24/2017 1407   GLU 157 (H) 08/17/2016 0818      Component Value Date/Time   CALCIUM 9.5 03/14/2021 1140   CALCIUM 9.6 07/24/2017 1407   ALKPHOS 132 (H) 03/14/2021 1140   ALKPHOS 84 07/24/2017 1407   AST 21 03/14/2021 1140   AST 18 03/10/2020 0919   AST 23 07/24/2017 1407   ALT 32 03/14/2021 1140   ALT 30 03/10/2020 0919   ALT 28 07/24/2017 1407   BILITOT 0.3 03/14/2021 1140   BILITOT <0.2 (L) 03/10/2020 0919   BILITOT 0.30 07/24/2017 1407       No results found for: LABCA2  No components found  for: [id number]  No results for input(s): INR in the last 168 hours.  Urinalysis    Component Value Date/Time   COLORURINE YELLOW 06/29/2015 1030   APPEARANCEUR CLOUDY (A) 06/29/2015 1030   LABSPEC 1.019 06/29/2015 1030   PHURINE 7.0 06/29/2015 1030   GLUCOSEU NEGATIVE 06/29/2015 1030   HGBUR TRACE (A) 06/29/2015 1030   BILIRUBINUR NEGATIVE 06/29/2015 1030   KETONESUR NEGATIVE 06/29/2015 1030   PROTEINUR NEGATIVE 06/29/2015 1030   NITRITE NEGATIVE 06/29/2015 1030   LEUKOCYTESUR MODERATE (A) 06/29/2015 1030    STUDIES: No results found.   ASSESSMENT: 60 y.o.  Margaret [name] woman with synchronous breast cancers, as follows  (a) status post right breast upper outer quadrant biopsy 04/19/2015 for a clinical pT1c N0, stage IA invasive ductal carcinoma, grade 1, estrogen receptor positive, progesterone receptor negative, with an MIB-1 of 5%, and no HER-2 amplification  (b) status post left breast upper outer quadrant biopsy 2 and left axillary lymph node biopsy 04/19/2015 for a clinical T3 N1, stage IIIA invasive ductal carcinoma, grade 1 or 2, estrogen receptor and progesterone receptor positive, HER-2 negative, with an MIB-1 between 5 and 10%  (1) status post bilateral mastectomies 05/09/2015, showing:   (a) on the right side, a pT2 pN0, stage IIA invasive ductal carcinoma, with negative margins and repeat HER-2 again negative    (b) on the left, a pT3 pN2, stage IIIA invasive ductal carcinoma, grade 2, with negative margins, and repeat HER-2 again negative  (2) Mammaprint from the left-sided tumor returned "luminal type, low risk", predicting a small benefit from chemotherapy with the important caveat that N2 disease was not included in the MINDACT study--patient opted for chemotherapy  (3) Oncotype from the right-sided tumor showed a score of 12, predicting an 8% risk of recurrence outside the breast within the next 10 years, if the patient's only systemic therapy was tamoxifen for 5 years. It also predicted no significant benefit from chemotherapy  (4) postmastectomy radiation to the left chest wall completed 08/15/2015  (5) adjuvant chemotherapy consisting of cyclophosphamide and docetaxel x4 completed on 11/21/15.  (5) anastrozole started 02/08/2016, completing 5 years August 2022  (6) left upper extremity lymphedema  (a) negative Doppler study 11/02/2015  (7) enrolled in B-WELL study 02/08/2016, follow-up for 10 years planned  (8) participating in Spray trial as of August 2017, follow-up completed AUG 2022  (a) randomized to palbociclib,  starting October 2017  (b) completed 2 years of palbociclib October 2019, with no dose reductions necessary   PLAN: Margaret [name] is now close to 6 years out from definitive surgery for her breast cancer with no evidence of disease recurrence.  This is very favorable.  She is going off anastrozole.  I anticipate she will start feeling a little bit better and with fewer arthralgias and myalgias within a couple of months.  Normally we would discharge her from follow-up at this point but the BWell trial requires 5 years more of yearly visits and so she will return to see [place] in 1 year.  She knows to call for any other issue that may develop before then.  Total encounter time 30 minutes.*   Hensley Treat, Margaret Dad, MD  03/16/21 12:12 PM Medical Oncology and Hematology [facility] [place], [place] [address] Tel. [phone]    Fax. [phone]   I, Wilburn Mylar, am acting as scribe for Dr. Virgie [name]. Vic Esco.  Lindie Spruce MD, have reviewed the above documentation for accuracy and completeness, and I agree  with the above.   *Total Encounter Time as defined by the Centers for Medicare and Medicaid Services includes, in addition to the face-to-face time of a patient visit (documented in the note above) non-face-to-face time: obtaining and reviewing outside history, ordering and reviewing medications, tests or procedures, care coordination (communications with other health care professionals or caregivers) and documentation in the medical record.

## 2021-03-20 ENCOUNTER — Telehealth: Payer: Self-pay | Admitting: Hematology and Oncology

## 2021-03-20 NOTE — Telephone Encounter (Signed)
Appts scheduled per 8/11 sch msg. Mailed updated calendar to pt

## 2021-03-23 ENCOUNTER — Other Ambulatory Visit (HOSPITAL_COMMUNITY): Payer: Self-pay

## 2021-03-23 ENCOUNTER — Other Ambulatory Visit: Payer: Self-pay | Admitting: Internal Medicine

## 2021-03-23 MED ORDER — BACLOFEN 20 MG PO TABS
ORAL_TABLET | Freq: Three times a day (TID) | ORAL | 0 refills | Status: DC
  Filled 2021-03-23: qty 90, 30d supply, fill #0

## 2021-03-23 MED FILL — Olmesartan-Amlodipine-Hydrochlorothiazide Tab 20-5-12.5 MG: ORAL | 90 days supply | Qty: 90 | Fill #1 | Status: AC

## 2021-03-24 ENCOUNTER — Other Ambulatory Visit (HOSPITAL_COMMUNITY): Payer: Self-pay

## 2021-03-27 ENCOUNTER — Ambulatory Visit: Payer: Medicare Other | Admitting: Oncology

## 2021-03-27 ENCOUNTER — Other Ambulatory Visit: Payer: Medicare Other

## 2021-03-28 ENCOUNTER — Other Ambulatory Visit (HOSPITAL_COMMUNITY): Payer: Self-pay

## 2021-03-28 ENCOUNTER — Other Ambulatory Visit: Payer: Self-pay

## 2021-03-28 ENCOUNTER — Ambulatory Visit (INDEPENDENT_AMBULATORY_CARE_PROVIDER_SITE_OTHER): Payer: Medicare Other

## 2021-03-28 ENCOUNTER — Ambulatory Visit (INDEPENDENT_AMBULATORY_CARE_PROVIDER_SITE_OTHER): Payer: Medicare Other | Admitting: Internal Medicine

## 2021-03-28 ENCOUNTER — Encounter: Payer: Self-pay | Admitting: Internal Medicine

## 2021-03-28 VITALS — BP 132/78 | HR 85 | Temp 98.4°F | Resp 18 | Ht 69.0 in | Wt 252.6 lb

## 2021-03-28 DIAGNOSIS — M5416 Radiculopathy, lumbar region: Secondary | ICD-10-CM | POA: Diagnosis not present

## 2021-03-28 DIAGNOSIS — M47816 Spondylosis without myelopathy or radiculopathy, lumbar region: Secondary | ICD-10-CM | POA: Diagnosis not present

## 2021-03-28 DIAGNOSIS — M542 Cervicalgia: Secondary | ICD-10-CM

## 2021-03-28 DIAGNOSIS — E1169 Type 2 diabetes mellitus with other specified complication: Secondary | ICD-10-CM

## 2021-03-28 DIAGNOSIS — C50912 Malignant neoplasm of unspecified site of left female breast: Secondary | ICD-10-CM | POA: Diagnosis not present

## 2021-03-28 DIAGNOSIS — B379 Candidiasis, unspecified: Secondary | ICD-10-CM

## 2021-03-28 DIAGNOSIS — C50911 Malignant neoplasm of unspecified site of right female breast: Secondary | ICD-10-CM | POA: Diagnosis not present

## 2021-03-28 DIAGNOSIS — M47812 Spondylosis without myelopathy or radiculopathy, cervical region: Secondary | ICD-10-CM | POA: Diagnosis not present

## 2021-03-28 DIAGNOSIS — M4184 Other forms of scoliosis, thoracic region: Secondary | ICD-10-CM | POA: Diagnosis not present

## 2021-03-28 DIAGNOSIS — M47814 Spondylosis without myelopathy or radiculopathy, thoracic region: Secondary | ICD-10-CM | POA: Diagnosis not present

## 2021-03-28 MED ORDER — GLIMEPIRIDE 4 MG PO TABS
4.0000 mg | ORAL_TABLET | Freq: Every day | ORAL | 3 refills | Status: DC
  Filled 2021-03-28: qty 90, 90d supply, fill #0
  Filled 2021-06-20: qty 90, 90d supply, fill #1
  Filled 2021-10-02: qty 90, 90d supply, fill #2
  Filled 2021-12-25: qty 90, 90d supply, fill #3

## 2021-03-28 MED ORDER — MELOXICAM 15 MG PO TABS
15.0000 mg | ORAL_TABLET | Freq: Every day | ORAL | 0 refills | Status: DC
  Filled 2021-03-28: qty 30, 30d supply, fill #0

## 2021-03-28 MED ORDER — FLUCONAZOLE 150 MG PO TABS
150.0000 mg | ORAL_TABLET | ORAL | 0 refills | Status: AC
  Filled 2021-03-28: qty 3, 9d supply, fill #0

## 2021-03-28 NOTE — Progress Notes (Signed)
   Subjective:   Patient ID: Margaret [name], female    DOB: Oct 15, 1960, 60 y.o.   MRN: [id number]  HPI Recent HgA1c 7.1 with her insuranace company in the home. Is having problems with pain and is having to take multiple different medications to manage her pain and is hoping for more of a single solution. Her diabetes medication is giving her a yeast infection jardiance. Pain is whole back.   Review of Systems  Constitutional: Negative.   HENT: Negative.    Eyes: Negative.   Respiratory:  Negative for cough, chest tightness and shortness of breath.   Cardiovascular:  Negative for chest pain, palpitations and leg swelling.  Gastrointestinal:  Negative for abdominal distention, abdominal pain, constipation, diarrhea, nausea and vomiting.  Genitourinary:        Yeast infection  Musculoskeletal:  Positive for arthralgias and back pain.  Skin: Negative.   Neurological: Negative.   Psychiatric/Behavioral: Negative.     Objective:  Physical Exam Constitutional:      Appearance: She is well-developed. She is obese.  HENT:     Head: Normocephalic and atraumatic.  Cardiovascular:     Rate and Rhythm: Normal rate and regular rhythm.  Pulmonary:     Effort: Pulmonary effort is normal. No respiratory distress.     Breath sounds: Normal breath sounds. No wheezing or rales.  Abdominal:     General: Bowel sounds are normal. There is no distension.     Palpations: Abdomen is soft.     Tenderness: There is no abdominal tenderness. There is no rebound.  Musculoskeletal:        General: Tenderness present.     Cervical back: Normal range of motion.  Skin:    General: Skin is warm and dry.  Neurological:     Mental Status: She is alert and oriented to person, place, and time.     Coordination: Coordination normal.    Vitals:   03/28/21 1023  BP: 132/78  Pulse: 85  Resp: 18  Temp: 98.4 F (36.9 C)  TempSrc: Oral  SpO2: 94%  Weight: 252 lb 9.6 oz (114.6 kg)  Height: '5\' 9"'$  (1.753 m)     This visit occurred during the SARS-CoV-2 public health emergency.  Safety protocols were in place, including screening questions prior to the visit, additional usage of staff PPE, and extensive cleaning of exam room while observing appropriate contact time as indicated for disinfecting solutions.   Assessment & Plan:

## 2021-03-28 NOTE — Patient Instructions (Addendum)
We will have you stop jardiance and have sent in diflucan to take to clear the yeast infection. We will increase the dose of amaryl (glimepiride) to 4 mg daily to help keep the sugars stable.  We have sent in meloxicam to take daily instead of ibuprofen. Do not take these together. It is okay to take tylenol with this.  We will check the x-ray today on the back and neck and get you in with a pain specialist to try to make some changes.

## 2021-03-31 ENCOUNTER — Encounter: Payer: Self-pay | Admitting: Internal Medicine

## 2021-03-31 DIAGNOSIS — B379 Candidiasis, unspecified: Secondary | ICD-10-CM | POA: Insufficient documentation

## 2021-03-31 DIAGNOSIS — M542 Cervicalgia: Secondary | ICD-10-CM | POA: Insufficient documentation

## 2021-03-31 NOTE — Assessment & Plan Note (Signed)
Ordered x-ray lumbar and referral to neurosurgery. She is getting relief from tramadol daily prn, gabapentin 600 mg TID and baclofen 20 mg TID and meloxicam and tylenol but she wants to pursue more definitive option.

## 2021-03-31 NOTE — Assessment & Plan Note (Signed)
Recent HgA1c 7.1 done by home health and will stop jardiance due to recurrent yeast infection. Rx amaryl. She is on ARB and statin.

## 2021-03-31 NOTE — Assessment & Plan Note (Signed)
Rx diflucan and stop jardiance as this was the likely culprit for causing recurrent yeast infection.

## 2021-03-31 NOTE — Assessment & Plan Note (Signed)
Order x-ray thoracic and cervical as her pain is diffuse. Refer to neurosurgery as she is not satisfied with her current regimen of meloxicam daily, baclofen 20 mg TID and gabapentin 600 mg TID and tramadol 50 mg daily and tylenol prn.

## 2021-04-03 ENCOUNTER — Encounter: Payer: Self-pay | Admitting: Physical Medicine and Rehabilitation

## 2021-04-06 DIAGNOSIS — C50912 Malignant neoplasm of unspecified site of left female breast: Secondary | ICD-10-CM | POA: Diagnosis not present

## 2021-04-06 DIAGNOSIS — C50911 Malignant neoplasm of unspecified site of right female breast: Secondary | ICD-10-CM | POA: Diagnosis not present

## 2021-04-07 ENCOUNTER — Other Ambulatory Visit: Payer: Self-pay | Admitting: Internal Medicine

## 2021-04-12 ENCOUNTER — Other Ambulatory Visit (HOSPITAL_COMMUNITY): Payer: Self-pay

## 2021-04-18 DIAGNOSIS — M545 Low back pain, unspecified: Secondary | ICD-10-CM | POA: Diagnosis not present

## 2021-04-18 DIAGNOSIS — I10 Essential (primary) hypertension: Secondary | ICD-10-CM | POA: Diagnosis not present

## 2021-04-18 DIAGNOSIS — M542 Cervicalgia: Secondary | ICD-10-CM | POA: Diagnosis not present

## 2021-04-18 DIAGNOSIS — M5412 Radiculopathy, cervical region: Secondary | ICD-10-CM | POA: Diagnosis not present

## 2021-04-21 ENCOUNTER — Other Ambulatory Visit (HOSPITAL_COMMUNITY): Payer: Self-pay

## 2021-04-22 ENCOUNTER — Other Ambulatory Visit (HOSPITAL_COMMUNITY): Payer: Self-pay

## 2021-04-25 DIAGNOSIS — M5412 Radiculopathy, cervical region: Secondary | ICD-10-CM | POA: Diagnosis not present

## 2021-04-25 DIAGNOSIS — M542 Cervicalgia: Secondary | ICD-10-CM | POA: Diagnosis not present

## 2021-04-27 ENCOUNTER — Other Ambulatory Visit: Payer: Self-pay | Admitting: Internal Medicine

## 2021-04-27 ENCOUNTER — Other Ambulatory Visit (HOSPITAL_COMMUNITY): Payer: Self-pay

## 2021-05-01 ENCOUNTER — Other Ambulatory Visit (HOSPITAL_COMMUNITY): Payer: Self-pay

## 2021-05-01 NOTE — Telephone Encounter (Signed)
Patient calling in to see why refill request were denied  Please follow up

## 2021-05-08 ENCOUNTER — Other Ambulatory Visit: Payer: Self-pay | Admitting: Oncology

## 2021-05-08 ENCOUNTER — Other Ambulatory Visit: Payer: Self-pay | Admitting: Internal Medicine

## 2021-05-08 ENCOUNTER — Other Ambulatory Visit (HOSPITAL_COMMUNITY): Payer: Self-pay

## 2021-05-08 DIAGNOSIS — C50211 Malignant neoplasm of upper-inner quadrant of right female breast: Secondary | ICD-10-CM

## 2021-05-08 DIAGNOSIS — C50412 Malignant neoplasm of upper-outer quadrant of left female breast: Secondary | ICD-10-CM

## 2021-05-08 MED ORDER — VENLAFAXINE HCL ER 150 MG PO CP24
ORAL_CAPSULE | Freq: Every day | ORAL | 3 refills | Status: DC
  Filled 2021-05-08: qty 90, 90d supply, fill #0
  Filled 2021-07-21: qty 90, 90d supply, fill #1
  Filled 2021-10-31: qty 90, 90d supply, fill #2
  Filled 2022-02-02: qty 90, 90d supply, fill #3

## 2021-05-09 ENCOUNTER — Other Ambulatory Visit (HOSPITAL_COMMUNITY): Payer: Self-pay

## 2021-05-09 MED ORDER — MELOXICAM 15 MG PO TABS
15.0000 mg | ORAL_TABLET | Freq: Every day | ORAL | 0 refills | Status: DC
  Filled 2021-05-09: qty 30, 30d supply, fill #0

## 2021-05-09 NOTE — Telephone Encounter (Signed)
Please advise as the pt has stated she is in need of her med refill and wants to know why the refills were denied as she was seen on 03/28/2021.  Medications needing refill: traMADol (ULTRAM) 50 MG tablet and ibuprofen (ADVIL) 800 MG tablet

## 2021-05-15 ENCOUNTER — Telehealth (INDEPENDENT_AMBULATORY_CARE_PROVIDER_SITE_OTHER): Payer: Medicare Other | Admitting: Internal Medicine

## 2021-05-15 ENCOUNTER — Other Ambulatory Visit (HOSPITAL_COMMUNITY): Payer: Self-pay

## 2021-05-15 DIAGNOSIS — C50211 Malignant neoplasm of upper-inner quadrant of right female breast: Secondary | ICD-10-CM

## 2021-05-15 DIAGNOSIS — C50412 Malignant neoplasm of upper-outer quadrant of left female breast: Secondary | ICD-10-CM

## 2021-05-15 DIAGNOSIS — Z170 Estrogen receptor positive status [ER+]: Secondary | ICD-10-CM | POA: Diagnosis not present

## 2021-05-15 DIAGNOSIS — M25551 Pain in right hip: Secondary | ICD-10-CM

## 2021-05-15 DIAGNOSIS — M5416 Radiculopathy, lumbar region: Secondary | ICD-10-CM

## 2021-05-15 DIAGNOSIS — M25552 Pain in left hip: Secondary | ICD-10-CM | POA: Diagnosis not present

## 2021-05-15 MED ORDER — TRAMADOL HCL 50 MG PO TABS
ORAL_TABLET | Freq: Every day | ORAL | 5 refills | Status: AC | PRN
  Filled 2021-05-15: qty 30, 30d supply, fill #0
  Filled 2021-06-13: qty 30, 30d supply, fill #1
  Filled 2021-07-10: qty 30, 30d supply, fill #2
  Filled 2021-08-09: qty 30, 30d supply, fill #3
  Filled 2021-09-07: qty 30, 30d supply, fill #4
  Filled 2021-10-02 – 2021-10-04 (×2): qty 30, 30d supply, fill #5

## 2021-05-15 MED ORDER — BACLOFEN 20 MG PO TABS
ORAL_TABLET | Freq: Three times a day (TID) | ORAL | 5 refills | Status: DC
Start: 2021-05-15 — End: 2021-11-27
  Filled 2021-05-15: qty 90, 30d supply, fill #0
  Filled 2021-06-12: qty 90, 30d supply, fill #1
  Filled 2021-07-21: qty 90, 30d supply, fill #2
  Filled 2021-08-31: qty 90, 30d supply, fill #3
  Filled 2021-10-02: qty 90, 30d supply, fill #4
  Filled 2021-10-25: qty 90, 30d supply, fill #5

## 2021-05-15 MED ORDER — IBUPROFEN 800 MG PO TABS
800.0000 mg | ORAL_TABLET | Freq: Three times a day (TID) | ORAL | 5 refills | Status: DC | PRN
  Filled 2021-05-15: qty 90, 30d supply, fill #0
  Filled 2021-06-13: qty 90, 30d supply, fill #1
  Filled 2021-07-10: qty 90, 30d supply, fill #2
  Filled 2021-08-09: qty 90, 30d supply, fill #3
  Filled 2021-09-19: qty 90, 30d supply, fill #4
  Filled 2021-10-18: qty 90, 30d supply, fill #5

## 2021-05-15 NOTE — Progress Notes (Signed)
Virtual Visit via Video Note  I connected with Margaret [name] on 05/15/21 at  3:40 PM EDT by a video enabled telemedicine application and verified that I am speaking with the correct person using two identifiers.  The patient and the provider were at separate locations throughout the entire encounter. Patient location: home, Provider location: work   I discussed the limitations of evaluation and management by telemedicine and the availability of in person appointments. The patient expressed understanding and agreed to proceed. The patient and the provider were the only parties present for the visit unless noted in HPI below.  History of Present Illness: The patient is a 60 y.o. female with visit for chronic pain in the back. Seeing spine specialist soon but needs refills in the meantime.  Observations/Objective: Appearance: normal, breathing appears normal, casual grooming, abdomen does not appear distended, mental status is A and O times 3  Assessment and Plan: See problem oriented charting  Follow Up Instructions: refills done, keep visit with neurosurgery  I discussed the assessment and treatment plan with the patient. The patient was provided an opportunity to ask questions and all were answered. The patient agreed with the plan and demonstrated an understanding of the instructions.   The patient was advised to call back or seek an in-person evaluation if the symptoms worsen or if the condition fails to improve as anticipated.  Hoyt Koch, MD

## 2021-05-16 ENCOUNTER — Other Ambulatory Visit (HOSPITAL_COMMUNITY): Payer: Self-pay

## 2021-05-16 ENCOUNTER — Other Ambulatory Visit: Payer: Self-pay | Admitting: Oncology

## 2021-05-16 DIAGNOSIS — M542 Cervicalgia: Secondary | ICD-10-CM | POA: Diagnosis not present

## 2021-05-16 DIAGNOSIS — M4722 Other spondylosis with radiculopathy, cervical region: Secondary | ICD-10-CM | POA: Diagnosis not present

## 2021-05-16 DIAGNOSIS — I10 Essential (primary) hypertension: Secondary | ICD-10-CM | POA: Diagnosis not present

## 2021-05-16 DIAGNOSIS — C50412 Malignant neoplasm of upper-outer quadrant of left female breast: Secondary | ICD-10-CM

## 2021-05-16 MED ORDER — PANTOPRAZOLE SODIUM 40 MG PO TBEC
DELAYED_RELEASE_TABLET | Freq: Every day | ORAL | 2 refills | Status: DC
  Filled 2021-05-16: qty 30, 30d supply, fill #0
  Filled 2021-06-12: qty 30, 30d supply, fill #1
  Filled 2021-07-10: qty 30, 30d supply, fill #2

## 2021-05-16 MED ORDER — HYDROCODONE-ACETAMINOPHEN 5-325 MG PO TABS
ORAL_TABLET | ORAL | 0 refills | Status: DC
  Filled 2021-05-16: qty 30, 8d supply, fill #0

## 2021-05-17 ENCOUNTER — Encounter: Payer: Self-pay | Admitting: Internal Medicine

## 2021-05-17 NOTE — Assessment & Plan Note (Signed)
She is still struggling with this and seeing neurosurgery next week. Meloxicam/ibuprofen seem to help with this pain and she does not take both on the same day.

## 2021-05-17 NOTE — Assessment & Plan Note (Signed)
She is looking for a longer term solution for her pain or consolidation of regimen. Refilled tramadol, baclofen and ibuprofen today. She understands not to take meloxicam and ibuprofen in the same day.

## 2021-05-25 ENCOUNTER — Other Ambulatory Visit (HOSPITAL_BASED_OUTPATIENT_CLINIC_OR_DEPARTMENT_OTHER): Payer: Self-pay

## 2021-05-26 ENCOUNTER — Other Ambulatory Visit: Payer: Self-pay | Admitting: Oncology

## 2021-05-26 ENCOUNTER — Other Ambulatory Visit (HOSPITAL_COMMUNITY): Payer: Self-pay

## 2021-05-26 DIAGNOSIS — G629 Polyneuropathy, unspecified: Secondary | ICD-10-CM

## 2021-05-26 DIAGNOSIS — C50012 Malignant neoplasm of nipple and areola, left female breast: Secondary | ICD-10-CM

## 2021-05-26 MED ORDER — GABAPENTIN 300 MG PO CAPS
ORAL_CAPSULE | ORAL | 2 refills | Status: DC
  Filled 2021-05-26: qty 180, 30d supply, fill #0
  Filled 2021-07-12: qty 180, 30d supply, fill #1
  Filled 2021-08-09: qty 180, 30d supply, fill #2

## 2021-06-06 ENCOUNTER — Ambulatory Visit: Payer: Medicare Other | Admitting: Physical Medicine & Rehabilitation

## 2021-06-07 ENCOUNTER — Other Ambulatory Visit (HOSPITAL_COMMUNITY): Payer: Self-pay

## 2021-06-07 ENCOUNTER — Other Ambulatory Visit: Payer: Self-pay | Admitting: Internal Medicine

## 2021-06-07 MED ORDER — MELOXICAM 15 MG PO TABS
15.0000 mg | ORAL_TABLET | Freq: Every day | ORAL | 0 refills | Status: DC
  Filled 2021-06-07: qty 30, 30d supply, fill #0

## 2021-06-12 ENCOUNTER — Other Ambulatory Visit (HOSPITAL_COMMUNITY): Payer: Self-pay

## 2021-06-12 MED FILL — Atorvastatin Calcium Tab 20 MG (Base Equivalent): ORAL | 90 days supply | Qty: 90 | Fill #2 | Status: AC

## 2021-06-13 ENCOUNTER — Other Ambulatory Visit (HOSPITAL_COMMUNITY): Payer: Self-pay

## 2021-06-14 ENCOUNTER — Other Ambulatory Visit (HOSPITAL_COMMUNITY): Payer: Self-pay

## 2021-06-20 ENCOUNTER — Other Ambulatory Visit (HOSPITAL_COMMUNITY): Payer: Self-pay

## 2021-06-20 MED FILL — Olmesartan-Amlodipine-Hydrochlorothiazide Tab 20-5-12.5 MG: ORAL | 90 days supply | Qty: 90 | Fill #2 | Status: AC

## 2021-06-21 ENCOUNTER — Other Ambulatory Visit (HOSPITAL_COMMUNITY): Payer: Self-pay

## 2021-07-10 ENCOUNTER — Other Ambulatory Visit (HOSPITAL_COMMUNITY): Payer: Self-pay

## 2021-07-10 ENCOUNTER — Other Ambulatory Visit: Payer: Self-pay | Admitting: Internal Medicine

## 2021-07-11 ENCOUNTER — Encounter: Payer: Medicare Other | Admitting: Physical Medicine and Rehabilitation

## 2021-07-11 ENCOUNTER — Other Ambulatory Visit (HOSPITAL_COMMUNITY): Payer: Self-pay

## 2021-07-11 MED ORDER — MELOXICAM 15 MG PO TABS
15.0000 mg | ORAL_TABLET | Freq: Every day | ORAL | 0 refills | Status: DC
  Filled 2021-07-11: qty 30, 30d supply, fill #0

## 2021-07-12 ENCOUNTER — Other Ambulatory Visit (HOSPITAL_COMMUNITY): Payer: Self-pay

## 2021-07-21 ENCOUNTER — Other Ambulatory Visit (HOSPITAL_COMMUNITY): Payer: Self-pay

## 2021-07-26 ENCOUNTER — Other Ambulatory Visit (HOSPITAL_COMMUNITY): Payer: Self-pay

## 2021-08-09 ENCOUNTER — Other Ambulatory Visit (HOSPITAL_COMMUNITY): Payer: Self-pay

## 2021-08-09 ENCOUNTER — Other Ambulatory Visit: Payer: Self-pay | Admitting: Internal Medicine

## 2021-08-09 ENCOUNTER — Other Ambulatory Visit: Payer: Self-pay | Admitting: Oncology

## 2021-08-09 DIAGNOSIS — Z170 Estrogen receptor positive status [ER+]: Secondary | ICD-10-CM

## 2021-08-09 DIAGNOSIS — C50412 Malignant neoplasm of upper-outer quadrant of left female breast: Secondary | ICD-10-CM

## 2021-08-09 MED ORDER — MELOXICAM 15 MG PO TABS
15.0000 mg | ORAL_TABLET | Freq: Every day | ORAL | 0 refills | Status: DC
  Filled 2021-08-09: qty 30, 30d supply, fill #0

## 2021-08-10 ENCOUNTER — Other Ambulatory Visit (HOSPITAL_COMMUNITY): Payer: Self-pay

## 2021-08-10 MED ORDER — PANTOPRAZOLE SODIUM 40 MG PO TBEC
DELAYED_RELEASE_TABLET | Freq: Every day | ORAL | 2 refills | Status: DC
  Filled 2021-08-10: qty 30, 30d supply, fill #0
  Filled 2021-09-07: qty 30, 30d supply, fill #1
  Filled 2021-10-18: qty 30, 30d supply, fill #2

## 2021-08-29 ENCOUNTER — Other Ambulatory Visit (HOSPITAL_COMMUNITY): Payer: Self-pay

## 2021-08-29 DIAGNOSIS — I10 Essential (primary) hypertension: Secondary | ICD-10-CM | POA: Diagnosis not present

## 2021-08-29 DIAGNOSIS — M4722 Other spondylosis with radiculopathy, cervical region: Secondary | ICD-10-CM | POA: Diagnosis not present

## 2021-08-29 MED ORDER — OXYCODONE-ACETAMINOPHEN 5-325 MG PO TABS
ORAL_TABLET | ORAL | 0 refills | Status: DC
  Filled 2021-08-29: qty 40, 10d supply, fill #0

## 2021-08-31 ENCOUNTER — Other Ambulatory Visit (HOSPITAL_COMMUNITY): Payer: Self-pay

## 2021-09-01 ENCOUNTER — Other Ambulatory Visit (HOSPITAL_COMMUNITY): Payer: Self-pay

## 2021-09-07 ENCOUNTER — Other Ambulatory Visit: Payer: Self-pay | Admitting: Internal Medicine

## 2021-09-07 ENCOUNTER — Other Ambulatory Visit (HOSPITAL_COMMUNITY): Payer: Self-pay

## 2021-09-07 MED ORDER — MELOXICAM 15 MG PO TABS
15.0000 mg | ORAL_TABLET | Freq: Every day | ORAL | 0 refills | Status: DC
  Filled 2021-09-07: qty 30, 30d supply, fill #0

## 2021-09-14 ENCOUNTER — Other Ambulatory Visit: Payer: Self-pay | Admitting: Internal Medicine

## 2021-09-14 ENCOUNTER — Other Ambulatory Visit (HOSPITAL_COMMUNITY): Payer: Self-pay

## 2021-09-14 MED ORDER — ATORVASTATIN CALCIUM 20 MG PO TABS
ORAL_TABLET | Freq: Every day | ORAL | 3 refills | Status: DC
  Filled 2021-09-14: qty 90, 90d supply, fill #0
  Filled 2021-12-17: qty 90, 90d supply, fill #1
  Filled 2022-03-22: qty 90, 90d supply, fill #2
  Filled 2022-06-25: qty 90, 90d supply, fill #3

## 2021-09-19 ENCOUNTER — Other Ambulatory Visit: Payer: Self-pay

## 2021-09-19 ENCOUNTER — Other Ambulatory Visit (HOSPITAL_COMMUNITY): Payer: Self-pay

## 2021-09-27 ENCOUNTER — Other Ambulatory Visit (HOSPITAL_COMMUNITY): Payer: Self-pay

## 2021-09-27 ENCOUNTER — Other Ambulatory Visit: Payer: Self-pay | Admitting: Oncology

## 2021-09-27 DIAGNOSIS — C50011 Malignant neoplasm of nipple and areola, right female breast: Secondary | ICD-10-CM

## 2021-09-27 DIAGNOSIS — G629 Polyneuropathy, unspecified: Secondary | ICD-10-CM

## 2021-09-27 MED ORDER — OLMESARTAN-AMLODIPINE-HCTZ 20-5-12.5 MG PO TABS
ORAL_TABLET | ORAL | 4 refills | Status: DC
  Filled 2021-09-27: qty 90, 90d supply, fill #0
  Filled 2021-12-25: qty 90, 90d supply, fill #1
  Filled 2022-04-10: qty 90, 90d supply, fill #2
  Filled 2022-07-11: qty 90, 90d supply, fill #3

## 2021-09-27 MED ORDER — GABAPENTIN 300 MG PO CAPS
ORAL_CAPSULE | ORAL | 2 refills | Status: DC
  Filled 2021-09-27: qty 180, 30d supply, fill #0
  Filled 2021-10-25: qty 180, 30d supply, fill #1
  Filled 2021-11-27: qty 180, 30d supply, fill #2

## 2021-09-28 ENCOUNTER — Other Ambulatory Visit (HOSPITAL_COMMUNITY): Payer: Self-pay

## 2021-10-02 ENCOUNTER — Other Ambulatory Visit (HOSPITAL_COMMUNITY): Payer: Self-pay

## 2021-10-04 ENCOUNTER — Other Ambulatory Visit (HOSPITAL_COMMUNITY): Payer: Self-pay

## 2021-10-10 ENCOUNTER — Other Ambulatory Visit (HOSPITAL_COMMUNITY): Payer: Self-pay

## 2021-10-11 ENCOUNTER — Other Ambulatory Visit (HOSPITAL_COMMUNITY): Payer: Self-pay

## 2021-10-13 ENCOUNTER — Other Ambulatory Visit: Payer: Self-pay | Admitting: Internal Medicine

## 2021-10-13 ENCOUNTER — Other Ambulatory Visit (HOSPITAL_COMMUNITY): Payer: Self-pay

## 2021-10-13 MED ORDER — MELOXICAM 15 MG PO TABS
15.0000 mg | ORAL_TABLET | Freq: Every day | ORAL | 0 refills | Status: DC
  Filled 2021-10-13: qty 30, 30d supply, fill #0

## 2021-10-18 ENCOUNTER — Other Ambulatory Visit: Payer: Self-pay

## 2021-10-18 ENCOUNTER — Other Ambulatory Visit (HOSPITAL_COMMUNITY): Payer: Self-pay

## 2021-10-23 ENCOUNTER — Other Ambulatory Visit (HOSPITAL_COMMUNITY): Payer: Self-pay

## 2021-10-25 ENCOUNTER — Other Ambulatory Visit (HOSPITAL_COMMUNITY): Payer: Self-pay

## 2021-10-25 MED ORDER — OXYCODONE-ACETAMINOPHEN 5-325 MG PO TABS
ORAL_TABLET | ORAL | 0 refills | Status: DC
  Filled 2021-10-25: qty 40, 10d supply, fill #0

## 2021-10-26 ENCOUNTER — Other Ambulatory Visit (HOSPITAL_COMMUNITY): Payer: Self-pay

## 2021-10-30 ENCOUNTER — Telehealth: Payer: Self-pay | Admitting: Internal Medicine

## 2021-10-30 NOTE — Telephone Encounter (Signed)
Spoke with the pt and she has been scheduled for 11/02/2021 at 9:20 am due to a visit being needed  ?

## 2021-10-30 NOTE — Telephone Encounter (Signed)
Pt states [facility] informed her they have sent 6 faxes for her incontinence supplies and has not received a response ? ?Informed pt I could not locate a fax from [facility], inquired if pt had a fax number for them, pt did not ? ?Gave pt office fax number to verify w/ [facility] faxes are being sent to correct number ? ?Pt states she is out of supplies and requesting a cb ?

## 2021-11-01 ENCOUNTER — Other Ambulatory Visit: Payer: Self-pay | Admitting: Oncology

## 2021-11-01 ENCOUNTER — Other Ambulatory Visit: Payer: Self-pay

## 2021-11-01 ENCOUNTER — Other Ambulatory Visit (HOSPITAL_COMMUNITY): Payer: Self-pay

## 2021-11-01 DIAGNOSIS — C50412 Malignant neoplasm of upper-outer quadrant of left female breast: Secondary | ICD-10-CM

## 2021-11-01 MED ORDER — VALACYCLOVIR HCL 1 G PO TABS
ORAL_TABLET | Freq: Two times a day (BID) | ORAL | 4 refills | Status: DC
  Filled 2021-11-01: qty 90, 45d supply, fill #0
  Filled 2022-02-02: qty 90, 45d supply, fill #1
  Filled 2022-03-19: qty 90, 45d supply, fill #2
  Filled 2022-09-18: qty 90, 45d supply, fill #3

## 2021-11-02 ENCOUNTER — Encounter: Payer: Self-pay | Admitting: Internal Medicine

## 2021-11-02 ENCOUNTER — Ambulatory Visit (INDEPENDENT_AMBULATORY_CARE_PROVIDER_SITE_OTHER): Payer: Medicare Other | Admitting: Internal Medicine

## 2021-11-02 ENCOUNTER — Other Ambulatory Visit (HOSPITAL_COMMUNITY): Payer: Self-pay

## 2021-11-02 DIAGNOSIS — N3941 Urge incontinence: Secondary | ICD-10-CM | POA: Diagnosis not present

## 2021-11-02 DIAGNOSIS — M5416 Radiculopathy, lumbar region: Secondary | ICD-10-CM

## 2021-11-02 MED ORDER — PREDNISONE 20 MG PO TABS
40.0000 mg | ORAL_TABLET | Freq: Every day | ORAL | 0 refills | Status: DC
  Filled 2021-11-02: qty 10, 5d supply, fill #0

## 2021-11-02 NOTE — Progress Notes (Signed)
? ?  Subjective:  ? ?Patient ID: Margaret [name], female    DOB: [birth year], 61 y.o.   MRN: [id number] ? ?HPI ?The patient is a 61 YO female coming in for follow up incontinence. Urge incontinence and does not want to take meds. Has not been on any in the past. Overall mild worsening.  ? ?Review of Systems  ?Constitutional: Negative.   ?HENT: Negative.    ?Eyes: Negative.   ?Respiratory:  Negative for cough, chest tightness and shortness of breath.   ?Cardiovascular:  Negative for chest pain, palpitations and leg swelling.  ?Gastrointestinal:  Negative for abdominal distention, abdominal pain, constipation, diarrhea, nausea and vomiting.  ?Genitourinary:  Positive for enuresis and urgency.  ?Musculoskeletal: Negative.   ?Skin: Negative.   ?Neurological: Negative.   ?Psychiatric/Behavioral: Negative.    ? ?Objective:  ?Physical Exam ?Constitutional:   ?   Appearance: She is well-developed. She is obese.  ?HENT:  ?   Head: Normocephalic and atraumatic.  ?Cardiovascular:  ?   Rate and Rhythm: Normal rate and regular rhythm.  ?Pulmonary:  ?   Effort: Pulmonary effort is normal. No respiratory distress.  ?   Breath sounds: Normal breath sounds. No wheezing or rales.  ?Abdominal:  ?   General: Bowel sounds are normal. There is no distension.  ?   Palpations: Abdomen is soft.  ?   Tenderness: There is no abdominal tenderness. There is no rebound.  ?Musculoskeletal:  ?   Cervical back: Normal range of motion.  ?Skin: ?   General: Skin is warm and dry.  ?Neurological:  ?   Mental Status: She is alert and oriented to person, place, and time.  ?   Coordination: Coordination normal.  ? ? ?Vitals:  ? 11/02/21 0947  ?BP: 130/66  ?Pulse: 88  ?Resp: 18  ?SpO2: 99%  ?Weight: 251 lb 9.6 oz (114.1 kg)  ?Height: '5\' 9"'$  (1.753 m)  ? ? ?This visit occurred during the SARS-CoV-2 public health emergency.  Safety protocols were in place, including screening questions prior to the visit, additional usage of staff PPE, and extensive cleaning of  exam room while observing appropriate contact time as indicated for disinfecting solutions.  ? ?Assessment & Plan:  ? ?

## 2021-11-02 NOTE — Patient Instructions (Signed)
We will fill out the forms. ? ?We have sent in prednisone to take 2 pills daily for 5 days. ? ? ?

## 2021-11-03 NOTE — Assessment & Plan Note (Signed)
Flare today. Rx prednisone 5 day course to help. ?

## 2021-11-03 NOTE — Assessment & Plan Note (Signed)
Overall stable and still has symptoms. We will fill out forms for incontinence supplies. Offered medications and she does not wish to try due to potential side effects at this time.  ?

## 2021-11-08 ENCOUNTER — Other Ambulatory Visit (HOSPITAL_COMMUNITY): Payer: Self-pay

## 2021-11-08 ENCOUNTER — Other Ambulatory Visit: Payer: Self-pay | Admitting: Oncology

## 2021-11-08 DIAGNOSIS — G4720 Circadian rhythm sleep disorder, unspecified type: Secondary | ICD-10-CM

## 2021-11-10 ENCOUNTER — Other Ambulatory Visit: Payer: Self-pay | Admitting: Oncology

## 2021-11-10 ENCOUNTER — Other Ambulatory Visit (HOSPITAL_COMMUNITY): Payer: Self-pay

## 2021-11-10 DIAGNOSIS — G4720 Circadian rhythm sleep disorder, unspecified type: Secondary | ICD-10-CM

## 2021-11-14 ENCOUNTER — Other Ambulatory Visit (HOSPITAL_COMMUNITY): Payer: Self-pay

## 2021-11-15 ENCOUNTER — Ambulatory Visit: Payer: Medicare Other

## 2021-11-15 ENCOUNTER — Other Ambulatory Visit (HOSPITAL_COMMUNITY): Payer: Self-pay

## 2021-11-15 MED ORDER — TRAZODONE HCL 50 MG PO TABS
ORAL_TABLET | ORAL | 6 refills | Status: DC
  Filled 2021-11-15: qty 60, 30d supply, fill #0
  Filled 2021-12-17: qty 60, 30d supply, fill #1
  Filled 2022-01-30: qty 60, 30d supply, fill #2
  Filled 2022-03-02: qty 60, 30d supply, fill #3
  Filled 2022-04-06: qty 60, 30d supply, fill #4
  Filled 2022-05-14: qty 60, 30d supply, fill #5
  Filled 2022-06-25: qty 60, 30d supply, fill #6

## 2021-11-16 ENCOUNTER — Other Ambulatory Visit: Payer: Self-pay | Admitting: Internal Medicine

## 2021-11-16 ENCOUNTER — Other Ambulatory Visit: Payer: Self-pay | Admitting: Hematology and Oncology

## 2021-11-16 ENCOUNTER — Other Ambulatory Visit (HOSPITAL_COMMUNITY): Payer: Self-pay

## 2021-11-16 DIAGNOSIS — Z170 Estrogen receptor positive status [ER+]: Secondary | ICD-10-CM

## 2021-11-16 MED ORDER — MELOXICAM 15 MG PO TABS
15.0000 mg | ORAL_TABLET | Freq: Every day | ORAL | 0 refills | Status: DC
  Filled 2021-11-16: qty 30, 30d supply, fill #0

## 2021-11-16 MED ORDER — PANTOPRAZOLE SODIUM 40 MG PO TBEC
DELAYED_RELEASE_TABLET | Freq: Every day | ORAL | 2 refills | Status: DC
  Filled 2021-11-16: qty 30, 30d supply, fill #0
  Filled 2021-12-17: qty 30, 30d supply, fill #1
  Filled 2022-01-24: qty 30, 30d supply, fill #2

## 2021-11-17 ENCOUNTER — Other Ambulatory Visit (HOSPITAL_COMMUNITY): Payer: Self-pay

## 2021-11-17 NOTE — Telephone Encounter (Signed)
Will not refill as she is getting oxycodone from another provider.  ?

## 2021-11-27 ENCOUNTER — Other Ambulatory Visit: Payer: Self-pay | Admitting: Internal Medicine

## 2021-11-27 ENCOUNTER — Other Ambulatory Visit (HOSPITAL_COMMUNITY): Payer: Self-pay

## 2021-11-27 MED ORDER — BACLOFEN 20 MG PO TABS
ORAL_TABLET | Freq: Three times a day (TID) | ORAL | 5 refills | Status: DC
  Filled 2021-11-27: qty 90, 30d supply, fill #0
  Filled 2021-12-27: qty 90, 30d supply, fill #1
  Filled 2022-01-24: qty 90, 30d supply, fill #2
  Filled 2022-02-21: qty 90, 30d supply, fill #3
  Filled 2022-03-19: qty 90, 30d supply, fill #4
  Filled 2022-04-17: qty 90, 30d supply, fill #5

## 2021-11-27 NOTE — Telephone Encounter (Signed)
Pls advise on refill.../lmb 

## 2021-11-28 ENCOUNTER — Other Ambulatory Visit (HOSPITAL_COMMUNITY): Payer: Self-pay

## 2021-11-28 ENCOUNTER — Other Ambulatory Visit: Payer: Self-pay | Admitting: Internal Medicine

## 2021-11-28 DIAGNOSIS — M542 Cervicalgia: Secondary | ICD-10-CM | POA: Diagnosis not present

## 2021-11-28 DIAGNOSIS — M4722 Other spondylosis with radiculopathy, cervical region: Secondary | ICD-10-CM | POA: Diagnosis not present

## 2021-11-28 DIAGNOSIS — M5441 Lumbago with sciatica, right side: Secondary | ICD-10-CM | POA: Diagnosis not present

## 2021-11-28 MED ORDER — OXYCODONE-ACETAMINOPHEN 5-325 MG PO TABS
ORAL_TABLET | ORAL | 0 refills | Status: DC
  Filled 2021-11-28: qty 50, 13d supply, fill #0

## 2021-11-29 ENCOUNTER — Other Ambulatory Visit (HOSPITAL_COMMUNITY): Payer: Self-pay

## 2021-11-29 MED ORDER — IBUPROFEN 800 MG PO TABS
800.0000 mg | ORAL_TABLET | Freq: Three times a day (TID) | ORAL | 5 refills | Status: DC | PRN
  Filled 2021-11-29: qty 90, 30d supply, fill #0
  Filled 2021-12-27: qty 90, 30d supply, fill #1
  Filled 2022-01-30: qty 90, 30d supply, fill #2
  Filled 2022-03-19: qty 90, 30d supply, fill #3
  Filled 2022-05-14: qty 90, 30d supply, fill #4
  Filled 2022-06-25: qty 90, 30d supply, fill #5

## 2021-12-17 ENCOUNTER — Other Ambulatory Visit: Payer: Self-pay | Admitting: Internal Medicine

## 2021-12-17 ENCOUNTER — Other Ambulatory Visit: Payer: Self-pay | Admitting: Hematology and Oncology

## 2021-12-17 DIAGNOSIS — C50011 Malignant neoplasm of nipple and areola, right female breast: Secondary | ICD-10-CM

## 2021-12-17 DIAGNOSIS — G629 Polyneuropathy, unspecified: Secondary | ICD-10-CM

## 2021-12-18 ENCOUNTER — Other Ambulatory Visit (HOSPITAL_COMMUNITY): Payer: Self-pay

## 2021-12-18 MED ORDER — GABAPENTIN 300 MG PO CAPS
ORAL_CAPSULE | ORAL | 2 refills | Status: DC
  Filled 2021-12-18: qty 180, fill #0
  Filled 2021-12-25: qty 180, 30d supply, fill #0
  Filled 2022-01-30: qty 180, 30d supply, fill #1
  Filled 2022-03-02: qty 180, 30d supply, fill #2

## 2021-12-18 MED ORDER — MELOXICAM 15 MG PO TABS
15.0000 mg | ORAL_TABLET | Freq: Every day | ORAL | 0 refills | Status: DC
  Filled 2021-12-18: qty 30, 30d supply, fill #0

## 2021-12-19 DIAGNOSIS — M47816 Spondylosis without myelopathy or radiculopathy, lumbar region: Secondary | ICD-10-CM | POA: Diagnosis not present

## 2021-12-21 ENCOUNTER — Other Ambulatory Visit: Payer: Self-pay | Admitting: *Deleted

## 2021-12-21 NOTE — Progress Notes (Signed)
Currently a pt of Dr. Sharlet Salina.Marland KitchenJohny Chess

## 2021-12-25 ENCOUNTER — Other Ambulatory Visit (HOSPITAL_COMMUNITY): Payer: Self-pay

## 2021-12-26 ENCOUNTER — Other Ambulatory Visit (HOSPITAL_COMMUNITY): Payer: Self-pay

## 2021-12-27 ENCOUNTER — Other Ambulatory Visit (HOSPITAL_COMMUNITY): Payer: Self-pay

## 2022-01-09 ENCOUNTER — Other Ambulatory Visit (HOSPITAL_COMMUNITY): Payer: Self-pay

## 2022-01-09 MED ORDER — OXYCODONE-ACETAMINOPHEN 5-325 MG PO TABS
ORAL_TABLET | ORAL | 0 refills | Status: DC
  Filled 2022-01-09: qty 30, 10d supply, fill #0

## 2022-01-23 ENCOUNTER — Other Ambulatory Visit (HOSPITAL_COMMUNITY): Payer: Self-pay

## 2022-01-23 DIAGNOSIS — M5416 Radiculopathy, lumbar region: Secondary | ICD-10-CM | POA: Diagnosis not present

## 2022-01-23 DIAGNOSIS — M542 Cervicalgia: Secondary | ICD-10-CM | POA: Diagnosis not present

## 2022-01-23 DIAGNOSIS — M25572 Pain in left ankle and joints of left foot: Secondary | ICD-10-CM | POA: Diagnosis not present

## 2022-01-23 MED ORDER — OXYCODONE-ACETAMINOPHEN 5-325 MG PO TABS: ORAL_TABLET | ORAL | 0 refills | Status: DC

## 2022-01-24 ENCOUNTER — Other Ambulatory Visit: Payer: Self-pay | Admitting: Internal Medicine

## 2022-01-24 ENCOUNTER — Other Ambulatory Visit (HOSPITAL_COMMUNITY): Payer: Self-pay

## 2022-01-24 MED ORDER — MELOXICAM 15 MG PO TABS
15.0000 mg | ORAL_TABLET | Freq: Every day | ORAL | 2 refills | Status: DC
  Filled 2022-01-24: qty 30, 30d supply, fill #0

## 2022-01-30 ENCOUNTER — Other Ambulatory Visit (HOSPITAL_COMMUNITY): Payer: Self-pay

## 2022-02-02 ENCOUNTER — Other Ambulatory Visit: Payer: Self-pay | Admitting: Oncology

## 2022-02-02 ENCOUNTER — Other Ambulatory Visit (HOSPITAL_COMMUNITY): Payer: Self-pay

## 2022-02-07 ENCOUNTER — Other Ambulatory Visit (HOSPITAL_COMMUNITY): Payer: Self-pay

## 2022-02-07 ENCOUNTER — Telehealth: Payer: Self-pay | Admitting: *Deleted

## 2022-02-07 MED ORDER — POTASSIUM CHLORIDE CRYS ER 20 MEQ PO TBCR
20.0000 meq | EXTENDED_RELEASE_TABLET | Freq: Two times a day (BID) | ORAL | 4 refills | Status: DC
  Filled 2022-02-07: qty 90, 45d supply, fill #0
  Filled 2022-03-19: qty 90, 45d supply, fill #1
  Filled 2022-06-25: qty 90, 45d supply, fill #2
  Filled 2022-09-18: qty 90, 45d supply, fill #3
  Filled 2023-01-15: qty 90, 45d supply, fill #4

## 2022-02-07 NOTE — Telephone Encounter (Signed)
Pt under research study with last cmp showing low potassium

## 2022-02-07 NOTE — Telephone Encounter (Signed)
Foye Spurling, BSN, RN, [place] Nurse II 02/07/2022 4:51 PM

## 2022-02-08 ENCOUNTER — Telehealth: Payer: Self-pay | Admitting: Hematology and Oncology

## 2022-02-08 ENCOUNTER — Encounter: Payer: Self-pay | Admitting: Internal Medicine

## 2022-02-08 ENCOUNTER — Ambulatory Visit (INDEPENDENT_AMBULATORY_CARE_PROVIDER_SITE_OTHER): Payer: Medicare Other | Admitting: Podiatry

## 2022-02-08 ENCOUNTER — Ambulatory Visit (INDEPENDENT_AMBULATORY_CARE_PROVIDER_SITE_OTHER): Payer: Medicare Other

## 2022-02-08 ENCOUNTER — Other Ambulatory Visit: Payer: Self-pay | Admitting: Podiatry

## 2022-02-08 ENCOUNTER — Other Ambulatory Visit (HOSPITAL_COMMUNITY): Payer: Self-pay

## 2022-02-08 ENCOUNTER — Encounter: Payer: Self-pay | Admitting: Podiatry

## 2022-02-08 DIAGNOSIS — M25579 Pain in unspecified ankle and joints of unspecified foot: Secondary | ICD-10-CM | POA: Insufficient documentation

## 2022-02-08 DIAGNOSIS — Q666 Other congenital valgus deformities of feet: Secondary | ICD-10-CM | POA: Diagnosis not present

## 2022-02-08 DIAGNOSIS — M778 Other enthesopathies, not elsewhere classified: Secondary | ICD-10-CM

## 2022-02-08 DIAGNOSIS — M76822 Posterior tibial tendinitis, left leg: Secondary | ICD-10-CM | POA: Diagnosis not present

## 2022-02-08 MED ORDER — MELOXICAM 15 MG PO TABS
15.0000 mg | ORAL_TABLET | Freq: Every day | ORAL | 3 refills | Status: DC
  Filled 2022-02-08: qty 30, 30d supply, fill #0

## 2022-02-08 MED ORDER — DEXAMETHASONE SODIUM PHOSPHATE 120 MG/30ML IJ SOLN
2.0000 mg | Freq: Once | INTRAMUSCULAR | Status: AC
  Administered 2022-02-08: 2 mg via INTRA_ARTICULAR

## 2022-02-08 NOTE — Telephone Encounter (Signed)
Scheduled appointment per inbasket message. Patient aware.   ?

## 2022-02-08 NOTE — Telephone Encounter (Signed)
BWEL Sub-Study: Called patient to discuss the new optional sub study for BWEL, [id number], which asks patients to provide blood specimen and complete questionnaires at their year 5 or 6 visit. Patient is scheduled for year 6 visit on 03/16/2022. Participants must fast for at least 12 hours prior to blood collection. Informed patient there is a consent form we would need to review and she would need to sign prior to participating. Informed patient the sub study is completely voluntary.  Patient states she is interested in participating and okay for research nurse to email her a copy of the consent form so she can read it ahead of time.  Patient asks if her appointment can be moved to either Tuesday or Thursday as it is currently scheduled on a Friday. Informed patient research nurse will request appointment be moved and will email her a copy of the consent. Encouraged her to call back if any questions.  Foye Spurling, BSN, RN, [employer] operator II

## 2022-02-08 NOTE — Progress Notes (Signed)
Subjective:  Patient ID: Margaret [name], female    DOB: June 16, 1961,  MRN: [id number] HPI Chief Complaint  Patient presents with   Foot Pain    Medial foot/ankle left - aching, swelling x several months, walking a lot aggravates, some days can't bear weight, tried soaking, compression arch sock, does have pain meds for back, history of chemo-has pains intermittent throughout body   Diabetes    Last a1c was 8.1   New Patient (Initial Visit)    61 y.o. female presents with the above complaint.   ROS: Denies fever chills nausea vomiting muscle aches pains calf pain back pain chest pain shortness of breath.  Past Medical History:  Diagnosis Date   Allergy    Anxiety    Arthritis    Bilateral breast cancer ([place])    Breast cancer ([place])    Breast cancer of upper-outer quadrant of left female breast ([place]) 04/21/2015   Depression    Diabetes mellitus without complication ([place])    GERD (gastroesophageal reflux disease)    Hypertension    Past Surgical History:  Procedure Laterality Date   ABDOMINAL HYSTERECTOMY  1990   MASTECTOMY MODIFIED RADICAL Left 05/09/2015   MASTECTOMY MODIFIED RADICAL Left 05/09/2015   Procedure: LEFT MODIFIED RADICAL MASTETCTOMY;  Surgeon: Autumn Messing III, MD;  Location: [facility];  Service: General;  Laterality: Left;   MASTECTOMY W/ SENTINEL NODE BIOPSY Right    MASTECTOMY W/ SENTINEL NODE BIOPSY Right 05/09/2015   Procedure: RIGHT MASTECTOMY WITH RIGHT AXILLARY SENTINEL LYMPH NODE BIOPSY;  Surgeon: Autumn Messing III, MD;  Location: [facility];  Service: General;  Laterality: Right;   PORTACATH PLACEMENT Right 09/01/2015   Procedure: INSERTION PORT-A-CATH;  Surgeon: Autumn Messing III, MD;  Location: [facility];  Service: General;  Laterality: Right;   TUBAL LIGATION  1984    Current Outpatient Medications:    meloxicam (MOBIC) 15 MG tablet, Take 1 tablet (15 mg total) by mouth daily., Disp: 30 tablet, Rfl: 3   atorvastatin (LIPITOR) 20 MG tablet, TAKE 1 TABLET  BY MOUTH ONCE A DAY, Disp: 90 tablet, Rfl: 3   baclofen (LIORESAL) 20 MG tablet, TAKE 1 TABLET BY MOUTH 3 TIMES DAILY, Disp: 90 tablet, Rfl: 5   Blood Glucose Monitoring Suppl (BLOOD GLUCOSE MONITOR SYSTEM) w/Device KIT, Use to test blood sugar 3 times a day, Disp: 1 kit, Rfl: 0   busPIRone (BUSPAR) 5 MG tablet, TAKE 1 TABLET BY MOUTH ONCE A DAY AS NEEDED FOR ANXIETY, Disp: 30 tablet, Rfl: 2   calcium-vitamin D (OSCAL WITH D) 500-200 MG-UNIT tablet, Take 1 tablet by mouth 2 (two) times daily., Disp: 60 tablet, Rfl: 0   diphenhydrAMINE (BENADRYL) 25 mg capsule, Take 1 capsule (25 mg total) by mouth every 6 (six) hours as needed. (Patient taking differently: Take 25 mg by mouth every 6 (six) hours as needed for allergies.), Disp: 100 capsule, Rfl: 3   gabapentin (NEURONTIN) 300 MG capsule, TAKE 2 CAPSULES BY MOUTH 3 TIMES DAILY, Disp: 180 capsule, Rfl: 2   glimepiride (AMARYL) 4 MG tablet, Take 1 tablet (4 mg total) by mouth daily before breakfast., Disp: 90 tablet, Rfl: 3   glucose blood test strip, Use to test blood sugar 3 times a day, Disp: 100 each, Rfl: 0   HYDROcodone-acetaminophen (NORCO/VICODIN) 5-325 MG tablet, Take 1 tablet by mouth every 6 hours as needed for pain (Patient not taking: Reported on 11/02/2021), Disp: 30 tablet, Rfl: 0   ibuprofen (ADVIL) 800 MG tablet, Take  1 tablet (800 mg total) by mouth every 8 (eight) hours as needed., Disp: 90 tablet, Rfl: 5   Lancets (ONETOUCH DELICA PLUS [id number]) MISC, Use to test blood sugar 3 times a day, Disp: 100 each, Rfl: 0   Multiple Vitamins-Minerals (CENTRUM SILVER 50+WOMEN PO), Take 1 tablet by mouth daily., Disp: , Rfl:    Olmesartan-amLODIPine-HCTZ 20-5-12.5 MG TABS, TAKE 1 TABLET BY MOUTH ONCE A DAY. REPLACES AMLODIPINE AND LOSARTAN/HCTZ, Disp: 90 tablet, Rfl: 4   oxyCODONE-acetaminophen (PERCOCET/ROXICET) 5-325 MG tablet, Take 1 tablet by mouth every 8 - 12 hours as needed for severe pain only (DNF 02/07/22), Disp: 30 tablet, Rfl: 0    pantoprazole (PROTONIX) 40 MG tablet, TAKE 1 TABLET BY MOUTH ONCE DAILY, Disp: 30 tablet, Rfl: 2   potassium chloride SA (KLOR-CON M) 20 MEQ tablet, Take 1 tablet by mouth 2 times daily., Disp: 90 tablet, Rfl: 4   predniSONE (DELTASONE) 20 MG tablet, Take 2 tablets (40 mg total) by mouth daily with breakfast., Disp: 10 tablet, Rfl: 0   traZODone (DESYREL) 50 MG tablet, TAKE 1 TABLET BY MOUTH AT BEDTIME. 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  General: Well developed, nourished, in no acute distress, alert and oriented x3   Dermatological: Skin is warm, dry and supple bilateral. Nails x 10 are well maintained; remaining integument appears unremarkable at this time. There are no open sores, no preulcerative lesions, no rash or signs of infection present.  Vascular: Dorsalis Pedis artery and Posterior Tibial artery pedal pulses are 2/4 bilateral with immedate capillary fill time. Pedal hair growth present. No varicosities and no lower extremity edema present bilateral.   Neruologic: Grossly intact via light touch bilateral. Vibratory intact via tuning fork bilateral. Protective threshold with Semmes Wienstein monofilament intact to all pedal sites bilateral. Patellar and Achilles deep tendon reflexes 2+ bilateral. No Babinski or clonus noted bilateral.   Musculoskeletal: No gross boney pedal deformities bilateral. No pain, crepitus, or limitation noted  with foot and ankle range of motion bilateral. Muscular strength 5/5 in all groups tested bilateral.  She has severe tenderness on palpation of the posterior tibial tendon with limited inversion against resistance.  She has mild edema.  The foot is in valgus position.    Gait: Unassisted, Nonantalgic.    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  Plan: Started her on meloxicam and I put a subcutaneous injection of Kenalog and local anesthetic in the area not injecting into the tendon itself.  Also placed her in a cam boot and discussed appropriate icing therapy.  Follow-up with  her in 1 month at which time if not improved MRI will be necessary.     Yorley Buch T. [place], [place]

## 2022-02-12 ENCOUNTER — Other Ambulatory Visit (HOSPITAL_COMMUNITY): Payer: Self-pay

## 2022-02-13 ENCOUNTER — Telehealth: Payer: Self-pay | Admitting: *Deleted

## 2022-02-13 ENCOUNTER — Other Ambulatory Visit (HOSPITAL_COMMUNITY): Payer: Self-pay

## 2022-02-13 MED ORDER — MELOXICAM 15 MG PO TABS
15.0000 mg | ORAL_TABLET | Freq: Every day | ORAL | 3 refills | Status: DC
  Filled 2022-02-13: qty 30, 30d supply, fill #0
  Filled 2022-03-19: qty 30, 30d supply, fill #1
  Filled 2022-04-17: qty 30, 30d supply, fill #2
  Filled 2022-05-14: qty 30, 30d supply, fill #3

## 2022-02-13 NOTE — Telephone Encounter (Signed)
Patient is calling for status of a medication that was supposed to be sent to pharmacy, not there.   Resent to pharmacy and informed patient, said that she will check on it tomorrow.

## 2022-02-16 ENCOUNTER — Other Ambulatory Visit (HOSPITAL_COMMUNITY): Payer: Self-pay

## 2022-02-19 DIAGNOSIS — H35033 Hypertensive retinopathy, bilateral: Secondary | ICD-10-CM | POA: Diagnosis not present

## 2022-02-19 DIAGNOSIS — H25813 Combined forms of age-related cataract, bilateral: Secondary | ICD-10-CM | POA: Diagnosis not present

## 2022-02-19 DIAGNOSIS — E119 Type 2 diabetes mellitus without complications: Secondary | ICD-10-CM | POA: Diagnosis not present

## 2022-02-19 DIAGNOSIS — I10 Essential (primary) hypertension: Secondary | ICD-10-CM | POA: Diagnosis not present

## 2022-02-21 ENCOUNTER — Other Ambulatory Visit (HOSPITAL_COMMUNITY): Payer: Self-pay

## 2022-03-02 ENCOUNTER — Other Ambulatory Visit: Payer: Self-pay | Admitting: Hematology and Oncology

## 2022-03-02 ENCOUNTER — Other Ambulatory Visit (HOSPITAL_COMMUNITY): Payer: Self-pay

## 2022-03-02 DIAGNOSIS — Z170 Estrogen receptor positive status [ER+]: Secondary | ICD-10-CM

## 2022-03-02 MED ORDER — PANTOPRAZOLE SODIUM 40 MG PO TBEC
DELAYED_RELEASE_TABLET | Freq: Every day | ORAL | 2 refills | Status: DC
  Filled 2022-03-02: qty 30, 30d supply, fill #0
  Filled 2022-04-06: qty 30, 30d supply, fill #1
  Filled 2022-05-14: qty 30, 30d supply, fill #2

## 2022-03-03 ENCOUNTER — Other Ambulatory Visit (HOSPITAL_COMMUNITY): Payer: Self-pay

## 2022-03-08 ENCOUNTER — Telehealth: Payer: Self-pay | Admitting: *Deleted

## 2022-03-08 DIAGNOSIS — C50412 Malignant neoplasm of upper-outer quadrant of left female breast: Secondary | ICD-10-CM

## 2022-03-08 NOTE — Telephone Encounter (Signed)
Phone encounter opened in error

## 2022-03-14 ENCOUNTER — Telehealth: Payer: Self-pay | Admitting: *Deleted

## 2022-03-14 NOTE — Telephone Encounter (Signed)
BWEL and [facility] Studies:  Patient confirmed her appointments for tomorrow starting at noon to meet with research nurse. Reminded patient that we will review new BWEL sub-study which includes research labs and questionnaires. After she consents, then she would have research labs drawn prior to seeing Dr. Chryl Heck. Reminded patient she would need to fast for at least 12 hrs prior to the research labs. She can drink water.  Patient confirmed appointments, states she is still interested in participating in the new BWEL sub study and she verbalized understanding to fast x 12 hrs.  Foye Spurling, BSN, RN, [employer] Research Nurse II 03/14/2022 10:24 AM

## 2022-03-15 ENCOUNTER — Inpatient Hospital Stay: Payer: Medicare Other | Attending: Hematology and Oncology

## 2022-03-15 ENCOUNTER — Inpatient Hospital Stay: Payer: Medicare Other | Admitting: *Deleted

## 2022-03-15 ENCOUNTER — Encounter: Payer: Self-pay | Admitting: Hematology and Oncology

## 2022-03-15 ENCOUNTER — Other Ambulatory Visit: Payer: Self-pay

## 2022-03-15 ENCOUNTER — Inpatient Hospital Stay (HOSPITAL_BASED_OUTPATIENT_CLINIC_OR_DEPARTMENT_OTHER): Payer: Medicare Other | Admitting: Hematology and Oncology

## 2022-03-15 VITALS — Wt 251.6 lb

## 2022-03-15 VITALS — BP 165/75 | HR 72 | Temp 97.9°F | Ht 68.5 in | Wt 251.6 lb

## 2022-03-15 DIAGNOSIS — I890 Lymphedema, not elsewhere classified: Secondary | ICD-10-CM | POA: Diagnosis not present

## 2022-03-15 DIAGNOSIS — Z79899 Other long term (current) drug therapy: Secondary | ICD-10-CM | POA: Diagnosis not present

## 2022-03-15 DIAGNOSIS — Z170 Estrogen receptor positive status [ER+]: Secondary | ICD-10-CM | POA: Diagnosis not present

## 2022-03-15 DIAGNOSIS — C50412 Malignant neoplasm of upper-outer quadrant of left female breast: Secondary | ICD-10-CM

## 2022-03-15 DIAGNOSIS — Z853 Personal history of malignant neoplasm of breast: Secondary | ICD-10-CM | POA: Diagnosis not present

## 2022-03-15 DIAGNOSIS — Z923 Personal history of irradiation: Secondary | ICD-10-CM | POA: Diagnosis not present

## 2022-03-15 DIAGNOSIS — Z006 Encounter for examination for normal comparison and control in clinical research program: Secondary | ICD-10-CM | POA: Diagnosis not present

## 2022-03-15 DIAGNOSIS — Z9013 Acquired absence of bilateral breasts and nipples: Secondary | ICD-10-CM | POA: Insufficient documentation

## 2022-03-15 LAB — CMP (CANCER CENTER ONLY)
ALT: 20 U/L (ref 0–44)
AST: 15 U/L (ref 15–41)
Albumin: 4.5 g/dL (ref 3.5–5.0)
Alkaline Phosphatase: 111 U/L (ref 38–126)
Anion gap: 6 (ref 5–15)
BUN: 16 mg/dL (ref 6–20)
CO2: 29 mmol/L (ref 22–32)
Calcium: 9.6 mg/dL (ref 8.9–10.3)
Chloride: 105 mmol/L (ref 98–111)
Creatinine: 0.81 mg/dL (ref 0.44–1.00)
GFR, Estimated: >60 mL/min (ref >60)
Glucose, Bld: 104 mg/dL — ABNORMAL HIGH (ref 70–99)
Potassium: 3.9 mmol/L (ref 3.5–5.1)
Sodium: 140 mmol/L (ref 135–145)
Total Bilirubin: 0.3 mg/dL (ref 0.3–1.2)
Total Protein: 8.5 g/dL — ABNORMAL HIGH (ref 6.5–8.1)

## 2022-03-15 LAB — RESEARCH LABS

## 2022-03-15 LAB — CBC WITH DIFFERENTIAL (CANCER CENTER ONLY)
Abs Immature Granulocytes: 0.04 10*3/uL (ref 0.00–0.07)
Basophils Absolute: 0.1 10*3/uL (ref 0.0–0.1)
Basophils Relative: 1 %
Eosinophils Absolute: 0.2 10*3/uL (ref 0.0–0.5)
Eosinophils Relative: 2 %
HCT: 44.8 % (ref 36.0–46.0)
Hemoglobin: 15 g/dL (ref 12.0–15.0)
Immature Granulocytes: 0 %
Lymphocytes Relative: 27 %
Lymphs Abs: 3 10*3/uL (ref 0.7–4.0)
MCH: 30.2 pg (ref 26.0–34.0)
MCHC: 33.5 g/dL (ref 30.0–36.0)
MCV: 90.3 fL (ref 80.0–100.0)
Monocytes Absolute: 1.4 10*3/uL — ABNORMAL HIGH (ref 0.1–1.0)
Monocytes Relative: 13 %
Neutro Abs: 6.3 10*3/uL (ref 1.7–7.7)
Neutrophils Relative %: 57 %
Platelet Count: 215 10*3/uL (ref 150–400)
RBC: 4.96 MIL/uL (ref 3.87–5.11)
RDW: 13.4 % (ref 11.5–15.5)
WBC Count: 11.1 10*3/uL — ABNORMAL HIGH (ref 4.0–10.5)
nRBC: 0 % (ref 0.0–0.2)

## 2022-03-15 NOTE — Research (Signed)
03/15/2022   AFT-05 PALLAS - Follow-Up Phase I Month 72  Patient into clinic today unaccompanied for routine follow-up visit.  Research Labs- none required this visit.   Anti-cancer medications - Patient states she is not on any new anti-cancer medications. She stopped Anastrozole August 2022 after her last study visit with Dr. Jana Hakim.    Serious adverse events - Patient denies any new, serious health problems or hospitalizations since her last visit.    Disease monitoring - See MD note.  COVID-19 - She denies any COVID-19 infection since last visit.  Plan- Patient will continue in Follow Up Phase 2 on study with annual follow-up.  This will be done in conjunction with the BWEL study annual visits. Patient agreed with this plan. Thanked patient for her participation in this research study. Encouraged her to call research nurse if any questions/concerns prior to next visit. She verbalized understanding.  Foye Spurling, BSN, RN Clinical Research Nurse 03/15/2022

## 2022-03-15 NOTE — Progress Notes (Signed)
[place]  Telephone:(336) [phone] Fax:(336) [phone]   ID: HIBBA SCHRAM DOB: 1961/07/25  MR#: [id number]  WNU#:[id number]  Patient Care Team: Hoyt Koch, MD as PCP - General (Internal Medicine) Jovita Kussmaul, MD as Consulting Physician (General Surgery) Gery Pray, MD as Consulting Physician (Radiation Oncology) Mauro Kaufmann, RN as Registered Nurse Rockwell Germany, RN as Registered Nurse Rosemarie Ax, MD as Consulting Physician (Family Medicine) Charlton Haws, [facility] as Pharmacist (Pharmacist) Benay Pike, MD as Consulting Physician (Hematology and Oncology) OTHER MD:   CHIEF COMPLAINT:  estrogen receptor positive breast cancer; (s/p bilateral mastectomies)  CURRENT TREATMENT: completing 5 years of anastrozole  INTERVAL HISTORY: Margaret [name] returns today for follow-up of her estrogen receptor positive breast cancer.  She is here for an annual visit, she participated in the [place] trial and received 2 years of adjuvant palbociclib with 5 years of anastrozole. She is status post bilateral mastectomies, so does not require annual mammography.  She continues to complain of pain in the area of mastectomy and ongoing severe peripheral neuropathy.  She takes gabapentin for the peripheral neuropathy.  She otherwise denies any new complaints. Rest of the pertinent 10 point ROS reviewed and negative  COVID 19 VACCINATION STATUS: [place] x3, most recently 07/2020   BREAST CANCER HISTORY: From the original intake note:  Margaret [name]'s primary care physician retired sometime ago and her health maintenance has not been up-to-date. She has been receiving medical care through the emergency room and was seen in March 2015 following a head injury, then in August 2015 for lancing of an abscess. In December 2015 she noticed that her left breast looked a little bit smaller than her right. She brought this to the attention of friends and family over the next several  months but the general feeling was that most people are not perfectly symmetrical. By the summer of this year she noticed that her nipple on the left was "going in". More recently she started developing pain in the left breast. She was evaluated for this in the emergency room 04/09/2015 at which time a left breast exam showed a deformed left breast with a large hard mass encompassing most of the breast, with nipple retraction. There was no nipple discharge or bleeding and no palpable adenopathy.  The patient was referred to [facility] and on 04/13/2015 she underwent bilateral diagnostic mammography with tomosynthesis as well as bilateral breast ultrasound. The breast density was category B. In the right breast at the 11:00 position there was a 1.3 cm irregular mass which by ultrasonography measured 1.3 cm.--In the left breast there was a 4 cm irregular mass at the 2:00 position associated with nipple retraction. There was a second, 1.7 cm area of architectural distortion at the 9:00 position. Both were palpable. By ultrasonography, the 4 cm irregular mass was noted, with a second mass measuring 2.5 cm by ultrasonography. Both axillae were benign.  On 04/19/2015 the patient underwent right breast upper outer quadrant biopsy, showing ([id number]) and invasive ductal carcinoma, grade 1, estrogen receptor 90% positive, progesterone receptor negative, with an MIB-1 of 5%, and no HER-2 amplification, the signals ratio being 1.26 and the number per cell 2.20.  On the same day, she underwent biopsy of the 2 left breast masses in question as well as a suspicious left axillary lymph node. All 3 biopsies showed invasive ductal carcinoma, grade 2, estrogen receptor 80-100% positive, progesterone receptor 80-100% positive, with the MIB-1 between 5 and 10%, and no HER-2  amplification, the signals ratio being between 1.05 and 1.13, and the number per cell between 2.10 and 2.25.  The patient's subsequent history is as detailed  below   PAST MEDICAL HISTORY: Past Medical History:  Diagnosis Date   Allergy    Anxiety    Arthritis    Bilateral breast cancer (HCC)    Breast cancer (HCC)    Breast cancer of upper-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) 04/21/2015   Depression    Diabetes mellitus without complication (HCC)    GERD (gastroesophageal reflux disease)    Hypertension     PAST SURGICAL HISTORY: Past Surgical History:  Procedure Laterality Date   ABDOMINAL HYSTERECTOMY  1990   MASTECTOMY MODIFIED RADICAL Left 05/09/2015   MASTECTOMY MODIFIED RADICAL Left 05/09/2015   Procedure: LEFT MODIFIED RADICAL MASTETCTOMY;  Surgeon: Chevis Pretty III, MD;  Location: [facility] OR;  Service: General;  Laterality: Left;   MASTECTOMY W/ SENTINEL NODE BIOPSY Right    MASTECTOMY W/ SENTINEL NODE BIOPSY Right 05/09/2015   Procedure: RIGHT MASTECTOMY WITH RIGHT AXILLARY SENTINEL LYMPH NODE BIOPSY;  Surgeon: Chevis Pretty III, MD;  Location: [facility] OR;  Service: General;  Laterality: Right;   PORTACATH PLACEMENT Right 09/01/2015   Procedure: INSERTION PORT-A-CATH;  Surgeon: Chevis Pretty III, MD;  Location: [facility];  Service: General;  Laterality: Right;   TUBAL LIGATION  1984    FAMILY HISTORY Family History  Problem Relation Age of Onset   Hypertension Mother    Diabetes Mother    Colon cancer Neg Hx    Esophageal cancer Neg Hx    Rectal cancer Neg Hx    Stomach cancer Neg Hx     the patient has little information on her father. Her mother is living, currently age 13. She has one brother, 2 sisters. There is no history of breast or ovarian cancer in the family to her knowledge.    GYNECOLOGIC HISTORY:  No LMP recorded. Patient has had a hysterectomy.  menarche age 35, first live birth age 44. She is GX P4. She underwent a total abdominal hysterectomy with bilateral salpingo-oophorectomy at age 77. She did not take hormone replacement. She never used oral contraceptives.    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 She previously worked the same position at [employer] for 25+ years. She is single and lives alone  The patient's daughter, Eloisa Northern, works as a Water engineer, as does her daughter Bufford Lope. Daughter Charise Killian works at [employer], as a call. All 3 live in [place]. The patient's son Derryl Harbor Ellegood's lives in [place]. He prepares sets for shows The patient has 4 grandchildren, no great grandchildren. She attends a [employer] in [place].    ADVANCED DIRECTIVES: Not in place. At the initial clinic visit the patient was given the appropriate forms to complete and notarize at her discretion. The patient intends to name her daughter Manuela Neptune as healthcare power of attorney. She can be reached at [phone].   HEALTH MAINTENANCE: Social History   Tobacco Use   Smoking status: Former    Packs/day: 0.10    Years: 28.00    Total pack years: 2.80    Types: Cigarettes    Quit date: 09/02/2018    Years since quitting: 3.5   Smokeless tobacco: Never  Vaping Use   Vaping Use: Never used  Substance Use Topics  Alcohol use: Yes    Alcohol/week: 0.0 standard drinks of alcohol    Comment: Socially   Drug use: No     Colonoscopy: Never  PAP: Status post hysterectomy  Bone density: Never  Lipid panel:  Allergies  Allergen Reactions   Banana Hives    Tongue itching   Latex Itching and Other (See Comments)    burning   Peanuts [Peanut Oil] Hives    Patient is allergic to all tree nuts   Wheat Bran Hives    Current Outpatient Medications  Medication Sig Dispense Refill   atorvastatin (LIPITOR) 20 MG tablet TAKE 1 TABLET BY MOUTH ONCE A DAY 90 tablet 3   baclofen (LIORESAL) 20 MG tablet TAKE 1 TABLET BY MOUTH 3 TIMES DAILY 90 tablet 5   Blood Glucose Monitoring Suppl (BLOOD GLUCOSE MONITOR SYSTEM) w/Device KIT Use to test blood sugar 3 times a day  1 kit 0   busPIRone (BUSPAR) 5 MG tablet TAKE 1 TABLET BY MOUTH ONCE A DAY AS NEEDED FOR ANXIETY 30 tablet 2   calcium-vitamin D (OSCAL WITH D) 500-200 MG-UNIT tablet Take 1 tablet by mouth 2 (two) times daily. 60 tablet 0   diphenhydrAMINE (BENADRYL) 25 mg capsule Take 1 capsule (25 mg total) by mouth every 6 (six) hours as needed. (Patient taking differently: Take 25 mg by mouth every 6 (six) hours as needed for allergies.) 100 capsule 3   gabapentin (NEURONTIN) 300 MG capsule TAKE 2 CAPSULES BY MOUTH 3 TIMES DAILY 180 capsule 2   glimepiride (AMARYL) 4 MG tablet Take 1 tablet (4 mg total) by mouth daily before breakfast. 90 tablet 3   glucose blood test strip Use to test blood sugar 3 times a day 100 each 0   HYDROcodone-acetaminophen (NORCO/VICODIN) 5-325 MG tablet Take 1 tablet by mouth every 6 hours as needed for pain (Patient not taking: Reported on 11/02/2021) 30 tablet 0   ibuprofen (ADVIL) 800 MG tablet Take 1 tablet (800 mg total) by mouth every 8 (eight) hours as needed. 90 tablet 5   Lancets (ONETOUCH DELICA PLUS [id number]) MISC Use to test blood sugar 3 times a day 100 each 0   meloxicam (MOBIC) 15 MG tablet Take 1 tablet (15 mg total) by mouth daily. 30 tablet 3   Multiple Vitamins-Minerals (CENTRUM SILVER 50+WOMEN PO) Take 1 tablet by mouth daily.     Olmesartan-amLODIPine-HCTZ 20-5-12.5 MG TABS TAKE 1 TABLET BY MOUTH ONCE A DAY. REPLACES AMLODIPINE AND LOSARTAN/HCTZ 90 tablet 4   oxyCODONE-acetaminophen (PERCOCET/ROXICET) 5-325 MG tablet Take 1 tablet by mouth every 8 - 12 hours as needed for severe pain only (DNF 02/07/22) 30 tablet 0   pantoprazole (PROTONIX) 40 MG tablet TAKE 1 TABLET BY MOUTH ONCE DAILY 30 tablet 2   potassium chloride SA (KLOR-CON M) 20 MEQ tablet Take 1 tablet by mouth 2 times daily. 90 tablet 4   predniSONE (DELTASONE) 20 MG tablet Take 2 tablets (40 mg total) by mouth daily with breakfast. 10 tablet 0   traZODone (DESYREL) 50 MG tablet TAKE 1 TABLET BY MOUTH AT  BEDTIME. MAY INCREASE TO 2 TABLETS AFTER FEW DAYS IF NEEDED 60 tablet 6   valACYclovir (VALTREX) 1000 MG tablet TAKE 1 TABLET BY MOUTH TWICE DAILY 90 tablet 4   venlafaxine XR (EFFEXOR-XR) 150 MG 24 hr capsule TAKE 1 CAPSULE BY MOUTH ONCE DAILY WITH BREAKFAST 90 capsule 3   No current facility-administered medications for this visit.     OBJECTIVE:  African-American woman who  appears stated age  22:   03/15/22 1318  BP: (!) 165/75  Pulse: 72  Temp: 97.9 F (36.6 C)  SpO2: 98%   Wt Readings from Last 3 Encounters:  03/15/22 251 lb 9.6 oz (114.1 kg)  11/02/21 251 lb 9.6 oz (114.1 kg)  03/28/21 252 lb 9.6 oz (114.6 kg)   Body mass index is 37.7 kg/m.    ECOG FS:1 - Symptomatic but completely ambulatory  Physical Exam Constitutional:      Appearance: Normal appearance.  Cardiovascular:     Rate and Rhythm: Normal rate and regular rhythm.  Pulmonary:     Effort: Pulmonary effort is normal.     Breath sounds: Normal breath sounds.  Chest:     Comments: She is status post bilateral mastectomy.  No concern for recurrence. Musculoskeletal:        General: No swelling.     Cervical back: Normal range of motion and neck supple. No rigidity.  Lymphadenopathy:     Cervical: No cervical adenopathy.  Neurological:     Mental Status: She is alert.       LAB RESULTS:  CMP     Component Value Date/Time   NA 142 03/14/2021 1140   NA 142 07/24/2017 1407   K 3.2 (L) 03/14/2021 1140   K 3.3 (L) 07/24/2017 1407   CL 107 03/14/2021 1140   CO2 26 03/14/2021 1140   CO2 26 07/24/2017 1407   GLUCOSE 174 (H) 03/14/2021 1140   GLUCOSE 110 07/24/2017 1407   BUN 18 03/14/2021 1140   BUN 13.4 07/24/2017 1407   CREATININE 0.81 03/14/2021 1140   CREATININE 0.83 03/10/2020 0919   CREATININE 1.0 07/24/2017 1407   CALCIUM 9.5 03/14/2021 1140   CALCIUM 9.6 07/24/2017 1407   PROT 7.7 03/14/2021 1140   PROT 8.5 (H) 07/24/2017 1407   ALBUMIN 3.8 03/14/2021 1140   ALBUMIN 3.9  07/24/2017 1407   AST 21 03/14/2021 1140   AST 18 03/10/2020 0919   AST 23 07/24/2017 1407   ALT 32 03/14/2021 1140   ALT 30 03/10/2020 0919   ALT 28 07/24/2017 1407   ALKPHOS 132 (H) 03/14/2021 1140   ALKPHOS 84 07/24/2017 1407   BILITOT 0.3 03/14/2021 1140   BILITOT <0.2 (L) 03/10/2020 0919   BILITOT 0.30 07/24/2017 1407   GFRNONAA >60 03/14/2021 1140   GFRNONAA >60 03/10/2020 0919   GFRAA >60 03/10/2020 0919    INo results found for: "SPEP", "UPEP"  Lab Results  Component Value Date   WBC 11.1 (H) 03/15/2022   NEUTROABS 6.3 03/15/2022   HGB 15.0 03/15/2022   HCT 44.8 03/15/2022   MCV 90.3 03/15/2022   PLT 215 03/15/2022      Chemistry      Component Value Date/Time   NA 142 03/14/2021 1140   NA 142 07/24/2017 1407   K 3.2 (L) 03/14/2021 1140   K 3.3 (L) 07/24/2017 1407   CL 107 03/14/2021 1140   CO2 26 03/14/2021 1140   CO2 26 07/24/2017 1407   BUN 18 03/14/2021 1140   BUN 13.4 07/24/2017 1407   CREATININE 0.81 03/14/2021 1140   CREATININE 0.83 03/10/2020 0919   CREATININE 1.0 07/24/2017 1407   GLU 157 (H) 08/17/2016 0818      Component Value Date/Time   CALCIUM 9.5 03/14/2021 1140   CALCIUM 9.6 07/24/2017 1407   ALKPHOS 132 (H) 03/14/2021 1140   ALKPHOS 84 07/24/2017 1407   AST 21 03/14/2021 1140   AST 18 03/10/2020  0919   AST 23 07/24/2017 1407   ALT 32 03/14/2021 1140   ALT 30 03/10/2020 0919   ALT 28 07/24/2017 1407   BILITOT 0.3 03/14/2021 1140   BILITOT <0.2 (L) 03/10/2020 0919   BILITOT 0.30 07/24/2017 1407       No results found for: "LABCA2"  No components found for: "LABCA125"  No results for input(s): "INR" in the last 168 hours.  Urinalysis    Component Value Date/Time   COLORURINE YELLOW 06/29/2015 1030   APPEARANCEUR CLOUDY (A) 06/29/2015 1030   LABSPEC 1.019 06/29/2015 1030   PHURINE 7.0 06/29/2015 1030   GLUCOSEU NEGATIVE 06/29/2015 1030   HGBUR TRACE (A) 06/29/2015 1030   BILIRUBINUR NEGATIVE 06/29/2015 1030    KETONESUR NEGATIVE 06/29/2015 1030   PROTEINUR NEGATIVE 06/29/2015 1030   NITRITE NEGATIVE 06/29/2015 1030   LEUKOCYTESUR MODERATE (A) 06/29/2015 1030    STUDIES: No results found.   ASSESSMENT: 61 y.o. [place] woman with synchronous breast cancers, as follows  (a) status post right breast upper outer quadrant biopsy 04/19/2015 for a clinical pT1c N0, stage IA invasive ductal carcinoma, grade 1, estrogen receptor positive, progesterone receptor negative, with an MIB-1 of 5%, and no HER-2 amplification  (b) status post left breast upper outer quadrant biopsy 2 and left axillary lymph node biopsy 04/19/2015 for a clinical T3 N1, stage IIIA invasive ductal carcinoma, grade 1 or 2, estrogen receptor and progesterone receptor positive, HER-2 negative, with an MIB-1 between 5 and 10%  (1) status post bilateral mastectomies 05/09/2015, showing:   (a) on the right side, a pT2 pN0, stage IIA invasive ductal carcinoma, with negative margins and repeat HER-2 again negative    (b) on the left, a pT3 pN2, stage IIIA invasive ductal carcinoma, grade 2, with negative margins, and repeat HER-2 again negative  (2) Mammaprint from the left-sided tumor returned "luminal type, low risk", predicting a small benefit from chemotherapy with the important caveat that N2 disease was not included in the MINDACT study--patient opted for chemotherapy  (3) Oncotype from the right-sided tumor showed a score of 12, predicting an 8% risk of recurrence outside the breast within the next 10 years, if the patient's only systemic therapy was tamoxifen for 5 years. It also predicted no significant benefit from chemotherapy  (4) postmastectomy radiation to the left chest wall completed 08/15/2015  (5) adjuvant chemotherapy consisting of cyclophosphamide and docetaxel x4 completed on 11/21/15.  (5) anastrozole started 02/08/2016, completing 5 years August 2022  (6) left upper extremity lymphedema  (a) negative Doppler  study 11/02/2015  (7) enrolled in B-WELL study 02/08/2016, follow-up for 10 years planned  (8) participating in [place] trial as of August 2017, 10-year follow-up planned  (a) randomized to palbociclib, starting October 2017  (b) completed 2 years of palbociclib October 2019, with no dose reductions necessary   PLAN:   She is here for an annual follow-up.  No concern for recurrence.  Physical examination without any remarkable findings. With regards to her pain in the mastectomy scar, she was encouraged to try some capsaicin cream.  She is already on gabapentin for neuropathic pain in her feet. She had real difficulty tolerating anastrozole even on venlafaxine hence only took it for 5 years. CBC from today reviewed, no major concerns, rest of the labs pending at the time of my visit. She will return to clinic in 1 year or sooner as needed.  No role for mammograms, she had bilateral mastectomy.  Total time spent: 30 minutes *Total  Encounter Time as defined by the Centers for Medicare and Medicaid Services includes, in addition to the face-to-face time of a patient visit (documented in the note above) non-face-to-face time: obtaining and reviewing outside history, ordering and reviewing medications, tests or procedures, care coordination (communications with other health care professionals or caregivers) and documentation in the medical record.

## 2022-03-15 NOTE — Research (Addendum)
[facility] [id number] BWEL - Month 72 visit  Patient into clinic today unaccompanied for routine follow-up visit.    Consent Addendum: Optional Long-Term Outcomes Substudy ([id number]):  This Clinical Research Nurse met with KATOYA OBLANDER, [id number] on 03/15/22 in a manner and location that ensures patient privacy to discuss participation in the above listed research study.  Patient is Unaccompanied.     As outlined in the informed consent form, this Nurse and [name] discussed the purpose of the research sub-study, the investigational nature of the sub-study, study procedures and requirements for study participation, potential risks and benefits of study participation, as well as alternatives to participation. The patient understands participation is voluntary.  Confidentiality and how the patient's information will be used as part of study participation were discussed.  Patient was informed there is not reimbursement provided for their time and effort spent on trial participation.   All questions were answered to patient's satisfaction.  The informed consent was reviewed page by page.  The patient's mental and emotional status is appropriate to provide informed consent, and the patient verbalizes an understanding of study participation.  Patient has agreed to participate in the above listed research BWEL Sub-study and has voluntarily signed the informed consent protocol version 11/23/20, [place] Active 03/23/2021 on 03/15/22 at 11:53AM.  The patient was provided with a copy of the signed informed consent form. for their reference.  No study specific procedures were obtained prior to the signing of the informed consent document.  Approximately 15 minutes were spent with the patient reviewing the informed consent documents.     ST2 Sub-study Questionnaires and Follow-Up Questionnaire: Completed independently by patient after signing consent addendum and prior to lab/MD appointments. Collected  and checked for completeness and accuracy. On the Follow Up Questionnaire under Medications patient checked both "No" and "don't know" for Metformin. She clarified her answer is "don't know."  She also checked "No" and "don't know" for Insulin and says her answer should be "No." She corrected this on the form.   ST2 Research samples: Blood collected after consent.  Patient confirmed she has been fasting for at least 12 hrs prior to the research labs.    Disease status/outcome assessment - See MD note.  Weight and waist and hip circumference measurements - Obtained per protocol and following the instructions in the BWEL Weight and Height Protocol document. Waist and hip measured in centimeters.   Plan- Patient understands that she will continue in the follow-up phase including ongoing measurements and H&Ps to occur annually and agrees to this plan. Thanked patient for her study participation. Encouraged her to call research nurse if any questions/concerns prior to next visit. She verbalized understanding.  Domenica Reamer, BSN, RN, [employer] Research Nurse II 03/15/2022

## 2022-03-16 ENCOUNTER — Ambulatory Visit: Payer: Medicare Other | Admitting: Hematology and Oncology

## 2022-03-16 ENCOUNTER — Other Ambulatory Visit: Payer: Medicare Other

## 2022-03-19 ENCOUNTER — Other Ambulatory Visit (HOSPITAL_COMMUNITY): Payer: Self-pay

## 2022-03-20 ENCOUNTER — Ambulatory Visit: Payer: Medicare Other | Admitting: Podiatry

## 2022-03-20 DIAGNOSIS — Z01818 Encounter for other preprocedural examination: Secondary | ICD-10-CM | POA: Diagnosis not present

## 2022-03-20 DIAGNOSIS — H25811 Combined forms of age-related cataract, right eye: Secondary | ICD-10-CM | POA: Diagnosis not present

## 2022-03-20 DIAGNOSIS — H25812 Combined forms of age-related cataract, left eye: Secondary | ICD-10-CM | POA: Diagnosis not present

## 2022-03-22 ENCOUNTER — Other Ambulatory Visit (HOSPITAL_COMMUNITY): Payer: Self-pay

## 2022-03-22 ENCOUNTER — Other Ambulatory Visit: Payer: Self-pay | Admitting: Internal Medicine

## 2022-03-22 ENCOUNTER — Ambulatory Visit (INDEPENDENT_AMBULATORY_CARE_PROVIDER_SITE_OTHER): Payer: Medicare Other | Admitting: Podiatry

## 2022-03-22 DIAGNOSIS — M76822 Posterior tibial tendinitis, left leg: Secondary | ICD-10-CM | POA: Diagnosis not present

## 2022-03-22 DIAGNOSIS — M778 Other enthesopathies, not elsewhere classified: Secondary | ICD-10-CM

## 2022-03-22 MED ORDER — DEXAMETHASONE SODIUM PHOSPHATE 120 MG/30ML IJ SOLN
2.0000 mg | Freq: Once | INTRAMUSCULAR | Status: AC
  Administered 2022-03-22: 2 mg via INTRA_ARTICULAR

## 2022-03-22 NOTE — Progress Notes (Signed)
She presents today states that her posterior tibial tendon dysfunction of her left foot is doing much better she like to have another shot if at all possible.  She continues to wear her cam boot at all times and take her meloxicam.  Objective: Vital signs stable [place] x3 pes planovalgus with pain on palpation of the posterior tibial tendon just beneath the medial malleolus.  Assessment: Posterior tibial tendon dysfunction tendinitis.  Plan: Injected subcutaneous tissue today with dexamethasone local anesthetic she will continue use of the cam walker and meloxicam as well as ice therapy follow-up with her in 1 month which time may need to consider MRI.

## 2022-03-23 ENCOUNTER — Other Ambulatory Visit (HOSPITAL_COMMUNITY): Payer: Self-pay

## 2022-03-27 ENCOUNTER — Other Ambulatory Visit: Payer: Self-pay | Admitting: Internal Medicine

## 2022-03-27 ENCOUNTER — Other Ambulatory Visit (HOSPITAL_COMMUNITY): Payer: Self-pay

## 2022-04-02 ENCOUNTER — Other Ambulatory Visit (HOSPITAL_COMMUNITY): Payer: Self-pay

## 2022-04-02 ENCOUNTER — Other Ambulatory Visit: Payer: Self-pay | Admitting: Internal Medicine

## 2022-04-02 ENCOUNTER — Other Ambulatory Visit: Payer: Self-pay

## 2022-04-02 MED ORDER — GLIMEPIRIDE 4 MG PO TABS
4.0000 mg | ORAL_TABLET | Freq: Every day | ORAL | 3 refills | Status: DC
  Filled 2022-04-02: qty 90, 90d supply, fill #0
  Filled 2022-06-25: qty 90, 90d supply, fill #1
  Filled 2022-09-18: qty 90, 90d supply, fill #2
  Filled 2022-12-21: qty 90, 90d supply, fill #3

## 2022-04-02 NOTE — Telephone Encounter (Signed)
Last HgA1c not at goal was 12/2020. Needs visit prior to refill. Once visit made can have 30 day no refills

## 2022-04-03 ENCOUNTER — Other Ambulatory Visit (HOSPITAL_COMMUNITY): Payer: Self-pay

## 2022-04-06 ENCOUNTER — Other Ambulatory Visit: Payer: Self-pay | Admitting: Hematology and Oncology

## 2022-04-06 ENCOUNTER — Other Ambulatory Visit (HOSPITAL_COMMUNITY): Payer: Self-pay

## 2022-04-06 DIAGNOSIS — G629 Polyneuropathy, unspecified: Secondary | ICD-10-CM

## 2022-04-06 DIAGNOSIS — C50011 Malignant neoplasm of nipple and areola, right female breast: Secondary | ICD-10-CM

## 2022-04-06 MED ORDER — GABAPENTIN 300 MG PO CAPS
600.0000 mg | ORAL_CAPSULE | Freq: Three times a day (TID) | ORAL | 2 refills | Status: DC
  Filled 2022-04-06: qty 180, 30d supply, fill #0
  Filled 2022-05-07: qty 180, 30d supply, fill #1
  Filled 2022-06-04: qty 180, 30d supply, fill #2

## 2022-04-07 ENCOUNTER — Other Ambulatory Visit (HOSPITAL_COMMUNITY): Payer: Self-pay

## 2022-04-10 ENCOUNTER — Other Ambulatory Visit (HOSPITAL_COMMUNITY): Payer: Self-pay

## 2022-04-10 NOTE — Telephone Encounter (Signed)
The original prescription was reordered on 04/02/2022 by [name], [place]. Renewing .Margaret [name]

## 2022-04-11 ENCOUNTER — Other Ambulatory Visit (HOSPITAL_COMMUNITY): Payer: Self-pay

## 2022-04-12 ENCOUNTER — Other Ambulatory Visit (HOSPITAL_COMMUNITY): Payer: Self-pay

## 2022-04-12 MED ORDER — PREDNISOLONE ACETATE 1 % OP SUSP
OPHTHALMIC | 0 refills | Status: AC
  Filled 2022-04-12: qty 10, 21d supply, fill #0

## 2022-04-17 ENCOUNTER — Other Ambulatory Visit (HOSPITAL_COMMUNITY): Payer: Self-pay

## 2022-04-18 ENCOUNTER — Other Ambulatory Visit (HOSPITAL_COMMUNITY): Payer: Self-pay

## 2022-04-18 DIAGNOSIS — H269 Unspecified cataract: Secondary | ICD-10-CM | POA: Diagnosis not present

## 2022-04-18 DIAGNOSIS — H25812 Combined forms of age-related cataract, left eye: Secondary | ICD-10-CM | POA: Diagnosis not present

## 2022-04-25 ENCOUNTER — Other Ambulatory Visit (HOSPITAL_COMMUNITY): Payer: Self-pay

## 2022-04-30 ENCOUNTER — Telehealth: Payer: Self-pay | Admitting: Internal Medicine

## 2022-04-30 ENCOUNTER — Telehealth: Payer: Self-pay | Admitting: Podiatry

## 2022-04-30 NOTE — Telephone Encounter (Signed)
LVM for pt to rtn my call to schedule AWV with [facility] call back # [phone] 

## 2022-04-30 NOTE — Telephone Encounter (Signed)
Pt left message today at 1251pm that she needed to reschedule the appt on 9.28 as she is out of town and will not be back until 10.6.Marland Kitchen  I returned call and left message for pt that I have cxled the appt and for her to please call the office to reschedule.

## 2022-05-03 ENCOUNTER — Ambulatory Visit: Payer: Medicare Other | Admitting: Podiatry

## 2022-05-07 ENCOUNTER — Other Ambulatory Visit (HOSPITAL_COMMUNITY): Payer: Self-pay

## 2022-05-14 ENCOUNTER — Other Ambulatory Visit (HOSPITAL_COMMUNITY): Payer: Self-pay

## 2022-05-14 ENCOUNTER — Other Ambulatory Visit: Payer: Self-pay | Admitting: Oncology

## 2022-05-14 DIAGNOSIS — C50412 Malignant neoplasm of upper-outer quadrant of left female breast: Secondary | ICD-10-CM

## 2022-05-14 DIAGNOSIS — C50211 Malignant neoplasm of upper-inner quadrant of right female breast: Secondary | ICD-10-CM

## 2022-05-14 MED ORDER — VENLAFAXINE HCL ER 150 MG PO CP24
150.0000 mg | ORAL_CAPSULE | Freq: Every day | ORAL | 0 refills | Status: DC
  Filled 2022-05-14: qty 90, 90d supply, fill #0

## 2022-05-29 DIAGNOSIS — C50912 Malignant neoplasm of unspecified site of left female breast: Secondary | ICD-10-CM | POA: Diagnosis not present

## 2022-05-29 DIAGNOSIS — C50911 Malignant neoplasm of unspecified site of right female breast: Secondary | ICD-10-CM | POA: Diagnosis not present

## 2022-06-04 ENCOUNTER — Other Ambulatory Visit: Payer: Self-pay | Admitting: Internal Medicine

## 2022-06-04 ENCOUNTER — Other Ambulatory Visit (HOSPITAL_COMMUNITY): Payer: Self-pay

## 2022-06-07 ENCOUNTER — Other Ambulatory Visit (HOSPITAL_COMMUNITY): Payer: Self-pay

## 2022-06-07 MED ORDER — BACLOFEN 20 MG PO TABS
20.0000 mg | ORAL_TABLET | Freq: Three times a day (TID) | ORAL | 5 refills | Status: DC
  Filled 2022-06-07: qty 90, 30d supply, fill #0
  Filled 2022-07-11: qty 90, 30d supply, fill #1
  Filled 2022-09-03: qty 90, 30d supply, fill #2
  Filled 2022-09-28: qty 90, 30d supply, fill #3
  Filled 2022-11-16: qty 90, 30d supply, fill #4
  Filled 2022-12-10: qty 90, 30d supply, fill #5

## 2022-06-11 ENCOUNTER — Telehealth: Payer: Self-pay | Admitting: *Deleted

## 2022-06-11 ENCOUNTER — Other Ambulatory Visit (HOSPITAL_COMMUNITY): Payer: Self-pay

## 2022-06-11 MED ORDER — MELOXICAM 15 MG PO TABS
15.0000 mg | ORAL_TABLET | Freq: Every day | ORAL | 3 refills | Status: DC
  Filled 2022-06-11: qty 30, 30d supply, fill #0
  Filled 2022-07-11: qty 30, 30d supply, fill #1
  Filled 2022-08-21: qty 30, 30d supply, fill #2
  Filled 2022-09-18: qty 30, 30d supply, fill #3

## 2022-06-11 NOTE — Telephone Encounter (Signed)
Patient is calling to ask for a refill of the meloxicam, has an upcoming appointment 07/12/22. Medication sent

## 2022-06-12 DIAGNOSIS — M4722 Other spondylosis with radiculopathy, cervical region: Secondary | ICD-10-CM | POA: Diagnosis not present

## 2022-06-18 ENCOUNTER — Other Ambulatory Visit (HOSPITAL_COMMUNITY): Payer: Self-pay

## 2022-06-19 ENCOUNTER — Encounter: Payer: Self-pay | Admitting: Internal Medicine

## 2022-06-19 ENCOUNTER — Other Ambulatory Visit (HOSPITAL_COMMUNITY): Payer: Self-pay

## 2022-06-19 ENCOUNTER — Telehealth (INDEPENDENT_AMBULATORY_CARE_PROVIDER_SITE_OTHER): Payer: Medicare Other | Admitting: Internal Medicine

## 2022-06-19 DIAGNOSIS — I10 Essential (primary) hypertension: Secondary | ICD-10-CM

## 2022-06-19 DIAGNOSIS — E785 Hyperlipidemia, unspecified: Secondary | ICD-10-CM

## 2022-06-19 DIAGNOSIS — C50412 Malignant neoplasm of upper-outer quadrant of left female breast: Secondary | ICD-10-CM | POA: Diagnosis not present

## 2022-06-19 DIAGNOSIS — F4323 Adjustment disorder with mixed anxiety and depressed mood: Secondary | ICD-10-CM | POA: Diagnosis not present

## 2022-06-19 DIAGNOSIS — M5416 Radiculopathy, lumbar region: Secondary | ICD-10-CM | POA: Diagnosis not present

## 2022-06-19 DIAGNOSIS — E1169 Type 2 diabetes mellitus with other specified complication: Secondary | ICD-10-CM

## 2022-06-19 DIAGNOSIS — Z170 Estrogen receptor positive status [ER+]: Secondary | ICD-10-CM | POA: Diagnosis not present

## 2022-06-19 MED ORDER — BUSPIRONE HCL 5 MG PO TABS
5.0000 mg | ORAL_TABLET | Freq: Every day | ORAL | 2 refills | Status: AC | PRN
  Filled 2022-06-19: qty 30, 30d supply, fill #0
  Filled 2022-08-17: qty 30, 30d supply, fill #1
  Filled 2022-09-18: qty 30, 30d supply, fill #2

## 2022-06-19 MED ORDER — PANTOPRAZOLE SODIUM 40 MG PO TBEC
DELAYED_RELEASE_TABLET | Freq: Every day | ORAL | 3 refills | Status: DC
  Filled 2022-06-19: qty 90, 90d supply, fill #0
  Filled 2022-09-03: qty 90, 90d supply, fill #1
  Filled 2022-12-10: qty 90, 90d supply, fill #2
  Filled 2023-02-15 – 2023-03-04 (×2): qty 90, 90d supply, fill #3

## 2022-06-19 NOTE — Assessment & Plan Note (Signed)
Ordered lipid panel and adjust lipitor 20 mg daily as needed for LDL goal <100.

## 2022-06-19 NOTE — Assessment & Plan Note (Signed)
No recent labs so ordered microalbumin to creatinine ratio and HgA1c and lipid panel. Adjust as needed. She is taking ARB and statin. Taking amaryl 4 mg daily without low sugars.

## 2022-06-19 NOTE — Assessment & Plan Note (Signed)
Overall worsening and seeing neurosurgery and they are prescribing her oxycodone/apap recently so she is no longer getting tramadol from [place].

## 2022-06-19 NOTE — Progress Notes (Signed)
Virtual Visit via Video Note  I connected with Margaret [name] on 06/19/22 at 10:20 AM EST by a video enabled telemedicine application and verified that I am speaking with the correct person using two identifiers.  The patient and the provider were at separate locations throughout the entire encounter. Patient location: home, Provider location: work   I discussed the limitations of evaluation and management by telemedicine and the availability of in person appointments. The patient expressed understanding and agreed to proceed. The patient and the provider were the only parties present for the visit unless noted in HPI below.  History of Present Illness: The patient is a 61 y.o. female with visit for worsening neuropathy. Seeing podiatry and neurosurgery.   Observations/Objective: Appearance: normal, breathing appears normal, casual grooming, mental status is A and O times 3  Assessment and Plan: See problem oriented charting  Follow Up Instructions: ordered labs to get done  I discussed the assessment and treatment plan with the patient. The patient was provided an opportunity to ask questions and all were answered. The patient agreed with the plan and demonstrated an understanding of the instructions.   The patient was advised to call back or seek an in-person evaluation if the symptoms worsen or if the condition fails to improve as anticipated.  Hoyt Koch, MD

## 2022-06-19 NOTE — Assessment & Plan Note (Signed)
Per recent visit BP at goal. She is taking olmesartan/amlodipine/hctz 20/5/12.5 mg daily. Recent CMP without indication for change.

## 2022-06-19 NOTE — Assessment & Plan Note (Signed)
Worse due to new health condition of mom. Refill buspar 5 mg prn which she has used before with good success. Taking effexor 150 mg daily.

## 2022-06-20 ENCOUNTER — Other Ambulatory Visit (HOSPITAL_COMMUNITY): Payer: Self-pay

## 2022-06-20 MED ORDER — OXYCODONE-ACETAMINOPHEN 5-325 MG PO TABS
1.0000 | ORAL_TABLET | Freq: Three times a day (TID) | ORAL | 0 refills | Status: DC | PRN
  Filled 2022-06-20: qty 30, 10d supply, fill #0

## 2022-06-21 ENCOUNTER — Other Ambulatory Visit (HOSPITAL_COMMUNITY): Payer: Self-pay

## 2022-06-25 ENCOUNTER — Other Ambulatory Visit (HOSPITAL_COMMUNITY): Payer: Self-pay

## 2022-06-26 ENCOUNTER — Other Ambulatory Visit (HOSPITAL_COMMUNITY): Payer: Self-pay

## 2022-07-03 DIAGNOSIS — M5416 Radiculopathy, lumbar region: Secondary | ICD-10-CM | POA: Diagnosis not present

## 2022-07-11 ENCOUNTER — Other Ambulatory Visit (HOSPITAL_COMMUNITY): Payer: Self-pay

## 2022-07-12 ENCOUNTER — Encounter: Payer: Self-pay | Admitting: Podiatry

## 2022-07-12 ENCOUNTER — Other Ambulatory Visit (HOSPITAL_COMMUNITY): Payer: Self-pay

## 2022-07-12 ENCOUNTER — Ambulatory Visit (INDEPENDENT_AMBULATORY_CARE_PROVIDER_SITE_OTHER): Payer: Medicare Other | Admitting: Podiatry

## 2022-07-12 DIAGNOSIS — M76822 Posterior tibial tendinitis, left leg: Secondary | ICD-10-CM | POA: Diagnosis not present

## 2022-07-12 DIAGNOSIS — S86112A Strain of other muscle(s) and tendon(s) of posterior muscle group at lower leg level, left leg, initial encounter: Secondary | ICD-10-CM

## 2022-07-12 MED ORDER — TRIAMCINOLONE ACETONIDE 40 MG/ML IJ SUSP
20.0000 mg | Freq: Once | INTRAMUSCULAR | Status: AC
  Administered 2022-07-12: 20 mg

## 2022-07-13 ENCOUNTER — Other Ambulatory Visit (HOSPITAL_COMMUNITY): Payer: Self-pay

## 2022-07-15 NOTE — Progress Notes (Signed)
She presents today for follow-up of her posterior tibial tendon dysfunction and tendinitis.  States that she currently she is unable to be out of work for surgery but she would like to consider the MRI if possible she would like for [place] to schedule it after the holidays.  She is requesting an injection to the tendon of the left foot.  Objective: Vital signs stable tellurian x 3.  Pulses are palpable.  Past plan of Algus bilaterally.  Mild edema to the midfoot and medial foot left.  She has pain on palpation of the posterior tibial tendon as it courses beneath the medial malleolus extending to the navicular tuberosity.  She also has tenderness on inversion against resistance in this area.  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  Plan: Discussed etiology pathology and surgical therapies we scheduled her MRI after the holidays of that rear foot left.  Expecting to find a posterior tibial tendon tear.  I injected it inferior to the area today with 10 mg of Kenalog and local anesthetic.  Recommended that she keep this immobilized for the next few days and continue to use ice when necessary.  I will follow-up with her in the next few weeks.

## 2022-07-23 ENCOUNTER — Other Ambulatory Visit: Payer: Self-pay | Admitting: Hematology and Oncology

## 2022-07-23 DIAGNOSIS — G629 Polyneuropathy, unspecified: Secondary | ICD-10-CM

## 2022-07-23 DIAGNOSIS — Z170 Estrogen receptor positive status [ER+]: Secondary | ICD-10-CM

## 2022-07-24 ENCOUNTER — Other Ambulatory Visit: Payer: Self-pay

## 2022-07-24 ENCOUNTER — Other Ambulatory Visit (HOSPITAL_COMMUNITY): Payer: Self-pay

## 2022-07-24 MED ORDER — GABAPENTIN 300 MG PO CAPS
600.0000 mg | ORAL_CAPSULE | Freq: Three times a day (TID) | ORAL | 2 refills | Status: DC
  Filled 2022-07-24: qty 180, 30d supply, fill #0
  Filled 2022-09-03: qty 180, 30d supply, fill #1
  Filled 2022-10-05: qty 180, 30d supply, fill #2

## 2022-08-09 ENCOUNTER — Other Ambulatory Visit: Payer: Medicare Other

## 2022-08-17 ENCOUNTER — Other Ambulatory Visit: Payer: Self-pay | Admitting: Hematology and Oncology

## 2022-08-17 ENCOUNTER — Other Ambulatory Visit: Payer: Self-pay | Admitting: Internal Medicine

## 2022-08-17 ENCOUNTER — Other Ambulatory Visit (HOSPITAL_COMMUNITY): Payer: Self-pay

## 2022-08-17 ENCOUNTER — Other Ambulatory Visit: Payer: Self-pay

## 2022-08-17 DIAGNOSIS — G4720 Circadian rhythm sleep disorder, unspecified type: Secondary | ICD-10-CM

## 2022-08-17 MED ORDER — IBUPROFEN 800 MG PO TABS
800.0000 mg | ORAL_TABLET | Freq: Three times a day (TID) | ORAL | 5 refills | Status: DC | PRN
  Filled 2022-08-17: qty 90, 30d supply, fill #0
  Filled 2022-09-27 – 2022-09-28 (×3): qty 90, 30d supply, fill #1
  Filled 2022-10-24: qty 90, 30d supply, fill #2
  Filled 2022-12-10: qty 90, 30d supply, fill #3
  Filled 2023-01-15: qty 90, 30d supply, fill #4
  Filled 2023-03-04: qty 90, 30d supply, fill #5

## 2022-08-17 NOTE — Telephone Encounter (Signed)
She has been on the effexor for years

## 2022-08-20 ENCOUNTER — Other Ambulatory Visit (HOSPITAL_COMMUNITY): Payer: Self-pay

## 2022-08-20 ENCOUNTER — Other Ambulatory Visit: Payer: Self-pay

## 2022-08-20 MED ORDER — TRAZODONE HCL 50 MG PO TABS
ORAL_TABLET | ORAL | 2 refills | Status: DC
  Filled 2022-08-20: qty 60, 30d supply, fill #0
  Filled 2022-09-18: qty 60, 30d supply, fill #1
  Filled 2022-10-24: qty 60, 30d supply, fill #2

## 2022-08-21 ENCOUNTER — Other Ambulatory Visit (HOSPITAL_COMMUNITY): Payer: Self-pay

## 2022-08-22 ENCOUNTER — Other Ambulatory Visit (HOSPITAL_COMMUNITY): Payer: Self-pay

## 2022-08-23 ENCOUNTER — Ambulatory Visit
Admission: RE | Admit: 2022-08-23 | Discharge: 2022-08-23 | Disposition: A | Discharge status: Home / Self Care | Payer: 59 | Source: Ambulatory Visit | Attending: Podiatry | Admitting: Podiatry

## 2022-08-23 DIAGNOSIS — R600 Localized edema: Secondary | ICD-10-CM | POA: Diagnosis not present

## 2022-08-23 DIAGNOSIS — M19072 Primary osteoarthritis, left ankle and foot: Secondary | ICD-10-CM | POA: Diagnosis not present

## 2022-08-23 DIAGNOSIS — M76822 Posterior tibial tendinitis, left leg: Secondary | ICD-10-CM

## 2022-08-24 ENCOUNTER — Other Ambulatory Visit (HOSPITAL_COMMUNITY): Payer: Self-pay

## 2022-08-24 ENCOUNTER — Other Ambulatory Visit: Payer: Self-pay | Admitting: Hematology and Oncology

## 2022-08-24 DIAGNOSIS — C50412 Malignant neoplasm of upper-outer quadrant of left female breast: Secondary | ICD-10-CM

## 2022-08-24 DIAGNOSIS — C50211 Malignant neoplasm of upper-inner quadrant of right female breast: Secondary | ICD-10-CM

## 2022-08-24 MED ORDER — VENLAFAXINE HCL ER 150 MG PO CP24
150.0000 mg | ORAL_CAPSULE | Freq: Every day | ORAL | 0 refills | Status: DC
  Filled 2022-08-24: qty 90, 90d supply, fill #0

## 2022-08-27 ENCOUNTER — Other Ambulatory Visit (HOSPITAL_COMMUNITY): Payer: Self-pay

## 2022-08-27 ENCOUNTER — Other Ambulatory Visit: Payer: Self-pay | Admitting: Hematology and Oncology

## 2022-08-27 DIAGNOSIS — C50412 Malignant neoplasm of upper-outer quadrant of left female breast: Secondary | ICD-10-CM

## 2022-08-27 DIAGNOSIS — C50211 Malignant neoplasm of upper-inner quadrant of right female breast: Secondary | ICD-10-CM

## 2022-08-29 ENCOUNTER — Telehealth: Payer: Self-pay | Admitting: *Deleted

## 2022-08-29 NOTE — Telephone Encounter (Signed)
-----  Message from Garrel Ridgel, [place] sent at 08/28/2022  4:56 PM EST ----- Please let her know that she has bad tendinosis and will need PT (please send) also has osteoarthritis and may need surgery if PT fails

## 2022-08-30 ENCOUNTER — Telehealth: Payer: Self-pay | Admitting: *Deleted

## 2022-08-30 DIAGNOSIS — S86112A Strain of other muscle(s) and tendon(s) of posterior muscle group at lower leg level, left leg, initial encounter: Secondary | ICD-10-CM

## 2022-08-31 NOTE — Telephone Encounter (Signed)
error 

## 2022-09-03 ENCOUNTER — Other Ambulatory Visit: Payer: Self-pay

## 2022-09-03 ENCOUNTER — Other Ambulatory Visit (HOSPITAL_COMMUNITY): Payer: Self-pay

## 2022-09-18 ENCOUNTER — Other Ambulatory Visit: Payer: Self-pay

## 2022-09-18 ENCOUNTER — Other Ambulatory Visit (HOSPITAL_COMMUNITY): Payer: Self-pay

## 2022-09-18 ENCOUNTER — Other Ambulatory Visit: Payer: Self-pay | Admitting: Internal Medicine

## 2022-09-18 MED ORDER — ATORVASTATIN CALCIUM 20 MG PO TABS
20.0000 mg | ORAL_TABLET | Freq: Every day | ORAL | 0 refills | Status: DC
  Filled 2022-09-18: qty 90, 90d supply, fill #0

## 2022-09-19 ENCOUNTER — Ambulatory Visit (INDEPENDENT_AMBULATORY_CARE_PROVIDER_SITE_OTHER): Payer: 59

## 2022-09-19 VITALS — Wt 251.0 lb

## 2022-09-19 DIAGNOSIS — Z0000 Encounter for general adult medical examination without abnormal findings: Secondary | ICD-10-CM | POA: Diagnosis not present

## 2022-09-19 NOTE — Progress Notes (Signed)
I connected with  Margaret [name] on 09/19/22 by a audio enabled telemedicine application and verified that I am speaking with the correct person using two identifiers.  Patient Location: Home  Provider Location: Home Office  I discussed the limitations of evaluation and management by telemedicine. The patient expressed understanding and agreed to proceed.   Subjective:   Margaret [name] is a 62 y.o. female who presents for an Initial Medicare Annual Wellness Visit.  Review of Systems     Cardiac Risk Factors include: advanced age (>27mn, >>67women);dyslipidemia;hypertension;diabetes mellitus;obesity (BMI >30kg/m2)     Objective:    Today's Vitals   09/19/22 1554  Weight: 251 lb (113.9 kg)   Body mass index is 37.61 kg/m.     09/19/2022    3:57 PM 05/22/2018   10:25 AM 12/20/2016   11:31 AM 05/30/2016    6:14 PM 04/26/2016    3:45 PM 03/22/2016    3:31 PM 02/08/2016    1:41 PM  Advanced Directives  Does Patient Have a Medical Advance Directive? Yes Yes Yes No No No No  Type of AParamedicof [place]Living will [place] will [place] will      Copy of HWy[place]in Chart? No - copy requested No - copy requested       Would patient like information on creating a medical advance directive?  No - Patient declined         Current Medications (verified) Outpatient Encounter Medications as of 09/19/2022  Medication Sig   AREXVY 120 MCG/0.5ML injection    atorvastatin (LIPITOR) 20 MG tablet Take 1 tablet (20 mg total) by mouth daily. Follow-up w/ labs is due in  must see provider for future refills   baclofen (LIORESAL) 20 MG tablet Take 1 tablet (20 mg total) by mouth 3 (three) times daily.   Blood Glucose Monitoring Suppl (BLOOD GLUCOSE MONITOR SYSTEM) w/Device KIT Use to test blood sugar 3 times a day   busPIRone (BUSPAR) 5 MG tablet Take 1 tablet (5 mg total) by mouth daily as  needed.   calcium-vitamin D (OSCAL WITH D) 500-200 MG-UNIT tablet Take 1 tablet by mouth 2 (two) times daily.   diphenhydrAMINE (BENADRYL) 25 mg capsule Take 1 capsule (25 mg total) by mouth every 6 (six) hours as needed. (Patient taking differently: Take 25 mg by mouth every 6 (six) hours as needed for allergies.)   FLUBLOK QUADRIVALENT 0.5 ML injection    gabapentin (NEURONTIN) 300 MG capsule Take 2 capsules (600 mg total) by mouth 3 (three) times daily.   glimepiride (AMARYL) 4 MG tablet Take 1 tablet (4 mg total) by mouth daily before breakfast.   glucose blood test strip Use to test blood sugar 3 times a day   ibuprofen (ADVIL) 800 MG tablet Take 1 tablet (800 mg total) by mouth every 8 (eight) hours as needed.   Lancets (ONETOUCH DELICA PLUS [id number] MISC Use to test blood sugar 3 times a day   meloxicam (MOBIC) 15 MG tablet Take 1 tablet (15 mg total) by mouth daily.   Multiple Vitamins-Minerals (CENTRUM SILVER 50+WOMEN PO) Take 1 tablet by mouth daily.   Olmesartan-amLODIPine-HCTZ 20-5-12.5 MG TABS TAKE 1 TABLET BY MOUTH ONCE A DAY. REPLACES AMLODIPINE AND LOSARTAN/HCTZ   pantoprazole (PROTONIX) 40 MG tablet TAKE 1 TABLET BY MOUTH ONCE DAILY   potassium chloride SA (KLOR-CON M) 20 MEQ tablet Take 1 tablet by mouth 2 times daily.  SPIKEVAX syringe    traZODone (DESYREL) 50 MG tablet TAKE 1 TABLET BY MOUTH AT BEDTIME. MAY INCREASE TO 2 TABLETS AFTER FEW DAYS IF NEEDED   valACYclovir (VALTREX) 1000 MG tablet TAKE 1 TABLET BY MOUTH TWICE DAILY   venlafaxine XR (EFFEXOR-XR) 150 MG 24 hr capsule Take 1 capsule (150 mg total) by mouth daily with breakfast.   [DISCONTINUED] losartan-hydrochlorothiazide (HYZAAR) 100-25 MG tablet TAKE 1 TABLET BY MOUTH ONCE DAILY   [DISCONTINUED] oxyCODONE-acetaminophen (PERCOCET/ROXICET) 5-325 MG tablet Take 1 tablet by mouth every 8 - 12 hours as needed for severe pain only (DNF 02/07/22)   [DISCONTINUED] oxyCODONE-acetaminophen (PERCOCET/ROXICET) 5-325 MG  tablet Take 1 tablet by mouth every 8 (eight) to 12 (twelve) hours as needed for severe pain only.   No facility-administered encounter medications on file as of 09/19/2022.    Allergies (verified) Banana, Latex, Peanuts [peanut oil], and Wheat bran   History: Past Medical History:  Diagnosis Date   Allergy    Anxiety    Arthritis    Bilateral breast cancer ([facility])    Breast cancer ([facility])    Breast cancer of upper-outer quadrant of left female breast ([facility]) 04/21/2015   Depression    Diabetes mellitus without complication ([facility])    GERD (gastroesophageal reflux disease)    Hypertension    Past Surgical History:  Procedure Laterality Date   ABDOMINAL HYSTERECTOMY  1990   MASTECTOMY MODIFIED RADICAL Left 05/09/2015   MASTECTOMY MODIFIED RADICAL Left 05/09/2015   Procedure: LEFT MODIFIED RADICAL MASTETCTOMY;  Surgeon: Autumn Messing III, MD;  Location: [facility];  Service: General;  Laterality: Left;   MASTECTOMY W/ SENTINEL NODE BIOPSY Right    MASTECTOMY W/ SENTINEL NODE BIOPSY Right 05/09/2015   Procedure: RIGHT MASTECTOMY WITH RIGHT AXILLARY SENTINEL LYMPH NODE BIOPSY;  Surgeon: Autumn Messing III, MD;  Location: [facility];  Service: General;  Laterality: Right;   PORTACATH PLACEMENT Right 09/01/2015   Procedure: INSERTION PORT-A-CATH;  Surgeon: Autumn Messing III, MD;  Location: [facility];  Service: General;  Laterality: Right;   TUBAL LIGATION  1984   Family History  Problem Relation Age of Onset   Hypertension Mother    Diabetes Mother    Colon cancer Neg Hx    Esophageal cancer Neg Hx    Rectal cancer Neg Hx    Stomach cancer Neg Hx    Social History   Socioeconomic History   Marital status: Single    Spouse name: Not on file   Number of children: 4   Years of education: Not on file   Highest education level: Not on file  Occupational History   Not on file  Tobacco Use   Smoking status: Former    Packs/day: 0.10    Years: 28.00    Total pack years: 2.80    Types:  Cigarettes    Quit date: 09/02/2018    Years since quitting: 4.0   Smokeless tobacco: Never  Vaping Use   Vaping Use: Never used  Substance and Sexual Activity   Alcohol use: Not Currently    Comment: Socially   Drug use: No   Sexual activity: Yes    Birth control/protection: Surgical  Other Topics Concern   Not on file  Social History Narrative   Not on file   Social Determinants of Health   Financial Resource Strain: Low Risk  (09/19/2022)   Overall Financial Resource Strain (CARDIA)    Difficulty of Paying Living Expenses: Not hard at all  Food  Insecurity: No Food Insecurity (09/19/2022)   Hunger Vital Sign    Worried About Running Out of Food in the Last Year: Never true    Ran Out of Food in the Last Year: Never true  Transportation Needs: No Transportation Needs (09/19/2022)   PRAPARE - Hydrologist (Medical): No    Lack of Transportation (Non-Medical): No  Physical Activity: Inactive (09/19/2022)   Exercise Vital Sign    Days of Exercise per Week: 0 days    Minutes of Exercise per Session: 0 min  Stress: No Stress Concern Present (09/19/2022)   [place]    Feeling of Stress : Not at all  Social Connections: Moderately Isolated (09/19/2022)   Social Connection and Isolation Panel [NHANES]    Frequency of Communication with Friends and Family: More than three times a week    Frequency of Social Gatherings with Friends and Family: More than three times a week    Attends Religious Services: 1 to 4 times per year    Active Member of [employer] or Organizations: No    Attends Music therapist: Never    Marital Status: Never married    Tobacco Counseling Counseling given: Not Answered   Clinical Intake:  Pre-visit preparation completed: Yes  Pain : No/denies pain     BMI - recorded: 37.61 Nutritional Status: BMI > 30  Obese Nutritional Risks: None Diabetes:  Yes CBG done?: Yes (90 per pt) CBG resulted in Enter/ Edit results?: No Did pt. bring in CBG monitor from home?: No  How often do you need to have someone help you when you read instructions, pamphlets, or other written materials from your doctor or pharmacy?: 1 - Never  Diabetic?Nutrition Risk Assessment:  Has the patient had any N/V/D within the last 2 months?  No  Does the patient have any non-healing wounds?  No  Has the patient had any unintentional weight loss or weight gain?  No   Diabetes:  Is the patient diabetic?  Yes  If diabetic, was a CBG obtained today?  Yes  Did the patient bring in their glucometer from home?  No  How often do you monitor your CBG's? daily.   Financial Strains and Diabetes Management:  Are you having any financial strains with the device, your supplies or your medication? No .  Does the patient want to be seen by Chronic Care Management for management of their diabetes?  No  Would the patient like to be referred to a Nutritionist or for Diabetic Management?  No   Diabetic Exams:  Diabetic Eye Exam: Overdue for diabetic eye exam. Pt has been advised about the importance in completing this exam. Patient advised to call and schedule an eye exam. Diabetic Foot Exam: Overdue, Pt has been advised about the importance in completing this exam. Pt is scheduled for diabetic foot exam on nextappt .   Interpreter Needed?: No  Information entered by :: Charlott Rakes, LPN   Activities of Daily Living    09/19/2022    4:00 PM  In your present state of health, do you have any difficulty performing the following activities:  Hearing? 0  Vision? 0  Difficulty concentrating or making decisions? 0  Walking or climbing stairs? 1  Comment at times related to feet nueropathy  Dressing or bathing? 0  Doing errands, shopping? 0  Preparing Food and eating ? N  Using the Toilet? N  In the  past six months, have you accidently leaked urine? Y  Comment at times  wears a brief  Do you have problems with loss of bowel control? N  Managing your Medications? N  Managing your Finances? N  Housekeeping or managing your Housekeeping? N    Patient Care Team: Hoyt Koch, MD as PCP - General (Internal Medicine) Jovita Kussmaul, MD as Consulting Physician (General Surgery) Gery Pray, MD as Consulting Physician (Radiation Oncology) Mauro Kaufmann, RN as Registered Nurse Rockwell Germany, RN as Registered Nurse Rosemarie Ax, MD as Consulting Physician (Family Medicine) Charlton Haws, [facility] as Pharmacist (Pharmacist) Benay Pike, MD as Consulting Physician (Hematology and Oncology)  Indicate any recent Medical Services you may have received from other than [facility] providers in the past year (date may be approximate).     Assessment:   This is a routine wellness examination for Margaret [name].  Hearing/Vision screen Hearing Screening - Comments:: Pt denies any hearing  issues  Vision Screening - Comments:: Pt follows up with [facility]  Dietary issues and exercise activities discussed: Current Exercise Habits: The patient has a physically strenuous job, but has no regular exercise apart from work.   Goals Addressed             This Visit's Progress    Patient Stated       Get pain out my back        Depression Screen    09/19/2022    3:58 PM 12/13/2020    9:57 AM 08/16/2020   11:17 AM 03/28/2017   11:01 AM  PHQ 2/9 Scores  PHQ - 2 Score 1 0 0 2  PHQ- 9 Score   4     Fall Risk    09/19/2022    4:00 PM 12/13/2020    9:57 AM 03/28/2017   11:01 AM  Fall Risk   Falls in the past year? 0 0 No  Number falls in past yr: 0 0   Injury with Fall? 0 0   Risk for fall due to : Impaired balance/gait No Fall Risks   Follow up Falls prevention discussed Falls evaluation completed     FALL RISK PREVENTION PERTAINING TO THE HOME:  Any stairs in or around the home? Yes  If so, are there any without handrails? No   Home free of loose throw rugs in walkways, pet beds, electrical cords, etc? Yes  Adequate lighting in your home to reduce risk of falls? Yes   ASSISTIVE DEVICES UTILIZED TO PREVENT FALLS:  Life alert? No  Use of a cane, walker or w/c? No  Grab bars in the bathroom? No  Shower chair or bench in shower? No  Elevated toilet seat or a handicapped toilet? No   TIMED UP AND GO:  Was the test performed? No .   Cognitive Function:        09/19/2022    4:02 PM  6CIT Screen  What Year? 0 points  What month? 0 points  What time? 0 points  Count back from 20 0 points  Months in reverse 0 points  Repeat phrase 0 points  Total Score 0 points    Immunizations Immunization History  Administered Date(s) Administered   Covid-19, Mrna,Vaccine(Spikevax)15yr and older 06/05/2022   Influenza,inj,Quad PF,6+ Mos 09/15/2015, 05/18/2016, 05/22/2018, 05/13/2019, 05/14/2019, 08/16/2020   Influenza-Unspecified 06/06/2021, 05/17/2022   Moderna Covid-19 Vaccine Bivalent Booster 181yr& up 06/06/2021   PFIZER(Purple Top)SARS-COV-2 Vaccination 10/29/2019, 11/19/2019, 07/07/2020   Pneumococcal Polysaccharide-23  12/13/2020   RSV,unspecified 05/17/2022   Tdap 05/07/2015    TDAP status: Up to date  Flu Vaccine status: Up to date    Covid-19 vaccine status: Completed vaccines  Qualifies for Shingles Vaccine? Yes   Zostavax completed No   Shingrix Completed?: No.    Education has been provided regarding the importance of this vaccine. Patient has been advised to call insurance company to determine out of pocket expense if they have not yet received this vaccine. Advised may also receive vaccine at local pharmacy or Health Dept. Verbalized acceptance and understanding.  Screening Tests Health Maintenance  Topic Date Due   HIV Screening  Never done   Hepatitis C Screening  Never done   Zoster Vaccines- Shingrix (1 of 2) Never done   OPHTHALMOLOGY EXAM  02/01/2021   HEMOGLOBIN A1C  06/15/2021    Diabetic kidney evaluation - Urine ACR  08/16/2021   FOOT EXAM  08/16/2021   COVID-19 Vaccine (6 - 2023-24 season) 07/31/2022   Diabetic kidney evaluation - eGFR measurement  03/16/2023   Medicare Annual Wellness (AWV)  09/20/2023   DTaP/Tdap/Td (2 - Td or Tdap) 05/06/2025   COLONOSCOPY (Pts 45-106yr Insurance coverage will need to be confirmed)  09/16/2028   INFLUENZA VACCINE  Completed   HPV VACCINES  Aged Out    Health Maintenance  Health Maintenance Due  Topic Date Due   HIV Screening  Never done   Hepatitis C Screening  Never done   Zoster Vaccines- Shingrix (1 of 2) Never done   OPHTHALMOLOGY EXAM  02/01/2021   HEMOGLOBIN A1C  06/15/2021   Diabetic kidney evaluation - Urine ACR  08/16/2021   FOOT EXAM  08/16/2021   COVID-19 Vaccine (6 - 2023-24 season) 07/31/2022    Colorectal cancer screening: Type of screening: Colonoscopy. Completed 09/16/18. Repeat every 10 years  Mammogram status: No longer required due to not a candidate .     Additional Screening:  Hepatitis C Screening: does qualify;  Vision Screening: Recommended annual ophthalmology exams for early detection of glaucoma and other disorders of the eye. Is the patient up to date with their annual eye exam?  No  Who is the provider or what is the name of the office in which the patient attends annual eye exams? [facility]  If pt is not established with a provider, would they like to be referred to a provider to establish care? No .   Dental Screening: Recommended annual dental exams for proper oral hygiene  Community Resource Referral / Chronic Care Management: CRR required this visit?  No   CCM required this visit?  No      Plan:     I have personally reviewed and noted the following in the patient's chart:   Medical and social history Use of alcohol, tobacco or illicit drugs  Current medications and supplements including opioid prescriptions. Patient is not currently taking opioid  prescriptions. Functional ability and status Nutritional status Physical activity Advanced directives List of other physicians Hospitalizations, surgeries, and ER visits in previous 12 months Vitals Screenings to include cognitive, depression, and falls Referrals and appointments  In addition, I have reviewed and discussed with patient certain preventive protocols, quality metrics, and best practice recommendations. A written personalized care plan for preventive services as well as general preventive health recommendations were provided to patient.     TWillette Brace LPN   [id number]  Nurse Notes: none

## 2022-09-19 NOTE — Patient Instructions (Signed)
Margaret [name] , Thank you for taking time to come for your Medicare Wellness Visit. I appreciate your ongoing commitment to your health goals. Please review the following plan we discussed and let me know if I can assist you in the future.   These are the goals we discussed:  Goals      Patient Stated     Get pain out my back         This is a list of the screening recommended for you and due dates:  Health Maintenance  Topic Date Due   HIV Screening  Never done   Hepatitis C Screening: USPSTF Recommendation to screen - Ages 13-79 yo.  Never done   Zoster (Shingles) Vaccine (1 of 2) Never done   Eye exam for diabetics  02/01/2021   Hemoglobin A1C  06/15/2021   Yearly kidney health urinalysis for diabetes  08/16/2021   Complete foot exam   08/16/2021   Flu Shot  03/06/2022   COVID-19 Vaccine (5 - 2023-24 season) 04/06/2022   Yearly kidney function blood test for diabetes  03/16/2023   Medicare Annual Wellness Visit  09/20/2023   DTaP/Tdap/Td vaccine (2 - Td or Tdap) 05/06/2025   Colon Cancer Screening  09/16/2028   HPV Vaccine  Aged Out    Advanced directives: Please bring a copy of your health care power of attorney and living will to the office at your convenience.  Conditions/risks identified: get back to feeling better   Next appointment: Follow up in one year for your annual wellness visit.   Preventive Care 40-64 Years, Female Preventive care refers to lifestyle choices and visits with your health care provider that can promote health and wellness. What does preventive care include? A yearly physical exam. This is also called an annual well check. Dental exams once or twice a year. Routine eye exams. Ask your health care provider how often you should have your eyes checked. Personal lifestyle choices, including: Daily care of your teeth and gums. Regular physical activity. Eating a healthy diet. Avoiding tobacco and drug use. Limiting alcohol use. Practicing safe  sex. Taking low-dose aspirin daily starting at age 47. Taking vitamin and mineral supplements as recommended by your health care provider. What happens during an annual well check? The services and screenings done by your health care provider during your annual well check will depend on your age, overall health, lifestyle risk factors, and family history of disease. Counseling  Your health care provider may ask you questions about your: Alcohol use. Tobacco use. Drug use. Emotional well-being. Home and relationship well-being. Sexual activity. Eating habits. Work and work Statistician. Method of birth control. Menstrual cycle. Pregnancy history. Screening  You may have the following tests or measurements: Height, weight, and BMI. Blood pressure. Lipid and cholesterol levels. These may be checked every 5 years, or more frequently if you are over 24 years old. Skin check. Lung cancer screening. You may have this screening every year starting at age 81 if you have a 30-pack-year history of smoking and currently smoke or have quit within the past 15 years. Fecal occult blood test (FOBT) of the stool. You may have this test every year starting at age 23. Flexible sigmoidoscopy or colonoscopy. You may have a sigmoidoscopy every 5 years or a colonoscopy every 10 years starting at age 37. Hepatitis C blood test. Hepatitis B blood test. Sexually transmitted disease (STD) testing. Diabetes screening. This is done by checking your blood sugar (glucose) after you have  not eaten for a while (fasting). You may have this done every 1-3 years. Mammogram. This may be done every 1-2 years. Talk to your health care provider about when you should start having regular mammograms. This may depend on whether you have a family history of breast cancer. BRCA-related cancer screening. This may be done if you have a family history of breast, ovarian, tubal, or peritoneal cancers. Pelvic exam and Pap test. This  may be done every 3 years starting at age 39. Starting at age 70, this may be done every 5 years if you have a Pap test in combination with an HPV test. Bone density scan. This is done to screen for osteoporosis. You may have this scan if you are at high risk for osteoporosis. Discuss your test results, treatment options, and if necessary, the need for more tests with your health care provider. Vaccines  Your health care provider may recommend certain vaccines, such as: Influenza vaccine. This is recommended every year. Tetanus, diphtheria, and acellular pertussis (Tdap, Td) vaccine. You may need a Td booster every 10 years. Zoster vaccine. You may need this after age 42. Pneumococcal 13-valent conjugate (PCV13) vaccine. You may need this if you have certain conditions and were not previously vaccinated. Pneumococcal polysaccharide (PPSV23) vaccine. You may need one or two doses if you smoke cigarettes or if you have certain conditions. Talk to your health care provider about which screenings and vaccines you need and how often you need them. This information is not intended to replace advice given to you by your health care provider. Make sure you discuss any questions you have with your health care provider. Document Released: 08/19/2015 Document Revised: 04/11/2016 Document Reviewed: 05/24/2015 Elsevier Interactive Patient Education  2017 [employer] Prevention in the Home Falls can cause injuries. They can happen to people of all ages. There are many things you can do to make your home safe and to help prevent falls. What can I do on the outside of my home? Regularly fix the edges of walkways and driveways and fix any cracks. Remove anything that might make you trip as you walk through a door, such as a raised step or threshold. Trim any bushes or trees on the path to your home. Use bright outdoor lighting. Clear any walking paths of anything that might make someone trip, such  as rocks or tools. Regularly check to see if handrails are loose or broken. Make sure that both sides of any steps have handrails. Any raised decks and porches should have guardrails on the edges. Have any leaves, snow, or ice cleared regularly. Use sand or salt on walking paths during winter. Clean up any spills in your garage right away. This includes oil or grease spills. What can I do in the bathroom? Use night lights. Install grab bars by the toilet and in the tub and shower. Do not use towel bars as grab bars. Use non-skid mats or decals in the tub or shower. If you need to sit down in the shower, use a plastic, non-slip stool. Keep the floor dry. Clean up any water that spills on the floor as soon as it happens. Remove soap buildup in the tub or shower regularly. Attach bath mats securely with double-sided non-slip rug tape. Do not have throw rugs and other things on the floor that can make you trip. What can I do in the bedroom? Use night lights. Make sure that you have a light by your  bed that is easy to reach. Do not use any sheets or blankets that are too big for your bed. They should not hang down onto the floor. Have a firm chair that has side arms. You can use this for support while you get dressed. Do not have throw rugs and other things on the floor that can make you trip. What can I do in the kitchen? Clean up any spills right away. Avoid walking on wet floors. Keep items that you use a lot in easy-to-reach places. If you need to reach something above you, use a strong step stool that has a grab bar. Keep electrical cords out of the way. Do not use floor polish or wax that makes floors slippery. If you must use wax, use non-skid floor wax. Do not have throw rugs and other things on the floor that can make you trip. What can I do with my stairs? Do not leave any items on the stairs. Make sure that there are handrails on both sides of the stairs and use them. Fix  handrails that are broken or loose. Make sure that handrails are as long as the stairways. Check any carpeting to make sure that it is firmly attached to the stairs. Fix any carpet that is loose or worn. Avoid having throw rugs at the top or bottom of the stairs. If you do have throw rugs, attach them to the floor with carpet tape. Make sure that you have a light switch at the top of the stairs and the bottom of the stairs. If you do not have them, ask someone to add them for you. What else can I do to help prevent falls? Wear shoes that: Do not have high heels. Have rubber bottoms. Are comfortable and fit you well. Are closed at the toe. Do not wear sandals. If you use a stepladder: Make sure that it is fully opened. Do not climb a closed stepladder. Make sure that both sides of the stepladder are locked into place. Ask someone to hold it for you, if possible. Clearly mark and make sure that you can see: Any grab bars or handrails. First and last steps. Where the edge of each step is. Use tools that help you move around (mobility aids) if they are needed. These include: Canes. Walkers. Scooters. Crutches. Turn on the lights when you go into a dark area. Replace any light bulbs as soon as they burn out. Set up your furniture so you have a clear path. Avoid moving your furniture around. If any of your floors are uneven, fix them. If there are any pets around you, be aware of where they are. Review your medicines with your doctor. Some medicines can make you feel dizzy. This can increase your chance of falling. Ask your doctor what other things that you can do to help prevent falls. This information is not intended to replace advice given to you by your health care provider. Make sure you discuss any questions you have with your health care provider. Document Released: 05/19/2009 Document Revised: 12/29/2015 Document Reviewed: 08/27/2014 Elsevier Interactive Patient Education  2017  [employer].

## 2022-09-25 ENCOUNTER — Other Ambulatory Visit (HOSPITAL_COMMUNITY): Payer: Self-pay

## 2022-09-28 ENCOUNTER — Other Ambulatory Visit (HOSPITAL_COMMUNITY): Payer: Self-pay

## 2022-09-28 ENCOUNTER — Other Ambulatory Visit: Payer: Self-pay

## 2022-10-04 DIAGNOSIS — M5416 Radiculopathy, lumbar region: Secondary | ICD-10-CM | POA: Diagnosis not present

## 2022-10-05 ENCOUNTER — Telehealth: Payer: Self-pay

## 2022-10-05 ENCOUNTER — Other Ambulatory Visit: Payer: Self-pay | Admitting: Oncology

## 2022-10-05 ENCOUNTER — Other Ambulatory Visit: Payer: Self-pay

## 2022-10-05 ENCOUNTER — Other Ambulatory Visit (HOSPITAL_COMMUNITY): Payer: Self-pay

## 2022-10-05 ENCOUNTER — Other Ambulatory Visit: Payer: Self-pay | Admitting: Hematology and Oncology

## 2022-10-05 MED ORDER — OLMESARTAN-AMLODIPINE-HCTZ 20-5-12.5 MG PO TABS
ORAL_TABLET | ORAL | 0 refills | Status: DC
  Filled 2022-10-05: qty 90, 90d supply, fill #0

## 2022-10-05 NOTE — Telephone Encounter (Signed)
Called Pt regarding olmesartan-amlodipine-HCTZ rx refill request. Per Dr. Chryl Heck, we can refill now as Pt is almost out of rx but would like Pt's PCP to take over care for refills in the future. Pt verbalized understanding.

## 2022-10-24 ENCOUNTER — Other Ambulatory Visit: Payer: Self-pay | Admitting: Podiatry

## 2022-10-25 ENCOUNTER — Other Ambulatory Visit: Payer: Self-pay

## 2022-10-25 ENCOUNTER — Other Ambulatory Visit (HOSPITAL_COMMUNITY): Payer: Self-pay

## 2022-10-25 MED ORDER — MELOXICAM 15 MG PO TABS
15.0000 mg | ORAL_TABLET | Freq: Every day | ORAL | 1 refills | Status: DC
  Filled 2022-10-25: qty 30, 30d supply, fill #0
  Filled 2022-12-10: qty 30, 30d supply, fill #1

## 2022-11-16 ENCOUNTER — Other Ambulatory Visit: Payer: Self-pay

## 2022-11-16 ENCOUNTER — Other Ambulatory Visit: Payer: Self-pay | Admitting: Hematology and Oncology

## 2022-11-16 ENCOUNTER — Other Ambulatory Visit: Payer: Self-pay | Admitting: Adult Health

## 2022-11-16 ENCOUNTER — Other Ambulatory Visit (HOSPITAL_COMMUNITY): Payer: Self-pay

## 2022-11-16 DIAGNOSIS — G629 Polyneuropathy, unspecified: Secondary | ICD-10-CM

## 2022-11-16 DIAGNOSIS — C50211 Malignant neoplasm of upper-inner quadrant of right female breast: Secondary | ICD-10-CM

## 2022-11-16 DIAGNOSIS — C50011 Malignant neoplasm of nipple and areola, right female breast: Secondary | ICD-10-CM

## 2022-11-16 DIAGNOSIS — C50412 Malignant neoplasm of upper-outer quadrant of left female breast: Secondary | ICD-10-CM

## 2022-11-16 MED ORDER — GABAPENTIN 300 MG PO CAPS
600.0000 mg | ORAL_CAPSULE | Freq: Three times a day (TID) | ORAL | 2 refills | Status: DC
  Filled 2022-11-16: qty 180, 30d supply, fill #0
  Filled 2022-12-21: qty 180, 30d supply, fill #1
  Filled 2023-01-28: qty 180, 30d supply, fill #2

## 2022-11-16 MED ORDER — VENLAFAXINE HCL ER 150 MG PO CP24
150.0000 mg | ORAL_CAPSULE | Freq: Every day | ORAL | 0 refills | Status: DC
  Filled 2022-11-16: qty 90, 90d supply, fill #0

## 2022-11-16 MED ORDER — VALACYCLOVIR HCL 1 G PO TABS
1000.0000 mg | ORAL_TABLET | Freq: Two times a day (BID) | ORAL | 4 refills | Status: DC
  Filled 2022-11-16: qty 90, 45d supply, fill #0
  Filled 2023-02-15 (×2): qty 90, 45d supply, fill #1
  Filled 2023-05-16: qty 90, 45d supply, fill #2
  Filled 2023-06-26: qty 90, 45d supply, fill #3

## 2022-11-19 ENCOUNTER — Other Ambulatory Visit: Payer: Self-pay

## 2022-11-19 ENCOUNTER — Telehealth: Payer: Self-pay | Admitting: Internal Medicine

## 2022-11-19 NOTE — Telephone Encounter (Signed)
Patient states that her incontinence supplies order needs to be sent [facility] - Phone # [phone]  Patient states that [facility] has her birthdate as [birth year]

## 2022-11-20 NOTE — Telephone Encounter (Signed)
Called pt gave her MD response. Made appt for 12/06/22 @ 10:20.Marland KitchenRaechel Chute

## 2022-11-20 NOTE — Telephone Encounter (Signed)
I advised she will need CPE please schedule for her

## 2022-12-06 ENCOUNTER — Ambulatory Visit: Payer: 59 | Admitting: Internal Medicine

## 2022-12-10 ENCOUNTER — Other Ambulatory Visit: Payer: Self-pay | Admitting: Internal Medicine

## 2022-12-10 ENCOUNTER — Other Ambulatory Visit: Payer: Self-pay | Admitting: Hematology and Oncology

## 2022-12-10 ENCOUNTER — Other Ambulatory Visit (HOSPITAL_COMMUNITY): Payer: Self-pay

## 2022-12-10 DIAGNOSIS — G4720 Circadian rhythm sleep disorder, unspecified type: Secondary | ICD-10-CM

## 2022-12-11 ENCOUNTER — Other Ambulatory Visit (HOSPITAL_COMMUNITY): Payer: Self-pay

## 2022-12-11 ENCOUNTER — Other Ambulatory Visit: Payer: Self-pay

## 2022-12-11 ENCOUNTER — Ambulatory Visit (INDEPENDENT_AMBULATORY_CARE_PROVIDER_SITE_OTHER): Payer: 59 | Admitting: Internal Medicine

## 2022-12-11 ENCOUNTER — Encounter: Payer: Self-pay | Admitting: Internal Medicine

## 2022-12-11 VITALS — BP 140/86 | HR 84 | Temp 98.4°F | Ht 68.5 in | Wt 258.0 lb

## 2022-12-11 DIAGNOSIS — Z7984 Long term (current) use of oral hypoglycemic drugs: Secondary | ICD-10-CM

## 2022-12-11 DIAGNOSIS — N3941 Urge incontinence: Secondary | ICD-10-CM

## 2022-12-11 DIAGNOSIS — E1169 Type 2 diabetes mellitus with other specified complication: Secondary | ICD-10-CM

## 2022-12-11 DIAGNOSIS — F4323 Adjustment disorder with mixed anxiety and depressed mood: Secondary | ICD-10-CM | POA: Diagnosis not present

## 2022-12-11 DIAGNOSIS — R0789 Other chest pain: Secondary | ICD-10-CM | POA: Diagnosis not present

## 2022-12-11 DIAGNOSIS — Z0000 Encounter for general adult medical examination without abnormal findings: Secondary | ICD-10-CM

## 2022-12-11 DIAGNOSIS — E785 Hyperlipidemia, unspecified: Secondary | ICD-10-CM

## 2022-12-11 DIAGNOSIS — I10 Essential (primary) hypertension: Secondary | ICD-10-CM | POA: Diagnosis not present

## 2022-12-11 LAB — LIPID PANEL
Cholesterol: 99 mg/dL (ref 0–200)
HDL: 34 mg/dL — ABNORMAL LOW (ref >39.00)
LDL Cholesterol: 44 mg/dL (ref 0–99)
NonHDL: 64.91
Total CHOL/HDL Ratio: 3
Triglycerides: 107 mg/dL (ref 0.0–149.0)
VLDL: 21.4 mg/dL (ref 0.0–40.0)

## 2022-12-11 LAB — URINALYSIS, ROUTINE W REFLEX MICROSCOPIC
Bilirubin Urine: NEGATIVE
Hgb urine dipstick: NEGATIVE
Ketones, ur: NEGATIVE
Leukocytes,Ua: NEGATIVE
Nitrite: NEGATIVE
Specific Gravity, Urine: 1.02 (ref 1.000–1.030)
Total Protein, Urine: NEGATIVE
Urine Glucose: NEGATIVE
Urobilinogen, UA: 1 (ref 0.0–1.0)
pH: 6.5 (ref 5.0–8.0)

## 2022-12-11 LAB — MICROALBUMIN / CREATININE URINE RATIO
Creatinine,U: 122 mg/dL
Microalb Creat Ratio: 0.6 mg/g (ref 0.0–30.0)
Microalb, Ur: 0.8 mg/dL (ref 0.0–1.9)

## 2022-12-11 LAB — COMPREHENSIVE METABOLIC PANEL
ALT: 22 U/L (ref 0–35)
AST: 16 U/L (ref 0–37)
Albumin: 3.7 g/dL (ref 3.5–5.2)
Alkaline Phosphatase: 108 U/L (ref 39–117)
BUN: 16 mg/dL (ref 6–23)
CO2: 31 mEq/L (ref 19–32)
Calcium: 9.2 mg/dL (ref 8.4–10.5)
Chloride: 102 mEq/L (ref 96–112)
Creatinine, Ser: 0.82 mg/dL (ref 0.40–1.20)
GFR: 77.1 mL/min (ref >60.00)
Glucose, Bld: 178 mg/dL — ABNORMAL HIGH (ref 70–99)
Potassium: 3.7 mEq/L (ref 3.5–5.1)
Sodium: 139 mEq/L (ref 135–145)
Total Bilirubin: 0.2 mg/dL (ref 0.2–1.2)
Total Protein: 7.3 g/dL (ref 6.0–8.3)

## 2022-12-11 LAB — CBC
HCT: 40.1 % (ref 36.0–46.0)
Hemoglobin: 13.7 g/dL (ref 12.0–15.0)
MCHC: 34.1 g/dL (ref 30.0–36.0)
MCV: 90.9 fl (ref 78.0–100.0)
Platelets: 206 10*3/uL (ref 150.0–400.0)
RBC: 4.41 Mil/uL (ref 3.87–5.11)
RDW: 14 % (ref 11.5–15.5)
WBC: 9.1 10*3/uL (ref 4.0–10.5)

## 2022-12-11 LAB — HEMOGLOBIN A1C: Hgb A1c MFr Bld: 8.3 % — ABNORMAL HIGH (ref 4.6–6.5)

## 2022-12-11 MED ORDER — ATORVASTATIN CALCIUM 20 MG PO TABS
20.0000 mg | ORAL_TABLET | Freq: Every day | ORAL | 0 refills | Status: DC
  Filled 2022-12-11: qty 90, 90d supply, fill #0

## 2022-12-11 MED ORDER — TRAMADOL HCL 50 MG PO TABS
50.0000 mg | ORAL_TABLET | Freq: Every day | ORAL | 0 refills | Status: AC | PRN
  Filled 2022-12-11: qty 5, 5d supply, fill #0

## 2022-12-11 MED ORDER — OLMESARTAN-AMLODIPINE-HCTZ 20-5-12.5 MG PO TABS
1.0000 | ORAL_TABLET | Freq: Every day | ORAL | 3 refills | Status: DC
  Filled 2022-12-11 – 2022-12-21 (×2): qty 90, 90d supply, fill #0
  Filled 2023-04-03: qty 90, 90d supply, fill #1
  Filled 2023-06-26: qty 90, 90d supply, fill #2
  Filled 2023-08-02 – 2023-10-02 (×2): qty 90, 90d supply, fill #3

## 2022-12-11 NOTE — Progress Notes (Signed)
   Subjective:   Patient ID: Margaret [name], female    DOB: 1960-11-09, 62 y.o.   MRN: [id number]  HPI The patient is here for physical. With new concerns see A/P for details.   PMH, [facility], social history reviewed and updated  Review of Systems  Constitutional: Negative.   HENT: Negative.    Eyes: Negative.   Respiratory:  Negative for cough, chest tightness and shortness of breath.   Cardiovascular:  Negative for chest pain, palpitations and leg swelling.  Gastrointestinal:  Negative for abdominal distention, abdominal pain, constipation, diarrhea, nausea and vomiting.  Musculoskeletal:  Positive for myalgias.  Skin: Negative.   Neurological: Negative.   Psychiatric/Behavioral: Negative.      Objective:  Physical Exam Constitutional:      Appearance: She is well-developed.  HENT:     Head: Normocephalic and atraumatic.  Cardiovascular:     Rate and Rhythm: Normal rate and regular rhythm.  Pulmonary:     Effort: Pulmonary effort is normal. No respiratory distress.     Breath sounds: Normal breath sounds. No wheezing or rales.     Comments: Chest wall tenderness stable Chest:     Chest wall: Tenderness present.  Abdominal:     General: Bowel sounds are normal. There is no distension.     Palpations: Abdomen is soft.     Tenderness: There is no abdominal tenderness. There is no rebound.  Musculoskeletal:        General: Tenderness present.     Cervical back: Normal range of motion.  Skin:    General: Skin is warm and dry.  Neurological:     Mental Status: She is alert and oriented to person, place, and time.     Coordination: Coordination normal.     Vitals:   12/11/22 1026 12/11/22 1032  BP: (!) 140/86 (!) 140/86  Pulse: 84   Temp: 98.4 F (36.9 C)   TempSrc: Oral   SpO2: 90%   Weight: 258 lb (117 kg)   Height: 5' 8.5" (1.74 m)     Assessment & Plan:

## 2022-12-11 NOTE — Patient Instructions (Signed)
We have sent in the tramadol for the pain. We have done labs today to check the sugars and cholesterol.

## 2022-12-14 ENCOUNTER — Other Ambulatory Visit: Payer: Self-pay

## 2022-12-14 ENCOUNTER — Other Ambulatory Visit: Payer: Self-pay | Admitting: Internal Medicine

## 2022-12-14 MED ORDER — SEMAGLUTIDE (2 MG/DOSE) 8 MG/3ML ~~LOC~~ SOPN
2.0000 mg | PEN_INJECTOR | SUBCUTANEOUS | 1 refills | Status: DC
  Filled 2022-12-14: qty 3, fill #0

## 2022-12-14 MED ORDER — SEMAGLUTIDE (1 MG/DOSE) 4 MG/3ML ~~LOC~~ SOPN
1.0000 mg | PEN_INJECTOR | SUBCUTANEOUS | 0 refills | Status: DC
  Filled 2022-12-14: qty 3, 28d supply, fill #0

## 2022-12-14 MED ORDER — OZEMPIC (0.25 OR 0.5 MG/DOSE) 2 MG/3ML ~~LOC~~ SOPN
PEN_INJECTOR | SUBCUTANEOUS | 0 refills | Status: DC
  Filled 2022-12-14: qty 3, 42d supply, fill #0
  Filled 2023-01-28: qty 3, 28d supply, fill #1

## 2022-12-14 NOTE — Assessment & Plan Note (Signed)
Checking CMP and adjust as needed olmesartan/amlodipine/hctz 20/5/12.5 mg daily. BP mildly elevated today but normal all other recent visits. Did not take meds today.

## 2022-12-14 NOTE — Assessment & Plan Note (Signed)
BMI 38 and complicated by urge incontinence and hypertension. Counseled on weight loss with diet and exercise.

## 2022-12-14 NOTE — Assessment & Plan Note (Signed)
She does have new pulling sensation with urination checking U/A. She does require incontinence supplies will fill out forms for her. She has not tried any medication for this in the past.

## 2022-12-14 NOTE — Assessment & Plan Note (Signed)
Checking lipid panel and adjust lipitor 20 mg daily as needed for LDL<100 goal. 

## 2022-12-14 NOTE — Assessment & Plan Note (Signed)
Taking effexor 150 mg daily and well controlled. Some increase in life stress but stable. Continue at same dose.

## 2022-12-14 NOTE — Assessment & Plan Note (Signed)
Flu shot yearly. Shingrix due at pharmacy. Tetanus up to date. Colonoscopy up to date. Mammogram up to date, pap smear up to date. Counseled about sun safety and mole surveillance. Counseled about the dangers of distracted driving. Given 10 year screening recommendations.

## 2022-12-14 NOTE — Assessment & Plan Note (Signed)
Checking HgA1c, foot exam up to date, reminded about eye exam. Checking microalbumin to creatinine ratio. Adjust amaryl 4 mg daily as needed. On ARB and statin.

## 2022-12-14 NOTE — Assessment & Plan Note (Signed)
Previously used tramadol 50 mg daily for pain. Rx 5 day supply since she has been off for some time to ensure this is still working. If effective she will call us and we can send in 30 day supply with refills.

## 2022-12-17 ENCOUNTER — Other Ambulatory Visit (HOSPITAL_COMMUNITY): Payer: Self-pay

## 2022-12-17 ENCOUNTER — Other Ambulatory Visit: Payer: Self-pay

## 2022-12-18 ENCOUNTER — Telehealth: Payer: Self-pay | Admitting: Internal Medicine

## 2022-12-18 NOTE — Telephone Encounter (Signed)
Patient called and said Dr. Okey Dupre gave her a temporary prescription for tramadol. She was told to let Dr. Okey Dupre know if it works for her. The patient said it works and would like to know if Dr. Okey Dupre could send in an updated prescription for her to continue taking it. She would like for it to be sent to [facility]. Best callback is [phone].

## 2022-12-18 NOTE — Telephone Encounter (Signed)
I don't see listed how often patient is using medication can we confirm this information. Thanks

## 2022-12-19 ENCOUNTER — Telehealth: Payer: Self-pay | Admitting: Internal Medicine

## 2022-12-19 NOTE — Telephone Encounter (Signed)
Pt called in wanting to know since she started Ozempic do she need to stop Glimepiride.

## 2022-12-19 NOTE — Telephone Encounter (Signed)
Pt says she take it 1x in the morning

## 2022-12-19 NOTE — Telephone Encounter (Signed)
I would recommend to continue glipizide. Also did we also ask about other pending telephone message so we can resolve that one also?

## 2022-12-20 ENCOUNTER — Other Ambulatory Visit: Payer: Self-pay

## 2022-12-20 MED ORDER — TRAMADOL HCL 50 MG PO TABS
50.0000 mg | ORAL_TABLET | Freq: Every day | ORAL | 5 refills | Status: DC | PRN
  Filled 2022-12-20: qty 30, 30d supply, fill #0
  Filled 2023-01-15: qty 30, 30d supply, fill #1
  Filled 2023-02-20: qty 30, 30d supply, fill #2
  Filled 2023-04-03: qty 30, 30d supply, fill #3
  Filled 2023-06-04: qty 30, 30d supply, fill #4

## 2022-12-20 NOTE — Telephone Encounter (Signed)
Spoke with patient and there were no question or concerns she understood to do follow up appointments

## 2022-12-20 NOTE — Telephone Encounter (Addendum)
Call patient and left a voicemail.

## 2022-12-20 NOTE — Addendum Note (Signed)
Addended by: Hillard Danker A on: 12/20/2022 07:30 AM   Modules accepted: Orders

## 2022-12-20 NOTE — Telephone Encounter (Signed)
Sent in, she will need regular follow up visits at least every 6 months to continue on this medication.

## 2022-12-21 ENCOUNTER — Other Ambulatory Visit (HOSPITAL_COMMUNITY): Payer: Self-pay

## 2023-01-03 ENCOUNTER — Telehealth: Payer: Self-pay | Admitting: Internal Medicine

## 2023-01-03 DIAGNOSIS — M5416 Radiculopathy, lumbar region: Secondary | ICD-10-CM | POA: Diagnosis not present

## 2023-01-03 NOTE — Telephone Encounter (Signed)
A representative from [facility] called to see if we had received their faxes. The most recent ones were sent 5/10 and 5/22. If not received, please call back at [phone].

## 2023-01-04 NOTE — Telephone Encounter (Signed)
Received and will be fax over again

## 2023-01-15 ENCOUNTER — Encounter: Payer: Self-pay | Admitting: Podiatry

## 2023-01-15 ENCOUNTER — Other Ambulatory Visit (HOSPITAL_COMMUNITY): Payer: Self-pay

## 2023-01-15 ENCOUNTER — Ambulatory Visit (INDEPENDENT_AMBULATORY_CARE_PROVIDER_SITE_OTHER): Payer: 59 | Admitting: Podiatry

## 2023-01-15 ENCOUNTER — Other Ambulatory Visit: Payer: Self-pay

## 2023-01-15 DIAGNOSIS — M76822 Posterior tibial tendinitis, left leg: Secondary | ICD-10-CM | POA: Diagnosis not present

## 2023-01-15 MED ORDER — MELOXICAM 15 MG PO TABS
15.0000 mg | ORAL_TABLET | Freq: Every day | ORAL | 3 refills | Status: AC
  Filled 2023-01-15: qty 30, 30d supply, fill #0
  Filled 2023-02-15 (×2): qty 30, 30d supply, fill #1
  Filled 2023-03-15: qty 30, 30d supply, fill #2
  Filled 2023-05-16: qty 30, 30d supply, fill #3

## 2023-01-15 MED ORDER — TRIAMCINOLONE ACETONIDE 40 MG/ML IJ SUSP
20.0000 mg | Freq: Once | INTRAMUSCULAR | Status: AC
Start: 2023-01-15 — End: 2023-01-15
  Administered 2023-01-15: 20 mg

## 2023-01-15 NOTE — Telephone Encounter (Signed)
Patient called and said [facility] still has not received the fax. They sent another fax today 01/15/2023.

## 2023-01-15 NOTE — Progress Notes (Signed)
She presents today for follow-up of her posterior tibial tendinitis states that the shot did really well until it wore off.  States that she would like to go ahead and get started on physical therapy.  Objective: Vital signs stable alert oriented x 3 she has some swelling with tenderness on palpation of the posterior tibial tendon pulses are palpable.  She has good inversion against resistance but tender.  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  Plan: At this point we once again injected 5 mg of Kenalog and local anesthetic to the point of maximal tenderness.  Tolerated procedure well without complications.  We are going to [facility] should she have questions or concerns she will notify us immediately otherwise follow-up with her once that is complete.

## 2023-01-16 NOTE — Telephone Encounter (Signed)
I have refax this over again

## 2023-01-19 ENCOUNTER — Other Ambulatory Visit (HOSPITAL_COMMUNITY): Payer: Self-pay

## 2023-01-21 ENCOUNTER — Telehealth: Payer: Self-pay | Admitting: Internal Medicine

## 2023-01-21 ENCOUNTER — Other Ambulatory Visit (HOSPITAL_COMMUNITY): Payer: Self-pay

## 2023-01-21 MED ORDER — ONETOUCH DELICA PLUS LANCET33G MISC
0 refills | Status: AC
  Filled 2023-01-21: qty 100, 50d supply, fill #0

## 2023-01-21 MED ORDER — GLUCOSE BLOOD VI STRP
ORAL_STRIP | 0 refills | Status: AC
  Filled 2023-01-21: qty 100, 50d supply, fill #0

## 2023-01-21 NOTE — Telephone Encounter (Signed)
Prescription Request  01/21/2023  LOV: 12/11/2022  What is the name of the medication or equipment?  Lancets (ONETOUCH DELICA PLUS LANCET33G)   glucose blood test strip  Have you contacted your pharmacy to request a refill? No   Which pharmacy would you like this sent to?  [facility] [address] [place] [address] Phone: [phone] Fax: [phone]    Patient notified that their request is being sent to the clinical staff for review and that they should receive a response within 2 business days.   Please advise at Mobile [phone] (mobile)

## 2023-01-21 NOTE — Telephone Encounter (Signed)
Pt has appt on 03/26/23. Sent enough until appt...Raechel Chute

## 2023-01-22 ENCOUNTER — Other Ambulatory Visit: Payer: Self-pay

## 2023-01-22 ENCOUNTER — Other Ambulatory Visit (HOSPITAL_COMMUNITY): Payer: Self-pay

## 2023-01-28 ENCOUNTER — Other Ambulatory Visit (HOSPITAL_COMMUNITY): Payer: Self-pay

## 2023-02-04 NOTE — Therapy (Unsigned)
OUTPATIENT PHYSICAL THERAPY LOWER EXTREMITY EVALUATION   Patient Name: Margaret [name] MRN: [id number] DOB:21-Mar-1961, 62 y.o., female Today's Date: 02/05/2023  END OF SESSION:  PT End of Session - 02/05/23 1236     Visit Number 1    Number of Visits 12    Date for PT Re-Evaluation 04/02/23    Authorization Type [employer] MCD    PT Start Time 1230    PT Stop Time 1315    PT Time Calculation (min) 45 min    Activity Tolerance Patient tolerated treatment well    Behavior During Therapy WFL for tasks assessed/performed             Past Medical History:  Diagnosis Date   Allergy    Anxiety    Arthritis    Bilateral breast cancer (HCC)    Breast cancer (HCC)    Breast cancer of upper-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) 04/21/2015   Depression    Diabetes mellitus without complication (HCC)    GERD (gastroesophageal reflux disease)    Hypertension    Past Surgical History:  Procedure Laterality Date   ABDOMINAL HYSTERECTOMY  1990   MASTECTOMY MODIFIED RADICAL Left 05/09/2015   MASTECTOMY MODIFIED RADICAL Left 05/09/2015   Procedure: LEFT MODIFIED RADICAL MASTETCTOMY;  Surgeon: Chevis Pretty III, MD;  Location: [facility];  Service: General;  Laterality: Left;   MASTECTOMY W/ SENTINEL NODE BIOPSY Right    MASTECTOMY W/ SENTINEL NODE BIOPSY Right 05/09/2015   Procedure: RIGHT MASTECTOMY WITH RIGHT AXILLARY SENTINEL LYMPH NODE BIOPSY;  Surgeon: Chevis Pretty III, MD;  Location: [facility];  Service: General;  Laterality: Right;   PORTACATH PLACEMENT Right 09/01/2015   Procedure: INSERTION PORT-A-CATH;  Surgeon: Chevis Pretty III, MD;  Location: [facility];  Service: General;  Laterality: Right;   TUBAL LIGATION  1984   Patient Active Problem List   Diagnosis Date Noted   Neck pain 03/31/2021   Lumbar radiculopathy 12/01/2019   Bilateral hip pain 12/01/2019   Urge incontinence 12/01/2019   Hyperlipidemia associated with type 2 diabetes mellitus (HCC) 05/19/2019   Diabetes  mellitus (HCC) 07/15/2018   Routine general medical examination at a health care facility 07/15/2018   Adjustment disorder with mixed anxiety and depressed mood 07/17/2016   Morbid obesity (HCC) 11/28/2015   Chest wall pain 11/28/2015   Malignant neoplasm of upper-inner quadrant of right breast in female, estrogen receptor positive (HCC) 04/27/2015   Hypertension, essential 04/27/2015   Malignant neoplasm of upper-outer quadrant of left breast in female, estrogen receptor positive (HCC) 04/21/2015    PCP: Hillard Danker MD   REFERRING PROVIDER: Elinor Parkinson, [place]  REFERRING DIAG: [phone] (ICD-10-CM) - Posterior tibial tendon dysfunction (PTTD) of left lower extremity  THERAPY DIAG:  Pain in left ankle and joints of left foot  Tibialis posterior dysfunction  Rationale for Evaluation and Treatment: Rehabilitation  ONSET DATE: chronic   SUBJECTIVE:   SUBJECTIVE STATEMENT: It hurts so bad.  I thought I twisted it. I work on my feet a lot and that is hard.  It was really swollen when I first saw the doctor.  When I get up in the AM I can barely walk.  Especially if I work the day before.  He gave me an injection and that did help a small amount. I wore a boot for about 4 weeks.  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  It does interfere with her ability to work and tolerate activity.   PERTINENT HISTORY:  Posterior tibial tendon dysfunction without tear per MRI. Breast cancer  Diabetes Back pain   PAIN:  Are you having pain? Yes: NPRS scale: 9/10 Pain location: L inner aspect of leg and foot  Pain description: cutting, stab Aggravating factors: standing, walking  Relieving factors: massaging it, does not do exercise, ice/biorfreeze   PRECAUTIONS: None  WEIGHT BEARING RESTRICTIONS: No  FALLS:  Has patient fallen in last 6 months? No  LIVING ENVIRONMENT: Lives with: lives with their family Lives in:  House/apartment Stairs: Yes: External: 12 steps; on right going up Has following equipment at home: None  OCCUPATION: Part time   PLOF: Independent  PATIENT GOALS: Pt would like to be able to learn how to get the pain down and do it on my own.     OBJECTIVE:   DIAGNOSTIC FINDINGS: neg MRI   PATIENT SURVEYS:  FOTO 49%  COGNITION: Overall cognitive status: Within functional limits for tasks assessed     SENSATION: WFL  EDEMA:  Figure 8: Rt 58 cm, Lt 59 cm    POSTURE: rounded shoulders, forward head, and pes planus bilateral   PALPATION: Tender to palpation along medial lower leg and distally to medial malleolus and surrounding area   LOWER EXTREMITY ROM:  Active ROM Right eval Left eval  Hip flexion    Hip extension    Hip abduction    Hip adduction    Hip internal rotation    Hip external rotation    Knee flexion    Knee extension    Ankle dorsiflexion  8  Ankle plantarflexion  35  Ankle inversion 35 30  Ankle eversion 15 14   (Blank rows = not tested)  LOWER EXTREMITY MMT:  MMT Right eval Left eval  Hip flexion    Hip extension    Hip abduction    Hip adduction    Hip internal rotation    Hip external rotation    Knee flexion    Knee extension    Ankle dorsiflexion    Ankle plantarflexion    Ankle inversion    Ankle eversion     (Blank rows = not tested)  LOWER EXTREMITY SPECIAL TESTS:  NT  FUNCTIONAL TESTS:  5 times sit to stand: 15 sec  Unable to do any SLS in full WB on LLE, Rt LE about 3 sec   GAIT: Distance walked: 100 feet  Assistive device utilized: None Level of assistance: Modified independence Comments: slow pace, limp    TODAY'S TREATMENT:                                                                                                                              DATE: 02/05/23  PT eval and establish HEP   PATIENT EDUCATION:  Education details: HEP, anatomy, orthotics needed  Person educated: Patient Education  method: Explanation, Demonstration, and Handouts  Education comprehension: verbalized understanding and needs further education  HOME EXERCISE PROGRAM: Access Code: [email or web address] URL: [email or web address] Date: 02/05/2023 Prepared by: Karie Mainland  Exercises - Towel Scrunches  - 2 x daily - 7 x weekly - 2 sets - 10 reps - 5-10 hold - Seated Figure 4 Ankle Inversion with Resistance  - 2 x daily - 7 x weekly - 2 sets - 10 reps - 5 hold - Tibialis Posterior Stretch at Wall  - 1 x daily - 7 x weekly - 1 sets - 5 reps - 30 hold - Seated Heel Raise  - 1 x daily - 7 x weekly - 2 sets - 10 reps - 5 hold  ASSESSMENT:  CLINICAL IMPRESSION: Patient is a 62 y.o. female who was seen today for physical therapy evaluation and treatment for Lt posterior tibial tendonitis. Patient is in need of custom orthotics.  She was given information on how to obtain them.   OBJECTIVE IMPAIRMENTS: decreased activity tolerance, decreased balance, decreased mobility, difficulty walking, decreased ROM, decreased strength, increased edema, increased fascial restrictions, postural dysfunction, obesity, and pain.   ACTIVITY LIMITATIONS: carrying, standing, stairs, transfers, locomotion level, and caring for others  PARTICIPATION LIMITATIONS: shopping, community activity, and occupation  PERSONAL FACTORS: Time since onset of injury/illness/exacerbation and 3+ comorbidities: obesity, diabetes, back pain   are also affecting patient's functional outcome.   REHAB POTENTIAL: Good  CLINICAL DECISION MAKING: Stable/uncomplicated  EVALUATION COMPLEXITY: Low   GOALS: Goals reviewed with patient? Yes    LONG TERM GOALS: Target date: 03/20/2023   Pt will be able to show I with HEP for ankle, foot  Baseline: given on eval  Goal status: INITIAL  2.  Pt will able to walk and stand for work and pain no more than moderate (5/10-6/10) most of the time  Baseline:  Goal status: INITIAL  3.  Pt will be able to  obtain orthotics and tolerate wearing them 2-3 hours a day  Baseline:  Goal status: INITIAL  4.  Pt will be able to have no more than min pain at rest and on days off (<3/10) Baseline:  Goal status: INITIAL  5.  FOTO score will improve to 58% indicated improved functional mobility  Baseline: 49% Goal status: INITIAL    PLAN:  PT FREQUENCY: 2x/week  PT DURATION: 6 weeks  PLANNED INTERVENTIONS: Therapeutic exercises, Therapeutic activity, Neuromuscular re-education, Balance training, Gait training, Patient/Family education, Self Care, Joint mobilization, Cryotherapy, Moist heat, Taping, Manual therapy, and Re-evaluation  PLAN FOR NEXT SESSION: check HEP, Nustep, manual as tol.    Chantal Worthey, PT 02/05/2023, 1:34 PM   Karie Mainland, PT 02/06/23 11:49 AM Phone: [phone] Fax: [phone]

## 2023-02-05 ENCOUNTER — Encounter: Payer: Self-pay | Admitting: Physical Therapy

## 2023-02-05 ENCOUNTER — Ambulatory Visit: Payer: 59 | Attending: Podiatry | Admitting: Physical Therapy

## 2023-02-05 DIAGNOSIS — M76829 Posterior tibial tendinitis, unspecified leg: Secondary | ICD-10-CM | POA: Insufficient documentation

## 2023-02-05 DIAGNOSIS — M25572 Pain in left ankle and joints of left foot: Secondary | ICD-10-CM | POA: Diagnosis not present

## 2023-02-05 DIAGNOSIS — M76822 Posterior tibial tendinitis, left leg: Secondary | ICD-10-CM | POA: Diagnosis not present

## 2023-02-05 DIAGNOSIS — R262 Difficulty in walking, not elsewhere classified: Secondary | ICD-10-CM | POA: Diagnosis not present

## 2023-02-15 ENCOUNTER — Other Ambulatory Visit: Payer: Self-pay

## 2023-02-15 ENCOUNTER — Other Ambulatory Visit (HOSPITAL_COMMUNITY): Payer: Self-pay

## 2023-02-15 ENCOUNTER — Other Ambulatory Visit: Payer: Self-pay | Admitting: Hematology and Oncology

## 2023-02-15 DIAGNOSIS — C50211 Malignant neoplasm of upper-inner quadrant of right female breast: Secondary | ICD-10-CM

## 2023-02-15 DIAGNOSIS — C50412 Malignant neoplasm of upper-outer quadrant of left female breast: Secondary | ICD-10-CM

## 2023-02-15 DIAGNOSIS — G4720 Circadian rhythm sleep disorder, unspecified type: Secondary | ICD-10-CM

## 2023-02-15 MED ORDER — TRAZODONE HCL 50 MG PO TABS
100.0000 mg | ORAL_TABLET | Freq: Every evening | ORAL | 0 refills | Status: DC
Start: 2023-02-15 — End: 2023-03-15
  Filled 2023-02-15: qty 60, 30d supply, fill #0

## 2023-02-15 MED ORDER — VENLAFAXINE HCL ER 150 MG PO CP24
150.0000 mg | ORAL_CAPSULE | Freq: Every day | ORAL | 0 refills | Status: DC
Start: 2023-02-15 — End: 2023-03-26
  Filled 2023-02-15: qty 90, 90d supply, fill #0

## 2023-02-16 ENCOUNTER — Other Ambulatory Visit (HOSPITAL_COMMUNITY): Payer: Self-pay

## 2023-02-18 NOTE — Therapy (Unsigned)
OUTPATIENT PHYSICAL THERAPY LOWER EXTREMITY EVALUATION   Patient Name: Margaret [name] MRN: [id number] DOB:1961-05-09, 62 y.o., female Today's Date: 02/19/2023  END OF SESSION:  PT End of Session - 02/19/23 1234     Visit Number 2    Number of Visits 12    Date for PT Re-Evaluation 04/02/23    Authorization Type [employer] MCD    PT Start Time 1232    PT Stop Time 1315    PT Time Calculation (min) 43 min    Activity Tolerance Patient tolerated treatment well    Behavior During Therapy WFL for tasks assessed/performed              Past Medical History:  Diagnosis Date   Allergy    Anxiety    Arthritis    Bilateral breast cancer (HCC)    Breast cancer (HCC)    Breast cancer of upper-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) 04/21/2015   Depression    Diabetes mellitus without complication (HCC)    GERD (gastroesophageal reflux disease)    Hypertension    Past Surgical History:  Procedure Laterality Date   ABDOMINAL HYSTERECTOMY  1990   MASTECTOMY MODIFIED RADICAL Left 05/09/2015   MASTECTOMY MODIFIED RADICAL Left 05/09/2015   Procedure: LEFT MODIFIED RADICAL MASTETCTOMY;  Surgeon: Chevis Pretty III, MD;  Location: [facility];  Service: General;  Laterality: Left;   MASTECTOMY W/ SENTINEL NODE BIOPSY Right    MASTECTOMY W/ SENTINEL NODE BIOPSY Right 05/09/2015   Procedure: RIGHT MASTECTOMY WITH RIGHT AXILLARY SENTINEL LYMPH NODE BIOPSY;  Surgeon: Chevis Pretty III, MD;  Location: [facility];  Service: General;  Laterality: Right;   PORTACATH PLACEMENT Right 09/01/2015   Procedure: INSERTION PORT-A-CATH;  Surgeon: Chevis Pretty III, MD;  Location: [facility];  Service: General;  Laterality: Right;   TUBAL LIGATION  1984   Patient Active Problem List   Diagnosis Date Noted   Neck pain 03/31/2021   Lumbar radiculopathy 12/01/2019   Bilateral hip pain 12/01/2019   Urge incontinence 12/01/2019   Hyperlipidemia associated with type 2 diabetes mellitus (HCC) 05/19/2019   Diabetes  mellitus (HCC) 07/15/2018   Routine general medical examination at a health care facility 07/15/2018   Adjustment disorder with mixed anxiety and depressed mood 07/17/2016   Morbid obesity (HCC) 11/28/2015   Chest wall pain 11/28/2015   Malignant neoplasm of upper-inner quadrant of right breast in female, estrogen receptor positive (HCC) 04/27/2015   Hypertension, essential 04/27/2015   Malignant neoplasm of upper-outer quadrant of left breast in female, estrogen receptor positive (HCC) 04/21/2015    PCP: Hillard Danker MD   REFERRING PROVIDER: Elinor Parkinson, [place]  REFERRING DIAG: [phone] (ICD-10-CM) - Posterior tibial tendon dysfunction (PTTD) of left lower extremity  THERAPY DIAG:  Tibialis posterior dysfunction  Pain in left ankle and joints of left foot  Difficulty in walking, not elsewhere classified  Rationale for Evaluation and Treatment: Rehabilitation  ONSET DATE: chronic   SUBJECTIVE:   SUBJECTIVE STATEMENT: It is a little better. Pain 3/10.  I went to the [employer] store and it is so expensive.   PERTINENT HISTORY:  Posterior tibial tendon dysfunction without tear per MRI. Breast cancer  Diabetes Back pain   PAIN:  Are you having pain? Yes: NPRS scale: 3/10 Pain location: L inner aspect of leg and foot  Pain description: cutting, stab Aggravating factors: standing, walking  Relieving factors: massaging it, does not do exercise, ice/biorfreeze   PRECAUTIONS: None  WEIGHT BEARING  RESTRICTIONS: No  FALLS:  Has patient fallen in last 6 months? No  LIVING ENVIRONMENT: Lives with: lives with their family Lives in: House/apartment Stairs: Yes: External: 12 steps; on right going up Has following equipment at home: None  OCCUPATION: Part time   PLOF: Independent  PATIENT GOALS: Pt would like to be able to learn how to get the pain down and do it on my own.   OBJECTIVE:   DIAGNOSTIC FINDINGS: neg MRI   PATIENT SURVEYS:  FOTO  49%  COGNITION: Overall cognitive status: Within functional limits for tasks assessed     SENSATION: WFL  EDEMA:  Figure 8: Rt 58 cm, Lt 59 cm    POSTURE: rounded shoulders, forward head, and pes planus bilateral   PALPATION: Tender to palpation along medial lower leg and distally to medial malleolus and surrounding area   LOWER EXTREMITY ROM:  Active ROM Right eval Left eval  Hip flexion    Hip extension    Hip abduction    Hip adduction    Hip internal rotation    Hip external rotation    Knee flexion    Knee extension    Ankle dorsiflexion  8  Ankle plantarflexion  35  Ankle inversion 35 30  Ankle eversion 15 14   (Blank rows = not tested)  LOWER EXTREMITY MMT:  MMT Right eval Left eval  Hip flexion    Hip extension    Hip abduction    Hip adduction    Hip internal rotation    Hip external rotation    Knee flexion    Knee extension    Ankle dorsiflexion    Ankle plantarflexion    Ankle inversion    Ankle eversion     (Blank rows = not tested)  LOWER EXTREMITY SPECIAL TESTS:  NT  FUNCTIONAL TESTS:  5 times sit to stand: 15 sec  Unable to do any SLS in full WB on LLE, Rt LE about 3 sec   GAIT: Distance walked: 100 feet  Assistive device utilized: None Level of assistance: Modified independence Comments: slow pace, limp    TODAY'S TREATMENT:      [facility] Adult PT Treatment:                                                DATE: 02/19/23 Therapeutic Exercise: Seated towel scrunches for arch  Seated heel raise, toe raise Ankle circles Red band inversion seated x 2 x 15  Standing balance: eyes closed, head turns and nods Tandem on [employer]  Heel raises 3 x 10 toe in, out and neutral  Manual therapy PROM L ankle Soft tissue mobilization along arch and along post tib tendon  DATE: 02/05/23  PT eval and establish HEP   PATIENT  EDUCATION:  Education details: HEP, anatomy, orthotics needed  Person educated: Patient Education method: Explanation, Demonstration, and Handouts Education comprehension: verbalized understanding and needs further education  HOME EXERCISE PROGRAM: Access Code: [email or web address] URL: [email or web address] Date: 02/05/2023 Prepared by: Karie Mainland  Exercises - Towel Scrunches  - 2 x daily - 7 x weekly - 2 sets - 10 reps - 5-10 hold - Seated Figure 4 Ankle Inversion with Resistance  - 2 x daily - 7 x weekly - 2 sets - 10 reps - 5 hold - Tibialis Posterior Stretch at Wall  - 1 x daily - 7 x weekly - 1 sets - 5 reps - 30 hold - Seated Heel Raise  - 1 x daily - 7 x weekly - 2 sets - 10 reps - 5 hold  ASSESSMENT:  CLINICAL IMPRESSION:  Reviewed home program and provided necessary cues. She is still interested in getting an orthotic but within a more reasonable price.  Pain began to work on standing, balance and proprioception.  L hip held in ER with walking and rest, asked her to pay attention to this as she walks. Cont POC. Asked her to wear more supportive shoes and pants, shorts for more dynamic standing work next visit.   OBJECTIVE IMPAIRMENTS: decreased activity tolerance, decreased balance, decreased mobility, difficulty walking, decreased ROM, decreased strength, increased edema, increased fascial restrictions, postural dysfunction, obesity, and pain.   ACTIVITY LIMITATIONS: carrying, standing, stairs, transfers, locomotion level, and caring for others  PARTICIPATION LIMITATIONS: shopping, community activity, and occupation  PERSONAL FACTORS: Time since onset of injury/illness/exacerbation and 3+ comorbidities: obesity, diabetes, back pain   are also affecting patient's functional outcome.   REHAB POTENTIAL: Good  CLINICAL DECISION MAKING: Stable/uncomplicated  EVALUATION COMPLEXITY: Low   GOALS: Goals reviewed with patient? Yes    LONG TERM GOALS: Target date:  03/20/2023   Pt will be able to show I with HEP for ankle, foot  Baseline: given on eval , cues  Goal status: ongoing   2.  Pt will able to walk and stand for work and pain no more than moderate (5/10-6/10) most of the time  Baseline:  Goal status: INITIAL  3.  Pt will be able to obtain orthotics and tolerate wearing them 2-3 hours a day  Baseline:  Goal status: INITIAL  4.  Pt will be able to have no more than min pain at rest and on days off (<3/10) Baseline:  Goal status: INITIAL  5.  FOTO score will improve to 58% indicated improved functional mobility  Baseline: 49% Goal status: INITIAL    PLAN:  PT FREQUENCY: 2x/week  PT DURATION: 6 weeks  PLANNED INTERVENTIONS: Therapeutic exercises, Therapeutic activity, Neuromuscular re-education, Balance training, Gait training, Patient/Family education, Self Care, Joint mobilization, Cryotherapy, Moist heat, Taping, Manual therapy, and Re-evaluation  PLAN FOR NEXT SESSION: check HEP, Nustep, manual as tol. Standing balance and strength in plantarflex.    Jann Milkovich, PT 02/19/2023, 1:35 PM   Karie Mainland, PT 02/19/23 1:35 PM Phone: [phone] Fax: [phone]

## 2023-02-19 ENCOUNTER — Encounter: Payer: Self-pay | Admitting: Physical Therapy

## 2023-02-19 ENCOUNTER — Ambulatory Visit: Payer: 59 | Admitting: Physical Therapy

## 2023-02-19 DIAGNOSIS — M76822 Posterior tibial tendinitis, left leg: Secondary | ICD-10-CM | POA: Diagnosis not present

## 2023-02-19 DIAGNOSIS — M76829 Posterior tibial tendinitis, unspecified leg: Secondary | ICD-10-CM

## 2023-02-19 DIAGNOSIS — R262 Difficulty in walking, not elsewhere classified: Secondary | ICD-10-CM

## 2023-02-19 DIAGNOSIS — M25572 Pain in left ankle and joints of left foot: Secondary | ICD-10-CM | POA: Diagnosis not present

## 2023-02-20 ENCOUNTER — Other Ambulatory Visit (HOSPITAL_COMMUNITY): Payer: Self-pay

## 2023-02-20 NOTE — Therapy (Signed)
OUTPATIENT PHYSICAL THERAPY LOWER EXTREMITY TREATMENT NOTE   Patient Name: Margaret [name] MRN: [id number] DOB:12-Apr-1961, 62 y.o., female Today's Date: 02/21/2023  END OF SESSION:  PT End of Session - 02/21/23 1526     Visit Number 3    Number of Visits 12    Date for PT Re-Evaluation 04/02/23    Authorization Type [employer] MCD    PT Start Time 1420    PT Stop Time 1505    PT Time Calculation (min) 45 min    Activity Tolerance Patient tolerated treatment well    Behavior During Therapy WFL for tasks assessed/performed               Past Medical History:  Diagnosis Date   Allergy    Anxiety    Arthritis    Bilateral breast cancer (HCC)    Breast cancer (HCC)    Breast cancer of upper-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) 04/21/2015   Depression    Diabetes mellitus without complication (HCC)    GERD (gastroesophageal reflux disease)    Hypertension    Past Surgical History:  Procedure Laterality Date   ABDOMINAL HYSTERECTOMY  1990   MASTECTOMY MODIFIED RADICAL Left 05/09/2015   MASTECTOMY MODIFIED RADICAL Left 05/09/2015   Procedure: LEFT MODIFIED RADICAL MASTETCTOMY;  Surgeon: Chevis Pretty III, MD;  Location: [facility];  Service: General;  Laterality: Left;   MASTECTOMY W/ SENTINEL NODE BIOPSY Right    MASTECTOMY W/ SENTINEL NODE BIOPSY Right 05/09/2015   Procedure: RIGHT MASTECTOMY WITH RIGHT AXILLARY SENTINEL LYMPH NODE BIOPSY;  Surgeon: Chevis Pretty III, MD;  Location: [facility] OR;  Service: General;  Laterality: Right;   PORTACATH PLACEMENT Right 09/01/2015   Procedure: INSERTION PORT-A-CATH;  Surgeon: Chevis Pretty III, MD;  Location: [facility];  Service: General;  Laterality: Right;   TUBAL LIGATION  1984   Patient Active Problem List   Diagnosis Date Noted   Neck pain 03/31/2021   Lumbar radiculopathy 12/01/2019   Bilateral hip pain 12/01/2019   Urge incontinence 12/01/2019   Hyperlipidemia associated with type 2 diabetes mellitus (HCC) 05/19/2019    Diabetes mellitus (HCC) 07/15/2018   Routine general medical examination at a health care facility 07/15/2018   Adjustment disorder with mixed anxiety and depressed mood 07/17/2016   Morbid obesity (HCC) 11/28/2015   Chest wall pain 11/28/2015   Malignant neoplasm of upper-inner quadrant of right breast in female, estrogen receptor positive (HCC) 04/27/2015   Hypertension, essential 04/27/2015   Malignant neoplasm of upper-outer quadrant of left breast in female, estrogen receptor positive (HCC) 04/21/2015    PCP: Hillard Danker MD   REFERRING PROVIDER: Elinor Parkinson, [place]  REFERRING DIAG: [phone] (ICD-10-CM) - Posterior tibial tendon dysfunction (PTTD) of left lower extremity  THERAPY DIAG:  Tibialis posterior dysfunction  Pain in left ankle and joints of left foot  Difficulty in walking, not elsewhere classified  Rationale for Evaluation and Treatment: Rehabilitation  ONSET DATE: chronic   SUBJECTIVE:   SUBJECTIVE STATEMENT: Pt reports she is looking to obtain orthotics for her feet. Pt thinks her HEP is helping.  PERTINENT HISTORY:  Posterior tibial tendon dysfunction without tear per MRI. Breast cancer  Diabetes Back pain   PAIN:  Are you having pain? Yes: NPRS scale: 7/10 Pain location: L inner aspect of leg and foot  Pain description: cutting, stab Aggravating factors: standing, walking  Relieving factors: massaging it, does not do exercise, ice/biorfreeze   PRECAUTIONS: None  WEIGHT BEARING RESTRICTIONS: No  FALLS:  Has patient fallen in last 6 months? No  LIVING ENVIRONMENT: Lives with: lives with their family Lives in: House/apartment Stairs: Yes: External: 12 steps; on right going up Has following equipment at home: None  OCCUPATION: Part time   PLOF: Independent  PATIENT GOALS: Pt would like to be able to learn how to get the pain down and do it on my own.   OBJECTIVE:   DIAGNOSTIC FINDINGS: neg MRI   PATIENT SURVEYS:  FOTO  49%  COGNITION: Overall cognitive status: Within functional limits for tasks assessed     SENSATION: WFL  EDEMA:  Figure 8: Rt 58 cm, Lt 59 cm    POSTURE: rounded shoulders, forward head, and pes planus bilateral   PALPATION: Tender to palpation along medial lower leg and distally to medial malleolus and surrounding area   LOWER EXTREMITY ROM:  Active ROM Right eval Left eval  Hip flexion    Hip extension    Hip abduction    Hip adduction    Hip internal rotation    Hip external rotation    Knee flexion    Knee extension    Ankle dorsiflexion  8  Ankle plantarflexion  35  Ankle inversion 35 30  Ankle eversion 15 14   (Blank rows = not tested)  LOWER EXTREMITY MMT:  MMT Right eval Left eval  Hip flexion    Hip extension    Hip abduction    Hip adduction    Hip internal rotation    Hip external rotation    Knee flexion    Knee extension    Ankle dorsiflexion    Ankle plantarflexion    Ankle inversion    Ankle eversion     (Blank rows = not tested)  LOWER EXTREMITY SPECIAL TESTS:  NT  FUNCTIONAL TESTS:  5 times sit to stand: 15 sec  Unable to do any SLS in full WB on LLE, Rt LE about 3 sec   GAIT: Distance walked: 100 feet  Assistive device utilized: None Level of assistance: Modified independence Comments: slow pace, limp    TODAY'S TREATMENT:     [facility] Adult PT Treatment:                                                DATE: 02/21/23 Therapeutic Exercise: Seated towel scrunches for arch  Slant board stretch 2x 1' Lateral step ups to double airex 2x10 Red band inversion seated x 3x10 Standing balance: eyes closed, head turns and nods SL and Tandem on Airex  Heel raises 3 x 10 toe in, out and neutral  Manual Therapy: Light cross friction massage to the post tibialis tendon x8 mins Self Care: Instruction in cross friction massage to the post tibialis tendon with pt returning demonstration Use of cold pack x 10 mins for pain and swelling  management at home  [facility] Adult PT Treatment:                                                DATE: 02/19/23 Therapeutic Exercise: Seated towel scrunches for arch  Seated heel raise, toe raise Ankle circles Red band inversion seated x 2 x 15  Standing balance: eyes closed, head turns and  nods Tandem on Airex  Heel raises 3 x 10 toe in, out and neutral  Manual therapy PROM L ankle Soft tissue mobilization along arch and along post tib tendon                                                                                                                          DATE: 02/05/23  PT eval and establish HEP   PATIENT EDUCATION:  Education details: HEP, anatomy, orthotics needed  Person educated: Patient Education method: Explanation, Demonstration, and Handouts Education comprehension: verbalized understanding and needs further education  HOME EXERCISE PROGRAM: Access Code: [email or web address] URL: [email or web address] Date: 02/05/2023 Prepared by: Karie Mainland  Exercises - Towel Scrunches  - 2 x daily - 7 x weekly - 2 sets - 10 reps - 5-10 hold - Seated Figure 4 Ankle Inversion with Resistance  - 2 x daily - 7 x weekly - 2 sets - 10 reps - 5 hold - Tibialis Posterior Stretch at Wall  - 1 x daily - 7 x weekly - 1 sets - 5 reps - 30 hold - Seated Heel Raise  - 1 x daily - 7 x weekly - 2 sets - 10 reps - 5 hold  ASSESSMENT:  CLINICAL IMPRESSION:  PT was competed for manual therapy as noted above with recommendation for pt to complete cross friction massage at home. Also recommended pt to use a cold pack as needed for pain and swelling management. Therex was then completed for loading and strengthening of the L ankle and foot and increase flexibility of the ankle PFs. Discussed the option of TPDN to which the expressed interest in trying. Pt tolerated PT today without adverse effects. Pt will continue to benefit from skilled PT to address impairments for improved function of the L  ankle/foot with less pain.    OBJECTIVE IMPAIRMENTS: decreased activity tolerance, decreased balance, decreased mobility, difficulty walking, decreased ROM, decreased strength, increased edema, increased fascial restrictions, postural dysfunction, obesity, and pain.   ACTIVITY LIMITATIONS: carrying, standing, stairs, transfers, locomotion level, and caring for others  PARTICIPATION LIMITATIONS: shopping, community activity, and occupation  PERSONAL FACTORS: Time since onset of injury/illness/exacerbation and 3+ comorbidities: obesity, diabetes, back pain   are also affecting patient's functional outcome.   REHAB POTENTIAL: Good  CLINICAL DECISION MAKING: Stable/uncomplicated  EVALUATION COMPLEXITY: Low   GOALS: Goals reviewed with patient? Yes    LONG TERM GOALS: Target date: 03/20/2023   Pt will be able to show I with HEP for ankle, foot  Baseline: given on eval , cues  Goal status: ongoing   2.  Pt will able to walk and stand for work and pain no more than moderate (5/10-6/10) most of the time  Baseline:  Goal status: INITIAL  3.  Pt will be able to obtain orthotics and tolerate wearing them 2-3 hours a day  Baseline:  Goal status: INITIAL  4.  Pt will be able to have no more than min pain at  rest and on days off (<3/10) Baseline:  Goal status: INITIAL  5.  FOTO score will improve to 58% indicated improved functional mobility  Baseline: 49% Goal status: INITIAL    PLAN:  PT FREQUENCY: 2x/week  PT DURATION: 6 weeks  PLANNED INTERVENTIONS: Therapeutic exercises, Therapeutic activity, Neuromuscular re-education, Balance training, Gait training, Patient/Family education, Self Care, Joint mobilization, Cryotherapy, Moist heat, Taping, Manual therapy, and Re-evaluation  PLAN FOR NEXT SESSION: check HEP, Nustep, manual as tol. Standing balance and strength in plantarflex.   Joseline Mccampbell MS, PT 02/21/23 3:30 PM

## 2023-02-21 ENCOUNTER — Ambulatory Visit: Payer: 59

## 2023-02-21 DIAGNOSIS — M76822 Posterior tibial tendinitis, left leg: Secondary | ICD-10-CM | POA: Diagnosis not present

## 2023-02-21 DIAGNOSIS — M25572 Pain in left ankle and joints of left foot: Secondary | ICD-10-CM

## 2023-02-21 DIAGNOSIS — R262 Difficulty in walking, not elsewhere classified: Secondary | ICD-10-CM

## 2023-02-21 DIAGNOSIS — M76829 Posterior tibial tendinitis, unspecified leg: Secondary | ICD-10-CM

## 2023-02-25 ENCOUNTER — Other Ambulatory Visit (HOSPITAL_COMMUNITY): Payer: Self-pay

## 2023-02-25 NOTE — Therapy (Signed)
OUTPATIENT PHYSICAL THERAPY LOWER EXTREMITY TREATMENT NOTE   Patient Name: Margaret [name] MRN: [id number] DOB:[birth year], 62 y.o., female Today's Date: 02/26/2023  END OF SESSION:  PT End of Session - 02/26/23 1541     Visit Number 4    Number of Visits 12    Date for PT Re-Evaluation 04/02/23    Authorization Type [employer] MCD    PT Start Time 1155    PT Stop Time 1240    PT Time Calculation (min) 45 min    Activity Tolerance Patient tolerated treatment well    Behavior During Therapy WFL for tasks assessed/performed                Past Medical History:  Diagnosis Date   Allergy    Anxiety    Arthritis    Bilateral breast cancer (HCC)    Breast cancer (HCC)    Breast cancer of upper-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) 04/21/2015   Depression    Diabetes mellitus without complication (HCC)    GERD (gastroesophageal reflux disease)    Hypertension    Past Surgical History:  Procedure Laterality Date   ABDOMINAL HYSTERECTOMY  1990   MASTECTOMY MODIFIED RADICAL Left 05/09/2015   MASTECTOMY MODIFIED RADICAL Left 05/09/2015   Procedure: LEFT MODIFIED RADICAL MASTETCTOMY;  Surgeon: Chevis Pretty III, MD;  Location: [facility];  Service: General;  Laterality: Left;   MASTECTOMY W/ SENTINEL NODE BIOPSY Right    MASTECTOMY W/ SENTINEL NODE BIOPSY Right 05/09/2015   Procedure: RIGHT MASTECTOMY WITH RIGHT AXILLARY SENTINEL LYMPH NODE BIOPSY;  Surgeon: Chevis Pretty III, MD;  Location: [facility];  Service: General;  Laterality: Right;   PORTACATH PLACEMENT Right 09/01/2015   Procedure: INSERTION PORT-A-CATH;  Surgeon: Chevis Pretty III, MD;  Location: [facility];  Service: General;  Laterality: Right;   TUBAL LIGATION  1984   Patient Active Problem List   Diagnosis Date Noted   Neck pain 03/31/2021   Lumbar radiculopathy 12/01/2019   Bilateral hip pain 12/01/2019   Urge incontinence 12/01/2019   Hyperlipidemia associated with type 2 diabetes mellitus (HCC) 05/19/2019    Diabetes mellitus (HCC) 07/15/2018   Routine general medical examination at a health care facility 07/15/2018   Adjustment disorder with mixed anxiety and depressed mood 07/17/2016   Morbid obesity (HCC) 11/28/2015   Chest wall pain 11/28/2015   Malignant neoplasm of upper-inner quadrant of right breast in female, estrogen receptor positive (HCC) 04/27/2015   Hypertension, essential 04/27/2015   Malignant neoplasm of upper-outer quadrant of left breast in female, estrogen receptor positive (HCC) 04/21/2015    PCP: Hillard Danker MD   REFERRING PROVIDER: Elinor Parkinson, [place]  REFERRING DIAG: [phone] (ICD-10-CM) - Posterior tibial tendon dysfunction (PTTD) of left lower extremity  THERAPY DIAG:  Tibialis posterior dysfunction  Pain in left ankle and joints of left foot  Difficulty in walking, not elsewhere classified  Rationale for Evaluation and Treatment: Rehabilitation  ONSET DATE: chronic   SUBJECTIVE:   SUBJECTIVE STATEMENT: Pt reports her L foot was bad yesterday after working the weekend. It was a 10/10.  PERTINENT HISTORY:  Posterior tibial tendon dysfunction without tear per MRI. Breast cancer  Diabetes Back pain   PAIN:  Are you having pain? Yes: NPRS scale: 7/10 Pain location: L inner aspect of leg and foot  Pain description: cutting, stab Aggravating factors: standing, walking  Relieving factors: massaging it, does not do exercise, ice/biorfreeze   PRECAUTIONS: None  WEIGHT BEARING RESTRICTIONS: No  FALLS:  Has patient fallen in last 6 months? No  LIVING ENVIRONMENT: Lives with: lives with their family Lives in: House/apartment Stairs: Yes: External: 12 steps; on right going up Has following equipment at home: None  OCCUPATION: Part time   PLOF: Independent  PATIENT GOALS: Pt would like to be able to learn how to get the pain down and do it on my own.   OBJECTIVE:   DIAGNOSTIC FINDINGS: neg MRI   PATIENT SURVEYS:  FOTO  49%  COGNITION: Overall cognitive status: Within functional limits for tasks assessed     SENSATION: WFL  EDEMA:  Figure 8: Rt 58 cm, Lt 59 cm    POSTURE: rounded shoulders, forward head, and pes planus bilateral   PALPATION: Tender to palpation along medial lower leg and distally to medial malleolus and surrounding area   LOWER EXTREMITY ROM:  Active ROM Right eval Left eval  Hip flexion    Hip extension    Hip abduction    Hip adduction    Hip internal rotation    Hip external rotation    Knee flexion    Knee extension    Ankle dorsiflexion  8  Ankle plantarflexion  35  Ankle inversion 35 30  Ankle eversion 15 14   (Blank rows = not tested)  LOWER EXTREMITY MMT:  MMT Right eval Left eval  Hip flexion    Hip extension    Hip abduction    Hip adduction    Hip internal rotation    Hip external rotation    Knee flexion    Knee extension    Ankle dorsiflexion    Ankle plantarflexion    Ankle inversion    Ankle eversion     (Blank rows = not tested)  LOWER EXTREMITY SPECIAL TESTS:  NT  FUNCTIONAL TESTS:  5 times sit to stand: 15 sec  Unable to do any SLS in full WB on LLE, Rt LE about 3 sec   GAIT: Distance walked: 100 feet  Assistive device utilized: None Level of assistance: Modified independence Comments: slow pace, limp    TODAY'S TREATMENT:     [facility] Adult PT Treatment:                                                DATE: 02/26/23 Therapeutic Exercise: Nustep 5 mins L5 UE/LE Seated towel scrunches for arch  L ankle DF/foot pillow case stretch x2 30" Red band inversion and ev seated x 2x10 SL and Tandem on Airex  Heel raises 3 x 10 toe in, out and neutral, with rolled towel Manual Therapy: Rock tape for Post tibialis support, 1 I strip, 50%, anchored at lateral heel border and the other anchor to the medial mid calf. Due to skin lotion, a portion of the tape above the medial malleolus would not adhere. 3 I strips were then applied from the  lateral foot to the medial/sup arch with 80% stretch for arch support. Self Care: Use of tennis ball for heel and plantar foot massage  [facility] Adult PT Treatment:                                                DATE: 02/21/23 Therapeutic Exercise: Seated  towel scrunches for arch  Slant board stretch 2x 1' Lateral step ups to double airex 2x10 Red band inversion seated x 3x10 Standing balance: eyes closed, head turns and nods SL and Tandem on Airex  Heel raises 3 x 10 toe in, out and neutral  Manual Therapy: Light cross friction massage to the post tibialis tendon x8 mins Self Care: Instruction in cross friction massage to the post tibialis tendon with pt returning demonstration Use of cold pack x 10 mins for pain and swelling management at home  [facility] Adult PT Treatment:                                                DATE: 02/19/23 Therapeutic Exercise: Seated towel scrunches for arch  Seated heel raise, toe raise Ankle circles Red band inversion seated x 2 x 15  Standing balance: eyes closed, head turns and nods Tandem on Airex  Heel raises 3 x 10 toe in, out and neutral  Manual therapy PROM L ankle Soft tissue mobilization along arch and along post tib tendon                                                                                                                          DATE: 02/05/23  PT eval and establish HEP   PATIENT EDUCATION:  Education details: HEP, anatomy, orthotics needed  Person educated: Patient Education method: Explanation, Demonstration, and Handouts Education comprehension: verbalized understanding and needs further education  HOME EXERCISE PROGRAM: Access Code: [email or web address] URL: [email or web address] Date: 02/05/2023 Prepared by: Karie Mainland  Exercises - Towel Scrunches  - 2 x daily - 7 x weekly - 2 sets - 10 reps - 5-10 hold - Seated Figure 4 Ankle Inversion with Resistance  - 2 x daily - 7 x weekly - 2 sets - 10 reps - 5 hold -  Tibialis Posterior Stretch at Wall  - 1 x daily - 7 x weekly - 1 sets - 5 reps - 30 hold - Seated Heel Raise  - 1 x daily - 7 x weekly - 2 sets - 10 reps - 5 hold  ASSESSMENT:  CLINICAL IMPRESSION:  PT was competed for Rock taping to support the L post tibialis and longitudinal arch. Pt then completed therex ankle DF stretching and ankle strengthening. With the [facility] tape, the pt reported improved support for her L feet. Pt is concerned about the price of foot orthotics from 1 provided, so other option was provided to the pt. Will assess a more complete response to the taping the next PT session. Pt will continue to benefit from skilled PT to address impairments for improved L ankle/foot function with less pain.  OBJECTIVE IMPAIRMENTS: decreased activity tolerance, decreased balance, decreased mobility, difficulty walking, decreased ROM, decreased strength, increased edema, increased fascial restrictions, postural dysfunction, obesity, and pain.  ACTIVITY LIMITATIONS: carrying, standing, stairs, transfers, locomotion level, and caring for others  PARTICIPATION LIMITATIONS: shopping, community activity, and occupation  PERSONAL FACTORS: Time since onset of injury/illness/exacerbation and 3+ comorbidities: obesity, diabetes, back pain   are also affecting patient's functional outcome.   REHAB POTENTIAL: Good  CLINICAL DECISION MAKING: Stable/uncomplicated  EVALUATION COMPLEXITY: Low   GOALS: Goals reviewed with patient? Yes    LONG TERM GOALS: Target date: 03/20/2023   Pt will be able to show I with HEP for ankle, foot  Baseline: given on eval , cues  Goal status: ongoing   2.  Pt will able to walk and stand for work and pain no more than moderate (5/10-6/10) most of the time  Baseline:  Goal status: INITIAL  3.  Pt will be able to obtain orthotics and tolerate wearing them 2-3 hours a day  Baseline:  Goal status: INITIAL  4.  Pt will be able to have no more than min pain at  rest and on days off (<3/10) Baseline:  Goal status: INITIAL  5.  FOTO score will improve to 58% indicated improved functional mobility  Baseline: 49% Goal status: INITIAL    PLAN:  PT FREQUENCY: 2x/week  PT DURATION: 6 weeks  PLANNED INTERVENTIONS: Therapeutic exercises, Therapeutic activity, Neuromuscular re-education, Balance training, Gait training, Patient/Family education, Self Care, Joint mobilization, Cryotherapy, Moist heat, Taping, Manual therapy, and Re-evaluation  PLAN FOR NEXT SESSION: check HEP, Nustep, manual as tol. Standing balance and strength in plantarflex.   Marvens Hollars MS, PT 02/26/23 4:09 PM

## 2023-02-26 ENCOUNTER — Ambulatory Visit: Payer: 59

## 2023-02-26 DIAGNOSIS — R262 Difficulty in walking, not elsewhere classified: Secondary | ICD-10-CM | POA: Diagnosis not present

## 2023-02-26 DIAGNOSIS — M25572 Pain in left ankle and joints of left foot: Secondary | ICD-10-CM | POA: Diagnosis not present

## 2023-02-26 DIAGNOSIS — M76829 Posterior tibial tendinitis, unspecified leg: Secondary | ICD-10-CM

## 2023-02-26 DIAGNOSIS — M76822 Posterior tibial tendinitis, left leg: Secondary | ICD-10-CM | POA: Diagnosis not present

## 2023-02-28 ENCOUNTER — Ambulatory Visit: Payer: 59 | Admitting: Physical Therapy

## 2023-02-28 ENCOUNTER — Encounter: Payer: Self-pay | Admitting: Physical Therapy

## 2023-02-28 DIAGNOSIS — M76829 Posterior tibial tendinitis, unspecified leg: Secondary | ICD-10-CM

## 2023-02-28 DIAGNOSIS — M25572 Pain in left ankle and joints of left foot: Secondary | ICD-10-CM | POA: Diagnosis not present

## 2023-02-28 DIAGNOSIS — R262 Difficulty in walking, not elsewhere classified: Secondary | ICD-10-CM | POA: Diagnosis not present

## 2023-02-28 DIAGNOSIS — M76822 Posterior tibial tendinitis, left leg: Secondary | ICD-10-CM | POA: Diagnosis not present

## 2023-02-28 NOTE — Therapy (Signed)
OUTPATIENT PHYSICAL THERAPY LOWER EXTREMITY TREATMENT NOTE   Patient Name: Margaret [name] MRN: [id number] DOB:1961-02-12, 62 y.o., female Today's Date: 02/28/2023  END OF SESSION:  PT End of Session - 02/28/23 1449     Visit Number 5    Number of Visits 12    Date for PT Re-Evaluation 04/02/23    Authorization Type [employer] MCD    PT Start Time 0246    PT Stop Time 0325    PT Time Calculation (min) 39 min                Past Medical History:  Diagnosis Date   Allergy    Anxiety    Arthritis    Bilateral breast cancer (HCC)    Breast cancer (HCC)    Breast cancer of upper-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) 04/21/2015   Depression    Diabetes mellitus without complication (HCC)    GERD (gastroesophageal reflux disease)    Hypertension    Past Surgical History:  Procedure Laterality Date   ABDOMINAL HYSTERECTOMY  1990   MASTECTOMY MODIFIED RADICAL Left 05/09/2015   MASTECTOMY MODIFIED RADICAL Left 05/09/2015   Procedure: LEFT MODIFIED RADICAL MASTETCTOMY;  Surgeon: Chevis Pretty III, MD;  Location: [facility];  Service: General;  Laterality: Left;   MASTECTOMY W/ SENTINEL NODE BIOPSY Right    MASTECTOMY W/ SENTINEL NODE BIOPSY Right 05/09/2015   Procedure: RIGHT MASTECTOMY WITH RIGHT AXILLARY SENTINEL LYMPH NODE BIOPSY;  Surgeon: Chevis Pretty III, MD;  Location: [facility] OR;  Service: General;  Laterality: Right;   PORTACATH PLACEMENT Right 09/01/2015   Procedure: INSERTION PORT-A-CATH;  Surgeon: Chevis Pretty III, MD;  Location: [facility];  Service: General;  Laterality: Right;   TUBAL LIGATION  1984   Patient Active Problem List   Diagnosis Date Noted   Neck pain 03/31/2021   Lumbar radiculopathy 12/01/2019   Bilateral hip pain 12/01/2019   Urge incontinence 12/01/2019   Hyperlipidemia associated with type 2 diabetes mellitus (HCC) 05/19/2019   Diabetes mellitus (HCC) 07/15/2018   Routine general medical examination at a health care facility 07/15/2018    Adjustment disorder with mixed anxiety and depressed mood 07/17/2016   Morbid obesity (HCC) 11/28/2015   Chest wall pain 11/28/2015   Malignant neoplasm of upper-inner quadrant of right breast in female, estrogen receptor positive (HCC) 04/27/2015   Hypertension, essential 04/27/2015   Malignant neoplasm of upper-outer quadrant of left breast in female, estrogen receptor positive (HCC) 04/21/2015    PCP: Hillard Danker MD   REFERRING PROVIDER: Elinor Parkinson, [place]  REFERRING DIAG: [phone] (ICD-10-CM) - Posterior tibial tendon dysfunction (PTTD) of left lower extremity  THERAPY DIAG:  Tibialis posterior dysfunction  Difficulty in walking, not elsewhere classified  Pain in left ankle and joints of left foot  Rationale for Evaluation and Treatment: Rehabilitation  ONSET DATE: chronic   SUBJECTIVE:   SUBJECTIVE STATEMENT: Pt reports decreased pain with rock tape application. She is still wearing the tape today and plans to purchase.   PERTINENT HISTORY:  Posterior tibial tendon dysfunction without tear per MRI. Breast cancer  Diabetes Back pain   PAIN:  Are you having pain? Yes: NPRS scale: 7/10 Pain location: L inner aspect of leg and foot  Pain description: cutting, stab Aggravating factors: standing, walking  Relieving factors: massaging it, does not do exercise, ice/biorfreeze   PRECAUTIONS: None  WEIGHT BEARING RESTRICTIONS: No  FALLS:  Has patient fallen in last 6 months? No  LIVING ENVIRONMENT: Lives  with: lives with their family Lives in: House/apartment Stairs: Yes: External: 12 steps; on right going up Has following equipment at home: None  OCCUPATION: Part time   PLOF: Independent  PATIENT GOALS: Pt would like to be able to learn how to get the pain down and do it on my own.   OBJECTIVE:   DIAGNOSTIC FINDINGS: neg MRI   PATIENT SURVEYS:  FOTO 49%  COGNITION: Overall cognitive status: Within functional limits for tasks  assessed     SENSATION: WFL  EDEMA:  Figure 8: Rt 58 cm, Lt 59 cm    POSTURE: rounded shoulders, forward head, and pes planus bilateral   PALPATION: Tender to palpation along medial lower leg and distally to medial malleolus and surrounding area   LOWER EXTREMITY ROM:  Active ROM Right eval Left eval  Hip flexion    Hip extension    Hip abduction    Hip adduction    Hip internal rotation    Hip external rotation    Knee flexion    Knee extension    Ankle dorsiflexion  8  Ankle plantarflexion  35  Ankle inversion 35 30  Ankle eversion 15 14   (Blank rows = not tested)  LOWER EXTREMITY MMT:  MMT Right eval Left eval  Hip flexion    Hip extension    Hip abduction    Hip adduction    Hip internal rotation    Hip external rotation    Knee flexion    Knee extension    Ankle dorsiflexion    Ankle plantarflexion    Ankle inversion    Ankle eversion     (Blank rows = not tested)  LOWER EXTREMITY SPECIAL TESTS:  NT  FUNCTIONAL TESTS:  5 times sit to stand: 15 sec  Unable to do any SLS in full WB on LLE, Rt LE about 3 sec   GAIT: Distance walked: 100 feet  Assistive device utilized: None Level of assistance: Modified independence Comments: slow pace, limp    TODAY'S TREATMENT:     [facility] Adult PT Treatment:                                                DATE: 02/28/23 Therapeutic Exercise: Nustep L5 UE/LE x 5 min Runners stretch Soleus stretch  Rocker board blue A/P Slant board stretch  Tandem stance on AIREX- unable to perform LLE back without UE assist Tandem stance on floor 3-4- sec LLE back, 10 sec RLE back  Seated Baps L2 CW CCW  Seated red band inv, ev , pain Modalities:  Cryotherapy: vaso x 10 min 34 deg   [facility] Adult PT Treatment:                                                DATE: 02/26/23 Therapeutic Exercise: Nustep 5 mins L5 UE/LE Seated towel scrunches for arch  L ankle DF/foot pillow case stretch x2 30" Red band inversion and ev  seated x 2x10 SL and Tandem on Airex  Heel raises 3 x 10 toe in, out and neutral, with rolled towel Manual Therapy: Rock tape for Post tibialis support, 1 I strip, 50%, anchored at lateral heel border and the other anchor to  the medial mid calf. Due to skin lotion, a portion of the tape above the medial malleolus would not adhere. 3 I strips were then applied from the lateral foot to the medial/sup arch with 80% stretch for arch support. Self Care: Use of tennis ball for heel and plantar foot massage  [facility] Adult PT Treatment:                                                DATE: 02/21/23 Therapeutic Exercise: Seated towel scrunches for arch  Slant board stretch 2x 1' Lateral step ups to double airex 2x10 Red band inversion seated x 3x10 Standing balance: eyes closed, head turns and nods SL and Tandem on Airex  Heel raises 3 x 10 toe in, out and neutral  Manual Therapy: Light cross friction massage to the post tibialis tendon x8 mins Self Care: Instruction in cross friction massage to the post tibialis tendon with pt returning demonstration Use of cold pack x 10 mins for pain and swelling management at home  [facility] Adult PT Treatment:                                                DATE: 02/19/23 Therapeutic Exercise: Seated towel scrunches for arch  Seated heel raise, toe raise Ankle circles Red band inversion seated x 2 x 15  Standing balance: eyes closed, head turns and nods Tandem on Airex  Heel raises 3 x 10 toe in, out and neutral  Manual therapy PROM L ankle Soft tissue mobilization along arch and along post tib tendon                                                                                                                          DATE: 02/05/23  PT eval and establish HEP   PATIENT EDUCATION:  Education details: HEP, anatomy, orthotics needed  Person educated: Patient Education method: Explanation, Demonstration, and Handouts Education comprehension: verbalized  understanding and needs further education  HOME EXERCISE PROGRAM: Access Code: [email or web address] URL: [email or web address] Date: 02/05/2023 Prepared by: Karie Mainland  Exercises - Towel Scrunches  - 2 x daily - 7 x weekly - 2 sets - 10 reps - 5-10 hold - Seated Figure 4 Ankle Inversion with Resistance  - 2 x daily - 7 x weekly - 2 sets - 10 reps - 5 hold - Tibialis Posterior Stretch at Wall  - 1 x daily - 7 x weekly - 1 sets - 5 reps - 30 hold - Seated Heel Raise  - 1 x daily - 7 x weekly - 2 sets - 10 reps - 5 hold  ASSESSMENT:  CLINICAL IMPRESSION:  Pt reports rock tape was helpful last visit. She  plans to purchase the tape and apply while working. Continued therex for ankle DF stretching and ankle strengthening in open and closed chain. She reported increased irritation of ankle and end of session so cryotherapy was used to decrease her ankle/foot pain. She was encouraged not to over do her exercises at home. Good relief of symptoms after cryotherapy.  Pt will continue to benefit from skilled PT to address impairments for improved L ankle/foot function with less pain.  OBJECTIVE IMPAIRMENTS: decreased activity tolerance, decreased balance, decreased mobility, difficulty walking, decreased ROM, decreased strength, increased edema, increased fascial restrictions, postural dysfunction, obesity, and pain.   ACTIVITY LIMITATIONS: carrying, standing, stairs, transfers, locomotion level, and caring for others  PARTICIPATION LIMITATIONS: shopping, community activity, and occupation  PERSONAL FACTORS: Time since onset of injury/illness/exacerbation and 3+ comorbidities: obesity, diabetes, back pain   are also affecting patient's functional outcome.   REHAB POTENTIAL: Good  CLINICAL DECISION MAKING: Stable/uncomplicated  EVALUATION COMPLEXITY: Low   GOALS: Goals reviewed with patient? Yes    LONG TERM GOALS: Target date: 03/20/2023   Pt will be able to show I with HEP for  ankle, foot  Baseline: given on eval , cues  Goal status: ongoing   2.  Pt will able to walk and stand for work and pain no more than moderate (5/10-6/10) most of the time  Baseline:  Goal status: INITIAL  3.  Pt will be able to obtain orthotics and tolerate wearing them 2-3 hours a day  Baseline:  Goal status: INITIAL  4.  Pt will be able to have no more than min pain at rest and on days off (<3/10) Baseline:  Goal status: INITIAL  5.  FOTO score will improve to 58% indicated improved functional mobility  Baseline: 49% Goal status: INITIAL    PLAN:  PT FREQUENCY: 2x/week  PT DURATION: 6 weeks  PLANNED INTERVENTIONS: Therapeutic exercises, Therapeutic activity, Neuromuscular re-education, Balance training, Gait training, Patient/Family education, Self Care, Joint mobilization, Cryotherapy, Moist heat, Taping, Manual therapy, and Re-evaluation  PLAN FOR NEXT SESSION: check HEP, Nustep, manual as tol. Standing balance and strength in plantarflex. Did she like game ready? (Not in POC so used for cryotherapy)   Jannette Spanner, PTA 02/28/23 3:31 PM Phone: [phone] Fax: [phone]

## 2023-03-04 ENCOUNTER — Other Ambulatory Visit (HOSPITAL_COMMUNITY): Payer: Self-pay

## 2023-03-04 ENCOUNTER — Other Ambulatory Visit: Payer: Self-pay | Admitting: Internal Medicine

## 2023-03-04 ENCOUNTER — Other Ambulatory Visit: Payer: Self-pay | Admitting: Hematology and Oncology

## 2023-03-04 DIAGNOSIS — Z170 Estrogen receptor positive status [ER+]: Secondary | ICD-10-CM

## 2023-03-04 DIAGNOSIS — G629 Polyneuropathy, unspecified: Secondary | ICD-10-CM

## 2023-03-04 MED ORDER — GABAPENTIN 300 MG PO CAPS
600.0000 mg | ORAL_CAPSULE | Freq: Three times a day (TID) | ORAL | 2 refills | Status: DC
Start: 2023-03-04 — End: 2023-06-12
  Filled 2023-03-04: qty 180, 30d supply, fill #0
  Filled 2023-04-05: qty 180, 30d supply, fill #1
  Filled 2023-05-16: qty 180, 30d supply, fill #2

## 2023-03-04 NOTE — Therapy (Signed)
OUTPATIENT PHYSICAL THERAPY LOWER EXTREMITY TREATMENT NOTE   Patient Name: Margaret [name] MRN: [id number] DOB:Apr 17, 1961, 62 y.o., female Today's Date: 03/05/2023  END OF SESSION:  PT End of Session - 03/05/23 1148     Visit Number 6    Number of Visits 12    Date for PT Re-Evaluation 04/02/23    Authorization Type [employer] MCD    PT Start Time 1148    PT Stop Time 1230    PT Time Calculation (min) 42 min    Activity Tolerance Patient tolerated treatment well    Behavior During Therapy WFL for tasks assessed/performed                 Past Medical History:  Diagnosis Date   Allergy    Anxiety    Arthritis    Bilateral breast cancer (HCC)    Breast cancer (HCC)    Breast cancer of upper-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) 04/21/2015   Depression    Diabetes mellitus without complication (HCC)    GERD (gastroesophageal reflux disease)    Hypertension    Past Surgical History:  Procedure Laterality Date   ABDOMINAL HYSTERECTOMY  1990   MASTECTOMY MODIFIED RADICAL Left 05/09/2015   MASTECTOMY MODIFIED RADICAL Left 05/09/2015   Procedure: LEFT MODIFIED RADICAL MASTETCTOMY;  Surgeon: Chevis Pretty III, MD;  Location: [facility];  Service: General;  Laterality: Left;   MASTECTOMY W/ SENTINEL NODE BIOPSY Right    MASTECTOMY W/ SENTINEL NODE BIOPSY Right 05/09/2015   Procedure: RIGHT MASTECTOMY WITH RIGHT AXILLARY SENTINEL LYMPH NODE BIOPSY;  Surgeon: Chevis Pretty III, MD;  Location: [facility];  Service: General;  Laterality: Right;   PORTACATH PLACEMENT Right 09/01/2015   Procedure: INSERTION PORT-A-CATH;  Surgeon: Chevis Pretty III, MD;  Location: [facility];  Service: General;  Laterality: Right;   TUBAL LIGATION  1984   Patient Active Problem List   Diagnosis Date Noted   Neck pain 03/31/2021   Lumbar radiculopathy 12/01/2019   Bilateral hip pain 12/01/2019   Urge incontinence 12/01/2019   Hyperlipidemia associated with type 2 diabetes mellitus (HCC) 05/19/2019    Diabetes mellitus (HCC) 07/15/2018   Routine general medical examination at a health care facility 07/15/2018   Adjustment disorder with mixed anxiety and depressed mood 07/17/2016   Morbid obesity (HCC) 11/28/2015   Chest wall pain 11/28/2015   Malignant neoplasm of upper-inner quadrant of right breast in female, estrogen receptor positive (HCC) 04/27/2015   Hypertension, essential 04/27/2015   Malignant neoplasm of upper-outer quadrant of left breast in female, estrogen receptor positive (HCC) 04/21/2015    PCP: Hillard Danker MD   REFERRING PROVIDER: Elinor Parkinson, [place]  REFERRING DIAG: [phone] (ICD-10-CM) - Posterior tibial tendon dysfunction (PTTD) of left lower extremity  THERAPY DIAG:  Tibialis posterior dysfunction  Difficulty in walking, not elsewhere classified  Pain in left ankle and joints of left foot  Rationale for Evaluation and Treatment: Rehabilitation  ONSET DATE: chronic   SUBJECTIVE:   SUBJECTIVE STATEMENT: Pt reports taking ibuprofen this AM due to the pain. It is now a 5/10. Pain continues to be worse after working on her feet all day. Pt has a fitting for orthotics today.  PERTINENT HISTORY:  Posterior tibial tendon dysfunction without tear per MRI. Breast cancer  Diabetes Back pain   PAIN:  Are you having pain? Yes: NPRS scale: 7/10 Pain location: L inner aspect of leg and foot  Pain description: cutting, stab Aggravating factors: standing, walking  Relieving factors: massaging it, does not do exercise, ice/biorfreeze   PRECAUTIONS: None  WEIGHT BEARING RESTRICTIONS: No  FALLS:  Has patient fallen in last 6 months? No  LIVING ENVIRONMENT: Lives with: lives with their family Lives in: House/apartment Stairs: Yes: External: 12 steps; on right going up Has following equipment at home: None  OCCUPATION: Part time   PLOF: Independent  PATIENT GOALS: Pt would like to be able to learn how to get the pain down and do it on my own.    OBJECTIVE:   DIAGNOSTIC FINDINGS: neg MRI   PATIENT SURVEYS:  FOTO 49%  COGNITION: Overall cognitive status: Within functional limits for tasks assessed     SENSATION: WFL  EDEMA:  Figure 8: Rt 58 cm, Lt 59 cm    POSTURE: rounded shoulders, forward head, and pes planus bilateral   PALPATION: Tender to palpation along medial lower leg and distally to medial malleolus and surrounding area   LOWER EXTREMITY ROM:  Active ROM Right eval Left eval  Hip flexion    Hip extension    Hip abduction    Hip adduction    Hip internal rotation    Hip external rotation    Knee flexion    Knee extension    Ankle dorsiflexion  8  Ankle plantarflexion  35  Ankle inversion 35 30  Ankle eversion 15 14   (Blank rows = not tested)  LOWER EXTREMITY MMT:  MMT Right eval Left eval  Hip flexion    Hip extension    Hip abduction    Hip adduction    Hip internal rotation    Hip external rotation    Knee flexion    Knee extension    Ankle dorsiflexion    Ankle plantarflexion    Ankle inversion    Ankle eversion     (Blank rows = not tested)  LOWER EXTREMITY SPECIAL TESTS:  NT  FUNCTIONAL TESTS:  5 times sit to stand: 15 sec  Unable to do any SLS in full WB on LLE, Rt LE about 3 sec   GAIT: Distance walked: 100 feet  Assistive device utilized: None Level of assistance: Modified independence Comments: slow pace, limp    TODAY'S TREATMENT:   [facility] Adult PT Treatment:                                                DATE: 02/3023 Therapeutic Exercise: Nustep L5 UE/LE x 5 min Seated towel scrunches for arch Slant board stretch Marching on Airex 2x20 Rocker board blue A/P and lateral Manual Therapy: Rock tape for Post tibialis support, 1 I strip, 50%, anchored at lateral heel border and the other anchor to the medial mid calf. Due to skin lotion, a portion of the tape above the medial malleolus would not adhere. 3 I strips were then applied from the lateral foot to the  medial/sup arch with 80% stretch for arch support. Modalities: Cryotherapy: vaso x 10 min 34 deg    [facility] Adult PT Treatment:                                                DATE: 02/28/23 Therapeutic Exercise: Nustep L5 UE/LE x 5 min Runners stretch  Soleus stretch  Rocker board blue A/P Slant board stretch  Tandem stance on AIREX- unable to perform LLE back without UE assist Tandem stance on floor 3-4- sec LLE back, 10 sec RLE back  Seated BapW  Seated red band inv, s L2 CW CCev , pain Modalities:  Cryotherapy: vaso x 10 min 34 deg   [facility] Adult PT Treatment:                                                DATE: 02/26/23 Therapeutic Exercise: Nustep 5 mins L5 UE/LE Seated towel scrunches for arch  L ankle DF/foot pillow case stretch x2 30" Red band inversion and ev seated x 2x10 SL and Tandem on Airex  Heel raises 3 x 10 toe in, out and neutral, with rolled towel Manual Therapy: Rock tape for Post tibialis support, 1 I strip, 50%, anchored at lateral heel border and the other anchor to the medial mid calf. Due to skin lotion, a portion of the tape above the medial malleolus would not adhere. 3 I strips were then applied from the lateral foot to the medial/sup arch with 80% stretch for arch support. Self Care: Use of tennis ball for heel and plantar foot massage  [facility] Adult PT Treatment:                                                DATE: 02/21/23 Therapeutic Exercise: Seated towel scrunches for arch  Slant board stretch 2x 1' Lateral step ups to double airex 2x10 Red band inversion seated x 3x10 Standing balance: eyes closed, head turns and nods SL and Tandem on Airex  Heel raises 3 x 10 toe in, out and neutral  Manual Therapy: Light cross friction massage to the post tibialis tendon x8 mins Self Care: Instruction in cross friction massage to the post tibialis tendon with pt returning demonstration Use of cold pack x 10 mins for pain and swelling management at home  [facility] Lenisha Lacap  Adult PT Treatment:                                                DATE: 02/19/23 Therapeutic Exercise: Seated towel scrunches for arch  Seated heel raise, toe raise Ankle circles Red band inversion seated x 2 x 15  Standing balance: eyes closed, head turns and nods Tandem on Airex  Heel raises 3 x 10 toe in, out and neutral  Manual therapy PROM L ankle Soft tissue mobilization along arch and along post tib tendon  DATE: 02/05/23  PT eval and establish HEP   PATIENT EDUCATION:  Education details: HEP, anatomy, orthotics needed  Person educated: Patient Education method: Explanation, Demonstration, and Handouts Education comprehension: verbalized understanding and needs further education  HOME EXERCISE PROGRAM: Access Code: [email or web address] URL: [email or web address] Date: 02/05/2023 Prepared by: Karie Mainland  Exercises - Towel Scrunches  - 2 x daily - 7 x weekly - 2 sets - 10 reps - 5-10 hold - Seated Figure 4 Ankle Inversion with Resistance  - 2 x daily - 7 x weekly - 2 sets - 10 reps - 5 hold - Tibialis Posterior Stretch at Wall  - 1 x daily - 7 x weekly - 1 sets - 5 reps - 30 hold - Seated Heel Raise  - 1 x daily - 7 x weekly - 2 sets - 10 reps - 5 hold  ASSESSMENT:  CLINICAL IMPRESSION:  Applied Rock tape again for L arch and post tibialis support. Therex was completed for L ankle foot flexibility and strengthening. Strengthening was completed primarily in the [facility]. Vaso was completed at the end of session for symptom management. Pt will continue to benefit from skilled PT to address impairments for improved L ankle/foot function with less pain. Will check on when the pt expects to receive her orthotics the next Pt session.  OBJECTIVE IMPAIRMENTS: decreased activity tolerance, decreased balance, decreased mobility, difficulty walking, decreased ROM,  decreased strength, increased edema, increased fascial restrictions, postural dysfunction, obesity, and pain.   ACTIVITY LIMITATIONS: carrying, standing, stairs, transfers, locomotion level, and caring for others  PARTICIPATION LIMITATIONS: shopping, community activity, and occupation  PERSONAL FACTORS: Time since onset of injury/illness/exacerbation and 3+ comorbidities: obesity, diabetes, back pain   are also affecting patient's functional outcome.   REHAB POTENTIAL: Good  CLINICAL DECISION MAKING: Stable/uncomplicated  EVALUATION COMPLEXITY: Low   GOALS: Goals reviewed with patient? Yes    LONG TERM GOALS: Target date: 03/20/2023   Pt will be able to show I with HEP for ankle, foot  Baseline: given on eval , cues  Goal status: ongoing   2.  Pt will able to walk and stand for work and pain no more than moderate (5/10-6/10) most of the time  Baseline:  Goal status: INITIAL  3.  Pt will be able to obtain orthotics and tolerate wearing them 2-3 hours a day  Baseline:  Goal status: INITIAL  4.  Pt will be able to have no more than min pain at rest and on days off (<3/10) Baseline:  Goal status: INITIAL  5.  FOTO score will improve to 58% indicated improved functional mobility  Baseline: 49% Goal status: INITIAL    PLAN:  PT FREQUENCY: 2x/week  PT DURATION: 6 weeks  PLANNED INTERVENTIONS: Therapeutic exercises, Therapeutic activity, Neuromuscular re-education, Balance training, Gait training, Patient/Family education, Self Care, Joint mobilization, Cryotherapy, Moist heat, Taping, Manual therapy, and Re-evaluation  PLAN FOR NEXT SESSION: check HEP, Nustep, manual as tol. Standing balance and strength in plantarflex. Did she like game ready? (Not in POC so used for cryotherapy)    Joellyn Rued MS, PT 03/05/23 12:42 PM

## 2023-03-05 ENCOUNTER — Ambulatory Visit: Payer: 59

## 2023-03-05 ENCOUNTER — Other Ambulatory Visit: Payer: Self-pay

## 2023-03-05 ENCOUNTER — Other Ambulatory Visit (HOSPITAL_COMMUNITY): Payer: Self-pay

## 2023-03-05 DIAGNOSIS — M25572 Pain in left ankle and joints of left foot: Secondary | ICD-10-CM

## 2023-03-05 DIAGNOSIS — R262 Difficulty in walking, not elsewhere classified: Secondary | ICD-10-CM

## 2023-03-05 DIAGNOSIS — M76822 Posterior tibial tendinitis, left leg: Secondary | ICD-10-CM | POA: Diagnosis not present

## 2023-03-05 DIAGNOSIS — M76829 Posterior tibial tendinitis, unspecified leg: Secondary | ICD-10-CM

## 2023-03-05 MED ORDER — BACLOFEN 20 MG PO TABS
20.0000 mg | ORAL_TABLET | Freq: Three times a day (TID) | ORAL | 5 refills | Status: DC
  Filled 2023-03-05: qty 90, 30d supply, fill #0
  Filled 2023-04-05: qty 90, 30d supply, fill #1
  Filled 2023-05-16: qty 90, 30d supply, fill #2
  Filled 2023-06-12: qty 90, 30d supply, fill #3
  Filled 2023-08-02: qty 90, 30d supply, fill #4
  Filled 2023-09-10: qty 90, 30d supply, fill #5

## 2023-03-07 ENCOUNTER — Encounter: Payer: Self-pay | Admitting: Physical Therapy

## 2023-03-07 ENCOUNTER — Ambulatory Visit: Payer: 59 | Attending: Podiatry | Admitting: Physical Therapy

## 2023-03-07 DIAGNOSIS — R262 Difficulty in walking, not elsewhere classified: Secondary | ICD-10-CM | POA: Diagnosis not present

## 2023-03-07 DIAGNOSIS — M25572 Pain in left ankle and joints of left foot: Secondary | ICD-10-CM | POA: Insufficient documentation

## 2023-03-07 DIAGNOSIS — M76829 Posterior tibial tendinitis, unspecified leg: Secondary | ICD-10-CM | POA: Insufficient documentation

## 2023-03-07 NOTE — Therapy (Signed)
OUTPATIENT PHYSICAL THERAPY LOWER EXTREMITY TREATMENT NOTE   Patient Name: Margaret [name] MRN: [id number] DOB:03-01-1961, 62 y.o., female Today's Date: 03/07/2023  END OF SESSION:  PT End of Session - 03/07/23 1233     Visit Number 7    Number of Visits 12    Date for PT Re-Evaluation 04/02/23    Authorization Type [employer] MCD    PT Start Time 1230    PT Stop Time 1315    PT Time Calculation (min) 45 min    Activity Tolerance Patient tolerated treatment well    Behavior During Therapy WFL for tasks assessed/performed                  Past Medical History:  Diagnosis Date   Allergy    Anxiety    Arthritis    Bilateral breast cancer (HCC)    Breast cancer (HCC)    Breast cancer of upper-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) 04/21/2015   Depression    Diabetes mellitus without complication (HCC)    GERD (gastroesophageal reflux disease)    Hypertension    Past Surgical History:  Procedure Laterality Date   ABDOMINAL HYSTERECTOMY  1990   MASTECTOMY MODIFIED RADICAL Left 05/09/2015   MASTECTOMY MODIFIED RADICAL Left 05/09/2015   Procedure: LEFT MODIFIED RADICAL MASTETCTOMY;  Surgeon: Chevis Pretty III, MD;  Location: [facility] OR;  Service: General;  Laterality: Left;   MASTECTOMY W/ SENTINEL NODE BIOPSY Right    MASTECTOMY W/ SENTINEL NODE BIOPSY Right 05/09/2015   Procedure: RIGHT MASTECTOMY WITH RIGHT AXILLARY SENTINEL LYMPH NODE BIOPSY;  Surgeon: Chevis Pretty III, MD;  Location: [facility] OR;  Service: General;  Laterality: Right;   PORTACATH PLACEMENT Right 09/01/2015   Procedure: INSERTION PORT-A-CATH;  Surgeon: Chevis Pretty III, MD;  Location: [facility];  Service: General;  Laterality: Right;   TUBAL LIGATION  1984   Patient Active Problem List   Diagnosis Date Noted   Neck pain 03/31/2021   Lumbar radiculopathy 12/01/2019   Bilateral hip pain 12/01/2019   Urge incontinence 12/01/2019   Hyperlipidemia associated with type 2 diabetes mellitus (HCC) 05/19/2019    Diabetes mellitus (HCC) 07/15/2018   Routine general medical examination at a health care facility 07/15/2018   Adjustment disorder with mixed anxiety and depressed mood 07/17/2016   Morbid obesity (HCC) 11/28/2015   Chest wall pain 11/28/2015   Malignant neoplasm of upper-inner quadrant of right breast in female, estrogen receptor positive (HCC) 04/27/2015   Hypertension, essential 04/27/2015   Malignant neoplasm of upper-outer quadrant of left breast in female, estrogen receptor positive (HCC) 04/21/2015    PCP: Hillard Danker MD   REFERRING PROVIDER: Elinor Parkinson, [place]  REFERRING DIAG: [phone] (ICD-10-CM) - Posterior tibial tendon dysfunction (PTTD) of left lower extremity  THERAPY DIAG:  Tibialis posterior dysfunction  Difficulty in walking, not elsewhere classified  Pain in left ankle and joints of left foot  Rationale for Evaluation and Treatment: Rehabilitation  ONSET DATE: chronic   SUBJECTIVE:   SUBJECTIVE STATEMENT: My foot feels fine right now I just came from a pedicure.  I have been doing everything.  I get my new shoes and orthotics done today.   PERTINENT HISTORY:  Posterior tibial tendon dysfunction without tear per MRI. Breast cancer  Diabetes Back pain   PAIN:  Are you having pain? Yes: NPRS scale: 3/10 Pain location: L inner aspect of leg and foot  Pain description: cutting, stab Aggravating factors: standing, walking  Relieving factors: massaging  it, does not do exercise, ice/biorfreeze   PRECAUTIONS: None  WEIGHT BEARING RESTRICTIONS: No  FALLS:  Has patient fallen in last 6 months? No  LIVING ENVIRONMENT: Lives with: lives with their family Lives in: House/apartment Stairs: Yes: External: 12 steps; on right going up Has following equipment at home: None  OCCUPATION: Part time   PLOF: Independent  PATIENT GOALS: Pt would like to be able to learn how to get the pain down and do it on my own.   OBJECTIVE:   DIAGNOSTIC FINDINGS:  neg MRI   PATIENT SURVEYS:  FOTO 49%  COGNITION: Overall cognitive status: Within functional limits for tasks assessed     SENSATION: WFL  EDEMA:  Figure 8: Rt 58 cm, Lt 59 cm    POSTURE: rounded shoulders, forward head, and pes planus bilateral   PALPATION: Tender to palpation along medial lower leg and distally to medial malleolus and surrounding area   LOWER EXTREMITY ROM:  Active ROM Right eval Left eval  Hip flexion    Hip extension    Hip abduction    Hip adduction    Hip internal rotation    Hip external rotation    Knee flexion    Knee extension    Ankle dorsiflexion  8  Ankle plantarflexion  35  Ankle inversion 35 30  Ankle eversion 15 14   (Blank rows = not tested)  LOWER EXTREMITY MMT:  MMT Right eval Left eval  Hip flexion    Hip extension    Hip abduction    Hip adduction    Hip internal rotation    Hip external rotation    Knee flexion    Knee extension    Ankle dorsiflexion    Ankle plantarflexion    Ankle inversion    Ankle eversion     (Blank rows = not tested)  LOWER EXTREMITY SPECIAL TESTS:  NT  FUNCTIONAL TESTS:  5 times sit to stand: 15 sec  Unable to do any SLS in full WB on LLE, Rt LE about 3 sec   GAIT: Distance walked: 100 feet  Assistive device utilized: None Level of assistance: Modified independence Comments: slow pace, limp    TODAY'S TREATMENT:    [facility] Adult PT Treatment:                                                DATE: 03/07/23 Therapeutic Exercise: NuStep 7 min LE and UE  Slantboard 1 min  Heel raise x 10 x 3 (heel in, out and parallel)  Rockerboard  forward and back 1 min, static hold with occ UE assist  Single leg ankle DF/PF  Balance pad hip abduction  x 15 , hip extension x 15 each side  Seated hamstring stretch 30 sec x 3 Manual Therapy: Rock tape for Post tibialis support, 1 I-strip, 50%, anchored at lateral heel border and the other anchor to the medial mid calf. 2 x I-strips were then applied  from the lateral foot to the medial/sup arch with 80% stretch for arch support.  [facility] Adult PT Treatment:                                                DATE:  02/3023 Therapeutic Exercise: Nustep L5 UE/LE x 5 min Seated towel scrunches for arch Slant board stretch Marching on Airex 2x20 Rocker board blue A/P and lateral Manual Therapy: Rock tape for Post tibialis support, 1 I strip, 50%, anchored at lateral heel border and the other anchor to the medial mid calf. Due to skin lotion, a portion of the tape above the medial malleolus would not adhere. 3 I strips were then applied from the lateral foot to the medial/sup arch with 80% stretch for arch support. Modalities: Cryotherapy: vaso x 10 min 34 deg    [facility] Adult PT Treatment:                                                DATE: 02/28/23 Therapeutic Exercise: Nustep L5 UE/LE x 5 min Runners stretch Soleus stretch  Rocker board blue A/P Slant board stretch  Tandem stance on AIREX- unable to perform LLE back without UE assist Tandem stance on floor 3-4- sec LLE back, 10 sec RLE back  Seated BapW  Seated red band inv, s L2 CW CCev , pain Modalities:  Cryotherapy: vaso x 10 min 34 deg   [facility] Adult PT Treatment:                                                DATE: 02/26/23 Therapeutic Exercise: Nustep 5 mins L5 UE/LE Seated towel scrunches for arch  L ankle DF/foot pillow case stretch x2 30" Red band inversion and ev seated x 2x10 SL and Tandem on Airex  Heel raises 3 x 10 toe in, out and neutral, with rolled towel Manual Therapy: Rock tape for Post tibialis support, 1 I strip, 50%, anchored at lateral heel border and the other anchor to the medial mid calf. Due to skin lotion, a portion of the tape above the medial malleolus would not adhere. 3 I strips were then applied from the lateral foot to the medial/sup arch with 80% stretch for arch support. Self Care: Use of tennis ball for heel and plantar foot massage  [facility] Adult PT  Treatment:                                                DATE: 02/21/23 Therapeutic Exercise: Seated towel scrunches for arch  Slant board stretch 2x 1' Lateral step ups to double airex 2x10 Red band inversion seated x 3x10 Standing balance: eyes closed, head turns and nods SL and Tandem on Airex  Heel raises 3 x 10 toe in, out and neutral  Manual Therapy: Light cross friction massage to the post tibialis tendon x8 mins Self Care: Instruction in cross friction massage to the post tibialis tendon with pt returning demonstration Use of cold pack x 10 mins for pain and swelling management at home  [facility] Adult PT Treatment:                                                DATE:  02/19/23 Therapeutic Exercise: Seated towel scrunches for arch  Seated heel raise, toe raise Ankle circles Red band inversion seated x 2 x 15  Standing balance: eyes closed, head turns and nods Tandem on Airex  Heel raises 3 x 10 toe in, out and neutral  Manual therapy PROM L ankle Soft tissue mobilization along arch and along post tib tendon                                                                                                                          DATE: 02/05/23  PT eval and establish HEP   PATIENT EDUCATION:  Education details: HEP, anatomy, orthotics needed  Person educated: Patient Education method: Explanation, Demonstration, and Handouts Education comprehension: verbalized understanding and needs further education  HOME EXERCISE PROGRAM: Access Code: [email or web address] URL: [email or web address] Date: 02/05/2023 Prepared by: Karie Mainland  Exercises - Towel Scrunches  - 2 x daily - 7 x weekly - 2 sets - 10 reps - 5-10 hold - Seated Figure 4 Ankle Inversion with Resistance  - 2 x daily - 7 x weekly - 2 sets - 10 reps - 5 hold - Tibialis Posterior Stretch at Wall  - 1 x daily - 7 x weekly - 1 sets - 5 reps - 30 hold - Seated Heel Raise  - 1 x daily - 7 x weekly - 2 sets - 10 reps - 5  hold  ASSESSMENT:  CLINICAL IMPRESSION:  Patient worked in standing for majority of the session.  Repeated tape today as it gave her a good amount of relief last few times.  She is diligent with her HEP and will be fitted today for her orthotic.  She scheduled more visits today until the end of the month.   OBJECTIVE IMPAIRMENTS: decreased activity tolerance, decreased balance, decreased mobility, difficulty walking, decreased ROM, decreased strength, increased edema, increased fascial restrictions, postural dysfunction, obesity, and pain.   ACTIVITY LIMITATIONS: carrying, standing, stairs, transfers, locomotion level, and caring for others  PARTICIPATION LIMITATIONS: shopping, community activity, and occupation  PERSONAL FACTORS: Time since onset of injury/illness/exacerbation and 3+ comorbidities: obesity, diabetes, back pain   are also affecting patient's functional outcome.   REHAB POTENTIAL: Good  CLINICAL DECISION MAKING: Stable/uncomplicated  EVALUATION COMPLEXITY: Low   GOALS: Goals reviewed with patient? Yes    LONG TERM GOALS: Target date: 03/20/2023   Pt will be able to show I with HEP for ankle, foot  Baseline: given on eval , cues  Goal status: ongoing   2.  Pt will able to walk and stand for work and pain no more than moderate (5/10-6/10) most of the time  Baseline:  Goal status: INITIAL  3.  Pt will be able to obtain orthotics and tolerate wearing them 2-3 hours a day  Baseline:  Goal status: INITIAL  4.  Pt will be able to have no more than min pain at rest and on days off (<3/10) Baseline:  Goal status: INITIAL  5.  FOTO score will improve to 58% indicated improved functional mobility  Baseline: 49% Goal status: INITIAL    PLAN:  PT FREQUENCY: 2x/week  PT DURATION: 6 weeks  PLANNED INTERVENTIONS: Therapeutic exercises, Therapeutic activity, Neuromuscular re-education, Balance training, Gait training, Patient/Family education, Self Care, Joint  mobilization, Cryotherapy, Moist heat, Taping, Manual therapy, and Re-evaluation  PLAN FOR NEXT SESSION: FOTO.  check HEP, Nustep, manual as tol. Standing balance and strength in plantarflex. Did she like game ready? (Not in POC so used for cryotherapy)   Karie Mainland, PT 03/07/23 1:26 PM Phone: [phone] Fax: [phone]

## 2023-03-15 ENCOUNTER — Other Ambulatory Visit: Payer: Self-pay | Admitting: Hematology and Oncology

## 2023-03-15 ENCOUNTER — Other Ambulatory Visit: Payer: Self-pay | Admitting: Internal Medicine

## 2023-03-15 ENCOUNTER — Other Ambulatory Visit (HOSPITAL_COMMUNITY): Payer: Self-pay

## 2023-03-15 ENCOUNTER — Other Ambulatory Visit: Payer: Self-pay

## 2023-03-15 DIAGNOSIS — G4720 Circadian rhythm sleep disorder, unspecified type: Secondary | ICD-10-CM

## 2023-03-15 MED ORDER — TRAZODONE HCL 50 MG PO TABS
100.0000 mg | ORAL_TABLET | Freq: Every evening | ORAL | 0 refills | Status: DC
  Filled 2023-03-15: qty 60, 30d supply, fill #0

## 2023-03-15 MED ORDER — OZEMPIC (0.25 OR 0.5 MG/DOSE) 2 MG/3ML ~~LOC~~ SOPN
PEN_INJECTOR | SUBCUTANEOUS | 0 refills | Status: AC
  Filled 2023-03-15: qty 6, 56d supply, fill #0

## 2023-03-15 MED ORDER — GLIMEPIRIDE 4 MG PO TABS
4.0000 mg | ORAL_TABLET | Freq: Every day | ORAL | 2 refills | Status: DC
  Filled 2023-03-15: qty 90, 90d supply, fill #0
  Filled 2023-06-12: qty 90, 90d supply, fill #1
  Filled 2023-09-10: qty 90, 90d supply, fill #2

## 2023-03-16 ENCOUNTER — Other Ambulatory Visit (HOSPITAL_COMMUNITY): Payer: Self-pay

## 2023-03-18 ENCOUNTER — Other Ambulatory Visit (HOSPITAL_BASED_OUTPATIENT_CLINIC_OR_DEPARTMENT_OTHER): Payer: Self-pay

## 2023-03-18 ENCOUNTER — Other Ambulatory Visit: Payer: Self-pay | Admitting: *Deleted

## 2023-03-18 DIAGNOSIS — Z170 Estrogen receptor positive status [ER+]: Secondary | ICD-10-CM

## 2023-03-19 ENCOUNTER — Ambulatory Visit: Payer: 59 | Admitting: Physical Therapy

## 2023-03-21 ENCOUNTER — Ambulatory Visit: Payer: Medicare Other | Admitting: Hematology and Oncology

## 2023-03-21 ENCOUNTER — Other Ambulatory Visit: Payer: Medicare Other

## 2023-03-21 ENCOUNTER — Ambulatory Visit: Payer: 59

## 2023-03-25 NOTE — Therapy (Addendum)
OUTPATIENT PHYSICAL THERAPY LOWER EXTREMITY TREATMENT NOTE   Patient Name: Margaret [name] MRN: [id number] DOB:[birth year], 62 y.o., female Today's Date: 03/26/2023   PHYSICAL THERAPY DISCHARGE SUMMARY  Visits from Start of Care: 8  Current functional level related to goals / functional outcomes: Unknown see below    Remaining deficits: See below   Education / Equipment: HEP, orthotics, see below   Patient agrees to discharge. Patient goals were partially met. Patient is being discharged due to not returning since the last visit.    Karie Mainland, PT 05/27/23 11:02 AM Phone: [phone] Fax: [phone]  END OF SESSION:  PT End of Session - 03/26/23 1250     Visit Number 8    Number of Visits 12    Date for PT Re-Evaluation 04/02/23    Authorization Type [employer] MCD    PT Start Time 1157    PT Stop Time 1236    PT Time Calculation (min) 39 min    Activity Tolerance Patient tolerated treatment well    Behavior During Therapy WFL for tasks assessed/performed                   Past Medical History:  Diagnosis Date   Allergy    Anxiety    Arthritis    Bilateral breast cancer (HCC)    Breast cancer (HCC)    Breast cancer of upper-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) 04/21/2015   Depression    Diabetes mellitus without complication (HCC)    GERD (gastroesophageal reflux disease)    Hypertension    Past Surgical History:  Procedure Laterality Date   ABDOMINAL HYSTERECTOMY  1990   MASTECTOMY MODIFIED RADICAL Left 05/09/2015   MASTECTOMY MODIFIED RADICAL Left 05/09/2015   Procedure: LEFT MODIFIED RADICAL MASTETCTOMY;  Surgeon: Chevis Pretty III, MD;  Location: [facility] OR;  Service: General;  Laterality: Left;   MASTECTOMY W/ SENTINEL NODE BIOPSY Right    MASTECTOMY W/ SENTINEL NODE BIOPSY Right 05/09/2015   Procedure: RIGHT MASTECTOMY WITH RIGHT AXILLARY SENTINEL LYMPH NODE BIOPSY;  Surgeon: Chevis Pretty III, MD;  Location: [facility] OR;  Service: General;  Laterality:  Right;   PORTACATH PLACEMENT Right 09/01/2015   Procedure: INSERTION PORT-A-CATH;  Surgeon: Chevis Pretty III, MD;  Location: [facility];  Service: General;  Laterality: Right;   TUBAL LIGATION  1984   Patient Active Problem List   Diagnosis Date Noted   Rash 03/26/2023   Neck pain 03/31/2021   Lumbar radiculopathy 12/01/2019   Bilateral hip pain 12/01/2019   Urge incontinence 12/01/2019   Hyperlipidemia associated with type 2 diabetes mellitus (HCC) 05/19/2019   Diabetes mellitus (HCC) 07/15/2018   Routine general medical examination at a health care facility 07/15/2018   Adjustment disorder with mixed anxiety and depressed mood 07/17/2016   Morbid obesity (HCC) 11/28/2015   Chest wall pain 11/28/2015   Malignant neoplasm of upper-inner quadrant of right breast in female, estrogen receptor positive (HCC) 04/27/2015   Hypertension, essential 04/27/2015   Malignant neoplasm of upper-outer quadrant of left breast in female, estrogen receptor positive (HCC) 04/21/2015    PCP: Hillard Danker MD   REFERRING PROVIDER: Elinor Parkinson, [place]  REFERRING DIAG: [phone] (ICD-10-CM) - Posterior tibial tendon dysfunction (PTTD) of left lower extremity  THERAPY DIAG:  Tibialis posterior dysfunction  Difficulty in walking, not elsewhere classified  Pain in left ankle and joints of left foot  Rationale for Evaluation and Treatment: Rehabilitation  ONSET DATE: chronic   SUBJECTIVE:  SUBJECTIVE STATEMENT: Pt reports her L inner leg/ankle pain is better, 80%; and the new shoes and orthotics are helping. She does note soreness on the top of her L foot with the orthotic.   PERTINENT HISTORY:  Posterior tibial tendon dysfunction without tear per MRI. Breast cancer  Diabetes Back pain   PAIN:  Are you having pain? Yes: NPRS scale: 2/10 Pain location: L inner aspect of leg and foot  Pain description: cutting, stab Aggravating factors: standing, walking  Relieving factors:  massaging it, does not do exercise, ice/biorfreeze   PRECAUTIONS: None  WEIGHT BEARING RESTRICTIONS: No  FALLS:  Has patient fallen in last 6 months? No  LIVING ENVIRONMENT: Lives with: lives with their family Lives in: House/apartment Stairs: Yes: External: 12 steps; on right going up Has following equipment at home: None  OCCUPATION: Part time   PLOF: Independent  PATIENT GOALS: Pt would like to be able to learn how to get the pain down and do it on my own.   OBJECTIVE:   DIAGNOSTIC FINDINGS: neg MRI   PATIENT SURVEYS:  FOTO 49%  COGNITION: Overall cognitive status: Within functional limits for tasks assessed     SENSATION: WFL  EDEMA:  Figure 8: Rt 58 cm, Lt 59 cm    POSTURE: rounded shoulders, forward head, and pes planus bilateral   PALPATION: Tender to palpation along medial lower leg and distally to medial malleolus and surrounding area   LOWER EXTREMITY ROM:  Active ROM Right eval Left eval  Hip flexion    Hip extension    Hip abduction    Hip adduction    Hip internal rotation    Hip external rotation    Knee flexion    Knee extension    Ankle dorsiflexion  8  Ankle plantarflexion  35  Ankle inversion 35 30  Ankle eversion 15 14   (Blank rows = not tested)  LOWER EXTREMITY MMT:  MMT Right eval Left eval  Hip flexion    Hip extension    Hip abduction    Hip adduction    Hip internal rotation    Hip external rotation    Knee flexion    Knee extension    Ankle dorsiflexion    Ankle plantarflexion    Ankle inversion    Ankle eversion     (Blank rows = not tested)  LOWER EXTREMITY SPECIAL TESTS:  NT  FUNCTIONAL TESTS:  5 times sit to stand: 15 sec  Unable to do any SLS in full WB on LLE, Rt LE about 3 sec   GAIT: Distance walked: 100 feet  Assistive device utilized: None Level of assistance: Modified independence Comments: slow pace, limp    TODAY'S TREATMENT:   [facility] Adult PT Treatment:                                                 DATE: 03/26/23 Therapeutic Exercise: NuStep 7 min LE and UE  Slantboard 1 min  Heel raise x 10 x 3 (heel in, out and parallel)  SLS stable surface Balance pad marching x20, hip abduction  x15 , hip extension x15 each side  Seated hamstring stretch 30 sec x 3 Manual Therapy: Rock tape for Post tibialis support, 1 I-strip, 50%, anchored at lateral heel border and the other anchor to the medial mid calf. 2 x I-strips  were then applied from the lateral foot to the medial/sup arch with 80% stretch for arch support.  [facility] Adult PT Treatment:                                                DATE: 03/07/23 Therapeutic Exercise: NuStep 7 min LE and UE  Slantboard 1 min  Heel raise x 10 x 3 (heel in, out and parallel)  Rockerboard  forward and back 1 min, static hold with occ UE assist  Single leg ankle DF/PF  Balance pad hip abduction  x 15 , hip extension x 15 each side  Seated hamstring stretch 30 sec x 3 Manual Therapy: Rock tape for Post tibialis support, 1 I-strip, 50%, anchored at lateral heel border and the other anchor to the medial mid calf. 2 x I-strips were then applied from the lateral foot to the medial/sup arch with 80% stretch for arch support.  [facility] Adult PT Treatment:                                                DATE: 02/3023 Therapeutic Exercise: Nustep L5 UE/LE x 5 min Seated towel scrunches for arch Slant board stretch Marching on Airex 2x20 Rocker board blue A/P and lateral Manual Therapy: Rock tape for Post tibialis support, 1 I strip, 50%, anchored at lateral heel border and the other anchor to the medial mid calf. Due to skin lotion, a portion of the tape above the medial malleolus would not adhere. 3 I strips were then applied from the lateral foot to the medial/sup arch with 80% stretch for arch support. Modalities: Cryotherapy: vaso x 10 min 34 deg    [facility] Adult PT Treatment:                                                DATE: 02/28/23 Therapeutic  Exercise: Nustep L5 UE/LE x 5 min Runners stretch Soleus stretch  Rocker board blue A/P Slant board stretch  Tandem stance on AIREX- unable to perform LLE back without UE assist Tandem stance on floor 3-4- sec LLE back, 10 sec RLE back  Seated BapW  Seated red band inv, s L2 CW CCev , pain Modalities:  Cryotherapy: vaso x 10 min 34 deg   [facility] Adult PT Treatment:                                                DATE: 02/26/23 Therapeutic Exercise: Nustep 5 mins L5 UE/LE Seated towel scrunches for arch  L ankle DF/foot pillow case stretch x2 30" Red band inversion and ev seated x 2x10 SL and Tandem on Airex  Heel raises 3 x 10 toe in, out and neutral, with rolled towel Manual Therapy: Rock tape for Post tibialis support, 1 I strip, 50%, anchored at lateral heel border and the other anchor to the medial mid calf. Due to skin lotion, a portion of the tape above the medial malleolus would  not adhere. 3 I strips were then applied from the lateral foot to the medial/sup arch with 80% stretch for arch support. Self Care: Use of tennis ball for heel and plantar foot massage   PATIENT EDUCATION:  Education details: HEP, anatomy, orthotics needed  Person educated: Patient Education method: Explanation, Demonstration, and Handouts Education comprehension: verbalized understanding and needs further education  HOME EXERCISE PROGRAM: Access Code: [email or web address] URL: [email or web address] Date: 02/05/2023 Prepared by: Karie Mainland  Exercises - Towel Scrunches  - 2 x daily - 7 x weekly - 2 sets - 10 reps - 5-10 hold - Seated Figure 4 Ankle Inversion with Resistance  - 2 x daily - 7 x weekly - 2 sets - 10 reps - 5 hold - Tibialis Posterior Stretch at Wall  - 1 x daily - 7 x weekly - 1 sets - 5 reps - 30 hold - Seated Heel Raise  - 1 x daily - 7 x weekly - 2 sets - 10 reps - 5 hold  ASSESSMENT:  CLINICAL IMPRESSION:  Pt has found good pain relief with taping and with her new shoes  and orthotics. PT was continued for Rock taping and strengthening and balance therex. Pt tolerated PT today without adverse effects. Pt will continue to benefit from skilled PT to address impairments for improved L ankle/foot function with less pain. Re-assess FOTO the next Pt session.  OBJECTIVE IMPAIRMENTS: decreased activity tolerance, decreased balance, decreased mobility, difficulty walking, decreased ROM, decreased strength, increased edema, increased fascial restrictions, postural dysfunction, obesity, and pain.   ACTIVITY LIMITATIONS: carrying, standing, stairs, transfers, locomotion level, and caring for others  PARTICIPATION LIMITATIONS: shopping, community activity, and occupation  PERSONAL FACTORS: Time since onset of injury/illness/exacerbation and 3+ comorbidities: obesity, diabetes, back pain   are also affecting patient's functional outcome.   REHAB POTENTIAL: Good  CLINICAL DECISION MAKING: Stable/uncomplicated  EVALUATION COMPLEXITY: Low   GOALS: Goals reviewed with patient? Yes    LONG TERM GOALS: Target date: 03/20/2023   Pt will be able to show I with HEP for ankle, foot  Baseline: given on eval , cues  Goal status: ongoing   2.  Pt will able to walk and stand for work and pain no more than moderate (5/10-6/10) most of the time  Baseline:  Goal status : ongoing, unknown -did report 80% better   3.  Pt will be able to obtain orthotics and tolerate wearing them 2-3 hours a day  Baseline:  Goal status:met   4.  Pt will be able to have no more than min pain at rest and on days off (<3/10) Baseline:  03/26/23: 80% improvement Goal status: Improved-ongoing  5.  FOTO score will improve to 58% indicated improved functional mobility  Baseline: 49% Goal status: unknown     PLAN:  PT FREQUENCY: 2x/week  PT DURATION: 6 weeks  PLANNED INTERVENTIONS: Therapeutic exercises, Therapeutic activity, Neuromuscular re-education, Balance training, Gait training,  Patient/Family education, Self Care, Joint mobilization, Cryotherapy, Moist heat, Taping, Manual therapy, and Re-evaluation  PLAN FOR NEXT SESSION: FOTO.  check HEP, Nustep, manual as tol. Standing balance and strength in plantarflex. Did she like game ready? (Not in POC so used for cryotherapy)   Joellyn Rued MS, PT 03/26/23 1:10 PM

## 2023-03-26 ENCOUNTER — Encounter: Payer: Self-pay | Admitting: Internal Medicine

## 2023-03-26 ENCOUNTER — Ambulatory Visit (INDEPENDENT_AMBULATORY_CARE_PROVIDER_SITE_OTHER): Payer: 59 | Admitting: Internal Medicine

## 2023-03-26 ENCOUNTER — Other Ambulatory Visit: Payer: Self-pay

## 2023-03-26 ENCOUNTER — Ambulatory Visit: Payer: 59

## 2023-03-26 ENCOUNTER — Other Ambulatory Visit (HOSPITAL_COMMUNITY): Payer: Self-pay

## 2023-03-26 VITALS — BP 160/98 | HR 89 | Temp 97.9°F | Ht 68.5 in | Wt 252.0 lb

## 2023-03-26 DIAGNOSIS — R0789 Other chest pain: Secondary | ICD-10-CM | POA: Diagnosis not present

## 2023-03-26 DIAGNOSIS — R262 Difficulty in walking, not elsewhere classified: Secondary | ICD-10-CM

## 2023-03-26 DIAGNOSIS — C50412 Malignant neoplasm of upper-outer quadrant of left female breast: Secondary | ICD-10-CM

## 2023-03-26 DIAGNOSIS — M5416 Radiculopathy, lumbar region: Secondary | ICD-10-CM | POA: Diagnosis not present

## 2023-03-26 DIAGNOSIS — E1169 Type 2 diabetes mellitus with other specified complication: Secondary | ICD-10-CM

## 2023-03-26 DIAGNOSIS — M25572 Pain in left ankle and joints of left foot: Secondary | ICD-10-CM

## 2023-03-26 DIAGNOSIS — R21 Rash and other nonspecific skin eruption: Secondary | ICD-10-CM | POA: Diagnosis not present

## 2023-03-26 DIAGNOSIS — M76829 Posterior tibial tendinitis, unspecified leg: Secondary | ICD-10-CM

## 2023-03-26 DIAGNOSIS — F4323 Adjustment disorder with mixed anxiety and depressed mood: Secondary | ICD-10-CM

## 2023-03-26 DIAGNOSIS — G4720 Circadian rhythm sleep disorder, unspecified type: Secondary | ICD-10-CM | POA: Diagnosis not present

## 2023-03-26 DIAGNOSIS — C50211 Malignant neoplasm of upper-inner quadrant of right female breast: Secondary | ICD-10-CM | POA: Diagnosis not present

## 2023-03-26 DIAGNOSIS — Z7985 Long-term (current) use of injectable non-insulin antidiabetic drugs: Secondary | ICD-10-CM | POA: Diagnosis not present

## 2023-03-26 LAB — POCT GLYCOSYLATED HEMOGLOBIN (HGB A1C): HbA1c POC (<> result, manual entry): 7 % (ref 4.0–5.6)

## 2023-03-26 MED ORDER — HYDROCODONE-ACETAMINOPHEN 5-325 MG PO TABS
1.0000 | ORAL_TABLET | Freq: Four times a day (QID) | ORAL | 0 refills | Status: DC | PRN
  Filled 2023-03-26: qty 30, 8d supply, fill #0

## 2023-03-26 MED ORDER — VENLAFAXINE HCL ER 150 MG PO CP24
150.0000 mg | ORAL_CAPSULE | Freq: Every day | ORAL | 0 refills | Status: DC
Start: 2023-03-26 — End: 2023-09-10
  Filled 2023-03-26 – 2023-05-16 (×2): qty 90, 90d supply, fill #0

## 2023-03-26 MED ORDER — CLOBETASOL PROPIONATE 0.05 % EX CREA
1.0000 | TOPICAL_CREAM | Freq: Two times a day (BID) | CUTANEOUS | 3 refills | Status: AC
  Filled 2023-03-26: qty 30, 30d supply, fill #0
  Filled 2023-05-16: qty 30, 30d supply, fill #1
  Filled 2023-06-12: qty 30, 30d supply, fill #2
  Filled 2023-08-02: qty 30, 30d supply, fill #3

## 2023-03-26 MED ORDER — TRAZODONE HCL 100 MG PO TABS
100.0000 mg | ORAL_TABLET | Freq: Every day | ORAL | 3 refills | Status: DC
Start: 2023-03-26 — End: 2024-02-05
  Filled 2023-03-26: qty 90, 90d supply, fill #0
  Filled 2023-06-21: qty 90, 90d supply, fill #1
  Filled 2023-09-19: qty 90, 90d supply, fill #2
  Filled 2023-12-26: qty 90, 90d supply, fill #3
  Filled ????-??-??: fill #2

## 2023-03-26 MED ORDER — ATORVASTATIN CALCIUM 20 MG PO TABS
20.0000 mg | ORAL_TABLET | Freq: Every day | ORAL | 0 refills | Status: DC
  Filled 2023-03-26: qty 90, 90d supply, fill #0

## 2023-03-26 NOTE — Assessment & Plan Note (Signed)
Thickened plaque behind right ear and left elbow. Rx clobetasol 0.05%to help.

## 2023-03-26 NOTE — Assessment & Plan Note (Signed)
Has started ozempic and is unsure about dosing currently. She is doing 7 pounds and HgA1c down to 7.0 today done at visit POC which is great. Will keep amaryl 4 mg daily and ozempic per standard titration to get to 2 mg weekly. On statin and ARB.

## 2023-03-26 NOTE — Assessment & Plan Note (Addendum)
Suspect this is worsening and rx hydrocodone/apap 5/325 short supply. 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 Weight loss will help and she is titrating ozempic down 7 pounds.

## 2023-03-26 NOTE — Assessment & Plan Note (Signed)
Taking effexor 150 mg daily and offered dose increase she declines. Using buspar as needed rarely. We will take over effexor and trazodone prescriptions.

## 2023-03-26 NOTE — Patient Instructions (Addendum)
We have sent in the trazodone 100 mg to use at night time.   We have sent in strong cream to use on the ear and arm.  We have sent in hydrocodone to use for pain to help you get moving.  The HgA1c is 7.0

## 2023-03-26 NOTE — Assessment & Plan Note (Signed)
Has had chronic chest wall pain since surgery and taking tramadol 50 mg daily which we will continue.

## 2023-03-26 NOTE — Progress Notes (Signed)
   Subjective:   Patient ID: Margaret [name], female    DOB: Nov 25, 1960, 62 y.o.   MRN: [id number]  HPI The patient is a 62 YO female coming in for acute worsening pain in hips/legs and left arm. Chest is chronic and overall stable and her daily tramadol helps some with that. She is working with PT on her feet and this is helping with that. Rash behind ear as well.  Review of Systems  Constitutional:  Positive for activity change.  HENT: Negative.    Eyes: Negative.   Respiratory:  Negative for cough, chest tightness and shortness of breath.   Cardiovascular:  Negative for chest pain, palpitations and leg swelling.  Gastrointestinal:  Negative for abdominal distention, abdominal pain, constipation, diarrhea, nausea and vomiting.  Musculoskeletal:  Positive for arthralgias, back pain and myalgias.  Skin: Negative.   Neurological:  Positive for numbness.  Psychiatric/Behavioral: Negative.      Objective:  Physical Exam Constitutional:      Appearance: She is well-developed. She is obese.  HENT:     Head: Normocephalic and atraumatic.  Cardiovascular:     Rate and Rhythm: Normal rate and regular rhythm.  Pulmonary:     Effort: Pulmonary effort is normal. No respiratory distress.     Breath sounds: Normal breath sounds. No wheezing or rales.  Abdominal:     General: Bowel sounds are normal. There is no distension.     Palpations: Abdomen is soft.     Tenderness: There is no abdominal tenderness. There is no rebound.  Musculoskeletal:        General: Tenderness present.     Cervical back: Normal range of motion.  Skin:    General: Skin is warm and dry.     Findings: Rash present.  Neurological:     Mental Status: She is alert and oriented to person, place, and time.     Sensory: Sensory deficit present.     Coordination: Coordination normal.     Vitals:   03/26/23 1103 03/26/23 1105  BP: (!) 160/98 (!) 160/98  Pulse: 89   Temp: 97.9 F (36.6 C)   TempSrc: Oral   SpO2:  94%   Weight: 252 lb (114.3 kg)   Height: 5' 8.5" (1.74 m)     Assessment & Plan:

## 2023-03-28 ENCOUNTER — Ambulatory Visit: Payer: 59

## 2023-03-29 ENCOUNTER — Telehealth: Payer: Self-pay | Admitting: *Deleted

## 2023-03-29 NOTE — Telephone Encounter (Signed)
BWEL and PALLAS Studies:  Patient confirmed her appointments for Monday 04/01/23 starting at 1 pm for Lab, then Dr. Al Pimple. Informed her that the lab appointment is to collect blood specimen for the 7 year time point on PALLAS study and she agreed.   Domenica Reamer, BSN, RN, [employer] Research Nurse II 03/29/2023 11:19 AM

## 2023-04-01 ENCOUNTER — Inpatient Hospital Stay: Payer: 59 | Admitting: Hematology and Oncology

## 2023-04-01 ENCOUNTER — Inpatient Hospital Stay: Payer: 59

## 2023-04-01 ENCOUNTER — Telehealth: Payer: Self-pay | Admitting: *Deleted

## 2023-04-01 NOTE — Telephone Encounter (Signed)
BWEL/PALLAS Studies:  Patient did not arrive for her 1 pm lab appointment.  Called patient and she apologized stating she works night shift and overslept. She requests to reschedule to a Tuesday or Thursday around mid day if possible.  Notified Dr. Al Pimple and collaborative RN, Val.  Rescheduling request message sent to scheduler.  Domenica Reamer, BSN, RN, [employer] Research Nurse II [phone] 04/01/2023 2:29 PM

## 2023-04-02 ENCOUNTER — Telehealth: Payer: Self-pay | Admitting: *Deleted

## 2023-04-02 ENCOUNTER — Ambulatory Visit: Payer: 59

## 2023-04-02 NOTE — Telephone Encounter (Signed)
Informed patient of study visit for lab and to see Dr. Al Pimple has been rescheduled to 05/09/23 starting at 11:15 am.  She confirmed and was asked to call research if she needs to make any changes.  Domenica Reamer, BSN, RN, [employer] Research Nurse II [phone] 04/02/2023 9:48 AM

## 2023-04-03 ENCOUNTER — Other Ambulatory Visit (HOSPITAL_COMMUNITY): Payer: Self-pay

## 2023-04-03 ENCOUNTER — Other Ambulatory Visit: Payer: Self-pay | Admitting: Hematology and Oncology

## 2023-04-03 ENCOUNTER — Other Ambulatory Visit: Payer: Self-pay

## 2023-04-03 MED ORDER — POTASSIUM CHLORIDE CRYS ER 20 MEQ PO TBCR
20.0000 meq | EXTENDED_RELEASE_TABLET | Freq: Two times a day (BID) | ORAL | 0 refills | Status: DC
  Filled 2023-04-03: qty 90, 45d supply, fill #0

## 2023-04-04 ENCOUNTER — Telehealth: Payer: Self-pay

## 2023-04-04 NOTE — Telephone Encounter (Signed)
LVM re: multiple no show and cancellation appts. Pt was advised due to the frequency of no show and cancellation appts, she was being Dced from PT. She was advised she could return to PT with a new referral from her MD.

## 2023-04-05 ENCOUNTER — Other Ambulatory Visit (HOSPITAL_COMMUNITY): Payer: Self-pay

## 2023-04-09 DIAGNOSIS — M5416 Radiculopathy, lumbar region: Secondary | ICD-10-CM | POA: Diagnosis not present

## 2023-05-09 ENCOUNTER — Inpatient Hospital Stay: Payer: 59 | Attending: Hematology and Oncology

## 2023-05-09 ENCOUNTER — Encounter: Payer: Self-pay | Admitting: *Deleted

## 2023-05-09 ENCOUNTER — Inpatient Hospital Stay: Payer: 59 | Admitting: Hematology and Oncology

## 2023-05-09 VITALS — BP 142/80 | HR 90 | Temp 97.9°F | Resp 18 | Wt 236.9 lb

## 2023-05-09 DIAGNOSIS — Z87891 Personal history of nicotine dependence: Secondary | ICD-10-CM | POA: Insufficient documentation

## 2023-05-09 DIAGNOSIS — Z923 Personal history of irradiation: Secondary | ICD-10-CM | POA: Diagnosis not present

## 2023-05-09 DIAGNOSIS — C50412 Malignant neoplasm of upper-outer quadrant of left female breast: Secondary | ICD-10-CM | POA: Diagnosis not present

## 2023-05-09 DIAGNOSIS — G8928 Other chronic postprocedural pain: Secondary | ICD-10-CM | POA: Insufficient documentation

## 2023-05-09 DIAGNOSIS — Z170 Estrogen receptor positive status [ER+]: Secondary | ICD-10-CM

## 2023-05-09 DIAGNOSIS — Z79899 Other long term (current) drug therapy: Secondary | ICD-10-CM | POA: Insufficient documentation

## 2023-05-09 DIAGNOSIS — E1140 Type 2 diabetes mellitus with diabetic neuropathy, unspecified: Secondary | ICD-10-CM | POA: Insufficient documentation

## 2023-05-09 DIAGNOSIS — Z9013 Acquired absence of bilateral breasts and nipples: Secondary | ICD-10-CM | POA: Insufficient documentation

## 2023-05-09 DIAGNOSIS — Z006 Encounter for examination for normal comparison and control in clinical research program: Secondary | ICD-10-CM

## 2023-05-09 DIAGNOSIS — Z853 Personal history of malignant neoplasm of breast: Secondary | ICD-10-CM | POA: Diagnosis not present

## 2023-05-09 DIAGNOSIS — Z9221 Personal history of antineoplastic chemotherapy: Secondary | ICD-10-CM | POA: Diagnosis not present

## 2023-05-09 DIAGNOSIS — I890 Lymphedema, not elsewhere classified: Secondary | ICD-10-CM | POA: Insufficient documentation

## 2023-05-09 LAB — RESEARCH LABS

## 2023-05-09 NOTE — Progress Notes (Signed)
[facility]  [phone] [phone]   ID: Margaret [name] DOB: 08-Jun-1961  MR#: [id number]  JYN#:[id number]  Patient Care Team: Margaret Broker, MD as PCP - General (Internal Medicine) Margaret Miner, MD as Consulting Physician (General Surgery) Margaret Blackbird, MD as Consulting Physician (Radiation Oncology) Margaret Proud, RN as Registered Nurse Margaret Angelica, RN as Registered Nurse Margaret Rude, MD (Inactive) as Consulting Physician (Family Medicine) Margaret [name], [facility] (Inactive) as Pharmacist (Pharmacist) Rachel Moulds, MD as Consulting Physician (Hematology and Oncology) OTHER MD:  CHIEF COMPLAINT:  estrogen receptor positive breast cancer; (s/p bilateral mastectomies)  CURRENT TREATMENT: observation  INTERVAL HISTORY:  Margaret [name] returns today for follow-up of her estrogen receptor positive breast cancer.  She is here for an annual visit, she participated in the [place] trial and received 2 years of adjuvant palbociclib with 5 years of anastrozole. She is status post bilateral mastectomies, so does not require annual mammography.   She reports that her mother recently passed away due to complications from leukemia and dialysis. Despite this, she is managing her grief well and does not feel the need for additional support at this time.  She reports some pain in the area of a previous surgery, which she attributes to more work being done on one side than the other. She also reports high blood sugar levels, which were being managed with Ozempic, but she missed three weeks of this medication due to being out of town. She is unsure whether she can restart this medication.  She also reports new symptoms of neuropathy in her hands, which she has previously only experienced in her feet. This is causing her some concern, as is a patch of skin on her arm which feels like glass. She has been prescribed a cream for this, but is unsure whether  it is working.  She reports no changes in her breathing, bowel movements, or urination. She has some ankle swelling, but no other new symptoms.  Rest of the pertinent 10 point ROS reviewed and negative  COVID 19 VACCINATION STATUS: Pfizer x3, most recently 07/2020   BREAST CANCER HISTORY: From the original intake note:  Margaret [name]'s primary care physician retired sometime ago and her health maintenance has not been up-to-date. She has been receiving medical care through the emergency room and was seen in March 2015 following a head injury, then in August 2015 for lancing of an abscess. In December 2015 she noticed that her left breast looked a little bit smaller than her right. She brought this to the attention of friends and family over the next several months but the general feeling was that most people are not perfectly symmetrical. By the summer of this year she noticed that her nipple on the left was "going in". More recently she started developing pain in the left breast. She was evaluated for this in the emergency room 04/09/2015 at which time a left breast exam showed a deformed left breast with a large hard mass encompassing most of the breast, with nipple retraction. There was no nipple discharge or bleeding and no palpable adenopathy.  The patient was referred to [facility] and on 04/13/2015 she underwent bilateral diagnostic mammography with tomosynthesis as well as bilateral breast ultrasound. The breast density was category B. In the right breast at the 11:00 position there was a 1.3 cm irregular mass which by ultrasonography measured 1.3 cm.--In the left breast there was a 4 cm irregular mass at the 2:00 position associated  with nipple retraction. There was a second, 1.7 cm area of architectural distortion at the 9:00 position. Both were palpable. By ultrasonography, the 4 cm irregular mass was noted, with a second mass measuring 2.5 cm by ultrasonography. Both axillae were benign.  On 04/19/2015  the patient underwent right breast upper outer quadrant biopsy, showing ([id number]) and invasive ductal carcinoma, grade 1, estrogen receptor 90% positive, progesterone receptor negative, with an MIB-1 of 5%, and no HER-2 amplification, the signals ratio being 1.26 and the number per cell 2.20.  On the same day, she underwent biopsy of the 2 left breast masses in question as well as a suspicious left axillary lymph node. All 3 biopsies showed invasive ductal carcinoma, grade 2, estrogen receptor 80-100% positive, progesterone receptor 80-100% positive, with the MIB-1 between 5 and 10%, and no HER-2 amplification, the signals ratio being between 1.05 and 1.13, and the number per cell between 2.10 and 2.25.  The patient's subsequent history is as detailed below   PAST MEDICAL HISTORY: Past Medical History:  Diagnosis Date   Allergy    Anxiety    Arthritis    Bilateral breast cancer (HCC)    Breast cancer (HCC)    Breast cancer of upper-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) 04/21/2015   Depression    Diabetes mellitus without complication (HCC)    GERD (gastroesophageal reflux disease)    Hypertension     PAST SURGICAL HISTORY: Past Surgical History:  Procedure Laterality Date   ABDOMINAL HYSTERECTOMY  1990   MASTECTOMY MODIFIED RADICAL Left 05/09/2015   MASTECTOMY MODIFIED RADICAL Left 05/09/2015   Procedure: LEFT MODIFIED RADICAL MASTETCTOMY;  Surgeon: Chevis Pretty III, MD;  Location: [facility] OR;  Service: General;  Laterality: Left;   MASTECTOMY W/ SENTINEL NODE BIOPSY Right    MASTECTOMY W/ SENTINEL NODE BIOPSY Right 05/09/2015   Procedure: RIGHT MASTECTOMY WITH RIGHT AXILLARY SENTINEL LYMPH NODE BIOPSY;  Surgeon: Chevis Pretty III, MD;  Location: [facility] OR;  Service: General;  Laterality: Right;   PORTACATH PLACEMENT Right 09/01/2015   Procedure: INSERTION PORT-A-CATH;  Surgeon: Chevis Pretty III, MD;  Location:  SURGERY CENTER;  Service: General;  Laterality: Right;   TUBAL LIGATION   1984    FAMILY HISTORY Family History  Problem Relation Age of Onset   Hypertension Mother    Diabetes Mother    Colon cancer Neg Hx    Esophageal cancer Neg Hx    Rectal cancer Neg Hx    Stomach cancer Neg Hx     the patient has little information on her father. Her mother is living, currently age 79. She has one brother, 2 sisters. There is no history of breast or ovarian cancer in the family to her knowledge.    GYNECOLOGIC HISTORY:  No LMP recorded. Patient has had a hysterectomy.  menarche age 71, first live birth age 74. She is GX P4. She underwent a total abdominal hysterectomy with bilateral salpingo-oophorectomy at age 62. She did not take hormone replacement. She never used oral contraceptives.    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 She previously worked the same position at [employer] for 25+ years. She is single and lives alone  The patient's daughter, Margaret [name], works as a Water engineer, as does her daughter Margaret [name]. Daughter Margaret [name] works at [employer], as a call. All 3 live in [place]. The patient's son  Margaret [name]'s lives in [place]. He prepares sets for shows The patient has 4 grandchildren, no great grandchildren. She attends a [employer] in [place].    ADVANCED DIRECTIVES: Not in place. At the initial clinic visit the patient was given the appropriate forms to complete and notarize at her discretion. The patient intends to name her daughter Margaret [name] as healthcare power of attorney. She can be reached at [phone].   HEALTH MAINTENANCE: Social History   Tobacco Use   Smoking status: Former    Current packs/day: 0.00    Average packs/day: 0.1 packs/day for 28.0 years (2.8 ttl pk-yrs)    Types: Cigarettes    Start date: 09/02/1990    Quit date: 09/02/2018    Years since quitting: 4.6   Smokeless tobacco: Never  Vaping Use    Vaping status: Never Used  Substance Use Topics   Alcohol use: Not Currently    Comment: Socially   Drug use: No     Colonoscopy: Never  PAP: Status post hysterectomy  Bone density: Never  Lipid panel:  Allergies  Allergen Reactions   Banana Hives    Tongue itching   Latex Itching and Other (See Comments)    burning   Peanuts [Peanut Oil] Hives    Patient is allergic to all tree nuts   Wheat Hives    Current Outpatient Medications  Medication Sig Dispense Refill   atorvastatin (LIPITOR) 20 MG tablet Take 1 tablet (20 mg total) by mouth daily. Annual appt due in August must see provider for future refills 90 tablet 0   baclofen (LIORESAL) 20 MG tablet Take 1 tablet (20 mg total) by mouth 3 (three) times daily. 90 tablet 5   Blood Glucose Monitoring Suppl (BLOOD GLUCOSE MONITOR SYSTEM) w/Device KIT Use to test blood sugar 3 times a day 1 kit 0   busPIRone (BUSPAR) 5 MG tablet Take 1 tablet (5 mg total) by mouth daily as needed. 30 tablet 2   calcium-vitamin D (OSCAL WITH D) 500-200 MG-UNIT tablet Take 1 tablet by mouth 2 (two) times daily. 60 tablet 0   clobetasol cream (TEMOVATE) 0.05 % Apply 1 Application topically 2 (two) times daily. 30 g 3   diphenhydrAMINE (BENADRYL) 25 mg capsule Take 1 capsule (25 mg total) by mouth every 6 (six) hours as needed. (Patient taking differently: Take 25 mg by mouth every 6 (six) hours as needed for allergies.) 100 capsule 3   gabapentin (NEURONTIN) 300 MG capsule Take 2 capsules (600 mg total) by mouth 3 (three) times daily. 180 capsule 2   glimepiride (AMARYL) 4 MG tablet Take 1 tablet (4 mg total) by mouth daily before breakfast. 90 tablet 2   glucose blood test strip Use to test blood sugar 2 times a day 100 each 0   HYDROcodone-acetaminophen (NORCO/VICODIN) 5-325 MG tablet Take 1 tablet by mouth every 6 (six) hours as needed for moderate pain. 30 tablet 0   ibuprofen (ADVIL) 800 MG tablet Take 1 tablet (800 mg total) by mouth every 8  (eight) hours as needed. 90 tablet 5   Lancets (ONETOUCH DELICA PLUS LANCET33G) MISC Use to check blood sugars twice a day 100 each 0   meloxicam (MOBIC) 15 MG tablet Take 1 tablet (15 mg total) by mouth daily. 30 tablet 3   Multiple Vitamins-Minerals (CENTRUM SILVER 50+WOMEN PO) Take 1 tablet by mouth daily.     Olmesartan-amLODIPine-HCTZ 20-5-12.5 MG TABS Take 1 tablet by mouth daily. 90 tablet 3   pantoprazole (PROTONIX)  40 MG tablet TAKE 1 TABLET BY MOUTH ONCE DAILY 90 tablet 3   potassium chloride SA (KLOR-CON M) 20 MEQ tablet Take 1 tablet (20 mEq total) by mouth 2 (two) times daily. 90 tablet 0   Semaglutide, 1 MG/DOSE, 4 MG/3ML SOPN Inject 1 mg as directed once a week. 3 mL 0   Semaglutide, 2 MG/DOSE, 8 MG/3ML SOPN Inject 2 mg as directed once a week. 3 mL 1   Semaglutide,0.25 or 0.5MG /DOS, (OZEMPIC, 0.25 OR 0.5 MG/DOSE,) 2 MG/3ML SOPN Inject 0.25 mg into the skin once a week for 28 days, THEN 0.5 mg once a week for 28 days. 6 mL 0   traMADol (ULTRAM) 50 MG tablet Take 1 tablet (50 mg total) by mouth daily as needed. 30 tablet 5   traZODone (DESYREL) 100 MG tablet Take 1 tablet (100 mg total) by mouth at bedtime. 90 tablet 3   valACYclovir (VALTREX) 1000 MG tablet Take 1 tablet (1,000 mg total) by mouth 2 (two) times daily. 90 tablet 4   venlafaxine XR (EFFEXOR-XR) 150 MG 24 hr capsule Take 1 capsule (150 mg total) by mouth daily with breakfast. 90 capsule 0   No current facility-administered medications for this visit.     OBJECTIVE:  African-American woman who appears stated age  Vitals:   05/09/23 1215  BP: (!) 142/80  Pulse: 90  Resp: 18  Temp: 97.9 F (36.6 C)  SpO2: 99%   Wt Readings from Last 3 Encounters:  05/09/23 236 lb 14.4 oz (107.5 kg)  03/26/23 252 lb (114.3 kg)  12/11/22 258 lb (117 kg)   Body mass index is 35.5 kg/m.    ECOG FS:1 - Symptomatic but completely ambulatory  Physical Exam Constitutional:      Appearance: Normal appearance.  Cardiovascular:      Rate and Rhythm: Normal rate and regular rhythm.  Pulmonary:     Effort: Pulmonary effort is normal.     Breath sounds: Normal breath sounds.  Chest:     Comments: She is status post bilateral mastectomy.  No concern for recurrence. Musculoskeletal:        General: No swelling.     Cervical back: Normal range of motion and neck supple. No rigidity.  Lymphadenopathy:     Cervical: No cervical adenopathy.  Neurological:     Mental Status: She is alert.       LAB RESULTS:  CMP     Component Value Date/Time   NA 139 12/11/2022 1103   NA 142 07/24/2017 1407   K 3.7 12/11/2022 1103   K 3.3 (L) 07/24/2017 1407   CL 102 12/11/2022 1103   CO2 31 12/11/2022 1103   CO2 26 07/24/2017 1407   GLUCOSE 178 (H) 12/11/2022 1103   GLUCOSE 110 07/24/2017 1407   BUN 16 12/11/2022 1103   BUN 13.4 07/24/2017 1407   CREATININE 0.82 12/11/2022 1103   CREATININE 0.81 03/15/2022 1223   CREATININE 1.0 07/24/2017 1407   CALCIUM 9.2 12/11/2022 1103   CALCIUM 9.6 07/24/2017 1407   PROT 7.3 12/11/2022 1103   PROT 8.5 (H) 07/24/2017 1407   ALBUMIN 3.7 12/11/2022 1103   ALBUMIN 3.9 07/24/2017 1407   AST 16 12/11/2022 1103   AST 15 03/15/2022 1223   AST 23 07/24/2017 1407   ALT 22 12/11/2022 1103   ALT 20 03/15/2022 1223   ALT 28 07/24/2017 1407   ALKPHOS 108 12/11/2022 1103   ALKPHOS 84 07/24/2017 1407   BILITOT 0.2 12/11/2022 1103  BILITOT 0.3 03/15/2022 1223   BILITOT 0.30 07/24/2017 1407   GFRNONAA >60 03/15/2022 1223   GFRAA >60 03/10/2020 0919    INo results found for: "SPEP", "UPEP"  Lab Results  Component Value Date   WBC 9.1 12/11/2022   NEUTROABS 6.3 03/15/2022   HGB 13.7 12/11/2022   HCT 40.1 12/11/2022   MCV 90.9 12/11/2022   PLT 206.0 12/11/2022      Chemistry      Component Value Date/Time   NA 139 12/11/2022 1103   NA 142 07/24/2017 1407   K 3.7 12/11/2022 1103   K 3.3 (L) 07/24/2017 1407   CL 102 12/11/2022 1103   CO2 31 12/11/2022 1103   CO2 26  07/24/2017 1407   BUN 16 12/11/2022 1103   BUN 13.4 07/24/2017 1407   CREATININE 0.82 12/11/2022 1103   CREATININE 0.81 03/15/2022 1223   CREATININE 1.0 07/24/2017 1407   GLU 157 (H) 08/17/2016 0818      Component Value Date/Time   CALCIUM 9.2 12/11/2022 1103   CALCIUM 9.6 07/24/2017 1407   ALKPHOS 108 12/11/2022 1103   ALKPHOS 84 07/24/2017 1407   AST 16 12/11/2022 1103   AST 15 03/15/2022 1223   AST 23 07/24/2017 1407   ALT 22 12/11/2022 1103   ALT 20 03/15/2022 1223   ALT 28 07/24/2017 1407   BILITOT 0.2 12/11/2022 1103   BILITOT 0.3 03/15/2022 1223   BILITOT 0.30 07/24/2017 1407       No results found for: "LABCA2"  No components found for: "LABCA125"  No results for input(s): "INR" in the last 168 hours.  Urinalysis    Component Value Date/Time   COLORURINE YELLOW 12/11/2022 1103   APPEARANCEUR CLEAR 12/11/2022 1103   LABSPEC 1.020 12/11/2022 1103   PHURINE 6.5 12/11/2022 1103   GLUCOSEU NEGATIVE 12/11/2022 1103   HGBUR NEGATIVE 12/11/2022 1103   BILIRUBINUR NEGATIVE 12/11/2022 1103   KETONESUR NEGATIVE 12/11/2022 1103   PROTEINUR NEGATIVE 06/29/2015 1030   UROBILINOGEN 1.0 12/11/2022 1103   NITRITE NEGATIVE 12/11/2022 1103   LEUKOCYTESUR NEGATIVE 12/11/2022 1103    STUDIES: No results found.   ASSESSMENT: 62 y.o. [place] woman with synchronous breast cancers, as follows  (a) status post right breast upper outer quadrant biopsy 04/19/2015 for a clinical pT1c N0, stage IA invasive ductal carcinoma, grade 1, estrogen receptor positive, progesterone receptor negative, with an MIB-1 of 5%, and no HER-2 amplification  (b) status post left breast upper outer quadrant biopsy 2 and left axillary lymph node biopsy 04/19/2015 for a clinical T3 N1, stage IIIA invasive ductal carcinoma, grade 1 or 2, estrogen receptor and progesterone receptor positive, HER-2 negative, with an MIB-1 between 5 and 10%  (1) status post bilateral mastectomies 05/09/2015,  showing:   (a) on the right side, a pT2 pN0, stage IIA invasive ductal carcinoma, with negative margins and repeat HER-2 again negative    (b) on the left, a pT3 pN2, stage IIIA invasive ductal carcinoma, grade 2, with negative margins, and repeat HER-2 again negative  (2) Mammaprint from the left-sided tumor returned "luminal type, low risk", predicting a small benefit from chemotherapy with the important caveat that N2 disease was not included in the MINDACT study--patient opted for chemotherapy  (3) Oncotype from the right-sided tumor showed a score of 12, predicting an 8% risk of recurrence outside the breast within the next 10 years, if the patient's only systemic therapy was tamoxifen for 5 years. It also predicted no significant benefit from chemotherapy  (  4) postmastectomy radiation to the left chest wall completed 08/15/2015  (5) adjuvant chemotherapy consisting of cyclophosphamide and docetaxel x4 completed on 11/21/15.  (5) anastrozole started 02/08/2016, completing 5 years August 2022  (6) left upper extremity lymphedema  (a) negative Doppler study 11/02/2015  (7) enrolled in B-WELL study 02/08/2016, follow-up for 10 years planned  (8) participating in P[place]AS trial as of August 2017, 10-year follow-up planned  (a) randomized to palbociclib, starting October 2017  (b) completed 2 years of palbociclib October 2019, with no dose reductions necessary Completed 5 yrs of anastrozole   PLAN:  Post-Mastectomy Pain Syndrome Reports of persistent pain and sensitivity in the surgical area. No new lumps or masses palpated on examination. -Continue Gabapentin as prescribed by Dr. Okey Dupre for neuropathic pain.  Ankle Swelling Mild ankle swelling noted on examination. -Monitor for progression or associated symptoms.  General Health Maintenance -Continue Trazodone as needed for sleep. -Plan to receive annual COVID-19 vaccination. -Continue participation in the [place] and [place]  studies. -Follow-up visit in one year.  *Total Encounter Time as defined by the Centers for Medicare and Medicaid Services includes, in addition to the face-to-face time of a patient visit (documented in the note above) non-face-to-face time: obtaining and reviewing outside history, ordering and reviewing medications, tests or procedures, care coordination (communications with other health care professionals or caregivers) and documentation in the medical record.

## 2023-05-09 NOTE — Research (Signed)
05/09/2023   AFT-05 PALLAS - Follow-Up Phase I Month 84  Patient into clinic today unaccompanied for routine follow-up visit.  Research Labs- Year 7 research labs collected per protocol.    Anti-cancer medications - Patient states she is not on any new anti-cancer medications.     Serious adverse events - Patient denies any new, serious health problems or hospitalizations since her last visit.    Disease monitoring - See MD note from Dr. Al Pimple today.   COVID-19 - She denies any COVID-19 infection since last study visit.  Plan- Patient will continue in Follow Up Phase 2 on study with annual follow-up.  This will be done in conjunction with the BWEL study annual visits. Patient agreed with this plan. Thanked patient for her participation in this research study. Encouraged her to call research nurse if any questions/concerns prior to next visit. She verbalized understanding.  Domenica Reamer, BSN, RN, [employer] Research Nurse II [phone] 05/09/2023 1:35 PM

## 2023-05-09 NOTE — Research (Signed)
[facility] [id number] [facility] - Month 84 visit  Patient into clinic today accompanied by her husband for routine follow-up visit.    PROs- Follow-up questionnaires provided to patient at beginning of visit and then collected and checked for completeness and accuracy prior to MD appointment.  Patient checked "yes" that she was taking an aromatase inhibitor and tamoxifen.  Clarified with patient that she is not currently taking these meds, they were discontinued a few years ago.  Patient confirmed, she thought the question was asking if she has ever taken those meds. Answers were corrected.   Disease status/outcome assessment - See MD note from Dr. Al Pimple today.  Weight and waist and hip circumference measurements - Obtained per protocol and following the instructions in the [facility] Weight and Height Protocol document. Waist and hip measured in centimeters.  Weight; 107.68 kg and 107.64 kg.  Waist;  110 cm and 111 cm Hip; 133 cm and 133.5 cm  Plan- Patient understands that she will continue in the follow-up phase including ongoing measurements and H&Ps to occur annually and agrees to this plan. Thanked patient for her study participation. Encouraged her to call research nurse if any questions/concerns prior to next visit. She verbalized understanding.   Domenica Reamer, BSN, RN, [employer] Research Nurse II [phone] 05/09/2023 1:32 PM

## 2023-05-16 ENCOUNTER — Other Ambulatory Visit: Payer: Self-pay

## 2023-05-16 ENCOUNTER — Other Ambulatory Visit: Payer: Self-pay | Admitting: Internal Medicine

## 2023-05-16 ENCOUNTER — Other Ambulatory Visit (HOSPITAL_COMMUNITY): Payer: Self-pay

## 2023-05-16 DIAGNOSIS — Z170 Estrogen receptor positive status [ER+]: Secondary | ICD-10-CM

## 2023-05-16 MED ORDER — IBUPROFEN 800 MG PO TABS
800.0000 mg | ORAL_TABLET | Freq: Three times a day (TID) | ORAL | 1 refills | Status: DC | PRN
  Filled 2023-05-16: qty 90, 30d supply, fill #0
  Filled 2023-06-26: qty 90, 30d supply, fill #1

## 2023-05-16 MED ORDER — PANTOPRAZOLE SODIUM 40 MG PO TBEC
DELAYED_RELEASE_TABLET | Freq: Every day | ORAL | 3 refills | Status: DC
  Filled 2023-05-16: qty 90, 90d supply, fill #0
  Filled 2023-09-10: qty 90, 90d supply, fill #1
  Filled 2023-12-05: qty 90, 90d supply, fill #2
  Filled 2024-03-04: qty 90, 90d supply, fill #3

## 2023-05-17 ENCOUNTER — Other Ambulatory Visit: Payer: Self-pay

## 2023-05-29 ENCOUNTER — Telehealth: Payer: Self-pay | Admitting: Internal Medicine

## 2023-05-29 NOTE — Telephone Encounter (Signed)
Prescription Request  05/29/2023  LOV: 03/26/2023  What is the name of the medication or equipment? HYDROcodone-acetaminophen (NORCO/VICODIN) 5-325 MG tablet   Have you contacted your pharmacy to request a refill? No   Which pharmacy would you like this sent to?  [facility] [address] [place] [address] Phone: [phone] Fax: [phone]    Patient notified that their request is being sent to the clinical staff for review and that they should receive a response within 2 business days.   Please advise at Mobile [phone] (mobile)

## 2023-05-31 NOTE — Telephone Encounter (Signed)
This hydrocodone was short term only. Tramadol is her chronic pain medication.

## 2023-06-04 ENCOUNTER — Other Ambulatory Visit (HOSPITAL_COMMUNITY): Payer: Self-pay

## 2023-06-06 NOTE — Telephone Encounter (Signed)
Pt verbalized that she understood the doctors not and asked if she can just have her tramadol refilled.

## 2023-06-10 ENCOUNTER — Telehealth: Payer: Self-pay | Admitting: Internal Medicine

## 2023-06-10 NOTE — Telephone Encounter (Signed)
She filled tramadol on 06/04/23 so refill not needed. Is there anything else she needs?

## 2023-06-10 NOTE — Telephone Encounter (Signed)
No there wasn't anything else

## 2023-06-10 NOTE — Telephone Encounter (Signed)
Prescription Request  06/10/2023  LOV: 03/26/2023  What is the name of the medication or equipment? Ozempic  Have you contacted your pharmacy to request a refill? Yes   Which pharmacy would you like this sent to?  [place] [facility] [address] [place] [address] Phone: [phone] Fax: [phone]    Patient notified that their request is being sent to the clinical staff for review and that they should receive a response within 2 business days.   Please advise at Mobile [phone] (mobile)

## 2023-06-11 ENCOUNTER — Other Ambulatory Visit (HOSPITAL_COMMUNITY): Payer: Self-pay

## 2023-06-11 ENCOUNTER — Other Ambulatory Visit: Payer: Self-pay

## 2023-06-11 MED ORDER — SEMAGLUTIDE (2 MG/DOSE) 8 MG/3ML ~~LOC~~ SOPN
2.0000 mg | PEN_INJECTOR | SUBCUTANEOUS | 1 refills | Status: DC
  Filled 2023-06-11: qty 3, 28d supply, fill #0
  Filled 2023-08-02: qty 3, 28d supply, fill #1

## 2023-06-12 ENCOUNTER — Other Ambulatory Visit: Payer: Self-pay | Admitting: Hematology and Oncology

## 2023-06-12 ENCOUNTER — Other Ambulatory Visit: Payer: Self-pay

## 2023-06-12 ENCOUNTER — Other Ambulatory Visit (HOSPITAL_COMMUNITY): Payer: Self-pay

## 2023-06-12 DIAGNOSIS — G629 Polyneuropathy, unspecified: Secondary | ICD-10-CM

## 2023-06-12 DIAGNOSIS — C50011 Malignant neoplasm of nipple and areola, right female breast: Secondary | ICD-10-CM

## 2023-06-12 MED ORDER — GABAPENTIN 300 MG PO CAPS
600.0000 mg | ORAL_CAPSULE | Freq: Three times a day (TID) | ORAL | 2 refills | Status: DC
  Filled 2023-06-12: qty 180, 30d supply, fill #0
  Filled 2023-08-02: qty 180, 30d supply, fill #1
  Filled 2023-09-09 – 2023-09-10 (×2): qty 180, 30d supply, fill #2

## 2023-06-21 ENCOUNTER — Other Ambulatory Visit: Payer: Self-pay | Admitting: Internal Medicine

## 2023-06-21 ENCOUNTER — Other Ambulatory Visit: Payer: Self-pay

## 2023-06-21 ENCOUNTER — Other Ambulatory Visit (HOSPITAL_COMMUNITY): Payer: Self-pay

## 2023-06-21 MED ORDER — ATORVASTATIN CALCIUM 20 MG PO TABS
20.0000 mg | ORAL_TABLET | Freq: Every day | ORAL | 0 refills | Status: DC
  Filled 2023-06-21: qty 90, 90d supply, fill #0

## 2023-06-24 ENCOUNTER — Other Ambulatory Visit: Payer: Self-pay

## 2023-06-24 ENCOUNTER — Other Ambulatory Visit (HOSPITAL_COMMUNITY): Payer: Self-pay

## 2023-06-24 MED ORDER — TRAMADOL HCL 50 MG PO TABS
50.0000 mg | ORAL_TABLET | Freq: Every day | ORAL | 0 refills | Status: DC | PRN
  Filled 2023-06-24 – 2023-07-03 (×3): qty 30, 30d supply, fill #0

## 2023-06-25 ENCOUNTER — Other Ambulatory Visit (HOSPITAL_COMMUNITY): Payer: Self-pay

## 2023-06-26 ENCOUNTER — Other Ambulatory Visit: Payer: Self-pay | Admitting: Internal Medicine

## 2023-06-26 ENCOUNTER — Other Ambulatory Visit: Payer: Self-pay

## 2023-06-26 ENCOUNTER — Other Ambulatory Visit (HOSPITAL_COMMUNITY): Payer: Self-pay

## 2023-06-26 MED ORDER — POTASSIUM CHLORIDE CRYS ER 20 MEQ PO TBCR
20.0000 meq | EXTENDED_RELEASE_TABLET | Freq: Two times a day (BID) | ORAL | 0 refills | Status: DC
  Filled 2023-06-26: qty 90, 45d supply, fill #0

## 2023-07-01 ENCOUNTER — Other Ambulatory Visit: Payer: Self-pay

## 2023-07-01 ENCOUNTER — Inpatient Hospital Stay (HOSPITAL_COMMUNITY)
Admission: EM | Admit: 2023-07-01 | Discharge: 2023-07-08 | DRG: 988 | Disposition: A | Discharge status: Home / Self Care | Payer: 59 | Attending: Internal Medicine | Admitting: Internal Medicine

## 2023-07-01 ENCOUNTER — Emergency Department (HOSPITAL_COMMUNITY): Payer: 59

## 2023-07-01 ENCOUNTER — Encounter (HOSPITAL_COMMUNITY): Payer: Self-pay | Admitting: Emergency Medicine

## 2023-07-01 DIAGNOSIS — E119 Type 2 diabetes mellitus without complications: Secondary | ICD-10-CM | POA: Diagnosis not present

## 2023-07-01 DIAGNOSIS — Z8249 Family history of ischemic heart disease and other diseases of the circulatory system: Secondary | ICD-10-CM

## 2023-07-01 DIAGNOSIS — E876 Hypokalemia: Secondary | ICD-10-CM | POA: Diagnosis not present

## 2023-07-01 DIAGNOSIS — Z853 Personal history of malignant neoplasm of breast: Secondary | ICD-10-CM | POA: Diagnosis not present

## 2023-07-01 DIAGNOSIS — I700 Atherosclerosis of aorta: Secondary | ICD-10-CM | POA: Diagnosis not present

## 2023-07-01 DIAGNOSIS — M1990 Unspecified osteoarthritis, unspecified site: Secondary | ICD-10-CM | POA: Diagnosis present

## 2023-07-01 DIAGNOSIS — K219 Gastro-esophageal reflux disease without esophagitis: Secondary | ICD-10-CM | POA: Diagnosis present

## 2023-07-01 DIAGNOSIS — N179 Acute kidney failure, unspecified: Secondary | ICD-10-CM | POA: Diagnosis not present

## 2023-07-01 DIAGNOSIS — G9340 Encephalopathy, unspecified: Secondary | ICD-10-CM | POA: Diagnosis present

## 2023-07-01 DIAGNOSIS — R6510 Systemic inflammatory response syndrome (SIRS) of non-infectious origin without acute organ dysfunction: Secondary | ICD-10-CM | POA: Diagnosis present

## 2023-07-01 DIAGNOSIS — Z833 Family history of diabetes mellitus: Secondary | ICD-10-CM

## 2023-07-01 DIAGNOSIS — A419 Sepsis, unspecified organism: Principal | ICD-10-CM | POA: Diagnosis present

## 2023-07-01 DIAGNOSIS — Z6836 Body mass index (BMI) 36.0-36.9, adult: Secondary | ICD-10-CM

## 2023-07-01 DIAGNOSIS — Z9013 Acquired absence of bilateral breasts and nipples: Secondary | ICD-10-CM

## 2023-07-01 DIAGNOSIS — F4323 Adjustment disorder with mixed anxiety and depressed mood: Secondary | ICD-10-CM | POA: Diagnosis present

## 2023-07-01 DIAGNOSIS — D329 Benign neoplasm of meninges, unspecified: Secondary | ICD-10-CM | POA: Diagnosis not present

## 2023-07-01 DIAGNOSIS — K759 Inflammatory liver disease, unspecified: Secondary | ICD-10-CM | POA: Diagnosis not present

## 2023-07-01 DIAGNOSIS — R000 Tachycardia, unspecified: Secondary | ICD-10-CM | POA: Diagnosis not present

## 2023-07-01 DIAGNOSIS — R4701 Aphasia: Secondary | ICD-10-CM | POA: Diagnosis not present

## 2023-07-01 DIAGNOSIS — Z791 Long term (current) use of non-steroidal anti-inflammatories (NSAID): Secondary | ICD-10-CM

## 2023-07-01 DIAGNOSIS — K8012 Calculus of gallbladder with acute and chronic cholecystitis without obstruction: Secondary | ICD-10-CM | POA: Diagnosis not present

## 2023-07-01 DIAGNOSIS — Z743 Need for continuous supervision: Secondary | ICD-10-CM | POA: Diagnosis not present

## 2023-07-01 DIAGNOSIS — Z888 Allergy status to other drugs, medicaments and biological substances status: Secondary | ICD-10-CM

## 2023-07-01 DIAGNOSIS — R410 Disorientation, unspecified: Secondary | ICD-10-CM | POA: Diagnosis not present

## 2023-07-01 DIAGNOSIS — Z79899 Other long term (current) drug therapy: Secondary | ICD-10-CM | POA: Diagnosis not present

## 2023-07-01 DIAGNOSIS — Z1389 Encounter for screening for other disorder: Secondary | ICD-10-CM | POA: Diagnosis not present

## 2023-07-01 DIAGNOSIS — B004 Herpesviral encephalitis: Principal | ICD-10-CM | POA: Diagnosis present

## 2023-07-01 DIAGNOSIS — Z87891 Personal history of nicotine dependence: Secondary | ICD-10-CM | POA: Diagnosis not present

## 2023-07-01 DIAGNOSIS — I499 Cardiac arrhythmia, unspecified: Secondary | ICD-10-CM | POA: Diagnosis not present

## 2023-07-01 DIAGNOSIS — G9341 Metabolic encephalopathy: Secondary | ICD-10-CM | POA: Diagnosis not present

## 2023-07-01 DIAGNOSIS — I6523 Occlusion and stenosis of bilateral carotid arteries: Secondary | ICD-10-CM | POA: Diagnosis not present

## 2023-07-01 DIAGNOSIS — I10 Essential (primary) hypertension: Secondary | ICD-10-CM | POA: Diagnosis present

## 2023-07-01 DIAGNOSIS — B1009 Other human herpesvirus encephalitis: Secondary | ICD-10-CM | POA: Diagnosis not present

## 2023-07-01 DIAGNOSIS — Z91018 Allergy to other foods: Secondary | ICD-10-CM | POA: Diagnosis not present

## 2023-07-01 DIAGNOSIS — G039 Meningitis, unspecified: Secondary | ICD-10-CM

## 2023-07-01 DIAGNOSIS — R569 Unspecified convulsions: Secondary | ICD-10-CM | POA: Diagnosis not present

## 2023-07-01 DIAGNOSIS — Z7984 Long term (current) use of oral hypoglycemic drugs: Secondary | ICD-10-CM

## 2023-07-01 DIAGNOSIS — R4182 Altered mental status, unspecified: Secondary | ICD-10-CM | POA: Diagnosis not present

## 2023-07-01 DIAGNOSIS — Z91040 Latex allergy status: Secondary | ICD-10-CM

## 2023-07-01 DIAGNOSIS — N200 Calculus of kidney: Secondary | ICD-10-CM | POA: Diagnosis not present

## 2023-07-01 DIAGNOSIS — Z91010 Allergy to peanuts: Secondary | ICD-10-CM

## 2023-07-01 DIAGNOSIS — R220 Localized swelling, mass and lump, head: Secondary | ICD-10-CM | POA: Diagnosis not present

## 2023-07-01 LAB — CBC WITH DIFFERENTIAL/PLATELET
Abs Immature Granulocytes: 0.07 10*3/uL (ref 0.00–0.07)
Basophils Absolute: 0.1 10*3/uL (ref 0.0–0.1)
Basophils Relative: 1 %
Eosinophils Absolute: 0 10*3/uL (ref 0.0–0.5)
Eosinophils Relative: 0 %
HCT: 48.8 % — ABNORMAL HIGH (ref 36.0–46.0)
Hemoglobin: 15.6 g/dL — ABNORMAL HIGH (ref 12.0–15.0)
Immature Granulocytes: 0 %
Lymphocytes Relative: 11 %
Lymphs Abs: 2 10*3/uL (ref 0.7–4.0)
MCH: 29.2 pg (ref 26.0–34.0)
MCHC: 32 g/dL (ref 30.0–36.0)
MCV: 91.4 fL (ref 80.0–100.0)
Monocytes Absolute: 0.8 10*3/uL (ref 0.1–1.0)
Monocytes Relative: 5 %
Neutro Abs: 14.8 10*3/uL — ABNORMAL HIGH (ref 1.7–7.7)
Neutrophils Relative %: 83 %
Platelets: 211 10*3/uL (ref 150–400)
RBC: 5.34 MIL/uL — ABNORMAL HIGH (ref 3.87–5.11)
RDW: 14.4 % (ref 11.5–15.5)
WBC: 17.8 10*3/uL — ABNORMAL HIGH (ref 4.0–10.5)
nRBC: 0 % (ref 0.0–0.2)

## 2023-07-01 LAB — CBG MONITORING, ED: Glucose-Capillary: 164 mg/dL — ABNORMAL HIGH (ref 70–99)

## 2023-07-01 NOTE — ED Triage Notes (Signed)
Pt BIB EMS from home, reports "cold like symptoms" for the past couple days. Today started hyperventilating and repeating statements over and over. Unable to tell this RN why she is in the hospital, states "sorry sorry sorry" while attempting to ask orientation questions. States that her family should be here to tell why she is here in the hospital. EMS reports that pt takes Effexor at home.

## 2023-07-01 NOTE — ED Provider Notes (Signed)
[place] EMERGENCY DEPARTMENT AT [facility] Provider Note   CSN: [id number] Arrival date & time: 07/01/23  2313     History {Add pertinent medical, surgical, social history, OB history to HPI:1} Chief Complaint  Patient presents with   Altered Mental Status    Margaret [name] is a 62 y.o. female.   Altered Mental Status      Home Medications Prior to Admission medications   Medication Sig Start Date End Date Taking? Authorizing Provider  atorvastatin (LIPITOR) 20 MG tablet Take 1 tablet (20 mg total) by mouth daily. Annual appt due in August must see provider for future refills 06/21/23   [name], MD  baclofen (LIORESAL) 20 MG tablet Take 1 tablet (20 mg total) by mouth 3 (three) times daily. 03/05/23   [name], MD  Blood Glucose Monitoring Suppl (BLOOD GLUCOSE MONITOR SYSTEM) w/Device KIT Use to test blood sugar 3 times a day 12/28/20   [name], MD  busPIRone (BUSPAR) 5 MG tablet Take 1 tablet (5 mg total) by mouth daily as needed. 06/19/22   [name], MD  calcium-vitamin D (OSCAL WITH D) 500-200 MG-UNIT tablet Take 1 tablet by mouth 2 (two) times daily. 05/06/17   Loa Socks, NP  clobetasol cream (TEMOVATE) 0.05 % Apply 1 Application topically 2 (two) times daily. 03/26/23   [name], MD  diphenhydrAMINE (BENADRYL) 25 mg capsule Take 1 capsule (25 mg total) by mouth every 6 (six) hours as needed. Patient taking differently: Take 25 mg by mouth every 6 (six) hours as needed for allergies. 01/31/17   Magrinat, Valentino Hue, MD  gabapentin (NEURONTIN) 300 MG capsule Take 2 capsules (600 mg total) by mouth 3 (three) times daily. 06/12/23   Rachel Moulds, MD  glimepiride (AMARYL) 4 MG tablet Take 1 tablet (4 mg total) by mouth daily before breakfast. 03/15/23   [name], MD  glucose blood test strip Use to test blood sugar 2 times a day 01/21/23   [name], MD  ibuprofen  (ADVIL) 800 MG tablet Take 1 tablet (800 mg total) by mouth every 8 (eight) hours as needed. 05/16/23   [name], MD  Lancets [facility] DELICA PLUS [place]) MISC Use to check blood sugars twice a day 01/21/23   [name], MD  meloxicam (MOBIC) 15 MG tablet Take 1 tablet (15 mg total) by mouth daily. 01/15/23   Hyatt, Max T, DPM  Multiple Vitamins-Minerals (CENTRUM SILVER 50+WOMEN PO) Take 1 tablet by mouth daily.    [provider]  Olmesartan-amLODIPine-HCTZ 20-5-12.5 MG TABS Take 1 tablet by mouth daily. 12/11/22   [name], MD  pantoprazole (PROTONIX) 40 MG tablet TAKE 1 TABLET BY MOUTH ONCE DAILY 05/16/23   [name], MD  potassium chloride SA (KLOR-CON M) 20 MEQ tablet Take 1 tablet (20 mEq total) by mouth 2 (two) times daily. 06/26/23   [name], MD  Semaglutide, 1 MG/DOSE, 4 MG/3ML SOPN Inject 1 mg as directed once a week. 12/14/22   [name], MD  Semaglutide, 2 MG/DOSE, 8 MG/3ML SOPN Inject 2 mg as directed once a week. 06/11/23   [name], MD  traMADol (ULTRAM) 50 MG tablet Take 1 tablet (50 mg total) by mouth daily as needed. 06/24/23   [name], MD  traZODone (DESYREL) 100 MG tablet Take 1 tablet (100 mg total) by mouth at bedtime. 03/26/23   [name], MD  valACYclovir (VALTREX) 1000 MG tablet Take 1 tablet (1,000 mg total) by mouth 2 (two) times daily. 11/16/22   Rachel Moulds, MD  venlafaxine XR (EFFEXOR-XR) 150 MG 24 hr capsule Take 1 capsule (150 mg total) by mouth daily with breakfast. 03/26/23   [name], MD  losartan-hydrochlorothiazide [facility]) 100-25 MG tablet TAKE 1 TABLET BY MOUTH ONCE DAILY 08/19/20 09/15/20  Magrinat, Valentino Hue, MD      Allergies    Banana, Latex, Peanuts [peanut oil], and Wheat    Review of Systems   Review of Systems  Physical Exam Updated Vital Signs BP (!) 160/92 (BP Location: Left Arm)   Pulse (!) 111   Temp 99.1 F  (37.3 C) (Oral)   Resp (!) 21   Ht 5' 8.5" (1.74 m)   Wt 107.5 kg   SpO2 96%   BMI 35.51 kg/m  Physical Exam  ED Results / Procedures / Treatments   Labs (all labs ordered are listed, but only abnormal results are displayed) Labs Reviewed  CBG MONITORING, ED - Abnormal; Notable for the following components:      Result Value   Glucose-Capillary 164 (*)    All other components within normal limits  SARS CORONAVIRUS 2 BY RT PCR  COMPREHENSIVE METABOLIC PANEL  ETHANOL  CBC WITH DIFFERENTIAL/PLATELET  URINALYSIS, W/ REFLEX TO CULTURE (INFECTION SUSPECTED)  RAPID URINE DRUG SCREEN, HOSP PERFORMED  TROPONIN I (HIGH SENSITIVITY)    EKG None  Radiology No results found.  Procedures Procedures  {Document cardiac monitor, telemetry assessment procedure when appropriate:1}  Medications Ordered in ED Medications - No data to display  ED Course/ Medical Decision Making/ A&P   {   Click here for ABCD2, HEART and other calculatorsREFRESH Note before signing :1}                              Medical Decision Making Amount and/or Complexity of Data Reviewed Labs: ordered. Radiology: ordered.   ***  {Document critical care time when appropriate:1} {Document review of labs and clinical decision tools ie heart score, Chads2Vasc2 etc:1}  {Document your independent review of radiology images, and any outside records:1} {Document your discussion with family members, caretakers, and with consultants:1} {Document social determinants of health affecting pt's care:1} {Document your decision making why or why not admission, treatments were needed:1} Final Clinical Impression(s) / ED Diagnoses Final diagnoses:  None    Rx / DC Orders ED Discharge Orders     None

## 2023-07-02 ENCOUNTER — Emergency Department (HOSPITAL_COMMUNITY): Payer: 59

## 2023-07-02 ENCOUNTER — Inpatient Hospital Stay (HOSPITAL_COMMUNITY): Payer: 59

## 2023-07-02 ENCOUNTER — Encounter (HOSPITAL_COMMUNITY): Payer: Self-pay | Admitting: Family Medicine

## 2023-07-02 ENCOUNTER — Other Ambulatory Visit (HOSPITAL_COMMUNITY): Payer: 59

## 2023-07-02 DIAGNOSIS — B004 Herpesviral encephalitis: Secondary | ICD-10-CM | POA: Diagnosis present

## 2023-07-02 DIAGNOSIS — R410 Disorientation, unspecified: Secondary | ICD-10-CM | POA: Diagnosis not present

## 2023-07-02 DIAGNOSIS — G9341 Metabolic encephalopathy: Secondary | ICD-10-CM | POA: Diagnosis present

## 2023-07-02 DIAGNOSIS — E119 Type 2 diabetes mellitus without complications: Secondary | ICD-10-CM | POA: Diagnosis not present

## 2023-07-02 DIAGNOSIS — R1011 Right upper quadrant pain: Secondary | ICD-10-CM | POA: Diagnosis not present

## 2023-07-02 DIAGNOSIS — B1009 Other human herpesvirus encephalitis: Secondary | ICD-10-CM | POA: Diagnosis not present

## 2023-07-02 DIAGNOSIS — R6510 Systemic inflammatory response syndrome (SIRS) of non-infectious origin without acute organ dysfunction: Secondary | ICD-10-CM | POA: Diagnosis present

## 2023-07-02 DIAGNOSIS — I6523 Occlusion and stenosis of bilateral carotid arteries: Secondary | ICD-10-CM | POA: Diagnosis not present

## 2023-07-02 DIAGNOSIS — N200 Calculus of kidney: Secondary | ICD-10-CM | POA: Diagnosis not present

## 2023-07-02 DIAGNOSIS — Z91010 Allergy to peanuts: Secondary | ICD-10-CM | POA: Diagnosis not present

## 2023-07-02 DIAGNOSIS — R569 Unspecified convulsions: Secondary | ICD-10-CM

## 2023-07-02 DIAGNOSIS — Z7984 Long term (current) use of oral hypoglycemic drugs: Secondary | ICD-10-CM | POA: Diagnosis not present

## 2023-07-02 DIAGNOSIS — Z87891 Personal history of nicotine dependence: Secondary | ICD-10-CM | POA: Diagnosis not present

## 2023-07-02 DIAGNOSIS — R4182 Altered mental status, unspecified: Secondary | ICD-10-CM | POA: Diagnosis present

## 2023-07-02 DIAGNOSIS — R1084 Generalized abdominal pain: Secondary | ICD-10-CM | POA: Diagnosis not present

## 2023-07-02 DIAGNOSIS — N281 Cyst of kidney, acquired: Secondary | ICD-10-CM | POA: Diagnosis not present

## 2023-07-02 DIAGNOSIS — D329 Benign neoplasm of meninges, unspecified: Secondary | ICD-10-CM | POA: Diagnosis present

## 2023-07-02 DIAGNOSIS — A419 Sepsis, unspecified organism: Secondary | ICD-10-CM | POA: Diagnosis present

## 2023-07-02 DIAGNOSIS — R4701 Aphasia: Secondary | ICD-10-CM | POA: Diagnosis present

## 2023-07-02 DIAGNOSIS — E876 Hypokalemia: Secondary | ICD-10-CM | POA: Diagnosis not present

## 2023-07-02 DIAGNOSIS — G9340 Encephalopathy, unspecified: Secondary | ICD-10-CM | POA: Diagnosis present

## 2023-07-02 DIAGNOSIS — Z791 Long term (current) use of non-steroidal anti-inflammatories (NSAID): Secondary | ICD-10-CM | POA: Diagnosis not present

## 2023-07-02 DIAGNOSIS — Z79899 Other long term (current) drug therapy: Secondary | ICD-10-CM | POA: Diagnosis not present

## 2023-07-02 DIAGNOSIS — R519 Headache, unspecified: Secondary | ICD-10-CM | POA: Diagnosis not present

## 2023-07-02 DIAGNOSIS — K759 Inflammatory liver disease, unspecified: Secondary | ICD-10-CM | POA: Diagnosis present

## 2023-07-02 DIAGNOSIS — R220 Localized swelling, mass and lump, head: Secondary | ICD-10-CM | POA: Diagnosis not present

## 2023-07-02 DIAGNOSIS — K8010 Calculus of gallbladder with chronic cholecystitis without obstruction: Secondary | ICD-10-CM | POA: Diagnosis not present

## 2023-07-02 DIAGNOSIS — I10 Essential (primary) hypertension: Secondary | ICD-10-CM | POA: Diagnosis not present

## 2023-07-02 DIAGNOSIS — K828 Other specified diseases of gallbladder: Secondary | ICD-10-CM | POA: Diagnosis not present

## 2023-07-02 DIAGNOSIS — K219 Gastro-esophageal reflux disease without esophagitis: Secondary | ICD-10-CM | POA: Diagnosis present

## 2023-07-02 DIAGNOSIS — N179 Acute kidney failure, unspecified: Secondary | ICD-10-CM | POA: Diagnosis not present

## 2023-07-02 DIAGNOSIS — Z853 Personal history of malignant neoplasm of breast: Secondary | ICD-10-CM | POA: Diagnosis not present

## 2023-07-02 DIAGNOSIS — Z1389 Encounter for screening for other disorder: Secondary | ICD-10-CM | POA: Diagnosis not present

## 2023-07-02 DIAGNOSIS — Z6836 Body mass index (BMI) 36.0-36.9, adult: Secondary | ICD-10-CM | POA: Diagnosis not present

## 2023-07-02 DIAGNOSIS — K8012 Calculus of gallbladder with acute and chronic cholecystitis without obstruction: Secondary | ICD-10-CM | POA: Diagnosis present

## 2023-07-02 DIAGNOSIS — R509 Fever, unspecified: Secondary | ICD-10-CM | POA: Diagnosis not present

## 2023-07-02 DIAGNOSIS — F4323 Adjustment disorder with mixed anxiety and depressed mood: Secondary | ICD-10-CM | POA: Diagnosis present

## 2023-07-02 DIAGNOSIS — Z91018 Allergy to other foods: Secondary | ICD-10-CM | POA: Diagnosis not present

## 2023-07-02 DIAGNOSIS — K819 Cholecystitis, unspecified: Secondary | ICD-10-CM | POA: Diagnosis not present

## 2023-07-02 DIAGNOSIS — M1990 Unspecified osteoarthritis, unspecified site: Secondary | ICD-10-CM | POA: Diagnosis present

## 2023-07-02 DIAGNOSIS — G039 Meningitis, unspecified: Secondary | ICD-10-CM | POA: Diagnosis not present

## 2023-07-02 DIAGNOSIS — K8020 Calculus of gallbladder without cholecystitis without obstruction: Secondary | ICD-10-CM | POA: Diagnosis not present

## 2023-07-02 LAB — CBC
HCT: 43.1 % (ref 36.0–46.0)
Hemoglobin: 14 g/dL (ref 12.0–15.0)
MCH: 29.4 pg (ref 26.0–34.0)
MCHC: 32.5 g/dL (ref 30.0–36.0)
MCV: 90.4 fL (ref 80.0–100.0)
Platelets: 191 10*3/uL (ref 150–400)
RBC: 4.77 MIL/uL (ref 3.87–5.11)
RDW: 14.2 % (ref 11.5–15.5)
WBC: 15.7 10*3/uL — ABNORMAL HIGH (ref 4.0–10.5)
nRBC: 0 % (ref 0.0–0.2)

## 2023-07-02 LAB — URINALYSIS, W/ REFLEX TO CULTURE (INFECTION SUSPECTED)
Bacteria, UA: NONE SEEN
Bilirubin Urine: NEGATIVE
Glucose, UA: NEGATIVE mg/dL
Hgb urine dipstick: NEGATIVE
Ketones, ur: 20 mg/dL — AB
Leukocytes,Ua: NEGATIVE
Nitrite: NEGATIVE
Protein, ur: 30 mg/dL — AB
Specific Gravity, Urine: >1.046 — ABNORMAL HIGH (ref 1.005–1.030)
pH: 6 (ref 5.0–8.0)

## 2023-07-02 LAB — COMPREHENSIVE METABOLIC PANEL
ALT: 22 U/L (ref 0–44)
ALT: 23 U/L (ref 0–44)
AST: 23 U/L (ref 15–41)
AST: 25 U/L (ref 15–41)
Albumin: 3.8 g/dL (ref 3.5–5.0)
Albumin: 4.1 g/dL (ref 3.5–5.0)
Alkaline Phosphatase: 74 U/L (ref 38–126)
Alkaline Phosphatase: 77 U/L (ref 38–126)
Anion gap: 12 (ref 5–15)
Anion gap: 13 (ref 5–15)
BUN: 14 mg/dL (ref 8–23)
BUN: 15 mg/dL (ref 8–23)
CO2: 21 mmol/L — ABNORMAL LOW (ref 22–32)
CO2: 22 mmol/L (ref 22–32)
Calcium: 9.6 mg/dL (ref 8.9–10.3)
Calcium: 9.6 mg/dL (ref 8.9–10.3)
Chloride: 100 mmol/L (ref 98–111)
Chloride: 100 mmol/L (ref 98–111)
Creatinine, Ser: 0.75 mg/dL (ref 0.44–1.00)
Creatinine, Ser: 0.87 mg/dL (ref 0.44–1.00)
GFR, Estimated: >60 mL/min (ref >60)
GFR, Estimated: >60 mL/min (ref >60)
Glucose, Bld: 131 mg/dL — ABNORMAL HIGH (ref 70–99)
Glucose, Bld: 156 mg/dL — ABNORMAL HIGH (ref 70–99)
Potassium: 3.1 mmol/L — ABNORMAL LOW (ref 3.5–5.1)
Potassium: 3.2 mmol/L — ABNORMAL LOW (ref 3.5–5.1)
Sodium: 134 mmol/L — ABNORMAL LOW (ref 135–145)
Sodium: 134 mmol/L — ABNORMAL LOW (ref 135–145)
Total Bilirubin: 0.6 mg/dL (ref <1.2)
Total Bilirubin: 0.7 mg/dL (ref <1.2)
Total Protein: 7.4 g/dL (ref 6.5–8.1)
Total Protein: 8.4 g/dL — ABNORMAL HIGH (ref 6.5–8.1)

## 2023-07-02 LAB — RAPID URINE DRUG SCREEN, HOSP PERFORMED
Amphetamines: NOT DETECTED
Barbiturates: NOT DETECTED
Benzodiazepines: NOT DETECTED
Cocaine: NOT DETECTED
Opiates: NOT DETECTED
Tetrahydrocannabinol: POSITIVE — AB

## 2023-07-02 LAB — GLUCOSE, CAPILLARY
Glucose-Capillary: 87 mg/dL (ref 70–99)
Glucose-Capillary: 108 mg/dL — ABNORMAL HIGH (ref 70–99)

## 2023-07-02 LAB — CSF CELL COUNT WITH DIFFERENTIAL
Lymphs, CSF: 91 % — ABNORMAL HIGH (ref 40–80)
Lymphs, CSF: 98 % — ABNORMAL HIGH (ref 40–80)
Monocyte-Macrophage-Spinal Fluid: 6 % — ABNORMAL LOW (ref 15–45)
RBC Count, CSF: 175 /mm3 — ABNORMAL HIGH
RBC Count, CSF: 3000 /mm3 — ABNORMAL HIGH
Segmented Neutrophils-CSF: 2 % (ref 0–6)
Segmented Neutrophils-CSF: 3 % (ref 0–6)
Tube #: 1
Tube #: 4
WBC, CSF: 260 /mm3 (ref 0–5)
WBC, CSF: 380 /mm3 (ref 0–5)

## 2023-07-02 LAB — CBG MONITORING, ED
Glucose-Capillary: 181 mg/dL — ABNORMAL HIGH (ref 70–99)
Glucose-Capillary: 93 mg/dL (ref 70–99)
Glucose-Capillary: 149 mg/dL — ABNORMAL HIGH (ref 70–99)

## 2023-07-02 LAB — CBC WITH DIFFERENTIAL/PLATELET
Abs Immature Granulocytes: 0.06 10*3/uL (ref 0.00–0.07)
Basophils Absolute: 0.1 10*3/uL (ref 0.0–0.1)
Basophils Relative: 1 %
Eosinophils Absolute: 0 10*3/uL (ref 0.0–0.5)
Eosinophils Relative: 0 %
HCT: 43.7 % (ref 36.0–46.0)
Hemoglobin: 14.2 g/dL (ref 12.0–15.0)
Immature Granulocytes: 0 %
Lymphocytes Relative: 13 %
Lymphs Abs: 2.3 10*3/uL (ref 0.7–4.0)
MCH: 29.3 pg (ref 26.0–34.0)
MCHC: 32.5 g/dL (ref 30.0–36.0)
MCV: 90.3 fL (ref 80.0–100.0)
Monocytes Absolute: 1.2 10*3/uL — ABNORMAL HIGH (ref 0.1–1.0)
Monocytes Relative: 7 %
Neutro Abs: 13.5 10*3/uL — ABNORMAL HIGH (ref 1.7–7.7)
Neutrophils Relative %: 79 %
Platelets: 218 10*3/uL (ref 150–400)
RBC: 4.84 MIL/uL (ref 3.87–5.11)
RDW: 14.2 % (ref 11.5–15.5)
WBC: 17.1 10*3/uL — ABNORMAL HIGH (ref 4.0–10.5)
nRBC: 0 % (ref 0.0–0.2)

## 2023-07-02 LAB — HIV ANTIBODY (ROUTINE TESTING W REFLEX): HIV Screen 4th Generation wRfx: NONREACTIVE

## 2023-07-02 LAB — PROTEIN AND GLUCOSE, CSF
Glucose, CSF: 69 mg/dL (ref 40–70)
Total  Protein, CSF: 191 mg/dL — ABNORMAL HIGH (ref 15–45)

## 2023-07-02 LAB — I-STAT CG4 LACTIC ACID, ED
Lactic Acid, Venous: 3 mmol/L (ref 0.5–1.9)
Lactic Acid, Venous: 1.9 mmol/L (ref 0.5–1.9)

## 2023-07-02 LAB — MENINGITIS/ENCEPHALITIS PANEL (CSF)
Cryptococcus neoformans/gattii (CSF): NOT DETECTED
Cytomegalovirus (CSF): NOT DETECTED
Enterovirus (CSF): NOT DETECTED
Escherichia coli K1 (CSF): NOT DETECTED
Haemophilus influenzae (CSF): NOT DETECTED
Herpes simplex virus 1 (CSF): NOT DETECTED
Herpes simplex virus 2 (CSF): DETECTED — AB
Human herpesvirus 6 (CSF): NOT DETECTED
Human parechovirus (CSF): NOT DETECTED
Listeria monocytogenes (CSF): NOT DETECTED
Neisseria meningitis (CSF): NOT DETECTED
Streptococcus agalactiae (CSF): NOT DETECTED
Streptococcus pneumoniae (CSF): NOT DETECTED
Varicella zoster virus (CSF): NOT DETECTED

## 2023-07-02 LAB — BASIC METABOLIC PANEL
Anion gap: 12 (ref 5–15)
BUN: 10 mg/dL (ref 8–23)
CO2: 22 mmol/L (ref 22–32)
Calcium: 8.8 mg/dL — ABNORMAL LOW (ref 8.9–10.3)
Chloride: 101 mmol/L (ref 98–111)
Creatinine, Ser: 0.78 mg/dL (ref 0.44–1.00)
GFR, Estimated: >60 mL/min (ref >60)
Glucose, Bld: 178 mg/dL — ABNORMAL HIGH (ref 70–99)
Potassium: 2.6 mmol/L — CL (ref 3.5–5.1)
Sodium: 135 mmol/L (ref 135–145)

## 2023-07-02 LAB — RPR: RPR Ser Ql: NONREACTIVE

## 2023-07-02 LAB — TROPONIN I (HIGH SENSITIVITY)
Troponin I (High Sensitivity): 7 ng/L (ref <18)
Troponin I (High Sensitivity): 8 ng/L (ref <18)

## 2023-07-02 LAB — AMMONIA: Ammonia: 23 umol/L (ref 9–35)

## 2023-07-02 LAB — ETHANOL: Alcohol, Ethyl (B): <10 mg/dL (ref <10)

## 2023-07-02 LAB — VITAMIN B12: Vitamin B-12: 835 pg/mL (ref 180–914)

## 2023-07-02 LAB — MAGNESIUM: Magnesium: 1.9 mg/dL (ref 1.7–2.4)

## 2023-07-02 LAB — TSH: TSH: 1.232 u[IU]/mL (ref 0.350–4.500)

## 2023-07-02 LAB — LACTIC ACID, PLASMA: Lactic Acid, Venous: 0.9 mmol/L (ref 0.5–1.9)

## 2023-07-02 LAB — SARS CORONAVIRUS 2 BY RT PCR: SARS Coronavirus 2 by RT PCR: NEGATIVE

## 2023-07-02 LAB — CK: Total CK: 309 U/L — ABNORMAL HIGH (ref 38–234)

## 2023-07-02 MED ORDER — ENOXAPARIN SODIUM 40 MG/0.4ML IJ SOSY: 40.0000 mg | PREFILLED_SYRINGE | INTRAMUSCULAR | Status: DC

## 2023-07-02 MED ORDER — SODIUM CHLORIDE 0.9 % IV SOLN
2.0000 g | Freq: Two times a day (BID) | INTRAVENOUS | Status: DC
  Administered 2023-07-02 (×2): 2 g via INTRAVENOUS
  Filled 2023-07-02: qty 20

## 2023-07-02 MED ORDER — SODIUM CHLORIDE 0.9 % IV SOLN
INTRAVENOUS | Status: AC
  Administered 2023-07-02: 1000 mL via INTRAVENOUS

## 2023-07-02 MED ORDER — DEXTROSE 5 % IV SOLN
10.0000 mg/kg | Freq: Three times a day (TID) | INTRAVENOUS | Status: DC
  Administered 2023-07-02 – 2023-07-04 (×5): 820 mg via INTRAVENOUS
  Filled 2023-07-02 (×7): qty 16.4

## 2023-07-02 MED ORDER — ACETAMINOPHEN 325 MG PO TABS
650.0000 mg | ORAL_TABLET | Freq: Four times a day (QID) | ORAL | Status: DC | PRN
  Administered 2023-07-02 – 2023-07-07 (×3): 650 mg via ORAL
  Filled 2023-07-02 (×3): qty 2

## 2023-07-02 MED ORDER — VANCOMYCIN HCL IN DEXTROSE 1-5 GM/200ML-% IV SOLN: 1000.0000 mg | Freq: Once | INTRAVENOUS | Status: DC

## 2023-07-02 MED ORDER — INSULIN ASPART 100 UNIT/ML IJ SOLN
0.0000 [IU] | INTRAMUSCULAR | Status: DC
  Administered 2023-07-02 – 2023-07-05 (×2): 1 [IU] via SUBCUTANEOUS
  Administered 2023-07-06: 2 [IU] via SUBCUTANEOUS

## 2023-07-02 MED ORDER — VANCOMYCIN HCL IN DEXTROSE 1-5 GM/200ML-% IV SOLN
1000.0000 mg | Freq: Once | INTRAVENOUS | Status: DC
  Administered 2023-07-02: 1000 mg via INTRAVENOUS
  Filled 2023-07-02: qty 200

## 2023-07-02 MED ORDER — POLYETHYLENE GLYCOL 3350 17 G PO PACK: 17.0000 g | PACK | Freq: Every day | ORAL | Status: DC | PRN

## 2023-07-02 MED ORDER — HYDROMORPHONE HCL 1 MG/ML IJ SOLN
0.5000 mg | INTRAMUSCULAR | Status: DC | PRN
  Administered 2023-07-02 – 2023-07-04 (×6): 1 mg via INTRAVENOUS
  Administered 2023-07-04: 0.5 mg via INTRAVENOUS
  Administered 2023-07-04 – 2023-07-08 (×12): 1 mg via INTRAVENOUS
  Filled 2023-07-02 (×20): qty 1

## 2023-07-02 MED ORDER — VANCOMYCIN HCL 1500 MG/300ML IV SOLN
1500.0000 mg | Freq: Two times a day (BID) | INTRAVENOUS | Status: DC
  Filled 2023-07-02: qty 300

## 2023-07-02 MED ORDER — SODIUM CHLORIDE 0.9% FLUSH
3.0000 mL | Freq: Two times a day (BID) | INTRAVENOUS | Status: DC
  Administered 2023-07-02 – 2023-07-08 (×13): 3 mL via INTRAVENOUS

## 2023-07-02 MED ORDER — SODIUM CHLORIDE 0.9 % IV SOLN: INTRAVENOUS | Status: DC

## 2023-07-02 MED ORDER — VANCOMYCIN HCL IN DEXTROSE 1-5 GM/200ML-% IV SOLN
1000.0000 mg | Freq: Once | INTRAVENOUS | Status: AC
  Administered 2023-07-02: 1000 mg via INTRAVENOUS

## 2023-07-02 MED ORDER — SODIUM CHLORIDE 0.9 % IV BOLUS
1000.0000 mL | Freq: Once | INTRAVENOUS | Status: AC
  Administered 2023-07-02: 1000 mL via INTRAVENOUS

## 2023-07-02 MED ORDER — ENOXAPARIN SODIUM 40 MG/0.4ML IJ SOSY
40.0000 mg | PREFILLED_SYRINGE | INTRAMUSCULAR | Status: DC
  Administered 2023-07-02 – 2023-07-08 (×6): 40 mg via SUBCUTANEOUS
  Filled 2023-07-02 (×6): qty 0.4

## 2023-07-02 MED ORDER — LACTATED RINGERS IV BOLUS (SEPSIS)
1000.0000 mL | Freq: Once | INTRAVENOUS | Status: AC
  Administered 2023-07-02: 1000 mL via INTRAVENOUS

## 2023-07-02 MED ORDER — OXYCODONE HCL 5 MG PO TABS
5.0000 mg | ORAL_TABLET | ORAL | Status: DC | PRN
  Administered 2023-07-02 – 2023-07-07 (×9): 5 mg via ORAL
  Filled 2023-07-02 (×9): qty 1

## 2023-07-02 MED ORDER — IOHEXOL 350 MG/ML SOLN
75.0000 mL | Freq: Once | INTRAVENOUS | Status: AC | PRN
  Administered 2023-07-02: 75 mL via INTRAVENOUS

## 2023-07-02 MED ORDER — VANCOMYCIN HCL 500 MG/100ML IV SOLN
500.0000 mg | Freq: Once | INTRAVENOUS | Status: DC
  Filled 2023-07-02: qty 100

## 2023-07-02 MED ORDER — LORAZEPAM 2 MG/ML IJ SOLN
1.0000 mg | Freq: Once | INTRAMUSCULAR | Status: AC
  Administered 2023-07-02: 1 mg via INTRAVENOUS
  Filled 2023-07-02: qty 1

## 2023-07-02 MED ORDER — SODIUM CHLORIDE 0.9 % IV SOLN
2.0000 g | INTRAVENOUS | Status: DC
  Administered 2023-07-02 (×4): 2 g via INTRAVENOUS
  Filled 2023-07-02 (×6): qty 2000

## 2023-07-02 MED ORDER — PROCHLORPERAZINE EDISYLATE 10 MG/2ML IJ SOLN
5.0000 mg | Freq: Four times a day (QID) | INTRAMUSCULAR | Status: DC | PRN
  Administered 2023-07-04: 5 mg via INTRAVENOUS
  Filled 2023-07-02: qty 2

## 2023-07-02 MED ORDER — DEXTROSE 5 % IV SOLN
10.0000 mg/kg | Freq: Three times a day (TID) | INTRAVENOUS | Status: DC
  Administered 2023-07-02: 1075 mg via INTRAVENOUS
  Filled 2023-07-02 (×2): qty 21.5
  Filled 2023-07-02: qty 20

## 2023-07-02 MED ORDER — LABETALOL HCL 5 MG/ML IV SOLN
10.0000 mg | INTRAVENOUS | Status: DC | PRN
Start: 2023-07-02 — End: 2023-07-08

## 2023-07-02 MED ORDER — POTASSIUM CHLORIDE 10 MEQ/100ML IV SOLN
10.0000 meq | INTRAVENOUS | Status: AC
  Administered 2023-07-02 (×4): 10 meq via INTRAVENOUS
  Filled 2023-07-02 (×4): qty 100

## 2023-07-02 MED ORDER — LIDOCAINE HCL (PF) 1 % IJ SOLN
5.0000 mL | Freq: Once | INTRAMUSCULAR | Status: AC
  Administered 2023-07-02: 5 mL
  Filled 2023-07-02: qty 5

## 2023-07-02 MED ORDER — ACETAMINOPHEN 650 MG RE SUPP: 650.0000 mg | Freq: Four times a day (QID) | RECTAL | Status: DC | PRN

## 2023-07-02 NOTE — Progress Notes (Signed)
  Progress Note   Patient: Margaret [name] XBM:[id number] DOB: Jan 11, 1961 DOA: 07/01/2023     0 DOS: the patient was seen and examined on 07/02/2023   Brief hospital course: No notes on file  Assessment and Plan:  62 year old female with history of hypertension, diabetes mellitus, depression, BMI of 36, breast cancer s/p bilateral mastectomy presents to the emergency department with complaints of headache, confusion.  The patient had upper respiratory tract infection for the last 2 days associated with the nausea, vomiting, confusion since yesterday.  Workup in the emergency department patient was afebrile, tachycardia, tachypnea, elevated blood pressure.  EKG showed sinus tachycardia.  Chest x-ray negative for acute cardiopulmonary disease.  CT head without contrast did not show any acute intracranial abnormality.  Patient was found to have elevated white blood cell count of 17,000 with a lactic acid of 3.  The patient was evaluated by neurology in the emergency department.  Lumbar puncture performed and CSF WBC count of 260 with lymphocyte predominant of 98%, glucose 69, protein 191.  Patient was given vancomycin, Rocephin, ampicillin, acyclovir.  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  Type 2 diabetes mellitus -Patient's hemoglobin A1c 7 in August 2024 -Follow-up on sliding scale insulin.  Hypertension -Closely follow-up.  Hypokalemia -Replaced by IV  Morbid obesity -Patient has a BMI of 35 -Will need counseling done regarding diet and exercise once patient is stable   Subjective: pt is confused  Physical Exam: Vitals:   07/02/23 0522 07/02/23 0600 07/02/23 0730 07/02/23 0745  BP:  (!)  153/93 (!) 132/92   Pulse:  96 94   Resp: 20 19 20    Temp:    98.1 F (36.7 C)  TempSrc:    Oral  SpO2:  98% 98%   Weight:      Height:         Morbidly obese female laying down in the bed, confused Head normocephalic atraumatic eyes no scleral icterus Lungs bilateral clear to auscultation Heart S1-S2 regular tachycardia Abdomen bowel sounds plus soft nontender nondistended Extremities no pedal edema Neuro not oriented to place person and time Data Reviewed: { Reviewed independently  Family Communication:  daughter at bedside  Disposition: Status is: Inpatient Remains inpatient appropriate because:  due to encephalitis HSV  Planned Discharge Destination:  pending clinical improvement  Time spent:  40 minutes  Author: Susa Griffins, MD 07/02/2023 8:57 AM  For on call review [email or web address].

## 2023-07-02 NOTE — ED Notes (Signed)
ED TO INPATIENT HANDOFF REPORT  ED Nurse Name and Phone #: Topher [phone]  S Name/Age/Gender Margaret [name] 62 y.o. female Room/Bed: 046C/046C  Code Status   Code Status: Full Code  Home/SNF/Other Home Patient oriented to: self and time Is this baseline? No   Triage Complete: Triage complete  Chief Complaint Acute encephalopathy [G93.40]  Triage Note Pt BIB EMS from home, reports "cold like symptoms" for the past couple days. Today started hyperventilating and repeating statements over and over. Unable to tell this RN why she is in the hospital, states "sorry sorry sorry" while attempting to ask orientation questions. States that her family should be here to tell why she is here in the hospital. EMS reports that pt takes Effexor at home.    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  May admit patient to [facility] or [facility] if equivalent level of care is available:: No  Covid Evaluation: Asymptomatic - no recent exposure (last 10 days) testing not required  Diagnosis: Acute encephalopathy [086578]  Admitting Physician: Briscoe Deutscher [[id number]]  Attending Physician: Briscoe Deutscher [[id number]]  Certification:: I certify this patient will need inpatient services for at least 2 midnights  Expected Medical Readiness: 07/04/2023           B Medical/Surgery History Past Medical History:  Diagnosis Date   Allergy    Anxiety    Arthritis    Bilateral breast cancer (HCC)    Breast cancer (HCC)    Breast cancer of upper-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) 04/21/2015   Depression    Diabetes mellitus without complication (HCC)    GERD (gastroesophageal reflux disease)    Hypertension    Past Surgical History:  Procedure Laterality Date   ABDOMINAL HYSTERECTOMY  1990   MASTECTOMY MODIFIED RADICAL Left 05/09/2015   MASTECTOMY MODIFIED RADICAL Left 05/09/2015   Procedure: LEFT MODIFIED RADICAL MASTETCTOMY;  Surgeon: Chevis Pretty III, MD;  Location: [facility] OR;  Service: General;  Laterality: Left;   MASTECTOMY W/ SENTINEL NODE BIOPSY Right    MASTECTOMY W/ SENTINEL NODE BIOPSY Right 05/09/2015   Procedure: RIGHT MASTECTOMY WITH RIGHT AXILLARY SENTINEL LYMPH NODE BIOPSY;  Surgeon: Chevis Pretty III, MD;  Location: [facility] OR;  Service: General;  Laterality: Right;   PORTACATH PLACEMENT Right 09/01/2015   Procedure: INSERTION PORT-A-CATH;  Surgeon: Chevis Pretty III, MD;  Location: [facility];  Service: General;  Laterality: Right;   TUBAL LIGATION  1984     A IV Location/Drains/Wounds Patient Lines/Drains/Airways Status     Active Line/Drains/Airways     Name Placement date Placement time Site Days   Implanted Port 09/01/15 Right Chest 09/01/15  0914  Chest  2861   Peripheral IV 07/02/23 20 G 1" Posterior;Right Hand 07/02/23  0600  Hand  less than 1   Peripheral IV 07/02/23 Anterior;Proximal;Right Forearm 07/02/23  1203  Forearm  less than 1   Peripheral IV 07/02/23 20 G Left;Posterior Hand 07/02/23  1207  Hand  less than 1            Intake/Output Last 24 hours  Intake/Output Summary (Last 24 hours) at 07/02/2023 1320 Last data filed at 07/02/2023 0605 Gross per 24 hour  Intake 3271.4 ml  Output --  Net 3271.4 ml    Labs/Imaging Results for orders placed or performed during the hospital  encounter of 07/01/23 (from the past 48 hour(s))  SARS Coronavirus 2 by RT PCR (hospital order, performed in [facility] hospital lab) *cepheid single result test* Anterior Nasal Swab     Status: None   Collection Time: 07/01/23 11:30 PM   Specimen: Anterior Nasal Swab  Result Value Ref Range   SARS Coronavirus 2 by RT PCR NEGATIVE NEGATIVE    Comment: Performed at [facility] Lab, [address], [place], [place] [address]  Comprehensive metabolic panel     Status: Abnormal   Collection Time: 07/01/23 11:30 PM  Result Value Ref Range   Sodium 134 (L) 135 - 145 mmol/L   Potassium 3.2 (L) 3.5 - 5.1 mmol/L   Chloride 100 98 - 111 mmol/L   CO2 21 (L) 22 - 32 mmol/L   Glucose, Bld 156 (H) 70 - 99 mg/dL    Comment: Glucose reference range applies only to samples taken after fasting for at least 8 hours.   BUN 15 8 - 23 mg/dL   Creatinine, Ser 0.86 0.44 - 1.00 mg/dL   Calcium 9.6 8.9 - 57.8 mg/dL   Total Protein 8.4 (H) 6.5 - 8.1 g/dL   Albumin 4.1 3.5 - 5.0 g/dL   AST 25 15 - 41 U/L   ALT 23 0 - 44 U/L   Alkaline Phosphatase 77 38 - 126 U/L   Total Bilirubin 0.7 <1.2 mg/dL   GFR, Estimated >46 >96 mL/min    Comment: (NOTE) Calculated using the CKD-EPI Creatinine Equation (2021)    Anion gap 13 5 - 15    Comment: Performed at [facility], [address], [place], [place] [address]  Ethanol     Status: None   Collection Time: 07/01/23 11:30 PM  Result Value Ref Range   Alcohol, Ethyl (B) <10 <10 mg/dL    Comment: (NOTE) Lowest detectable limit for serum alcohol is 10 mg/dL.  For medical purposes only. Performed at [facility] Lab, [address], [place], [place] [address]   CBC with Differential     Status: Abnormal   Collection Time: 07/01/23 11:30 PM  Result Value Ref Range   WBC 17.8 (H) 4.0 - 10.5 K/uL   RBC 5.34 (H) 3.87 - 5.11 MIL/uL   Hemoglobin 15.6 (H) 12.0 - 15.0 g/dL   HCT 40.1 (H) 02.7 - 25.3 %   MCV 91.4 80.0 - 100.0 fL   MCH 29.2 26.0 - 34.0 pg    MCHC 32.0 30.0 - 36.0 g/dL   RDW 66.4 40.3 - 47.4 %   Platelets 211 150 - 400 K/uL   nRBC 0.0 0.0 - 0.2 %   Neutrophils Relative % 83 %   Neutro Abs 14.8 (H) 1.7 - 7.7 K/uL   Lymphocytes Relative 11 %   Lymphs Abs 2.0 0.7 - 4.0 K/uL   Monocytes Relative 5 %   Monocytes Absolute 0.8 0.1 - 1.0 K/uL  Eosinophils Relative 0 %   Eosinophils Absolute 0.0 0.0 - 0.5 K/uL   Basophils Relative 1 %   Basophils Absolute 0.1 0.0 - 0.1 K/uL   Immature Granulocytes 0 %   Abs Immature Granulocytes 0.07 0.00 - 0.07 K/uL    Comment: Performed at [facility], [address] [place], [place] [address]  Troponin I (High Sensitivity)     Status: None   Collection Time: 07/01/23 11:30 PM  Result Value Ref Range   Troponin I (High Sensitivity) 8 <18 ng/L    Comment: (NOTE) Elevated high sensitivity troponin I (hsTnI) values and significant  changes across serial measurements may suggest ACS but many other  chronic and acute conditions are known to elevate hsTnI results.  Refer to the "Links" section for chest pain algorithms and additional  guidance. Performed at [facility], [address], [place], [place] [address]   CBG monitoring, ED     Status: Abnormal   Collection Time: 07/01/23 11:32 PM  Result Value Ref Range   Glucose-Capillary 164 (H) 70 - 99 mg/dL    Comment: Glucose reference range applies only to samples taken after fasting for at least 8 hours.  Urinalysis, w/ Reflex to Culture (Infection Suspected) -Urine, Catheterized     Status: Abnormal   Collection Time: 07/02/23 12:34 AM  Result Value Ref Range   Specimen Source URINE, CATHETERIZED    Color, Urine YELLOW YELLOW   APPearance CLEAR CLEAR   Specific Gravity, Urine >1.046 (H) 1.005 - 1.030   pH 6.0 5.0 - 8.0   Glucose, UA NEGATIVE NEGATIVE mg/dL   Hgb urine dipstick NEGATIVE NEGATIVE   Bilirubin Urine NEGATIVE NEGATIVE   Ketones, ur 20 (A) NEGATIVE mg/dL   Protein, ur 30 (A) NEGATIVE mg/dL   Nitrite  NEGATIVE NEGATIVE   Leukocytes,Ua NEGATIVE NEGATIVE   RBC / HPF 0-5 0 - 5 RBC/hpf   WBC, UA 0-5 0 - 5 WBC/hpf    Comment:        Reflex urine culture not performed if WBC <=10, OR if Squamous epithelial cells >5. If Squamous epithelial cells >5 suggest recollection.    Bacteria, UA NONE SEEN NONE SEEN   Squamous Epithelial / HPF 0-5 0 - 5 /HPF   Mucus PRESENT     Comment: Performed at [facility] Lab, [address], [place], [place] [address]  Urine rapid drug screen (hosp performed)     Status: Abnormal   Collection Time: 07/02/23 12:34 AM  Result Value Ref Range   Opiates NONE DETECTED NONE DETECTED   Cocaine NONE DETECTED NONE DETECTED   Benzodiazepines NONE DETECTED NONE DETECTED   Amphetamines NONE DETECTED NONE DETECTED   Tetrahydrocannabinol POSITIVE (A) NONE DETECTED   Barbiturates NONE DETECTED NONE DETECTED    Comment: (NOTE) DRUG SCREEN FOR MEDICAL PURPOSES ONLY.  IF CONFIRMATION IS NEEDED FOR ANY PURPOSE, NOTIFY LAB WITHIN 5 DAYS.  LOWEST DETECTABLE LIMITS FOR URINE DRUG SCREEN Drug Class                     Cutoff (ng/mL) Amphetamine and metabolites    1000 Barbiturate and metabolites    200 Benzodiazepine                 200 Opiates and metabolites        300 Cocaine and metabolites        300 THC  50 Performed at [facility], [address] [place], [place] [address]   CBC with Differential/Platelet     Status: Abnormal   Collection Time: 07/02/23 12:45 AM  Result Value Ref Range   WBC 17.1 (H) 4.0 - 10.5 K/uL   RBC 4.84 3.87 - 5.11 MIL/uL   Hemoglobin 14.2 12.0 - 15.0 g/dL   HCT 60.4 54.0 - 98.1 %   MCV 90.3 80.0 - 100.0 fL   MCH 29.3 26.0 - 34.0 pg   MCHC 32.5 30.0 - 36.0 g/dL   RDW 19.1 47.8 - 29.5 %   Platelets 218 150 - 400 K/uL   nRBC 0.0 0.0 - 0.2 %   Neutrophils Relative % 79 %   Neutro Abs 13.5 (H) 1.7 - 7.7 K/uL   Lymphocytes Relative 13 %   Lymphs Abs 2.3 0.7 - 4.0 K/uL   Monocytes Relative 7 %    Monocytes Absolute 1.2 (H) 0.1 - 1.0 K/uL   Eosinophils Relative 0 %   Eosinophils Absolute 0.0 0.0 - 0.5 K/uL   Basophils Relative 1 %   Basophils Absolute 0.1 0.0 - 0.1 K/uL   Immature Granulocytes 0 %   Abs Immature Granulocytes 0.06 0.00 - 0.07 K/uL    Comment: Performed at [facility], [address] [place], [place] [address]  HIV Antibody (routine testing w rflx)     Status: None   Collection Time: 07/02/23 12:45 AM  Result Value Ref Range   HIV Screen 4th Generation wRfx Non Reactive Non Reactive    Comment: Performed at [facility] Lab, [address], [place], [place] [address]  Magnesium     Status: None   Collection Time: 07/02/23 12:45 AM  Result Value Ref Range   Magnesium 1.9 1.7 - 2.4 mg/dL    Comment: Performed at [facility] Lab, [address], [place], [place] [address]  CK     Status: Abnormal   Collection Time: 07/02/23 12:45 AM  Result Value Ref Range   Total CK 309 (H) 38 - 234 U/L    Comment: Performed at [facility], [address], [place], [place] [address]  Comprehensive metabolic panel     Status: Abnormal   Collection Time: 07/02/23 12:46 AM  Result Value Ref Range   Sodium 134 (L) 135 - 145 mmol/L   Potassium 3.1 (L) 3.5 - 5.1 mmol/L   Chloride 100 98 - 111 mmol/L   CO2 22 22 - 32 mmol/L   Glucose, Bld 131 (H) 70 - 99 mg/dL    Comment: Glucose reference range applies only to samples taken after fasting for at least 8 hours.   BUN 14 8 - 23 mg/dL   Creatinine, Ser 1.32 0.44 - 1.00 mg/dL   Calcium 9.6 8.9 - 44.0 mg/dL   Total Protein 7.4 6.5 - 8.1 g/dL   Albumin 3.8 3.5 - 5.0 g/dL   AST 23 15 - 41 U/L   ALT 22 0 - 44 U/L   Alkaline Phosphatase 74 38 - 126 U/L   Total Bilirubin 0.6 <1.2 mg/dL   GFR, Estimated >10 >27 mL/min    Comment: (NOTE) Calculated using the CKD-EPI Creatinine Equation (2021)    Anion gap 12 5 - 15    Comment: Performed at [facility] Lab, [address], [place], [place] [address]  RPR      Status: None   Collection Time: 07/02/23 12:46 AM  Result Value Ref Range  RPR Ser Ql NON REACTIVE NON REACTIVE    Comment: Performed at [facility] Lab, [address], [place], [place] [address]  TSH     Status: None   Collection Time: 07/02/23 12:46 AM  Result Value Ref Range   TSH 1.232 0.350 - 4.500 uIU/mL    Comment: Performed by a 3rd Generation assay with a functional sensitivity of <=0.01 uIU/mL. Performed at [facility], [address] [place], [place] [address]   Ammonia     Status: None   Collection Time: 07/02/23 12:46 AM  Result Value Ref Range   Ammonia 23 9 - 35 umol/L    Comment: HEMOLYSIS AT THIS LEVEL MAY AFFECT RESULT Performed at [facility], [address] [place], [place] [address]   I-Stat Lactic Acid     Status: Abnormal   Collection Time: 07/02/23 12:52 AM  Result Value Ref Range   Lactic Acid, Venous 3.0 (HH) 0.5 - 1.9 mmol/L   Comment NOTIFIED PHYSICIAN   Vitamin B12     Status: None   Collection Time: 07/02/23 12:55 AM  Result Value Ref Range   Vitamin B-12 835 180 - 914 pg/mL    Comment: HEMOLYSIS AT THIS LEVEL MAY AFFECT RESULT (NOTE) This assay is not validated for testing neonatal or myeloproliferative syndrome specimens for Vitamin B12 levels. Performed at [facility], [address], [place], [place] [address]   CSF cell count with differential collection tube #: 1     Status: Abnormal   Collection Time: 07/02/23  1:33 AM  Result Value Ref Range   Tube # 1    Color, CSF STRAW (A) COLORLESS   Appearance, CSF HAZY (A) CLEAR   Supernatant COLORLESS    RBC Count, CSF 3,000 (H) 0 /cu mm   WBC, CSF 380 (HH) 0 - 5 /cu mm    Comment: CRITICAL RESULT CALLED TO, READ BACK BY AND VERIFIED WITH: MOBESTRY FIRLEY RN 07/02/23 0415 SGALLOWAY    Segmented Neutrophils-CSF 3 0 - 6 %   Lymphs, CSF 91 (H) 40 - 80 %   Monocyte-Macrophage-Spinal Fluid 6 (L) 15 - 45 %    Comment: Performed at [facility], [address], [place], [place] [address]  CSF cell count with differential collection tube #: 4     Status: Abnormal   Collection Time: 07/02/23  1:33 AM  Result Value Ref Range   Tube # 4    Color, CSF COLORLESS COLORLESS   Appearance, CSF CLEAR (A) CLEAR   Supernatant NOT INDICATED    RBC Count, CSF 175 (H) 0 /cu mm   WBC, CSF 260 (HH) 0 - 5 /cu mm    Comment: CRITICAL RESULT CALLED TO, READ BACK BY AND VERIFIED WITH: MOBESTRY FIRLEY RN 07/02/23 0415 SGALLOWAY    Segmented Neutrophils-CSF 2 0 - 6 %   Lymphs, CSF 98 (H) 40 - 80 %    Comment: Performed at [facility] Lab, [address] [place], [place] [address]  CSF culture w Gram Stain     Status: None (Preliminary result)   Collection Time: 07/02/23  1:33 AM   Specimen: CSF; Cerebrospinal Fluid  Result Value Ref Range   Specimen Description CSF    Special Requests LP    Gram Stain      CYTOSPIN SMEAR WBC PRESENT, PREDOMINANTLY MONONUCLEAR NO ORGANISMS SEEN Performed at [facility], [address], [place], [place] [address]  Culture PENDING    Report Status PENDING   Protein and glucose, CSF     Status: Abnormal   Collection Time: 07/02/23  1:33 AM  Result Value Ref Range   Glucose, CSF 69 40 - 70 mg/dL   Total  Protein, CSF 604 (H) 15 - 45 mg/dL    Comment: RESULT CONFIRMED BY MANUAL DILUTION Performed at [facility], [address] [place], [place] [address]   Meningitis/Encephalitis Panel (CSF)     Status: Abnormal   Collection Time: 07/02/23  1:33 AM  Result Value Ref Range   Cryptococcus neoformans/gattii (CSF) NOT DETECTED NOT DETECTED    Comment: (NOTE) Patients with a suspicion of cryptococcal meningitis should be tested  for cryptococcal antigen (CrAg).      Cytomegalovirus (CSF) NOT DETECTED NOT DETECTED   Enterovirus (CSF) NOT DETECTED NOT DETECTED   Escherichia coli K1 (CSF) NOT DETECTED NOT DETECTED    Comment: (NOTE) Only E. coli strains possessing the K1 capsular antigen will be detected.       Haemophilus influenzae (CSF) NOT DETECTED NOT DETECTED   Herpes simplex virus 1 (CSF) NOT DETECTED NOT DETECTED   Herpes simplex virus 2 (CSF) DETECTED (A) NOT DETECTED    Comment: CRITICAL RESULT CALLED TO, READ BACK BY AND VERIFIED WITH: J GLOSTER,RN@[id number]  07/02/23 MK (NOTE) False detection may occur due to reactivation of latent virus from previous infection and should only be considered within appropriate clinical context.      Human herpesvirus 6 (CSF) NOT DETECTED NOT DETECTED   Human parechovirus (CSF) NOT DETECTED NOT DETECTED   Listeria monocytogenes (CSF) NOT DETECTED NOT DETECTED   Neisseria meningitis (CSF) NOT DETECTED NOT DETECTED    Comment: (NOTE) Only encapsulated strains of N. meningitidis will be detected.     Streptococcus agalactiae (CSF) NOT DETECTED NOT DETECTED   Streptococcus pneumoniae (CSF) NOT DETECTED NOT DETECTED   Varicella zoster virus (CSF) NOT DETECTED NOT DETECTED    Comment: Performed at [facility] Lab, [address], [place], [place] [address]  Troponin I (High Sensitivity)     Status: None   Collection Time: 07/02/23  1:33 AM  Result Value Ref Range   Troponin I (High Sensitivity) 7 <18 ng/L    Comment: (NOTE) Elevated high sensitivity troponin I (hsTnI) values and significant  changes across serial measurements may suggest ACS but many other  chronic and acute conditions are known to elevate hsTnI results.  Refer to the "Links" section for chest pain algorithms and additional  guidance. Performed at [facility], [address], [place], [place] [address]   Basic metabolic panel     Status: Abnormal   Collection Time: 07/02/23  3:24 AM  Result Value Ref Range   Sodium 135 135 - 145 mmol/L   Potassium 2.6 (LL) 3.5 - 5.1 mmol/L    Comment: CRITICAL RESULT CALLED TO, READ BACK BY AND VERIFIED WITH Cruzita Lederer RN @ [phone] 07/02/23 JBUTLER   Chloride 101 98 - 111 mmol/L   CO2 22 22 - 32 mmol/L   Glucose, Bld 178 (H) 70 - 99  mg/dL    Comment: Glucose reference range applies only to samples taken after fasting for at least 8 hours.   BUN 10 8 - 23 mg/dL   Creatinine, Ser 0.86 0.44 - 1.00 mg/dL   Calcium 8.8 (L) 8.9 - 10.3 mg/dL   GFR, Estimated >57 >84 mL/min    Comment: (NOTE) Calculated using the CKD-EPI Creatinine Equation (  2021)    Anion gap 12 5 - 15    Comment: Performed at [facility] Lab, [address] [place], [place] [address]  CBC     Status: Abnormal   Collection Time: 07/02/23  3:24 AM  Result Value Ref Range   WBC 15.7 (H) 4.0 - 10.5 K/uL   RBC 4.77 3.87 - 5.11 MIL/uL   Hemoglobin 14.0 12.0 - 15.0 g/dL   HCT 60.4 54.0 - 98.1 %   MCV 90.4 80.0 - 100.0 fL   MCH 29.4 26.0 - 34.0 pg   MCHC 32.5 30.0 - 36.0 g/dL   RDW 19.1 47.8 - 29.5 %   Platelets 191 150 - 400 K/uL   nRBC 0.0 0.0 - 0.2 %    Comment: Performed at [facility] Lab, [address] [place], [place] [address]  Lactic acid, plasma     Status: None   Collection Time: 07/02/23  3:24 AM  Result Value Ref Range   Lactic Acid, Venous 0.9 0.5 - 1.9 mmol/L    Comment: Performed at [facility] Lab, [address], [place], [place] [address]  I-Stat Lactic Acid     Status: None   Collection Time: 07/02/23  3:42 AM  Result Value Ref Range   Lactic Acid, Venous 1.9 0.5 - 1.9 mmol/L  CBG monitoring, ED     Status: Abnormal   Collection Time: 07/02/23  4:48 AM  Result Value Ref Range   Glucose-Capillary 181 (H) 70 - 99 mg/dL    Comment: Glucose reference range applies only to samples taken after fasting for at least 8 hours.  CBG monitoring, ED     Status: None   Collection Time: 07/02/23  7:36 AM  Result Value Ref Range   Glucose-Capillary 93 70 - 99 mg/dL    Comment: Glucose reference range applies only to samples taken after fasting for at least 8 hours.   Comment 1 Document in Chart   CBG monitoring, ED     Status: Abnormal   Collection Time: 07/02/23 12:09 PM  Result Value Ref Range   Glucose-Capillary 149 (H) 70 -  99 mg/dL    Comment: Glucose reference range applies only to samples taken after fasting for at least 8 hours.   MR BRAIN WO CONTRAST  Result Date: 07/02/2023 CLINICAL DATA:  Headache, fever.  Meningitis/CNS infection suspected EXAM: MRI HEAD WITHOUT CONTRAST TECHNIQUE: Multiplanar, multiecho pulse sequences of the brain and surrounding structures were obtained without intravenous contrast. COMPARISON:  CTA of the head neck from earlier today. FINDINGS: Truncated and motion degraded study. No evidence of infarct or purulence. No hydrocephalus or shift. IMPRESSION: Truncated and motion degraded study. No evidence of infarct or purulence by diffusion imaging. No hydrocephalus or shift. Electronically Signed   By: Tiburcio Pea M.D.   On: 07/02/2023 10:27   EEG adult  Result Date: 07/02/2023 Charlsie Quest, MD     07/02/2023  8:51 AM Patient Name: DANIELLE GAMBINO MRN: [id number] Epilepsy Attending: Charlsie Quest Referring Physician/Provider: Gordy Councilman, MD Date: 07/02/2023 Duration: 23.28 mins Patient history: 62 yo F with ams getting eeg to evaluate for seizure Level of alertness: Awake AEDs during EEG study: None Technical aspects: This EEG study was done with scalp electrodes positioned according to the 10-20 International system of electrode placement. Electrical activity was reviewed with band pass filter of 1-70Hz , sensitivity of 7 uV/mm, display speed of 79mm/sec with a 60Hz  notched filter applied as appropriate. EEG  data were recorded continuously and digitally stored.  Video monitoring was available and reviewed as appropriate. Description: The posterior dominant rhythm consists of 7 Hz activity of moderate voltage (25-35 uV) seen predominantly in posterior head regions, symmetric and reactive to eye opening and eye closing. EEG showed continuous generalized 5 to 7 Hz theta slowing. Hyperventilation and photic stimulation were not performed.   ABNORMALITY - Continuous slow, generalized  - Background slow IMPRESSION: This study is suggestive of mild diffuse encephalopathy. No seizures or epileptiform discharges were seen throughout the recording. Priyanka Annabelle Harman   CT ANGIO HEAD NECK W WO CM  Result Date: 07/02/2023 CLINICAL DATA:  Follow-up examination for stroke, altered mental status. EXAM: CT ANGIOGRAPHY HEAD AND NECK WITH AND WITHOUT CONTRAST TECHNIQUE: Multidetector CT imaging of the head and neck was performed using the standard protocol during bolus administration of intravenous contrast. Multiplanar CT image reconstructions and MIPs were obtained to evaluate the vascular anatomy. Carotid stenosis measurements (when applicable) are obtained utilizing NASCET criteria, using the distal internal carotid diameter as the denominator. RADIATION DOSE REDUCTION: This exam was performed according to the departmental dose-optimization program which includes automated exposure control, adjustment of the mA and/or kV according to patient size and/or use of iterative reconstruction technique. CONTRAST:  75mL OMNIPAQUE IOHEXOL 350 MG/ML SOLN COMPARISON:  CT from earlier the same day. FINDINGS: CTA NECK FINDINGS Aortic arch: Standard branching. Imaged portion shows no evidence of aneurysm or dissection. No significant stenosis of the major arch vessel origins. Right carotid system: Right common and internal carotid arteries are patent without dissection. Atheromatous change about the right carotid bulb without hemodynamically significant greater than 50% stenosis. Superimposed small penetrating plaque noted at the posterior aspect of the proximal cervical right ICA (series 6, image 183). Left carotid system: Left common and internal carotid arteries are patent without dissection. Mild atheromatous change about the left carotid bulb without hemodynamically significant stenosis. Vertebral arteries: Both vertebral arteries arise from the subclavian arteries. Left vertebral artery dominant. Vertebral  arteries patent without stenosis or dissection. Skeleton: No worrisome osseous lesions. Moderate spondylosis present at C5-6 and C6-7. Other neck: No other acute finding. Upper chest: No other acute finding. Review of the MIP images confirms the above findings CTA HEAD FINDINGS Anterior circulation: Mild atheromatous change about the carotid siphons without hemodynamically significant stenosis. A1 segments patent bilaterally. Normal anterior communicating artery complex. Anterior cerebral arteries patent without stenosis. No M1 stenosis or occlusion. Distal MCA branches perfused and symmetric. Posterior circulation: Both V4 segments patent without stenosis. Both PICA patent. Basilar patent without stenosis. Superior cerebral arteries patent bilaterally. Left PCA supplied via the basilar. Fetal type origin of the right PCA. Both PCAs patent to their distal aspects without significant stenosis. Venous sinuses: Not well assessed due to timing of the contrast bolus. Anatomic variants: As above. No aneurysm. 8 mm presumed meningioma noted overlying the left frontal convexity (series 6, image 37). No visible associated edema. Review of the MIP images confirms the above findings IMPRESSION: 1. Negative CTA for large vessel occlusion or other emergent finding. 2. Mild atheromatous change about the carotid bifurcations and carotid siphons without hemodynamically significant stenosis. Superimposed small penetrating plaque at the right carotid bulb as above. 3. 8 mm presumed meningioma overlying the left frontal convexity. Electronically Signed   By: Rise Mu M.D.   On: 07/02/2023 03:33   DG Abd Portable 1 View  Result Date: 07/02/2023 CLINICAL DATA:  MRI screening EXAM: PORTABLE ABDOMEN - 1 VIEW COMPARISON:  None Available. FINDINGS: The bowel gas pattern is normal. No radio-opaque calculi or other significant radiographic abnormality are seen. No retained metallic foreign body identified within the  visualized abdomen. IMPRESSION: 1. No retained metallic foreign body. Electronically Signed   By: Helyn Numbers M.D.   On: 07/02/2023 02:24   CT Head Wo Contrast  Result Date: 07/02/2023 CLINICAL DATA:  Mental status change EXAM: CT HEAD WITHOUT CONTRAST TECHNIQUE: Contiguous axial images were obtained from the base of the skull through the vertex without intravenous contrast. RADIATION DOSE REDUCTION: This exam was performed according to the departmental dose-optimization program which includes automated exposure control, adjustment of the mA and/or kV according to patient size and/or use of iterative reconstruction technique. COMPARISON:  Head CT 05/22/2018, head CT 10/23/2013. FINDINGS: Brain: There is a stable hyperdense extra-axial mass in the left frontal region measuring 9 by 6 by 6 mm. There is no acute intracranial hemorrhage, mass effect, midline shift or extra-axial fluid collection. No acute infarct identified. Vascular: No hyperdense vessel or unexpected calcification. Skull: Normal. Negative for fracture or focal lesion. Sinuses/Orbits: No acute finding. Other: None. IMPRESSION: 1. No acute intracranial process. 2. Stable 9 mm extra-axial mass in the left frontal region, likely a meningioma. Electronically Signed   By: Darliss Cheney M.D.   On: 07/02/2023 00:34   DG Chest Port 1 View  Result Date: 07/02/2023 CLINICAL DATA:  Altered mental status. EXAM: PORTABLE CHEST 1 VIEW COMPARISON:  05/22/2018 FINDINGS: Stable cardiomediastinal silhouette. Aortic atherosclerotic calcification. No focal consolidation, pleural effusion, or pneumothorax. No displaced rib fractures. Surgical clips in the axilla bilaterally. IMPRESSION: No acute cardiopulmonary disease. Electronically Signed   By: Minerva Fester M.D.   On: 07/02/2023 00:13    Pending Labs Unresulted Labs (From admission, onward)     Start     Ordered   07/02/23 0500  Basic metabolic panel  Daily,   R      07/02/23 0305   07/02/23 0500   CBC  Daily,   R      07/02/23 0305   07/02/23 0231  Respiratory (~20 pathogens) panel by PCR  (Respiratory panel by PCR (~20 pathogens, ~24 hr TAT)  w precautions)  Once,   R        07/02/23 0230   07/02/23 0033  Culture, blood (routine x 2)  BLOOD CULTURE X 2,   R      07/02/23 0032            Vitals/Pain Today's Vitals   07/02/23 1140 07/02/23 1156 07/02/23 1254 07/02/23 1314  BP:   (!) 111/98   Pulse:   95   Resp:   16   Temp:  97.7 F (36.5 C)    TempSrc:      SpO2:   96%   Weight:      Height:      PainSc: 10-Worst pain ever   9     Isolation Precautions Droplet precaution  Medications Medications  cefTRIAXone (ROCEPHIN) 2 g in sodium chloride 0.9 % 100 mL IVPB (0 g Intravenous Stopped 07/02/23 1256)  ampicillin (OMNIPEN) 2 g in sodium chloride 0.9 % 100 mL IVPB (0 g Intravenous Stopped 07/02/23 1238)  acyclovir (ZOVIRAX) 820 mg in dextrose 5 % 250 mL IVPB (0 mg Intravenous Stopped 07/02/23 1153)  0.9 %  sodium chloride infusion (0 mLs Intravenous Stopped 07/02/23 1314)  insulin aspart (novoLOG) injection 0-6 Units ( Subcutaneous Not Given 07/02/23 1238)  sodium chloride flush (NS) 0.9 %  injection 3 mL (3 mLs Intravenous Given 07/02/23 1027)  acetaminophen (TYLENOL) tablet 650 mg (650 mg Oral Given 07/02/23 1030)    Or  acetaminophen (TYLENOL) suppository 650 mg ( Rectal See Alternative 07/02/23 1030)  oxyCODONE (Oxy IR/ROXICODONE) immediate release tablet 5 mg (5 mg Oral Given 07/02/23 0733)  HYDROmorphone (DILAUDID) injection 0.5-1 mg (1 mg Intravenous Given 07/02/23 0904)  polyethylene glycol (MIRALAX / GLYCOLAX) packet 17 g (has no administration in time range)  prochlorperazine (COMPAZINE) injection 5 mg (has no administration in time range)  labetalol (NORMODYNE) injection 10 mg (has no administration in time range)  enoxaparin (LOVENOX) injection 40 mg (has no administration in time range)  vancomycin (VANCOCIN) IVPB 1000 mg/200 mL premix (has no  administration in time range)    Followed by  vancomycin (VANCOREADY) IVPB 500 mg/100 mL (has no administration in time range)  iohexol (OMNIPAQUE) 350 MG/ML injection 75 mL (75 mLs Intravenous Contrast Given 07/02/23 0015)  lidocaine (PF) (XYLOCAINE) 1 % injection 5 mL (5 mLs Infiltration Given 07/02/23 0134)  LORazepam (ATIVAN) injection 1 mg (1 mg Intravenous Given 07/02/23 0133)  vancomycin (VANCOCIN) IVPB 1000 mg/200 mL premix (0 mg Intravenous Stopped 07/02/23 0433)    Followed by  vancomycin (VANCOCIN) IVPB 1000 mg/200 mL premix (0 mg Intravenous Stopped 07/02/23 0354)  sodium chloride 0.9 % bolus 1,000 mL (0 mLs Intravenous Stopped 07/02/23 0355)  potassium chloride 10 mEq in 100 mL IVPB (0 mEq Intravenous Stopped 07/02/23 0803)  lactated ringers bolus 1,000 mL (0 mLs Intravenous Stopped 07/02/23 0455)    Mobility walks     Focused Assessments Cardiac Assessment Handoff:  Cardiac Rhythm: Sinus tachycardia Lab Results  Component Value Date   CKTOTAL 309 (H) 07/02/2023   No results found for: "DDIMER" Does the Patient currently have chest pain? No    R Recommendations: See Admitting Provider Note  Report given to:   Additional Notes: Family at bedside, [place] in place

## 2023-07-02 NOTE — H&P (Signed)
History and Physical    Margaret [name] XBM:[id number] DOB: [birth year] DOA: 07/01/2023  PCP: [name], MD   Patient coming from: Home   Chief Complaint: Headache, confusion   HPI: Margaret [name] is a 62 y.o. female with medical history significant for hypertension, diabetes mellitus, depression, BMI 36, and history of breast cancer status post bilateral mastectomy who presents with headache and confusion.  Patient was experiencing upper respiratory symptoms for a couple days, has had N/V, and then developed headache and confusion yesterday.  She was noted to become confused at roughly 6 PM, was not showing any improvement, and so she was brought into the ED.  Patient provides a very limited history, complaints of headache and neck pain, but denies abdominal pain or chest pain.  ED Course: Upon arrival to the ED, patient is found to be afebrile and saturating well on room air with tachypnea, tachycardia, and elevated blood pressure.  EKG demonstrates sinus tachycardia, chest x-ray is negative for acute cardiopulmonary disease, and no acute intracranial abnormalities noted on head CT.  Labs are most notable for potassium 3.1, WBC 17,100, and lactic acid 3.0.  Patient was evaluated by neurology in the emergency department, LP was performed and CSF sent for analysis, blood cultures were collected, and the patient was treated with acyclovir, ampicillin, Rocephin, vancomycin, Ativan, and IV fluids.  Review of Systems:  ROS limited by patient's clinical condition.  Past Medical History:  Diagnosis Date   Allergy    Anxiety    Arthritis    Bilateral breast cancer (HCC)    Breast cancer (HCC)    Breast cancer of upper-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) 04/21/2015   Depression    Diabetes mellitus without complication (HCC)    GERD (gastroesophageal reflux disease)    Hypertension     Past Surgical History:  Procedure Laterality Date   ABDOMINAL HYSTERECTOMY  1990    MASTECTOMY MODIFIED RADICAL Left 05/09/2015   MASTECTOMY MODIFIED RADICAL Left 05/09/2015   Procedure: LEFT MODIFIED RADICAL MASTETCTOMY;  Surgeon: Chevis Pretty III, MD;  Location: [facility] OR;  Service: General;  Laterality: Left;   MASTECTOMY W/ SENTINEL NODE BIOPSY Right    MASTECTOMY W/ SENTINEL NODE BIOPSY Right 05/09/2015   Procedure: RIGHT MASTECTOMY WITH RIGHT AXILLARY SENTINEL LYMPH NODE BIOPSY;  Surgeon: Chevis Pretty III, MD;  Location: [facility] OR;  Service: General;  Laterality: Right;   PORTACATH PLACEMENT Right 09/01/2015   Procedure: INSERTION PORT-A-CATH;  Surgeon: Chevis Pretty III, MD;  Location: [facility];  Service: General;  Laterality: Right;   TUBAL LIGATION  1984    Social History:   reports that she quit smoking about 4 years ago. Her smoking use included cigarettes. She started smoking about 32 years ago. She has a 2.8 pack-year smoking history. She has never used smokeless tobacco. She reports that she does not currently use alcohol. She reports that she does not use drugs.  Allergies  Allergen Reactions   Banana Hives    Tongue itching   Latex Itching and Other (See Comments)    burning   Peanuts [Peanut Oil] Hives    Patient is allergic to all tree nuts   Wheat Hives    Family History  Problem Relation Age of Onset   Hypertension Mother    Diabetes Mother    Colon cancer Neg Hx    Esophageal cancer Neg Hx    Rectal cancer Neg Hx    Stomach cancer Neg Hx  Prior to Admission medications   Medication Sig Start Date End Date Taking? Authorizing Provider  atorvastatin (LIPITOR) 20 MG tablet Take 1 tablet (20 mg total) by mouth daily. Annual appt due in August must see provider for future refills 06/21/23   [name], MD  baclofen (LIORESAL) 20 MG tablet Take 1 tablet (20 mg total) by mouth 3 (three) times daily. 03/05/23   [name], MD  Blood Glucose Monitoring Suppl (BLOOD GLUCOSE MONITOR SYSTEM) w/Device KIT Use to test blood sugar 3  times a day 12/28/20   [name], MD  busPIRone (BUSPAR) 5 MG tablet Take 1 tablet (5 mg total) by mouth daily as needed. 06/19/22   [name], MD  calcium-vitamin D (OSCAL WITH D) 500-200 MG-UNIT tablet Take 1 tablet by mouth 2 (two) times daily. 05/06/17   Loa Socks, NP  clobetasol cream (TEMOVATE) 0.05 % Apply 1 Application topically 2 (two) times daily. 03/26/23   [name], MD  diphenhydrAMINE (BENADRYL) 25 mg capsule Take 1 capsule (25 mg total) by mouth every 6 (six) hours as needed. Patient taking differently: Take 25 mg by mouth every 6 (six) hours as needed for allergies. 01/31/17   Magrinat, Valentino Hue, MD  gabapentin (NEURONTIN) 300 MG capsule Take 2 capsules (600 mg total) by mouth 3 (three) times daily. 06/12/23   Rachel Moulds, MD  glimepiride (AMARYL) 4 MG tablet Take 1 tablet (4 mg total) by mouth daily before breakfast. 03/15/23   [name], MD  glucose blood test strip Use to test blood sugar 2 times a day 01/21/23   [name], MD  ibuprofen (ADVIL) 800 MG tablet Take 1 tablet (800 mg total) by mouth every 8 (eight) hours as needed. 05/16/23   [name], MD  Lancets [facility] DELICA PLUS [place]) MISC Use to check blood sugars twice a day 01/21/23   [name], MD  meloxicam (MOBIC) 15 MG tablet Take 1 tablet (15 mg total) by mouth daily. 01/15/23   Hyatt, Max T, DPM  Multiple Vitamins-Minerals (CENTRUM SILVER 50+WOMEN PO) Take 1 tablet by mouth daily.    [provider]  Olmesartan-amLODIPine-HCTZ 20-5-12.5 MG TABS Take 1 tablet by mouth daily. 12/11/22   [name], MD  pantoprazole (PROTONIX) 40 MG tablet TAKE 1 TABLET BY MOUTH ONCE DAILY 05/16/23   [name], MD  potassium chloride SA (KLOR-CON M) 20 MEQ tablet Take 1 tablet (20 mEq total) by mouth 2 (two) times daily. 06/26/23   [name], MD  Semaglutide, 1 MG/DOSE, 4 MG/3ML SOPN Inject 1 mg as  directed once a week. 12/14/22   [name], MD  Semaglutide, 2 MG/DOSE, 8 MG/3ML SOPN Inject 2 mg as directed once a week. 06/11/23   [name], MD  traMADol (ULTRAM) 50 MG tablet Take 1 tablet (50 mg total) by mouth daily as needed. 06/24/23   [name], MD  traZODone (DESYREL) 100 MG tablet Take 1 tablet (100 mg total) by mouth at bedtime. 03/26/23   [name], MD  valACYclovir (VALTREX) 1000 MG tablet Take 1 tablet (1,000 mg total) by mouth 2 (two) times daily. 11/16/22   Rachel Moulds, MD  venlafaxine XR (EFFEXOR-XR) 150 MG 24 hr capsule Take 1 capsule (150 mg total) by mouth daily with breakfast. 03/26/23   [name], MD  losartan-hydrochlorothiazide [facility]) 100-25 MG tablet TAKE 1 TABLET BY MOUTH ONCE DAILY 08/19/20 09/15/20  Magrinat, Raymond Gurney  C, MD    Physical Exam: Vitals:   07/01/23 2320 07/02/23 0015 07/02/23 0036 07/02/23 0045  BP:  (!) 156/92  (!) 148/91  Pulse:  (!) 109  (!) 109  Resp:  (!) 27  (!) 22  Temp:   99.4 F (37.4 C)   TempSrc:   Rectal   SpO2:  100%  100%  Weight: 107.5 kg     Height: 5' 8.5" (1.74 m)       Constitutional: NAD, no pallor or diaphoresis   Eyes: PERTLA, lids and conjunctivae normal ENMT: Mucous membranes are dry. Posterior pharynx clear of any exudate or lesions.   Neck: supple, no masses  Respiratory: no wheezing, no crackles. No accessory muscle use.  Cardiovascular: S1 & S2 heard, regular rate and rhythm. No extremity edema.   Abdomen: No distension, no tenderness, soft. Bowel sounds active.  Musculoskeletal: no clubbing / cyanosis. No joint deformity upper and lower extremities.   Skin: no significant rashes, lesions, ulcers. Warm, dry, well-perfused. Neurologic: CN 2-12 grossly intact. Sensation to light tough intact. Moving all extremities. Alert and oriented.    Labs and Imaging on Admission: I have personally reviewed following labs and imaging studies  CBC: Recent Labs  Lab  07/01/23 2330 07/02/23 0045  WBC 17.8* 17.1*  NEUTROABS 14.8* 13.5*  HGB 15.6* 14.2  HCT 48.8* 43.7  MCV 91.4 90.3  PLT 211 218   Basic Metabolic Panel: Recent Labs  Lab 07/01/23 2330 07/02/23 0045 07/02/23 0046  NA 134*  --  134*  K 3.2*  --  3.1*  CL 100  --  100  CO2 21*  --  22  GLUCOSE 156*  --  131*  BUN 15  --  14  CREATININE 0.87  --  0.75  CALCIUM 9.6  --  9.6  MG  --  1.9  --    GFR: Estimated Creatinine Clearance: 94.5 mL/min (by C-G formula based on SCr of 0.75 mg/dL). Liver Function Tests: Recent Labs  Lab 07/01/23 2330 07/02/23 0046  AST 25 23  ALT 23 22  ALKPHOS 77 74  BILITOT 0.7 0.6  PROT 8.4* 7.4  ALBUMIN 4.1 3.8   No results for input(s): "LIPASE", "AMYLASE" in the last 168 hours. Recent Labs  Lab 07/02/23 0046  AMMONIA 23   Coagulation Profile: No results for input(s): "INR", "PROTIME" in the last 168 hours. Cardiac Enzymes: Recent Labs  Lab 07/02/23 0045  CKTOTAL 309*   BNP (last 3 results) No results for input(s): "PROBNP" in the last 8760 hours. HbA1C: No results for input(s): "HGBA1C" in the last 72 hours. CBG: Recent Labs  Lab 07/01/23 2332  GLUCAP 164*   Lipid Profile: No results for input(s): "CHOL", "HDL", "LDLCALC", "TRIG", "CHOLHDL", "LDLDIRECT" in the last 72 hours. Thyroid Function Tests: Recent Labs    07/02/23 0046  TSH 1.232   Anemia Panel: Recent Labs    07/02/23 0055  VITAMINB12 835   Urine analysis:    Component Value Date/Time   COLORURINE YELLOW 07/02/2023 0034   APPEARANCEUR CLEAR 07/02/2023 0034   LABSPEC >1.046 (H) 07/02/2023 0034   PHURINE 6.0 07/02/2023 0034   GLUCOSEU NEGATIVE 07/02/2023 0034   GLUCOSEU NEGATIVE 12/11/2022 1103   HGBUR NEGATIVE 07/02/2023 0034   BILIRUBINUR NEGATIVE 07/02/2023 0034   KETONESUR 20 (A) 07/02/2023 0034   PROTEINUR 30 (A) 07/02/2023 0034   UROBILINOGEN 1.0 12/11/2022 1103   NITRITE NEGATIVE 07/02/2023 0034   LEUKOCYTESUR NEGATIVE 07/02/2023 0034    Sepsis Labs: @  LABRCNTIP(procalcitonin:4,lacticidven:4) ) Recent Results (from the past 240 hour(s))  SARS Coronavirus 2 by RT PCR (hospital order, performed in [facility] hospital lab) *cepheid single result test* Anterior Nasal Swab     Status: None   Collection Time: 07/01/23 11:30 PM   Specimen: Anterior Nasal Swab  Result Value Ref Range Status   SARS Coronavirus 2 by RT PCR NEGATIVE NEGATIVE Final    Comment: Performed at [facility], [address] [place], [place] [address]     Radiological Exams on Admission: DG Abd Portable 1 View  Result Date: 07/02/2023 CLINICAL DATA:  MRI screening EXAM: PORTABLE ABDOMEN - 1 VIEW COMPARISON:  None Available. FINDINGS: The bowel gas pattern is normal. No radio-opaque calculi or other significant radiographic abnormality are seen. No retained metallic foreign body identified within the visualized abdomen. IMPRESSION: 1. No retained metallic foreign body. Electronically Signed   By: Helyn Numbers M.D.   On: 07/02/2023 02:24   CT Head Wo Contrast  Result Date: 07/02/2023 CLINICAL DATA:  Mental status change EXAM: CT HEAD WITHOUT CONTRAST TECHNIQUE: Contiguous axial images were obtained from the base of the skull through the vertex without intravenous contrast. RADIATION DOSE REDUCTION: This exam was performed according to the departmental dose-optimization program which includes automated exposure control, adjustment of the mA and/or kV according to patient size and/or use of iterative reconstruction technique. COMPARISON:  Head CT 05/22/2018, head CT 10/23/2013. FINDINGS: Brain: There is a stable hyperdense extra-axial mass in the left frontal region measuring 9 by 6 by 6 mm. There is no acute intracranial hemorrhage, mass effect, midline shift or extra-axial fluid collection. No acute infarct identified. Vascular: No hyperdense vessel or unexpected calcification. Skull: Normal. Negative for fracture or focal lesion. Sinuses/Orbits: No  acute finding. Other: None. IMPRESSION: 1. No acute intracranial process. 2. Stable 9 mm extra-axial mass in the left frontal region, likely a meningioma. Electronically Signed   By: Darliss Cheney M.D.   On: 07/02/2023 00:34   DG Chest Port 1 View  Result Date: 07/02/2023 CLINICAL DATA:  Altered mental status. EXAM: PORTABLE CHEST 1 VIEW COMPARISON:  05/22/2018 FINDINGS: Stable cardiomediastinal silhouette. Aortic atherosclerotic calcification. No focal consolidation, pleural effusion, or pneumothorax. No displaced rib fractures. Surgical clips in the axilla bilaterally. IMPRESSION: No acute cardiopulmonary disease. Electronically Signed   By: Minerva Fester M.D.   On: 07/02/2023 00:13    EKG: Independently reviewed. Sinus tachycardia, rate 110.   Assessment/Plan   1. Acute encephalopathy  - Ammonia, TSH, and B12 are normal and no acute findings noted on head CT in ED   - With multiple SIRS criteria, headache and neck pain, and N/V, LP was performed, CSF sent for analysis, and she was started on acyclovir, ampicillin, Rocephin, and vancomycin - Continue current treatment, follow cultures, pending CSF studies, and clinical course   2. Type II DM  - A1c was 7.0% in August 2024  - Check CBGs and use low-intensity SSI for now    3. HTN  - Treat as-needed only for now    4. Hypokalemia  - Replacing    DVT prophylaxis: Lovenox  Code Status: Full  Level of Care: Level of care: Progressive Family Communication: Husband updated from ED  Disposition Plan: Patient is from: Home  Anticipated d/c is to: TBD Anticipated d/c date is: 07/05/23  Patient currently: Pending CSF studies, cultures, clinical improvement  Consults called: Neurology  Admission status: Inpatient     Briscoe Deutscher, MD [facility]  07/02/2023, 3:05 AM

## 2023-07-02 NOTE — Progress Notes (Signed)
Unable to reach RN, eeg attempted, LP in progress. Reattempting as schedule allows

## 2023-07-02 NOTE — ED Notes (Signed)
Pt keeps removing herself from the cardiac monitor displacing leads. Leads changed for the third time already. Redirection unsuccessful. Pt has altered mental status and is confused.

## 2023-07-02 NOTE — Procedures (Signed)
Patient Name: AYDIN SEFTON  MRN: [id number]  Epilepsy Attending: Charlsie Quest  Referring Physician/Provider: Gordy Councilman, MD  Date: 07/02/2023 Duration: 23.28 mins  Patient history: 62 yo F with ams getting eeg to evaluate for seizure  Level of alertness: Awake  AEDs during EEG study: None  Technical aspects: This EEG study was done with scalp electrodes positioned according to the 10-20 International system of electrode placement. Electrical activity was reviewed with band pass filter of 1-70Hz , sensitivity of 7 uV/mm, display speed of 63mm/sec with a 60Hz  notched filter applied as appropriate. EEG data were recorded continuously and digitally stored.  Video monitoring was available and reviewed as appropriate.  Description: The posterior dominant rhythm consists of 7 Hz activity of moderate voltage (25-35 uV) seen predominantly in posterior head regions, symmetric and reactive to eye opening and eye closing. EEG showed continuous generalized 5 to 7 Hz theta slowing. Hyperventilation and photic stimulation were not performed.     ABNORMALITY - Continuous slow, generalized - Background slow  IMPRESSION: This study is suggestive of mild diffuse encephalopathy. No seizures or epileptiform discharges were seen throughout the recording.  Altha Sweitzer Annabelle Harman

## 2023-07-02 NOTE — ED Notes (Signed)
Charge RN informed primary RN of critical finding in CSF.

## 2023-07-02 NOTE — Progress Notes (Signed)
Pharmacy Antibiotic Note  Margaret [name] is a 62 y.o. female admitted on 07/01/2023 with AMS and concern for meningitis.  LP preformed in ED and acyclovir, ampicillin, ceftriaxone and vancomycin started as empiric regimen. Pharmacy has been consulted for acyclovir and vancomycin dosing.  Plan: Acyclovir 820mg  q8h (dosed per 10mg /kg adj body weight) Normal saline @125mL /hr to prevent nephrotoxicity with acyclovir   Vancomycin 2g load followed by 1500mg  q12h (goal trough 15-20) Ceftriaxone 2g twice daily and ampicillin 2g q4h per MD F/u renal function, micro work up and narrow as able Vancomycin levels as needed  Height: 5' 8.5" (174 cm) Weight: 107.5 kg (236 lb 15.9 oz) IBW/kg (Calculated) : 65.05  Temp (24hrs), Avg:99.3 F (37.4 C), Min:99.1 F (37.3 C), Max:99.4 F (37.4 C)  Recent Labs  Lab 07/01/23 2330 07/02/23 0045 07/02/23 0052  WBC 17.8* 17.1*  --   CREATININE 0.87  --   --   LATICACIDVEN  --   --  3.0*    Estimated Creatinine Clearance: 86.9 mL/min (by C-G formula based on SCr of 0.87 mg/dL).    Allergies  Allergen Reactions   Banana Hives    Tongue itching   Latex Itching and Other (See Comments)    burning   Peanuts [Peanut Oil] Hives    Patient is allergic to all tree nuts   Wheat Hives    Antimicrobials this admission: Acyclovir 11/26 > Ampicillin 11/26 > Ceftriaxone 11/26 > Vancomycin 11/26 >  Microbiology results: 1/26 Bcx: pending 1/26 CSF cx: pending 1/26 CSF rapid panel: HSV 2  1/26 resp panel: pending 11/25 covid: neg  Thank you for allowing pharmacy to be a part of this patient's care.  Marja Kays 07/02/2023 1:21 AM

## 2023-07-02 NOTE — Progress Notes (Signed)
EEG complete - results pending 

## 2023-07-02 NOTE — Consult Note (Signed)
NEUROLOGY CONSULT NOTE   Date of service: July 02, 2023 Patient Name: Margaret [name] MRN:  [id number] DOB:  02-14-1961 Chief Complaint: "confusion" Requesting Provider: Tilden Fossa, MD  History of Present Illness  Margaret [name] is a 62 y.o. female  has a past medical history of Allergy, Anxiety, Arthritis, Bilateral breast cancer (HCC), Breast cancer (HCC), Breast cancer of upper-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) (04/21/2015), Depression, Diabetes mellitus without complication (HCC), GERD (gastroesophageal reflux disease), and Hypertension.     She was feeling unwell at work yesterday, reportedly vomited and came home although significant caveat of the fact that the patient is very altered at this time.  She is presently complaining of a headache which her husband at bedside reports she was not complaining of yesterday.  She had more emesis today.  She last week to the has been normally around 6 PM but due to worsening confusion he brought her in for evaluation and I was consulted  LKW: 6 PM on 11/25 Thrombolytic given?: No, out of the window IA performed?: No, no LVO Premorbid modified rankin scale:      0 - No symptoms.     ROS  Unable to obtain due to altered mental status  Past History   Past Medical History:  Diagnosis Date   Allergy    Anxiety    Arthritis    Bilateral breast cancer (HCC)    Breast cancer (HCC)    Breast cancer of upper-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) 04/21/2015   Depression    Diabetes mellitus without complication (HCC)    GERD (gastroesophageal reflux disease)    Hypertension     Past Surgical History:  Procedure Laterality Date   ABDOMINAL HYSTERECTOMY  1990   MASTECTOMY MODIFIED RADICAL Left 05/09/2015   MASTECTOMY MODIFIED RADICAL Left 05/09/2015   Procedure: LEFT MODIFIED RADICAL MASTETCTOMY;  Surgeon: Chevis Pretty III, MD;  Location: [facility] OR;  Service: General;  Laterality: Left;   MASTECTOMY W/ SENTINEL NODE BIOPSY  Right    MASTECTOMY W/ SENTINEL NODE BIOPSY Right 05/09/2015   Procedure: RIGHT MASTECTOMY WITH RIGHT AXILLARY SENTINEL LYMPH NODE BIOPSY;  Surgeon: Chevis Pretty III, MD;  Location: [facility] OR;  Service: General;  Laterality: Right;   PORTACATH PLACEMENT Right 09/01/2015   Procedure: INSERTION PORT-A-CATH;  Surgeon: Chevis Pretty III, MD;  Location: [facility];  Service: General;  Laterality: Right;   TUBAL LIGATION  1984    Family History: Family History  Problem Relation Age of Onset   Hypertension Mother    Diabetes Mother    Colon cancer Neg Hx    Esophageal cancer Neg Hx    Rectal cancer Neg Hx    Stomach cancer Neg Hx     Social History  reports that she quit smoking about 4 years ago. Her smoking use included cigarettes. She started smoking about 32 years ago. She has a 2.8 pack-year smoking history. She has never used smokeless tobacco. She reports that she does not currently use alcohol. She reports that she does not use drugs.  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  Current Outpatient Medications:    atorvastatin (LIPITOR) 20 MG tablet, Take 1  tablet (20 mg total) by mouth daily. Annual appt due in August must see provider for future refills, Disp: 90 tablet, Rfl: 0   baclofen (LIORESAL) 20 MG tablet, Take 1 tablet (20 mg total) by mouth 3 (three) times daily., Disp: 90 tablet, Rfl: 5   Blood Glucose Monitoring Suppl (BLOOD GLUCOSE MONITOR SYSTEM) w/Device KIT, Use to test blood sugar 3 times a day, Disp: 1 kit, Rfl: 0   busPIRone (BUSPAR) 5 MG tablet, Take 1 tablet (5 mg total) by mouth daily as needed., Disp: 30 tablet, Rfl: 2   calcium-vitamin D (OSCAL WITH D) 500-200 MG-UNIT tablet, Take 1 tablet by mouth 2 (two) times  daily., Disp: 60 tablet, Rfl: 0   clobetasol cream (TEMOVATE) 0.05 %, Apply 1 Application topically 2 (two) times daily., Disp: 30 g, Rfl: 3   diphenhydrAMINE (BENADRYL) 25 mg capsule, Take 1 capsule (25 mg total) by mouth every 6 (six) hours as needed. (Patient taking differently: Take 25 mg by mouth every 6 (six) hours as needed for allergies.), Disp: 100 capsule, Rfl: 3   gabapentin (NEURONTIN) 300 MG capsule, Take 2 capsules (600 mg total) by mouth 3 (three) times daily., Disp: 180 capsule, Rfl: 2   glimepiride (AMARYL) 4 MG tablet, Take 1 tablet (4 mg total) by mouth daily before breakfast., Disp: 90 tablet, Rfl: 2   glucose blood test strip, Use to test blood sugar 2 times a day, Disp: 100 each, Rfl: 0   ibuprofen (ADVIL) 800 MG tablet, Take 1 tablet (800 mg total) by mouth every 8 (eight) hours as needed., Disp: 90 tablet, Rfl: 1   Lancets (ONETOUCH DELICA PLUS LANCET33G) MISC, Use to check blood sugars twice a day, Disp: 100 each, Rfl: 0   meloxicam (MOBIC) 15 MG tablet, Take 1 tablet (15 mg total) by mouth daily., Disp: 30 tablet, Rfl: 3   Multiple Vitamins-Minerals (CENTRUM SILVER 50+WOMEN PO), Take 1 tablet by mouth daily., Disp: , Rfl:    Olmesartan-amLODIPine-HCTZ 20-5-12.5 MG TABS, Take 1 tablet by mouth daily., Disp: 90 tablet, Rfl: 3   pantoprazole (PROTONIX) 40 MG tablet, TAKE 1 TABLET BY MOUTH ONCE DAILY, Disp: 90 tablet, Rfl: 3   potassium chloride SA (KLOR-CON M) 20 MEQ tablet, Take 1 tablet (20 mEq total) by mouth 2 (two) times daily., Disp: 90 tablet, Rfl: 0   Semaglutide, 1 MG/DOSE, 4 MG/3ML SOPN, Inject 1 mg as directed once a week., Disp: 3 mL, Rfl: 0   Semaglutide, 2 MG/DOSE, 8 MG/3ML SOPN, Inject 2 mg as directed once a week., Disp: 3 mL, Rfl: 1   traMADol (ULTRAM) 50 MG tablet, Take 1 tablet (50 mg total) by mouth daily as needed., Disp: 30 tablet, Rfl: 0   traZODone (DESYREL) 100 MG tablet, Take 1 tablet (100 mg total) by mouth at bedtime., Disp: 90 tablet, Rfl: 3    valACYclovir (VALTREX) 1000 MG tablet, Take 1 tablet (1,000 mg total) by mouth 2 (two) times daily., Disp: 90 tablet, Rfl: 4   venlafaxine XR (EFFEXOR-XR) 150 MG 24 hr capsule, Take 1 capsule (150 mg total) by mouth daily with breakfast., Disp: 90 capsule, Rfl: 0  Vitals   Vitals:   07/01/23 2315 07/01/23 2320 07/02/23 0015  BP: (!) 160/92  (!) 156/92  Pulse: (!) 111  (!) 109  Resp: (!) 21  (!) 27  Temp: 99.1 F (37.3 C)    TempSrc: Oral    SpO2: 96%  100%  Weight:  107.5 kg   Height:  5' 8.5" (  1.74 m)     Body mass index is 35.51 kg/m.  Physical Exam   Constitutional: Appears restless, uncomfortable.  Warm to the touch Psych: Affect agitated but redirectable Eyes: No scleral injection.  HENT: No OP obstruction.  She does seem to resist neck flexion, but no neck flexion in response to knee flexion Head: Normocephalic.  Cardiovascular: Normal rate and regular rhythm.  Respiratory: Effort normal, non-labored breathing.  GI: Soft.  No distension. There is no tenderness.  Skin: Wound on the dorsum of her left foot which partner at bedside attributes to her footwear  Neurologic Examination   Neuro: Mental Status: Patient is awake, alert, cannot answer orientation questions reliably, follows simple commands intermittently, poor attention/concentration, frequently perseverating on prior answers.  In this setting cannot name/repeat appropriately Cranial Nerves: II: Visual Fields are full to orienting to stimuli but does not report number of fingers correctly on the left. Pupils are equal, round, and reactive to light.   III,IV, VI: EOMI to tracking examiner V: Facial sensation is symmetric to temperature VII: Facial movement is symmetric.  VIII: hearing is intact to voice X: Uvula elevates symmetrically XI: Shoulder shrug is symmetric. XII: tongue is midline without atrophy or fasciculations.  Motor: Using all 4 extremities grossly equally, can maintain extremities antigravity  without drift for at least 5 to 10 seconds Sensory: Equally reactive to light noxious stimulation in all 4 extremities Deep Tendon Reflexes: 2+ in the left biceps and patella, slightly more difficult to elicit on the right, though challenging due to her intermittent agitation Cerebellar: Unable to assess due to cooperation   Labs/Imaging/Neurodiagnostic studies    Basic Metabolic Panel: Recent Labs  Lab 07/01/23 2330  NA 134*  K 3.2*  CL 100  CO2 21*  GLUCOSE 156*  BUN 15  CREATININE 0.87  CALCIUM 9.6    CBC: Recent Labs  Lab 07/01/23 2330  WBC 17.8*  NEUTROABS 14.8*  HGB 15.6*  HCT 48.8*  MCV 91.4  PLT 211   Lipid Panel:  Lab Results  Component Value Date   LDLCALC 44 12/11/2022   HgbA1c:  Lab Results  Component Value Date   HGBA1C 7.0 03/26/2023   Urine Drug Screen: No results found for: "LABOPIA", "COCAINSCRNUR", "LABBENZ", "AMPHETMU", "THCU", "LABBARB"  Alcohol Level     Component Value Date/Time   ETH <10 07/01/2023 2330    CT Head without contrast(Personally reviewed): No acute intracranial process on my review, formal radiology report pending.  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 female  has a past medical history of Allergy, Anxiety, Arthritis, Bilateral breast cancer (HCC), Breast cancer (HCC), Breast cancer of upper-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) (04/21/2015), Depression, Diabetes mellitus without complication (HCC), GERD (gastroesophageal reflux disease), and Hypertension.   Overall picture of altered mental status in the setting of recent vomiting and generally feeling unwell without clearly localizing source of infection is concerning for possible meningitis, especially without another clear source of infection at this  time.    RECOMMENDATIONS  -Lumbar puncture with cell counts and tubes 1 and 4, protein, glucose, meningitis/encephalitis panel, bacterial smear and culture -HIV, RPR, B12, TSH, ammonia, magnesium, CK -Routine EEG -MRI brain with and without contrast -Empiric meningitis coverage with vancomycin, ceftriaxone, ampicillin, acyclovir -Neurology will follow, discussed with  Dr. Madilyn Hook at bedside and via phone ______________________________________________________________________   Signed, Gordy Councilman, MD [facility] Available 7 PM to 7 AM, outside of these hours please call Neurologist on call as listed on Amion.

## 2023-07-02 NOTE — ED Notes (Signed)
Pt keeps thrashing around in bed and constantly removing cardiac leads causing inaccurate heart rhythms. Redirection unsuccessful. Cardiac monitoring paused until pt becomes more calmer.

## 2023-07-02 NOTE — ED Notes (Signed)
Report received from Arnett C. RN. Assumed care of pt at this time. Admitting provider at bedside.

## 2023-07-03 ENCOUNTER — Other Ambulatory Visit (HOSPITAL_COMMUNITY): Payer: Self-pay

## 2023-07-03 ENCOUNTER — Other Ambulatory Visit: Payer: Self-pay

## 2023-07-03 ENCOUNTER — Inpatient Hospital Stay (HOSPITAL_COMMUNITY): Payer: 59

## 2023-07-03 DIAGNOSIS — B1009 Other human herpesvirus encephalitis: Secondary | ICD-10-CM

## 2023-07-03 DIAGNOSIS — R6510 Systemic inflammatory response syndrome (SIRS) of non-infectious origin without acute organ dysfunction: Secondary | ICD-10-CM | POA: Diagnosis not present

## 2023-07-03 DIAGNOSIS — G9340 Encephalopathy, unspecified: Secondary | ICD-10-CM | POA: Diagnosis not present

## 2023-07-03 DIAGNOSIS — I10 Essential (primary) hypertension: Secondary | ICD-10-CM

## 2023-07-03 DIAGNOSIS — E119 Type 2 diabetes mellitus without complications: Secondary | ICD-10-CM | POA: Diagnosis not present

## 2023-07-03 LAB — CBC
HCT: 43.2 % (ref 36.0–46.0)
Hemoglobin: 14.4 g/dL (ref 12.0–15.0)
MCH: 29.3 pg (ref 26.0–34.0)
MCHC: 33.3 g/dL (ref 30.0–36.0)
MCV: 88 fL (ref 80.0–100.0)
Platelets: 182 10*3/uL (ref 150–400)
RBC: 4.91 MIL/uL (ref 3.87–5.11)
RDW: 13.9 % (ref 11.5–15.5)
WBC: 11.4 10*3/uL — ABNORMAL HIGH (ref 4.0–10.5)
nRBC: 0 % (ref 0.0–0.2)

## 2023-07-03 LAB — MAGNESIUM: Magnesium: 1.6 mg/dL — ABNORMAL LOW (ref 1.7–2.4)

## 2023-07-03 LAB — GLUCOSE, CAPILLARY
Glucose-Capillary: 103 mg/dL — ABNORMAL HIGH (ref 70–99)
Glucose-Capillary: 109 mg/dL — ABNORMAL HIGH (ref 70–99)
Glucose-Capillary: 116 mg/dL — ABNORMAL HIGH (ref 70–99)
Glucose-Capillary: 119 mg/dL — ABNORMAL HIGH (ref 70–99)
Glucose-Capillary: 146 mg/dL — ABNORMAL HIGH (ref 70–99)
Glucose-Capillary: 92 mg/dL (ref 70–99)
Glucose-Capillary: 99 mg/dL (ref 70–99)

## 2023-07-03 LAB — BASIC METABOLIC PANEL
Anion gap: 13 (ref 5–15)
BUN: 5 mg/dL — ABNORMAL LOW (ref 8–23)
CO2: 23 mmol/L (ref 22–32)
Calcium: 9 mg/dL (ref 8.9–10.3)
Chloride: 101 mmol/L (ref 98–111)
Creatinine, Ser: 0.79 mg/dL (ref 0.44–1.00)
GFR, Estimated: >60 mL/min (ref >60)
Glucose, Bld: 97 mg/dL (ref 70–99)
Potassium: 2.8 mmol/L — ABNORMAL LOW (ref 3.5–5.1)
Sodium: 137 mmol/L (ref 135–145)

## 2023-07-03 LAB — PATHOLOGIST SMEAR REVIEW: Path Review: NEGATIVE

## 2023-07-03 MED ORDER — SODIUM CHLORIDE 0.9 % IV SOLN: INTRAVENOUS | Status: AC

## 2023-07-03 MED ORDER — POTASSIUM CHLORIDE 10 MEQ/100ML IV SOLN
10.0000 meq | INTRAVENOUS | Status: AC
  Administered 2023-07-03 (×4): 10 meq via INTRAVENOUS
  Filled 2023-07-03: qty 100

## 2023-07-03 MED ORDER — AMLODIPINE BESYLATE 5 MG PO TABS
5.0000 mg | ORAL_TABLET | Freq: Every day | ORAL | Status: DC
  Administered 2023-07-03 – 2023-07-07 (×5): 5 mg via ORAL
  Filled 2023-07-03 (×5): qty 1

## 2023-07-03 NOTE — Progress Notes (Signed)
NEUROLOGY CONSULT FOLLOW UP NOTE   Date of service: July 03, 2023 Patient Name: Margaret [name] MRN:  [id number] DOB:  1961-01-22   Interval Hx/subjective  Seen and examined. Sitting comfortably in chair.  Participated with the exam  Vitals   Vitals:   07/03/23 0500 07/03/23 0744 07/03/23 0800 07/03/23 0900  BP:  (!) 147/86 (!) 154/87   Pulse:  73    Resp:  17    Temp:  97.9 F (36.6 C)  98.8 F (37.1 C)  TempSrc:  Oral  Rectal  SpO2:      Weight: 100.1 kg     Height:         Body mass index is 33.07 kg/m.  Physical Exam  General: Well-developed well-nourished woman in no acute distress, comfortably sitting in her chair. HEENT: Normocephalic/atraumatic Lungs: Clear Cardiovascular: Regular rhythm Neurological exam: She was sleepy, but awakens to voice, follows all commands, is oriented to the fact that she is in the hospital but got the month wrong to December. Speech is not dysarthric There is no evidence of aphasia There is diminished attention concentration Cranial nerve II to XII intact Motor examination with no drift in any of the fours on coaching-just generally weak. Sensation intact to light touch without extinction Coordination examination with no dysmetria   Medications  Current Facility-Administered Medications:    acetaminophen (TYLENOL) tablet 650 mg, 650 mg, Oral, Q6H PRN, 650 mg at 07/02/23 1030 **OR** acetaminophen (TYLENOL) suppository 650 mg, 650 mg, Rectal, Q6H PRN, Opyd, Lavone Neri, MD   acyclovir (ZOVIRAX) 820 mg in dextrose 5 % 250 mL IVPB, 10 mg/kg (Adjusted), Intravenous, Q8H, Opyd, Lavone Neri, MD, Last Rate: 266.4 mL/hr at 07/02/23 2301, 820 mg at 07/02/23 2301   amLODipine (NORVASC) tablet 5 mg, 5 mg, Oral, Daily, Ghimire, Shanker M, MD   enoxaparin (LOVENOX) injection 40 mg, 40 mg, Subcutaneous, Q24H, Opyd, Lavone Neri, MD, 40 mg at 07/02/23 1420   HYDROmorphone (DILAUDID) injection 0.5-1 mg, 0.5-1 mg, Intravenous, Q4H PRN, Opyd, Lavone Neri,  MD, 1 mg at 07/03/23 0446   insulin aspart (novoLOG) injection 0-6 Units, 0-6 Units, Subcutaneous, Q4H, Opyd, Lavone Neri, MD, 1 Units at 07/02/23 0455   labetalol (NORMODYNE) injection 10 mg, 10 mg, Intravenous, Q2H PRN, Opyd, Lavone Neri, MD   oxyCODONE (Oxy IR/ROXICODONE) immediate release tablet 5 mg, 5 mg, Oral, Q4H PRN, Opyd, Lavone Neri, MD, 5 mg at 07/02/23 2153   polyethylene glycol (MIRALAX / GLYCOLAX) packet 17 g, 17 g, Oral, Daily PRN, Opyd, Lavone Neri, MD   prochlorperazine (COMPAZINE) injection 5 mg, 5 mg, Intravenous, Q6H PRN, Opyd, Lavone Neri, MD   sodium chloride flush (NS) 0.9 % injection 3 mL, 3 mL, Intravenous, Q12H, Opyd, Lavone Neri, MD, 3 mL at 07/03/23 0951 Labs and Diagnostic Imaging   CBC:  Recent Labs  Lab 07/01/23 2330 07/02/23 0045 07/02/23 0324 07/03/23 0425  WBC 17.8* 17.1* 15.7* 11.4*  NEUTROABS 14.8* 13.5*  --   --   HGB 15.6* 14.2 14.0 14.4  HCT 48.8* 43.7 43.1 43.2  MCV 91.4 90.3 90.4 88.0  PLT 211 218 191 182    Basic Metabolic Panel:  Lab Results  Component Value Date   NA 137 07/03/2023   K 2.8 (L) 07/03/2023   CO2 23 07/03/2023   GLUCOSE 97 07/03/2023   BUN 5 (L) 07/03/2023   CREATININE 0.79 07/03/2023   CALCIUM 9.0 07/03/2023   GFRNONAA >60 07/03/2023   GFRAA >60 03/10/2020   Lipid Panel:  Lab Results  Component Value Date   LDLCALC 44 12/11/2022   HgbA1c:  Lab Results  Component Value Date   HGBA1C 7.0 03/26/2023   Urine Drug Screen:     Component Value Date/Time   LABOPIA NONE DETECTED 07/02/2023 0034   COCAINSCRNUR NONE DETECTED 07/02/2023 0034   LABBENZ NONE DETECTED 07/02/2023 0034   AMPHETMU NONE DETECTED 07/02/2023 0034   THCU POSITIVE (A) 07/02/2023 0034   LABBARB NONE DETECTED 07/02/2023 0034  CSF: RBC count 3091, 175 and tube 4.  WBC count 318 tube 1, 216 tube 4 with lymphocyte predominance.  Protein 191.  Glucose 69.  Alcohol Level     Component Value Date/Time   [facility] <10 07/01/2023 2330   MRI Brain(Personally  reviewed): Truncated MRI with no acute abnormality  Assessment   Margaret [name] is a 62 y.o. female past history as above presenting with multiple days of generalized weakness and altered mental status with CSF indicative of HSV encephalitis.  Meningitis/encephalitis panel positive for HSV-2 PCR. Currently on treatment with acyclovir  Impression: HSV-2 encephalitis   Recommendations  Continue antiviral treatment for the duration recommended by ID. Supportive care per primary team as you are Plan discussed with Dr. Jerral Ralph Inpatient neurology will be available to questions as needed. ______________________________________________________________________   Dene Gentry, MD [facility]

## 2023-07-03 NOTE — Plan of Care (Signed)
  Problem: Coping: Goal: Ability to adjust to condition or change in health will improve Outcome: Progressing   Problem: Fluid Volume: Goal: Ability to maintain a balanced intake and output will improve Outcome: Progressing   Problem: Health Behavior/Discharge Planning: Goal: Ability to identify and utilize available resources and services will improve Outcome: Progressing   Problem: Metabolic: Goal: Ability to maintain appropriate glucose levels will improve Outcome: Progressing   Problem: Tissue Perfusion: Goal: Adequacy of tissue perfusion will improve Outcome: Progressing   Problem: Fluid Volume: Goal: Hemodynamic stability will improve Outcome: Progressing   Problem: Respiratory: Goal: Ability to maintain adequate ventilation will improve Outcome: Progressing

## 2023-07-03 NOTE — Progress Notes (Signed)
PROGRESS NOTE        PATIENT DETAILS Name: Margaret [name] Age: 62 y.o. Sex: female Date of Birth: May 25, 1961 Admit Date: 07/01/2023 Admitting Physician Briscoe Deutscher, MD ZOX:[id number], Austin Miles, MD  Brief Summary: Patient is a 62 y.o.  female with history of HTN, DM-2, breast cancer-s/p bilateral mastectomy 2016-presented with headache/confusion-underwent LP-CSF positive for HSV-2.  See below for further details.  Significant events: 11/25>> admit to [facility]  Significant studies: 11/25>> CXR: No PNA 11/26>> MRI brain: No evidence of infarct 11/26>> CTA head/neck: No LVO/hemodynamically significant stenosis 11/26>> Spot EEG: Negative for seizures 11/26>> CSF-tube 4-RBC 175-WBC 260-98% lymphocytes, protein 191-HSV II positive on bio fire panel  Significant microbiology data: 11/26>> COVID PCR: Negative 11/26>> blood culture: No growth 11/26>> CSF culture: Pending  Procedures: 11/26>> lumbar puncture  Consults: Neurology  Subjective: Lying comfortably in bed-denies any chest pain or shortness of breath.  Objective: Vitals: Blood pressure (!) 147/86, pulse 73, temperature 97.9 F (36.6 C), temperature source Oral, resp. rate 17, height 5' 8.5" (1.74 m), weight 100.1 kg, SpO2 97%.   Exam: Gen Exam:Alert awake-not in any distress HEENT:atraumatic, normocephalic Chest: B/L clear to auscultation anteriorly CVS:S1S2 regular Abdomen:soft non tender, non distended Extremities:no edema Neurology: Non focal Skin: no rash  Pertinent Labs/Radiology:    Latest Ref Rng & Units 07/03/2023    4:25 AM 07/02/2023    3:24 AM 07/02/2023   12:45 AM  CBC  WBC 4.0 - 10.5 K/uL 11.4  15.7  17.1   Hemoglobin 12.0 - 15.0 g/dL 98.1  19.1  47.8   Hematocrit 36.0 - 46.0 % 43.2  43.1  43.7   Platelets 150 - 400 K/uL 182  191  218     Lab Results  Component Value Date   NA 137 07/03/2023   K 2.8 (L) 07/03/2023   CL 101 07/03/2023   CO2 23 07/03/2023       Assessment/Plan: HSV-2 meningoencephalitis Clinically improved Awake/alert this morning IV acyclovir Await CSF culture results ID consult  RUQ pain Mild RUQ pain for the past several days RUQ ultrasound Supportive care in the interim  DM-2 (A1c 7.0 on 8/20) SSI while inpatient Resume oral hypoglycemic agents on discharge  Recent Labs    07/03/23 0017 07/03/23 0317 07/03/23 0745  GLUCAP 146* 92 99     HTN BP stable but slowly creeping up Resume amlodipine today Follow and add other agents accordingly  History of breast cancer-s/p mastectomy Currently on observation-resume outpatient follow-up with oncology as previously scheduled.  8 mm meningioma left frontal convexity Incidental finding Stable for outpatient follow-up  Obesity: Estimated body mass index is 33.07 kg/m as calculated from the following:   Height as of this encounter: 5' 8.5" (1.74 m).   Weight as of this encounter: 100.1 kg.   Code status:   Code Status: Full Code   DVT Prophylaxis: enoxaparin (LOVENOX) injection 40 mg Start: 07/02/23 1500   Family Communication: Daughter-[id number]-left VM 11/27   Disposition Plan: Status is: Inpatient Remains inpatient appropriate because: Severity of illness   Planned Discharge Destination:Home health   Diet: Diet Order             Diet regular Room service appropriate? Yes; Fluid consistency: Thin  Diet effective now  Antimicrobial agents: Anti-infectives (From admission, onward)    Start     Dose/Rate Route Frequency Ordered Stop   07/02/23 1500  vancomycin (VANCOREADY) IVPB 500 mg/100 mL  Status:  Discontinued       Placed in "Followed by" Linked Group   500 mg 100 mL/hr over 60 Minutes Intravenous  Once 07/02/23 0531 07/02/23 1457   07/02/23 1400  vancomycin (VANCOREADY) IVPB 1500 mg/300 mL  Status:  Discontinued        1,500 mg 150 mL/hr over 120 Minutes Intravenous Every 12 hours 07/02/23  0526 07/02/23 0531   07/02/23 1400  vancomycin (VANCOCIN) IVPB 1000 mg/200 mL premix  Status:  Discontinued       Placed in "Followed by" Linked Group   1,000 mg 200 mL/hr over 60 Minutes Intravenous  Once 07/02/23 0531 07/02/23 1520   07/02/23 1000  acyclovir (ZOVIRAX) 820 mg in dextrose 5 % 250 mL IVPB        10 mg/kg  82.1 kg (Adjusted) 266.4 mL/hr over 60 Minutes Intravenous Every 8 hours 07/02/23 0259     07/02/23 0130  acyclovir (ZOVIRAX) 1,075 mg in dextrose 5 % 250 mL IVPB  Status:  Discontinued        10 mg/kg  107.5 kg 271.5 mL/hr over 60 Minutes Intravenous Every 8 hours 07/02/23 0123 07/02/23 0259   07/02/23 0115  vancomycin (VANCOCIN) IVPB 1000 mg/200 mL premix       Placed in "Followed by" Linked Group   1,000 mg 200 mL/hr over 60 Minutes Intravenous  Once 07/02/23 0102 07/02/23 0433   07/02/23 0115  vancomycin (VANCOCIN) IVPB 1000 mg/200 mL premix       Placed in "Followed by" Linked Group   1,000 mg 200 mL/hr over 60 Minutes Intravenous  Once 07/02/23 0102 07/02/23 0354   07/02/23 0045  vancomycin (VANCOCIN) IVPB 1000 mg/200 mL premix  Status:  Discontinued        1,000 mg 200 mL/hr over 60 Minutes Intravenous  Once 07/02/23 0044 07/02/23 0102   07/02/23 0045  cefTRIAXone (ROCEPHIN) 2 g in sodium chloride 0.9 % 100 mL IVPB  Status:  Discontinued        2 g 200 mL/hr over 30 Minutes Intravenous Every 12 hours 07/02/23 0044 07/02/23 1457   07/02/23 0045  ampicillin (OMNIPEN) 2 g in sodium chloride 0.9 % 100 mL IVPB  Status:  Discontinued        2 g 300 mL/hr over 20 Minutes Intravenous Every 4 hours 07/02/23 0044 07/02/23 1457        MEDICATIONS: Scheduled Meds:  enoxaparin (LOVENOX) injection  40 mg Subcutaneous Q24H   insulin aspart  0-6 Units Subcutaneous Q4H   sodium chloride flush  3 mL Intravenous Q12H   Continuous Infusions:  sodium chloride 125 mL/hr at 07/03/23 0126   acyclovir 820 mg (07/02/23 2301)   PRN Meds:.acetaminophen **OR** acetaminophen,  HYDROmorphone (DILAUDID) injection, labetalol, oxyCODONE, polyethylene glycol, prochlorperazine   I have personally reviewed following labs and imaging studies  LABORATORY DATA: CBC: Recent Labs  Lab 07/01/23 2330 07/02/23 0045 07/02/23 0324 07/03/23 0425  WBC 17.8* 17.1* 15.7* 11.4*  NEUTROABS 14.8* 13.5*  --   --   HGB 15.6* 14.2 14.0 14.4  HCT 48.8* 43.7 43.1 43.2  MCV 91.4 90.3 90.4 88.0  PLT 211 218 191 182    Basic Metabolic Panel: Recent Labs  Lab 07/01/23 2330 07/02/23 0045 07/02/23 0046 07/02/23 0324 07/03/23 0425  NA 134*  --  134*  135 137  K 3.2*  --  3.1* 2.6* 2.8*  CL 100  --  100 101 101  CO2 21*  --  22 22 23   GLUCOSE 156*  --  131* 178* 97  BUN 15  --  14 10 5*  CREATININE 0.87  --  0.75 0.78 0.79  CALCIUM 9.6  --  9.6 8.8* 9.0  MG  --  1.9  --   --   --     GFR: Estimated Creatinine Clearance: 91 mL/min (by C-G formula based on SCr of 0.79 mg/dL).  Liver Function Tests: Recent Labs  Lab 07/01/23 2330 07/02/23 0046  AST 25 23  ALT 23 22  ALKPHOS 77 74  BILITOT 0.7 0.6  PROT 8.4* 7.4  ALBUMIN 4.1 3.8   No results for input(s): "LIPASE", "AMYLASE" in the last 168 hours. Recent Labs  Lab 07/02/23 0046  AMMONIA 23    Coagulation Profile: No results for input(s): "INR", "PROTIME" in the last 168 hours.  Cardiac Enzymes: Recent Labs  Lab 07/02/23 0045  CKTOTAL 309*    BNP (last 3 results) No results for input(s): "PROBNP" in the last 8760 hours.  Lipid Profile: No results for input(s): "CHOL", "HDL", "LDLCALC", "TRIG", "CHOLHDL", "LDLDIRECT" in the last 72 hours.  Thyroid Function Tests: Recent Labs    07/02/23 0046  TSH 1.232    Anemia Panel: Recent Labs    07/02/23 0055  VITAMINB12 835    Urine analysis:    Component Value Date/Time   COLORURINE YELLOW 07/02/2023 0034   APPEARANCEUR CLEAR 07/02/2023 0034   LABSPEC >1.046 (H) 07/02/2023 0034   PHURINE 6.0 07/02/2023 0034   GLUCOSEU NEGATIVE 07/02/2023 0034    GLUCOSEU NEGATIVE 12/11/2022 1103   HGBUR NEGATIVE 07/02/2023 0034   BILIRUBINUR NEGATIVE 07/02/2023 0034   KETONESUR 20 (A) 07/02/2023 0034   PROTEINUR 30 (A) 07/02/2023 0034   UROBILINOGEN 1.0 12/11/2022 1103   NITRITE NEGATIVE 07/02/2023 0034   LEUKOCYTESUR NEGATIVE 07/02/2023 0034    Sepsis Labs: Lactic Acid, Venous    Component Value Date/Time   LATICACIDVEN 1.9 07/02/2023 0342    MICROBIOLOGY: Recent Results (from the past 240 hour(s))  SARS Coronavirus 2 by RT PCR (hospital order, performed in [facility] hospital lab) *cepheid single result test* Anterior Nasal Swab     Status: None   Collection Time: 07/01/23 11:30 PM   Specimen: Anterior Nasal Swab  Result Value Ref Range Status   SARS Coronavirus 2 by RT PCR NEGATIVE NEGATIVE Final    Comment: Performed at [facility], [address] [place], [place] [address]  Culture, blood (routine x 2)     Status: None (Preliminary result)   Collection Time: 07/02/23 12:42 AM   Specimen: BLOOD RIGHT ARM  Result Value Ref Range Status   Specimen Description BLOOD RIGHT ARM  Final   Special Requests   Final    BOTTLES DRAWN AEROBIC AND ANAEROBIC Blood Culture adequate volume   Culture   Final    NO GROWTH 1 DAY Performed at [facility], [address], [place], [place] [address]    Report Status PENDING  Incomplete  Culture, blood (routine x 2)     Status: None (Preliminary result)   Collection Time: 07/02/23 12:42 AM   Specimen: BLOOD LEFT ARM  Result Value Ref Range Status   Specimen Description BLOOD LEFT ARM  Final   Special Requests   Final    BOTTLES DRAWN AEROBIC AND ANAEROBIC Blood  Culture adequate volume   Culture   Final    NO GROWTH 1 DAY Performed at [facility], [address], [place], [place] [address]    Report Status PENDING  Incomplete  CSF culture w Gram Stain     Status: None (Preliminary result)   Collection Time: 07/02/23  1:33 AM   Specimen: CSF; Cerebrospinal Fluid   Result Value Ref Range Status   Specimen Description CSF  Final   Special Requests LP  Final   Gram Stain   Final    CYTOSPIN SMEAR WBC PRESENT, PREDOMINANTLY MONONUCLEAR NO ORGANISMS SEEN Performed at [facility], [address] [place], [place] [address]    Culture PENDING  Incomplete   Report Status PENDING  Incomplete    RADIOLOGY STUDIES/RESULTS: MR BRAIN WO CONTRAST  Result Date: 07/02/2023 CLINICAL DATA:  Headache, fever.  Meningitis/CNS infection suspected EXAM: MRI HEAD WITHOUT CONTRAST TECHNIQUE: Multiplanar, multiecho pulse sequences of the brain and surrounding structures were obtained without intravenous contrast. COMPARISON:  CTA of the head neck from earlier today. FINDINGS: Truncated and motion degraded study. No evidence of infarct or purulence. No hydrocephalus or shift. IMPRESSION: Truncated and motion degraded study. No evidence of infarct or purulence by diffusion imaging. No hydrocephalus or shift. Electronically Signed   By: Tiburcio Pea M.D.   On: 07/02/2023 10:27   EEG adult  Result Date: 07/02/2023 Charlsie Quest, MD     07/02/2023  8:51 AM Patient Name: ZENAIDA KORBEL MRN: [id number] Epilepsy Attending: Charlsie Quest Referring Physician/Provider: Gordy Councilman, MD Date: 07/02/2023 Duration: 23.28 mins Patient history: 62 yo F with ams getting eeg to evaluate for seizure Level of alertness: Awake AEDs during EEG study: None Technical aspects: This EEG study was done with scalp electrodes positioned according to the 10-20 International system of electrode placement. Electrical activity was reviewed with band pass filter of 1-70Hz , sensitivity of 7 uV/mm, display speed of 86mm/sec with a 60Hz  notched filter applied as appropriate. EEG data were recorded continuously and digitally stored.  Video monitoring was available and reviewed as appropriate. Description: The posterior dominant rhythm consists of 7 Hz activity of moderate voltage (25-35 uV)  seen predominantly in posterior head regions, symmetric and reactive to eye opening and eye closing. EEG showed continuous generalized 5 to 7 Hz theta slowing. Hyperventilation and photic stimulation were not performed.   ABNORMALITY - Continuous slow, generalized - Background slow IMPRESSION: This study is suggestive of mild diffuse encephalopathy. No seizures or epileptiform discharges were seen throughout the recording. Priyanka Annabelle Harman   CT ANGIO HEAD NECK W WO CM  Result Date: 07/02/2023 CLINICAL DATA:  Follow-up examination for stroke, altered mental status. EXAM: CT ANGIOGRAPHY HEAD AND NECK WITH AND WITHOUT CONTRAST TECHNIQUE: Multidetector CT imaging of the head and neck was performed using the standard protocol during bolus administration of intravenous contrast. Multiplanar CT image reconstructions and MIPs were obtained to evaluate the vascular anatomy. Carotid stenosis measurements (when applicable) are obtained utilizing NASCET criteria, using the distal internal carotid diameter as the denominator. RADIATION DOSE REDUCTION: This exam was performed according to the departmental dose-optimization program which includes automated exposure control, adjustment of the mA and/or kV according to patient size and/or use of iterative reconstruction technique. CONTRAST:  75mL OMNIPAQUE IOHEXOL 350 MG/ML SOLN COMPARISON:  CT from earlier the same day. FINDINGS: CTA NECK FINDINGS Aortic arch: Standard branching. Imaged portion shows no evidence of aneurysm or dissection. No significant stenosis of the  major arch vessel origins. Right carotid system: Right common and internal carotid arteries are patent without dissection. Atheromatous change about the right carotid bulb without hemodynamically significant greater than 50% stenosis. Superimposed small penetrating plaque noted at the posterior aspect of the proximal cervical right ICA (series 6, image 183). Left carotid system: Left common and internal carotid  arteries are patent without dissection. Mild atheromatous change about the left carotid bulb without hemodynamically significant stenosis. Vertebral arteries: Both vertebral arteries arise from the subclavian arteries. Left vertebral artery dominant. Vertebral arteries patent without stenosis or dissection. Skeleton: No worrisome osseous lesions. Moderate spondylosis present at C5-6 and C6-7. Other neck: No other acute finding. Upper chest: No other acute finding. Review of the MIP images confirms the above findings CTA HEAD FINDINGS Anterior circulation: Mild atheromatous change about the carotid siphons without hemodynamically significant stenosis. A1 segments patent bilaterally. Normal anterior communicating artery complex. Anterior cerebral arteries patent without stenosis. No M1 stenosis or occlusion. Distal MCA branches perfused and symmetric. Posterior circulation: Both V4 segments patent without stenosis. Both PICA patent. Basilar patent without stenosis. Superior cerebral arteries patent bilaterally. Left PCA supplied via the basilar. Fetal type origin of the right PCA. Both PCAs patent to their distal aspects without significant stenosis. Venous sinuses: Not well assessed due to timing of the contrast bolus. Anatomic variants: As above. No aneurysm. 8 mm presumed meningioma noted overlying the left frontal convexity (series 6, image 37). No visible associated edema. Review of the MIP images confirms the above findings IMPRESSION: 1. Negative CTA for large vessel occlusion or other emergent finding. 2. Mild atheromatous change about the carotid bifurcations and carotid siphons without hemodynamically significant stenosis. Superimposed small penetrating plaque at the right carotid bulb as above. 3. 8 mm presumed meningioma overlying the left frontal convexity. Electronically Signed   By: Rise Mu M.D.   On: 07/02/2023 03:33   DG Abd Portable 1 View  Result Date: 07/02/2023 CLINICAL DATA:   MRI screening EXAM: PORTABLE ABDOMEN - 1 VIEW COMPARISON:  None Available. FINDINGS: The bowel gas pattern is normal. No radio-opaque calculi or other significant radiographic abnormality are seen. No retained metallic foreign body identified within the visualized abdomen. IMPRESSION: 1. No retained metallic foreign body. Electronically Signed   By: Helyn Numbers M.D.   On: 07/02/2023 02:24   CT Head Wo Contrast  Result Date: 07/02/2023 CLINICAL DATA:  Mental status change EXAM: CT HEAD WITHOUT CONTRAST TECHNIQUE: Contiguous axial images were obtained from the base of the skull through the vertex without intravenous contrast. RADIATION DOSE REDUCTION: This exam was performed according to the departmental dose-optimization program which includes automated exposure control, adjustment of the mA and/or kV according to patient size and/or use of iterative reconstruction technique. COMPARISON:  Head CT 05/22/2018, head CT 10/23/2013. FINDINGS: Brain: There is a stable hyperdense extra-axial mass in the left frontal region measuring 9 by 6 by 6 mm. There is no acute intracranial hemorrhage, mass effect, midline shift or extra-axial fluid collection. No acute infarct identified. Vascular: No hyperdense vessel or unexpected calcification. Skull: Normal. Negative for fracture or focal lesion. Sinuses/Orbits: No acute finding. Other: None. IMPRESSION: 1. No acute intracranial process. 2. Stable 9 mm extra-axial mass in the left frontal region, likely a meningioma. Electronically Signed   By: Darliss Cheney M.D.   On: 07/02/2023 00:34   DG Chest Port 1 View  Result Date: 07/02/2023 CLINICAL DATA:  Altered mental status. EXAM: PORTABLE CHEST 1 VIEW COMPARISON:  05/22/2018 FINDINGS: Stable cardiomediastinal  silhouette. Aortic atherosclerotic calcification. No focal consolidation, pleural effusion, or pneumothorax. No displaced rib fractures. Surgical clips in the axilla bilaterally. IMPRESSION: No acute cardiopulmonary  disease. Electronically Signed   By: Minerva Fester M.D.   On: 07/02/2023 00:13     LOS: 1 day   Jeoffrey Massed, MD  [facility]    To contact the attending provider between 7A-7P or the covering provider during after hours 7P-7A, please log into the web site [email or web address] and access using universal [place] password for that web site. If you do not have the password, please call the hospital operator.  07/03/2023, 9:20 AM

## 2023-07-03 NOTE — Plan of Care (Signed)
Patient still getting tests done and iv antibiotics

## 2023-07-03 NOTE — Progress Notes (Signed)
IVT consult placed for second PIV. Patient is receiving continuous fluids, potassium runs and acyclovir. Spoke with Will, pharmacist to clarify if medications can run at the same time thru current USGPIV. He confirmed that medications are compatible. Recommended not giving labetolol or compazine IV while patient is receiving Acyclovir but otherwise can continue to give medications at same time. Primary RN notified of conversation; pending response.  Marieta Markov Loyola Mast, RN

## 2023-07-03 NOTE — Progress Notes (Signed)
Four doses of iv potassium given as ordered mar  still shows another dose due but only four doses ordered. Spoke with pharmacy about reason why still showing due. No resolution. Had problems with potassium bags scanning today with that particular med scanner. Had to infuse potassium slow due to complaints of pain at iv site per patient.

## 2023-07-04 ENCOUNTER — Inpatient Hospital Stay (HOSPITAL_COMMUNITY): Payer: 59

## 2023-07-04 DIAGNOSIS — I10 Essential (primary) hypertension: Secondary | ICD-10-CM | POA: Diagnosis not present

## 2023-07-04 DIAGNOSIS — R6510 Systemic inflammatory response syndrome (SIRS) of non-infectious origin without acute organ dysfunction: Secondary | ICD-10-CM | POA: Diagnosis not present

## 2023-07-04 DIAGNOSIS — E119 Type 2 diabetes mellitus without complications: Secondary | ICD-10-CM | POA: Diagnosis not present

## 2023-07-04 DIAGNOSIS — N200 Calculus of kidney: Secondary | ICD-10-CM

## 2023-07-04 DIAGNOSIS — G9340 Encephalopathy, unspecified: Secondary | ICD-10-CM | POA: Diagnosis not present

## 2023-07-04 DIAGNOSIS — B004 Herpesviral encephalitis: Secondary | ICD-10-CM | POA: Diagnosis not present

## 2023-07-04 LAB — BASIC METABOLIC PANEL
Anion gap: 11 (ref 5–15)
Anion gap: 11 (ref 5–15)
BUN: 16 mg/dL (ref 8–23)
BUN: 19 mg/dL (ref 8–23)
CO2: 22 mmol/L (ref 22–32)
CO2: 19 mmol/L — ABNORMAL LOW (ref 22–32)
Calcium: 8.8 mg/dL — ABNORMAL LOW (ref 8.9–10.3)
Calcium: 9.1 mg/dL (ref 8.9–10.3)
Chloride: 101 mmol/L (ref 98–111)
Chloride: 110 mmol/L (ref 98–111)
Creatinine, Ser: 1.8 mg/dL — ABNORMAL HIGH (ref 0.44–1.00)
Creatinine, Ser: 2 mg/dL — ABNORMAL HIGH (ref 0.44–1.00)
GFR, Estimated: 31 mL/min — ABNORMAL LOW (ref >60)
GFR, Estimated: 28 mL/min — ABNORMAL LOW (ref >60)
Glucose, Bld: 132 mg/dL — ABNORMAL HIGH (ref 70–99)
Glucose, Bld: 92 mg/dL (ref 70–99)
Potassium: 2.7 mmol/L — CL (ref 3.5–5.1)
Potassium: 4.3 mmol/L (ref 3.5–5.1)
Sodium: 134 mmol/L — ABNORMAL LOW (ref 135–145)
Sodium: 140 mmol/L (ref 135–145)

## 2023-07-04 LAB — GLUCOSE, CAPILLARY
Glucose-Capillary: 114 mg/dL — ABNORMAL HIGH (ref 70–99)
Glucose-Capillary: 113 mg/dL — ABNORMAL HIGH (ref 70–99)
Glucose-Capillary: 135 mg/dL — ABNORMAL HIGH (ref 70–99)
Glucose-Capillary: 139 mg/dL — ABNORMAL HIGH (ref 70–99)
Glucose-Capillary: 98 mg/dL (ref 70–99)

## 2023-07-04 LAB — CBC
HCT: 39.6 % (ref 36.0–46.0)
Hemoglobin: 13.2 g/dL (ref 12.0–15.0)
MCH: 29.5 pg (ref 26.0–34.0)
MCHC: 33.3 g/dL (ref 30.0–36.0)
MCV: 88.4 fL (ref 80.0–100.0)
Platelets: 185 10*3/uL (ref 150–400)
RBC: 4.48 MIL/uL (ref 3.87–5.11)
RDW: 14.1 % (ref 11.5–15.5)
WBC: 14.8 10*3/uL — ABNORMAL HIGH (ref 4.0–10.5)
nRBC: 0 % (ref 0.0–0.2)

## 2023-07-04 LAB — MAGNESIUM: Magnesium: 1.7 mg/dL (ref 1.7–2.4)

## 2023-07-04 MED ORDER — SODIUM CHLORIDE 0.9 % IV SOLN
2.0000 g | INTRAVENOUS | Status: DC
  Administered 2023-07-04 – 2023-07-06 (×3): 2 g via INTRAVENOUS
  Filled 2023-07-04 (×3): qty 20

## 2023-07-04 MED ORDER — POTASSIUM CHLORIDE 20 MEQ PO PACK
40.0000 meq | PACK | Freq: Once | ORAL | Status: AC
  Administered 2023-07-04: 40 meq via ORAL
  Filled 2023-07-04: qty 2

## 2023-07-04 MED ORDER — POTASSIUM CHLORIDE 10 MEQ/100ML IV SOLN
10.0000 meq | INTRAVENOUS | Status: AC
  Administered 2023-07-04 (×3): 10 meq via INTRAVENOUS
  Filled 2023-07-04: qty 100

## 2023-07-04 MED ORDER — POTASSIUM CHLORIDE 20 MEQ PO PACK
40.0000 meq | PACK | Freq: Once | ORAL | Status: DC
  Filled 2023-07-04: qty 2

## 2023-07-04 MED ORDER — DEXTROSE 5 % IV SOLN
10.0000 mg/kg | Freq: Two times a day (BID) | INTRAVENOUS | Status: DC
  Administered 2023-07-04 – 2023-07-06 (×4): 820 mg via INTRAVENOUS
  Filled 2023-07-04 (×6): qty 16.4

## 2023-07-04 MED ORDER — METRONIDAZOLE 500 MG/100ML IV SOLN
500.0000 mg | Freq: Two times a day (BID) | INTRAVENOUS | Status: DC
  Administered 2023-07-04 – 2023-07-06 (×7): 500 mg via INTRAVENOUS
  Filled 2023-07-04 (×6): qty 100

## 2023-07-04 MED ORDER — POTASSIUM CHLORIDE 10 MEQ/100ML IV SOLN: 10.0000 meq | INTRAVENOUS | Status: DC

## 2023-07-04 MED ORDER — SODIUM CHLORIDE 0.9 % IV SOLN: INTRAVENOUS | Status: AC

## 2023-07-04 NOTE — Plan of Care (Signed)
Patient has increased abdominal discomfort and requiring more meds for pain and comfort

## 2023-07-04 NOTE — Plan of Care (Signed)
  Problem: Education: Goal: Ability to describe self-care measures that may prevent or decrease complications (Diabetes Survival Skills Education) will improve Outcome: Progressing Goal: Individualized Educational Video(s) Outcome: Progressing   Problem: Coping: Goal: Ability to adjust to condition or change in health will improve Outcome: Progressing   Problem: Fluid Volume: Goal: Ability to maintain a balanced intake and output will improve Outcome: Progressing   Problem: Nutritional: Goal: Progress toward achieving an optimal weight will improve Outcome: Progressing   Problem: Clinical Measurements: Goal: Signs and symptoms of infection will decrease Outcome: Progressing   Problem: Respiratory: Goal: Ability to maintain adequate ventilation will improve Outcome: Progressing   Problem: Education: Goal: Knowledge of General Education information will improve Description: Including pain rating scale, medication(s)/side effects and non-pharmacologic comfort measures Outcome: Progressing   Problem: Clinical Measurements: Goal: Will remain free from infection Outcome: Progressing Goal: Respiratory complications will improve Outcome: Progressing   Problem: Activity: Goal: Risk for activity intolerance will decrease Outcome: Progressing   Problem: Coping: Goal: Level of anxiety will decrease Outcome: Progressing

## 2023-07-04 NOTE — Progress Notes (Signed)
[facility] night cross cover note:   I was notified by RN of the patient's low potassium level 2.7 this morning.  I subsequently ordered Kcl 40 meq PO x 1 dose.      Newton Pigg, DO Hospitalist

## 2023-07-04 NOTE — Consult Note (Signed)
Reason for Consult:abdominal pain Referring Physician: Ghimrie MD  Margaret [name] is an 62 y.o. female.  HPI: 62 YO female admitted 2 days ago for MS changes and encephalopathy. That has improved but now with RUQ pain. U/S showed gallstones and now with pain. Complains of RUQ pain currently ,10/10   Past Medical History:  Diagnosis Date   Allergy    Anxiety    Arthritis    Bilateral breast cancer (HCC)    Breast cancer (HCC)    Breast cancer of upper-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) 04/21/2015   Depression    Diabetes mellitus without complication (HCC)    GERD (gastroesophageal reflux disease)    Hypertension     Past Surgical History:  Procedure Laterality Date   ABDOMINAL HYSTERECTOMY  1990   MASTECTOMY MODIFIED RADICAL Left 05/09/2015   MASTECTOMY MODIFIED RADICAL Left 05/09/2015   Procedure: LEFT MODIFIED RADICAL MASTETCTOMY;  Surgeon: Chevis Pretty III, MD;  Location: [facility] OR;  Service: General;  Laterality: Left;   MASTECTOMY W/ SENTINEL NODE BIOPSY Right    MASTECTOMY W/ SENTINEL NODE BIOPSY Right 05/09/2015   Procedure: RIGHT MASTECTOMY WITH RIGHT AXILLARY SENTINEL LYMPH NODE BIOPSY;  Surgeon: Chevis Pretty III, MD;  Location: [facility] OR;  Service: General;  Laterality: Right;   PORTACATH PLACEMENT Right 09/01/2015   Procedure: INSERTION PORT-A-CATH;  Surgeon: Chevis Pretty III, MD;  Location: [facility];  Service: General;  Laterality: Right;   TUBAL LIGATION  1984    Family History  Problem Relation Age of Onset   Hypertension Mother    Diabetes Mother    Colon cancer Neg Hx    Esophageal cancer Neg Hx    Rectal cancer Neg Hx    Stomach cancer Neg Hx     Social History:  reports that she quit smoking about 4 years ago. Her smoking use included cigarettes. She started smoking about 32 years ago. She has a 2.8 pack-year smoking history. She has never used smokeless tobacco. She reports that she does not currently use alcohol. She reports that she does not use  drugs.  Allergies:  Allergies  Allergen Reactions   Banana Hives    Tongue itching   Latex Itching and Other (See Comments)    burning   Peanuts [Peanut Oil] Hives    Patient is allergic to all tree nuts   Wheat Hives    Medications: I have reviewed the patient's current medications.  Results for orders placed or performed during the hospital encounter of 07/01/23 (from the past 48 hour(s))  CBG monitoring, ED     Status: Abnormal   Collection Time: 07/02/23 12:09 PM  Result Value Ref Range   Glucose-Capillary 149 (H) 70 - 99 mg/dL    Comment: Glucose reference range applies only to samples taken after fasting for at least 8 hours.  Glucose, capillary     Status: None   Collection Time: 07/02/23  4:34 PM  Result Value Ref Range   Glucose-Capillary 87 70 - 99 mg/dL    Comment: Glucose reference range applies only to samples taken after fasting for at least 8 hours.  Glucose, capillary     Status: Abnormal   Collection Time: 07/02/23  8:44 PM  Result Value Ref Range   Glucose-Capillary 108 (H) 70 - 99 mg/dL    Comment: Glucose reference range applies only to samples taken after fasting for at least 8 hours.  Glucose, capillary     Status: Abnormal   Collection  Time: 07/03/23 12:17 AM  Result Value Ref Range   Glucose-Capillary 146 (H) 70 - 99 mg/dL    Comment: Glucose reference range applies only to samples taken after fasting for at least 8 hours.  Glucose, capillary     Status: None   Collection Time: 07/03/23  3:17 AM  Result Value Ref Range   Glucose-Capillary 92 70 - 99 mg/dL    Comment: Glucose reference range applies only to samples taken after fasting for at least 8 hours.  Magnesium     Status: Abnormal   Collection Time: 07/03/23  4:22 AM  Result Value Ref Range   Magnesium 1.6 (L) 1.7 - 2.4 mg/dL    Comment: Performed at [facility], [address], [place], [place] [address]  Basic metabolic panel     Status: Abnormal   Collection Time: 07/03/23  4:25  AM  Result Value Ref Range   Sodium 137 135 - 145 mmol/L   Potassium 2.8 (L) 3.5 - 5.1 mmol/L   Chloride 101 98 - 111 mmol/L   CO2 23 22 - 32 mmol/L   Glucose, Bld 97 70 - 99 mg/dL    Comment: Glucose reference range applies only to samples taken after fasting for at least 8 hours.   BUN 5 (L) 8 - 23 mg/dL   Creatinine, Ser 2.13 0.44 - 1.00 mg/dL   Calcium 9.0 8.9 - 08.6 mg/dL   GFR, Estimated >57 >84 mL/min    Comment: (NOTE) Calculated using the CKD-EPI Creatinine Equation (2021)    Anion gap 13 5 - 15    Comment: Performed at [facility], [address] [place], [place] [address]  CBC     Status: Abnormal   Collection Time: 07/03/23  4:25 AM  Result Value Ref Range   WBC 11.4 (H) 4.0 - 10.5 K/uL   RBC 4.91 3.87 - 5.11 MIL/uL   Hemoglobin 14.4 12.0 - 15.0 g/dL   HCT 52.8 41.3 - 24.4 %   MCV 88.0 80.0 - 100.0 fL   MCH 29.3 26.0 - 34.0 pg   MCHC 33.3 30.0 - 36.0 g/dL   RDW 01.0 27.2 - 53.6 %   Platelets 182 150 - 400 K/uL   nRBC 0.0 0.0 - 0.2 %    Comment: Performed at [facility], [address] [place], [place] [address]  Glucose, capillary     Status: None   Collection Time: 07/03/23  7:45 AM  Result Value Ref Range   Glucose-Capillary 99 70 - 99 mg/dL    Comment: Glucose reference range applies only to samples taken after fasting for at least 8 hours.  Glucose, capillary     Status: Abnormal   Collection Time: 07/03/23 12:18 PM  Result Value Ref Range   Glucose-Capillary 109 (H) 70 - 99 mg/dL    Comment: Glucose reference range applies only to samples taken after fasting for at least 8 hours.  Glucose, capillary     Status: Abnormal   Collection Time: 07/03/23  4:24 PM  Result Value Ref Range   Glucose-Capillary 103 (H) 70 - 99 mg/dL    Comment: Glucose reference range applies only to samples taken after fasting for at least 8 hours.  Glucose, capillary     Status: Abnormal   Collection Time: 07/03/23  8:30 PM  Result Value Ref Range    Glucose-Capillary 119 (H) 70 - 99 mg/dL    Comment: Glucose reference range applies only to samples taken  after fasting for at least 8 hours.  Glucose, capillary     Status: Abnormal   Collection Time: 07/03/23 11:38 PM  Result Value Ref Range   Glucose-Capillary 116 (H) 70 - 99 mg/dL    Comment: Glucose reference range applies only to samples taken after fasting for at least 8 hours.  Glucose, capillary     Status: Abnormal   Collection Time: 07/04/23  3:55 AM  Result Value Ref Range   Glucose-Capillary 114 (H) 70 - 99 mg/dL    Comment: Glucose reference range applies only to samples taken after fasting for at least 8 hours.  Basic metabolic panel     Status: Abnormal   Collection Time: 07/04/23  4:24 AM  Result Value Ref Range   Sodium 134 (L) 135 - 145 mmol/L   Potassium 2.7 (LL) 3.5 - 5.1 mmol/L    Comment: CRITICAL RESULT CALLED TO, READ BACK BY AND VERIFIED WITH D.MALLAND RN 0502 07/04/2023 BY G.GANADEN   Chloride 101 98 - 111 mmol/L   CO2 22 22 - 32 mmol/L   Glucose, Bld 132 (H) 70 - 99 mg/dL    Comment: Glucose reference range applies only to samples taken after fasting for at least 8 hours.   BUN 16 8 - 23 mg/dL   Creatinine, Ser 5.28 (H) 0.44 - 1.00 mg/dL    Comment: DELTA CHECK NOTED   Calcium 8.8 (L) 8.9 - 10.3 mg/dL   GFR, Estimated 31 (L) >60 mL/min    Comment: (NOTE) Calculated using the CKD-EPI Creatinine Equation (2021)    Anion gap 11 5 - 15    Comment: Performed at [facility], [address], [place], [place] [address]  CBC     Status: Abnormal   Collection Time: 07/04/23  4:24 AM  Result Value Ref Range   WBC 14.8 (H) 4.0 - 10.5 K/uL   RBC 4.48 3.87 - 5.11 MIL/uL   Hemoglobin 13.2 12.0 - 15.0 g/dL   HCT 40.1 02.7 - 25.3 %   MCV 88.4 80.0 - 100.0 fL   MCH 29.5 26.0 - 34.0 pg   MCHC 33.3 30.0 - 36.0 g/dL   RDW 66.4 40.3 - 47.4 %   Platelets 185 150 - 400 K/uL   nRBC 0.0 0.0 - 0.2 %    Comment: Performed at [facility], [address], [place], [place] [address]  Glucose, capillary     Status: Abnormal   Collection Time: 07/04/23  8:16 AM  Result Value Ref Range   Glucose-Capillary 113 (H) 70 - 99 mg/dL    Comment: Glucose reference range applies only to samples taken after fasting for at least 8 hours.    US Abdomen Limited RUQ (LIVER/GB)  Result Date: 07/03/2023 CLINICAL DATA:  Right upper quadrant pain. EXAM: ULTRASOUND ABDOMEN LIMITED RIGHT UPPER QUADRANT COMPARISON:  PET-CT scan 05/24/2015. FINDINGS: Gallbladder: Dilated gallbladder. Borderline wall thickening of 3 mm. No adjacent fluid. Layering sludge and probable stones with some shadowing. No reported sonographic Murphy's sign Common bile duct: Diameter: 3 mm Liver: No focal lesion identified. Within normal limits in parenchymal echogenicity. Portal vein is patent on color Doppler imaging with normal direction of blood flow towards the liver. Other: None. IMPRESSION: Dilated gallbladder with layering sludge and probable stones. Borderline wall thickening but no adjacent fluid or reported sonographic Murphy's sign. No biliary ductal dilatation. If there is further concern of acute gallbladder pathology, HIDA scan could be of some benefit as clinically appropriate Electronically  Signed   By: Karen Kays M.D.   On: 07/03/2023 12:27   MR BRAIN WO CONTRAST  Result Date: 07/02/2023 CLINICAL DATA:  Headache, fever.  Meningitis/CNS infection suspected EXAM: MRI HEAD WITHOUT CONTRAST TECHNIQUE: Multiplanar, multiecho pulse sequences of the brain and surrounding structures were obtained without intravenous contrast. COMPARISON:  CTA of the head neck from earlier today. FINDINGS: Truncated and motion degraded study. No evidence of infarct or purulence. No hydrocephalus or shift. IMPRESSION: Truncated and motion degraded study. No evidence of infarct or purulence by diffusion imaging. No hydrocephalus or shift. Electronically Signed   By: Tiburcio Pea M.D.   On: 07/02/2023 10:27     Review of Systems  Constitutional:  Positive for fatigue.  Gastrointestinal:  Positive for abdominal pain.  Neurological:  Positive for speech difficulty and weakness.  Psychiatric/Behavioral:  Positive for confusion.    Blood pressure (!) 160/99, pulse 86, temperature 98.5 F (36.9 C), temperature source Oral, resp. rate 20, height 5' 8.5" (1.74 m), weight 103.3 kg, SpO2 93%. Physical Exam HENT:     Head: Normocephalic.  Cardiovascular:     Rate and Rhythm: Normal rate.  Pulmonary:     Effort: Pulmonary effort is normal.  Abdominal:     Tenderness: There is abdominal tenderness in the right upper quadrant. Positive signs include Murphy's sign.  Skin:    General: Skin is warm.  Neurological:     Mental Status: She is alert. She is disoriented.  Psychiatric:        Mood and Affect: Mood normal.     Assessment/Plan: Acute cholecystitis  Will need lap chole once cleared by neurology/ medicine for GA Will follow up on timing but ok to have clears today since will not go to OR today but possible Friday/sat Recommend zosyn   Maisie Fus A Jaiceon Collister 07/04/2023, 9:51 AM     High complexity

## 2023-07-04 NOTE — Progress Notes (Signed)
Pharmacy Antibiotic Note  Margaret [name] is a 62 y.o. 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  Pharmacy is consulted for acyclovir dosing and to start ceftriaxone and flagyl.  Patient SCr jumped today from 0.79 >> 1.8. Per RN, the IVF was interrupted due to other medications that had to be given and not having another IV line. Have consulted IV team for additional line.   Plan: Adjust Acyclovir 820mg  IV to every 12 hour (dosed per 10mg /kg adj body weight) Normal saline @125mL /hr to prevent nephrotoxicity with acyclovir   Start ceftriaxone 2 grams IV every 24 hours Start metronidazole 500 mg IV every 12 hours Monitor clinical progress, cultures/sensitivities, renal function, abx plan   Height: 5' 8.5" (174 cm) Weight: 103.3 kg (227 lb 11.8 oz) IBW/kg (Calculated) : 65.05  Temp (24hrs), Avg:98.4 F (36.9 C), Min:98 F (36.7 C), Max:98.8 F (37.1 C)  Recent Labs  Lab 07/01/23 2330 07/02/23 0045 07/02/23 0046 07/02/23 0052 07/02/23 0324 07/02/23 0342 07/03/23 0425 07/04/23 0424  WBC 17.8* 17.1*  --   --  15.7*  --  11.4* 14.8*  CREATININE 0.87  --  0.75  --  0.78  --  0.79 1.80*  LATICACIDVEN  --   --   --  3.0* 0.9 1.9  --   --     Estimated Creatinine Clearance: 41.1 mL/min (A) (by C-G formula based on SCr of 1.8 mg/dL (H)).    Allergies  Allergen Reactions   Banana Hives    Tongue itching   Latex Itching and Other (See Comments)    burning   Peanuts [Peanut Oil] Hives    Patient is allergic to all tree nuts   Wheat Hives    Antimicrobials this admission: Acyclovir 11/26 >> Flagyl 11/28 >> Ceftriaxone 11/26, 11/28 >> Ampicillin 11/26 >11/26 Vancomycin 11/26 > 11/26  Microbiology results: 11/26 Bcx: ngtd 11/26 CSF cx: ngtd 11/26 CSF rapid panel: HSV 2  11/26 resp panel: sent 11/25 covid: neg   Thank you for allowing pharmacy to be a part of this patient's care.   Signe Colt,  PharmD 07/04/2023 8:59 AM  **Pharmacist phone directory can be found on [email or web address] listed under [facility]**

## 2023-07-04 NOTE — Progress Notes (Signed)
PROGRESS NOTE        PATIENT DETAILS Name: Margaret [name] Age: 62 y.o. Sex: female Date of Birth: [birth year] Admit Date: 07/01/2023 Admitting Physician Briscoe Deutscher, MD ONG:[id number], Austin Miles, MD  Brief Summary: Patient is a 62 y.o.  female with history of HTN, DM-2, breast cancer-s/p bilateral mastectomy 2016-presented with headache/confusion-underwent LP-CSF positive for HSV-2.  See below for further details.  Significant events: 11/25>> admit to [facility]  Significant studies: 11/25>> CXR: No PNA 11/26>> MRI brain: No evidence of infarct 11/26>> CTA head/neck: No LVO/hemodynamically significant stenosis 11/26>> Spot EEG: Negative for seizures 11/26>> CSF-tube 4-RBC 175-WBC 260-98% lymphocytes, protein 191-HSV II positive on biofire panel 11/27>> RUQ ultrasound: Dilated gallbladder/stones.  Significant microbiology data: 11/26>> COVID PCR: Negative 11/26>> blood culture: No growth 11/26>> CSF culture: Pending  Procedures: 11/26>> lumbar puncture  Consults: Neurology [facility]  Subjective: Worsening RUQ pain-appears uncomfortable.  Objective: Vitals: Blood pressure (!) 160/99, pulse 86, temperature 98.5 F (36.9 C), temperature source Oral, resp. rate 20, height 5' 8.5" (1.74 m), weight 103.3 kg, SpO2 93%.   Exam: Gen Exam:Alert awake-not in any distress HEENT:atraumatic, normocephalic Chest: B/L clear to auscultation anteriorly CVS:S1S2 regular Abdomen: Soft-but tenderness in the RUQ area with positive Murphy sign this morning.   Extremities:no edema Neurology: Non focal Skin: no rash  Pertinent Labs/Radiology:    Latest Ref Rng & Units 07/04/2023    4:24 AM 07/03/2023    4:25 AM 07/02/2023    3:24 AM  CBC  WBC 4.0 - 10.5 K/uL 14.8  11.4  15.7   Hemoglobin 12.0 - 15.0 g/dL 24.4  01.0  27.2   Hematocrit 36.0 - 46.0 % 39.6  43.2  43.1   Platelets 150 - 400 K/uL 185  182  191     Lab Results  Component Value Date   NA 134 (L)  07/04/2023   K 2.7 (LL) 07/04/2023   CL 101 07/04/2023   CO2 22 07/04/2023      Assessment/Plan: HSV-2 meningoencephalitis Improving some confusion lingers but overall much better IV acyclovir Discussed with ID MD Dr. Thedore Mins 11/27-recommends a total of 10 days of antimicrobial therapy-initially with IV acyclovir but could potentially be converted to Valtrex when she is much better.  Acute calculus cholecystitis Has had mild RUQ pain for the past several days-markedly worse overnight RUQ ultrasound 11/27 positive for gallstones/gallbladder thickening-tender RUQ with positive Murphy sign today-clinically has acute calculus cholecystitis IV Rocephin/Flagyl General Surgery consulted this morning-okay for clear liquids, cholecystectomy planned Friday or Saturday.  AKI Combination of hemodynamically mediated kidney injury due to worsening cholecystitis and possibly from acyclovir Continue to hydrate with IVF Treat underlying cholecystitis Avoid nephrotoxic agents Renal ultrasound to make sure no obstructive pathology Frequent bladder scans Repeat electrolytes daily  Hypokalemia Likely from acyclovir Pharmacy replete per protocol.  DM-2 (A1c 7.0 on 8/20) SSI while inpatient Resume oral hypoglycemic agents on discharge  Recent Labs    07/03/23 2338 07/04/23 0355 07/04/23 0816  GLUCAP 116* 114* 113*     HTN BP stable  Amlbut slowly creeping up Resume amlodipine today Follow and add other agents accordingly  History of breast cancer-s/p mastectomy Currently on observation-resume outpatient follow-up with oncology as previously scheduled.  8 mm meningioma left frontal convexity Incidental finding Stable for outpatient follow-up  Obesity: Estimated body mass index is 34.12 kg/m as calculated from the  following:   Height as of this encounter: 5' 8.5" (1.74 m).   Weight as of this encounter: 103.3 kg.   Code status:   Code Status: Full Code   DVT  Prophylaxis: enoxaparin (LOVENOX) injection 40 mg Start: 07/02/23 1500   Family Communication: Daughter-[phone]-updated 11/28   Disposition Plan: Status is: Inpatient Remains inpatient appropriate because: Severity of illness   Planned Discharge Destination:Home health   Diet: Diet Order             Diet NPO time specified Except for: Sips with Meds  Diet effective now                     Antimicrobial agents: Anti-infectives (From admission, onward)    Start     Dose/Rate Route Frequency Ordered Stop   07/04/23 1200  acyclovir (ZOVIRAX) 820 mg in dextrose 5 % 250 mL IVPB        10 mg/kg  82.1 kg (Adjusted) 266.4 mL/hr over 60 Minutes Intravenous Every 12 hours 07/04/23 0758     07/04/23 0945  cefTRIAXone (ROCEPHIN) 2 g in sodium chloride 0.9 % 100 mL IVPB        2 g 200 mL/hr over 30 Minutes Intravenous Every 24 hours 07/04/23 0857     07/04/23 0945  metroNIDAZOLE (FLAGYL) IVPB 500 mg        500 mg 100 mL/hr over 60 Minutes Intravenous Every 12 hours 07/04/23 0858     07/02/23 1500  vancomycin (VANCOREADY) IVPB 500 mg/100 mL  Status:  Discontinued       Placed in "Followed by" Linked Group   500 mg 100 mL/hr over 60 Minutes Intravenous  Once 07/02/23 0531 07/02/23 1457   07/02/23 1400  vancomycin (VANCOREADY) IVPB 1500 mg/300 mL  Status:  Discontinued        1,500 mg 150 mL/hr over 120 Minutes Intravenous Every 12 hours 07/02/23 0526 07/02/23 0531   07/02/23 1400  vancomycin (VANCOCIN) IVPB 1000 mg/200 mL premix  Status:  Discontinued       Placed in "Followed by" Linked Group   1,000 mg 200 mL/hr over 60 Minutes Intravenous  Once 07/02/23 0531 07/02/23 1520   07/02/23 1000  acyclovir (ZOVIRAX) 820 mg in dextrose 5 % 250 mL IVPB  Status:  Discontinued        10 mg/kg  82.1 kg (Adjusted) 266.4 mL/hr over 60 Minutes Intravenous Every 8 hours 07/02/23 0259 07/04/23 0758   07/02/23 0130  acyclovir (ZOVIRAX) 1,075 mg in dextrose 5 % 250 mL IVPB   Status:  Discontinued        10 mg/kg  107.5 kg 271.5 mL/hr over 60 Minutes Intravenous Every 8 hours 07/02/23 0123 07/02/23 0259   07/02/23 0115  vancomycin (VANCOCIN) IVPB 1000 mg/200 mL premix       Placed in "Followed by" Linked Group   1,000 mg 200 mL/hr over 60 Minutes Intravenous  Once 07/02/23 0102 07/02/23 0433   07/02/23 0115  vancomycin (VANCOCIN) IVPB 1000 mg/200 mL premix       Placed in "Followed by" Linked Group   1,000 mg 200 mL/hr over 60 Minutes Intravenous  Once 07/02/23 0102 07/02/23 0354   07/02/23 0045  vancomycin (VANCOCIN) IVPB 1000 mg/200 mL premix  Status:  Discontinued        1,000 mg 200 mL/hr over 60 Minutes Intravenous  Once 07/02/23 0044 07/02/23 0102   07/02/23 0045  cefTRIAXone (ROCEPHIN) 2 g in sodium chloride 0.9 % 100  mL IVPB  Status:  Discontinued        2 g 200 mL/hr over 30 Minutes Intravenous Every 12 hours 07/02/23 0044 07/02/23 1457   07/02/23 0045  ampicillin (OMNIPEN) 2 g in sodium chloride 0.9 % 100 mL IVPB  Status:  Discontinued        2 g 300 mL/hr over 20 Minutes Intravenous Every 4 hours 07/02/23 0044 07/02/23 1457        MEDICATIONS: Scheduled Meds:  amLODipine  5 mg Oral Daily   enoxaparin (LOVENOX) injection  40 mg Subcutaneous Q24H   insulin aspart  0-6 Units Subcutaneous Q4H   sodium chloride flush  3 mL Intravenous Q12H   Continuous Infusions:  sodium chloride 125 mL/hr at 07/04/23 0154   acyclovir     cefTRIAXone (ROCEPHIN)  IV 2 g (07/04/23 1016)   metronidazole 500 mg (07/04/23 0953)   potassium chloride 10 mEq (07/04/23 1010)   PRN Meds:.acetaminophen **OR** acetaminophen, HYDROmorphone (DILAUDID) injection, labetalol, oxyCODONE, polyethylene glycol, prochlorperazine   I have personally reviewed following labs and imaging studies  LABORATORY DATA: CBC: Recent Labs  Lab 07/01/23 2330 07/02/23 0045 07/02/23 0324 07/03/23 0425 07/04/23 0424  WBC 17.8* 17.1* 15.7* 11.4* 14.8*  NEUTROABS 14.8* 13.5*  --   --    --   HGB 15.6* 14.2 14.0 14.4 13.2  HCT 48.8* 43.7 43.1 43.2 39.6  MCV 91.4 90.3 90.4 88.0 88.4  PLT 211 218 191 182 185    Basic Metabolic Panel: Recent Labs  Lab 07/01/23 2330 07/02/23 0045 07/02/23 0046 07/02/23 0324 07/03/23 0422 07/03/23 0425 07/04/23 0424  NA 134*  --  134* 135  --  137 134*  K 3.2*  --  3.1* 2.6*  --  2.8* 2.7*  CL 100  --  100 101  --  101 101  CO2 21*  --  22 22  --  23 22  GLUCOSE 156*  --  131* 178*  --  97 132*  BUN 15  --  14 10  --  5* 16  CREATININE 0.87  --  0.75 0.78  --  0.79 1.80*  CALCIUM 9.6  --  9.6 8.8*  --  9.0 8.8*  MG  --  1.9  --   --  1.6*  --   --     GFR: Estimated Creatinine Clearance: 41.1 mL/min (A) (by C-G formula based on SCr of 1.8 mg/dL (H)).  Liver Function Tests: Recent Labs  Lab 07/01/23 2330 07/02/23 0046  AST 25 23  ALT 23 22  ALKPHOS 77 74  BILITOT 0.7 0.6  PROT 8.4* 7.4  ALBUMIN 4.1 3.8   No results for input(s): "LIPASE", "AMYLASE" in the last 168 hours. Recent Labs  Lab 07/02/23 0046  AMMONIA 23    Coagulation Profile: No results for input(s): "INR", "PROTIME" in the last 168 hours.  Cardiac Enzymes: Recent Labs  Lab 07/02/23 0045  CKTOTAL 309*    BNP (last 3 results) No results for input(s): "PROBNP" in the last 8760 hours.  Lipid Profile: No results for input(s): "CHOL", "HDL", "LDLCALC", "TRIG", "CHOLHDL", "LDLDIRECT" in the last 72 hours.  Thyroid Function Tests: Recent Labs    07/02/23 0046  TSH 1.232    Anemia Panel: Recent Labs    07/02/23 0055  VITAMINB12 835    Urine analysis:    Component Value Date/Time   COLORURINE YELLOW 07/02/2023 0034   APPEARANCEUR CLEAR 07/02/2023 0034   LABSPEC >1.046 (H) 07/02/2023 0034  PHURINE 6.0 07/02/2023 0034   GLUCOSEU NEGATIVE 07/02/2023 0034   GLUCOSEU NEGATIVE 12/11/2022 1103   HGBUR NEGATIVE 07/02/2023 0034   BILIRUBINUR NEGATIVE 07/02/2023 0034   KETONESUR 20 (A) 07/02/2023 0034   PROTEINUR 30 (A) 07/02/2023 0034    UROBILINOGEN 1.0 12/11/2022 1103   NITRITE NEGATIVE 07/02/2023 0034   LEUKOCYTESUR NEGATIVE 07/02/2023 0034    Sepsis Labs: Lactic Acid, Venous    Component Value Date/Time   LATICACIDVEN 1.9 07/02/2023 0342    MICROBIOLOGY: Recent Results (from the past 240 hour(s))  SARS Coronavirus 2 by RT PCR (hospital order, performed in [facility] hospital lab) *cepheid single result test* Anterior Nasal Swab     Status: None   Collection Time: 07/01/23 11:30 PM   Specimen: Anterior Nasal Swab  Result Value Ref Range Status   SARS Coronavirus 2 by RT PCR NEGATIVE NEGATIVE Final    Comment: Performed at [facility], [address] [place], [place] [address]  Culture, blood (routine x 2)     Status: None (Preliminary result)   Collection Time: 07/02/23 12:42 AM   Specimen: BLOOD RIGHT ARM  Result Value Ref Range Status   Specimen Description BLOOD RIGHT ARM  Final   Special Requests   Final    BOTTLES DRAWN AEROBIC AND ANAEROBIC Blood Culture adequate volume   Culture   Final    NO GROWTH 2 DAYS Performed at [facility], [address], [place], [place] [address]    Report Status PENDING  Incomplete  Culture, blood (routine x 2)     Status: None (Preliminary result)   Collection Time: 07/02/23 12:42 AM   Specimen: BLOOD LEFT ARM  Result Value Ref Range Status   Specimen Description BLOOD LEFT ARM  Final   Special Requests   Final    BOTTLES DRAWN AEROBIC AND ANAEROBIC Blood Culture adequate volume   Culture   Final    NO GROWTH 2 DAYS Performed at [facility], [address], [place], [place] [address]    Report Status PENDING  Incomplete  CSF culture w Gram Stain     Status: None (Preliminary result)   Collection Time: 07/02/23  1:33 AM   Specimen: CSF; Cerebrospinal Fluid  Result Value Ref Range Status   Specimen Description CSF  Final   Special Requests LP  Final   Gram Stain   Final    CYTOSPIN SMEAR WBC PRESENT, PREDOMINANTLY MONONUCLEAR NO  ORGANISMS SEEN    Culture   Final    NO GROWTH 2 DAYS Performed at [facility], [address] [place], [place] [address]    Report Status PENDING  Incomplete    RADIOLOGY STUDIES/RESULTS: US Abdomen Limited RUQ (LIVER/GB)  Result Date: 07/03/2023 CLINICAL DATA:  Right upper quadrant pain. EXAM: ULTRASOUND ABDOMEN LIMITED RIGHT UPPER QUADRANT COMPARISON:  PET-CT scan 05/24/2015. FINDINGS: Gallbladder: Dilated gallbladder. Borderline wall thickening of 3 mm. No adjacent fluid. Layering sludge and probable stones with some shadowing. No reported sonographic Murphy's sign Common bile duct: Diameter: 3 mm Liver: No focal lesion identified. Within normal limits in parenchymal echogenicity. Portal vein is patent on color Doppler imaging with normal direction of blood flow towards the liver. Other: None. IMPRESSION: Dilated gallbladder with layering sludge and probable stones. Borderline wall thickening but no adjacent fluid or reported sonographic Murphy's sign. No biliary ductal dilatation. If there is further concern of acute gallbladder pathology, HIDA scan could be of some benefit as clinically appropriate Electronically  Signed   By: Karen Kays M.D.   On: 07/03/2023 12:27     LOS: 2 days   Jeoffrey Massed, MD  [facility]    To contact the attending provider between 7A-7P or the covering provider during after hours 7P-7A, please log into the web site [email or web address] and access using universal [place] password for that web site. If you do not have the password, please call the hospital operator.  07/04/2023, 10:49 AM

## 2023-07-04 NOTE — Progress Notes (Signed)
[facility] night cross cover note:   I was notified by RN that this patient's sinus tachycardia is now slightly higher, with heart rates sustaining in the 120s.  This is a slightly from a heart rate in the 90s to low 100s during dayshift.  Her vital signs otherwise appears stable.  She remains afebrile, normotensive, with most recent blood pressure noted to be 127/78, oxygen saturations remain in the mid 90s on room air.  Brief chart review, she is here with HSV 2 meningoencephalitis as well as cholecysititis and remains on Rocephin, Flagyl, as well as acyclovir. Will continue to monitor.     Newton Pigg, DO Hospitalist

## 2023-07-04 NOTE — Progress Notes (Signed)
PT Cancellation Note  Patient Details Name: KEVON ARMAN MRN: [id number] DOB: Jan 13, 1961   Cancelled Treatment:    Reason Eval/Treat Not Completed: Medical issues which prohibited therapy (Per RN, Pt potassium is 2.7. Requests PT to hold at this time. Will follow up tomorrow.)   Gladys Damme 07/04/2023, 8:45 AM

## 2023-07-04 NOTE — Consult Note (Addendum)
[facility]    Date of Admission:  07/01/2023   Total days of inpatient antibiotics 3        Reason for Consult: meningitis    Principal Problem:   Acute encephalopathy Active Problems:   Hypertension, essential   Adjustment disorder with mixed anxiety and depressed mood   Non-insulin dependent type 2 diabetes mellitus (HCC)   SIRS (systemic inflammatory response syndrome) (HCC)   Hypokalemia   Assessment: 62 year old female with past medical history of diabetes, depression, breast cancer status post bilateral mastectomy presented with confusion to the ED, found to have:  #HSV meningoencephalitis #Acute calculus cholecystitis #AKI 2/2 #2 -Patient initially afebrile WBC 17K.  She underwent LP with neurology and CSF meningitis panel was positive for HSV-2.  Cell count was consistent with viral etiology as it showed 260 WBC, and 75 RBC, 2% neutrophils, 98% lymphs, 6% monocytes, 191 protein, 69 glucose.  Patient started on acyclovir. - She had some right upper quadrant pain with right upper quadrant ultrasound showing gallbladder thickening and gallstones.  General surgery engaged plan for OR Friday or Saturday. - Patient developed AKI.  Patient had serial creatinine 0.79 on 11/27 which trended up to 1.8 on 11/28. Recommendations:  -Continue acylovir(renally dosed) and can transition to PO valtrex 1gm tid when able to complete 10 days of antiviral therapy.  She was alert and able to answer my questions during the visit today. -Transition ctx and metro to [facility] [employer] [facility] planned for Friday or Saturday. Pending surgical intervention can stop abx once have source control   I suspect AKI is multifactorial in the setting of intra-abdominal infection and cholecystitis: would increase fluids, avoid nephrotoxic medications and renally dosed acyclovir. Agree with work-up per primary including imaging.  ID will sign off Microbiology:    Antibiotics: Acyclovir 11/25-present Ampicillin 06/30/2025 Ceftriaxone 11/25-26 Vancomycin 04/30/2025   Cultures: Blood 11/25 no growth Urine  Other No growth x 2 days  HPI: Margaret [name] is a 62 y.o. female with history of hypertension, diabetes, depression, BMI 36, breast cancer status post bilateral mastectomy presented with headache infusion.  She was afebrile on admission.  Noted to be confused around 6 PM without showing improvement as such brought to the ED.  WBC 17K.  She underwent LP with 260 WBC, and 75 RBC, 2% neutrophils, 98% lymphs, 6% monocytes, 191 protein, 69 glucose, meningitis panel positive for HSV-2.  She was initially started on broad-spectrum antibiotics and transition down to acyclovir.  She had mild right upper quadrant hide, right upper quadrant ultrasound which showed gallbladder her dilated with layering sludge and probable stone, wall thickening.  General surgery was consulted and plan on or possibly Friday/Saturday.  Patient started ceftriaxone metronidazole.  ID engaged for antibiotic recommendations.   Review of Systems: Review of Systems  All other systems reviewed and are negative.   Past Medical History:  Diagnosis Date   Allergy    Anxiety    Arthritis    Bilateral breast cancer (HCC)    Breast cancer (HCC)    Breast cancer of upper-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) 04/21/2015   Depression    Diabetes mellitus without complication (HCC)    GERD (gastroesophageal reflux disease)    Hypertension     Social History   Tobacco Use   Smoking status: Former    Current packs/day: 0.00    Average packs/day: 0.1 packs/day for 28.0 years (2.8 ttl pk-yrs)  Types: Cigarettes    Start date: 09/02/1990    Quit date: 09/02/2018    Years since quitting: 4.8   Smokeless tobacco: Never  Vaping Use   Vaping status: Never Used  Substance Use Topics   Alcohol use: Not Currently    Comment: Socially   Drug use: No    Family History   Problem Relation Age of Onset   Hypertension Mother    Diabetes Mother    Colon cancer Neg Hx    Esophageal cancer Neg Hx    Rectal cancer Neg Hx    Stomach cancer Neg Hx    Scheduled Meds:  amLODipine  5 mg Oral Daily   enoxaparin (LOVENOX) injection  40 mg Subcutaneous Q24H   insulin aspart  0-6 Units Subcutaneous Q4H   sodium chloride flush  3 mL Intravenous Q12H   Continuous Infusions:  sodium chloride 125 mL/hr at 07/04/23 1945   acyclovir 820 mg (07/04/23 1151)   cefTRIAXone (ROCEPHIN)  IV 2 g (07/04/23 1016)   metronidazole 500 mg (07/04/23 0953)   PRN Meds:.acetaminophen **OR** acetaminophen, HYDROmorphone (DILAUDID) injection, labetalol, oxyCODONE, polyethylene glycol, prochlorperazine Allergies  Allergen Reactions   Banana Hives    Tongue itching   Latex Itching and Other (See Comments)    burning   Peanuts [Peanut Oil] Hives    Patient is allergic to all tree nuts   Wheat Hives    OBJECTIVE: Blood pressure 127/78, pulse (!) 119, temperature 98 F (36.7 C), temperature source Oral, resp. rate (!) 21, height 5' 8.5" (1.74 m), weight 103.3 kg, SpO2 94%.  Physical Exam Constitutional:      Appearance: Normal appearance.  HENT:     Head: Normocephalic and atraumatic.     Right Ear: Tympanic membrane normal.     Left Ear: Tympanic membrane normal.     Nose: Nose normal.     Mouth/Throat:     Mouth: Mucous membranes are moist.  Eyes:     Extraocular Movements: Extraocular movements intact.     Conjunctiva/sclera: Conjunctivae normal.     Pupils: Pupils are equal, round, and reactive to light.  Cardiovascular:     Rate and Rhythm: Normal rate and regular rhythm.     Heart sounds: No murmur heard.    No friction rub. No gallop.  Pulmonary:     Effort: Pulmonary effort is normal.     Breath sounds: Normal breath sounds.  Abdominal:     General: Abdomen is flat.     Palpations: Abdomen is soft.  Musculoskeletal:        General: Normal range of motion.      Comments: Lesion on foot, no rashes  Skin:    General: Skin is warm and dry.  Neurological:     General: No focal deficit present.     Mental Status: She is alert and oriented to person, place, and time.  Psychiatric:        Mood and Affect: Mood normal.     Lab Results Lab Results  Component Value Date   WBC 14.8 (H) 07/04/2023   HGB 13.2 07/04/2023   HCT 39.6 07/04/2023   MCV 88.4 07/04/2023   PLT 185 07/04/2023    Lab Results  Component Value Date   CREATININE 2.00 (H) 07/04/2023   BUN 19 07/04/2023   NA 140 07/04/2023   K 4.3 07/04/2023   CL 110 07/04/2023   CO2 19 (L) 07/04/2023    Lab Results  Component Value Date  ALT 22 07/02/2023   AST 23 07/02/2023   ALKPHOS 74 07/02/2023   BILITOT 0.6 07/02/2023       Danelle Earthly, MD [facility] 07/04/2023, 9:07 PM I have personally spent 84 minutes involved in face-to-face and non-face-to-face activities for this patient on the day of the visit. Professional time spent includes the following activities: Preparing to see the patient (review of tests), Obtaining and/or reviewing separately obtained history (admission/discharge record), Performing a medically appropriate examination and/or evaluation , Ordering medications/tests/procedures, referring and communicating with other health care professionals, Documenting clinical information in the EMR, Independently interpreting results (not separately reported), Communicating results to the patient/family/caregiver, Counseling and educating the patient/family/caregiver and Care coordination (not separately reported).

## 2023-07-04 NOTE — Plan of Care (Signed)
  Problem: Education: Goal: Ability to describe self-care measures that may prevent or decrease complications (Diabetes Survival Skills Education) will improve Outcome: Progressing Goal: Individualized Educational Video(s) Outcome: Progressing   Problem: Coping: Goal: Ability to adjust to condition or change in health will improve Outcome: Progressing   Problem: Health Behavior/Discharge Planning: Goal: Ability to manage health-related needs will improve Outcome: Progressing   Problem: Skin Integrity: Goal: Risk for impaired skin integrity will decrease Outcome: Progressing   Problem: Clinical Measurements: Goal: Diagnostic test results will improve Outcome: Progressing Goal: Signs and symptoms of infection will decrease Outcome: Progressing   Problem: Respiratory: Goal: Ability to maintain adequate ventilation will improve Outcome: Progressing   Problem: Education: Goal: Knowledge of General Education information will improve Description: Including pain rating scale, medication(s)/side effects and non-pharmacologic comfort measures Outcome: Progressing   Problem: Clinical Measurements: Goal: Will remain free from infection Outcome: Progressing Goal: Diagnostic test results will improve Outcome: Progressing   Problem: Activity: Goal: Risk for activity intolerance will decrease Outcome: Progressing   Problem: Nutrition: Goal: Adequate nutrition will be maintained Outcome: Progressing

## 2023-07-05 ENCOUNTER — Encounter (HOSPITAL_COMMUNITY)
Admission: EM | Disposition: A | Discharge status: Home / Self Care | Payer: Self-pay | Source: Home / Self Care | Attending: Internal Medicine

## 2023-07-05 ENCOUNTER — Inpatient Hospital Stay (HOSPITAL_COMMUNITY): Payer: 59

## 2023-07-05 ENCOUNTER — Encounter (HOSPITAL_COMMUNITY): Payer: Self-pay | Admitting: Family Medicine

## 2023-07-05 ENCOUNTER — Other Ambulatory Visit: Payer: Self-pay

## 2023-07-05 DIAGNOSIS — K819 Cholecystitis, unspecified: Secondary | ICD-10-CM | POA: Diagnosis not present

## 2023-07-05 DIAGNOSIS — G9340 Encephalopathy, unspecified: Secondary | ICD-10-CM | POA: Diagnosis not present

## 2023-07-05 DIAGNOSIS — I10 Essential (primary) hypertension: Secondary | ICD-10-CM | POA: Diagnosis not present

## 2023-07-05 DIAGNOSIS — E119 Type 2 diabetes mellitus without complications: Secondary | ICD-10-CM | POA: Diagnosis not present

## 2023-07-05 DIAGNOSIS — R6510 Systemic inflammatory response syndrome (SIRS) of non-infectious origin without acute organ dysfunction: Secondary | ICD-10-CM | POA: Diagnosis not present

## 2023-07-05 HISTORY — PX: CHOLECYSTECTOMY: SHX55

## 2023-07-05 LAB — CBC
HCT: 42.2 % (ref 36.0–46.0)
Hemoglobin: 14.1 g/dL (ref 12.0–15.0)
MCH: 29.6 pg (ref 26.0–34.0)
MCHC: 33.4 g/dL (ref 30.0–36.0)
MCV: 88.5 fL (ref 80.0–100.0)
Platelets: 187 10*3/uL (ref 150–400)
RBC: 4.77 MIL/uL (ref 3.87–5.11)
RDW: 14 % (ref 11.5–15.5)
WBC: 15 10*3/uL — ABNORMAL HIGH (ref 4.0–10.5)
nRBC: 0 % (ref 0.0–0.2)

## 2023-07-05 LAB — CSF CULTURE W GRAM STAIN: Culture: NO GROWTH

## 2023-07-05 LAB — COMPREHENSIVE METABOLIC PANEL
ALT: 31 U/L (ref 0–44)
AST: 33 U/L (ref 15–41)
Albumin: 3.5 g/dL (ref 3.5–5.0)
Alkaline Phosphatase: 62 U/L (ref 38–126)
Anion gap: 10 (ref 5–15)
BUN: 14 mg/dL (ref 8–23)
CO2: 21 mmol/L — ABNORMAL LOW (ref 22–32)
Calcium: 9.2 mg/dL (ref 8.9–10.3)
Chloride: 109 mmol/L (ref 98–111)
Creatinine, Ser: 1.75 mg/dL — ABNORMAL HIGH (ref 0.44–1.00)
GFR, Estimated: 33 mL/min — ABNORMAL LOW (ref >60)
Glucose, Bld: 105 mg/dL — ABNORMAL HIGH (ref 70–99)
Potassium: 3.1 mmol/L — ABNORMAL LOW (ref 3.5–5.1)
Sodium: 140 mmol/L (ref 135–145)
Total Bilirubin: 0.6 mg/dL (ref <1.2)
Total Protein: 7.5 g/dL (ref 6.5–8.1)

## 2023-07-05 LAB — GLUCOSE, CAPILLARY
Glucose-Capillary: 101 mg/dL — ABNORMAL HIGH (ref 70–99)
Glucose-Capillary: 102 mg/dL — ABNORMAL HIGH (ref 70–99)
Glucose-Capillary: 124 mg/dL — ABNORMAL HIGH (ref 70–99)
Glucose-Capillary: 127 mg/dL — ABNORMAL HIGH (ref 70–99)
Glucose-Capillary: 128 mg/dL — ABNORMAL HIGH (ref 70–99)
Glucose-Capillary: 188 mg/dL — ABNORMAL HIGH (ref 70–99)
Glucose-Capillary: 217 mg/dL — ABNORMAL HIGH (ref 70–99)
Glucose-Capillary: 87 mg/dL (ref 70–99)
Glucose-Capillary: 89 mg/dL (ref 70–99)

## 2023-07-05 SURGERY — LAPAROSCOPIC CHOLECYSTECTOMY
Anesthesia: General | Site: Abdomen

## 2023-07-05 MED ORDER — CHLORHEXIDINE GLUCONATE CLOTH 2 % EX PADS
6.0000 | MEDICATED_PAD | Freq: Once | CUTANEOUS | Status: AC
  Administered 2023-07-05: 6 via TOPICAL

## 2023-07-05 MED ORDER — DEXAMETHASONE SODIUM PHOSPHATE 10 MG/ML IJ SOLN
INTRAMUSCULAR | Status: AC
  Filled 2023-07-05: qty 1

## 2023-07-05 MED ORDER — ONDANSETRON HCL 4 MG/2ML IJ SOLN
INTRAMUSCULAR | Status: AC
  Filled 2023-07-05: qty 2

## 2023-07-05 MED ORDER — POTASSIUM CHLORIDE 10 MEQ/100ML IV SOLN
10.0000 meq | INTRAVENOUS | Status: AC
  Administered 2023-07-05 (×4): 10 meq via INTRAVENOUS
  Filled 2023-07-05: qty 100

## 2023-07-05 MED ORDER — HYDROMORPHONE HCL 1 MG/ML IJ SOLN
INTRAMUSCULAR | Status: AC
  Administered 2023-07-05: 1 mg via INTRAVENOUS
  Filled 2023-07-05: qty 1

## 2023-07-05 MED ORDER — BUPIVACAINE-EPINEPHRINE 0.25% -1:200000 IJ SOLN
INTRAMUSCULAR | Status: DC | PRN
  Administered 2023-07-05: 30 mL

## 2023-07-05 MED ORDER — FENTANYL CITRATE (PF) 250 MCG/5ML IJ SOLN
INTRAMUSCULAR | Status: DC | PRN
  Administered 2023-07-05: 100 ug via INTRAVENOUS
  Administered 2023-07-05: 50 ug via INTRAVENOUS

## 2023-07-05 MED ORDER — KETOROLAC TROMETHAMINE 30 MG/ML IJ SOLN
INTRAMUSCULAR | Status: AC
  Filled 2023-07-05: qty 1

## 2023-07-05 MED ORDER — 0.9 % SODIUM CHLORIDE (POUR BTL) OPTIME
TOPICAL | Status: DC | PRN
  Administered 2023-07-05: 1000 mL

## 2023-07-05 MED ORDER — LIDOCAINE 2% (20 MG/ML) 5 ML SYRINGE
INTRAMUSCULAR | Status: DC | PRN
  Administered 2023-07-05: 100 mg via INTRAVENOUS

## 2023-07-05 MED ORDER — LIDOCAINE 2% (20 MG/ML) 5 ML SYRINGE
INTRAMUSCULAR | Status: AC
  Filled 2023-07-05: qty 5

## 2023-07-05 MED ORDER — OXYCODONE HCL 5 MG/5ML PO SOLN: 5.0000 mg | Freq: Once | ORAL | Status: DC | PRN

## 2023-07-05 MED ORDER — ROCURONIUM BROMIDE 10 MG/ML (PF) SYRINGE
PREFILLED_SYRINGE | INTRAVENOUS | Status: DC | PRN
  Administered 2023-07-05: 70 mg via INTRAVENOUS

## 2023-07-05 MED ORDER — CHLORHEXIDINE GLUCONATE 0.12 % MT SOLN
OROMUCOSAL | Status: AC
  Administered 2023-07-05: 15 mL via OROMUCOSAL
  Filled 2023-07-05: qty 15

## 2023-07-05 MED ORDER — ONDANSETRON HCL 4 MG/2ML IJ SOLN
INTRAMUSCULAR | Status: DC | PRN
  Administered 2023-07-05: 4 mg via INTRAVENOUS

## 2023-07-05 MED ORDER — ONDANSETRON HCL 4 MG/2ML IJ SOLN: 4.0000 mg | Freq: Once | INTRAMUSCULAR | Status: DC | PRN

## 2023-07-05 MED ORDER — DEXAMETHASONE SODIUM PHOSPHATE 10 MG/ML IJ SOLN
INTRAMUSCULAR | Status: DC | PRN
  Administered 2023-07-05: 10 mg via INTRAVENOUS

## 2023-07-05 MED ORDER — HYDROMORPHONE HCL 1 MG/ML IJ SOLN
0.2500 mg | INTRAMUSCULAR | Status: DC | PRN
Start: 2023-07-05 — End: 2023-07-05
  Administered 2023-07-05 (×3): 0.5 mg via INTRAVENOUS

## 2023-07-05 MED ORDER — ESMOLOL HCL 100 MG/10ML IV SOLN
INTRAVENOUS | Status: DC | PRN
Start: 2023-07-05 — End: 2023-07-05
  Administered 2023-07-05: 40 mg via INTRAVENOUS

## 2023-07-05 MED ORDER — SODIUM CHLORIDE 0.9 % IR SOLN
Status: DC | PRN
  Administered 2023-07-05: 1000 mL

## 2023-07-05 MED ORDER — KETOROLAC TROMETHAMINE 30 MG/ML IJ SOLN
30.0000 mg | Freq: Once | INTRAMUSCULAR | Status: AC | PRN
  Administered 2023-07-05: 30 mg via INTRAVENOUS

## 2023-07-05 MED ORDER — PHENYLEPHRINE HCL-NACL 20-0.9 MG/250ML-% IV SOLN
INTRAVENOUS | Status: DC | PRN
  Administered 2023-07-05 (×3): 80 ug via INTRAVENOUS

## 2023-07-05 MED ORDER — EPHEDRINE SULFATE-NACL 50-0.9 MG/10ML-% IV SOSY: PREFILLED_SYRINGE | INTRAVENOUS | Status: DC | PRN

## 2023-07-05 MED ORDER — HYDROMORPHONE HCL 1 MG/ML IJ SOLN
INTRAMUSCULAR | Status: AC
  Filled 2023-07-05: qty 1

## 2023-07-05 MED ORDER — PROPOFOL 10 MG/ML IV BOLUS
INTRAVENOUS | Status: AC
  Filled 2023-07-05: qty 20

## 2023-07-05 MED ORDER — LACTATED RINGERS IV SOLN
INTRAVENOUS | Status: DC
Start: 2023-07-05 — End: 2023-07-05

## 2023-07-05 MED ORDER — ROCURONIUM BROMIDE 10 MG/ML (PF) SYRINGE
PREFILLED_SYRINGE | INTRAVENOUS | Status: AC
  Filled 2023-07-05: qty 10

## 2023-07-05 MED ORDER — PHENYLEPHRINE 80 MCG/ML (10ML) SYRINGE FOR IV PUSH (FOR BLOOD PRESSURE SUPPORT)
PREFILLED_SYRINGE | INTRAVENOUS | Status: AC
  Filled 2023-07-05: qty 10

## 2023-07-05 MED ORDER — CHLORHEXIDINE GLUCONATE 0.12 % MT SOLN: 15.0000 mL | Freq: Once | OROMUCOSAL | Status: AC

## 2023-07-05 MED ORDER — BUPIVACAINE-EPINEPHRINE (PF) 0.25% -1:200000 IJ SOLN
INTRAMUSCULAR | Status: AC
  Filled 2023-07-05: qty 30

## 2023-07-05 MED ORDER — FENTANYL CITRATE (PF) 250 MCG/5ML IJ SOLN
INTRAMUSCULAR | Status: AC
  Filled 2023-07-05: qty 5

## 2023-07-05 MED ORDER — OXYCODONE HCL 5 MG PO TABS
ORAL_TABLET | ORAL | Status: AC
  Filled 2023-07-05: qty 1

## 2023-07-05 MED ORDER — OXYCODONE HCL 5 MG PO TABS: 5.0000 mg | ORAL_TABLET | Freq: Once | ORAL | Status: DC | PRN

## 2023-07-05 MED ORDER — PROPOFOL 10 MG/ML IV BOLUS
INTRAVENOUS | Status: DC | PRN
  Administered 2023-07-05: 120 mg via INTRAVENOUS

## 2023-07-05 MED ORDER — SUGAMMADEX SODIUM 200 MG/2ML IV SOLN
INTRAVENOUS | Status: DC | PRN
  Administered 2023-07-05: 350 mg via INTRAVENOUS

## 2023-07-05 MED ORDER — HYALURONIDASE HUMAN 150 UNIT/ML IJ SOLN
150.0000 [IU] | Freq: Once | INTRAMUSCULAR | Status: DC
  Filled 2023-07-05: qty 1

## 2023-07-05 MED ORDER — ORAL CARE MOUTH RINSE: 15.0000 mL | Freq: Once | OROMUCOSAL | Status: AC

## 2023-07-05 SURGICAL SUPPLY — 36 items
BAG COUNTER SPONGE SURGICOUNT (BAG) ×1 IMPLANT
BLADE CLIPPER SURG (BLADE) IMPLANT
CANISTER SUCT 3000ML PPV (MISCELLANEOUS) ×1 IMPLANT
CHLORAPREP W/TINT 26 (MISCELLANEOUS) ×1 IMPLANT
CLIP LIGATING HEMO LOK XL GOLD (MISCELLANEOUS) IMPLANT
CLIP LIGATING HEMO O LOK GREEN (MISCELLANEOUS) ×1 IMPLANT
COVER SURGICAL LIGHT HANDLE (MISCELLANEOUS) ×1 IMPLANT
DERMABOND ADVANCED .7 DNX12 (GAUZE/BANDAGES/DRESSINGS) ×1 IMPLANT
ELECT REM PT RETURN 9FT ADLT (ELECTROSURGICAL) ×1
ELECTRODE REM PT RTRN 9FT ADLT (ELECTROSURGICAL) ×1 IMPLANT
GLOVE BIOGEL PI IND STRL 7.0 (GLOVE) ×1 IMPLANT
GLOVE SURG SS PI 7.0 STRL IVOR (GLOVE) ×1 IMPLANT
GOWN STRL REUS W/ TWL LRG LVL3 (GOWN DISPOSABLE) ×3 IMPLANT
GRASPER SUT TROCAR 14GX15 (MISCELLANEOUS) ×1 IMPLANT
IRRIG SUCT STRYKERFLOW 2 WTIP (MISCELLANEOUS) ×1
IRRIGATION SUCT STRKRFLW 2 WTP (MISCELLANEOUS) ×1 IMPLANT
KIT BASIN OR (CUSTOM PROCEDURE TRAY) ×1 IMPLANT
KIT IMAGING PINPOINTPAQ (MISCELLANEOUS) IMPLANT
KIT TURNOVER KIT B (KITS) ×1 IMPLANT
NDL 22X1.5 STRL (OR ONLY) (MISCELLANEOUS) ×1 IMPLANT
NEEDLE 22X1.5 STRL (OR ONLY) (MISCELLANEOUS) ×1 IMPLANT
NS IRRIG 1000ML POUR BTL (IV SOLUTION) ×1 IMPLANT
PAD ARMBOARD 7.5X6 YLW CONV (MISCELLANEOUS) ×1 IMPLANT
POUCH RETRIEVAL ECOSAC 10 (ENDOMECHANICALS) ×1 IMPLANT
SCISSORS LAP 5X35 DISP (ENDOMECHANICALS) ×1 IMPLANT
SET TUBE SMOKE EVAC HIGH FLOW (TUBING) ×1 IMPLANT
SLEEVE Z-THREAD 5X100MM (TROCAR) ×2 IMPLANT
SPECIMEN JAR SMALL (MISCELLANEOUS) ×1 IMPLANT
SUT MNCRL AB 4-0 PS2 18 (SUTURE) ×1 IMPLANT
TOWEL GREEN STERILE (TOWEL DISPOSABLE) ×1 IMPLANT
TOWEL GREEN STERILE FF (TOWEL DISPOSABLE) ×1 IMPLANT
TRAY LAPAROSCOPIC MC (CUSTOM PROCEDURE TRAY) ×1 IMPLANT
TROCAR Z THREAD OPTICAL 12X100 (TROCAR) ×1 IMPLANT
TROCAR Z-THREAD OPTICAL 5X100M (TROCAR) ×1 IMPLANT
WARMER LAPAROSCOPE (MISCELLANEOUS) ×1 IMPLANT
WATER STERILE IRR 1000ML POUR (IV SOLUTION) ×1 IMPLANT

## 2023-07-05 NOTE — Progress Notes (Signed)
Report given to PACU. 1342  Patient to OR for lap cholecystectomy.

## 2023-07-05 NOTE — Evaluation (Signed)
Physical Therapy Brief Evaluation and Discharge Note Patient Details Name: Margaret [name] MRN: [id number] DOB: 02/10/1961 Today's Date: 07/05/2023   History of Present Illness  62 y.o. female who presents with headache and confusion. Pt scheduled for LAPAROSCOPIC CHOLECYSTECTOMY on 06/25/23. Past medical history significant for hypertension, diabetes mellitus, depression, BMI 36, and history of breast cancer status post bilateral mastectomy  Clinical Impression  Pt presents with admitting diagnosis above. Pt today was able to ambulate around unit independently with no AD. PTA pt reports she was fully independent still working and driving. Pt presents at or near baseline mobility. Pt has no further acute PT needs and will be signing off. Re consult PT if mobility status changes. Pt would benefit from continued mobility with mobility specialist during acute stay.       PT Assessment Patient does not need any further PT services  Assistance Needed at Discharge  PRN    Equipment Recommendations None recommended by PT  Recommendations for Other Services       Precautions/Restrictions Precautions Precautions: Fall Restrictions Weight Bearing Restrictions: No        Mobility  Bed Mobility   Supine/Sidelying to sit: Independent Sit to supine/sidelying: Independent    Transfers Overall transfer level: Independent Equipment used: None                    Ambulation/Gait Ambulation/Gait assistance: Independent Gait Distance (Feet): 250 Feet Assistive device: None Gait Pattern/deviations: WFL(Within Functional Limits) Gait Speed: Pace WFL General Gait Details: no LOB noted.  Home Activity Instructions    Stairs Stairs: Yes Stairs assistance: Modified independent (Device/Increase time)     General stair comments: Mimicked stairs using bedrail in room and marching in place.  Modified Rankin (Stroke Patients Only)        Balance Overall balance assessment:  No apparent balance deficits (not formally assessed)                        Pertinent Vitals/Pain PT - Brief Vital Signs All Vital Signs Stable: Yes Pain Assessment Pain Assessment: 0-10 Pain Score: 6  Pain Location: Arm and headache Pain Descriptors / Indicators: Discomfort, Headache Pain Intervention(s): Limited activity within patient's tolerance, Monitored during session     Home Living Family/patient expects to be discharged to:: Private residence Living Arrangements: Spouse/significant other Available Help at Discharge: Family;Available 24 hours/day Home Environment: Stairs to enter  [employer] of Steps: 14 Home Equipment: Grab bars - tub/shower;Hand held shower head        Prior Function Level of Independence: Independent      UE/LE Assessment   UE ROM/Strength/Tone/Coordination: WFL    LE ROM/Strength/Tone/Coordination: [facility]      Communication   Communication Communication: No apparent difficulties     Cognition Overall Cognitive Status: Appears within functional limits for tasks assessed/performed       General Comments General comments (skin integrity, edema, etc.): VSS on RA    Exercises     Assessment/Plan    PT Problem List         PT Visit Diagnosis Other abnormalities of gait and mobility (R26.89)    No Skilled PT Patient at baseline level of functioning;Patient is independent with all acitivity/mobility   Co-evaluation                AMPAC 6 Clicks Help needed turning from your back to your side while in a flat bed without using bedrails?: None Help needed moving from  lying on your back to sitting on the side of a flat bed without using bedrails?: None Help needed moving to and from a bed to a chair (including a wheelchair)?: None Help needed standing up from a chair using your arms (e.g., wheelchair or bedside chair)?: None Help needed to walk in hospital room?: None Help needed climbing 3-5 steps with a railing?  : None 6 Click Score: 24      End of Session Equipment Utilized During Treatment: Gait belt Activity Tolerance: Patient tolerated treatment well Patient left: Other (comment) (Handoff to OT) Nurse Communication: Mobility status PT Visit Diagnosis: Other abnormalities of gait and mobility (R26.89)     Time: 1610-9604 PT Time Calculation (min) (ACUTE ONLY): 17 min  Charges:   PT Evaluation $PT Eval Low Complexity: 1 Low      Shela Nevin, PT, DPT Acute Rehab Services [phone]   Gladys Damme  07/05/2023, 4:34 PM

## 2023-07-05 NOTE — Progress Notes (Signed)
Patient feeling better today.  Plan for laparoscopic cholecystectomy time permitting today by Dr. Sheliah Hatch.  The procedure has been discussed with the patient. Operative and non operative treatments have been discussed. Risks of surgery include bleeding, infection,  Common bile duct injury,  Injury to the stomach,liver, colon,small intestine, abdominal wall,  Diaphragm,  Major blood vessels,  And the need for an open procedure.  Other risks include worsening of medical problems, death,  DVT and pulmonary embolism, and cardiovascular events.   Medical options have also been discussed. The patient has been informed of long term expectations of surgery and non surgical options,  The patient agrees to proceed.

## 2023-07-05 NOTE — Progress Notes (Signed)
Pharmacy Antibiotic Note  Margaret [name] is a 62 y.o. female admitted on 07/01/2023 with AMS and concern for meningitis.  LP preformed in ED and acyclovir, ampicillin, ceftriaxone and vancomycin started as empiric regimen. Pharmacy has been consulted for acyclovir and vancomycin dosing.  Ampicillin and vancomycin now stopped but patient continues on acyclovir, ceftriaxone, and metronidazole.   Plan: Continue Acyclovir 820mg  q8h (dosed per 10mg /kg adj body weight), follow up transition to valtrex per ID Continue normal saline @125mL /hr to prevent nephrotoxicity with acyclovir   Continue ceftriaxone 2g twice daily and ampicillin 2g q4h per MD, will transition to Unasyn after surgery F/u renal function, micro work up and narrow as able  Follow up to stop antibiotics for cholecystitis once source control achieved, will pass off to evening pharmacy team to follow up after surgery.   Height: 5' 8.5" (174 cm) Weight: 103.3 kg (227 lb 11.8 oz) IBW/kg (Calculated) : 65.05  Temp (24hrs), Avg:98.2 F (36.8 C), Min:98 F (36.7 C), Max:98.4 F (36.9 C)  Recent Labs  Lab 07/02/23 0045 07/02/23 0046 07/02/23 0052 07/02/23 0324 07/02/23 0342 07/03/23 0425 07/04/23 0424 07/04/23 1503 07/05/23 0447  WBC 17.1*  --   --  15.7*  --  11.4* 14.8*  --  15.0*  CREATININE  --    < >  --  0.78  --  0.79 1.80* 2.00* 1.75*  LATICACIDVEN  --   --  3.0* 0.9 1.9  --   --   --   --    < > = values in this interval not displayed.    Estimated Creatinine Clearance: 42.3 mL/min (A) (by C-G formula based on SCr of 1.75 mg/dL (H)).    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  Blane Ohara, PharmD, BCPS PGY2 Pharmacy Resident

## 2023-07-05 NOTE — Anesthesia Preprocedure Evaluation (Signed)
Anesthesia Evaluation  Patient identified by MRN, date of birth, ID band Patient awake    Reviewed: Allergy & Precautions, H&P , NPO status , Patient's Chart, lab work & pertinent test results  Airway Mallampati: II  TM Distance: <3 FB Neck ROM: Full    Dental no notable dental hx.    Pulmonary neg pulmonary ROS, former smoker   Pulmonary exam normal breath sounds clear to auscultation       Cardiovascular hypertension, Normal cardiovascular exam Rhythm:Regular Rate:Normal     Neuro/Psych negative neurological ROS  negative psych ROS   GI/Hepatic negative GI ROS, Neg liver ROS,,,  Endo/Other  diabetes    Renal/GU Renal InsufficiencyRenal disease  negative genitourinary   Musculoskeletal negative musculoskeletal ROS (+)    Abdominal   Peds negative pediatric ROS (+)  Hematology negative hematology ROS (+)   Anesthesia Other Findings   Reproductive/Obstetrics negative OB ROS                             Anesthesia Physical Anesthesia Plan  ASA: 3  Anesthesia Plan: General   Post-op Pain Management: Tylenol PO (pre-op)*   Induction: Intravenous  PONV Risk Score and Plan: 3 and Ondansetron, Dexamethasone and Treatment may vary due to age or medical condition  Airway Management Planned: Oral ETT  Additional Equipment:   Intra-op Plan:   Post-operative Plan: Extubation in OR  Informed Consent: I have reviewed the patients History and Physical, chart, labs and discussed the procedure including the risks, benefits and alternatives for the proposed anesthesia with the patient or authorized representative who has indicated his/her understanding and acceptance.     Dental advisory given  Plan Discussed with: CRNA and Surgeon  Anesthesia Plan Comments:        Anesthesia Quick Evaluation

## 2023-07-05 NOTE — Evaluation (Signed)
Occupational Therapy Evaluation Patient Details Name: Margaret [name] MRN: [id number] DOB: 08/08/1960 Today's Date: 07/05/2023   History of Present Illness 62 y.o. female who presents with headache and confusion. Pt scheduled for LAPAROSCOPIC CHOLECYSTECTOMY on 06/25/23. Past medical history significant for hypertension, diabetes mellitus, depression, BMI 36, and history of breast cancer status post bilateral mastectomy   Clinical Impression   Pt admitted for above, presents close to baseline and completes ADLs/mobility independently. Pt scheduled for laparoscopic cholecystectomy later in the PM, OT will follow-up with pt after sx as able to ensure pt Is still close to baseline function and has no additional needs. Anticipate no post acute OT needed.        If plan is discharge home, recommend the following: Other (comment) (prn)    Functional Status Assessment  Patient has not had a recent decline in their functional status  Equipment Recommendations  None recommended by OT    Recommendations for Other Services       Precautions / Restrictions Precautions Precautions: Fall Restrictions Weight Bearing Restrictions: No      Mobility Bed Mobility Overal bed mobility: Modified Independent             General bed mobility comments: Educated pt on log roll if needed s/p her cholecystectomy today. She demonstrated good understanding of technique    Transfers Overall transfer level: Independent                        Balance Overall balance assessment: Mild deficits observed, not formally tested                                         ADL either performed or assessed with clinical judgement   ADL Overall ADL's : Independent                                       General ADL Comments: Pt independently ambulating around room, completes LBD without assist using figure four technique. Educated pt on limiting bending/twisitng at  the waist with incurring pain following her upcoming sx     Vision         Perception         Praxis         Pertinent Vitals/Pain Pain Assessment Pain Assessment: 0-10 Pain Score: 6  Pain Location: Arm and headache Pain Descriptors / Indicators: Discomfort, Headache Pain Intervention(s): Limited activity within patient's tolerance, Monitored during session     Extremity/Trunk Assessment Upper Extremity Assessment Upper Extremity Assessment: Overall WFL for tasks assessed   Lower Extremity Assessment Lower Extremity Assessment: Defer to PT evaluation   Cervical / Trunk Assessment Cervical / Trunk Assessment: Normal   Communication Communication Communication: No apparent difficulties   Cognition Arousal: Alert Behavior During Therapy: WFL for tasks assessed/performed Overall Cognitive Status: Within Functional Limits for tasks assessed                                       General Comments  VSS on RA    Exercises     Shoulder Instructions      Home Living Family/patient expects to be discharged to:: Private residence Living Arrangements: Spouse/significant other Available  Help at Discharge: Family;Available 24 hours/day Type of Home: House (townhouse) Home Access: Stairs to enter [employer] of Steps: 14 Entrance Stairs-Rails: Left Home Layout: One level     Bathroom Shower/Tub: Producer, television/film/video: Standard Bathroom Accessibility: Yes   Home Equipment: Grab bars - tub/shower;Hand held shower head          Prior Functioning/Environment Prior Level of Function : Independent/Modified Independent;Working/employed;Driving             Mobility Comments: Ind no AD ADLs Comments: Ind        OT Problem List: Pain      OT Treatment/Interventions: Self-care/ADL training;Balance training;Therapeutic exercise;Therapeutic activities;Patient/family education    OT Goals(Current goals can be found in the  care plan section) Acute Rehab OT Goals Patient Stated Goal: to go home OT Goal Formulation: With patient Time For Goal Achievement: 07/19/23 Potential to Achieve Goals: Good  OT Frequency: Min 1X/week    Co-evaluation              AM-PAC OT "6 Clicks" Daily Activity     Outcome Measure Help from another person eating meals?: None Help from another person taking care of personal grooming?: None Help from another person toileting, which includes using toliet, bedpan, or urinal?: None Help from another person bathing (including washing, rinsing, drying)?: None Help from another person to put on and taking off regular upper body clothing?: None Help from another person to put on and taking off regular lower body clothing?: None 6 Click Score: 24   End of Session Nurse Communication: Mobility status  Activity Tolerance: Patient tolerated treatment well Patient left: in bed;with call bell/phone within reach  OT Visit Diagnosis: Other symptoms and signs involving cognitive function                Time: 9563-8756 OT Time Calculation (min): 8 min Charges:  OT General Charges $OT Visit: 1 Visit OT Evaluation $OT Eval Low Complexity: 1 Low  07/05/2023  AB, OTR/L  Acute Rehabilitation Services  Office: [phone]   Tristan Schroeder 07/05/2023, 12:30 PM

## 2023-07-05 NOTE — Progress Notes (Signed)
Pre Procedure note for inpatients:   Margaret [name] has been scheduled for Procedure(s): LAPAROSCOPIC CHOLECYSTECTOMY (N/A) today. The various methods of treatment have been discussed with the patient. After consideration of the risks, benefits and treatment options the patient has consented to the planned procedure.   The patient has been seen and labs reviewed. There are no changes in the patient's condition to prevent proceeding with the planned procedure today.  Recent labs:  Lab Results  Component Value Date   WBC 15.0 (H) 07/05/2023   HGB 14.1 07/05/2023   HCT 42.2 07/05/2023   PLT 187 07/05/2023   GLUCOSE 105 (H) 07/05/2023   CHOL 99 12/11/2022   TRIG 107.0 12/11/2022   HDL 34.00 (L) 12/11/2022   LDLCALC 44 12/11/2022   ALT 31 07/05/2023   AST 33 07/05/2023   NA 140 07/05/2023   K 3.1 (L) 07/05/2023   CL 109 07/05/2023   CREATININE 1.75 (H) 07/05/2023   BUN 14 07/05/2023   CO2 21 (L) 07/05/2023   TSH 1.232 07/02/2023   HGBA1C 7.0 03/26/2023   MICROALBUR 0.8 12/11/2022    Rodman Pickle, MD 07/05/2023 2:00 PM

## 2023-07-05 NOTE — Progress Notes (Signed)
Patient returns from OR to 5W09 at  this time

## 2023-07-05 NOTE — Progress Notes (Addendum)
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

## 2023-07-05 NOTE — Progress Notes (Signed)
Patient remains off the floor in or.

## 2023-07-05 NOTE — Transfer of Care (Signed)
Immediate Anesthesia Transfer of Care Note  Patient: Margaret [name]  Procedure(s) Performed: LAPAROSCOPIC CHOLECYSTECTOMY (Abdomen)  Patient Location: PACU  Anesthesia Type:General  Level of Consciousness: awake, alert , pateint uncooperative, and confused  Airway & Oxygen Therapy: Patient Spontanous Breathing and Patient connected to face mask oxygen  Post-op Assessment: Report given to RN and Post -op Vital signs reviewed and stable  Post vital signs: Reviewed and stable  Last Vitals:  Vitals Value Taken Time  BP 140/96 07/05/23 1535  Temp 97.5   Pulse 101 07/05/23 1543  Resp 20 07/05/23 1543  SpO2 93 % 07/05/23 1543  Vitals shown include unfiled device data.  Last Pain:  Vitals:   07/05/23 1419  TempSrc: Oral  PainSc:       Patients Stated Pain Goal: 1 (07/05/23 0438)  Complications: No notable events documented.

## 2023-07-05 NOTE — Op Note (Signed)
PATIENT:  Margaret [name]  62 y.o. female  PRE-OPERATIVE DIAGNOSIS:  cholecystitis  POST-OPERATIVE DIAGNOSIS:  cholecystitis  PROCEDURE:  Procedure(s): LAPAROSCOPIC CHOLECYSTECTOMY   SURGEON:  Mariavictoria Nottingham, De Blanch, MD   ASSISTANT: none  ANESTHESIA:   local and general  Indications for procedure: [name] is a 62 y.o. female with symptoms of Abdominal pain consistent with gallbladder disease, Confirmed by ultrasound.  Description of procedure: The patient was brought into the operative suite, placed supine. Anesthesia was administered with endotracheal tube. Patient was strapped in place and foot board was secured. All pressure points were offloaded by foam padding. The patient was prepped and draped in the usual sterile fashion.  A periumbilical incision was made and optical entry was used to enter the abdomen. 2 5 mm trocars were placed on in the right lateral space on in the right subcostal space. A 12mm trocar was placed in the subxiphoid space. Marcaine was infused to the subxiphoid space and lateral upper right abdomen in the transversus abdominis plane. Next the patient was placed in reverse trendelenberg. The gallbladder appearedfull of stones and chronically inflamed.   The gallbladder was retracted cephalad and lateral. The peritoneum was reflected off the infundibulum working lateral to medial. The cystic duct and cystic artery were identified and further dissection revealed a critical view. The cystic duct and cystic artery were doubly clipped and ligated.   The gallbladder was removed off the liver bed with cautery. The Gallbladder was placed in a specimen bag. The gallbladder fossa was irrigated and hemostasis was applied with cautery. The gallbladder was removed via the 12mm trocar. The fascial defect was closed with interrupted 0 vicryl suture via laparoscopic trans-fascial suture passer and loose stones were removed. Pneumoperitoneum was removed, all trocar were  removed. All incisions were closed with 4-0 monocryl subcuticular stitch. The patient woke from anesthesia and was brought to PACU in stable condition. All counts were correct  Findings: chronic cholecystitis with several small stones  Specimen: gallbladder  Blood loss: 30 ml  Local anesthesia:  30 ml Marcaine  Complications: none  PLAN OF CARE: Admit to inpatient   PATIENT DISPOSITION:  PACU - hemodynamically stable.  De Blanch [facility], [place]

## 2023-07-05 NOTE — Progress Notes (Signed)
PROGRESS NOTE        PATIENT DETAILS Name: Margaret [name] Age: 62 y.o. Sex: female Date of Birth: [birth year] Admit Date: 07/01/2023 Admitting Physician Briscoe Deutscher, MD [id number], Austin Miles, MD  Brief Summary: Patient is a 62 y.o.  female with history of HTN, DM-2, breast cancer-s/p bilateral mastectomy 2016-presented with headache/confusion-underwent LP-CSF positive for HSV-2.  Admitted and started on IV acyclovir with significant improvement in mentation-however further hospital course complicated by RUQ pain-secondary to acute calculus cholecystitis.  See below for further details.  Significant events: 11/25>> admit to [facility]  Significant studies: 11/25>> CXR: No PNA 11/26>> MRI brain: No evidence of infarct 11/26>> CTA head/neck: No LVO/hemodynamically significant stenosis 11/26>> Spot EEG: Negative for seizures 11/26>> CSF-tube 4-RBC 175-WBC 260-98% lymphocytes, protein 191-HSV II positive on biofire panel 11/27>> RUQ ultrasound: Dilated gallbladder/stones 11/28>> renal ultrasound: No hydronephrosis  Significant microbiology data: 11/26>> COVID PCR: Negative 11/26>> blood culture: No growth 11/26>> CSF culture: Pending  Procedures: 11/26>> lumbar puncture  Consults: Neurology [facility] ID  Subjective: Feels much better today-continues to have some RUQ pain.  Completely awake and alert.  No headache.  Objective: Vitals: Blood pressure (!) 145/99, pulse 91, temperature 98.3 F (36.8 C), temperature source Oral, resp. rate (!) 24, height 5' 8.5" (1.74 m), weight 103.3 kg, SpO2 98%.   Exam: Awake/alert Neck supple Abdomen: RUQ tender but rest of the belly soft Nonfocal exam.  Pertinent Labs/Radiology:    Latest Ref Rng & Units 07/05/2023    4:47 AM 07/04/2023    4:24 AM 07/03/2023    4:25 AM  CBC  WBC 4.0 - 10.5 K/uL 15.0  14.8  11.4   Hemoglobin 12.0 - 15.0 g/dL 62.9  52.8  41.3   Hematocrit 36.0 - 46.0 % 42.2  39.6  43.2    Platelets 150 - 400 K/uL 187  185  182     Lab Results  Component Value Date   NA 140 07/05/2023   K 3.1 (L) 07/05/2023   CL 109 07/05/2023   CO2 21 (L) 07/05/2023      Assessment/Plan: HSV-2 meningoencephalitis Much improved-completely awake/alert this morning IV acyclovir for now-once patient is s/p cholecystectomy-and can tolerate oral intake-can transition to Valtrex to complete a 10-day course.  Appreciate ID/neuro input.  Acute calculus cholecystitis RUQ pain slightly better Continue Rocephin/Flagyl General Surgery planning laparoscopic cholecystectomy later today.  AKI Combination of hemodynamically mediated kidney injury due to worsening cholecystitis and possibly from acyclovir AKI slowly improving Continue to treat underlying cholecystitis-with IV antibiotics-scheduled for cholecystectomy later today-remains on IVF Repeat electrolytes tomorrow Avoid nephrotoxic agents  Hypokalemia Likely from acyclovir Replete/recheck.  DM-2 (A1c 7.0 on 8/20) SSI while inpatient Resume oral hypoglycemic agents on discharge  Recent Labs    07/05/23 0015 07/05/23 0433 07/05/23 0907  GLUCAP 127* 102* 101*     HTN BP stable  Continue amlodipine Hold olmesartan due to AKI.  History of breast cancer-s/p mastectomy Currently on observation-resume outpatient follow-up with oncology as previously scheduled.  8 mm meningioma left frontal convexity Incidental finding Stable for outpatient follow-up  Obesity: Estimated body mass index is 34.12 kg/m as calculated from the following:   Height as of this encounter: 5' 8.5" (1.74 m).   Weight as of this encounter: 103.3 kg.   Code status:   Code Status: Full Code   DVT Prophylaxis: SCD's  Start: 07/05/23 0931 enoxaparin (LOVENOX) injection 40 mg Start: 07/02/23 1500   Family Communication: Daughter-[phone]-updated 11/28   Disposition Plan: Status is: Inpatient Remains inpatient appropriate because:  Severity of illness   Planned Discharge Destination:Home health   Diet: Diet Order             Diet NPO time specified  Diet effective now                     Antimicrobial agents: Anti-infectives (From admission, onward)    Start     Dose/Rate Route Frequency Ordered Stop   07/04/23 1200  acyclovir (ZOVIRAX) 820 mg in dextrose 5 % 250 mL IVPB        10 mg/kg  82.1 kg (Adjusted) 266.4 mL/hr over 60 Minutes Intravenous Every 12 hours 07/04/23 0758     07/04/23 0945  cefTRIAXone (ROCEPHIN) 2 g in sodium chloride 0.9 % 100 mL IVPB        2 g 200 mL/hr over 30 Minutes Intravenous Every 24 hours 07/04/23 0857     07/04/23 0945  metroNIDAZOLE (FLAGYL) IVPB 500 mg        500 mg 100 mL/hr over 60 Minutes Intravenous Every 12 hours 07/04/23 0858     07/02/23 1500  vancomycin (VANCOREADY) IVPB 500 mg/100 mL  Status:  Discontinued       Placed in "Followed by" Linked Group   500 mg 100 mL/hr over 60 Minutes Intravenous  Once 07/02/23 0531 07/02/23 1457   07/02/23 1400  vancomycin (VANCOREADY) IVPB 1500 mg/300 mL  Status:  Discontinued        1,500 mg 150 mL/hr over 120 Minutes Intravenous Every 12 hours 07/02/23 0526 07/02/23 0531   07/02/23 1400  vancomycin (VANCOCIN) IVPB 1000 mg/200 mL premix  Status:  Discontinued       Placed in "Followed by" Linked Group   1,000 mg 200 mL/hr over 60 Minutes Intravenous  Once 07/02/23 0531 07/02/23 1520   07/02/23 1000  acyclovir (ZOVIRAX) 820 mg in dextrose 5 % 250 mL IVPB  Status:  Discontinued        10 mg/kg  82.1 kg (Adjusted) 266.4 mL/hr over 60 Minutes Intravenous Every 8 hours 07/02/23 0259 07/04/23 0758   07/02/23 0130  acyclovir (ZOVIRAX) 1,075 mg in dextrose 5 % 250 mL IVPB  Status:  Discontinued        10 mg/kg  107.5 kg 271.5 mL/hr over 60 Minutes Intravenous Every 8 hours 07/02/23 0123 07/02/23 0259   07/02/23 0115  vancomycin (VANCOCIN) IVPB 1000 mg/200 mL premix       Placed in "Followed by" Linked Group   1,000 mg 200  mL/hr over 60 Minutes Intravenous  Once 07/02/23 0102 07/02/23 0433   07/02/23 0115  vancomycin (VANCOCIN) IVPB 1000 mg/200 mL premix       Placed in "Followed by" Linked Group   1,000 mg 200 mL/hr over 60 Minutes Intravenous  Once 07/02/23 0102 07/02/23 0354   07/02/23 0045  vancomycin (VANCOCIN) IVPB 1000 mg/200 mL premix  Status:  Discontinued        1,000 mg 200 mL/hr over 60 Minutes Intravenous  Once 07/02/23 0044 07/02/23 0102   07/02/23 0045  cefTRIAXone (ROCEPHIN) 2 g in sodium chloride 0.9 % 100 mL IVPB  Status:  Discontinued        2 g 200 mL/hr over 30 Minutes Intravenous Every 12 hours 07/02/23 0044 07/02/23 1457   07/02/23 0045  ampicillin (OMNIPEN) 2 g  in sodium chloride 0.9 % 100 mL IVPB  Status:  Discontinued        2 g 300 mL/hr over 20 Minutes Intravenous Every 4 hours 07/02/23 0044 07/02/23 1457        MEDICATIONS: Scheduled Meds:  amLODipine  5 mg Oral Daily   Chlorhexidine Gluconate Cloth  6 each Topical Once   And   Chlorhexidine Gluconate Cloth  6 each Topical Once   enoxaparin (LOVENOX) injection  40 mg Subcutaneous Q24H   insulin aspart  0-6 Units Subcutaneous Q4H   sodium chloride flush  3 mL Intravenous Q12H   Continuous Infusions:  sodium chloride 125 mL/hr at 07/04/23 1945   acyclovir 820 mg (07/04/23 2359)   cefTRIAXone (ROCEPHIN)  IV 2 g (07/05/23 1007)   metronidazole 500 mg (07/05/23 1020)   PRN Meds:.acetaminophen **OR** acetaminophen, HYDROmorphone (DILAUDID) injection, labetalol, oxyCODONE, polyethylene glycol, prochlorperazine   I have personally reviewed following labs and imaging studies  LABORATORY DATA: CBC: Recent Labs  Lab 07/01/23 2330 07/02/23 0045 07/02/23 0324 07/03/23 0425 07/04/23 0424 07/05/23 0447  WBC 17.8* 17.1* 15.7* 11.4* 14.8* 15.0*  NEUTROABS 14.8* 13.5*  --   --   --   --   HGB 15.6* 14.2 14.0 14.4 13.2 14.1  HCT 48.8* 43.7 43.1 43.2 39.6 42.2  MCV 91.4 90.3 90.4 88.0 88.4 88.5  PLT 211 218 191 182 185  187    Basic Metabolic Panel: Recent Labs  Lab 07/02/23 0045 07/02/23 0046 07/02/23 0324 07/03/23 0422 07/03/23 0425 07/04/23 0424 07/04/23 1503 07/05/23 0447  NA  --    < > 135  --  137 134* 140 140  K  --    < > 2.6*  --  2.8* 2.7* 4.3 3.1*  CL  --    < > 101  --  101 101 110 109  CO2  --    < > 22  --  23 22 19* 21*  GLUCOSE  --    < > 178*  --  97 132* 92 105*  BUN  --    < > 10  --  5* 16 19 14   CREATININE  --    < > 0.78  --  0.79 1.80* 2.00* 1.75*  CALCIUM  --    < > 8.8*  --  9.0 8.8* 9.1 9.2  MG 1.9  --   --  1.6*  --   --  1.7  --    < > = values in this interval not displayed.    GFR: Estimated Creatinine Clearance: 42.3 mL/min (A) (by C-G formula based on SCr of 1.75 mg/dL (H)).  Liver Function Tests: Recent Labs  Lab 07/01/23 2330 07/02/23 0046 07/05/23 0447  AST 25 23 33  ALT 23 22 31   ALKPHOS 77 74 62  BILITOT 0.7 0.6 0.6  PROT 8.4* 7.4 7.5  ALBUMIN 4.1 3.8 3.5   No results for input(s): "LIPASE", "AMYLASE" in the last 168 hours. Recent Labs  Lab 07/02/23 0046  AMMONIA 23    Coagulation Profile: No results for input(s): "INR", "PROTIME" in the last 168 hours.  Cardiac Enzymes: Recent Labs  Lab 07/02/23 0045  CKTOTAL 309*    BNP (last 3 results) No results for input(s): "PROBNP" in the last 8760 hours.  Lipid Profile: No results for input(s): "CHOL", "HDL", "LDLCALC", "TRIG", "CHOLHDL", "LDLDIRECT" in the last 72 hours.  Thyroid Function Tests: No results for input(s): "TSH", "T4TOTAL", "FREET4", "T3FREE", "THYROIDAB" in the last 72  hours.   Anemia Panel: No results for input(s): "VITAMINB12", "FOLATE", "FERRITIN", "TIBC", "IRON", "RETICCTPCT" in the last 72 hours.   Urine analysis:    Component Value Date/Time   COLORURINE YELLOW 07/02/2023 0034   APPEARANCEUR CLEAR 07/02/2023 0034   LABSPEC >1.046 (H) 07/02/2023 0034   PHURINE 6.0 07/02/2023 0034   GLUCOSEU NEGATIVE 07/02/2023 0034   GLUCOSEU NEGATIVE 12/11/2022 1103    HGBUR NEGATIVE 07/02/2023 0034   BILIRUBINUR NEGATIVE 07/02/2023 0034   KETONESUR 20 (A) 07/02/2023 0034   PROTEINUR 30 (A) 07/02/2023 0034   UROBILINOGEN 1.0 12/11/2022 1103   NITRITE NEGATIVE 07/02/2023 0034   LEUKOCYTESUR NEGATIVE 07/02/2023 0034    Sepsis Labs: Lactic Acid, Venous    Component Value Date/Time   LATICACIDVEN 1.9 07/02/2023 0342    MICROBIOLOGY: Recent Results (from the past 240 hour(s))  SARS Coronavirus 2 by RT PCR (hospital order, performed in [facility] hospital lab) *cepheid single result test* Anterior Nasal Swab     Status: None   Collection Time: 07/01/23 11:30 PM   Specimen: Anterior Nasal Swab  Result Value Ref Range Status   SARS Coronavirus 2 by RT PCR NEGATIVE NEGATIVE Final    Comment: Performed at [facility], [address], [place], [place] [address]  Culture, blood (routine x 2)     Status: None (Preliminary result)   Collection Time: 07/02/23 12:42 AM   Specimen: BLOOD RIGHT ARM  Result Value Ref Range Status   Specimen Description BLOOD RIGHT ARM  Final   Special Requests   Final    BOTTLES DRAWN AEROBIC AND ANAEROBIC Blood Culture adequate volume   Culture   Final    NO GROWTH 3 DAYS Performed at [facility], [address], [place], [place] [address]    Report Status PENDING  Incomplete  Culture, blood (routine x 2)     Status: None (Preliminary result)   Collection Time: 07/02/23 12:42 AM   Specimen: BLOOD LEFT ARM  Result Value Ref Range Status   Specimen Description BLOOD LEFT ARM  Final   Special Requests   Final    BOTTLES DRAWN AEROBIC AND ANAEROBIC Blood Culture adequate volume   Culture   Final    NO GROWTH 3 DAYS Performed at [facility], [address] [place], [place] [address]    Report Status PENDING  Incomplete  CSF culture w Gram Stain     Status: None   Collection Time: 07/02/23  1:33 AM   Specimen: CSF; Cerebrospinal Fluid  Result Value Ref Range Status   Specimen Description CSF   Final   Special Requests LP  Final   Gram Stain   Final    CYTOSPIN SMEAR WBC PRESENT, PREDOMINANTLY MONONUCLEAR NO ORGANISMS SEEN    Culture   Final    NO GROWTH 3 DAYS Performed at [facility], [address] [place], [place] [address]    Report Status 07/05/2023 FINAL  Final    RADIOLOGY STUDIES/RESULTS: US RENAL  Result Date: 07/04/2023 CLINICAL DATA:  Acute kidney injury EXAM: RENAL / URINARY TRACT ULTRASOUND COMPLETE COMPARISON:  None Available. FINDINGS: Right Kidney: Renal measurements: 13.3 x 6.4 x 5.8 cm = volume: 259 mL. Echogenicity within normal limits. No mass or hydronephrosis visualized. Left Kidney: Renal measurements: 13.0 x 6.4 x 5.7 cm = volume: 244 mL. Echogenicity within normal limits. No mass or hydronephrosis visualized. Simple appearing cysts identified with smooth margins, through transmission and is anechoic measuring 4.1 x  4.0 x 4.3 cm. Bladder: Bladder is contracted. Other: None. IMPRESSION: No collecting system dilatation.  Simple left-sided renal cysts. Electronically Signed   By: Karen Kays M.D.   On: 07/04/2023 11:50   US Abdomen Limited RUQ (LIVER/GB)  Result Date: 07/03/2023 CLINICAL DATA:  Right upper quadrant pain. EXAM: ULTRASOUND ABDOMEN LIMITED RIGHT UPPER QUADRANT COMPARISON:  PET-CT scan 05/24/2015. FINDINGS: Gallbladder: Dilated gallbladder. Borderline wall thickening of 3 mm. No adjacent fluid. Layering sludge and probable stones with some shadowing. No reported sonographic Murphy's sign Common bile duct: Diameter: 3 mm Liver: No focal lesion identified. Within normal limits in parenchymal echogenicity. Portal vein is patent on color Doppler imaging with normal direction of blood flow towards the liver. Other: None. IMPRESSION: Dilated gallbladder with layering sludge and probable stones. Borderline wall thickening but no adjacent fluid or reported sonographic Murphy's sign. No biliary ductal dilatation. If there is further concern of acute  gallbladder pathology, HIDA scan could be of some benefit as clinically appropriate Electronically Signed   By: Karen Kays M.D.   On: 07/03/2023 12:27     LOS: 3 days   Jeoffrey Massed, MD  [facility]    To contact the attending provider between 7A-7P or the covering provider during after hours 7P-7A, please log into the web site [email or web address] and access using universal [place] password for that web site. If you do not have the password, please call the hospital operator.  07/05/2023, 11:14 AM

## 2023-07-05 NOTE — Anesthesia Procedure Notes (Signed)
Procedure Name: Intubation Date/Time: 07/05/2023 2:38 PM  Performed by: Pincus Large, CRNAPre-anesthesia Checklist: Patient identified, Emergency Drugs available, Suction available and Patient being monitored Patient Re-evaluated:Patient Re-evaluated prior to induction Oxygen Delivery Method: Circle System Utilized Preoxygenation: Pre-oxygenation with 100% oxygen Induction Type: IV induction Ventilation: Mask ventilation without difficulty Laryngoscope Size: Miller and 2 Grade View: Grade II Tube type: Oral Tube size: 7.0 mm Number of attempts: 1 Airway Equipment and Method: Stylet and Oral airway Placement Confirmation: ETT inserted through vocal cords under direct vision, positive ETCO2 and breath sounds checked- equal and bilateral Secured at: 22 cm Tube secured with: Tape Dental Injury: Teeth and Oropharynx as per pre-operative assessment

## 2023-07-06 DIAGNOSIS — G039 Meningitis, unspecified: Secondary | ICD-10-CM

## 2023-07-06 DIAGNOSIS — G9340 Encephalopathy, unspecified: Secondary | ICD-10-CM | POA: Diagnosis not present

## 2023-07-06 LAB — MAGNESIUM: Magnesium: 1.7 mg/dL (ref 1.7–2.4)

## 2023-07-06 LAB — COMPREHENSIVE METABOLIC PANEL
ALT: 64 U/L — ABNORMAL HIGH (ref 0–44)
AST: 65 U/L — ABNORMAL HIGH (ref 15–41)
Albumin: 3.1 g/dL — ABNORMAL LOW (ref 3.5–5.0)
Alkaline Phosphatase: 60 U/L (ref 38–126)
Anion gap: 7 (ref 5–15)
BUN: 15 mg/dL (ref 8–23)
CO2: 23 mmol/L (ref 22–32)
Calcium: 8.9 mg/dL (ref 8.9–10.3)
Chloride: 110 mmol/L (ref 98–111)
Creatinine, Ser: 1.42 mg/dL — ABNORMAL HIGH (ref 0.44–1.00)
GFR, Estimated: 42 mL/min — ABNORMAL LOW (ref >60)
Glucose, Bld: 103 mg/dL — ABNORMAL HIGH (ref 70–99)
Potassium: 3.1 mmol/L — ABNORMAL LOW (ref 3.5–5.1)
Sodium: 140 mmol/L (ref 135–145)
Total Bilirubin: 0.4 mg/dL (ref <1.2)
Total Protein: 6.8 g/dL (ref 6.5–8.1)

## 2023-07-06 LAB — GLUCOSE, CAPILLARY
Glucose-Capillary: 76 mg/dL (ref 70–99)
Glucose-Capillary: 86 mg/dL (ref 70–99)
Glucose-Capillary: 99 mg/dL (ref 70–99)
Glucose-Capillary: 102 mg/dL — ABNORMAL HIGH (ref 70–99)
Glucose-Capillary: 106 mg/dL — ABNORMAL HIGH (ref 70–99)

## 2023-07-06 LAB — CBC
HCT: 38.6 % (ref 36.0–46.0)
Hemoglobin: 12.9 g/dL (ref 12.0–15.0)
MCH: 29.5 pg (ref 26.0–34.0)
MCHC: 33.4 g/dL (ref 30.0–36.0)
MCV: 88.1 fL (ref 80.0–100.0)
Platelets: 179 10*3/uL (ref 150–400)
RBC: 4.38 MIL/uL (ref 3.87–5.11)
RDW: 14 % (ref 11.5–15.5)
WBC: 20.5 10*3/uL — ABNORMAL HIGH (ref 4.0–10.5)
nRBC: 0 % (ref 0.0–0.2)

## 2023-07-06 LAB — PROCALCITONIN: Procalcitonin: 0.13 ng/mL

## 2023-07-06 MED ORDER — VALACYCLOVIR HCL 500 MG PO TABS
1000.0000 mg | ORAL_TABLET | Freq: Three times a day (TID) | ORAL | Status: DC
  Administered 2023-07-06 – 2023-07-08 (×6): 1000 mg via ORAL
  Filled 2023-07-06 (×6): qty 2

## 2023-07-06 NOTE — Progress Notes (Signed)
Mobility Specialist Progress Note;   07/06/23 1135  Mobility  Activity Ambulated with assistance to bathroom;Ambulated with assistance in hallway  Level of Assistance Standby assist, set-up cues, supervision of patient - no hands on  Assistive Device Other (Comment) (IV pole)  Distance Ambulated (ft) 275 ft  Activity Response Tolerated well  Mobility Referral Yes  $Mobility charge 1 Mobility  Mobility Specialist Start Time (ACUTE ONLY) 1135  Mobility Specialist Stop Time (ACUTE ONLY) 1155  Mobility Specialist Time Calculation (min) (ACUTE ONLY) 20 min   Pt eager for mobility. Required no physical assistance during ambulation, SV. Requested assistance to the BR, void successful. C/o stomach pain during ambulation. Displayed SOB towards EOS. Pt returned back to bed with all needs met.  Caesar Bookman Mobility Specialist Please contact via SecureChat or Rehab Office [phone]

## 2023-07-06 NOTE — Plan of Care (Signed)
  Problem: Education: Goal: Ability to describe self-care measures that may prevent or decrease complications (Diabetes Survival Skills Education) will improve Outcome: Progressing Goal: Individualized Educational Video(s) Outcome: Progressing   Problem: Coping: Goal: Ability to adjust to condition or change in health will improve Outcome: Progressing   Problem: Fluid Volume: Goal: Ability to maintain a balanced intake and output will improve Outcome: Progressing   Problem: Health Behavior/Discharge Planning: Goal: Ability to identify and utilize available resources and services will improve Outcome: Progressing Goal: Ability to manage health-related needs will improve Outcome: Progressing   Problem: Metabolic: Goal: Ability to maintain appropriate glucose levels will improve Outcome: Progressing   Problem: Nutritional: Goal: Maintenance of adequate nutrition will improve Outcome: Progressing Goal: Progress toward achieving an optimal weight will improve Outcome: Progressing   Problem: Skin Integrity: Goal: Risk for impaired skin integrity will decrease Outcome: Progressing   Problem: Tissue Perfusion: Goal: Adequacy of tissue perfusion will improve Outcome: Progressing   Problem: Fluid Volume: Goal: Hemodynamic stability will improve Outcome: Progressing   Problem: Clinical Measurements: Goal: Diagnostic test results will improve Outcome: Progressing Goal: Signs and symptoms of infection will decrease Outcome: Progressing   Problem: Respiratory: Goal: Ability to maintain adequate ventilation will improve Outcome: Progressing   Problem: Education: Goal: Knowledge of General Education information will improve Description: Including pain rating scale, medication(s)/side effects and non-pharmacologic comfort measures Outcome: Progressing   Problem: Health Behavior/Discharge Planning: Goal: Ability to manage health-related needs will improve Outcome:  Progressing   Problem: Clinical Measurements: Goal: Ability to maintain clinical measurements within normal limits will improve Outcome: Progressing Goal: Will remain free from infection Outcome: Progressing Goal: Diagnostic test results will improve Outcome: Progressing Goal: Respiratory complications will improve Outcome: Progressing Goal: Cardiovascular complication will be avoided Outcome: Progressing

## 2023-07-06 NOTE — Discharge Instructions (Signed)

## 2023-07-06 NOTE — Progress Notes (Signed)
PROGRESS NOTE        PATIENT DETAILS Name: Margaret [name] Age: 62 y.o. Sex: female Date of Birth: 14-Nov-1960 Admit Date: 07/01/2023 Admitting Physician Briscoe Deutscher, MD ZOX:[id number], Austin Miles, MD  Brief Summary:  Patient is a 62 y.o.  female with history of HTN, DM-2, breast cancer-s/p bilateral mastectomy 2016-presented with headache/confusion-underwent LP-CSF positive for HSV-2.  Admitted and started on IV acyclovir with significant improvement in mentation-however further hospital course complicated by RUQ pain-secondary to acute calculus cholecystitis.  See below for further details.  Significant events: 11/25>> admit to [facility]  Significant studies: 11/25>> CXR: No PNA 11/26>> MRI brain: No evidence of infarct 11/26>> CTA head/neck: No LVO/hemodynamically significant stenosis 11/26>> Spot EEG: Negative for seizures 11/26>> CSF-tube 4-RBC 175-WBC 260-98% lymphocytes, protein 191-HSV II positive on biofire panel 11/27>> RUQ ultrasound: Dilated gallbladder/stones 11/28>> renal ultrasound: No hydronephrosis  Significant microbiology data: 11/26>> COVID PCR: Negative 11/26>> blood culture: No growth 11/26>> CSF culture: Pending  Procedures: 11/26>> lumbar puncture  Consults: Neurology [facility] ID  Subjective:  Patient still reporting some right upper quadrant abdominal pain, no headache, no fever, no chills, no nausea no vomiting, able to keep p.o. staff, but reports appetite is poor. Objective: Vitals: Blood pressure 133/81, pulse 92, temperature 98.1 F (36.7 C), temperature source Oral, resp. rate 18, height 5' 8.5" (1.74 m), weight 102.2 kg, SpO2 96%.   Exam: Awake Alert, Oriented X 3, No new F.N deficits, Normal affect Symmetrical Chest wall movement, Good air movement bilaterally, CTAB RRR,No Gallops,Rubs or new Murmurs, No Parasternal Heave +ve B.Sounds, Abd Soft, expected postoperative tenderness to palpation No Cyanosis, Clubbing or  edema, No new Rash or bruise     Pertinent Labs/Radiology:    Latest Ref Rng & Units 07/06/2023    3:11 AM 07/05/2023    4:47 AM 07/04/2023    4:24 AM  CBC  WBC 4.0 - 10.5 K/uL 20.5  15.0  14.8   Hemoglobin 12.0 - 15.0 g/dL 98.1  19.1  47.8   Hematocrit 36.0 - 46.0 % 38.6  42.2  39.6   Platelets 150 - 400 K/uL 179  187  185     Lab Results  Component Value Date   NA 140 07/06/2023   K 3.1 (L) 07/06/2023   CL 110 07/06/2023   CO2 23 07/06/2023      Assessment/Plan:  HSV-2 meningoencephalitis Much improved-completely awake/alert this morning IV acyclovir for now-once patient is s/p cholecystectomy-and can tolerate oral intake-well transition to Valtrex to complete a 10-day course.  Appreciate ID/neuro input.  Acute calculus cholecystitis RUQ pain slightly better Continue Rocephin/Flagyl continue to trend white blood cells, will trend procalcitonin as well This post laparoscopic cholecystectomy 11/29  AKI Combination of hemodynamically mediated kidney injury due to worsening cholecystitis and possibly from acyclovir AKI slowly improving Continue to treat underlying cholecystitis-with IV antibiotics-scheduled for cholecystectomy later today-remains on IVF Repeat electrolytes tomorrow Avoid nephrotoxic agents -Improving, close to baseline, will decrease her IV fluids  Hypokalemia Repleted  DM-2 (A1c 7.0 on 8/20) SSI while inpatient Resume oral hypoglycemic agents on discharge  Recent Labs    07/06/23 0337 07/06/23 0827 07/06/23 1210  GLUCAP 76 86 99     HTN BP stable  Continue amlodipine Hold olmesartan due to AKI.  History of breast cancer-s/p mastectomy Currently on observation-resume outpatient follow-up with oncology as previously scheduled.  8 mm meningioma left frontal convexity Incidental finding Stable for outpatient follow-up  Obesity: Estimated body mass index is 33.76 kg/m as calculated from the following:   Height as of this encounter:  5' 8.5" (1.74 m).   Weight as of this encounter: 102.2 kg.   Code status:   Code Status: Full Code   DVT Prophylaxis: enoxaparin (LOVENOX) injection 40 mg Start: 07/02/23 1500   Family Communication: None at bedside   Disposition Plan: Status is: Inpatient Remains inpatient appropriate because: Severity of illness   Planned Discharge Destination:Home health   Diet: Diet Order             Diet Carb Modified Fluid consistency: Thin; Room service appropriate? Yes  Diet effective now                     Antimicrobial agents: Anti-infectives (From admission, onward)    Start     Dose/Rate Route Frequency Ordered Stop   07/04/23 1200  acyclovir (ZOVIRAX) 820 mg in dextrose 5 % 250 mL IVPB        10 mg/kg  82.1 kg (Adjusted) 266.4 mL/hr over 60 Minutes Intravenous Every 12 hours 07/04/23 0758     07/04/23 0945  cefTRIAXone (ROCEPHIN) 2 g in sodium chloride 0.9 % 100 mL IVPB        2 g 200 mL/hr over 30 Minutes Intravenous Every 24 hours 07/04/23 0857     07/04/23 0945  metroNIDAZOLE (FLAGYL) IVPB 500 mg        500 mg 100 mL/hr over 60 Minutes Intravenous Every 12 hours 07/04/23 0858     07/02/23 1500  vancomycin (VANCOREADY) IVPB 500 mg/100 mL  Status:  Discontinued       Placed in "Followed by" Linked Group   500 mg 100 mL/hr over 60 Minutes Intravenous  Once 07/02/23 0531 07/02/23 1457   07/02/23 1400  vancomycin (VANCOREADY) IVPB 1500 mg/300 mL  Status:  Discontinued        1,500 mg 150 mL/hr over 120 Minutes Intravenous Every 12 hours 07/02/23 0526 07/02/23 0531   07/02/23 1400  vancomycin (VANCOCIN) IVPB 1000 mg/200 mL premix  Status:  Discontinued       Placed in "Followed by" Linked Group   1,000 mg 200 mL/hr over 60 Minutes Intravenous  Once 07/02/23 0531 07/02/23 1520   07/02/23 1000  acyclovir (ZOVIRAX) 820 mg in dextrose 5 % 250 mL IVPB  Status:  Discontinued        10 mg/kg  82.1 kg (Adjusted) 266.4 mL/hr over 60 Minutes Intravenous Every 8 hours  07/02/23 0259 07/04/23 0758   07/02/23 0130  acyclovir (ZOVIRAX) 1,075 mg in dextrose 5 % 250 mL IVPB  Status:  Discontinued        10 mg/kg  107.5 kg 271.5 mL/hr over 60 Minutes Intravenous Every 8 hours 07/02/23 0123 07/02/23 0259   07/02/23 0115  vancomycin (VANCOCIN) IVPB 1000 mg/200 mL premix       Placed in "Followed by" Linked Group   1,000 mg 200 mL/hr over 60 Minutes Intravenous  Once 07/02/23 0102 07/02/23 0433   07/02/23 0115  vancomycin (VANCOCIN) IVPB 1000 mg/200 mL premix       Placed in "Followed by" Linked Group   1,000 mg 200 mL/hr over 60 Minutes Intravenous  Once 07/02/23 0102 07/02/23 0354   07/02/23 0045  vancomycin (VANCOCIN) IVPB 1000 mg/200 mL premix  Status:  Discontinued        1,000 mg  200 mL/hr over 60 Minutes Intravenous  Once 07/02/23 0044 07/02/23 0102   07/02/23 0045  cefTRIAXone (ROCEPHIN) 2 g in sodium chloride 0.9 % 100 mL IVPB  Status:  Discontinued        2 g 200 mL/hr over 30 Minutes Intravenous Every 12 hours 07/02/23 0044 07/02/23 1457   07/02/23 0045  ampicillin (OMNIPEN) 2 g in sodium chloride 0.9 % 100 mL IVPB  Status:  Discontinued        2 g 300 mL/hr over 20 Minutes Intravenous Every 4 hours 07/02/23 0044 07/02/23 1457        MEDICATIONS: Scheduled Meds:  amLODipine  5 mg Oral Daily   enoxaparin (LOVENOX) injection  40 mg Subcutaneous Q24H   hyaluronidase Human  150 Units Subcutaneous Once   insulin aspart  0-6 Units Subcutaneous Q4H   sodium chloride flush  3 mL Intravenous Q12H   Continuous Infusions:  sodium chloride 125 mL/hr at 07/06/23 0618   acyclovir 820 mg (07/06/23 0051)   cefTRIAXone (ROCEPHIN)  IV 2 g (07/06/23 0831)   metronidazole 500 mg (07/06/23 1129)   PRN Meds:.acetaminophen **OR** acetaminophen, HYDROmorphone (DILAUDID) injection, labetalol, oxyCODONE, polyethylene glycol, prochlorperazine   I have personally reviewed following labs and imaging studies  LABORATORY DATA: CBC: Recent Labs  Lab  07/01/23 2330 07/02/23 0045 07/02/23 0324 07/03/23 0425 07/04/23 0424 07/05/23 0447 07/06/23 0311  WBC 17.8* 17.1* 15.7* 11.4* 14.8* 15.0* 20.5*  NEUTROABS 14.8* 13.5*  --   --   --   --   --   HGB 15.6* 14.2 14.0 14.4 13.2 14.1 12.9  HCT 48.8* 43.7 43.1 43.2 39.6 42.2 38.6  MCV 91.4 90.3 90.4 88.0 88.4 88.5 88.1  PLT 211 218 191 182 185 187 179    Basic Metabolic Panel: Recent Labs  Lab 07/02/23 0045 07/02/23 0046 07/03/23 0422 07/03/23 0425 07/04/23 0424 07/04/23 1503 07/05/23 0447 07/06/23 0311  NA  --    < >  --  137 134* 140 140 140  K  --    < >  --  2.8* 2.7* 4.3 3.1* 3.1*  CL  --    < >  --  101 101 110 109 110  CO2  --    < >  --  23 22 19* 21* 23  GLUCOSE  --    < >  --  97 132* 92 105* 103*  BUN  --    < >  --  5* 16 19 14 15   CREATININE  --    < >  --  0.79 1.80* 2.00* 1.75* 1.42*  CALCIUM  --    < >  --  9.0 8.8* 9.1 9.2 8.9  MG 1.9  --  1.6*  --   --  1.7  --  1.7   < > = values in this interval not displayed.    GFR: Estimated Creatinine Clearance: 51.8 mL/min (A) (by C-G formula based on SCr of 1.42 mg/dL (H)).  Liver Function Tests: Recent Labs  Lab 07/01/23 2330 07/02/23 0046 07/05/23 0447 07/06/23 0311  AST 25 23 33 65*  ALT 23 22 31  64*  ALKPHOS 77 74 62 60  BILITOT 0.7 0.6 0.6 0.4  PROT 8.4* 7.4 7.5 6.8  ALBUMIN 4.1 3.8 3.5 3.1*   No results for input(s): "LIPASE", "AMYLASE" in the last 168 hours. Recent Labs  Lab 07/02/23 0046  AMMONIA 23    Coagulation Profile: No results for input(s): "INR", "PROTIME" in the last  168 hours.  Cardiac Enzymes: Recent Labs  Lab 07/02/23 0045  CKTOTAL 309*    BNP (last 3 results) No results for input(s): "PROBNP" in the last 8760 hours.  Lipid Profile: No results for input(s): "CHOL", "HDL", "LDLCALC", "TRIG", "CHOLHDL", "LDLDIRECT" in the last 72 hours.  Thyroid Function Tests: No results for input(s): "TSH", "T4TOTAL", "FREET4", "T3FREE", "THYROIDAB" in the last 72  hours.   Anemia Panel: No results for input(s): "VITAMINB12", "FOLATE", "FERRITIN", "TIBC", "IRON", "RETICCTPCT" in the last 72 hours.   Urine analysis:    Component Value Date/Time   COLORURINE YELLOW 07/02/2023 0034   APPEARANCEUR CLEAR 07/02/2023 0034   LABSPEC >1.046 (H) 07/02/2023 0034   PHURINE 6.0 07/02/2023 0034   GLUCOSEU NEGATIVE 07/02/2023 0034   GLUCOSEU NEGATIVE 12/11/2022 1103   HGBUR NEGATIVE 07/02/2023 0034   BILIRUBINUR NEGATIVE 07/02/2023 0034   KETONESUR 20 (A) 07/02/2023 0034   PROTEINUR 30 (A) 07/02/2023 0034   UROBILINOGEN 1.0 12/11/2022 1103   NITRITE NEGATIVE 07/02/2023 0034   LEUKOCYTESUR NEGATIVE 07/02/2023 0034    Sepsis Labs: Lactic Acid, Venous    Component Value Date/Time   LATICACIDVEN 1.9 07/02/2023 0342    MICROBIOLOGY: Recent Results (from the past 240 hour(s))  SARS Coronavirus 2 by RT PCR (hospital order, performed in [facility] hospital lab) *cepheid single result test* Anterior Nasal Swab     Status: None   Collection Time: 07/01/23 11:30 PM   Specimen: Anterior Nasal Swab  Result Value Ref Range Status   SARS Coronavirus 2 by RT PCR NEGATIVE NEGATIVE Final    Comment: Performed at [facility], [address], [place], [place] [address]  Culture, blood (routine x 2)     Status: None (Preliminary result)   Collection Time: 07/02/23 12:42 AM   Specimen: BLOOD RIGHT ARM  Result Value Ref Range Status   Specimen Description BLOOD RIGHT ARM  Final   Special Requests   Final    BOTTLES DRAWN AEROBIC AND ANAEROBIC Blood Culture adequate volume   Culture   Final    NO GROWTH 4 DAYS Performed at [facility], [address] [place], [place] [address]    Report Status PENDING  Incomplete  Culture, blood (routine x 2)     Status: None (Preliminary result)   Collection Time: 07/02/23 12:42 AM   Specimen: BLOOD LEFT ARM  Result Value Ref Range Status   Specimen Description BLOOD LEFT ARM  Final   Special Requests   Final     BOTTLES DRAWN AEROBIC AND ANAEROBIC Blood Culture adequate volume   Culture   Final    NO GROWTH 4 DAYS Performed at [facility], [address], [place], [place] [address]    Report Status PENDING  Incomplete  CSF culture w Gram Stain     Status: None   Collection Time: 07/02/23  1:33 AM   Specimen: CSF; Cerebrospinal Fluid  Result Value Ref Range Status   Specimen Description CSF  Final   Special Requests LP  Final   Gram Stain   Final    CYTOSPIN SMEAR WBC PRESENT, PREDOMINANTLY MONONUCLEAR NO ORGANISMS SEEN    Culture   Final    NO GROWTH 3 DAYS Performed at [facility], [address], [place], [place] [address]    Report Status 07/05/2023 FINAL  Final    RADIOLOGY STUDIES/RESULTS: No results found.   LOS: 4 days   Huey Bienenstock, MD  [facility]  To contact the attending provider between 7A-7P or the covering provider during after hours 7P-7A, please log into the web site [email or web address] and access using universal [place] password for that web site. If you do not have the password, please call the hospital operator.  07/06/2023, 1:40 PM

## 2023-07-06 NOTE — Progress Notes (Signed)
1 Day Post-Op   Subjective/Chief Complaint: Doing well   Objective: Vital signs in last 24 hours: Temp:  [97.6 F (36.4 C)-98.1 F (36.7 C)] 98.1 F (36.7 C) (11/30 0800) Pulse Rate:  [90-104] 92 (11/30 0800) Resp:  [14-30] 18 (11/30 0900) BP: (126-166)/(73-99) 133/81 (11/30 0800) SpO2:  [93 %-97 %] 96 % (11/29 1700) Weight:  [102.2 kg] 102.2 kg (11/30 0500) Last BM Date : 07/04/23  Intake/Output from previous day: 11/29 0701 - 11/30 0700 In: 603 [I.V.:503; IV Piggyback:100] Out: 11 [Urine:1; Blood:10] Intake/Output this shift: No intake/output data recorded.  Abdomen: port sites CDI   Lab Results:  Recent Labs    07/05/23 0447 07/06/23 0311  WBC 15.0* 20.5*  HGB 14.1 12.9  HCT 42.2 38.6  PLT 187 179   BMET Recent Labs    07/05/23 0447 07/06/23 0311  NA 140 140  K 3.1* 3.1*  CL 109 110  CO2 21* 23  GLUCOSE 105* 103*  BUN 14 15  CREATININE 1.75* 1.42*  CALCIUM 9.2 8.9   PT/INR No results for input(s): "LABPROT", "INR" in the last 72 hours. ABG No results for input(s): "PHART", "HCO3" in the last 72 hours.  Invalid input(s): "PCO2", "PO2"  Studies/Results: US RENAL  Result Date: 07/04/2023 CLINICAL DATA:  Acute kidney injury EXAM: RENAL / URINARY TRACT ULTRASOUND COMPLETE COMPARISON:  None Available. FINDINGS: Right Kidney: Renal measurements: 13.3 x 6.4 x 5.8 cm = volume: 259 mL. Echogenicity within normal limits. No mass or hydronephrosis visualized. Left Kidney: Renal measurements: 13.0 x 6.4 x 5.7 cm = volume: 244 mL. Echogenicity within normal limits. No mass or hydronephrosis visualized. Simple appearing cysts identified with smooth margins, through transmission and is anechoic measuring 4.1 x 4.0 x 4.3 cm. Bladder: Bladder is contracted. Other: None. IMPRESSION: No collecting system dilatation.  Simple left-sided renal cysts. Electronically Signed   By: Karen Kays M.D.   On: 07/04/2023 11:50    Anti-infectives: Anti-infectives (From admission,  onward)    Start     Dose/Rate Route Frequency Ordered Stop   07/04/23 1200  acyclovir (ZOVIRAX) 820 mg in dextrose 5 % 250 mL IVPB        10 mg/kg  82.1 kg (Adjusted) 266.4 mL/hr over 60 Minutes Intravenous Every 12 hours 07/04/23 0758     07/04/23 0945  cefTRIAXone (ROCEPHIN) 2 g in sodium chloride 0.9 % 100 mL IVPB        2 g 200 mL/hr over 30 Minutes Intravenous Every 24 hours 07/04/23 0857     07/04/23 0945  metroNIDAZOLE (FLAGYL) IVPB 500 mg        500 mg 100 mL/hr over 60 Minutes Intravenous Every 12 hours 07/04/23 0858     07/02/23 1500  vancomycin (VANCOREADY) IVPB 500 mg/100 mL  Status:  Discontinued       Placed in "Followed by" Linked Group   500 mg 100 mL/hr over 60 Minutes Intravenous  Once 07/02/23 0531 07/02/23 1457   07/02/23 1400  vancomycin (VANCOREADY) IVPB 1500 mg/300 mL  Status:  Discontinued        1,500 mg 150 mL/hr over 120 Minutes Intravenous Every 12 hours 07/02/23 0526 07/02/23 0531   07/02/23 1400  vancomycin (VANCOCIN) IVPB 1000 mg/200 mL premix  Status:  Discontinued       Placed in "Followed by" Linked Group   1,000 mg 200 mL/hr over 60 Minutes Intravenous  Once 07/02/23 0531 07/02/23 1520   07/02/23 1000  acyclovir (ZOVIRAX) 820 mg in dextrose  5 % 250 mL IVPB  Status:  Discontinued        10 mg/kg  82.1 kg (Adjusted) 266.4 mL/hr over 60 Minutes Intravenous Every 8 hours 07/02/23 0259 07/04/23 0758   07/02/23 0130  acyclovir (ZOVIRAX) 1,075 mg in dextrose 5 % 250 mL IVPB  Status:  Discontinued        10 mg/kg  107.5 kg 271.5 mL/hr over 60 Minutes Intravenous Every 8 hours 07/02/23 0123 07/02/23 0259   07/02/23 0115  vancomycin (VANCOCIN) IVPB 1000 mg/200 mL premix       Placed in "Followed by" Linked Group   1,000 mg 200 mL/hr over 60 Minutes Intravenous  Once 07/02/23 0102 07/02/23 0433   07/02/23 0115  vancomycin (VANCOCIN) IVPB 1000 mg/200 mL premix       Placed in "Followed by" Linked Group   1,000 mg 200 mL/hr over 60 Minutes Intravenous   Once 07/02/23 0102 07/02/23 0354   07/02/23 0045  vancomycin (VANCOCIN) IVPB 1000 mg/200 mL premix  Status:  Discontinued        1,000 mg 200 mL/hr over 60 Minutes Intravenous  Once 07/02/23 0044 07/02/23 0102   07/02/23 0045  cefTRIAXone (ROCEPHIN) 2 g in sodium chloride 0.9 % 100 mL IVPB  Status:  Discontinued        2 g 200 mL/hr over 30 Minutes Intravenous Every 12 hours 07/02/23 0044 07/02/23 1457   07/02/23 0045  ampicillin (OMNIPEN) 2 g in sodium chloride 0.9 % 100 mL IVPB  Status:  Discontinued        2 g 300 mL/hr over 20 Minutes Intravenous Every 4 hours 07/02/23 0044 07/02/23 1457       Assessment/Plan: s/p Procedure(s): LAPAROSCOPIC CHOLECYSTECTOMY (N/A) Ok for D/C  INSTRUCTIONS IN THE CHART   LOS: 4 days    Maisie Fus A Gayanne Prescott 07/06/2023

## 2023-07-07 DIAGNOSIS — G9340 Encephalopathy, unspecified: Secondary | ICD-10-CM | POA: Diagnosis not present

## 2023-07-07 LAB — BASIC METABOLIC PANEL
Anion gap: 9 (ref 5–15)
BUN: 23 mg/dL (ref 8–23)
CO2: 23 mmol/L (ref 22–32)
Calcium: 9.2 mg/dL (ref 8.9–10.3)
Chloride: 106 mmol/L (ref 98–111)
Creatinine, Ser: 1.54 mg/dL — ABNORMAL HIGH (ref 0.44–1.00)
GFR, Estimated: 38 mL/min — ABNORMAL LOW (ref >60)
Glucose, Bld: 93 mg/dL (ref 70–99)
Potassium: 3 mmol/L — ABNORMAL LOW (ref 3.5–5.1)
Sodium: 138 mmol/L (ref 135–145)

## 2023-07-07 LAB — CULTURE, BLOOD (ROUTINE X 2)
Culture: NO GROWTH
Culture: NO GROWTH
Special Requests: ADEQUATE
Special Requests: ADEQUATE

## 2023-07-07 LAB — CBC
HCT: 39.1 % (ref 36.0–46.0)
Hemoglobin: 12.8 g/dL (ref 12.0–15.0)
MCH: 28.9 pg (ref 26.0–34.0)
MCHC: 32.7 g/dL (ref 30.0–36.0)
MCV: 88.3 fL (ref 80.0–100.0)
Platelets: 185 10*3/uL (ref 150–400)
RBC: 4.43 MIL/uL (ref 3.87–5.11)
RDW: 14 % (ref 11.5–15.5)
WBC: 15.6 10*3/uL — ABNORMAL HIGH (ref 4.0–10.5)
nRBC: 0 % (ref 0.0–0.2)

## 2023-07-07 LAB — GLUCOSE, CAPILLARY
Glucose-Capillary: 103 mg/dL — ABNORMAL HIGH (ref 70–99)
Glucose-Capillary: 114 mg/dL — ABNORMAL HIGH (ref 70–99)
Glucose-Capillary: 82 mg/dL (ref 70–99)
Glucose-Capillary: 91 mg/dL (ref 70–99)
Glucose-Capillary: 93 mg/dL (ref 70–99)
Glucose-Capillary: 96 mg/dL (ref 70–99)

## 2023-07-07 LAB — PROCALCITONIN: Procalcitonin: <0.1 ng/mL

## 2023-07-07 MED ORDER — LACTATED RINGERS IV SOLN: INTRAVENOUS | Status: AC

## 2023-07-07 MED ORDER — POTASSIUM CHLORIDE CRYS ER 20 MEQ PO TBCR
40.0000 meq | EXTENDED_RELEASE_TABLET | Freq: Four times a day (QID) | ORAL | Status: AC
  Administered 2023-07-07 (×2): 40 meq via ORAL
  Filled 2023-07-07 (×2): qty 2

## 2023-07-07 MED ORDER — AMOXICILLIN-POT CLAVULANATE 875-125 MG PO TABS
1.0000 | ORAL_TABLET | Freq: Two times a day (BID) | ORAL | Status: DC
  Administered 2023-07-07 – 2023-07-08 (×3): 1 via ORAL
  Filled 2023-07-07 (×3): qty 1

## 2023-07-07 MED ORDER — AMLODIPINE BESYLATE 5 MG PO TABS
5.0000 mg | ORAL_TABLET | Freq: Once | ORAL | Status: AC
  Administered 2023-07-07: 5 mg via ORAL
  Filled 2023-07-07: qty 1

## 2023-07-07 MED ORDER — POLYETHYLENE GLYCOL 3350 17 G PO PACK
17.0000 g | PACK | Freq: Two times a day (BID) | ORAL | Status: AC
  Administered 2023-07-07 (×2): 17 g via ORAL
  Filled 2023-07-07 (×2): qty 1

## 2023-07-07 MED ORDER — AMLODIPINE BESYLATE 10 MG PO TABS
10.0000 mg | ORAL_TABLET | Freq: Every day | ORAL | Status: DC
  Administered 2023-07-08: 10 mg via ORAL
  Filled 2023-07-07: qty 1

## 2023-07-07 NOTE — Progress Notes (Signed)
PROGRESS NOTE        PATIENT DETAILS Name: Margaret [name] Age: 62 y.o. Sex: female Date of Birth: 11-26-1960 Admit Date: 07/01/2023 Admitting Physician Briscoe Deutscher, MD [id number], Austin Miles, MD  Brief Summary:  Patient is a 62 y.o.  female with history of HTN, DM-2, breast cancer-s/p bilateral mastectomy 2016-presented with headache/confusion-underwent LP-CSF positive for HSV-2.  Admitted and started on IV acyclovir with significant improvement in mentation-however further hospital course complicated by RUQ pain-secondary to acute calculus cholecystitis.  See below for further details.  Significant events: 11/25>> admit to [facility]  Significant studies: 11/25>> CXR: No PNA 11/26>> MRI brain: No evidence of infarct 11/26>> CTA head/neck: No LVO/hemodynamically significant stenosis 11/26>> Spot EEG: Negative for seizures 11/26>> CSF-tube 4-RBC 175-WBC 260-98% lymphocytes, protein 191-HSV II positive on biofire panel 11/27>> RUQ ultrasound: Dilated gallbladder/stones 11/28>> renal ultrasound: No hydronephrosis 11/29>> laproscopic cholecystectomy  Significant microbiology data: 11/26>> COVID PCR: Negative 11/26>> blood culture: No growth 11/26>> CSF culture: Pending  Procedures: 11/26>> lumbar puncture  Consults: Neurology [facility] ID  Subjective:  Patient reports no vomiting, passing gas, but no BM yet, reports abdominal Palin still there but it is improving overall.    Objective: Vitals: Blood pressure (!) 141/83, pulse 93, temperature 99.2 F (37.3 C), temperature source Oral, resp. rate 20, height 5' 8.5" (1.74 m), weight 102.1 kg, SpO2 99%.   Exam:  Awake Alert, Oriented X 3, mildly uncomfortable due to abdominal pain Symmetrical Chest wall movement, Good air movement bilaterally, CTAB RRR,No Gallops,Rubs or new Murmurs, No Parasternal Heave +ve B.Sounds, Abd Soft, espexted postoperative tenderness. No Cyanosis, Clubbing or edema, No  new Rash or bruise      Pertinent Labs/Radiology:    Latest Ref Rng & Units 07/07/2023    4:37 AM 07/06/2023    3:11 AM 07/05/2023    4:47 AM  CBC  WBC 4.0 - 10.5 K/uL 15.6  20.5  15.0   Hemoglobin 12.0 - 15.0 g/dL 78.4  69.6  29.5   Hematocrit 36.0 - 46.0 % 39.1  38.6  42.2   Platelets 150 - 400 K/uL 185  179  187     Lab Results  Component Value Date   NA 138 07/07/2023   K 3.0 (L) 07/07/2023   CL 106 07/07/2023   CO2 23 07/07/2023      Assessment/Plan:  HSV-2 meningoencephalitis Much improved-completely awake/alert this morning IV acyclovir for now-once patient is s/p cholecystectomy-and can tolerate oral intake- transitioned to Valtrex to complete a 10-day course.  Appreciate ID/neuro input.  Acute calculus cholecystitis This post laparoscopic cholecystectomy 11/29 Blood cell count trending down Been less tender today, but still with some expected postoperative pain, but overall improving tolerating oral intake, passing gas only, no BM yet, will start on stool softener.  AKI Combination of hemodynamically mediated kidney injury due to worsening cholecystitis and possibly from acyclovir AKI slowly improving Proving, continue with IV fluids, continue to hold on losartan   Hypokalemia Repleted  DM-2 (A1c 7.0 on 8/20) SSI while inpatient Resume oral hypoglycemic agents on discharge  Recent Labs    07/07/23 0031 07/07/23 0323 07/07/23 0829  GLUCAP 103* 93 82     HTN - blood ressure started to increase, on home dose amlodipine, still elevated so we will increase to 10 mg daily  -Hold olmesartan due to AKI.  History of  breast cancer-s/p mastectomy Currently on observation-resume outpatient follow-up with oncology as previously scheduled.  8 mm meningioma left frontal convexity Incidental finding Stable for outpatient follow-up  Obesity: Estimated body mass index is 33.73 kg/m as calculated from the following:   Height as of this encounter: 5' 8.5"  (1.74 m).   Weight as of this encounter: 102.1 kg.   Code status:   Code Status: Full Code   DVT Prophylaxis: enoxaparin (LOVENOX) injection 40 mg Start: 07/02/23 1500   Family Communication: discussed with daughter at bedside   Disposition Plan: Status is: Inpatient Remains inpatient appropriate because: Severity of illness   Planned Discharge Destination:Home health   Diet: Diet Order             Diet Carb Modified Fluid consistency: Thin; Room service appropriate? Yes  Diet effective now                     Antimicrobial agents: Anti-infectives (From admission, onward)    Start     Dose/Rate Route Frequency Ordered Stop   07/07/23 1000  amoxicillin-clavulanate (AUGMENTIN) 875-125 MG per tablet 1 tablet        1 tablet Oral Every 12 hours 07/07/23 0723     07/06/23 1445  valACYclovir (VALTREX) tablet 1,000 mg        1,000 mg Oral Every 8 hours 07/06/23 1355 07/13/23 0559   07/04/23 1200  acyclovir (ZOVIRAX) 820 mg in dextrose 5 % 250 mL IVPB  Status:  Discontinued        10 mg/kg  82.1 kg (Adjusted) 266.4 mL/hr over 60 Minutes Intravenous Every 12 hours 07/04/23 0758 07/06/23 1355   07/04/23 0945  cefTRIAXone (ROCEPHIN) 2 g in sodium chloride 0.9 % 100 mL IVPB  Status:  Discontinued        2 g 200 mL/hr over 30 Minutes Intravenous Every 24 hours 07/04/23 0857 07/07/23 0723   07/04/23 0945  metroNIDAZOLE (FLAGYL) IVPB 500 mg  Status:  Discontinued        500 mg 100 mL/hr over 60 Minutes Intravenous Every 12 hours 07/04/23 0858 07/07/23 0723   07/02/23 1500  vancomycin (VANCOREADY) IVPB 500 mg/100 mL  Status:  Discontinued       Placed in "Followed by" Linked Group   500 mg 100 mL/hr over 60 Minutes Intravenous  Once 07/02/23 0531 07/02/23 1457   07/02/23 1400  vancomycin (VANCOREADY) IVPB 1500 mg/300 mL  Status:  Discontinued        1,500 mg 150 mL/hr over 120 Minutes Intravenous Every 12 hours 07/02/23 0526 07/02/23 0531   07/02/23 1400  vancomycin  (VANCOCIN) IVPB 1000 mg/200 mL premix  Status:  Discontinued       Placed in "Followed by" Linked Group   1,000 mg 200 mL/hr over 60 Minutes Intravenous  Once 07/02/23 0531 07/02/23 1520   07/02/23 1000  acyclovir (ZOVIRAX) 820 mg in dextrose 5 % 250 mL IVPB  Status:  Discontinued        10 mg/kg  82.1 kg (Adjusted) 266.4 mL/hr over 60 Minutes Intravenous Every 8 hours 07/02/23 0259 07/04/23 0758   07/02/23 0130  acyclovir (ZOVIRAX) 1,075 mg in dextrose 5 % 250 mL IVPB  Status:  Discontinued        10 mg/kg  107.5 kg 271.5 mL/hr over 60 Minutes Intravenous Every 8 hours 07/02/23 0123 07/02/23 0259   07/02/23 0115  vancomycin (VANCOCIN) IVPB 1000 mg/200 mL premix       Placed in "  Followed by" Linked Group   1,000 mg 200 mL/hr over 60 Minutes Intravenous  Once 07/02/23 0102 07/02/23 0433   07/02/23 0115  vancomycin (VANCOCIN) IVPB 1000 mg/200 mL premix       Placed in "Followed by" Linked Group   1,000 mg 200 mL/hr over 60 Minutes Intravenous  Once 07/02/23 0102 07/02/23 0354   07/02/23 0045  vancomycin (VANCOCIN) IVPB 1000 mg/200 mL premix  Status:  Discontinued        1,000 mg 200 mL/hr over 60 Minutes Intravenous  Once 07/02/23 0044 07/02/23 0102   07/02/23 0045  cefTRIAXone (ROCEPHIN) 2 g in sodium chloride 0.9 % 100 mL IVPB  Status:  Discontinued        2 g 200 mL/hr over 30 Minutes Intravenous Every 12 hours 07/02/23 0044 07/02/23 1457   07/02/23 0045  ampicillin (OMNIPEN) 2 g in sodium chloride 0.9 % 100 mL IVPB  Status:  Discontinued        2 g 300 mL/hr over 20 Minutes Intravenous Every 4 hours 07/02/23 0044 07/02/23 1457        MEDICATIONS: Scheduled Meds:  amLODipine  5 mg Oral Daily   amoxicillin-clavulanate  1 tablet Oral Q12H   enoxaparin (LOVENOX) injection  40 mg Subcutaneous Q24H   hyaluronidase Human  150 Units Subcutaneous Once   insulin aspart  0-6 Units Subcutaneous Q4H   potassium chloride  40 mEq Oral Q6H   sodium chloride flush  3 mL Intravenous Q12H    valACYclovir  1,000 mg Oral Q8H   Continuous Infusions:  lactated ringers 75 mL/hr at 07/07/23 0854   PRN Meds:.acetaminophen **OR** acetaminophen, HYDROmorphone (DILAUDID) injection, labetalol, oxyCODONE, polyethylene glycol, prochlorperazine   I have personally reviewed following labs and imaging studies  LABORATORY DATA: CBC: Recent Labs  Lab 07/01/23 2330 07/02/23 0045 07/02/23 0324 07/03/23 0425 07/04/23 0424 07/05/23 0447 07/06/23 0311 07/07/23 0437  WBC 17.8* 17.1*   < > 11.4* 14.8* 15.0* 20.5* 15.6*  NEUTROABS 14.8* 13.5*  --   --   --   --   --   --   HGB 15.6* 14.2   < > 14.4 13.2 14.1 12.9 12.8  HCT 48.8* 43.7   < > 43.2 39.6 42.2 38.6 39.1  MCV 91.4 90.3   < > 88.0 88.4 88.5 88.1 88.3  PLT 211 218   < > 182 185 187 179 185   < > = values in this interval not displayed.    Basic Metabolic Panel: Recent Labs  Lab 07/02/23 0045 07/02/23 0046 07/03/23 0422 07/03/23 0425 07/04/23 0424 07/04/23 1503 07/05/23 0447 07/06/23 0311 07/07/23 0437  NA  --    < >  --    < > 134* 140 140 140 138  K  --    < >  --    < > 2.7* 4.3 3.1* 3.1* 3.0*  CL  --    < >  --    < > 101 110 109 110 106  CO2  --    < >  --    < > 22 19* 21* 23 23  GLUCOSE  --    < >  --    < > 132* 92 105* 103* 93  BUN  --    < >  --    < > 16 19 14 15 23   CREATININE  --    < >  --    < > 1.80* 2.00* 1.75* 1.42* 1.54*  CALCIUM  --    < >  --    < >  8.8* 9.1 9.2 8.9 9.2  MG 1.9  --  1.6*  --   --  1.7  --  1.7  --    < > = values in this interval not displayed.    GFR: Estimated Creatinine Clearance: 47.8 mL/min (A) (by C-G formula based on SCr of 1.54 mg/dL (H)).  Liver Function Tests: Recent Labs  Lab 07/01/23 2330 07/02/23 0046 07/05/23 0447 07/06/23 0311  AST 25 23 33 65*  ALT 23 22 31  64*  ALKPHOS 77 74 62 60  BILITOT 0.7 0.6 0.6 0.4  PROT 8.4* 7.4 7.5 6.8  ALBUMIN 4.1 3.8 3.5 3.1*   No results for input(s): "LIPASE", "AMYLASE" in the last 168 hours. Recent Labs  Lab  07/02/23 0046  AMMONIA 23    Coagulation Profile: No results for input(s): "INR", "PROTIME" in the last 168 hours.  Cardiac Enzymes: Recent Labs  Lab 07/02/23 0045  CKTOTAL 309*    BNP (last 3 results) No results for input(s): "PROBNP" in the last 8760 hours.  Lipid Profile: No results for input(s): "CHOL", "HDL", "LDLCALC", "TRIG", "CHOLHDL", "LDLDIRECT" in the last 72 hours.  Thyroid Function Tests: No results for input(s): "TSH", "T4TOTAL", "FREET4", "T3FREE", "THYROIDAB" in the last 72 hours.   Anemia Panel: No results for input(s): "VITAMINB12", "FOLATE", "FERRITIN", "TIBC", "IRON", "RETICCTPCT" in the last 72 hours.   Urine analysis:    Component Value Date/Time   COLORURINE YELLOW 07/02/2023 0034   APPEARANCEUR CLEAR 07/02/2023 0034   LABSPEC >1.046 (H) 07/02/2023 0034   PHURINE 6.0 07/02/2023 0034   GLUCOSEU NEGATIVE 07/02/2023 0034   GLUCOSEU NEGATIVE 12/11/2022 1103   HGBUR NEGATIVE 07/02/2023 0034   BILIRUBINUR NEGATIVE 07/02/2023 0034   KETONESUR 20 (A) 07/02/2023 0034   PROTEINUR 30 (A) 07/02/2023 0034   UROBILINOGEN 1.0 12/11/2022 1103   NITRITE NEGATIVE 07/02/2023 0034   LEUKOCYTESUR NEGATIVE 07/02/2023 0034    Sepsis Labs: Lactic Acid, Venous    Component Value Date/Time   LATICACIDVEN 1.9 07/02/2023 0342    MICROBIOLOGY: Recent Results (from the past 240 hour(s))  SARS Coronavirus 2 by RT PCR (hospital order, performed in [facility] hospital lab) *cepheid single result test* Anterior Nasal Swab     Status: None   Collection Time: 07/01/23 11:30 PM   Specimen: Anterior Nasal Swab  Result Value Ref Range Status   SARS Coronavirus 2 by RT PCR NEGATIVE NEGATIVE Final    Comment: Performed at [facility], [address] [place], [place] [address]  Culture, blood (routine x 2)     Status: None   Collection Time: 07/02/23 12:42 AM   Specimen: BLOOD RIGHT ARM  Result Value Ref Range Status   Specimen Description BLOOD RIGHT ARM   Final   Special Requests   Final    BOTTLES DRAWN AEROBIC AND ANAEROBIC Blood Culture adequate volume   Culture   Final    NO GROWTH 5 DAYS Performed at [facility], [address], [place], [place] [address]    Report Status 07/07/2023 FINAL  Final  Culture, blood (routine x 2)     Status: None   Collection Time: 07/02/23 12:42 AM   Specimen: BLOOD LEFT ARM  Result Value Ref Range Status   Specimen Description BLOOD LEFT ARM  Final   Special Requests   Final    BOTTLES DRAWN AEROBIC AND ANAEROBIC Blood Culture adequate volume   Culture   Final    NO GROWTH 5 DAYS Performed at [facility], [address] [place], [place] [address]    Report Status 07/07/2023 FINAL  Final  CSF culture w Gram Stain     Status: None   Collection Time: 07/02/23  1:33 AM   Specimen: CSF; Cerebrospinal Fluid  Result Value Ref Range Status   Specimen Description CSF  Final   Special Requests LP  Final   Gram Stain   Final    CYTOSPIN SMEAR WBC PRESENT, PREDOMINANTLY MONONUCLEAR NO ORGANISMS SEEN    Culture   Final    NO GROWTH 3 DAYS Performed at [facility], [address] [place], [place] [address]    Report Status 07/05/2023 FINAL  Final    RADIOLOGY STUDIES/RESULTS: No results found.   LOS: 5 days   Huey Bienenstock, MD  [facility]    To contact the attending provider between 7A-7P or the covering provider during after hours 7P-7A, please log into the web site [email or web address] and access using universal [place] password for that web site. If you do not have the password, please call the hospital operator.  07/07/2023, 11:39 AM

## 2023-07-07 NOTE — Progress Notes (Signed)
2 Days Post-Op   Subjective/Chief Complaint:  Feels better  Objective: Vital signs in last 24 hours: Temp:  [99 F (37.2 C)-99.2 F (37.3 C)] 99.2 F (37.3 C) (12/01 0817) Pulse Rate:  [91-93] 93 (12/01 0817) Resp:  [11-20] 20 (12/01 0817) BP: (141-151)/(83-90) 141/83 (12/01 0817) SpO2:  [96 %-99 %] 99 % (12/01 0817) Weight:  [102.1 kg] 102.1 kg (12/01 0500) Last BM Date : 07/04/23  Intake/Output from previous day: No intake/output data recorded. Intake/Output this shift: No intake/output data recorded.  Abdomen port sites CDI sore   Lab Results:  Recent Labs    07/06/23 0311 07/07/23 0437  WBC 20.5* 15.6*  HGB 12.9 12.8  HCT 38.6 39.1  PLT 179 185   BMET Recent Labs    07/06/23 0311 07/07/23 0437  NA 140 138  K 3.1* 3.0*  CL 110 106  CO2 23 23  GLUCOSE 103* 93  BUN 15 23  CREATININE 1.42* 1.54*  CALCIUM 8.9 9.2   PT/INR No results for input(s): "LABPROT", "INR" in the last 72 hours. ABG No results for input(s): "PHART", "HCO3" in the last 72 hours.  Invalid input(s): "PCO2", "PO2"  Studies/Results: No results found.  Anti-infectives: Anti-infectives (From admission, onward)    Start     Dose/Rate Route Frequency Ordered Stop   07/07/23 1000  amoxicillin-clavulanate (AUGMENTIN) 875-125 MG per tablet 1 tablet        1 tablet Oral Every 12 hours 07/07/23 0723     07/06/23 1445  valACYclovir (VALTREX) tablet 1,000 mg        1,000 mg Oral Every 8 hours 07/06/23 1355 07/13/23 0559   07/04/23 1200  acyclovir (ZOVIRAX) 820 mg in dextrose 5 % 250 mL IVPB  Status:  Discontinued        10 mg/kg  82.1 kg (Adjusted) 266.4 mL/hr over 60 Minutes Intravenous Every 12 hours 07/04/23 0758 07/06/23 1355   07/04/23 0945  cefTRIAXone (ROCEPHIN) 2 g in sodium chloride 0.9 % 100 mL IVPB  Status:  Discontinued        2 g 200 mL/hr over 30 Minutes Intravenous Every 24 hours 07/04/23 0857 07/07/23 0723   07/04/23 0945  metroNIDAZOLE (FLAGYL) IVPB 500 mg  Status:   Discontinued        500 mg 100 mL/hr over 60 Minutes Intravenous Every 12 hours 07/04/23 0858 07/07/23 0723   07/02/23 1500  vancomycin (VANCOREADY) IVPB 500 mg/100 mL  Status:  Discontinued       Placed in "Followed by" Linked Group   500 mg 100 mL/hr over 60 Minutes Intravenous  Once 07/02/23 0531 07/02/23 1457   07/02/23 1400  vancomycin (VANCOREADY) IVPB 1500 mg/300 mL  Status:  Discontinued        1,500 mg 150 mL/hr over 120 Minutes Intravenous Every 12 hours 07/02/23 0526 07/02/23 0531   07/02/23 1400  vancomycin (VANCOCIN) IVPB 1000 mg/200 mL premix  Status:  Discontinued       Placed in "Followed by" Linked Group   1,000 mg 200 mL/hr over 60 Minutes Intravenous  Once 07/02/23 0531 07/02/23 1520   07/02/23 1000  acyclovir (ZOVIRAX) 820 mg in dextrose 5 % 250 mL IVPB  Status:  Discontinued        10 mg/kg  82.1 kg (Adjusted) 266.4 mL/hr over 60 Minutes Intravenous Every 8 hours 07/02/23 0259 07/04/23 0758   07/02/23 0130  acyclovir (ZOVIRAX) 1,075 mg in dextrose 5 % 250 mL IVPB  Status:  Discontinued  10 mg/kg  107.5 kg 271.5 mL/hr over 60 Minutes Intravenous Every 8 hours 07/02/23 0123 07/02/23 0259   07/02/23 0115  vancomycin (VANCOCIN) IVPB 1000 mg/200 mL premix       Placed in "Followed by" Linked Group   1,000 mg 200 mL/hr over 60 Minutes Intravenous  Once 07/02/23 0102 07/02/23 0433   07/02/23 0115  vancomycin (VANCOCIN) IVPB 1000 mg/200 mL premix       Placed in "Followed by" Linked Group   1,000 mg 200 mL/hr over 60 Minutes Intravenous  Once 07/02/23 0102 07/02/23 0354   07/02/23 0045  vancomycin (VANCOCIN) IVPB 1000 mg/200 mL premix  Status:  Discontinued        1,000 mg 200 mL/hr over 60 Minutes Intravenous  Once 07/02/23 0044 07/02/23 0102   07/02/23 0045  cefTRIAXone (ROCEPHIN) 2 g in sodium chloride 0.9 % 100 mL IVPB  Status:  Discontinued        2 g 200 mL/hr over 30 Minutes Intravenous Every 12 hours 07/02/23 0044 07/02/23 1457   07/02/23 0045   ampicillin (OMNIPEN) 2 g in sodium chloride 0.9 % 100 mL IVPB  Status:  Discontinued        2 g 300 mL/hr over 20 Minutes Intravenous Every 4 hours 07/02/23 0044 07/02/23 1457       Assessment/Plan: s/p Procedure(s): LAPAROSCOPIC CHOLECYSTECTOMY (N/A) Stable fro D/C when pt ready    LOS: 5 days    Dortha Schwalbe MD  07/07/2023

## 2023-07-07 NOTE — Progress Notes (Addendum)
Occupational Therapy Treatment Patient Details Name: Margaret [name] MRN: [id number] DOB: 1960-11-14 Today's Date: 07/07/2023   History of present illness 62 y.o. female who presents with headache and confusion. Pt scheduled for LAPAROSCOPIC CHOLECYSTECTOMY on 06/25/23. Past medical history significant for hypertension, diabetes mellitus, depression, BMI 36, and history of breast cancer status post bilateral mastectomy   OT comments  Pt. Seen for skilled OT treatment session. Pt. Agreeable to participation.  Pt. Able to complete bed mobility in/out of bed without use of rails or hob elevated with S.  Pt. Amb. To/from b.room with intermittent furniture walking.  Stood for hand washing with S.  Moving well even with c/o stomach pain.  Eager for home when able.        If plan is discharge home, recommend the following:      Equipment Recommendations  None recommended by OT    Recommendations for Other Services      Precautions / Restrictions Precautions Precautions: Fall       Mobility Bed Mobility Overal bed mobility: Modified Independent             General bed mobility comments: no rails, hob lowered to home height. pt. able to transition for supine to sit/sit/supine without assistance    Transfers Overall transfer level: Independent Equipment used: None                     Balance                                           ADL either performed or assessed with clinical judgement   ADL Overall ADL's : Needs assistance/impaired     Grooming: Standing;Modified independent                   Toilet Transfer: Supervision/safety;Ambulation;Regular Toilet;Grab bars   Toileting- Clothing Manipulation and Hygiene: Modified independent;Sit to/from stand;Sitting/lateral lean         General ADL Comments: amb. in room with intermittent furniture walking, educated on safe surfaces vs unsafe surfaces to hold onto    [employer] [facility]  Assessment              Vision       Perception     Praxis      Cognition Arousal: Alert Behavior During Therapy: WFL for tasks assessed/performed Overall Cognitive Status: Within Functional Limits for tasks assessed                                          Exercises      Shoulder Instructions       General Comments      Pertinent Vitals/ Pain       Pain Assessment Pain Assessment: Faces Faces Pain Scale: Hurts little more Pain Location: stomach Pain Descriptors / Indicators: Aching Pain Intervention(s): Limited activity within patient's tolerance, Monitored during session, Repositioned  Home Living                                          Prior Functioning/Environment              Frequency  Min 1X/week  Progress Toward Goals  OT Goals(current goals can now be found in the care plan section)  Progress towards OT goals: Progressing toward goals     Plan      Co-evaluation                 AM-PAC OT "6 Clicks" Daily Activity     Outcome Measure   Help from another person eating meals?: None Help from another person taking care of personal grooming?: None Help from another person toileting, which includes using toliet, bedpan, or urinal?: None Help from another person bathing (including washing, rinsing, drying)?: None Help from another person to put on and taking off regular upper body clothing?: None Help from another person to put on and taking off regular lower body clothing?: None 6 Click Score: 24    End of Session    OT Visit Diagnosis: Other symptoms and signs involving cognitive function   Activity Tolerance Patient tolerated treatment well   Patient Left in bed;with call bell/phone within reach   Nurse Communication          Time: 6063-0160 OT Time Calculation (min): 12 min  Charges: OT General Charges $OT Visit: 1 Visit OT Treatments $Self Care/Home Management : 8-22  mins  Boneta Lucks, COTA/L Acute Rehabilitation [phone]   Alessandra Bevels Lorraine-COTA/L 07/07/2023, 1:38 PM

## 2023-07-08 ENCOUNTER — Encounter (HOSPITAL_COMMUNITY): Payer: Self-pay | Admitting: General Surgery

## 2023-07-08 ENCOUNTER — Other Ambulatory Visit (HOSPITAL_COMMUNITY): Payer: Self-pay

## 2023-07-08 ENCOUNTER — Other Ambulatory Visit: Payer: Self-pay

## 2023-07-08 DIAGNOSIS — E119 Type 2 diabetes mellitus without complications: Secondary | ICD-10-CM | POA: Diagnosis not present

## 2023-07-08 DIAGNOSIS — G9340 Encephalopathy, unspecified: Secondary | ICD-10-CM | POA: Diagnosis not present

## 2023-07-08 DIAGNOSIS — G039 Meningitis, unspecified: Secondary | ICD-10-CM | POA: Diagnosis not present

## 2023-07-08 LAB — BASIC METABOLIC PANEL
Anion gap: 9 (ref 5–15)
BUN: 16 mg/dL (ref 8–23)
CO2: 25 mmol/L (ref 22–32)
Calcium: 9.4 mg/dL (ref 8.9–10.3)
Chloride: 105 mmol/L (ref 98–111)
Creatinine, Ser: 0.96 mg/dL (ref 0.44–1.00)
GFR, Estimated: >60 mL/min (ref >60)
Glucose, Bld: 85 mg/dL (ref 70–99)
Potassium: 3.5 mmol/L (ref 3.5–5.1)
Sodium: 139 mmol/L (ref 135–145)

## 2023-07-08 LAB — GLUCOSE, CAPILLARY
Glucose-Capillary: 114 mg/dL — ABNORMAL HIGH (ref 70–99)
Glucose-Capillary: 93 mg/dL (ref 70–99)
Glucose-Capillary: 95 mg/dL (ref 70–99)

## 2023-07-08 LAB — CBC
HCT: 41.4 % (ref 36.0–46.0)
Hemoglobin: 13.8 g/dL (ref 12.0–15.0)
MCH: 29.6 pg (ref 26.0–34.0)
MCHC: 33.3 g/dL (ref 30.0–36.0)
MCV: 88.7 fL (ref 80.0–100.0)
Platelets: 209 10*3/uL (ref 150–400)
RBC: 4.67 MIL/uL (ref 3.87–5.11)
RDW: 14.1 % (ref 11.5–15.5)
WBC: 12.1 10*3/uL — ABNORMAL HIGH (ref 4.0–10.5)
nRBC: 0 % (ref 0.0–0.2)

## 2023-07-08 LAB — SURGICAL PATHOLOGY

## 2023-07-08 MED ORDER — TRAMADOL HCL 50 MG PO TABS
50.0000 mg | ORAL_TABLET | Freq: Four times a day (QID) | ORAL | Status: DC | PRN
  Administered 2023-07-08: 50 mg via ORAL
  Filled 2023-07-08: qty 1

## 2023-07-08 MED ORDER — VALACYCLOVIR HCL 1 G PO TABS
1000.0000 mg | ORAL_TABLET | Freq: Three times a day (TID) | ORAL | 0 refills | Status: DC
  Filled 2023-07-08 (×2): qty 24, 8d supply, fill #0

## 2023-07-08 MED ORDER — DOCUSATE SODIUM 100 MG PO CAPS
100.0000 mg | ORAL_CAPSULE | Freq: Two times a day (BID) | ORAL | Status: DC
  Administered 2023-07-08: 100 mg via ORAL
  Filled 2023-07-08: qty 1

## 2023-07-08 MED ORDER — ATORVASTATIN CALCIUM 20 MG PO TABS: 20.0000 mg | ORAL_TABLET | Freq: Every day | ORAL | Status: DC

## 2023-07-08 MED ORDER — HYDROMORPHONE HCL 1 MG/ML IJ SOLN
0.5000 mg | INTRAMUSCULAR | Status: DC | PRN
Start: 2023-07-08 — End: 2023-07-08

## 2023-07-08 MED ORDER — HYDROCODONE-ACETAMINOPHEN 5-325 MG PO TABS
1.0000 | ORAL_TABLET | Freq: Four times a day (QID) | ORAL | 0 refills | Status: AC | PRN
  Filled 2023-07-08: qty 10, 3d supply, fill #0

## 2023-07-08 MED ORDER — OXYCODONE HCL 5 MG PO TABS: 5.0000 mg | ORAL_TABLET | ORAL | Status: DC | PRN

## 2023-07-08 MED ORDER — ACETAMINOPHEN 650 MG RE SUPP: 650.0000 mg | Freq: Four times a day (QID) | RECTAL | Status: DC

## 2023-07-08 MED ORDER — ACETAMINOPHEN 325 MG PO TABS
650.0000 mg | ORAL_TABLET | Freq: Four times a day (QID) | ORAL | Status: DC
  Administered 2023-07-08: 650 mg via ORAL
  Filled 2023-07-08: qty 2

## 2023-07-08 MED ORDER — AMOXICILLIN-POT CLAVULANATE 875-125 MG PO TABS
1.0000 | ORAL_TABLET | Freq: Two times a day (BID) | ORAL | 0 refills | Status: DC
  Filled 2023-07-08: qty 4, 2d supply, fill #0

## 2023-07-08 NOTE — TOC Transition Note (Signed)
Transition of Care [facility]) - CM/SW Discharge Note   Patient Details  Name: Margaret [name] MRN: [id number] Date of Birth: 10-11-1960  [facility] CM/SW Contact:  Gordy Clement, RN Phone Number: 07/08/2023, 10:10 AM   Clinical Narrative:     Patient will DC to home with Family- No TOC needs identified           Patient Goals and CMS Choice      Discharge Placement                         Discharge Plan and Services Additional resources added to the After Visit Summary for                                       Social Determinants of Health (SDOH) Interventions SDOH Screenings   Food Insecurity: No Food Insecurity (07/03/2023)  Housing: Low Risk  (07/03/2023)  Transportation Needs: No Transportation Needs (07/03/2023)  Utilities: Not At Risk (07/03/2023)  Depression (PHQ2-9): Low Risk  (12/11/2022)  Financial Resource Strain: Low Risk  (09/19/2022)  Physical Activity: Inactive (09/19/2022)  Social Connections: Moderately Isolated (09/19/2022)  Stress: No Stress Concern Present (09/19/2022)  Tobacco Use: Medium Risk (07/05/2023)     Readmission Risk Interventions     No data to display

## 2023-07-08 NOTE — Plan of Care (Signed)
Patient is discharging home with family.

## 2023-07-08 NOTE — Progress Notes (Signed)
Discharge instructions reviewed with pt.  Copy of instructions given to pt. [facility] filled scripts for pt and meds were delivered to pt in her room.  Pt will be d/c'd via wheelchair with belongings, with her brother that will be coming soon.             Daurice Ovando,RN SWOT

## 2023-07-08 NOTE — Plan of Care (Signed)
Plan of care was reviewed. Pt has been progressing. She is stable hemodynamically, afebrile, no distress noted overnight. Right abdominal pain is tolerated and controlled with Oxycodone and dilaudid PRN. Pt is able to rest well at night.  Problem: Education: Goal: Ability to describe self-care measures that may prevent or decrease complications (Diabetes Survival Skills Education) will improve Outcome: Progressing Goal: Individualized Educational Video(s) Outcome: Progressing   Problem: Coping: Goal: Ability to adjust to condition or change in health will improve Outcome: Progressing   Problem: Fluid Volume: Goal: Ability to maintain a balanced intake and output will improve Outcome: Progressing   Problem: Health Behavior/Discharge Planning: Goal: Ability to identify and utilize available resources and services will improve Outcome: Progressing Goal: Ability to manage health-related needs will improve Outcome: Progressing   Problem: Metabolic: Goal: Ability to maintain appropriate glucose levels will improve Outcome: Progressing   Problem: Nutritional: Goal: Maintenance of adequate nutrition will improve Outcome: Progressing Goal: Progress toward achieving an optimal weight will improve Outcome: Progressing   Problem: Respiratory: Goal: Ability to maintain adequate ventilation will improve Outcome: Progressing   Problem: Clinical Measurements: Goal: Ability to maintain clinical measurements within normal limits will improve Outcome: Progressing Goal: Will remain free from infection Outcome: Progressing Goal: Diagnostic test results will improve Outcome: Progressing Goal: Respiratory complications will improve Outcome: Progressing Goal: Cardiovascular complication will be avoided Outcome: Progressing  Filiberto Pinks, RN

## 2023-07-08 NOTE — Anesthesia Postprocedure Evaluation (Signed)
Anesthesia Post Note  Patient: Margaret [name]  Procedure(s) Performed: LAPAROSCOPIC CHOLECYSTECTOMY (Abdomen)     Patient location during evaluation: Other Anesthesia Type: General Level of consciousness: awake and alert Pain management: pain level controlled Vital Signs Assessment: post-procedure vital signs reviewed and stable Respiratory status: spontaneous breathing, nonlabored ventilation, respiratory function stable and patient connected to nasal cannula oxygen Cardiovascular status: blood pressure returned to baseline and stable Postop Assessment: no apparent nausea or vomiting Anesthetic complications: no  No notable events documented.  Last Vitals:  Vitals:   07/08/23 0700 07/08/23 0830  BP:    Pulse:    Resp:    Temp: 36.8 C 36.8 C  SpO2:      Last Pain:  Vitals:   07/08/23 0830  TempSrc: Oral  PainSc:                  [name]

## 2023-07-08 NOTE — Discharge Summary (Signed)
Physician Discharge Summary  Margaret [name] NWG:[id number] DOB: Dec 16, 1960 DOA: 07/01/2023  PCP: [name], MD  Admit date: 07/01/2023 Discharge date: 07/08/2023  Admitted From: (Home) Disposition:  (Home)  Recommendations for Outpatient Follow-up:  Follow up with PCP in 1-2 weeks Please obtain BMP/CBC in one week Patient to follow with general surgery as an outpatient Routine outpatient referral for neurosurgery given incidental findings of 8 mm meningioma left frontal convexity  Diet recommendation: Heart Healthy / Carb Modified Brief/Interim Summary:  Patient is a 62 y.o.  female with history of HTN, DM-2, breast cancer-s/p bilateral mastectomy 2016-presented with headache/confusion-underwent LP-CSF positive for HSV-2.  Admitted and started on IV acyclovir with significant improvement in mentation-however further hospital course complicated by RUQ pain-secondary to acute calculus cholecystitis.  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   Significant events: 11/25>> admit to [facility]   Significant studies: 11/25>> CXR: No PNA 11/26>> MRI brain: No evidence of infarct 11/26>> CTA head/neck: No LVO/hemodynamically significant stenosis 11/26>> Spot EEG: Negative for seizures 11/26>> CSF-tube 4-RBC 175-WBC 260-98% lymphocytes, protein 191-HSV II positive on biofire panel 11/27>> RUQ ultrasound: Dilated gallbladder/stones 11/28>> renal ultrasound: No hydronephrosis 11/29>> laproscopic cholecystectomy   Significant microbiology data: 11/26>> COVID PCR: Negative 11/26>> blood culture: No growth 11/26>> CSF culture: Pending   Procedures: 11/26>> lumbar puncture   Consults: Neurology [facility] ID  HSV-2 meningoencephalitis Mentation much improved, she is back to baseline treated initially on IV acyclovir, now she is s/p  cholecystectomy he was transitioned to oral Valtrex to finish total of 10 days Appreciate ID/neuro input.   Acute calculus cholecystitis This post laparoscopic cholecystectomy 11/29, pain, nausea, vomiting has much improved, she was initially on IV Rocephin and Flagyl, she will be discharged on couple days of Augmentin as an outpatient, she is to follow with general surgery as an outpatient    AKI Combination of hemodynamically mediated kidney injury due to worsening cholecystitis and possibly from acyclovir Resolved     Hypokalemia Repleted   DM-2 (A1c 7.0 on 8/20) Resume home regimen on discharge   HTN -Resume home meds on discharge   History of breast cancer-s/p mastectomy Currently on observation-resume outpatient follow-up with oncology as previously scheduled.   8 mm meningioma left frontal convexity Incidental finding Stable for outpatient follow-up   Obesity: Estimated body mass index is 33.73 kg/m as calculated from the following:   Height as of this encounter: 5' 8.5" (1.74 m).   Weight as of this encounter: 102.1 kg.   Discharge Diagnoses:  Principal Problem:   Acute encephalopathy Active Problems:   Hypertension, essential   Adjustment disorder with mixed anxiety and depressed mood   Non-insulin dependent type 2 diabetes mellitus (HCC)   SIRS (systemic inflammatory response syndrome) (HCC)   Hypokalemia    Discharge Instructions  Discharge Instructions     Diet - low sodium heart healthy   Complete by: As directed    Discharge instructions   Complete by: As directed    Follow with Primary MD [name], MD in 7 days   Get CBC, CMP, checked  by Primary MD next visit.    Activity: As tolerated with Full fall precautions use walker/cane & assistance as needed   Disposition Home    Diet: Heart Healthy /carb modified   On your next visit with your primary care physician please Get Medicines reviewed and adjusted.  Please  request your Prim.MD to go over all Hospital Tests and Procedure/Radiological results at the follow up, please get all Hospital records sent to your Prim MD by signing hospital release before you go home.   If you experience worsening of your admission symptoms, develop shortness of breath, life threatening emergency, suicidal or homicidal thoughts you must seek medical attention immediately by calling 911 or calling your MD immediately  if symptoms less severe.  You Must read complete instructions/literature along with all the possible adverse reactions/side effects for all the Medicines you take and that have been prescribed to you. Take any new Medicines after you have completely understood and accpet all the possible adverse reactions/side effects.   Do not drive, operating heavy machinery, perform activities at heights, swimming or participation in water activities or provide baby sitting services if your were admitted for syncope or siezures until you have seen by Primary MD or a Neurologist and advised to do so again.  Do not drive when taking Pain medications.    Do not take more than prescribed Pain, Sleep and Anxiety Medications  Special Instructions: If you have smoked or chewed Tobacco  in the last 2 yrs please stop smoking, stop any regular Alcohol  and or any Recreational drug use.  Wear Seat belts while driving.   Please note  You were cared for by a hospitalist during your hospital stay. If you have any questions about your discharge medications or the care you received while you were in the hospital after you are discharged, you can call the unit and asked to speak with the hospitalist on call if the hospitalist that took care of you is not available. Once you are discharged, your primary care physician will handle any further medical issues. Please note that NO REFILLS for any discharge medications will be authorized once you are discharged, as it is imperative that you return  to your primary care physician (or establish a relationship with a primary care physician if you do not have one) for your aftercare needs so that they can reassess your need for medications and monitor your lab values.   Increase activity slowly   Complete by: As directed       Allergies as of 07/08/2023       Reactions   Banana Hives   Tongue itching   Latex Itching, Other (See Comments)   burning   Peanuts [peanut Oil] Hives   Patient is allergic to all tree nuts   Wheat Hives        Medication List     STOP taking these medications    ibuprofen 800 MG tablet Commonly known as: ADVIL       TAKE these medications    amoxicillin-clavulanate 875-125 MG tablet Commonly known as: AUGMENTIN Take 1 tablet by mouth every 12 (twelve) hours.   atorvastatin 20 MG tablet Commonly known as: LIPITOR Take 1 tablet (20 mg total) by mouth daily. Annual appt due in August must see provider for future refills Start taking on: July 18, 2023 What changed: These instructions start on July 18, 2023. If you are unsure what to do until then, ask your doctor or other care provider.   baclofen 20 MG tablet Commonly known as: LIORESAL Take 1 tablet (20 mg total) by mouth 3 (three) times daily.   Blood Glucose Monitor System w/Device Kit Use to test blood sugar 3 times a day   busPIRone 5 MG tablet Commonly known as: BUSPAR Take 1  tablet (5 mg total) by mouth daily as needed.   clobetasol cream 0.05 % Commonly known as: TEMOVATE Apply 1 Application topically 2 (two) times daily.   gabapentin 300 MG capsule Commonly known as: NEURONTIN Take 2 capsules (600 mg total) by mouth 3 (three) times daily.   glimepiride 4 MG tablet Commonly known as: AMARYL Take 1 tablet (4 mg total) by mouth daily before breakfast.   HYDROcodone-acetaminophen 5-325 MG tablet Commonly known as: NORCO/VICODIN Take 1 tablet by mouth every 6 (six) hours as needed for up to 5 days for moderate pain  (pain score 4-6).   meloxicam 15 MG tablet Commonly known as: MOBIC Take 1 tablet (15 mg total) by mouth daily.   Olmesartan-amLODIPine-HCTZ 20-5-12.5 MG Tabs Take 1 tablet by mouth daily.   OneTouch Delica Plus Lancet33G Misc Use to check blood sugars twice a day   OneTouch Verio test strip Generic drug: glucose blood Use to test blood sugar 2 times a day   pantoprazole 40 MG tablet Commonly known as: PROTONIX TAKE 1 TABLET BY MOUTH ONCE DAILY   potassium chloride SA 20 MEQ tablet Commonly known as: KLOR-CON M Take 1 tablet (20 mEq total) by mouth 2 (two) times daily.   Semaglutide (1 MG/DOSE) 4 MG/3ML Sopn Inject 1 mg as directed once a week.   Ozempic (2 MG/DOSE) 8 MG/3ML Sopn Generic drug: Semaglutide (2 MG/DOSE) Inject 2 mg as directed once a week.   traMADol 50 MG tablet Commonly known as: ULTRAM Take 1 tablet (50 mg total) by mouth daily as needed.   traZODone 100 MG tablet Commonly known as: DESYREL Take 1 tablet (100 mg total) by mouth at bedtime.   valACYclovir 1000 MG tablet Commonly known as: VALTREX Take 1 tablet (1,000 mg total) by mouth every 8 (eight) hours for 8 days. What changed: when to take this   venlafaxine XR 150 MG 24 hr capsule Commonly known as: EFFEXOR-XR Take 1 capsule (150 mg total) by mouth daily with breakfast.        Follow-up Information     Kinsinger, De Blanch, MD. Call in 3 week(s).   Specialty: General Surgery Why: We are working on your appointment,call to confirm, Arrive early to check in, fill out paperwork, Bring photo ID and insurance information Contact information: [address] [place] [address] [phone]                Allergies  Allergen Reactions   Banana Hives    Tongue itching   Latex Itching and Other (See Comments)    burning   Peanuts [Peanut Oil] Hives    Patient is allergic to all tree nuts   Wheat Hives     Procedures/Studies: US RENAL  Result  Date: 07-28-23 CLINICAL DATA:  Acute kidney injury EXAM: RENAL / URINARY TRACT ULTRASOUND COMPLETE COMPARISON:  None Available. FINDINGS: Right Kidney: Renal measurements: 13.3 x 6.4 x 5.8 cm = volume: 259 mL. Echogenicity within normal limits. No mass or hydronephrosis visualized. Left Kidney: Renal measurements: 13.0 x 6.4 x 5.7 cm = volume: 244 mL. Echogenicity within normal limits. No mass or hydronephrosis visualized. Simple appearing cysts identified with smooth margins, through transmission and is anechoic measuring 4.1 x 4.0 x 4.3 cm. Bladder: Bladder is contracted. Other: None. IMPRESSION: No collecting system dilatation.  Simple left-sided renal cysts. Electronically Signed   By: Karen Kays M.D.   On: 2023-07-28 11:50   US Abdomen Limited RUQ (LIVER/GB)  Result Date:  07/03/2023 CLINICAL DATA:  Right upper quadrant pain. EXAM: ULTRASOUND ABDOMEN LIMITED RIGHT UPPER QUADRANT COMPARISON:  PET-CT scan 05/24/2015. FINDINGS: Gallbladder: Dilated gallbladder. Borderline wall thickening of 3 mm. No adjacent fluid. Layering sludge and probable stones with some shadowing. No reported sonographic Murphy's sign Common bile duct: Diameter: 3 mm Liver: No focal lesion identified. Within normal limits in parenchymal echogenicity. Portal vein is patent on color Doppler imaging with normal direction of blood flow towards the liver. Other: None. IMPRESSION: Dilated gallbladder with layering sludge and probable stones. Borderline wall thickening but no adjacent fluid or reported sonographic Murphy's sign. No biliary ductal dilatation. If there is further concern of acute gallbladder pathology, HIDA scan could be of some benefit as clinically appropriate Electronically Signed   By: Karen Kays M.D.   On: 07/03/2023 12:27   MR BRAIN WO CONTRAST  Result Date: 07/02/2023 CLINICAL DATA:  Headache, fever.  Meningitis/CNS infection suspected EXAM: MRI HEAD WITHOUT CONTRAST TECHNIQUE: Multiplanar, multiecho pulse  sequences of the brain and surrounding structures were obtained without intravenous contrast. COMPARISON:  CTA of the head neck from earlier today. FINDINGS: Truncated and motion degraded study. No evidence of infarct or purulence. No hydrocephalus or shift. IMPRESSION: Truncated and motion degraded study. No evidence of infarct or purulence by diffusion imaging. No hydrocephalus or shift. Electronically Signed   By: Tiburcio Pea M.D.   On: 07/02/2023 10:27   EEG adult  Result Date: 07/02/2023 Charlsie Quest, MD     07/02/2023  8:51 AM Patient Name: Margaret [name] MRN: [id number] Epilepsy Attending: Charlsie Quest Referring Physician/Provider: Gordy Councilman, MD Date: 07/02/2023 Duration: 23.28 mins Patient history: 62 yo F with ams getting eeg to evaluate for seizure Level of alertness: Awake AEDs during EEG study: None Technical aspects: This EEG study was done with scalp electrodes positioned according to the 10-20 International system of electrode placement. Electrical activity was reviewed with band pass filter of 1-70Hz , sensitivity of 7 uV/mm, display speed of 48mm/sec with a 60Hz  notched filter applied as appropriate. EEG data were recorded continuously and digitally stored.  Video monitoring was available and reviewed as appropriate. Description: The posterior dominant rhythm consists of 7 Hz activity of moderate voltage (25-35 uV) seen predominantly in posterior head regions, symmetric and reactive to eye opening and eye closing. EEG showed continuous generalized 5 to 7 Hz theta slowing. Hyperventilation and photic stimulation were not performed.   ABNORMALITY - Continuous slow, generalized - Background slow IMPRESSION: This study is suggestive of mild diffuse encephalopathy. No seizures or epileptiform discharges were seen throughout the recording. Priyanka Annabelle Harman   CT ANGIO HEAD NECK W WO CM  Result Date: 07/02/2023 CLINICAL DATA:  Follow-up examination for stroke, altered mental  status. EXAM: CT ANGIOGRAPHY HEAD AND NECK WITH AND WITHOUT CONTRAST TECHNIQUE: Multidetector CT imaging of the head and neck was performed using the standard protocol during bolus administration of intravenous contrast. Multiplanar CT image reconstructions and MIPs were obtained to evaluate the vascular anatomy. Carotid stenosis measurements (when applicable) are obtained utilizing NASCET criteria, using the distal internal carotid diameter as the denominator. RADIATION DOSE REDUCTION: This exam was performed according to the departmental dose-optimization program which includes automated exposure control, adjustment of the mA and/or kV according to patient size and/or use of iterative reconstruction technique. CONTRAST:  75mL OMNIPAQUE IOHEXOL 350 MG/ML SOLN COMPARISON:  CT from earlier the same day. FINDINGS: CTA NECK FINDINGS Aortic arch: Standard branching. Imaged portion shows no evidence of aneurysm or  dissection. No significant stenosis of the major arch vessel origins. Right carotid system: Right common and internal carotid arteries are patent without dissection. Atheromatous change about the right carotid bulb without hemodynamically significant greater than 50% stenosis. Superimposed small penetrating plaque noted at the posterior aspect of the proximal cervical right ICA (series 6, image 183). Left carotid system: Left common and internal carotid arteries are patent without dissection. Mild atheromatous change about the left carotid bulb without hemodynamically significant stenosis. Vertebral arteries: Both vertebral arteries arise from the subclavian arteries. Left vertebral artery dominant. Vertebral arteries patent without stenosis or dissection. Skeleton: No worrisome osseous lesions. Moderate spondylosis present at C5-6 and C6-7. Other neck: No other acute finding. Upper chest: No other acute finding. Review of the MIP images confirms the above findings CTA HEAD FINDINGS Anterior circulation: Mild  atheromatous change about the carotid siphons without hemodynamically significant stenosis. A1 segments patent bilaterally. Normal anterior communicating artery complex. Anterior cerebral arteries patent without stenosis. No M1 stenosis or occlusion. Distal MCA branches perfused and symmetric. Posterior circulation: Both V4 segments patent without stenosis. Both PICA patent. Basilar patent without stenosis. Superior cerebral arteries patent bilaterally. Left PCA supplied via the basilar. Fetal type origin of the right PCA. Both PCAs patent to their distal aspects without significant stenosis. Venous sinuses: Not well assessed due to timing of the contrast bolus. Anatomic variants: As above. No aneurysm. 8 mm presumed meningioma noted overlying the left frontal convexity (series 6, image 37). No visible associated edema. Review of the MIP images confirms the above findings IMPRESSION: 1. Negative CTA for large vessel occlusion or other emergent finding. 2. Mild atheromatous change about the carotid bifurcations and carotid siphons without hemodynamically significant stenosis. Superimposed small penetrating plaque at the right carotid bulb as above. 3. 8 mm presumed meningioma overlying the left frontal convexity. Electronically Signed   By: Rise Mu M.D.   On: 07/02/2023 03:33   DG Abd Portable 1 View  Result Date: 07/02/2023 CLINICAL DATA:  MRI screening EXAM: PORTABLE ABDOMEN - 1 VIEW COMPARISON:  None Available. FINDINGS: The bowel gas pattern is normal. No radio-opaque calculi or other significant radiographic abnormality are seen. No retained metallic foreign body identified within the visualized abdomen. IMPRESSION: 1. No retained metallic foreign body. Electronically Signed   By: Helyn Numbers M.D.   On: 07/02/2023 02:24   CT Head Wo Contrast  Result Date: 07/02/2023 CLINICAL DATA:  Mental status change EXAM: CT HEAD WITHOUT CONTRAST TECHNIQUE: Contiguous axial images were obtained from  the base of the skull through the vertex without intravenous contrast. RADIATION DOSE REDUCTION: This exam was performed according to the departmental dose-optimization program which includes automated exposure control, adjustment of the mA and/or kV according to patient size and/or use of iterative reconstruction technique. COMPARISON:  Head CT 05/22/2018, head CT 10/23/2013. FINDINGS: Brain: There is a stable hyperdense extra-axial mass in the left frontal region measuring 9 by 6 by 6 mm. There is no acute intracranial hemorrhage, mass effect, midline shift or extra-axial fluid collection. No acute infarct identified. Vascular: No hyperdense vessel or unexpected calcification. Skull: Normal. Negative for fracture or focal lesion. Sinuses/Orbits: No acute finding. Other: None. IMPRESSION: 1. No acute intracranial process. 2. Stable 9 mm extra-axial mass in the left frontal region, likely a meningioma. Electronically Signed   By: Darliss Cheney M.D.   On: 07/02/2023 00:34   DG Chest Port 1 View  Result Date: 07/02/2023 CLINICAL DATA:  Altered mental status. EXAM: PORTABLE CHEST 1 VIEW  COMPARISON:  05/22/2018 FINDINGS: Stable cardiomediastinal silhouette. Aortic atherosclerotic calcification. No focal consolidation, pleural effusion, or pneumothorax. No displaced rib fractures. Surgical clips in the axilla bilaterally. IMPRESSION: No acute cardiopulmonary disease. Electronically Signed   By: Minerva Fester M.D.   On: 07/02/2023 00:13      Subjective:  Reports her pain is improving, tolerating her diet, asking to go home today Discharge Exam: Vitals:   07/08/23 0700 07/08/23 0830  BP:  (!) 155/103  Pulse:    Resp:    Temp: 98.3 F (36.8 C) 98.3 F (36.8 C)  SpO2:     Vitals:   07/08/23 0500 07/08/23 0527 07/08/23 0700 07/08/23 0830  BP:    (!) 155/103  Pulse:      Resp:      Temp:   98.3 F (36.8 C) 98.3 F (36.8 C)  TempSrc:   Oral Oral  SpO2:      Weight: 99.3 kg 99.3 kg    Height:         General: Pt is alert, awake, not in acute distress Cardiovascular: RRR, S1/S2 +, no rubs, no gallops Respiratory: CTA bilaterally, no wheezing, no rhonchi Abdominal: Soft, NT, appropriately tender over incisions,, bowel sounds + Extremities: no edema, no cyanosis    The results of significant diagnostics from this hospitalization (including imaging, microbiology, ancillary and laboratory) are listed below for reference.     Microbiology: Recent Results (from the past 240 hour(s))  SARS Coronavirus 2 by RT PCR (hospital order, performed in [facility] hospital lab) *cepheid single result test* Anterior Nasal Swab     Status: None   Collection Time: 07/01/23 11:30 PM   Specimen: Anterior Nasal Swab  Result Value Ref Range Status   SARS Coronavirus 2 by RT PCR NEGATIVE NEGATIVE Final    Comment: Performed at [facility], [address] [place], [place] [address]  Culture, blood (routine x 2)     Status: None   Collection Time: 07/02/23 12:42 AM   Specimen: BLOOD RIGHT ARM  Result Value Ref Range Status   Specimen Description BLOOD RIGHT ARM  Final   Special Requests   Final    BOTTLES DRAWN AEROBIC AND ANAEROBIC Blood Culture adequate volume   Culture   Final    NO GROWTH 5 DAYS Performed at [facility], [address], [place], [place] [address]    Report Status 07/07/2023 FINAL  Final  Culture, blood (routine x 2)     Status: None   Collection Time: 07/02/23 12:42 AM   Specimen: BLOOD LEFT ARM  Result Value Ref Range Status   Specimen Description BLOOD LEFT ARM  Final   Special Requests   Final    BOTTLES DRAWN AEROBIC AND ANAEROBIC Blood Culture adequate volume   Culture   Final    NO GROWTH 5 DAYS Performed at [facility], [address] [place], [place] [address]    Report Status 07/07/2023 FINAL  Final  CSF culture w Gram Stain     Status: None   Collection Time: 07/02/23  1:33 AM   Specimen: CSF; Cerebrospinal Fluid  Result Value Ref  Range Status   Specimen Description CSF  Final   Special Requests LP  Final   Gram Stain   Final    CYTOSPIN SMEAR WBC PRESENT, PREDOMINANTLY MONONUCLEAR NO ORGANISMS SEEN    Culture   Final    NO GROWTH 3 DAYS Performed at [facility], [address], [place], [place] [address]    Report Status 07/05/2023 FINAL  Final     Labs: BNP (last 3 results) No results for input(s): "BNP" in the last 8760 hours. Basic Metabolic Panel: Recent Labs  Lab 07/02/23 0045 07/02/23 0046 07/03/23 0422 07/03/23 0425 07/04/23 1503 07/05/23 0447 07/06/23 0311 07/07/23 0437 07/08/23 0401  NA  --    < >  --    < > 140 140 140 138 139  K  --    < >  --    < > 4.3 3.1* 3.1* 3.0* 3.5  CL  --    < >  --    < > 110 109 110 106 105  CO2  --    < >  --    < > 19* 21* 23 23 25   GLUCOSE  --    < >  --    < > 92 105* 103* 93 85  BUN  --    < >  --    < > 19 14 15 23 16   CREATININE  --    < >  --    < > 2.00* 1.75* 1.42* 1.54* 0.96  CALCIUM  --    < >  --    < > 9.1 9.2 8.9 9.2 9.4  MG 1.9  --  1.6*  --  1.7  --  1.7  --   --    < > = values in this interval not displayed.   Liver Function Tests: Recent Labs  Lab 07/01/23 2330 07/02/23 0046 07/05/23 0447 07/06/23 0311  AST 25 23 33 65*  ALT 23 22 31  64*  ALKPHOS 77 74 62 60  BILITOT 0.7 0.6 0.6 0.4  PROT 8.4* 7.4 7.5 6.8  ALBUMIN 4.1 3.8 3.5 3.1*   No results for input(s): "LIPASE", "AMYLASE" in the last 168 hours. Recent Labs  Lab 07/02/23 0046  AMMONIA 23   CBC: Recent Labs  Lab 07/01/23 2330 07/02/23 0045 07/02/23 0324 07/04/23 0424 07/05/23 0447 07/06/23 0311 07/07/23 0437 07/08/23 0401  WBC 17.8* 17.1*   < > 14.8* 15.0* 20.5* 15.6* 12.1*  NEUTROABS 14.8* 13.5*  --   --   --   --   --   --   HGB 15.6* 14.2   < > 13.2 14.1 12.9 12.8 13.8  HCT 48.8* 43.7   < > 39.6 42.2 38.6 39.1 41.4  MCV 91.4 90.3   < > 88.4 88.5 88.1 88.3 88.7  PLT 211 218   < > 185 187 179 185 209   < > = values in this interval not displayed.    Cardiac Enzymes: Recent Labs  Lab 07/02/23 0045  CKTOTAL 309*   BNP: Invalid input(s): "POCBNP" CBG: Recent Labs  Lab 07/07/23 1707 07/07/23 2030 07/07/23 2358 07/08/23 0440 07/08/23 0958  GLUCAP 114* 91 95 114* 93   D-Dimer No results for input(s): "DDIMER" in the last 72 hours. Hgb A1c No results for input(s): "HGBA1C" in the last 72 hours. Lipid Profile No results for input(s): "CHOL", "HDL", "LDLCALC", "TRIG", "CHOLHDL", "LDLDIRECT" in the last 72 hours. Thyroid function studies No results for input(s): "TSH", "T4TOTAL", "T3FREE", "THYROIDAB" in the last 72 hours.  Invalid input(s): "FREET3" Anemia work up No results for input(s): "VITAMINB12", "FOLATE", "FERRITIN", "TIBC", "IRON", "RETICCTPCT" in the last 72 hours. Urinalysis    Component Value Date/Time   COLORURINE YELLOW 07/02/2023 0034   APPEARANCEUR CLEAR 07/02/2023 0034   LABSPEC >1.046 (H)  07/02/2023 0034   PHURINE 6.0 07/02/2023 0034   GLUCOSEU NEGATIVE 07/02/2023 0034   GLUCOSEU NEGATIVE 12/11/2022 1103   HGBUR NEGATIVE 07/02/2023 0034   BILIRUBINUR NEGATIVE 07/02/2023 0034   KETONESUR 20 (A) 07/02/2023 0034   PROTEINUR 30 (A) 07/02/2023 0034   UROBILINOGEN 1.0 12/11/2022 1103   NITRITE NEGATIVE 07/02/2023 0034   LEUKOCYTESUR NEGATIVE 07/02/2023 0034   Sepsis Labs Recent Labs  Lab 07/05/23 0447 07/06/23 0311 07/07/23 0437 07/08/23 0401  WBC 15.0* 20.5* 15.6* 12.1*   Microbiology Recent Results (from the past 240 hour(s))  SARS Coronavirus 2 by RT PCR (hospital order, performed in [facility] hospital lab) *cepheid single result test* Anterior Nasal Swab     Status: None   Collection Time: 07/01/23 11:30 PM   Specimen: Anterior Nasal Swab  Result Value Ref Range Status   SARS Coronavirus 2 by RT PCR NEGATIVE NEGATIVE Final    Comment: Performed at [facility], [address], [place], [place] [address]  Culture, blood (routine x 2)     Status: None   Collection Time: 07/02/23  12:42 AM   Specimen: BLOOD RIGHT ARM  Result Value Ref Range Status   Specimen Description BLOOD RIGHT ARM  Final   Special Requests   Final    BOTTLES DRAWN AEROBIC AND ANAEROBIC Blood Culture adequate volume   Culture   Final    NO GROWTH 5 DAYS Performed at [facility], [address], [place], [place] [address]    Report Status 07/07/2023 FINAL  Final  Culture, blood (routine x 2)     Status: None   Collection Time: 07/02/23 12:42 AM   Specimen: BLOOD LEFT ARM  Result Value Ref Range Status   Specimen Description BLOOD LEFT ARM  Final   Special Requests   Final    BOTTLES DRAWN AEROBIC AND ANAEROBIC Blood Culture adequate volume   Culture   Final    NO GROWTH 5 DAYS Performed at [facility], [address] [place], [place] [address]    Report Status 07/07/2023 FINAL  Final  CSF culture w Gram Stain     Status: None   Collection Time: 07/02/23  1:33 AM   Specimen: CSF; Cerebrospinal Fluid  Result Value Ref Range Status   Specimen Description CSF  Final   Special Requests LP  Final   Gram Stain   Final    CYTOSPIN SMEAR WBC PRESENT, PREDOMINANTLY MONONUCLEAR NO ORGANISMS SEEN    Culture   Final    NO GROWTH 3 DAYS Performed at [facility], [address] [place], [place] [address]    Report Status 07/05/2023 FINAL  Final     Time coordinating discharge: Over 30 minutes  SIGNED:   Huey Bienenstock, MD  [facility] 07/08/2023, 10:32 AM Pager   If 7PM-7AM, please contact night-coverage [email or web address] Password [email or web address]

## 2023-07-08 NOTE — Progress Notes (Signed)
Patient ID: Margaret [name], female   DOB: [birth year], 62 y.o.   MRN: [id number] [facility] Surgery Progress Note  3 Days Post-Op  Subjective: CC-  Abdomen sore but pain improving. Asking to go home. Tolerating diet. Did well with therapy yesterday.  Objective: Vital signs in last 24 hours: Temp:  [98 F (36.7 C)-98.3 F (36.8 C)] 98.3 F (36.8 C) (12/02 0830) Pulse Rate:  [74-95] 82 (12/02 0443) Resp:  [18-20] 18 (12/02 0443) BP: (141-152)/(84-111) 144/88 (12/02 0443) SpO2:  [97 %-100 %] 100 % (12/02 0443) Weight:  [99.3 kg] 99.3 kg (12/02 0527) Last BM Date : 07/07/23  Intake/Output from previous day: 12/01 0701 - 12/02 0700 In: 2544.6 [P.O.:1500; I.V.:1044.6] Out: -  Intake/Output this shift: No intake/output data recorded.  PE: Gen:  Alert, NAD, pleasant Abd: soft, ND, appropriately tender over incisions, bowel sounds present, lap incisions cdi with dermabond present  Lab Results:  Recent Labs    07/07/23 0437 07/08/23 0401  WBC 15.6* 12.1*  HGB 12.8 13.8  HCT 39.1 41.4  PLT 185 209   BMET Recent Labs    07/07/23 0437 07/08/23 0401  NA 138 139  K 3.0* 3.5  CL 106 105  CO2 23 25  GLUCOSE 93 85  BUN 23 16  CREATININE 1.54* 0.96  CALCIUM 9.2 9.4   PT/INR No results for input(s): "LABPROT", "INR" in the last 72 hours. CMP     Component Value Date/Time   NA 139 07/08/2023 0401   NA 142 07/24/2017 1407   K 3.5 07/08/2023 0401   K 3.3 (L) 07/24/2017 1407   CL 105 07/08/2023 0401   CO2 25 07/08/2023 0401   CO2 26 07/24/2017 1407   GLUCOSE 85 07/08/2023 0401   GLUCOSE 110 07/24/2017 1407   BUN 16 07/08/2023 0401   BUN 13.4 07/24/2017 1407   CREATININE 0.96 07/08/2023 0401   CREATININE 0.81 03/15/2022 1223   CREATININE 1.0 07/24/2017 1407   CALCIUM 9.4 07/08/2023 0401   CALCIUM 9.6 07/24/2017 1407   PROT 6.8 07/06/2023 0311   PROT 8.5 (H) 07/24/2017 1407   ALBUMIN 3.1 (L) 07/06/2023 0311   ALBUMIN 3.9 07/24/2017 1407   AST 65 (H)  07/06/2023 0311   AST 15 03/15/2022 1223   AST 23 07/24/2017 1407   ALT 64 (H) 07/06/2023 0311   ALT 20 03/15/2022 1223   ALT 28 07/24/2017 1407   ALKPHOS 60 07/06/2023 0311   ALKPHOS 84 07/24/2017 1407   BILITOT 0.4 07/06/2023 0311   BILITOT 0.3 03/15/2022 1223   BILITOT 0.30 07/24/2017 1407   GFRNONAA >60 07/08/2023 0401   GFRNONAA >60 03/15/2022 1223   GFRAA >60 03/10/2020 0919   Lipase  No results found for: "LIPASE"     Studies/Results: No results found.  Anti-infectives: Anti-infectives (From admission, onward)    Start     Dose/Rate Route Frequency Ordered Stop   07/07/23 1000  amoxicillin-clavulanate (AUGMENTIN) 875-125 MG per tablet 1 tablet        1 tablet Oral Every 12 hours 07/07/23 0723     07/06/23 1445  valACYclovir (VALTREX) tablet 1,000 mg        1,000 mg Oral Every 8 hours 07/06/23 1355 07/13/23 0559   07/04/23 1200  acyclovir (ZOVIRAX) 820 mg in dextrose 5 % 250 mL IVPB  Status:  Discontinued        10 mg/kg  82.1 kg (Adjusted) 266.4 mL/hr over 60 Minutes Intravenous Every 12 hours 07/04/23 0758 07/06/23 1355  07/04/23 0945  cefTRIAXone (ROCEPHIN) 2 g in sodium chloride 0.9 % 100 mL IVPB  Status:  Discontinued        2 g 200 mL/hr over 30 Minutes Intravenous Every 24 hours 07/04/23 0857 07/07/23 0723   07/04/23 0945  metroNIDAZOLE (FLAGYL) IVPB 500 mg  Status:  Discontinued        500 mg 100 mL/hr over 60 Minutes Intravenous Every 12 hours 07/04/23 0858 07/07/23 0723   07/02/23 1500  vancomycin (VANCOREADY) IVPB 500 mg/100 mL  Status:  Discontinued       Placed in "Followed by" Linked Group   500 mg 100 mL/hr over 60 Minutes Intravenous  Once 07/02/23 0531 07/02/23 1457   07/02/23 1400  vancomycin (VANCOREADY) IVPB 1500 mg/300 mL  Status:  Discontinued        1,500 mg 150 mL/hr over 120 Minutes Intravenous Every 12 hours 07/02/23 0526 07/02/23 0531   07/02/23 1400  vancomycin (VANCOCIN) IVPB 1000 mg/200 mL premix  Status:  Discontinued        Placed in "Followed by" Linked Group   1,000 mg 200 mL/hr over 60 Minutes Intravenous  Once 07/02/23 0531 07/02/23 1520   07/02/23 1000  acyclovir (ZOVIRAX) 820 mg in dextrose 5 % 250 mL IVPB  Status:  Discontinued        10 mg/kg  82.1 kg (Adjusted) 266.4 mL/hr over 60 Minutes Intravenous Every 8 hours 07/02/23 0259 07/04/23 0758   07/02/23 0130  acyclovir (ZOVIRAX) 1,075 mg in dextrose 5 % 250 mL IVPB  Status:  Discontinued        10 mg/kg  107.5 kg 271.5 mL/hr over 60 Minutes Intravenous Every 8 hours 07/02/23 0123 07/02/23 0259   07/02/23 0115  vancomycin (VANCOCIN) IVPB 1000 mg/200 mL premix       Placed in "Followed by" Linked Group   1,000 mg 200 mL/hr over 60 Minutes Intravenous  Once 07/02/23 0102 07/02/23 0433   07/02/23 0115  vancomycin (VANCOCIN) IVPB 1000 mg/200 mL premix       Placed in "Followed by" Linked Group   1,000 mg 200 mL/hr over 60 Minutes Intravenous  Once 07/02/23 0102 07/02/23 0354   07/02/23 0045  vancomycin (VANCOCIN) IVPB 1000 mg/200 mL premix  Status:  Discontinued        1,000 mg 200 mL/hr over 60 Minutes Intravenous  Once 07/02/23 0044 07/02/23 0102   07/02/23 0045  cefTRIAXone (ROCEPHIN) 2 g in sodium chloride 0.9 % 100 mL IVPB  Status:  Discontinued        2 g 200 mL/hr over 30 Minutes Intravenous Every 12 hours 07/02/23 0044 07/02/23 1457   07/02/23 0045  ampicillin (OMNIPEN) 2 g in sodium chloride 0.9 % 100 mL IVPB  Status:  Discontinued        2 g 300 mL/hr over 20 Minutes Intravenous Every 4 hours 07/02/23 0044 07/02/23 1457        Assessment/Plan Cholecystitis POD#3 s/p laparoscopic cholecystectomy 11/29 Dr. Sheliah Hatch [facility] for discharge from surgical standpoint. Discharge instructions and follow up info on AVS. She takes tramadol at baseline and filled rx 11/27 so I will not send any more pain medication to her pharmacy. She does not need any more antibiotics from our standpoint.   ID - augmentin, valacyclovir FEN - CM diet VTE -  lovenox Foley - none    LOS: 6 days    Franne Forts, [facility] Surgery 07/08/2023, 8:47 AM Please see Amion for pager number  during day hours 7:00am-4:30pm

## 2023-08-02 ENCOUNTER — Other Ambulatory Visit (HOSPITAL_COMMUNITY): Payer: Self-pay

## 2023-08-15 ENCOUNTER — Other Ambulatory Visit: Payer: Self-pay

## 2023-08-15 ENCOUNTER — Other Ambulatory Visit (HOSPITAL_BASED_OUTPATIENT_CLINIC_OR_DEPARTMENT_OTHER): Payer: Self-pay

## 2023-08-15 ENCOUNTER — Other Ambulatory Visit: Payer: Self-pay | Admitting: Internal Medicine

## 2023-08-15 MED ORDER — TRAMADOL HCL 50 MG PO TABS
50.0000 mg | ORAL_TABLET | Freq: Every day | ORAL | 0 refills | Status: DC | PRN
  Filled 2023-08-15: qty 30, 30d supply, fill #0

## 2023-08-16 ENCOUNTER — Other Ambulatory Visit (HOSPITAL_COMMUNITY): Payer: Self-pay

## 2023-09-03 ENCOUNTER — Ambulatory Visit: Payer: 59 | Admitting: Internal Medicine

## 2023-09-03 ENCOUNTER — Encounter: Payer: Self-pay | Admitting: Internal Medicine

## 2023-09-03 VITALS — BP 124/88 | HR 88 | Temp 98.8°F | Ht 68.5 in | Wt 209.0 lb

## 2023-09-03 DIAGNOSIS — R55 Syncope and collapse: Secondary | ICD-10-CM | POA: Insufficient documentation

## 2023-09-03 DIAGNOSIS — R051 Acute cough: Secondary | ICD-10-CM

## 2023-09-03 DIAGNOSIS — R059 Cough, unspecified: Secondary | ICD-10-CM | POA: Insufficient documentation

## 2023-09-03 DIAGNOSIS — R1310 Dysphagia, unspecified: Secondary | ICD-10-CM | POA: Insufficient documentation

## 2023-09-03 DIAGNOSIS — G9340 Encephalopathy, unspecified: Secondary | ICD-10-CM

## 2023-09-03 DIAGNOSIS — R531 Weakness: Secondary | ICD-10-CM | POA: Insufficient documentation

## 2023-09-03 NOTE — Progress Notes (Signed)
Subjective:   Patient ID: Margaret [name], female    DOB: 17-Dec-1960, 63 y.o.   MRN: [id number]  HPI The patient is a 63 YO female coming in for several concerning things lately. She was discharged in Dec 2024 after a meningitis episode and had cholecystectomy. She says she was feeling a little better on discharge but at some point after maybe around Christmas was feeling worse. For some reason she stopped being able to eat much. She is having a hard time describing to me why. She feels like food is getting stuck and will not swallow. She is unsure when this started. She tried to go back to work and passed out at work. She did not seek care and passed out again also last week. She last ate a bite of baked potato last night but drinking some fluids. She was standing each time when she passed out. She feels as though words will not come out right again she is unsure of when this started. Since last week she is feeling very swimmy headed even while sitting. Her husband was concerned about wanted to call EMS last week but they did not end up going to hospital. She is having some headaches maybe. She also in the last 2 weeks has some cough and cold symptoms with SOB and congestion. This is not improving and using otc medications. She is down 40 pounds since our last weight and she thinks mostly this is since discharge.   PMH, [facility], social history reviewed and updated  Review of Systems  Constitutional:  Positive for activity change, appetite change and unexpected weight change.  HENT:  Positive for congestion and postnasal drip.   Eyes: Negative.   Respiratory:  Positive for cough, shortness of breath and wheezing. Negative for chest tightness.   Cardiovascular:  Negative for chest pain, palpitations and leg swelling.  Gastrointestinal:  Positive for nausea. Negative for abdominal distention, abdominal pain, constipation, diarrhea and vomiting.  Musculoskeletal: Negative.   Skin: Negative.    Neurological:  Positive for dizziness, syncope, speech difficulty, weakness, light-headedness and headaches.  Psychiatric/Behavioral:  Positive for decreased concentration.     Objective:  Physical Exam Constitutional:      Appearance: She is well-developed. She is ill-appearing.  HENT:     Head: Normocephalic and atraumatic.  Cardiovascular:     Rate and Rhythm: Normal rate and regular rhythm.  Pulmonary:     Effort: Pulmonary effort is normal. No respiratory distress.     Breath sounds: Wheezing present. No rales.  Abdominal:     General: Bowel sounds are normal. There is no distension.     Palpations: Abdomen is soft.     Tenderness: There is no abdominal tenderness. There is no rebound.  Musculoskeletal:     Cervical back: Normal range of motion.  Skin:    General: Skin is warm and dry.  Neurological:     Mental Status: She is alert.     Coordination: Coordination normal.     Comments: Oriented times 2 (place and person, unclear of date/day of week) her speech is slow compared to normal and able to follow a conversation but very hard for her to assess timeline of symptoms and events. LUE and LLE weak 3/5 compared to 4/5 RUE and RLE. Speech is not broken.      Vitals:   09/03/23 1001  BP: 124/88  Pulse: 88  Temp: 98.8 F (37.1 C)  TempSrc: Oral  SpO2: 98%  Weight: 209 lb (  94.8 kg)  Height: 5' 8.5" (1.74 m)    Assessment & Plan:  Visit time 20 minutes in face to face communication with patient and coordination of care, additional 15 minutes spent in record review, coordination or care, ordering tests, communicating/referring to other healthcare professionals, documenting in medical records all on the same day of the visit for total time 35 minutes spent on the visit.

## 2023-09-03 NOTE — Assessment & Plan Note (Signed)
New left sided weakness and she is unable to clearly articulate a timeline. She is also having new speech changes with slowed speech. She needs assessment for stroke advised to go to ER and will go with husband now. I am also concerned about recurrence of meningitis she is not at her mental status baseline during exam.

## 2023-09-03 NOTE — Assessment & Plan Note (Signed)
She is down 40 pounds since our last visit with her and describes problem with swallowing. She also has new left sided weakness on exam. She is sent to ER for assessment and likely will need swallow assessment.

## 2023-09-03 NOTE — Patient Instructions (Signed)
We are asking you to go to the ER now for new left sided weakness and speech changes as well as being unable to eat and losing 40 pounds in the last few months. I am concerned about stroke as well as recently you passing out twice.

## 2023-09-03 NOTE — Assessment & Plan Note (Signed)
There is some wheezing on exam and given other findings should have chest x-ray and likely has CAP. We are sending to ER for neurological changes so they will do CXR there.

## 2023-09-03 NOTE — Assessment & Plan Note (Signed)
She has passed out twice in the last week. I suspect this is due to poor appetite given her reported lack of eating. She will need evaluation in the ER for her neurological symptoms.

## 2023-09-03 NOTE — Assessment & Plan Note (Signed)
She is not clearly encephalopathic but her cognition is not at her normal and speech is slow with some confusion about timeline. Concerning for recurrence of meningitis and it is unclear if this has continued since episode. She describes that this was improving and then worsened at some point in the last few weeks.

## 2023-09-04 ENCOUNTER — Telehealth: Payer: Self-pay

## 2023-09-04 DIAGNOSIS — E119 Type 2 diabetes mellitus without complications: Secondary | ICD-10-CM

## 2023-09-04 DIAGNOSIS — E1169 Type 2 diabetes mellitus with other specified complication: Secondary | ICD-10-CM

## 2023-09-04 DIAGNOSIS — R531 Weakness: Secondary | ICD-10-CM

## 2023-09-05 ENCOUNTER — Other Ambulatory Visit: Payer: 59

## 2023-09-05 NOTE — Telephone Encounter (Signed)
I have placed orders for labs and CT scan of head so she should come back for labs and do the head scan they will call to schedule.

## 2023-09-09 ENCOUNTER — Other Ambulatory Visit (HOSPITAL_COMMUNITY): Payer: Self-pay

## 2023-09-10 ENCOUNTER — Other Ambulatory Visit: Payer: Self-pay | Admitting: Internal Medicine

## 2023-09-10 ENCOUNTER — Other Ambulatory Visit (HOSPITAL_COMMUNITY): Payer: Self-pay

## 2023-09-10 DIAGNOSIS — C50412 Malignant neoplasm of upper-outer quadrant of left female breast: Secondary | ICD-10-CM

## 2023-09-10 DIAGNOSIS — C50211 Malignant neoplasm of upper-inner quadrant of right female breast: Secondary | ICD-10-CM

## 2023-09-10 MED ORDER — OZEMPIC (2 MG/DOSE) 8 MG/3ML ~~LOC~~ SOPN
2.0000 mg | PEN_INJECTOR | SUBCUTANEOUS | 1 refills | Status: DC
  Filled 2023-09-10: qty 3, 28d supply, fill #0
  Filled 2023-10-08 – 2023-10-14 (×3): qty 3, 28d supply, fill #1

## 2023-09-10 MED ORDER — POTASSIUM CHLORIDE CRYS ER 20 MEQ PO TBCR
20.0000 meq | EXTENDED_RELEASE_TABLET | Freq: Two times a day (BID) | ORAL | 0 refills | Status: DC
  Filled 2023-09-10: qty 90, 45d supply, fill #0

## 2023-09-10 MED ORDER — VENLAFAXINE HCL ER 150 MG PO CP24
150.0000 mg | ORAL_CAPSULE | Freq: Every day | ORAL | 0 refills | Status: DC
  Filled 2023-09-10: qty 90, 90d supply, fill #0

## 2023-09-12 ENCOUNTER — Ambulatory Visit
Admission: RE | Admit: 2023-09-12 | Discharge: 2023-09-12 | Disposition: A | Discharge status: Home / Self Care | Payer: Medicare Other | Source: Ambulatory Visit | Attending: Internal Medicine | Admitting: Internal Medicine

## 2023-09-12 DIAGNOSIS — R531 Weakness: Secondary | ICD-10-CM

## 2023-09-12 DIAGNOSIS — Z8661 Personal history of infections of the central nervous system: Secondary | ICD-10-CM | POA: Diagnosis not present

## 2023-09-12 DIAGNOSIS — R55 Syncope and collapse: Secondary | ICD-10-CM | POA: Diagnosis not present

## 2023-09-12 DIAGNOSIS — R519 Headache, unspecified: Secondary | ICD-10-CM | POA: Diagnosis not present

## 2023-09-12 DIAGNOSIS — R42 Dizziness and giddiness: Secondary | ICD-10-CM | POA: Diagnosis not present

## 2023-09-13 ENCOUNTER — Encounter: Payer: Self-pay | Admitting: Internal Medicine

## 2023-09-13 ENCOUNTER — Other Ambulatory Visit: Payer: Self-pay

## 2023-09-17 ENCOUNTER — Other Ambulatory Visit: Payer: Self-pay

## 2023-09-18 ENCOUNTER — Other Ambulatory Visit (HOSPITAL_COMMUNITY): Payer: Self-pay

## 2023-09-18 ENCOUNTER — Other Ambulatory Visit: Payer: Self-pay | Admitting: Internal Medicine

## 2023-09-19 ENCOUNTER — Other Ambulatory Visit (HOSPITAL_COMMUNITY): Payer: Self-pay

## 2023-09-19 ENCOUNTER — Other Ambulatory Visit: Payer: Self-pay

## 2023-09-19 MED ORDER — TRAMADOL HCL 50 MG PO TABS
50.0000 mg | ORAL_TABLET | Freq: Every day | ORAL | 1 refills | Status: DC | PRN
  Filled 2023-09-19: qty 30, 30d supply, fill #0
  Filled 2023-11-12: qty 30, 30d supply, fill #1

## 2023-09-24 ENCOUNTER — Ambulatory Visit (INDEPENDENT_AMBULATORY_CARE_PROVIDER_SITE_OTHER): Payer: Medicare Other

## 2023-09-24 ENCOUNTER — Telehealth: Payer: Self-pay

## 2023-09-24 VITALS — Ht 68.5 in | Wt 220.0 lb

## 2023-09-24 DIAGNOSIS — Z0000 Encounter for general adult medical examination without abnormal findings: Secondary | ICD-10-CM

## 2023-09-24 NOTE — Progress Notes (Signed)
Subjective:   Margaret [name] is a 63 y.o. female who presents for Medicare Annual (Subsequent) preventive examination.  Visit Complete: Virtual I connected with  Margaret [name] on 09/24/23 by a video and audio enabled telemedicine application and verified that I am speaking with the correct person using two identifiers.  Patient Location: Home  Provider Location: Home Office  I discussed the limitations of evaluation and management by telemedicine. The patient expressed understanding and agreed to proceed.  Vital Signs: Because this visit was a virtual/telehealth visit, some criteria may be missing or patient reported. Any vitals not documented were not able to be obtained and vitals that have been documented are patient reported.   Cardiac Risk Factors include: advanced age (>81men, >80 women);hypertension;diabetes mellitus;dyslipidemia    Objective:    Today's Vitals   09/24/23 1547  Weight: 220 lb (99.8 kg)  Height: 5' 8.5" (1.74 m)   Body mass index is 32.96 kg/m.     09/24/2023    4:04 PM 07/08/2023    9:44 AM 07/05/2023    2:14 PM 02/05/2023   12:36 PM 09/19/2022    3:57 PM 05/22/2018   10:25 AM 12/20/2016   11:31 AM  Advanced Directives  Does Patient Have a Medical Advance Directive? Yes No;Yes Yes Yes Yes Yes Yes  Type of Estate agent of [place];Living will  Healthcare Power of [place];Living will Healthcare Power of [employer] of [place];Living will Healthcare Power of [place];Living will Healthcare Power of [place];Living will  Does patient want to make changes to medical advance directive?  No - Patient declined No - Patient declined No - Patient declined     Copy of Healthcare Power of Attorney in Chart? No - copy requested  Yes - validated most recent copy scanned in chart (See row information) No - copy requested No - copy requested No - copy requested   Would patient like information on creating a medical advance  directive?  No - Patient declined    No - Patient declined     Current Medications (verified) Outpatient Encounter Medications as of 09/24/2023  Medication Sig   atorvastatin (LIPITOR) 20 MG tablet Take 1 tablet (20 mg total) by mouth daily. Annual appt due in August must see provider for future refills   baclofen (LIORESAL) 20 MG tablet Take 1 tablet (20 mg total) by mouth 3 (three) times daily.   Blood Glucose Monitoring Suppl (BLOOD GLUCOSE MONITOR SYSTEM) w/Device KIT Use to test blood sugar 3 times a day   busPIRone (BUSPAR) 5 MG tablet Take 1 tablet (5 mg total) by mouth daily as needed.   clobetasol cream (TEMOVATE) 0.05 % Apply 1 Application topically 2 (two) times daily.   gabapentin (NEURONTIN) 300 MG capsule Take 2 capsules (600 mg total) by mouth 3 (three) times daily.   glimepiride (AMARYL) 4 MG tablet Take 1 tablet (4 mg total) by mouth daily before breakfast.   glucose blood test strip Use to test blood sugar 2 times a day   Lancets (ONETOUCH DELICA PLUS LANCET33G) MISC Use to check blood sugars twice a day   meloxicam (MOBIC) 15 MG tablet Take 1 tablet (15 mg total) by mouth daily.   Olmesartan-amLODIPine-HCTZ 20-5-12.5 MG TABS Take 1 tablet by mouth daily.   pantoprazole (PROTONIX) 40 MG tablet TAKE 1 TABLET BY MOUTH ONCE DAILY   potassium chloride SA (KLOR-CON M) 20 MEQ tablet Take 1 tablet (20 mEq total) by mouth 2 (two) times daily.  Semaglutide, 2 MG/DOSE, (OZEMPIC, 2 MG/DOSE,) 8 MG/3ML SOPN Inject 2 mg as directed once a week.   traMADol (ULTRAM) 50 MG tablet Take 1 tablet (50 mg total) by mouth daily as needed.   traZODone (DESYREL) 100 MG tablet Take 1 tablet (100 mg total) by mouth at bedtime.   venlafaxine XR (EFFEXOR-XR) 150 MG 24 hr capsule Take 1 capsule (150 mg total) by mouth daily with breakfast.   amoxicillin-clavulanate (AUGMENTIN) 875-125 MG tablet Take 1 tablet by mouth every 12 (twelve) hours. (Patient not taking: Reported on 09/24/2023)   Semaglutide, 1  MG/DOSE, 4 MG/3ML SOPN Inject 1 mg as directed once a week. (Patient not taking: Reported on 07/02/2023)   [DISCONTINUED] losartan-hydrochlorothiazide (HYZAAR) 100-25 MG tablet TAKE 1 TABLET BY MOUTH ONCE DAILY   No facility-administered encounter medications on file as of 09/24/2023.    Allergies (verified) Banana, Latex, Peanuts [peanut oil], and Wheat   History: Past Medical History:  Diagnosis Date   Allergy    Anxiety    Arthritis    Bilateral breast cancer (HCC)    Breast cancer (HCC)    Breast cancer of upper-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) 04/21/2015   Depression    Diabetes mellitus without complication (HCC)    GERD (gastroesophageal reflux disease)    Hypertension    Past Surgical History:  Procedure Laterality Date   ABDOMINAL HYSTERECTOMY  1990   CHOLECYSTECTOMY N/A 07/05/2023   Procedure: LAPAROSCOPIC CHOLECYSTECTOMY;  Surgeon: Kinsinger, De Blanch, MD;  Location: [facility] OR;  Service: General;  Laterality: N/A;   MASTECTOMY MODIFIED RADICAL Left 05/09/2015   MASTECTOMY MODIFIED RADICAL Left 05/09/2015   Procedure: LEFT MODIFIED RADICAL MASTETCTOMY;  Surgeon: Chevis Pretty III, MD;  Location: [facility] OR;  Service: General;  Laterality: Left;   MASTECTOMY W/ SENTINEL NODE BIOPSY Right    MASTECTOMY W/ SENTINEL NODE BIOPSY Right 05/09/2015   Procedure: RIGHT MASTECTOMY WITH RIGHT AXILLARY SENTINEL LYMPH NODE BIOPSY;  Surgeon: Chevis Pretty III, MD;  Location: [facility] OR;  Service: General;  Laterality: Right;   PORTACATH PLACEMENT Right 09/01/2015   Procedure: INSERTION PORT-A-CATH;  Surgeon: Chevis Pretty III, MD;  Location: [facility];  Service: General;  Laterality: Right;   TUBAL LIGATION  1984   Family History  Problem Relation Age of Onset   Hypertension Mother    Diabetes Mother    Colon cancer Neg Hx    Esophageal cancer Neg Hx    Rectal cancer Neg Hx    Stomach cancer Neg Hx    Social History   Socioeconomic History   Marital status: Significant Other     Spouse name: Margaret [name]   Number of children: 4   Years of education: Not on file   Highest education level: Not on file  Occupational History   Occupation: Part time    Employer: [employer]  Tobacco Use   Smoking status: Former    Current packs/day: 0.00    Average packs/day: 0.1 packs/day for 28.0 years (2.8 ttl pk-yrs)    Types: Cigarettes    Start date: 09/02/1990    Quit date: 09/02/2018    Years since quitting: 5.0   Smokeless tobacco: Never  Vaping Use   Vaping status: Never Used  Substance and Sexual Activity   Alcohol use: Not Currently    Comment: Socially   Drug use: No   Sexual activity: Yes    Birth control/protection: Surgical  Other Topics Concern   Not on file  Social History  Narrative   Lives with Margaret [name]   Social Drivers of Health   Financial Resource Strain: Low Risk  (09/19/2022)   Overall Financial Resource Strain (CARDIA)    Difficulty of Paying Living Expenses: Not hard at all  Food Insecurity: No Food Insecurity (07/03/2023)   Hunger Vital Sign    Worried About Running Out of Food in the Last Year: Never true    Ran Out of Food in the Last Year: Never true  Transportation Needs: No Transportation Needs (07/03/2023)   PRAPARE - Administrator, Civil Service (Medical): No    Lack of Transportation (Non-Medical): No  Physical Activity: Inactive (09/24/2023)   Exercise Vital Sign    Days of Exercise per Week: 0 days    Minutes of Exercise per Session: 0 min  Stress: No Stress Concern Present (09/24/2023)   [employer] of Occupational Health - Occupational Stress Questionnaire    Feeling of Stress : Not at all  Social Connections: Socially Isolated (09/24/2023)   Social Connection and Isolation Panel [NHANES]    Frequency of Communication with Friends and Family: Once a week    Frequency of Social Gatherings with Friends and Family: Never    Attends Religious Services: Never    Database administrator or Organizations: No     Attends Engineer, structural: Never    Marital Status: Living with partner    Tobacco Counseling Counseling given: Not Answered   Clinical Intake:  Pre-visit preparation completed: Yes  Pain : No/denies pain     BMI - recorded: 32.96 Nutritional Status: BMI > 30  Obese Nutritional Risks: None Diabetes: Yes CBG done?: No Did pt. bring in CBG monitor from home?: No  How often do you need to have someone help you when you read instructions, pamphlets, or other written materials from your doctor or pharmacy?: 1 - Never  Interpreter Needed?: No  Information entered by :: Lavalle Skoda, RMA   Activities of Daily Living    09/24/2023    3:58 PM 07/08/2023    9:44 AM  In your present state of health, do you have any difficulty performing the following activities:  Hearing? 0   Vision? 0   Difficulty concentrating or making decisions? 0   Walking or climbing stairs? 0   Dressing or bathing? 0   Doing errands, shopping? 0 0  Comment husband drives   Preparing Food and eating ? N   Using the Toilet? N   In the past six months, have you accidently leaked urine? Y   Do you have problems with loss of bowel control? N   Comment certain things she eats   Managing your Medications? N   Managing your Finances? N   Housekeeping or managing your Housekeeping? N     Patient Care Team: [name], MD as PCP - General (Internal Medicine) Griselda Miner, MD as Consulting Physician (General Surgery) Antony Blackbird, MD as Consulting Physician (Radiation Oncology) Pershing Proud, RN as Registered Nurse Donnelly Angelica, RN as Registered Nurse Myra Rude, MD as Consulting Physician (Family Medicine) Kathyrn Sheriff, [facility] (Inactive) as Pharmacist (Pharmacist) Rachel Moulds, MD as Consulting Physician (Hematology and Oncology)  Indicate any recent Medical Services you may have received from other than [facility] providers in the past year (date may be  approximate).     Assessment:   This is a routine wellness examination for Dietrich.  Hearing/Vision screen Hearing Screening - Comments:: Denies hearing difficulties  Goals Addressed   None   Depression Screen    09/24/2023    4:12 PM 12/11/2022   10:34 AM 09/19/2022    3:58 PM 12/13/2020    9:57 AM 08/16/2020   11:17 AM 03/28/2017   11:01 AM  PHQ 2/9 Scores  PHQ - 2 Score 0 0 1 0 0 2  PHQ- 9 Score  0   4     Fall Risk    09/24/2023    4:05 PM 12/11/2022   10:34 AM 09/19/2022    4:00 PM 12/13/2020    9:57 AM 03/28/2017   11:01 AM  Fall Risk   Falls in the past year? 0 0 0 0 No  Number falls in past yr: 0 0 0 0   Injury with Fall? 0 0 0 0   Risk for fall due to : No Fall Risks  Impaired balance/gait No Fall Risks   Follow up Falls prevention discussed;Falls evaluation completed Falls evaluation completed Falls prevention discussed Falls evaluation completed     MEDICARE RISK AT HOME: Medicare Risk at Home Any stairs in or around the home?: Yes If so, are there any without handrails?: Yes Home free of loose throw rugs in walkways, pet beds, electrical cords, etc?: Yes Adequate lighting in your home to reduce risk of falls?: Yes Life alert?: No Use of a cane, walker or w/c?: No Grab bars in the bathroom?: Yes Shower chair or bench in shower?: Yes (would like to look into getting) Elevated toilet seat or a handicapped toilet?: Yes  TIMED UP AND GO:  Was the test performed?  No    Cognitive Function:        09/19/2022    4:02 PM  6CIT Screen  What Year? 0 points  What month? 0 points  What time? 0 points  Count back from 20 0 points  Months in reverse 0 points  Repeat phrase 0 points  Total Score 0 points    Immunizations Immunization History  Administered Date(s) Administered   Influenza,inj,Quad PF,6+ Mos 09/15/2015, 05/18/2016, 05/22/2018, 05/13/2019, 05/14/2019, 08/16/2020   Influenza-Unspecified 06/06/2021, 05/17/2022, 06/04/2023   Moderna Covid-19  Fall Seasonal Vaccine 31yrs & older 06/05/2022   Moderna Covid-19 Vaccine Bivalent Booster 76yrs & up 06/06/2021   PFIZER(Purple Top)SARS-COV-2 Vaccination 10/29/2019, 11/19/2019, 07/07/2020   Pneumococcal Polysaccharide-23 12/13/2020   RSV,unspecified 05/17/2022   Tdap 05/07/2015   Zoster, Unspecified 06/04/2023    TDAP status: Up to date  Flu Vaccine status: Up to date  Pneumococcal vaccine status: Due, Education has been provided regarding the importance of this vaccine. Advised may receive this vaccine at local pharmacy or Health Dept. Aware to provide a copy of the vaccination record if obtained from local pharmacy or Health Dept. Verbalized acceptance and understanding.  Covid-19 vaccine status: Completed vaccines  Qualifies for Shingles Vaccine? Yes   Zostavax completed Yes   Shingrix Completed?: No.    Education has been provided regarding the importance of this vaccine. Patient has been advised to call insurance company to determine out of pocket expense if they have not yet received this vaccine. Advised may also receive vaccine at local pharmacy or Health Dept. Verbalized acceptance and understanding.  Screening Tests Health Maintenance  Topic Date Due   Hepatitis C Screening  Never done   Zoster Vaccines- Shingrix (1 of 2) 05/05/1980   OPHTHALMOLOGY EXAM  02/01/2021   Pneumococcal Vaccine 40-25 Years old (2 of 2 - PCV) 12/13/2021   COVID-19 Vaccine (6 - 2024-25 season)  04/07/2023   HEMOGLOBIN A1C  09/26/2023   Diabetic kidney evaluation - Urine ACR  12/11/2023   FOOT EXAM  12/11/2023   Diabetic kidney evaluation - eGFR measurement  07/07/2024   Medicare Annual Wellness (AWV)  09/23/2024   DTaP/Tdap/Td (2 - Td or Tdap) 05/06/2025   Colonoscopy  09/16/2028   INFLUENZA VACCINE  Completed   HIV Screening  Completed   HPV VACCINES  Aged Out    Health Maintenance  Health Maintenance Due  Topic Date Due   Hepatitis C Screening  Never done   Zoster Vaccines- Shingrix  (1 of 2) 05/05/1980   OPHTHALMOLOGY EXAM  02/01/2021   Pneumococcal Vaccine 66-42 Years old (2 of 2 - PCV) 12/13/2021   COVID-19 Vaccine (6 - 2024-25 season) 04/07/2023    Colorectal cancer screening: Type of screening: Colonoscopy. Completed 09/16/2018. Repeat every 10 years  Mammogram status: No longer required due to Mastectomy.   Lung Cancer Screening: (Low Dose CT Chest recommended if Age 103-80 years, 20 pack-year currently smoking OR have quit w/in 15years.) does not qualify.   Lung Cancer Screening Referral:   Additional Screening:  Hepatitis C Screening: does qualify;   Vision Screening: Recommended annual ophthalmology exams for early detection of glaucoma and other disorders of the eye. Is the patient up to date with their annual eye exam?  No  Who is the provider or what is the name of the office in which the patient attends annual eye exams? Need Referral If pt is not established with a provider, would they like to be referred to a provider to establish care? Yes .   Dental Screening: Recommended annual dental exams for proper oral hygiene  Diabetic Foot Exam: Diabetic Foot Exam: Completed 12/11/2022  Community Resource Referral / Chronic Care Management: CRR required this visit?  Yes   CCM required this visit?  No     Plan:     I have personally reviewed and noted the following in the patient's chart:   Medical and social history Use of alcohol, tobacco or illicit drugs  Current medications and supplements including opioid prescriptions. Patient is currently taking opioid prescriptions. Information provided to patient regarding non-opioid alternatives. Patient advised to discuss non-opioid treatment plan with their provider. Functional ability and status Nutritional status Physical activity Advanced directives List of other physicians Hospitalizations, surgeries, and ER visits in previous 12 months Vitals Screenings to include cognitive, depression, and  falls Referrals and appointments  In addition, I have reviewed and discussed with patient certain preventive protocols, quality metrics, and best practice recommendations. A written personalized care plan for preventive services as well as general preventive health recommendations were provided to patient.     Alesana Magistro L Ailey Wessling, CMA   09/24/2023   After Visit Summary: (MyChart) Due to this being a telephonic visit, the after visit summary with patients personalized plan was offered to patient via MyChart   Nurse Notes: Patient is due for a second Shingrix vaccine, and a second pneumococcal vaccine.  She is also due for a diabetic eye exam, which she would like a referral for an ophthalmology clinic. I sent a TE to PCP in regards to patient requesting a shower chair and help to buy depends, as there has been a change in her insurance to items not being paid for due to her conditions.

## 2023-09-24 NOTE — Telephone Encounter (Signed)
Due for follow up please get her scheduled. If she has medicaid they will pay for incontinence supplies I do not believe medicare does. Shower chair will likely require documentation to order can do at visit. Thanks

## 2023-09-24 NOTE — Patient Instructions (Signed)
Ms. Dirden , Thank you for taking time to come for your Medicare Wellness Visit. I appreciate your ongoing commitment to your health goals. Please review the following plan we discussed and let me know if I can assist you in the future.   Referrals/Orders/Follow-Ups/Clinician Recommendations: It was nice talking to you today.  You are due for a 2nd Shingles vaccine and a 2nd pneumonia vaccine.  Remember to call [facility] to see if they will accept your current insurance.  Also remember to call office to schedule your follow up appointment by June 2025.  Aim for 30 minutes of exercise or brisk walking, 6-8 glasses of water, and 5 servings of fruits and vegetables each day.    This is a list of the screening recommended for you and due dates:  Health Maintenance  Topic Date Due   Hepatitis C Screening  Never done   Zoster (Shingles) Vaccine (1 of 2) 05/05/1980   Eye exam for diabetics  02/01/2021   Pneumococcal Vaccination (2 of 2 - PCV) 12/13/2021   COVID-19 Vaccine (7 - 2024-25 season) 07/30/2023   Hemoglobin A1C  09/26/2023   Yearly kidney health urinalysis for diabetes  12/11/2023   Complete foot exam   12/11/2023   Yearly kidney function blood test for diabetes  07/07/2024   Medicare Annual Wellness Visit  09/23/2024   DTaP/Tdap/Td vaccine (2 - Td or Tdap) 05/06/2025   Colon Cancer Screening  09/16/2028   Flu Shot  Completed   HIV Screening  Completed   HPV Vaccine  Aged Out    Advanced directives: (Copy Requested) Please bring a copy of your health care power of attorney and living will to the office to be added to your chart at your convenience.  Next Medicare Annual Wellness Visit scheduled for next year: Yes

## 2023-09-24 NOTE — Telephone Encounter (Signed)
Patient is requesting help to get a shower chair and also some help with purchasing some briefs for her urge incontinence condition.

## 2023-09-26 ENCOUNTER — Telehealth: Payer: Self-pay

## 2023-09-26 NOTE — Progress Notes (Signed)
Complex Care Management Note Care Guide Note  09/26/2023 Name: Margaret [name] MRN: [id number] DOB: [birth year]   Complex Care Management Outreach Attempts: An unsuccessful telephone outreach was attempted today to offer the patient information about available complex care management services.  Follow Up Plan:  Additional outreach attempts will be made to offer the patient complex care management information and services.   Encounter Outcome:  No Answer  Tariq Pernell [facility]  [facility] Guide Direct Dial: [phone]  Fax: [phone] Website: [email or web address]

## 2023-09-30 ENCOUNTER — Telehealth: Payer: Self-pay

## 2023-09-30 NOTE — Progress Notes (Signed)
 Complex Care Management Note Care Guide Note  09/30/2023 Name: Margaret [name] MRN: [id number] DOB: 03/08/1961   Complex Care Management Outreach Attempts: A third unsuccessful outreach was attempted today to offer the patient with information about available complex care management services.  Follow Up Plan:  No further outreach attempts will be made at this time. We have been unable to contact the patient to offer or enroll patient in complex care management services.  Encounter Outcome:  No Answer  Jeanie Mccard [facility]  [facility] Guide Direct Dial: [phone]  Fax: [phone] Website: [email or web address]

## 2023-10-02 ENCOUNTER — Other Ambulatory Visit (HOSPITAL_COMMUNITY): Payer: Self-pay

## 2023-10-08 ENCOUNTER — Other Ambulatory Visit (HOSPITAL_COMMUNITY): Payer: Self-pay

## 2023-10-08 ENCOUNTER — Other Ambulatory Visit: Payer: Self-pay | Admitting: Hematology and Oncology

## 2023-10-08 DIAGNOSIS — Z170 Estrogen receptor positive status [ER+]: Secondary | ICD-10-CM

## 2023-10-08 DIAGNOSIS — G629 Polyneuropathy, unspecified: Secondary | ICD-10-CM

## 2023-10-09 ENCOUNTER — Other Ambulatory Visit (HOSPITAL_COMMUNITY): Payer: Self-pay

## 2023-10-10 ENCOUNTER — Other Ambulatory Visit (HOSPITAL_COMMUNITY): Payer: Self-pay

## 2023-10-10 ENCOUNTER — Other Ambulatory Visit: Payer: Self-pay

## 2023-10-10 MED ORDER — GABAPENTIN 300 MG PO CAPS
600.0000 mg | ORAL_CAPSULE | Freq: Three times a day (TID) | ORAL | 2 refills | Status: DC
  Filled 2023-10-10: qty 180, 30d supply, fill #0
  Filled 2023-11-12: qty 180, 30d supply, fill #1
  Filled 2023-12-18: qty 180, 30d supply, fill #2

## 2023-10-12 ENCOUNTER — Other Ambulatory Visit (HOSPITAL_COMMUNITY): Payer: Self-pay

## 2023-10-14 ENCOUNTER — Other Ambulatory Visit: Payer: Self-pay

## 2023-10-16 ENCOUNTER — Other Ambulatory Visit (HOSPITAL_COMMUNITY): Payer: Self-pay

## 2023-10-24 ENCOUNTER — Other Ambulatory Visit: Payer: Self-pay

## 2023-10-24 ENCOUNTER — Other Ambulatory Visit (HOSPITAL_COMMUNITY): Payer: Self-pay

## 2023-10-24 ENCOUNTER — Encounter: Payer: Self-pay | Admitting: Internal Medicine

## 2023-10-24 ENCOUNTER — Ambulatory Visit: Admitting: Internal Medicine

## 2023-10-24 VITALS — BP 138/80 | HR 63 | Temp 98.5°F | Ht 68.5 in | Wt 217.0 lb

## 2023-10-24 DIAGNOSIS — E785 Hyperlipidemia, unspecified: Secondary | ICD-10-CM | POA: Diagnosis not present

## 2023-10-24 DIAGNOSIS — Z853 Personal history of malignant neoplasm of breast: Secondary | ICD-10-CM | POA: Diagnosis not present

## 2023-10-24 DIAGNOSIS — E119 Type 2 diabetes mellitus without complications: Secondary | ICD-10-CM

## 2023-10-24 DIAGNOSIS — Z7985 Long-term (current) use of injectable non-insulin antidiabetic drugs: Secondary | ICD-10-CM

## 2023-10-24 DIAGNOSIS — M5416 Radiculopathy, lumbar region: Secondary | ICD-10-CM

## 2023-10-24 DIAGNOSIS — R531 Weakness: Secondary | ICD-10-CM | POA: Diagnosis not present

## 2023-10-24 DIAGNOSIS — E1169 Type 2 diabetes mellitus with other specified complication: Secondary | ICD-10-CM

## 2023-10-24 LAB — LIPID PANEL
Cholesterol: 110 mg/dL (ref 0–200)
HDL: 38.2 mg/dL — ABNORMAL LOW (ref >39.00)
LDL Cholesterol: 47 mg/dL (ref 0–99)
NonHDL: 71.52
Total CHOL/HDL Ratio: 3
Triglycerides: 124 mg/dL (ref 0.0–149.0)
VLDL: 24.8 mg/dL (ref 0.0–40.0)

## 2023-10-24 LAB — COMPREHENSIVE METABOLIC PANEL
ALT: 23 U/L (ref 0–35)
AST: 15 U/L (ref 0–37)
Albumin: 4.2 g/dL (ref 3.5–5.2)
Alkaline Phosphatase: 123 U/L — ABNORMAL HIGH (ref 39–117)
BUN: 13 mg/dL (ref 6–23)
CO2: 29 meq/L (ref 19–32)
Calcium: 10.1 mg/dL (ref 8.4–10.5)
Chloride: 103 meq/L (ref 96–112)
Creatinine, Ser: 0.92 mg/dL (ref 0.40–1.20)
GFR: 66.75 mL/min (ref >60.00)
Glucose, Bld: 105 mg/dL — ABNORMAL HIGH (ref 70–99)
Potassium: 3.9 meq/L (ref 3.5–5.1)
Sodium: 140 meq/L (ref 135–145)
Total Bilirubin: 0.3 mg/dL (ref 0.2–1.2)
Total Protein: 8.1 g/dL (ref 6.0–8.3)

## 2023-10-24 LAB — HEMOGLOBIN A1C: Hgb A1c MFr Bld: 5.6 % (ref 4.6–6.5)

## 2023-10-24 LAB — CBC
HCT: 40.6 % (ref 36.0–46.0)
Hemoglobin: 13.5 g/dL (ref 12.0–15.0)
MCHC: 33.2 g/dL (ref 30.0–36.0)
MCV: 95.3 fl (ref 78.0–100.0)
Platelets: 239 10*3/uL (ref 150.0–400.0)
RBC: 4.26 Mil/uL (ref 3.87–5.11)
RDW: 15.9 % — ABNORMAL HIGH (ref 11.5–15.5)
WBC: 9.8 10*3/uL (ref 4.0–10.5)

## 2023-10-24 LAB — VITAMIN B12: Vitamin B-12: 814 pg/mL (ref 211–911)

## 2023-10-24 LAB — MICROALBUMIN / CREATININE URINE RATIO
Creatinine,U: 82.1 mg/dL
Microalb Creat Ratio: UNDETERMINED mg/g (ref 0.0–30.0)
Microalb, Ur: <0.7 mg/dL

## 2023-10-24 LAB — VITAMIN D 25 HYDROXY (VIT D DEFICIENCY, FRACTURES): VITD: 31.73 ng/mL (ref 30.00–100.00)

## 2023-10-24 LAB — TSH: TSH: 0.76 u[IU]/mL (ref 0.35–5.50)

## 2023-10-24 MED ORDER — HYDROCODONE-ACETAMINOPHEN 5-325 MG PO TABS
1.0000 | ORAL_TABLET | Freq: Four times a day (QID) | ORAL | 0 refills | Status: AC | PRN
  Filled 2023-10-24: qty 20, 5d supply, fill #0

## 2023-10-24 NOTE — Patient Instructions (Addendum)
 We need to check the labs today.   We need to get you in with the back specialist to help long term with your back and pain down the leg.  6 tylenol is the safe limit so do not take more than that per day.

## 2023-10-24 NOTE — Progress Notes (Unsigned)
   Subjective:   Patient ID: Margaret [name], female    DOB: 1960/12/04, 63 y.o.   MRN: [id number]  HPI The patient is a 63 YO female coming in for left leg and hip and low back pain. Needs shower chair due to instability.   Review of Systems  Objective:  Physical Exam  Vitals:   10/24/23 0925  BP: 138/80  Pulse: 63  Temp: 98.5 F (36.9 C)  TempSrc: Oral  SpO2: 96%  Weight: 217 lb (98.4 kg)  Height: 5' 8.5" (1.74 m)    Assessment & Plan:

## 2023-10-25 NOTE — Assessment & Plan Note (Signed)
 With chronic pain and takes tramadol 1 pill daily for this pain which is stable and caused by cancer treatment.

## 2023-10-25 NOTE — Assessment & Plan Note (Signed)
 Checking lipid panel as due.

## 2023-10-25 NOTE — Assessment & Plan Note (Signed)
 Needs shower chair as she has almost fallen multiple times in shower and has lumbar radiculopathy causing instability.

## 2023-10-25 NOTE — Assessment & Plan Note (Signed)
 She is asked to follow up with neurosurgeon as she is having pain in the left leg likely from lumbar radiculopathy. Rx hydrocodone small amount but this is not a chronic solution and she is not able to take nsaids. She is taking too much tylenol (likely 15 pills per day) and asked to reduce to safe limit of maximum 3000 mg daily. Her tramadol prescription is for chronic cancer related pain in chest wall and she was reminded of that today. We are unable to prescribe opioids for her chronic back pain.

## 2023-10-25 NOTE — Assessment & Plan Note (Signed)
 Checking HgA1c, CMP, lipid panel and microalbumin creatinine ratio. Adjust treatment as needed. Is on ARB and statin and using ozempic.

## 2023-10-28 ENCOUNTER — Encounter: Payer: Self-pay | Admitting: Internal Medicine

## 2023-10-31 DIAGNOSIS — M5416 Radiculopathy, lumbar region: Secondary | ICD-10-CM | POA: Diagnosis not present

## 2023-11-07 DIAGNOSIS — M5416 Radiculopathy, lumbar region: Secondary | ICD-10-CM | POA: Diagnosis not present

## 2023-11-12 ENCOUNTER — Other Ambulatory Visit: Payer: Self-pay

## 2023-11-12 ENCOUNTER — Other Ambulatory Visit (HOSPITAL_COMMUNITY): Payer: Self-pay

## 2023-11-12 ENCOUNTER — Other Ambulatory Visit: Payer: Self-pay | Admitting: Internal Medicine

## 2023-11-14 ENCOUNTER — Other Ambulatory Visit: Payer: Self-pay

## 2023-11-14 ENCOUNTER — Other Ambulatory Visit (HOSPITAL_COMMUNITY): Payer: Self-pay

## 2023-11-14 MED ORDER — OZEMPIC (2 MG/DOSE) 8 MG/3ML ~~LOC~~ SOPN
2.0000 mg | PEN_INJECTOR | SUBCUTANEOUS | 1 refills | Status: DC
  Filled 2023-11-14: qty 3, 28d supply, fill #0
  Filled 2023-12-05: qty 3, 28d supply, fill #1

## 2023-11-14 MED ORDER — POTASSIUM CHLORIDE CRYS ER 20 MEQ PO TBCR
20.0000 meq | EXTENDED_RELEASE_TABLET | Freq: Two times a day (BID) | ORAL | 0 refills | Status: DC
  Filled 2023-11-14: qty 90, 45d supply, fill #0

## 2023-12-05 ENCOUNTER — Other Ambulatory Visit (HOSPITAL_COMMUNITY): Payer: Self-pay

## 2023-12-05 ENCOUNTER — Other Ambulatory Visit: Payer: Self-pay | Admitting: Internal Medicine

## 2023-12-05 ENCOUNTER — Other Ambulatory Visit: Payer: Self-pay | Admitting: Hematology and Oncology

## 2023-12-05 ENCOUNTER — Other Ambulatory Visit: Payer: Self-pay

## 2023-12-05 DIAGNOSIS — C50211 Malignant neoplasm of upper-inner quadrant of right female breast: Secondary | ICD-10-CM

## 2023-12-05 DIAGNOSIS — C50412 Malignant neoplasm of upper-outer quadrant of left female breast: Secondary | ICD-10-CM

## 2023-12-05 MED ORDER — GLIMEPIRIDE 4 MG PO TABS
4.0000 mg | ORAL_TABLET | Freq: Every day | ORAL | 2 refills | Status: DC
  Filled 2023-12-05: qty 90, 90d supply, fill #0
  Filled 2024-03-04: qty 90, 90d supply, fill #1

## 2023-12-05 MED ORDER — BACLOFEN 20 MG PO TABS
20.0000 mg | ORAL_TABLET | Freq: Three times a day (TID) | ORAL | 5 refills | Status: DC
  Filled 2023-12-05: qty 90, 30d supply, fill #0
  Filled 2024-02-13: qty 90, 30d supply, fill #1
  Filled 2024-03-24: qty 90, 30d supply, fill #2

## 2023-12-05 MED ORDER — VENLAFAXINE HCL ER 150 MG PO CP24
150.0000 mg | ORAL_CAPSULE | Freq: Every day | ORAL | 0 refills | Status: DC
  Filled 2023-12-05: qty 90, 90d supply, fill #0

## 2023-12-11 ENCOUNTER — Other Ambulatory Visit: Payer: Self-pay | Admitting: Internal Medicine

## 2023-12-12 ENCOUNTER — Other Ambulatory Visit (HOSPITAL_COMMUNITY): Payer: Self-pay

## 2023-12-13 ENCOUNTER — Other Ambulatory Visit: Payer: Self-pay

## 2023-12-13 ENCOUNTER — Other Ambulatory Visit (HOSPITAL_COMMUNITY): Payer: Self-pay

## 2023-12-13 MED ORDER — TRAMADOL HCL 50 MG PO TABS
50.0000 mg | ORAL_TABLET | Freq: Every day | ORAL | 1 refills | Status: DC | PRN
  Filled 2023-12-13: qty 30, 30d supply, fill #0
  Filled 2024-02-13: qty 30, 30d supply, fill #1

## 2023-12-14 ENCOUNTER — Other Ambulatory Visit (HOSPITAL_COMMUNITY): Payer: Self-pay

## 2023-12-16 ENCOUNTER — Other Ambulatory Visit (HOSPITAL_COMMUNITY): Payer: Self-pay

## 2023-12-18 ENCOUNTER — Other Ambulatory Visit (HOSPITAL_COMMUNITY): Payer: Self-pay

## 2023-12-18 ENCOUNTER — Other Ambulatory Visit: Payer: Self-pay

## 2023-12-26 ENCOUNTER — Other Ambulatory Visit (HOSPITAL_COMMUNITY): Payer: Self-pay

## 2023-12-26 ENCOUNTER — Other Ambulatory Visit: Payer: Self-pay

## 2023-12-26 ENCOUNTER — Other Ambulatory Visit: Payer: Self-pay | Admitting: Internal Medicine

## 2023-12-26 MED ORDER — VALACYCLOVIR HCL 1 G PO TABS
1000.0000 mg | ORAL_TABLET | Freq: Three times a day (TID) | ORAL | 0 refills | Status: AC
  Filled 2023-12-26: qty 24, 8d supply, fill #0

## 2023-12-26 MED ORDER — OZEMPIC (2 MG/DOSE) 8 MG/3ML ~~LOC~~ SOPN
2.0000 mg | PEN_INJECTOR | SUBCUTANEOUS | 1 refills | Status: DC
  Filled 2023-12-26: qty 3, 28d supply, fill #0
  Filled 2024-02-05: qty 3, 28d supply, fill #1

## 2023-12-26 MED ORDER — POTASSIUM CHLORIDE CRYS ER 20 MEQ PO TBCR
20.0000 meq | EXTENDED_RELEASE_TABLET | Freq: Two times a day (BID) | ORAL | 0 refills | Status: DC
  Filled 2023-12-26 (×2): qty 90, 45d supply, fill #0

## 2023-12-26 MED ORDER — OLMESARTAN-AMLODIPINE-HCTZ 20-5-12.5 MG PO TABS
1.0000 | ORAL_TABLET | Freq: Every day | ORAL | 3 refills | Status: AC
  Filled 2023-12-26: qty 90, 90d supply, fill #0
  Filled 2024-04-07: qty 90, 90d supply, fill #1
  Filled 2024-07-06: qty 90, 90d supply, fill #2

## 2023-12-27 ENCOUNTER — Other Ambulatory Visit: Payer: Self-pay

## 2024-02-05 ENCOUNTER — Other Ambulatory Visit (HOSPITAL_COMMUNITY): Payer: Self-pay

## 2024-02-05 ENCOUNTER — Other Ambulatory Visit: Payer: Self-pay | Admitting: Internal Medicine

## 2024-02-05 ENCOUNTER — Other Ambulatory Visit: Payer: Self-pay | Admitting: Hematology and Oncology

## 2024-02-05 ENCOUNTER — Other Ambulatory Visit: Payer: Self-pay

## 2024-02-05 DIAGNOSIS — G4720 Circadian rhythm sleep disorder, unspecified type: Secondary | ICD-10-CM

## 2024-02-05 DIAGNOSIS — C50011 Malignant neoplasm of nipple and areola, right female breast: Secondary | ICD-10-CM

## 2024-02-05 DIAGNOSIS — G629 Polyneuropathy, unspecified: Secondary | ICD-10-CM

## 2024-02-05 MED ORDER — POTASSIUM CHLORIDE CRYS ER 20 MEQ PO TBCR
20.0000 meq | EXTENDED_RELEASE_TABLET | Freq: Two times a day (BID) | ORAL | 0 refills | Status: DC
  Filled 2024-02-05: qty 90, 45d supply, fill #0

## 2024-02-05 MED ORDER — GABAPENTIN 300 MG PO CAPS
600.0000 mg | ORAL_CAPSULE | Freq: Three times a day (TID) | ORAL | 2 refills | Status: DC
Start: 2024-02-05 — End: 2024-05-05
  Filled 2024-02-05: qty 180, 30d supply, fill #0
  Filled 2024-03-04: qty 180, 30d supply, fill #1
  Filled 2024-04-07: qty 180, 30d supply, fill #2

## 2024-02-11 ENCOUNTER — Other Ambulatory Visit (HOSPITAL_COMMUNITY): Payer: Self-pay

## 2024-02-11 MED ORDER — TRAZODONE HCL 100 MG PO TABS
100.0000 mg | ORAL_TABLET | Freq: Every day | ORAL | 3 refills | Status: AC
  Filled 2024-02-11 – 2024-03-24 (×2): qty 90, 90d supply, fill #0
  Filled 2024-07-06: qty 90, 90d supply, fill #1

## 2024-02-12 ENCOUNTER — Other Ambulatory Visit (HOSPITAL_COMMUNITY): Payer: Self-pay

## 2024-02-13 ENCOUNTER — Other Ambulatory Visit: Payer: Self-pay

## 2024-02-13 ENCOUNTER — Other Ambulatory Visit: Payer: Self-pay | Admitting: Hematology and Oncology

## 2024-02-13 ENCOUNTER — Other Ambulatory Visit (HOSPITAL_COMMUNITY): Payer: Self-pay

## 2024-02-21 ENCOUNTER — Other Ambulatory Visit (HOSPITAL_COMMUNITY): Payer: Self-pay

## 2024-03-04 ENCOUNTER — Other Ambulatory Visit: Payer: Self-pay | Admitting: Internal Medicine

## 2024-03-04 ENCOUNTER — Other Ambulatory Visit (HOSPITAL_COMMUNITY): Payer: Self-pay

## 2024-03-04 DIAGNOSIS — C50412 Malignant neoplasm of upper-outer quadrant of left female breast: Secondary | ICD-10-CM

## 2024-03-04 DIAGNOSIS — C50211 Malignant neoplasm of upper-inner quadrant of right female breast: Secondary | ICD-10-CM

## 2024-03-09 ENCOUNTER — Other Ambulatory Visit (HOSPITAL_COMMUNITY): Payer: Self-pay

## 2024-03-09 ENCOUNTER — Other Ambulatory Visit: Payer: Self-pay

## 2024-03-09 ENCOUNTER — Other Ambulatory Visit: Payer: Self-pay | Admitting: Internal Medicine

## 2024-03-09 DIAGNOSIS — C50211 Malignant neoplasm of upper-inner quadrant of right female breast: Secondary | ICD-10-CM

## 2024-03-09 DIAGNOSIS — C50412 Malignant neoplasm of upper-outer quadrant of left female breast: Secondary | ICD-10-CM

## 2024-03-09 MED ORDER — VENLAFAXINE HCL ER 150 MG PO CP24
150.0000 mg | ORAL_CAPSULE | Freq: Every day | ORAL | 1 refills | Status: DC
  Filled 2024-03-09: qty 90, 90d supply, fill #0

## 2024-03-09 MED ORDER — OZEMPIC (2 MG/DOSE) 8 MG/3ML ~~LOC~~ SOPN
2.0000 mg | PEN_INJECTOR | SUBCUTANEOUS | 1 refills | Status: DC
  Filled 2024-03-09: qty 9, 84d supply, fill #0
  Filled 2024-06-01: qty 9, 84d supply, fill #1

## 2024-03-10 ENCOUNTER — Other Ambulatory Visit: Payer: Self-pay

## 2024-03-24 ENCOUNTER — Other Ambulatory Visit: Payer: Self-pay | Admitting: Internal Medicine

## 2024-03-24 ENCOUNTER — Other Ambulatory Visit: Payer: Self-pay

## 2024-03-24 ENCOUNTER — Other Ambulatory Visit (HOSPITAL_BASED_OUTPATIENT_CLINIC_OR_DEPARTMENT_OTHER): Payer: Self-pay

## 2024-03-24 ENCOUNTER — Other Ambulatory Visit (HOSPITAL_COMMUNITY): Payer: Self-pay

## 2024-03-25 ENCOUNTER — Other Ambulatory Visit (HOSPITAL_COMMUNITY): Payer: Self-pay

## 2024-03-25 MED ORDER — TRAMADOL HCL 50 MG PO TABS
50.0000 mg | ORAL_TABLET | Freq: Every day | ORAL | 1 refills | Status: DC | PRN
  Filled 2024-03-25: qty 30, 30d supply, fill #0

## 2024-04-07 ENCOUNTER — Other Ambulatory Visit: Payer: Self-pay

## 2024-04-07 ENCOUNTER — Other Ambulatory Visit (HOSPITAL_COMMUNITY): Payer: Self-pay

## 2024-04-28 ENCOUNTER — Ambulatory Visit: Payer: Self-pay

## 2024-04-28 DIAGNOSIS — H25811 Combined forms of age-related cataract, right eye: Secondary | ICD-10-CM | POA: Diagnosis not present

## 2024-04-28 NOTE — Telephone Encounter (Signed)
 FYI Only or Action Required?: FYI only for provider.  Patient was last seen in primary care on 10/24/2023 by [name], MD.  Called Nurse Triage reporting Urinary Incontinence.  Symptoms began chronic.  Interventions attempted: Other: na.  Symptoms are: na.  Triage Disposition: See PCP Within 2 Weeks  Patient/caregiver understands and will follow disposition?: Yes   Copied from CRM #[id number]. Topic: Clinical - Red Word Triage >> Apr 28, 2024 11:27 AM [name] wrote: [facility] that prompted transfer to Nurse Triage: denied scheduling   pt calling to make an apt with dr crazwford says she needs her incontinence supplies, need prescriptions for medications veliphaxine and pain medicince tramadol  she would like to discuss changing it. Pt was denied scheduling due to her saying the reason for her coming in is getting worst. I was routed to NT pt Reason for Disposition  Requesting regular office appointment  Answer Assessment - Initial Assessment Questions 1. REASON FOR CALL: What is the main reason for your call? or How can I best help you?     Called to schedule follow up with pcp, decision tree denied scheduling by PAS due to patient stating yes to worsening conditions.    Patient states her symptoms are not worsening she is having difficulty obtaining her incontinence supplies due to needing an appointment for the order. She would also like to discuss her pain medication as she feels it is not as effective as it had been. Patient denies acute symptoms to triage at this time.   Follow up appointment with pcp scheduled on preferred date.  Protocols used: Information Only Call - No Triage-A-AH

## 2024-04-29 NOTE — Telephone Encounter (Signed)
 I have not received a form for incontinence supplies / pt is also scheduled to come in on the 30 th any further advice

## 2024-04-29 NOTE — Telephone Encounter (Signed)
 Can discuss at visit

## 2024-05-05 ENCOUNTER — Encounter: Payer: Self-pay | Admitting: Internal Medicine

## 2024-05-05 ENCOUNTER — Other Ambulatory Visit: Payer: Self-pay

## 2024-05-05 ENCOUNTER — Other Ambulatory Visit (HOSPITAL_COMMUNITY): Payer: Self-pay

## 2024-05-05 ENCOUNTER — Ambulatory Visit: Payer: Self-pay | Admitting: Internal Medicine

## 2024-05-05 ENCOUNTER — Telehealth: Payer: Self-pay

## 2024-05-05 ENCOUNTER — Other Ambulatory Visit: Payer: Self-pay | Admitting: Hematology and Oncology

## 2024-05-05 ENCOUNTER — Ambulatory Visit (INDEPENDENT_AMBULATORY_CARE_PROVIDER_SITE_OTHER): Admitting: Internal Medicine

## 2024-05-05 VITALS — BP 128/82 | HR 88 | Temp 98.6°F | Ht 68.5 in | Wt 209.0 lb

## 2024-05-05 DIAGNOSIS — E785 Hyperlipidemia, unspecified: Secondary | ICD-10-CM | POA: Diagnosis not present

## 2024-05-05 DIAGNOSIS — Z23 Encounter for immunization: Secondary | ICD-10-CM

## 2024-05-05 DIAGNOSIS — C50211 Malignant neoplasm of upper-inner quadrant of right female breast: Secondary | ICD-10-CM

## 2024-05-05 DIAGNOSIS — C50011 Malignant neoplasm of nipple and areola, right female breast: Secondary | ICD-10-CM

## 2024-05-05 DIAGNOSIS — G629 Polyneuropathy, unspecified: Secondary | ICD-10-CM

## 2024-05-05 DIAGNOSIS — Z7984 Long term (current) use of oral hypoglycemic drugs: Secondary | ICD-10-CM

## 2024-05-05 DIAGNOSIS — K047 Periapical abscess without sinus: Secondary | ICD-10-CM | POA: Diagnosis not present

## 2024-05-05 DIAGNOSIS — B009 Herpesviral infection, unspecified: Secondary | ICD-10-CM

## 2024-05-05 DIAGNOSIS — M5416 Radiculopathy, lumbar region: Secondary | ICD-10-CM | POA: Diagnosis not present

## 2024-05-05 DIAGNOSIS — Z853 Personal history of malignant neoplasm of breast: Secondary | ICD-10-CM

## 2024-05-05 DIAGNOSIS — Z7985 Long-term (current) use of injectable non-insulin antidiabetic drugs: Secondary | ICD-10-CM | POA: Diagnosis not present

## 2024-05-05 DIAGNOSIS — E119 Type 2 diabetes mellitus without complications: Secondary | ICD-10-CM

## 2024-05-05 DIAGNOSIS — E1169 Type 2 diabetes mellitus with other specified complication: Secondary | ICD-10-CM

## 2024-05-05 DIAGNOSIS — N3941 Urge incontinence: Secondary | ICD-10-CM

## 2024-05-05 LAB — COMPREHENSIVE METABOLIC PANEL WITH GFR
ALT: 16 U/L (ref 0–35)
AST: 15 U/L (ref 0–37)
Albumin: 4.2 g/dL (ref 3.5–5.2)
Alkaline Phosphatase: 81 U/L (ref 39–117)
BUN: 16 mg/dL (ref 6–23)
CO2: 30 meq/L (ref 19–32)
Calcium: 10 mg/dL (ref 8.4–10.5)
Chloride: 102 meq/L (ref 96–112)
Creatinine, Ser: 0.75 mg/dL (ref 0.40–1.20)
GFR: 84.98 mL/min (ref >60.00)
Glucose, Bld: 87 mg/dL (ref 70–99)
Potassium: 3.5 meq/L (ref 3.5–5.1)
Sodium: 139 meq/L (ref 135–145)
Total Bilirubin: 0.4 mg/dL (ref 0.2–1.2)
Total Protein: 8 g/dL (ref 6.0–8.3)

## 2024-05-05 LAB — HEMOGLOBIN A1C: Hgb A1c MFr Bld: 5.7 % (ref 4.6–6.5)

## 2024-05-05 MED ORDER — TRAMADOL HCL 50 MG PO TABS
50.0000 mg | ORAL_TABLET | Freq: Every day | ORAL | 5 refills | Status: AC | PRN
  Filled 2024-05-05: qty 30, 30d supply, fill #0
  Filled 2024-06-02: qty 30, 30d supply, fill #1
  Filled 2024-07-06: qty 30, 30d supply, fill #2
  Filled 2024-08-12: qty 30, 30d supply, fill #3
  Filled 2024-09-10: qty 30, 30d supply, fill #4

## 2024-05-05 MED ORDER — AMOXICILLIN-POT CLAVULANATE 875-125 MG PO TABS
1.0000 | ORAL_TABLET | Freq: Two times a day (BID) | ORAL | 0 refills | Status: AC
  Filled 2024-05-05: qty 14, 7d supply, fill #0

## 2024-05-05 MED ORDER — ATORVASTATIN CALCIUM 20 MG PO TABS
20.0000 mg | ORAL_TABLET | Freq: Every day | ORAL | 3 refills | Status: AC
  Filled 2024-05-05: qty 90, 90d supply, fill #0
  Filled 2024-07-06 – 2024-08-04 (×2): qty 90, 90d supply, fill #1

## 2024-05-05 MED ORDER — CYCLOBENZAPRINE HCL 5 MG PO TABS
5.0000 mg | ORAL_TABLET | Freq: Three times a day (TID) | ORAL | 3 refills | Status: AC | PRN
  Filled 2024-05-05: qty 90, 30d supply, fill #0
  Filled 2024-06-01: qty 90, 30d supply, fill #1
  Filled 2024-07-06: qty 90, 30d supply, fill #2
  Filled 2024-08-04: qty 90, 30d supply, fill #3

## 2024-05-05 MED ORDER — GABAPENTIN 300 MG PO CAPS
600.0000 mg | ORAL_CAPSULE | Freq: Three times a day (TID) | ORAL | 2 refills | Status: DC
  Filled 2024-05-05: qty 180, 30d supply, fill #0
  Filled 2024-06-03: qty 180, 30d supply, fill #1
  Filled 2024-07-06: qty 180, 30d supply, fill #2

## 2024-05-05 MED ORDER — VALACYCLOVIR HCL 1 G PO TABS
1000.0000 mg | ORAL_TABLET | Freq: Two times a day (BID) | ORAL | 3 refills | Status: AC
  Filled 2024-05-05: qty 180, 90d supply, fill #0
  Filled 2024-07-06 – 2024-08-04 (×2): qty 180, 90d supply, fill #1

## 2024-05-05 MED ORDER — VENLAFAXINE HCL ER 150 MG PO CP24
150.0000 mg | ORAL_CAPSULE | Freq: Every day | ORAL | 3 refills | Status: AC
  Filled 2024-05-05 – 2024-06-01 (×2): qty 90, 90d supply, fill #0
  Filled 2024-07-06 – 2024-09-03 (×2): qty 90, 90d supply, fill #1

## 2024-05-05 NOTE — Assessment & Plan Note (Signed)
 Diabetes is well-controlled with Ozempic  and glimepiride . Recent weight loss and good blood sugar levels suggest potential for medication adjustment. Recheck blood sugar levels and, if she remains well-controlled, discontinue glimepiride  while continuing Ozempic .

## 2024-05-05 NOTE — Assessment & Plan Note (Signed)
 refill valtrex  which she takes preventatively 1000 mg daily.

## 2024-05-05 NOTE — Progress Notes (Signed)
 Subjective:   Patient ID: Margaret [name], female    DOB: [birth year], 63 y.o.   MRN: [id number]  Discussed the use of AI scribe software for clinical note transcription with the patient, who gave verbal consent to proceed. History of Present Illness Margaret [name] is a 63 year old female who presents for pain management and medication review.  She experiences chronic back pain, persistent since her hospitalization last year, and reports feeling a noticeable 'hump' in her back. Despite taking pain medication in increments, she continues to experience significant discomfort and is seeking a prescription refill. She has lost fifty pounds since last year, attributing some of her back pain to the weight loss due to a lack of 'cushion'.  She suspects an oral abscess despite having no teeth. 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  She is blind in one eye and is scheduled for cataract removal surgery next week. She experiences balance issues and has speculated that her vision problems and gallbladder removal may be related.  She experiences gastrointestinal issues following gallbladder removal, including fear of eating and needing to use the bathroom urgently. She wears protective undergarments to manage incontinence.  She reports that her blood sugars have been good while taking her current diabetes medications, including Ozempic  and glimepiride . She occasionally experiences morning confusion and sweating, which she manages by eating a bagel or similar food.  She experiences nighttime leg discomfort, which is alleviated by wearing compression socks. She works part-time on weekends and finds the socks help with morning discomfort. She wears compression stockings for work and reports ankle swelling and balance issues.  She has recently regained Medicaid, which has facilitated her upcoming eye surgery. She has not  experienced new stomach pains but reports daily pain exacerbated by weather changes.  Review of Systems  Constitutional:  Positive for activity change.  HENT: Negative.    Eyes: Negative.   Respiratory:  Negative for cough, chest tightness and shortness of breath.   Cardiovascular:  Negative for chest pain, palpitations and leg swelling.  Gastrointestinal:  Negative for abdominal distention, abdominal pain, constipation, diarrhea, nausea and vomiting.  Musculoskeletal:  Positive for arthralgias and back pain.  Skin: Negative.   Neurological: Negative.   Psychiatric/Behavioral: Negative.      Objective:  Physical Exam Constitutional:      Appearance: She is well-developed.  HENT:     Head: Normocephalic and atraumatic.  Cardiovascular:     Rate and Rhythm: Normal rate and regular rhythm.  Pulmonary:     Effort: Pulmonary effort is normal. No respiratory distress.     Breath sounds: Normal breath sounds. No wheezing or rales.  Abdominal:     General: Bowel sounds are normal. There is no distension.     Palpations: Abdomen is soft.     Tenderness: There is no abdominal tenderness.  Musculoskeletal:        General: Tenderness present.     Cervical back: Normal range of motion.  Skin:    General: Skin is warm and dry.     Comments: Foot exam done  Neurological:     Mental Status: She is alert and oriented to person, place, and time.     Coordination: Coordination abnormal.     Vitals:   05/05/24 0958  BP: 128/82  Pulse: 88  Temp: 98.6 F (37 C)  TempSrc: Oral  SpO2: 98%  Weight: 209 lb (94.8 kg)  Height: 5' 8.5 (1.74 m)   Flu shot given at visit  Assessment and Plan Assessment & Plan Chronic back pain   Chronic back pain is likely due to previous cancer treatment and spinal changes, exacerbated by weight loss.   Edentulism with oral abscess   An oral abscess persists despite edentulism, possibly due to jawbone issues or clenching. Prescribe antibiotics  augmentin  1 week for the abscess   Herpes simplex: refill valtrex  which she takes preventatively 1000 mg daily.  Urinary and fecal incontinence   Incontinence may be worsened by weight loss and dietary changes following gallbladder removal. Ensure Medicaid covers necessary supplies and she does require due to condition.   Type 2 diabetes mellitus   Diabetes is well-controlled with Ozempic  and glimepiride . Recent weight loss and good blood sugar levels suggest potential for medication adjustment. Recheck blood sugar levels and, if she remains well-controlled, discontinue glimepiride  while continuing Ozempic .  Peripheral neuropathy, unspecified   Nighttime leg discomfort and balance issues may be related to peripheral neuropathy. Advise continuation of compression stockings for symptom relief.  General Health Maintenance   She is proactive about flu and COVID-19 vaccinations. Administer the flu shot during the visit and encourage continuation of COVID-19 vaccinations at a local pharmacy.

## 2024-05-05 NOTE — Assessment & Plan Note (Signed)
 Nighttime leg discomfort and balance issues may be related to peripheral neuropathy. Advise continuation of compression stockings for symptom relief.

## 2024-05-05 NOTE — Assessment & Plan Note (Signed)
 She had not filled lipitor in some time. Refilled today and recommended she resume 20 mg daily.

## 2024-05-05 NOTE — Patient Instructions (Addendum)
 If the sugars are doing good we will plan to stop glimepiride .   We will check the labs today and have sent in the medicine. We will get the forms for supplies.   We have sent in augmentin  to take 1 pill twice a day for 1 week.

## 2024-05-05 NOTE — Assessment & Plan Note (Signed)
 Incontinence may be worsened by weight loss and dietary changes following gallbladder removal. Ensure Medicaid covers necessary supplies and she does require due to condition.

## 2024-05-05 NOTE — Assessment & Plan Note (Signed)
 An oral abscess persists despite edentulism in the area, possibly due to jawbone issues or clenching. Prescribe antibiotics augmentin  1 week for the abscess

## 2024-05-06 NOTE — Telephone Encounter (Signed)
 Also patient forms for [employer] supplies have been sent back in and fax was successful

## 2024-05-06 NOTE — Telephone Encounter (Signed)
 Yes should take I have refilled.

## 2024-05-07 ENCOUNTER — Telehealth: Payer: Self-pay | Admitting: *Deleted

## 2024-05-07 ENCOUNTER — Encounter: Payer: Self-pay | Admitting: *Deleted

## 2024-05-07 ENCOUNTER — Inpatient Hospital Stay: Payer: 59 | Attending: Hematology and Oncology | Admitting: Hematology and Oncology

## 2024-05-07 VITALS — BP 141/78 | HR 98 | Temp 97.9°F | Resp 17 | Wt 210.4 lb

## 2024-05-07 DIAGNOSIS — Z87891 Personal history of nicotine dependence: Secondary | ICD-10-CM | POA: Diagnosis not present

## 2024-05-07 DIAGNOSIS — Z170 Estrogen receptor positive status [ER+]: Secondary | ICD-10-CM

## 2024-05-07 DIAGNOSIS — Z9013 Acquired absence of bilateral breasts and nipples: Secondary | ICD-10-CM | POA: Insufficient documentation

## 2024-05-07 DIAGNOSIS — Z9221 Personal history of antineoplastic chemotherapy: Secondary | ICD-10-CM | POA: Insufficient documentation

## 2024-05-07 DIAGNOSIS — C50412 Malignant neoplasm of upper-outer quadrant of left female breast: Secondary | ICD-10-CM

## 2024-05-07 DIAGNOSIS — Z08 Encounter for follow-up examination after completed treatment for malignant neoplasm: Secondary | ICD-10-CM | POA: Diagnosis present

## 2024-05-07 DIAGNOSIS — G629 Polyneuropathy, unspecified: Secondary | ICD-10-CM | POA: Diagnosis not present

## 2024-05-07 DIAGNOSIS — Z853 Personal history of malignant neoplasm of breast: Secondary | ICD-10-CM | POA: Insufficient documentation

## 2024-05-07 DIAGNOSIS — R5383 Other fatigue: Secondary | ICD-10-CM | POA: Diagnosis not present

## 2024-05-07 NOTE — Research (Signed)
 [facility] [id number] [facility] - Month 96 visit  Patient into clinic today unaccompanied for routine follow-up visit.    PROs- Follow-up questionnaires provided to patient at beginning of visit and then collected and checked for completeness and accuracy prior to MD appointment.  Patient checked don't know and yes to taking an aromatase inhibitor and tamoxifen.  Clarified with patient that she is not currently taking either of these medications as they were discontinued a few years ago.  Patient confirmed, she thought the question was asking if she has ever taken those meds. Patient also completed section 3 even though she is not currently taking these medications. Informed patient she did not have to answer this section and she states she was answering based on how she used to take the medications. Patient answered no to question 8 although her medication list shows Semaglutide  (Ozempic ) and patient confirmed she is currently taking this medication. Answers as clarified above were corrected by patient.   Disease status/outcome assessment - See MD note from Dr. Loretha today.  Weight and waist and hip circumference measurements - Obtained per protocol and following the instructions in the [facility] Weight and Height Protocol document. Waist and hip measured in centimeters.  Weight; 95.4 kg and 95.2 kg.  Waist;  109 cm and 110 cm Hip; 122.5 cm and 123 cm  Plan- Patient understands that she will continue in the follow-up phase including ongoing measurements and H&Ps to occur annually until she completes 10 years on this study. Patient agrees to this plan. Thanked patient for her study participation. Encouraged her to call research nurse if any questions/concerns prior to next visit. She verbalized understanding.   Cherylyn Hoard, BSN, RN, [employer] Research Nurse II [phone] 05/07/2024

## 2024-05-07 NOTE — Research (Signed)
 05/07/2024   AFT-05 [facility] - Follow-Up Phase I Month 96  Patient into clinic today unaccompanied for routine follow-up visit.   Anti-cancer medications - Patient states she is not on any new anti-cancer medications.     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 Dr. Loretha states this is not related to study drug or the research study.     Disease monitoring - See MD note from Dr. Loretha today.   COVID-19 - She denies any COVID-19 infection since last study visit. She has not had any Covid-19 vaccines since last study visit.   Plan- Patient will continue in Follow Up Phase 2 on study with annual follow-up.  This will be done in conjunction with the [facility] study annual visits. Patient agreed with this plan. Thanked patient for her participation in this research study. Encouraged her to call research nurse if any questions/concerns prior to next visit. She verbalized understanding.  Cherylyn Hoard, BSN, RN, [employer] Research Nurse II [phone] 05/07/2024

## 2024-05-07 NOTE — Telephone Encounter (Signed)
 Dr. Loretha ordered labs for patient to have drawn after her appointment today. This research nurse noticed later that the lab appointment was not made. Called patient and she states she waited for the lab but then left without talking to anyone after waiting a long time. Apologized to patient that the lab appointment was not scheduled and offered to schedule a lab appointment for her to have the labs drawn at her convenience. Patient stated she could not make the appointment now but will call back to schedule the appointment.  Cherylyn Hoard, BSN, RN, [employer] Research Nurse II [phone] 05/07/2024

## 2024-05-07 NOTE — Progress Notes (Signed)
 [facility]  Telephone:([phone] Fax:([phone]) [phone]   ID: Margaret [name] DOB: August 20, 1960  MR#: [id number]  RDW#:[id number]  Patient Care Team: [name], MD as PCP - General (Internal Medicine) Curvin Deward MOULD, MD as Consulting Physician (General Surgery) Shannon Agent, MD as Consulting Physician (Radiation Oncology) Tyree Nanetta SAILOR, RN as Registered Nurse Chick Venetia BRAVO, MD as Consulting Physician (Family Medicine) Fate Morna SAILOR, [facility] (Inactive) as Pharmacist (Pharmacist) Loretha Ash, MD as Consulting Physician (Hematology and Oncology) OTHER MD:  CHIEF COMPLAINT:  estrogen receptor positive breast cancer; (s/p bilateral mastectomies)  CURRENT TREATMENT: observation  INTERVAL HISTORY:  Margaret [name] returns today for follow-up of her estrogen receptor positive breast cancer.  She is here for an annual visit, she participated in the [place] trial and received 2 years of adjuvant palbociclib  with 5 years of anastrozole .  History of Present Illness Margaret [name] is a 63 year old female with a history of breast cancer who presents with fatigue and generalized body aches.  She experiences increased fatigue despite adequate sleep. She works two days a week, from 6:45 AM to 10 PM, which she believes contributes to her fatigue. No recent weight loss or depression.  She has generalized body aches, describing her bones and everything as aching. Her legs 'jump at night', and she attributes some discomfort to her age. She is currently taking gabapentin  for neuropathy, which provides some relief, but not consistently.  She has a history of breast cancer and reports ongoing chest pain and tenderness, which she associates with her previous cancer treatment. The pain is persistent, and tramadol  has not been effective in managing it.  In November, she was hospitalized for meningitis and underwent emergency gallbladder surgery. She is still in the recovery phase  from these events.  She recently had a toothache-like sensation in an area where she has no teeth, which was treated with antibiotics after her dentist noted redness, possibly from a scratch.  She is participating in the PALLAS and BWELL studies, currently in year eight of follow-up. She is not on any new medications except for a recent course of antibiotics for her mouth issue.  Rest of the pertinent 10 point ROS reviewed and negative  COVID 19 VACCINATION STATUS: Pfizer x3, most recently 07/2020   BREAST CANCER HISTORY: From the original intake note:  Margaret [name]'s primary care physician retired sometime ago and her health maintenance has not been up-to-date. She has been receiving medical care through the emergency room and was seen in March 2015 following a head injury, then in August 2015 for lancing of an abscess. In December 2015 she noticed that her left breast looked a little bit smaller than her right. She brought this to the attention of friends and family over the next several months but the general feeling was that most people are not perfectly symmetrical. By the summer of this year she noticed that her nipple on the left was going in. More recently she started developing pain in the left breast. She was evaluated for this in the emergency room 04/09/2015 at which time a left breast exam showed a deformed left breast with a large hard mass encompassing most of the breast, with nipple retraction. There was no nipple discharge or bleeding and no palpable adenopathy.  The patient was referred to [facility] and on 04/13/2015 she underwent bilateral diagnostic mammography with tomosynthesis as well as bilateral breast ultrasound. The breast density was category B. In the right breast at the 11:00 position  there was a 1.3 cm irregular mass which by ultrasonography measured 1.3 cm.--In the left breast there was a 4 cm irregular mass at the 2:00 position associated with nipple retraction. There was a  second, 1.7 cm area of architectural distortion at the 9:00 position. Both were palpable. By ultrasonography, the 4 cm irregular mass was noted, with a second mass measuring 2.5 cm by ultrasonography. Both axillae were benign.  On 04/19/2015 the patient underwent right breast upper outer quadrant biopsy, showing ([id number]) and invasive ductal carcinoma, grade 1, estrogen receptor 90% positive, progesterone receptor negative, with an MIB-1 of 5%, and no HER-2 amplification, the signals ratio being 1.26 and the number per cell 2.20.  On the same day, she underwent biopsy of the 2 left breast masses in question as well as a suspicious left axillary lymph node. All 3 biopsies showed invasive ductal carcinoma, grade 2, estrogen receptor 80-100% positive, progesterone receptor 80-100% positive, with the MIB-1 between 5 and 10%, and no HER-2 amplification, the signals ratio being between 1.05 and 1.13, and the number per cell between 2.10 and 2.25.  The patient's subsequent history is as detailed below   PAST MEDICAL HISTORY: Past Medical History:  Diagnosis Date   Allergy    Anxiety    Arthritis    Bilateral breast cancer (HCC)    Breast cancer (HCC)    Breast cancer of upper-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) 04/21/2015   Depression    Diabetes mellitus without complication (HCC)    GERD (gastroesophageal reflux disease)    Hypertension     PAST SURGICAL HISTORY: Past Surgical History:  Procedure Laterality Date   ABDOMINAL HYSTERECTOMY  1990   CHOLECYSTECTOMY N/A 07/05/2023   Procedure: LAPAROSCOPIC CHOLECYSTECTOMY;  Surgeon: Kinsinger, Herlene Righter, MD;  Location: [facility] OR;  Service: General;  Laterality: N/A;   MASTECTOMY MODIFIED RADICAL Left 05/09/2015   MASTECTOMY MODIFIED RADICAL Left 05/09/2015   Procedure: LEFT MODIFIED RADICAL MASTETCTOMY;  Surgeon: Deward Null III, MD;  Location: [facility] OR;  Service: General;  Laterality: Left;   MASTECTOMY W/ SENTINEL NODE BIOPSY Right     MASTECTOMY W/ SENTINEL NODE BIOPSY Right 05/09/2015   Procedure: RIGHT MASTECTOMY WITH RIGHT AXILLARY SENTINEL LYMPH NODE BIOPSY;  Surgeon: Deward Null III, MD;  Location: [facility] OR;  Service: General;  Laterality: Right;   PORTACATH PLACEMENT Right 09/01/2015   Procedure: INSERTION PORT-A-CATH;  Surgeon: Deward Null III, MD;  Location: [facility];  Service: General;  Laterality: Right;   TUBAL LIGATION  1984    FAMILY HISTORY Family History  Problem Relation Age of Onset   Hypertension Mother    Diabetes Mother    Colon cancer Neg Hx    Esophageal cancer Neg Hx    Rectal cancer Neg Hx    Stomach cancer Neg Hx     the patient has little information on her father. Her mother is living, currently age 81. She has one brother, 2 sisters. There is no history of breast or ovarian cancer in the family to her knowledge.    GYNECOLOGIC HISTORY:  No LMP recorded. Patient has had a hysterectomy.  menarche age 33, first live birth age 75. She is GX P4. She underwent a total abdominal hysterectomy with bilateral salpingo-oophorectomy at age 82. She did not take hormone replacement. She never used oral contraceptives.    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 She is single and lives alone  The patient's daughter, Shona Pouch, works as a Water engineer, as does her daughter Suanne Hillier. Daughter Shawanda Bee works at [employer], as a call. All 3 live in [place] . The patient's son Georganna Bowersox's lives in [place]. He prepares sets for shows The patient has 4 grandchildren, no great grandchildren. She attends a [employer] in [place].    ADVANCED DIRECTIVES: Not in place. At the initial clinic visit the patient was given the appropriate forms to complete and notarize at her discretion. The patient intends to name her  daughter Wallie Pouch as healthcare power of attorney. She can be reached at [phone].   HEALTH MAINTENANCE: Social History   Tobacco Use   Smoking status: Former    Current packs/day: 0.00    Average packs/day: 0.1 packs/day for 28.0 years (2.8 ttl pk-yrs)    Types: Cigarettes    Start date: 09/02/1990    Quit date: 09/02/2018    Years since quitting: 5.6   Smokeless tobacco: Never  Vaping Use   Vaping status: Never Used  Substance Use Topics   Alcohol use: Not Currently    Comment: Socially   Drug use: No     Colonoscopy: Never  PAP: Status post hysterectomy  Bone density: Never  Lipid panel:  Allergies  Allergen Reactions   Banana Hives    Tongue itching   Latex Itching and Other (See Comments)    burning   Peanuts [Peanut Oil] Hives    Patient is allergic to all tree nuts   Wheat Hives    Current Outpatient Medications  Medication Sig Dispense Refill   amoxicillin -clavulanate (AUGMENTIN ) 875-125 MG tablet Take 1 tablet by mouth 2 (two) times daily for 7 days. 14 tablet 0   atorvastatin  (LIPITOR) 20 MG tablet Take 1 tablet (20 mg total) by mouth daily. 90 tablet 3   Blood Glucose Monitoring Suppl (BLOOD GLUCOSE MONITOR SYSTEM) w/Device KIT Use to test blood sugar 3 times a day 1 kit 0   busPIRone  (BUSPAR ) 5 MG tablet Take 1 tablet (5 mg total) by mouth daily as needed. 30 tablet 2   clobetasol  cream (TEMOVATE ) 0.05 % Apply 1 Application topically 2 (two) times daily. 30 g 3   cyclobenzaprine  (FLEXERIL ) 5 MG tablet Take 1 tablet (5 mg total) by mouth 3 (three) times daily as needed for muscle spasms. 90 tablet 3   gabapentin  (NEURONTIN ) 300 MG capsule Take 2 capsules (600 mg total) by mouth 3 (three) times daily. 180 capsule 2   glucose blood test strip Use to test blood sugar 2 times a day 100 each 0   Lancets (ONETOUCH DELICA PLUS LANCET33G) MISC Use to check blood sugars twice a day 100 each 0   meloxicam  (MOBIC ) 15 MG tablet Take 1 tablet (15 mg total) by  mouth daily. 30 tablet 3   Olmesartan -amLODIPine -HCTZ 20-5-12.5 MG TABS Take 1 tablet by mouth daily. 90 tablet 3   pantoprazole  (PROTONIX ) 40 MG tablet TAKE 1 TABLET BY MOUTH ONCE DAILY 90 tablet 3   potassium chloride  SA (KLOR-CON  M) 20 MEQ tablet Take 1 tablet (20 mEq total) by mouth 2 (two) times daily. 90 tablet 0   Semaglutide , 2 MG/DOSE, (OZEMPIC , 2 MG/DOSE,) 8 MG/3ML SOPN Inject 2 mg as directed once a week. 9 mL 1   traMADol  (ULTRAM ) 50 MG tablet Take 1 tablet (50 mg total) by mouth daily as  needed. 30 tablet 5   traZODone  (DESYREL ) 100 MG tablet Take 1 tablet (100 mg total) by mouth at bedtime. 90 tablet 3   valACYclovir  (VALTREX ) 1000 MG tablet Take 1 tablet (1,000 mg total) by mouth 2 (two) times daily. 180 tablet 3   venlafaxine  XR (EFFEXOR -XR) 150 MG 24 hr capsule Take 1 capsule (150 mg total) by mouth daily with breakfast. 90 capsule 3   No current facility-administered medications for this visit.     OBJECTIVE:  African-American woman who appears stated age  Vitals:   05/07/24 1132  BP: (!) 141/78  Pulse: 98  Resp: 17  Temp: 97.9 F (36.6 C)  SpO2: 96%   Wt Readings from Last 3 Encounters:  05/07/24 210 lb 6.4 oz (95.4 kg)  05/05/24 209 lb (94.8 kg)  10/24/23 217 lb (98.4 kg)   Body mass index is 31.53 kg/m.    ECOG FS:1 - Symptomatic but completely ambulatory  Physical Exam Constitutional:      Appearance: Normal appearance.  Cardiovascular:     Rate and Rhythm: Normal rate and regular rhythm.  Pulmonary:     Effort: Pulmonary effort is normal.     Breath sounds: Normal breath sounds.  Chest:     Comments: She is status post bilateral mastectomy.  No concern for recurrence. Musculoskeletal:        General: No swelling.     Cervical back: Normal range of motion and neck supple. No rigidity.  Lymphadenopathy:     Cervical: No cervical adenopathy.  Neurological:     Mental Status: She is alert.       LAB RESULTS:  CMP     Component Value  Date/Time   NA 139 05/05/2024 1043   NA 142 07/24/2017 1407   K 3.5 05/05/2024 1043   K 3.3 (L) 07/24/2017 1407   CL 102 05/05/2024 1043   CO2 30 05/05/2024 1043   CO2 26 07/24/2017 1407   GLUCOSE 87 05/05/2024 1043   GLUCOSE 110 07/24/2017 1407   BUN 16 05/05/2024 1043   BUN 13.4 07/24/2017 1407   CREATININE 0.75 05/05/2024 1043   CREATININE 0.81 03/15/2022 1223   CREATININE 1.0 07/24/2017 1407   CALCIUM  10.0 05/05/2024 1043   CALCIUM  9.6 07/24/2017 1407   PROT 8.0 05/05/2024 1043   PROT 8.5 (H) 07/24/2017 1407   ALBUMIN 4.2 05/05/2024 1043   ALBUMIN 3.9 07/24/2017 1407   AST 15 05/05/2024 1043   AST 15 03/15/2022 1223   AST 23 07/24/2017 1407   ALT 16 05/05/2024 1043   ALT 20 03/15/2022 1223   ALT 28 07/24/2017 1407   ALKPHOS 81 05/05/2024 1043   ALKPHOS 84 07/24/2017 1407   BILITOT 0.4 05/05/2024 1043   BILITOT 0.3 03/15/2022 1223   BILITOT 0.30 07/24/2017 1407   GFRNONAA >60 07/08/2023 0401   GFRNONAA >60 03/15/2022 1223   GFRAA >60 03/10/2020 0919    INo results found for: SPEP, UPEP  Lab Results  Component Value Date   WBC 9.8 10/24/2023   NEUTROABS 13.5 (H) 07/02/2023   HGB 13.5 10/24/2023   HCT 40.6 10/24/2023   MCV 95.3 10/24/2023   PLT 239.0 10/24/2023      Chemistry      Component Value Date/Time   NA 139 05/05/2024 1043   NA 142 07/24/2017 1407   K 3.5 05/05/2024 1043   K 3.3 (L) 07/24/2017 1407   CL 102 05/05/2024 1043   CO2 30 05/05/2024 1043   CO2 26 07/24/2017  1407   BUN 16 05/05/2024 1043   BUN 13.4 07/24/2017 1407   CREATININE 0.75 05/05/2024 1043   CREATININE 0.81 03/15/2022 1223   CREATININE 1.0 07/24/2017 1407   GLU 157 (H) 08/17/2016 0818      Component Value Date/Time   CALCIUM  10.0 05/05/2024 1043   CALCIUM  9.6 07/24/2017 1407   ALKPHOS 81 05/05/2024 1043   ALKPHOS 84 07/24/2017 1407   AST 15 05/05/2024 1043   AST 15 03/15/2022 1223   AST 23 07/24/2017 1407   ALT 16 05/05/2024 1043   ALT 20 03/15/2022 1223   ALT  28 07/24/2017 1407   BILITOT 0.4 05/05/2024 1043   BILITOT 0.3 03/15/2022 1223   BILITOT 0.30 07/24/2017 1407       No results found for: LABCA2  No components found for: LABCA125  No results for input(s): INR in the last 168 hours.  Urinalysis    Component Value Date/Time   COLORURINE YELLOW 07/02/2023 0034   APPEARANCEUR CLEAR 07/02/2023 0034   LABSPEC >1.046 (H) 07/02/2023 0034   PHURINE 6.0 07/02/2023 0034   GLUCOSEU NEGATIVE 07/02/2023 0034   GLUCOSEU NEGATIVE 12/11/2022 1103   HGBUR NEGATIVE 07/02/2023 0034   BILIRUBINUR NEGATIVE 07/02/2023 0034   KETONESUR 20 (A) 07/02/2023 0034   PROTEINUR 30 (A) 07/02/2023 0034   UROBILINOGEN 1.0 12/11/2022 1103   NITRITE NEGATIVE 07/02/2023 0034   LEUKOCYTESUR NEGATIVE 07/02/2023 0034    STUDIES: No results found.   ASSESSMENT: 63 y.o. Margaret [name] woman with synchronous breast cancers, as follows  (a) status post right breast upper outer quadrant biopsy 04/19/2015 for a clinical pT1c N0, stage IA invasive ductal carcinoma, grade 1, estrogen receptor positive, progesterone receptor negative, with an MIB-1 of 5%, and no HER-2 amplification  (b) status post left breast upper outer quadrant biopsy 2 and left axillary lymph node biopsy 04/19/2015 for a clinical T3 N1, stage IIIA invasive ductal carcinoma, grade 1 or 2, estrogen receptor and progesterone receptor positive, HER-2 negative, with an MIB-1 between 5 and 10%  (1) status post bilateral mastectomies 05/09/2015, showing:   (a) on the right side, a pT2 pN0, stage IIA invasive ductal carcinoma, with negative margins and repeat HER-2 again negative    (b) on the left, a pT3 pN2, stage IIIA invasive ductal carcinoma, grade 2, with negative margins, and repeat HER-2 again negative  (2) Mammaprint from the left-sided tumor returned luminal type, low risk, predicting a small benefit from chemotherapy with the important caveat that N2 disease was not included in the [place]  study--patient opted for chemotherapy  (3) Oncotype from the right-sided tumor showed a score of 12, predicting an 8% risk of recurrence outside the breast within the next 10 years, if the patient's only systemic therapy was tamoxifen for 5 years. It also predicted no significant benefit from chemotherapy  (4) postmastectomy radiation to the left chest wall completed 08/15/2015  (5) adjuvant chemotherapy consisting of cyclophosphamide  and docetaxel  x4 completed on 11/21/15.  (5) anastrozole  started 02/08/2016, completed 5 years August 2022  (6) left upper extremity lymphedema  (a) negative Doppler study 11/02/2015  (7) enrolled in B-WELL study 02/08/2016, follow-up for 10 years planned  (8) participating in PALLAS trial as of August 2017, 10-year follow-up planned  (a) randomized to palbociclib , starting October 2017  (b) completed 2 years of palbociclib  October 2019, with no dose reductions necessary Completed 5 yrs of anastrozole    PLAN:  Assessment and Plan Assessment & Plan Breast cancer, post-treatment surveillance In year eight of  follow-up, participating in P[place] and BWELL studies. Chest tenderness likely related to previous treatment. - Continue annual follow-up per clinical trial - No concern for recurrence based on ROS or PE.  Neuropathic pain and peripheral neuropathy Neuropathic pain in legs and hips, partially managed with gabapentin . - Continue gabapentin  for neuropathic pain. - Recommend capsaicin cream for topical pain relief.  Fatigue Increased fatigue not explained by recent blood work. Differential includes anemia, thyroid  dysfunction, vitamin B12 deficiency, and vitamin D  deficiency. Long weekend shifts may contribute. - Order CBC to check for anemia. - Order thyroid  function test. - Order vitamin B12 level.  Time spent: 30 min  *Total Encounter Time as defined by the Centers for Medicare and Medicaid Services includes, in addition to the face-to-face  time of a patient visit (documented in the note above) non-face-to-face time: obtaining and reviewing outside history, ordering and reviewing medications, tests or procedures, care coordination (communications with other health care professionals or caregivers) and documentation in the medical record.

## 2024-05-08 ENCOUNTER — Other Ambulatory Visit (HOSPITAL_COMMUNITY): Payer: Self-pay

## 2024-05-08 ENCOUNTER — Other Ambulatory Visit (HOSPITAL_BASED_OUTPATIENT_CLINIC_OR_DEPARTMENT_OTHER): Payer: Self-pay

## 2024-05-08 MED ORDER — PREDNISOLONE ACETATE 1 % OP SUSP
1.0000 [drp] | Freq: Three times a day (TID) | OPHTHALMIC | 0 refills | Status: AC
  Filled 2024-05-08: qty 10, 67d supply, fill #0

## 2024-05-13 DIAGNOSIS — H268 Other specified cataract: Secondary | ICD-10-CM | POA: Diagnosis not present

## 2024-05-13 DIAGNOSIS — H25811 Combined forms of age-related cataract, right eye: Secondary | ICD-10-CM | POA: Diagnosis not present

## 2024-05-13 DIAGNOSIS — E1136 Type 2 diabetes mellitus with diabetic cataract: Secondary | ICD-10-CM | POA: Diagnosis not present

## 2024-05-15 ENCOUNTER — Telehealth: Payer: Self-pay

## 2024-05-15 NOTE — Telephone Encounter (Signed)
 Copied from CRM [id number]. Topic: Clinical - Medical Advice >> May 15, 2024 10:26 AM Anairis L wrote: Reason for CRM: Incontinence supply fax was sent in and no response has been sent back to them. [employer] is the company reaching out.

## 2024-05-20 NOTE — Telephone Encounter (Signed)
 This was already fax back over to [employer] fax was successful

## 2024-05-21 ENCOUNTER — Inpatient Hospital Stay

## 2024-05-21 ENCOUNTER — Other Ambulatory Visit (HOSPITAL_COMMUNITY): Payer: Self-pay

## 2024-05-21 ENCOUNTER — Inpatient Hospital Stay: Admitting: Hematology and Oncology

## 2024-06-01 ENCOUNTER — Other Ambulatory Visit (HOSPITAL_COMMUNITY): Payer: Self-pay

## 2024-06-01 ENCOUNTER — Other Ambulatory Visit: Payer: Self-pay

## 2024-06-01 ENCOUNTER — Other Ambulatory Visit: Payer: Self-pay | Admitting: Internal Medicine

## 2024-06-01 DIAGNOSIS — C50412 Malignant neoplasm of upper-outer quadrant of left female breast: Secondary | ICD-10-CM

## 2024-06-01 MED ORDER — PANTOPRAZOLE SODIUM 40 MG PO TBEC
40.0000 mg | DELAYED_RELEASE_TABLET | Freq: Every day | ORAL | 3 refills | Status: AC
  Filled 2024-06-01: qty 90, 90d supply, fill #0
  Filled 2024-07-06 – 2024-09-03 (×2): qty 90, 90d supply, fill #1

## 2024-06-02 ENCOUNTER — Other Ambulatory Visit: Payer: Self-pay

## 2024-06-05 NOTE — Telephone Encounter (Unsigned)
 Copied from CRM #[id number]. Topic: Clinical - Order For Equipment >> Jun 05, 2024  2:17 PM Alfonso HERO wrote: Reason for CRM: [employer] emailing patient about not receiving documents faxed over for the patients incontinence supplies. Please follow up with [employer] and the patient.

## 2024-06-09 ENCOUNTER — Other Ambulatory Visit (HOSPITAL_COMMUNITY): Payer: Self-pay

## 2024-06-09 ENCOUNTER — Other Ambulatory Visit: Payer: Self-pay

## 2024-06-10 NOTE — Telephone Encounter (Signed)
 Spoke with pt and she has not received supplies from [facility]. Called [employer] and paperwork was received but need to have [employer] of Incontinence for supplies. They will fax over a new request to be filled out and faxed back.   Spoke with pt again and informed her of this update. Will contact her once this has been resolved.

## 2024-06-11 NOTE — Telephone Encounter (Signed)
 Original documents were faxed on 05/05/24 by Lex, CMA. Indication of diagnosis was not seen in original copies. Reprinted copies that support urinary incontinence as per discussed with [employer] on 06/10/24 was included in the documentation. Refaxed current and original copies on 06/11/24@1115am .  Pt was notified that documentation has bee refaxed and confirmation was received. Pt advised to call us  if she is not contacted by [employer].

## 2024-06-22 ENCOUNTER — Encounter: Payer: Self-pay | Admitting: *Deleted

## 2024-06-22 NOTE — Progress Notes (Signed)
 Margaret [name]                                          MRN: [id number]   06/22/2024   The [facility] Quality Team Specialist reviewed this patient medical record for the purposes of chart review for care gap closure. The following were reviewed: abstraction for care gap closure-kidney health evaluation for diabetes:eGFR  and uACR.    [facility] Quality Team

## 2024-07-06 ENCOUNTER — Other Ambulatory Visit (HOSPITAL_COMMUNITY): Payer: Self-pay

## 2024-07-06 ENCOUNTER — Other Ambulatory Visit: Payer: Self-pay | Admitting: Internal Medicine

## 2024-07-06 ENCOUNTER — Other Ambulatory Visit: Payer: Self-pay

## 2024-07-06 MED ORDER — POTASSIUM CHLORIDE CRYS ER 20 MEQ PO TBCR
20.0000 meq | EXTENDED_RELEASE_TABLET | Freq: Two times a day (BID) | ORAL | 0 refills | Status: AC
  Filled 2024-07-06: qty 90, 45d supply, fill #0

## 2024-07-21 ENCOUNTER — Encounter: Payer: Self-pay | Admitting: *Deleted

## 2024-07-21 NOTE — Progress Notes (Signed)
 Margaret [name]                                          MRN: [id number]   07/21/2024   The [facility] Quality Team Specialist reviewed this patient medical record for the purposes of chart review for care gap closure. The following were reviewed: abstraction for care gap closure-glycemic status assessment.    [facility] Quality Team

## 2024-08-04 ENCOUNTER — Other Ambulatory Visit: Payer: Self-pay | Admitting: Hematology and Oncology

## 2024-08-04 ENCOUNTER — Other Ambulatory Visit (HOSPITAL_COMMUNITY): Payer: Self-pay

## 2024-08-04 DIAGNOSIS — G629 Polyneuropathy, unspecified: Secondary | ICD-10-CM

## 2024-08-04 DIAGNOSIS — C50011 Malignant neoplasm of nipple and areola, right female breast: Secondary | ICD-10-CM

## 2024-08-05 ENCOUNTER — Other Ambulatory Visit (HOSPITAL_COMMUNITY): Payer: Self-pay

## 2024-08-05 ENCOUNTER — Other Ambulatory Visit: Payer: Self-pay

## 2024-08-05 MED ORDER — GABAPENTIN 300 MG PO CAPS
600.0000 mg | ORAL_CAPSULE | Freq: Three times a day (TID) | ORAL | 2 refills | Status: AC
  Filled 2024-08-05: qty 180, 30d supply, fill #0
  Filled 2024-09-10: qty 180, 30d supply, fill #1

## 2024-08-13 ENCOUNTER — Other Ambulatory Visit (HOSPITAL_COMMUNITY): Payer: Self-pay

## 2024-08-24 ENCOUNTER — Other Ambulatory Visit: Payer: Self-pay | Admitting: Internal Medicine

## 2024-08-25 ENCOUNTER — Other Ambulatory Visit (HOSPITAL_COMMUNITY): Payer: Self-pay

## 2024-08-25 MED ORDER — OZEMPIC (2 MG/DOSE) 8 MG/3ML ~~LOC~~ SOPN
2.0000 mg | PEN_INJECTOR | SUBCUTANEOUS | 1 refills | Status: AC
  Filled 2024-08-25 – 2024-09-03 (×2): qty 9, 84d supply, fill #0

## 2024-09-03 ENCOUNTER — Other Ambulatory Visit: Payer: Self-pay

## 2024-09-03 ENCOUNTER — Other Ambulatory Visit (HOSPITAL_COMMUNITY): Payer: Self-pay

## 2024-09-10 ENCOUNTER — Other Ambulatory Visit (HOSPITAL_COMMUNITY): Payer: Self-pay

## 2024-09-10 ENCOUNTER — Other Ambulatory Visit: Payer: Self-pay

## 2024-09-24 ENCOUNTER — Ambulatory Visit: Payer: Medicare Other

## 2024-09-28 ENCOUNTER — Ambulatory Visit

## 2025-05-21 ENCOUNTER — Inpatient Hospital Stay: Admitting: Hematology and Oncology

## 2025-05-21 ENCOUNTER — Inpatient Hospital Stay
